# Patient Record
Sex: Male | Born: 1950
Health system: Southern US, Community
[De-identification: ages and names within clinical notes are randomized; demographics above are authoritative.]

## PROBLEM LIST (undated history)

## (undated) DIAGNOSIS — I82409 Acute embolism and thrombosis of unspecified deep veins of unspecified lower extremity: Secondary | ICD-10-CM

## (undated) DIAGNOSIS — I714 Abdominal aortic aneurysm, without rupture, unspecified: Secondary | ICD-10-CM

## (undated) DIAGNOSIS — E559 Vitamin D deficiency, unspecified: Secondary | ICD-10-CM

## (undated) DIAGNOSIS — I7101 Dissection of thoracic aorta: Principal | ICD-10-CM

## (undated) DIAGNOSIS — K219 Gastro-esophageal reflux disease without esophagitis: Secondary | ICD-10-CM

## (undated) DIAGNOSIS — J309 Allergic rhinitis, unspecified: Secondary | ICD-10-CM

## (undated) DIAGNOSIS — G540 Brachial plexus disorders: Secondary | ICD-10-CM

## (undated) DIAGNOSIS — E041 Nontoxic single thyroid nodule: Secondary | ICD-10-CM

## (undated) DIAGNOSIS — N529 Male erectile dysfunction, unspecified: Secondary | ICD-10-CM

## (undated) DIAGNOSIS — G43909 Migraine, unspecified, not intractable, without status migrainosus: Secondary | ICD-10-CM

## (undated) DIAGNOSIS — G4733 Obstructive sleep apnea (adult) (pediatric): Secondary | ICD-10-CM

## (undated) DIAGNOSIS — F419 Anxiety disorder, unspecified: Secondary | ICD-10-CM

## (undated) DIAGNOSIS — M1990 Unspecified osteoarthritis, unspecified site: Secondary | ICD-10-CM

## (undated) DIAGNOSIS — E119 Type 2 diabetes mellitus without complications: Secondary | ICD-10-CM

## (undated) DIAGNOSIS — D649 Anemia, unspecified: Secondary | ICD-10-CM

## (undated) DIAGNOSIS — J449 Chronic obstructive pulmonary disease, unspecified: Secondary | ICD-10-CM

## (undated) DIAGNOSIS — I10 Essential (primary) hypertension: Secondary | ICD-10-CM

## (undated) DIAGNOSIS — E785 Hyperlipidemia, unspecified: Secondary | ICD-10-CM

## (undated) HISTORY — DX: Dissection of thoracic aorta: I71.01

## (undated) HISTORY — DX: Vitamin D deficiency, unspecified: E55.9

## (undated) HISTORY — DX: Abdominal aortic aneurysm, without rupture: I71.4

## (undated) HISTORY — DX: Brachial plexus disorders: G54.0

## (undated) HISTORY — DX: Type 2 diabetes mellitus without complications: E11.9

## (undated) HISTORY — DX: Nontoxic single thyroid nodule: E04.1

## (undated) HISTORY — DX: Anxiety disorder, unspecified: F41.9

## (undated) HISTORY — DX: Male erectile dysfunction, unspecified: N52.9

## (undated) HISTORY — PX: OTHER SURGICAL HISTORY: SHX169

## (undated) HISTORY — DX: Abdominal aortic aneurysm, without rupture, unspecified: I71.40

## (undated) HISTORY — DX: Obstructive sleep apnea (adult) (pediatric): G47.33

## (undated) HISTORY — DX: Hyperlipidemia, unspecified: E78.5

## (undated) HISTORY — DX: Allergic rhinitis, unspecified: J30.9

## (undated) HISTORY — DX: Migraine, unspecified, not intractable, without status migrainosus: G43.909

---

## 2001-03-27 ENCOUNTER — Encounter: Admission: RE | Admit: 2001-03-27 | Discharge: 2001-03-27 | Payer: Self-pay | Admitting: Internal Medicine

## 2003-06-17 ENCOUNTER — Encounter: Admission: RE | Admit: 2003-06-17 | Discharge: 2003-06-17 | Payer: Self-pay | Admitting: Infectious Diseases

## 2006-02-27 ENCOUNTER — Emergency Department (HOSPITAL_COMMUNITY): Admission: EM | Admit: 2006-02-27 | Discharge: 2006-02-27 | Payer: Self-pay | Admitting: Family Medicine

## 2007-12-07 LAB — CONVERTED CEMR LAB
HCT: 46.1 %
Hemoglobin: 15.6 g/dL
MCV: 96 fL
RBC: 4.83 M/uL

## 2008-02-22 ENCOUNTER — Ambulatory Visit (HOSPITAL_BASED_OUTPATIENT_CLINIC_OR_DEPARTMENT_OTHER): Admission: RE | Admit: 2008-02-22 | Discharge: 2008-02-22 | Payer: Self-pay | Admitting: Internal Medicine

## 2008-02-22 ENCOUNTER — Encounter: Payer: Self-pay | Admitting: Neurology

## 2008-02-23 ENCOUNTER — Encounter: Payer: Self-pay | Admitting: Internal Medicine

## 2008-02-23 ENCOUNTER — Encounter: Payer: Self-pay | Admitting: Neurology

## 2008-03-02 ENCOUNTER — Ambulatory Visit: Payer: Self-pay | Admitting: Internal Medicine

## 2008-06-20 LAB — CONVERTED CEMR LAB
ALT: 41 units/L
AST: 28 units/L
BUN: 19 mg/dL
CO2: 22 meq/L
Chloride: 102 meq/L
Cholesterol: 243 mg/dL
HDL: 38 mg/dL
LDL (calc): 36 mg/dL
LDL Cholesterol: 169 mg/dL
Potassium: 4.8 meq/L
Total Protein: 7 g/dL
Triglyceride fasting, serum: 181 mg/dL

## 2008-12-17 ENCOUNTER — Encounter (INDEPENDENT_AMBULATORY_CARE_PROVIDER_SITE_OTHER): Payer: Self-pay | Admitting: *Deleted

## 2008-12-17 ENCOUNTER — Ambulatory Visit: Payer: Self-pay | Admitting: Internal Medicine

## 2008-12-17 DIAGNOSIS — G43909 Migraine, unspecified, not intractable, without status migrainosus: Secondary | ICD-10-CM | POA: Insufficient documentation

## 2008-12-17 DIAGNOSIS — R5383 Other fatigue: Secondary | ICD-10-CM

## 2008-12-17 DIAGNOSIS — J45909 Unspecified asthma, uncomplicated: Secondary | ICD-10-CM | POA: Insufficient documentation

## 2008-12-17 DIAGNOSIS — E785 Hyperlipidemia, unspecified: Secondary | ICD-10-CM

## 2008-12-17 DIAGNOSIS — Z9189 Other specified personal risk factors, not elsewhere classified: Secondary | ICD-10-CM | POA: Insufficient documentation

## 2008-12-17 DIAGNOSIS — G4733 Obstructive sleep apnea (adult) (pediatric): Secondary | ICD-10-CM | POA: Insufficient documentation

## 2008-12-17 DIAGNOSIS — N529 Male erectile dysfunction, unspecified: Secondary | ICD-10-CM | POA: Insufficient documentation

## 2008-12-17 DIAGNOSIS — E1169 Type 2 diabetes mellitus with other specified complication: Secondary | ICD-10-CM | POA: Insufficient documentation

## 2008-12-17 DIAGNOSIS — R5381 Other malaise: Secondary | ICD-10-CM | POA: Insufficient documentation

## 2008-12-17 DIAGNOSIS — J309 Allergic rhinitis, unspecified: Secondary | ICD-10-CM | POA: Insufficient documentation

## 2008-12-17 DIAGNOSIS — E559 Vitamin D deficiency, unspecified: Secondary | ICD-10-CM | POA: Insufficient documentation

## 2008-12-18 ENCOUNTER — Encounter (INDEPENDENT_AMBULATORY_CARE_PROVIDER_SITE_OTHER): Payer: Self-pay | Admitting: *Deleted

## 2008-12-18 LAB — CONVERTED CEMR LAB
Basophils Absolute: 0.1 10*3/uL (ref 0.0–0.1)
Eosinophils Absolute: 0.2 10*3/uL (ref 0.0–0.7)
Hemoglobin: 16.3 g/dL (ref 13.0–17.0)
Lymphocytes Relative: 33.5 % (ref 12.0–46.0)
Lymphs Abs: 2.7 10*3/uL (ref 0.7–4.0)
MCHC: 34.4 g/dL (ref 30.0–36.0)
Neutro Abs: 4.3 10*3/uL (ref 1.4–7.7)
RDW: 12.6 % (ref 11.5–14.6)

## 2009-01-17 ENCOUNTER — Ambulatory Visit: Payer: Self-pay | Admitting: Internal Medicine

## 2009-03-06 ENCOUNTER — Encounter: Payer: Self-pay | Admitting: Internal Medicine

## 2009-03-27 ENCOUNTER — Telehealth: Payer: Self-pay | Admitting: Internal Medicine

## 2009-07-18 ENCOUNTER — Telehealth: Payer: Self-pay | Admitting: Internal Medicine

## 2009-11-17 ENCOUNTER — Telehealth: Payer: Self-pay | Admitting: Internal Medicine

## 2010-01-02 ENCOUNTER — Ambulatory Visit: Payer: Self-pay | Admitting: Internal Medicine

## 2010-01-02 LAB — CONVERTED CEMR LAB: Vit D, 25-Hydroxy: 38 ng/mL (ref 30–89)

## 2010-01-05 LAB — CONVERTED CEMR LAB
ALT: 40 units/L (ref 0–53)
AST: 27 units/L (ref 0–37)
Albumin: 4.3 g/dL (ref 3.5–5.2)
BUN: 17 mg/dL (ref 6–23)
Basophils Relative: 0.5 % (ref 0.0–3.0)
Bilirubin Urine: NEGATIVE
CO2: 28 meq/L (ref 19–32)
Chloride: 105 meq/L (ref 96–112)
Cholesterol: 155 mg/dL (ref 0–200)
Creatinine, Ser: 0.9 mg/dL (ref 0.4–1.5)
Eosinophils Relative: 3.6 % (ref 0.0–5.0)
Hemoglobin, Urine: NEGATIVE
Hemoglobin: 15.6 g/dL (ref 13.0–17.0)
Ketones, ur: NEGATIVE mg/dL
LDL Cholesterol: 94 mg/dL (ref 0–99)
Lymphocytes Relative: 39.8 % (ref 12.0–46.0)
Monocytes Relative: 8.9 % (ref 3.0–12.0)
Neutro Abs: 3.8 10*3/uL (ref 1.4–7.7)
Nitrite: NEGATIVE
RBC: 4.66 M/uL (ref 4.22–5.81)
Total Bilirubin: 0.9 mg/dL (ref 0.3–1.2)
Total CHOL/HDL Ratio: 4
Total Protein, Urine: NEGATIVE mg/dL

## 2010-02-17 ENCOUNTER — Telehealth: Payer: Self-pay | Admitting: Internal Medicine

## 2010-03-20 ENCOUNTER — Telehealth: Payer: Self-pay | Admitting: Internal Medicine

## 2010-03-21 ENCOUNTER — Encounter: Payer: Self-pay | Admitting: Internal Medicine

## 2010-03-23 LAB — CONVERTED CEMR LAB
Direct LDL: 114 mg/dL — ABNORMAL HIGH
HDL: 41 mg/dL (ref >39)

## 2010-07-14 NOTE — Progress Notes (Signed)
Summary: lipid panell  Phone Note Call from Patient   Caller: Patient e-mail  Call For: Newt Lukes MD Summary of Call: MD recieved e-mail from pt stating I've been taking the red yeast supplement for over a month and nearing the end of the supply. Any chance i could get a lipid profile to see if HDL increased? would be great if he copuld go to the [facility] to have blood drawn.  Initial call taken by: Orlan Leavens RMA,  March 20, 2010 9:55 AM  Follow-up for Phone Call        MD states would be ok if able to place in IDX. will call pt to get more info Follow-up by: Orlan Leavens RMA,  March 20, 2010 9:55 AM  Additional Follow-up for Phone Call Additional follow up Details #1::        Pt call concerning getting order for lipid panel. [facility] does use EMR will put order in computer. dx 272.4 Additional Follow-up by: Orlan Leavens RMA,  March 20, 2010 10:27 AM     Appended Document: lipid panell    Clinical Lists Changes  Orders: Added new Test order of TLB-Cholesterol, HDL (83718-HDL) - Signed Added new Test order of TLB-Cholesterol, Direct LDL (83721-DIRLDL) - Signed

## 2010-07-14 NOTE — Assessment & Plan Note (Signed)
Summary: cpx / # / umr / # / cd   Vital Signs:  Patient profile:   60 year old male Height:      76 inches (193.04 cm) Weight:      241.12 pounds (109.60 kg) BMI:     29.46 O2 Sat:      94 % on Room air Temp:     98.3 degrees F (36.83 degrees C) oral Pulse rate:   74 / minute BP sitting:   122 / 94  (left arm) Cuff size:   large  Vitals Entered By: Orlan Leavens RMA (January 02, 2010 2:57 PM)  O2 Flow:  Room air CC: CPX Is Patient Diabetic? No Pain Assessment Patient in pain? no      Comments Pt states he already had EKG done already. Also discuss prednisone rx   Primary Care Provider:  Rene Paci, MD  CC:  CPX.  History of Present Illness: patient is here today for annual physical. Patient feels well and has no complaints.   reviewed other issues: sleep apnea - sleep study 9/09 with moderate sleep apnea reviewed. However, patient has had problems with CPAP -he still feels fatigued and tired during the day - naps when he gets home from work  seasonal allergies - reasonable control with current meds -takes his nasal spray, and allergy pills as rx'd. Occasional vertigo will be associated when ears are stopped from sinus problems. No sneezing or current recent symptoms.  epipens? - hx allergy to yellow jackets and would like supply of epi pen to have at home  dyslipidemia - prev on lovaza and liptior tx for same - currently taking expired sampes of vytroin tabs- 1/2 tab of 10/40 strength. stopped briefly, due to myalgias in neck, but these resolved spontaneously and since resuming statin medication, he has had no other muscle pain  vit d defic - level <20 6/09 when dx made. taking otc supplements as rx'd  Preventive Screening-Counseling & Management  Alcohol-Tobacco     Alcohol drinks/day: <1     Smoking Status: current     Tobacco Counseling: to quit use of tobacco products  Caffeine-Diet-Exercise     Nutrition Referrals: no     Exercise Counseling: to improve  exercise regimen  Safety-Violence-Falls     Seat Belt Counseling: not indicated; patient wears seat belts     Helmet Counseling: not indicated; patient wears helmet when riding bicycle/motocycle     Smoke Detectors: yes     Violence in the Home: no risk noted     Fall Risk Counseling: not indicated; no significant falls noted  Clinical Review Panels:  Immunizations   Last Tetanus Booster:  Historical (02/27/2006)   Last Flu Vaccine:  Historical (03/22/2008)   Last H1N1 Vaccine 1:  H1N1 vaccine G code (04/25/2008)   Last PPD Date Read:  04/29/2008 (12/17/2008)   Last PPD Result:  negative (12/17/2008)  Lipid Management   Cholesterol:  243 (06/20/2008)   LDL (bad choesterol):  169 (06/20/2008)   HDL (good cholesterol):  38 (06/20/2008)   Triglycerides:  181 (06/20/2008)  Diabetes Management   HgBA1C:  6.2 (06/20/2008)   Creatinine:  17 (06/20/2008)   Last Flu Vaccine:  Historical (03/22/2008)  CBC   WBC:  8.1 (12/17/2008)   RBC:  4.96 (12/17/2008)   Hgb:  16.3 (12/17/2008)   Hct:  47.4 (12/17/2008)   Platelets:  179.0 (12/17/2008)   MCV  95.5 (12/17/2008)   MCHC  34.4 (12/17/2008)   RDW  12.6 (  12/17/2008)   PMN:  54.0 (12/17/2008)   Lymphs:  33.5 (12/17/2008)   Monos:  9.5 (12/17/2008)   Eosinophils:  2.0 (12/17/2008)   Basophil:  1.0 (12/17/2008)  Complete Metabolic Panel   Glucose:  127 (06/20/2008)   Sodium:  138 (06/20/2008)   Potassium:  4.8 (06/20/2008)   Chloride:  102 (06/20/2008)   CO2:  22 (06/20/2008)   BUN:  19 (06/20/2008)   Creatinine:  17 (06/20/2008)   Albumin:  4.4 (06/20/2008)   Total Protein:  7 (06/20/2008)   Calcium:  9 (06/20/2008)   Total Bili:  1.2 (06/20/2008)   Alk Phos:  40 (06/20/2008)   SGPT (ALT):  41 (06/20/2008)   SGOT (AST):  28 (06/20/2008)   Current Medications (verified): 1)  Xanax 1 Mg Tabs (Alprazolam) .... Take 1/4 As Needed 2)  Sudophed 15 Mg Tabs (Pseudoephedrine Hcl) .... Take 1 By Mouth Qd 3)  Allegra 180 Mg Tabs  (Fexofenadine Hcl) .... Take 1 By Mouth Qd 4)  Singulair 10 Mg Tabs (Montelukast Sodium) .... Take 1 By Mouth Qd 5)  Flonase 50 Mcg/act Susp (Fluticasone Propionate) .... Use 1 Spray Each Nostril Prn 6)  Vytorin 10-40 Mg Tabs (Ezetimibe-Simvastatin) .... Take 1/2 By Mouth Qd 7)  Multivitamins  Tabs (Multiple Vitamin) .... Take 1 By Mouth Qd 8)  Vitamin D 1000 Unit Tabs (Cholecalciferol) .... Take 1 By Mouth Qd 9)  B Complex 100  Tabs (B Complex Vitamins) .... Take 1 By Mouth Qd 10)  Epipen 0.3 Mg/0.59ml (1:1000) Devi (Epinephrine Hcl (Anaphylaxis)) .... Use Prn 11)  Cialis 2.5 Mg Tabs (Tadalafil) .... Take 30 Minutes Prior To Sexual Encounter As Needed 12)  Lovaza 1 Gm Caps (Omega-3-Acid Ethyl Esters) .... Take 1 By Mouth Two Times A Day 13)  Cpap 14)  Prednisone 20 Mg Tabs (Prednisone) .... Take 1 By Mouth Daily As Needed For Asthma Flare-Up  Allergies (verified): No Known Drug Allergies  Past History:  Past Medical History: Allergic rhinitis Asthma Hyperlipidemia OSA- NPSG 02/23/08- 34/hr.  MD roster: pulm - young  Past Surgical History: Nasal fx without repair  Family History: Family History Diabetes 1st degree relative (mother) Hx Heart disease (cousins and uncles) Family History Lung cancer (2 cousins) dad died age 52y d/t PE   Social History: Current Smoker-12-15 cigs/day single,  divorced lives alone, prev g-friend x 6 years moved to [place] early 2011 works in internal med residency dept -Civil engineer, contracting at [employer]  Review of Systems       see HPI above. I have reviewed all other systems and they were negative.   Physical Exam  General:  alert, well-developed, well-nourished, and cooperative to examination.    Eyes:  vision grossly intact; pupils equal, round and reactive to light.  conjunctiva and lids normal.    Ears:  normal pinnae bilaterally, without erythema, swelling, or tenderness to palpation. TMs clear, without effusion, or cerumen impaction. Hearing grossly  normal bilaterally  Mouth:  teeth and gums in good repair; mucous membranes moist, without lesions or ulcers. oropharynx clear without exudate, no erythema.  Neck:  supple, full ROM, no masses, no thyromegaly; no thyroid nodules or tenderness. no JVD or carotid bruits.   Lungs:  normal respiratory effort, no intercostal retractions or use of accessory muscles; normal breath sounds bilaterally - no crackles and no wheezes.    Heart:  normal rate, regular rhythm, no murmur, and no rub. BLE without edema. Abdomen:  soft, non-tender, normal bowel sounds, no distention; no masses and  no appreciable hepatomegaly or splenomegaly.   Rectal:  defer Genitalia:  defer Prostate:  defer Msk:  No deformity or scoliosis noted of thoracic or lumbar spine.   Neurologic:  alert & oriented X3 and cranial nerves II-XII symetrically intact.  strength normal in all extremities, sensation intact to light touch, and gait normal. speech fluent without dysarthria or aphasia; follows commands with good comprehension.  Skin:  no rashes, vesicles, ulcers, or erythema. No nodules or irregularity to palpation.  Psych:  Oriented X3, memory intact for recent and remote, normally interactive, good eye contact, not anxious appearing, not depressed appearing, and not agitated.      Impression & Recommendations:  Problem # 1:  PREVENTIVE HEALTH CARE (ICD-V70.0) Patient has been counseled on age-appropriate routine health concerns for screening and prevention. These are reviewed and up-to-date. Immunizations are up-to-date or declined. Labs ordered and ECG declined (recent unoffical ekg done at work at "normal" per pt report) Orders: TLB-Lipid Panel (80061-LIPID) TLB-CBC Platelet - w/Differential (85025-CBCD) TLB-BMP (Basic Metabolic Panel-BMET) (80048-METABOL) TLB-TSH (Thyroid Stimulating Hormone) (84443-TSH) TLB-Hepatic/Liver Function Pnl (80076-HEPATIC) TLB-Udip w/ Micro (81001-URINE) TLB-PSA (Prostate Specific Antigen)  (84153-PSA)  Problem # 2:  UNSPECIFIED VITAMIN D DEFICIENCY (ICD-268.9)  Orders: T-Vitamin D (25-Hydroxy) (40981-19147)   Continue current recommended dose without change. Pending these test results  Problem # 3:  ALLERGIC RHINITIS (ICD-477.9)  His updated medication list for this problem includes:    Sudafed 30 Mg Tabs (Pseudoephedrine hcl) .[name]... 1/2-1 tab by mouth once daily    Allegra 180 Mg Tabs (Fexofenadine hcl) .[name]... Take 1 by mouth once daily    Flonase 50 Mcg/act Susp (Fluticasone propionate) ..... Use 1 spray each nostril prn  Tobacco makes this worse. Discussed available meds.  Complete Medication List: 1)  Xanax 1 Mg Tabs (Alprazolam) .... Take 1/4 as needed 2)  Sudafed 30 Mg Tabs (Pseudoephedrine hcl) .... 1/2-1 tab by mouth once daily 3)  Allegra 180 Mg Tabs (Fexofenadine hcl) .... Take 1 by mouth once daily 4)  Singulair 10 Mg Tabs (Montelukast sodium) .... Take 1 by mouth once daily 5)  Flonase 50 Mcg/act Susp (Fluticasone propionate) .... Use 1 spray each nostril prn 6)  Vytorin 10-40 Mg Tabs (Ezetimibe-simvastatin) .... Take 1/2 by mouth qd 7)  Multivitamins Tabs (Multiple vitamin) .... Take 1 by mouth qd 8)  Vitamin D 1000 Unit Tabs (Cholecalciferol) .... Take 1 by mouth qd 9)  B Complex 100 Tabs (B complex vitamins) .... Take 1 by mouth qd 10)  Epipen 0.3 Mg/0.67ml (1:1000) Devi (Epinephrine hcl (anaphylaxis)) .... Use prn 11)  Lovaza 1 Gm Caps (Omega-3-acid ethyl esters) .... Take 1 by mouth two times a day 12)  Cpap  13)  Prednisone 20 Mg Tabs (Prednisone) .... Take 1 by mouth daily as needed for asthma flare-up 14)  Tretinoin 0.1 % Crea (Tretinoin) .... Apply as needed to affected skin  Patient Instructions: 1)  it was good to see you today. 2)  test(s) ordered today - your results will be posted on the phone tree for review in 48-72 hours from the time of test completion; call [phone] and enter your 9 digit MRN (listed above on this page, just below  your name); if any changes need to be made or there are abnormal results, you will be contacted directly.  3)  refills on your medications as discussed - 4)  Please schedule a follow-up appointment annually, call sooner if problems.   Contraindications/Deferment of Procedures/Staging:    Test/Procedure: Colonoscopy  Reason for deferment: patient declined  Prescriptions: LOVAZA 1 GM CAPS (OMEGA-3-ACID ETHYL ESTERS) take 1 by mouth two times a day  #180 x 3   Entered and Authorized by:   Newt Lukes MD   Signed by:   Newt Lukes MD on 01/02/2010   Method used:   Electronically to        [facility]* (retail)       [address]       [address] Shipping/mailing       [place], [place]  [address]       Ph: [phone]       Fax: [phone]   RxID:   [id number] SINGULAIR 10 MG TABS (MONTELUKAST SODIUM) take 1 by mouth once daily  #90 x 3   Entered and Authorized by:   Newt Lukes MD   Signed by:   Newt Lukes MD on 01/02/2010   Method used:   Electronically to        [facility]* (retail)       [address]       [address] Shipping/mailing       [place], [place]  [address]       Ph: [phone]       Fax: [phone]   RxID:   [id number] ALLEGRA 180 MG TABS (FEXOFENADINE HCL) take 1 by mouth once daily  #90 x 3   Entered and Authorized by:   Newt Lukes MD   Signed by:   Newt Lukes MD on 01/02/2010   Method used:   Electronically to        [facility]* (retail)       [address]       [address] Shipping/mailing       [place], [place]  [address]       Ph: [phone]       Fax: [phone]   RxID:   [id number] TRETINOIN 0.1 % CREA (TRETINOIN) apply as needed to affected skin  #20g x 1   Entered and Authorized by:   Newt Lukes MD   Signed by:   Newt Lukes MD on 01/02/2010   Method used:   Electronically to        [facility]* (retail)       [address]       [address] Shipping/mailing       [place], [place]  [address]       Ph: [phone]       Fax: [phone]   RxID:   [id number]

## 2010-07-14 NOTE — Progress Notes (Signed)
Summary: prednisone  Phone Note From Pharmacy   Caller: [facility]* Summary of Call: Pt is requesting refill on Prednisone 20mg  # 15 for asthma flare up. Was rx by Dr. Lina Sayre. Dr. Felicity Coyer is this ok to refill? [facility] Initial call taken by: Orlan Leavens,  July 18, 2009 11:23 AM  Follow-up for Phone Call        yes - ok to fill Follow-up by: Newt Lukes MD,  July 18, 2009 11:40 AM  Additional Follow-up for Phone Call Additional follow up Details #1::        sent to [facility] Additional Follow-up by: Orlan Leavens,  July 18, 2009 11:47 AM    New/Updated Medications: PREDNISONE 20 MG TABS (PREDNISONE) take 1 by mouth daily as needed for asthma flare-up Prescriptions: PREDNISONE 20 MG TABS (PREDNISONE) take 1 by mouth daily as needed for asthma flare-up  #15 x 0   Entered by:   Orlan Leavens   Authorized by:   Newt Lukes MD   Signed by:   Orlan Leavens on 07/18/2009   Method used:   Electronically to        [facility]* (retail)       [address]       [address] Shipping/mailing       [place], [place]  [address]       Ph: [phone]       Fax: [phone]   RxID:   [id number]

## 2010-07-14 NOTE — Progress Notes (Signed)
Summary: flonase & singualr  Phone Note Refill Request Message from:  Fax from Pharmacy on November 17, 2009 11:53 AM  Refills Requested: Medication #1:  FLONASE 50 MCG/ACT SUSP use 1 spray each nostril prn  Medication #2:  SINGULAIR 10 MG TABS take 1 by mouth qd Initial call taken by: Orlan Leavens,  November 17, 2009 11:54 AM    Prescriptions: SINGULAIR 10 MG TABS (MONTELUKAST SODIUM) take 1 by mouth qd  #90 x 0   Entered by:   Orlan Leavens   Authorized by:   Newt Lukes MD   Signed by:   Orlan Leavens on 11/17/2009   Method used:   Electronically to        [facility]* (retail)       [address]       [address] Shipping/mailing       [place], [place]  [address]       Ph: [phone]       Fax: [phone]   RxID:   [id number] FLONASE 50 MCG/ACT SUSP (FLUTICASONE PROPIONATE) use 1 spray each nostril prn  #16 x 0   Entered by:   Orlan Leavens   Authorized by:   Newt Lukes MD   Signed by:   Orlan Leavens on 11/17/2009   Method used:   Electronically to        [facility]* (retail)       [address]       [address] Shipping/mailing       [place], [place]  [address]       Ph: [phone]       Fax: [phone]   RxID:   [id number]

## 2010-07-14 NOTE — Progress Notes (Signed)
Summary: flonase & prednisone  Phone Note Refill Request   Refills Requested: Medication #1:  PREDNISONE 20 MG TABS take 1 by mouth daily as needed for asthma flare-up  Medication #2:  FLONASE 50 MCG/ACT SUSP use 1 spray each nostril prn [facility] [phone]   Method Requested: Electronic Initial call taken by: Orlan Leavens RMA,  February 17, 2010 9:57 AM    Prescriptions: PREDNISONE 20 MG TABS (PREDNISONE) take 1 by mouth daily as needed for asthma flare-up  #15 x 0   Entered by:   Orlan Leavens RMA   Authorized by:   Newt Lukes MD   Signed by:   Orlan Leavens RMA on 02/17/2010   Method used:   Electronically to        [facility]* (retail)       [address]       [address] Shipping/mailing       [place], [place]  [address]       Ph: [phone]       Fax: [phone]   RxID:   [id number] FLONASE 50 MCG/ACT SUSP (FLUTICASONE PROPIONATE) use 1 spray each nostril prn  #16 Each x 0   Entered by:   Orlan Leavens RMA   Authorized by:   Newt Lukes MD   Signed by:   Orlan Leavens RMA on 02/17/2010   Method used:   Electronically to        [facility]* (retail)       [address]       [address] Shipping/mailing       [place], [place]  [address]       Ph: [phone]       Fax: [phone]   RxID:   [id number]

## 2010-09-04 ENCOUNTER — Other Ambulatory Visit: Payer: Self-pay | Admitting: Internal Medicine

## 2010-10-30 ENCOUNTER — Other Ambulatory Visit: Payer: Self-pay | Admitting: Internal Medicine

## 2010-10-30 NOTE — Procedures (Signed)
NAME:  [name], DOWIS NO.:  [id number]   MEDICAL RECORD NO.:  [id number]          PATIENT TYPE:  OUT   LOCATION:  SLEEP CENTER                 FACILITY:  [facility]   PHYSICIAN:  Clinton D. Maple Hudson, MD, FCCP, FACPDATE OF BIRTH:  Mar 27, 1953   DATE OF STUDY:  02/23/2008                            NOCTURNAL POLYSOMNOGRAM   REFERRING PHYSICIAN:   REFERRING PHYSICIAN:  Merlene Laughter. Renae Gloss, M.D.   INDICATION FOR STUDY:  Insomnia with sleep apnea.   EPWORTH SLEEPINESS SCORE:  5/24.   BMI:  29.8.   WEIGHT:  245 pounds.   HEIGHT:  76 inches.   NECK:  17 inches.   HOME MEDICATIONS:  Charted and reviewed, noted to include  pseudoephedrine and prednisone, both of which may cause nocturnal  stimulation.   SLEEP ARCHITECTURE:  Total sleep time 263 minutes with sleep efficiency  65.8%.  Stage I was 13.3%.  Stage II 78.8%.  Stage III absent.  REM 8%  of total sleep time.  Sleep latency 70 minutes.  REM latency 115  minutes.  Awake after sleep onset 68.5 minutes.  Arousal index 68.7,  indicating increased EEG arousal.  No bedtime medication was taken.  Sleep profile was marked by sleep onset at midnight and frequent brief  arousals throughout the night, consistent with history.   RESPIRATORY DATA:  Apnea-hypopnea index (AHI) 34 per hour.  A total of  149 events were scored, including 69 obstructive apneas, 2 central  apneas, 3 mixed apneas, and 75 hypopneas.  Respiratory Disturbance Index  (RDI) 66.4%, reflecting a total of 142 respiratory events related to  arousal, not meeting standard scoring criteria for apneas and hypopneas.  He was unable to sustain sufficient sleep before 2:00 a.m. to meet  protocol for split study CPAP titration by protocol on this study night.   OXYGEN DATA:  Loud snoring with oxygen desaturation to a nadir of 89%.  Mean oxygen saturation through the study was 94% on room air.  A total  of 9.1 minutes was spent with saturation less than 88% on  room air.   CARDIAC DATA:  Sinus rhythm with occasional PACs.   MOVEMENT-PARASOMNIA:  No significant movement disturbance.  Bathroom x1.   IMPRESSIONS-RECOMMENDATIONS:  1. Moderate obstructive sleep apnea/hypopnea syndrome, apnea-hypopnea      index (AHI) 34 per hour with most events while nonsupine, very loud      snoring with oxygen desaturation to a nadir of 89%.  2. Fragmented sleep pattern reflecting frequent brief wakings,      consistent with patient's history and likely related to repeated      respiratory arousals.  3. He was unable to sustain sleep as required to meet the CPAP      titration split-night protocol, but scores in this range would      qualify for CPAP intervention.  Consider return for CPAP titration      if appropriate or otherwise consider for alternative management, as      clinically indicated.      Clinton D. Maple Hudson, MD, FCCP, FACP  Diplomate, Biomedical engineer of Sleep Medicine  Electronically Signed     CDY/MEDQ  D:  03/02/2008 10:31:41  T:  03/02/2008 14:32:05  Job:  [id number]

## 2011-02-08 ENCOUNTER — Other Ambulatory Visit: Payer: Self-pay | Admitting: Internal Medicine

## 2011-02-10 ENCOUNTER — Encounter: Payer: Self-pay | Admitting: Internal Medicine

## 2011-02-11 ENCOUNTER — Encounter: Payer: Self-pay | Admitting: Internal Medicine

## 2011-02-11 ENCOUNTER — Other Ambulatory Visit (INDEPENDENT_AMBULATORY_CARE_PROVIDER_SITE_OTHER): Payer: Self-pay

## 2011-02-11 ENCOUNTER — Ambulatory Visit (INDEPENDENT_AMBULATORY_CARE_PROVIDER_SITE_OTHER): Payer: 59 | Admitting: Internal Medicine

## 2011-02-11 VITALS — BP 130/100 | HR 75 | Temp 98.5°F | Ht 76.0 in | Wt 242.8 lb

## 2011-02-11 DIAGNOSIS — E559 Vitamin D deficiency, unspecified: Secondary | ICD-10-CM

## 2011-02-11 DIAGNOSIS — N529 Male erectile dysfunction, unspecified: Secondary | ICD-10-CM

## 2011-02-11 DIAGNOSIS — Z23 Encounter for immunization: Secondary | ICD-10-CM

## 2011-02-11 DIAGNOSIS — Z0000 Encounter for general adult medical examination without abnormal findings: Secondary | ICD-10-CM

## 2011-02-11 DIAGNOSIS — K1370 Unspecified lesions of oral mucosa: Secondary | ICD-10-CM

## 2011-02-11 DIAGNOSIS — E291 Testicular hypofunction: Secondary | ICD-10-CM | POA: Insufficient documentation

## 2011-02-11 DIAGNOSIS — Z136 Encounter for screening for cardiovascular disorders: Secondary | ICD-10-CM

## 2011-02-11 LAB — BASIC METABOLIC PANEL
BUN: 19 mg/dL (ref 6–23)
CO2: 24 mEq/L (ref 19–32)
Calcium: 8.7 mg/dL (ref 8.4–10.5)
Creatinine, Ser: 0.9 mg/dL (ref 0.4–1.5)
GFR: 97.16 mL/min (ref >60.00)
Glucose, Bld: 162 mg/dL — ABNORMAL HIGH (ref 70–99)
Sodium: 139 mEq/L (ref 135–145)

## 2011-02-11 LAB — CBC WITH DIFFERENTIAL/PLATELET
Basophils Absolute: 0 10*3/uL (ref 0.0–0.1)
Eosinophils Absolute: 0.1 10*3/uL (ref 0.0–0.7)
Lymphocytes Relative: 25.4 % (ref 12.0–46.0)
MCHC: 33.6 g/dL (ref 30.0–36.0)
Monocytes Relative: 8.5 % (ref 3.0–12.0)
Neutrophils Relative %: 64 % (ref 43.0–77.0)
Platelets: 175 10*3/uL (ref 150.0–400.0)
RDW: 13.3 % (ref 11.5–14.6)

## 2011-02-11 LAB — URINALYSIS
Leukocytes, UA: NEGATIVE
Nitrite: NEGATIVE
Specific Gravity, Urine: 1.025 (ref 1.000–1.030)
Urobilinogen, UA: 0.2 (ref 0.0–1.0)
pH: 6 (ref 5.0–8.0)

## 2011-02-11 LAB — HEPATIC FUNCTION PANEL
ALT: 57 U/L — ABNORMAL HIGH (ref 0–53)
AST: 37 U/L (ref 0–37)
Alkaline Phosphatase: 32 U/L — ABNORMAL LOW (ref 39–117)
Bilirubin, Direct: 0.2 mg/dL (ref 0.0–0.3)
Total Bilirubin: 0.9 mg/dL (ref 0.3–1.2)

## 2011-02-11 LAB — TSH: TSH: 0.76 u[IU]/mL (ref 0.35–5.50)

## 2011-02-11 LAB — LIPID PANEL: HDL: 44.5 mg/dL (ref >39.00)

## 2011-02-11 LAB — TESTOSTERONE: Testosterone: 240.62 ng/dL — ABNORMAL LOW (ref 350.00–890.00)

## 2011-02-11 MED ORDER — AZITHROMYCIN 250 MG PO TABS: ORAL_TABLET | ORAL | Status: DC

## 2011-02-11 MED ORDER — ALPRAZOLAM 1 MG PO TABS: 1.0000 mg | ORAL_TABLET | Freq: Every evening | ORAL | Status: DC | PRN

## 2011-02-11 MED ORDER — VITAMIN D 1000 UNITS PO TABS: 2000.0000 [IU] | ORAL_TABLET | Freq: Every day | ORAL | Status: DC

## 2011-02-11 NOTE — Progress Notes (Signed)
Subjective:    Patient ID: [name], male    DOB: 1951/01/03, 60 y.o.   MRN: [id number]  HPI patient is here today for annual physical. Patient feels well and has no complaints.   Also reviewed other chronic issues:  sleep apnea - sleep study 9/09 with moderate sleep apnea reviewed. However, patient had problems with CPAP -he still feels fatigued and tired during the day - naps frequently  seasonal allergies - reasonable control with current meds -takes his nasal spray, and allergy pills as rx'd. Occasional vertigo will be associated when ears are stopped from sinus problems. No sneezing or current recent symptoms - hx allergy to yellow jackets and keeps supply of epi pen at home   dyslipidemia - prev on lovaza and lipitor tx for same - currently taking sampes of vytroin tabs- 1/2 tab of 10/40 strength. no muscle pain   vit d defic - level <20 6/09 when dx made. taking otc supplements as rx'd - normal level 2011 check  Past Medical History  Diagnosis Date  . OBSTRUCTIVE SLEEP APNEA 02/2008 sleep study  . MIGRAINE HEADACHE   . ERECTILE DYSFUNCTION, ORGANIC   . Unspecified vitamin D deficiency   . HYPERLIPIDEMIA   . ASTHMA   . ALLERGIC RHINITIS    Family History  Problem Relation Age of Onset  . Diabetes Mother   . Heart disease Maternal Uncle   . Heart disease Paternal Uncle   . Heart disease Cousin   . Lung cancer Cousin     2 cousins   History  Substance Use Topics  . Smoking status: Current Everyday Smoker  . Smokeless tobacco: Not on file   Comment: Single,divorced- lives alone, prev g-friend x 6 years moved to [facility] early 2011. retired  . Alcohol Use: No    Review of Systems Constitutional: Negative for fever.  Respiratory: Negative for cough and shortness of breath.   Cardiovascular: Negative for chest pain.  Gastrointestinal: Negative for abdominal pain.  Musculoskeletal: Negative for gait problem.  Skin: Negative for rash.  Neurological: Negative for  dizziness.  No other specific complaints in a complete review of systems (except as listed in HPI above).     Objective:   Physical Exam BP 130/100  Pulse 75  Temp(Src) 98.5 F (36.9 C) (Oral)  Ht 6\' 4"  (1.93 m)  Wt 242 lb 12.8 oz (110.133 kg)  BMI 29.55 kg/m2  SpO2 97% Wt Readings from Last 3 Encounters:  02/11/11 242 lb 12.8 oz (110.133 kg)  01/02/10 241 lb 1.9 oz (109.371 kg)  01/17/09 236 lb (107.049 kg)   Constitutional:  oriented to person, place, and time. appears well-developed and well-nourished. No distress.  HENT: NCAT, TMs clear bilaterally, no erythema or effusion; OP mod erythema; sm white 1mm nodule lesion to right on underside of tongue Eyes: corrected lenses - PERLL EOMI, no icterus or conjunctivitis Neck: Normal range of motion. Neck supple. No JVD, LAD or carotid bruits present. No thyromegaly present.  Cardiovascular: Normal rate, regular rhythm and normal heart sounds.  No murmur heard. no BLE edema Pulmonary/Chest: Effort normal and breath sounds normal. No respiratory distress. no wheezes.  Abdominal: Soft. Bowel sounds are normal. no distension. There is no tenderness. No mass  GU: defer at pt pref Musculoskeletal: No gross deformities, no joint effusions, full normal range of motion.  Neurological: he is alert and oriented to person, place, and time. No cranial nerve deficit. Coordination/speech/balance normal.  Skin: Skin is warm and dry.  No  erythema or ulceration. senile and solar changes without suspicious charcteristics Psychiatric: he has a normal mood and affect. behavior is normal. Judgment and thought content normal.   Lab Results  Component Value Date   WBC 8.0 01/02/2010   HGB 15.6 01/02/2010   HCT 44.2 01/02/2010   PLT 187.0 01/02/2010   CHOL 162 03/21/2010   TRIG 118.0 01/02/2010   HDL 41 03/21/2010   LDLDIRECT 114* 03/21/2010   ALT 40 01/02/2010   AST 27 01/02/2010   NA 141 01/02/2010   K 4.5 01/02/2010   CL 105 01/02/2010   CREATININE 0.9  01/02/2010   BUN 17 01/02/2010   CO2 28 01/02/2010   TSH 1.15 01/02/2010   PSA 0.47 01/02/2010   HGBA1C 6.2 06/20/2008    EKG today: sinus at 72bpm, LAFB     Assessment & Plan:  CPX - v70.0 - Patient has been counseled on age-appropriate routine health concerns for screening and prevention. These are reviewed and up-to-date or declined (colo). Immunizations are up-to-date or declined. Labs ordered and ECG reviewed.  Also see problem list. Medications and labs reviewed today.  Tongue lesion, smoker - refer to oral surg for opinion on same as pt believes "white spot" getting larger

## 2011-02-11 NOTE — Assessment & Plan Note (Signed)
Requests testosterone check due to fatigue- consider replacement if low

## 2011-02-11 NOTE — Patient Instructions (Addendum)
It was good to see you today. EKG, exam looks good, Medications reviewed, no changes at this time. Refill on medication(s) as discussed today.  Test(s) ordered today. Your results will be called to you after review (48-72hours after test completion). If any changes need to be made, you will be notified at that time. we'll make referral to oral surgeon for your tongue spot. Our office will contact you regarding appointment(s) once made. Please schedule followup in 1 years, call sooner if problems.

## 2011-02-16 ENCOUNTER — Other Ambulatory Visit: Payer: Self-pay | Admitting: Internal Medicine

## 2011-02-16 DIAGNOSIS — E291 Testicular hypofunction: Secondary | ICD-10-CM

## 2011-02-16 MED ORDER — TESTOSTERONE 50 MG/5GM (1%) TD GEL: 5.0000 g | Freq: Every day | TRANSDERMAL | Status: DC

## 2011-05-03 ENCOUNTER — Other Ambulatory Visit: Payer: Self-pay | Admitting: Internal Medicine

## 2011-07-30 ENCOUNTER — Other Ambulatory Visit: Payer: Self-pay | Admitting: *Deleted

## 2011-07-30 MED ORDER — PREDNISONE 10 MG PO TABS: ORAL_TABLET | ORAL | Status: DC

## 2011-07-30 MED ORDER — TRETINOIN 0.1 % EX CREA: TOPICAL_CREAM | CUTANEOUS | Status: DC

## 2011-07-30 NOTE — Telephone Encounter (Signed)
R'cd fax from [facility] for refill of Prednisone.

## 2011-07-30 NOTE — Telephone Encounter (Signed)
R cd fax from [facility] for refill.

## 2011-08-02 ENCOUNTER — Other Ambulatory Visit: Payer: Self-pay | Admitting: *Deleted

## 2011-08-02 MED ORDER — PREDNISONE 10 MG PO TABS: ORAL_TABLET | ORAL | Status: DC

## 2011-10-08 ENCOUNTER — Telehealth: Payer: Self-pay

## 2011-10-08 NOTE — Telephone Encounter (Signed)
Pt called stating that he has been having BRB ("nickel sized)"with abd cramps since yesterday afternoon. Pt has had 2 BMs since pain began.  Pt was advised to go to ER for evaluation but declined stating that "Dr Maurice March" where he worked advised that it could likely waiting until Monday.  Pt also asked if VAL would have any role in his care if he was seen in the ER over the weekend. Pt was advised that Dr Felicity Coyer sees outpatients. Pt replied "oh, so she just doesn't care". Pt was advised in detail about our call-a-nurse service. He states he will call back PRN.

## 2011-10-22 ENCOUNTER — Other Ambulatory Visit: Payer: Self-pay | Admitting: *Deleted

## 2011-10-22 MED ORDER — NITROGLYCERIN 0.4 MG SL SUBL: 0.4000 mg | SUBLINGUAL_TABLET | SUBLINGUAL | Status: DC | PRN

## 2011-12-23 ENCOUNTER — Encounter: Payer: Self-pay | Admitting: Internal Medicine

## 2011-12-23 ENCOUNTER — Ambulatory Visit (INDEPENDENT_AMBULATORY_CARE_PROVIDER_SITE_OTHER): Payer: 59 | Admitting: Internal Medicine

## 2011-12-23 VITALS — BP 162/104 | HR 86 | Temp 96.6°F | Ht 76.0 in | Wt 244.0 lb

## 2011-12-23 DIAGNOSIS — K922 Gastrointestinal hemorrhage, unspecified: Secondary | ICD-10-CM

## 2011-12-23 LAB — CBC
HCT: 46.1 % (ref 39.0–52.0)
Hemoglobin: 16.3 g/dL (ref 13.0–17.0)
MCH: 32.5 pg (ref 26.0–34.0)
MCHC: 35.4 g/dL (ref 30.0–36.0)
MCV: 92 fL (ref 78.0–100.0)
RBC: 5.01 MIL/uL (ref 4.22–5.81)

## 2011-12-23 NOTE — Progress Notes (Signed)
50 man, patient of Rene Paci, walks in for recurrent GI bleeding.  No GI dx.  Three months ago had a short illness with diarrhea and small amount of BRB per rectum -- no medical investigation at that time.  Two days ago, awoke with loewr abd crampy pain, diarrhea and BRBPR.  Initial bleed was just spot on paper but recurrence was larger amount of bright red liquid blood.  Has lower abd pain, mild.  Minimal to no abd tenderness now.  Conjunctivae fully injected.  BP up slightly.  No fever.  Has never had colonoscopy.  No family hx of colon cancer.  Usual medical home is Dr. Rene Paci but was not able to get app't today.  Imp:  Possible diverticulosis with recurrent pain and recurrent minor bleeding.  Doubt acute diverticulitis.. May be just indigestion (i.e., colitis) with hemorrhoidal bleeding.  Rx:   Stat CBC.  Advised him to make app't with his PCP for next few days.  Advised colonoscopy, both for this episode and for cancer screening.  Assuming hemoglobin is OK now, advised him to return, to ER if necessary, for recurrent bleed, fever, severs pain, dizzyness.

## 2011-12-27 ENCOUNTER — Other Ambulatory Visit: Payer: Self-pay | Admitting: Internal Medicine

## 2011-12-29 ENCOUNTER — Encounter: Payer: 59 | Admitting: Internal Medicine

## 2012-01-27 ENCOUNTER — Other Ambulatory Visit: Payer: Self-pay | Admitting: Internal Medicine

## 2012-02-16 ENCOUNTER — Encounter: Payer: Self-pay | Admitting: Internal Medicine

## 2012-02-16 ENCOUNTER — Ambulatory Visit (INDEPENDENT_AMBULATORY_CARE_PROVIDER_SITE_OTHER): Payer: 59 | Admitting: Internal Medicine

## 2012-02-16 ENCOUNTER — Other Ambulatory Visit (INDEPENDENT_AMBULATORY_CARE_PROVIDER_SITE_OTHER): Payer: 59

## 2012-02-16 VITALS — BP 152/90 | HR 72 | Temp 97.9°F | Ht 76.0 in | Wt 242.1 lb

## 2012-02-16 DIAGNOSIS — Z0000 Encounter for general adult medical examination without abnormal findings: Secondary | ICD-10-CM

## 2012-02-16 DIAGNOSIS — J309 Allergic rhinitis, unspecified: Secondary | ICD-10-CM

## 2012-02-16 DIAGNOSIS — E785 Hyperlipidemia, unspecified: Secondary | ICD-10-CM

## 2012-02-16 DIAGNOSIS — Z1211 Encounter for screening for malignant neoplasm of colon: Secondary | ICD-10-CM

## 2012-02-16 DIAGNOSIS — K625 Hemorrhage of anus and rectum: Secondary | ICD-10-CM

## 2012-02-16 LAB — BASIC METABOLIC PANEL
Calcium: 9.3 mg/dL (ref 8.4–10.5)
Creatinine, Ser: 0.9 mg/dL (ref 0.4–1.5)
GFR: 96.82 mL/min (ref >60.00)

## 2012-02-16 LAB — CBC WITH DIFFERENTIAL/PLATELET
Basophils Relative: 0.5 % (ref 0.0–3.0)
Eosinophils Absolute: 0.2 10*3/uL (ref 0.0–0.7)
Eosinophils Relative: 2.2 % (ref 0.0–5.0)
Lymphocytes Relative: 33.7 % (ref 12.0–46.0)
Neutrophils Relative %: 53.7 % (ref 43.0–77.0)
RBC: 4.76 Mil/uL (ref 4.22–5.81)
WBC: 7.7 10*3/uL (ref 4.5–10.5)

## 2012-02-16 LAB — LIPID PANEL
Cholesterol: 181 mg/dL (ref 0–200)
Triglycerides: 162 mg/dL — ABNORMAL HIGH (ref 0.0–149.0)

## 2012-02-16 LAB — HEPATIC FUNCTION PANEL
AST: 35 U/L (ref 0–37)
Albumin: 4.2 g/dL (ref 3.5–5.2)
Alkaline Phosphatase: 34 U/L — ABNORMAL LOW (ref 39–117)
Total Protein: 7.3 g/dL (ref 6.0–8.3)

## 2012-02-16 MED ORDER — EPINEPHRINE 0.3 MG/0.3ML IJ DEVI: 0.3000 mg | Freq: Once | INTRAMUSCULAR | Status: DC

## 2012-02-16 MED ORDER — AZITHROMYCIN 250 MG PO TABS: ORAL_TABLET | ORAL | Status: DC

## 2012-02-16 MED ORDER — FLUTICASONE PROPIONATE 50 MCG/ACT NA SUSP: 2.0000 | Freq: Every day | NASAL | Status: DC

## 2012-02-16 MED ORDER — ROSUVASTATIN CALCIUM 10 MG PO TABS: 10.0000 mg | ORAL_TABLET | Freq: Every day | ORAL | Status: DC

## 2012-02-16 NOTE — Assessment & Plan Note (Signed)
Check lipids Change vytorin to crestor and titrate as needed to keep LDL<100

## 2012-02-16 NOTE — Progress Notes (Signed)
Subjective:    Patient ID: [name], male    DOB: [birth year], 61 y.o.   MRN: [id number]  HPI  patient is here today for annual physical. Patient feels well overall.   Also reviewed other chronic issues:  sleep apnea - sleep study 9/09 with moderate sleep apnea reviewed. However, patient had problems with CPAP - still feels fatigued and tired during the day - naps frequently  seasonal allergies - reasonable control with current meds -takes his nasal spray, and allergy pills as rx'd. Occasional vertigo will be associated when ears are stopped from sinus problems. No sneezing or current recent symptoms - hx allergy to yellow jackets and keeps supply of epi pen at home   dyslipidemia - prev on lovaza and lipitor tx for same, intol of simva solo - currently taking sampes of vytroin tabs- 1/2 tab of 10/40 strength. no muscle pain   vit d defic - level <20 6/09 when dx made. taking otc supplements as rx'd - normal level 2011 check  2 episodes BRBPR 09/2011 and 12/2011 - painless bleeding initially but then associated with abdominal pain, cramping and diarrhea - symptoms awoke from sleep each time  Past Medical History  Diagnosis Date  . OBSTRUCTIVE SLEEP APNEA 02/2008 sleep study  . MIGRAINE HEADACHE   . ERECTILE DYSFUNCTION, ORGANIC   . Unspecified vitamin D deficiency   . HYPERLIPIDEMIA   . ASTHMA   . ALLERGIC RHINITIS    Family History  Problem Relation Age of Onset  . Diabetes Mother   . Heart disease Maternal Uncle   . Heart disease Paternal Uncle   . Heart disease Cousin   . Lung cancer Cousin     2 cousins   History  Substance Use Topics  . Smoking status: Current Everyday Smoker -- 0.1 packs/day  . Smokeless tobacco: Not on file   Comment: 10 cigs/day  . Alcohol Use: Yes     Rarely    Review of Systems Constitutional: Negative for fever or weight change.  Respiratory: Negative for cough and shortness of breath.   Cardiovascular: Negative for chest pain or  palpitations.  Gastrointestinal: Negative for abdominal pain, no bowel changes.  Musculoskeletal: Negative for gait problem or joint swelling.  Skin: Negative for rash.  Neurological: Negative for dizziness or headache.  No other specific complaints in a complete review of systems (except as listed in HPI above).      Objective:   Physical Exam  BP 152/90  Pulse 72  Temp 97.9 F (36.6 C) (Oral)  Ht 6\' 4"  (1.93 m)  Wt 242 lb 1.9 oz (109.825 kg)  BMI 29.47 kg/m2  SpO2 96% Wt Readings from Last 3 Encounters:  02/16/12 242 lb 1.9 oz (109.825 kg)  12/23/11 244 lb (110.678 kg)  02/11/11 242 lb 12.8 oz (110.133 kg)   Constitutional:  He appears well-developed and well-nourished. No distress.  HENT: NCAT, TMs clear without erythema or effusion - OP clear, unchanged tiny white lesion right side of posterior tongue.  Eyes: wears corr lenses - PERRL - no jaundice or icterus Neck: Normal range of motion. Neck supple. No JVD or LAD present. No thyromegaly present.  Cardiovascular: Normal rate, regular rhythm and normal heart sounds.  No murmur heard. no BLE edema Pulmonary/Chest: Effort normal and breath sounds normal. No respiratory distress. no wheezes.  Abdominal: Soft. Bowel sounds are normal. Patient exhibits no distension. There is no tenderness.  Musculoskeletal: Normal range of motion. Patient exhibits no gross deformity.  Neurological:  he is alert and oriented to person, place, and time. No cranial nerve deficit. Coordination normal.  Skin: Skin is warm and dry.  No erythema or ulceration.  Psychiatric: he has a normal mood and affect. behavior is normal. Judgment and thought content normal.    Lab Results  Component Value Date   WBC 10.7* 12/23/2011   HGB 16.3 12/23/2011   HCT 46.1 12/23/2011   PLT 174 12/23/2011   CHOL 166 02/11/2011   TRIG 59.0 02/11/2011   HDL 44.50 02/11/2011   LDLDIRECT 114* 03/21/2010   ALT 57* 02/11/2011   AST 37 02/11/2011   NA 139 02/11/2011   K 4.4  02/11/2011   CL 107 02/11/2011   CREATININE 0.9 02/11/2011   BUN 19 02/11/2011   CO2 24 02/11/2011   TSH 0.76 02/11/2011   PSA 0.47 01/02/2010   HGBA1C 6.2 06/20/2008    EKG today: sinus at 79 bpm, LAFB (unchanged from 01/2011)     Assessment & Plan:   CPX - v70.0 - Patient has been counseled on age-appropriate routine health concerns for screening and prevention. These are reviewed and up-to-date or declined (colo). Immunizations are up-to-date or declined. Labs ordered and ECG reviewed.  BRBPR - 09/2011 and 12/2011 self limited episodes - no anemia - suspected diverticular - refer for colo as for screening to eval same  Also see problem list. Medications and labs reviewed today.

## 2012-02-16 NOTE — Assessment & Plan Note (Signed)
Continue max med tx, pred pak prn Advised on tobacco cessation

## 2012-02-16 NOTE — Patient Instructions (Addendum)
It was good to see you today. Health Maintenance reviewed - will refer for colon screening, all other recommended immunizations and age-appropriate screenings are up-to-date. EKG unchanged and exam looks good. Medications reviewed - will change Vytorin to Crestor, no other changes at this time. Refill on medication(s) as discussed today.  Test(s) ordered today. Your results will be called to you after review (48-72hours after test completion). If any changes need to be made, you will be notified at that time. we'll make referral to Drs Laural Benes or Buccini for colonscopy. Our office will contact you regarding appointment(s) once made. Please schedule followup in 1 year, call sooner if problems.  Health Maintenance, Males A healthy lifestyle and preventative care can promote health and wellness.  Maintain regular health, dental, and eye exams.   Eat a healthy diet. Foods like vegetables, fruits, whole grains, low-fat dairy products, and lean protein foods contain the nutrients you need without too many calories. Decrease your intake of foods high in solid fats, added sugars, and salt. Get information about a proper diet from your caregiver, if necessary.   Regular physical exercise is one of the most important things you can do for your health. Most adults should get at least 150 minutes of moderate-intensity exercise (any activity that increases your heart rate and causes you to sweat) each week. In addition, most adults need muscle-strengthening exercises on 2 or more days a week.     Maintain a healthy weight. The body mass index (BMI) is a screening tool to identify possible weight problems. It provides an estimate of body fat based on height and weight. Your caregiver can help determine your BMI, and can help you achieve or maintain a healthy weight. For adults 20 years and older:   A BMI below 18.5 is considered underweight.   A BMI of 18.5 to 24.9 is normal.   A BMI of 25 to 29.9 is  considered overweight.   A BMI of 30 and above is considered obese.   Maintain normal blood lipids and cholesterol by exercising and minimizing your intake of saturated fat. Eat a balanced diet with plenty of fruits and vegetables. Blood tests for lipids and cholesterol should begin at age 45 and be repeated every 5 years. If your lipid or cholesterol levels are high, you are over 50, or you are a high risk for heart disease, you may need your cholesterol levels checked more frequently. Ongoing high lipid and cholesterol levels should be treated with medicines, if diet and exercise are not effective.   If you smoke, find out from your caregiver how to quit. If you do not use tobacco, do not start.   If you choose to drink alcohol, do not exceed 2 drinks per day. One drink is considered to be 12 ounces (355 mL) of beer, 5 ounces (148 mL) of wine, or 1.5 ounces (44 mL) of liquor.   Avoid use of street drugs. Do not share needles with anyone. Ask for help if you need support or instructions about stopping the use of drugs.   High blood pressure causes heart disease and increases the risk of stroke. Blood pressure should be checked at least every 1 to 2 years. Ongoing high blood pressure should be treated with medicines if weight loss and exercise are not effective.   If you are 80 to 59 years old, ask your caregiver if you should take aspirin to prevent heart disease.   Diabetes screening involves taking a blood sample to check  your fasting blood sugar level. This should be done once every 3 years, after age 29, if you are within normal weight and without risk factors for diabetes. Testing should be considered at a younger age or be carried out more frequently if you are overweight and have at least 1 risk factor for diabetes.   Colorectal cancer can be detected and often prevented. Most routine colorectal cancer screening begins at the age of 69 and continues through age 2. However, your caregiver  may recommend screening at an earlier age if you have risk factors for colon cancer. On a yearly basis, your caregiver may provide home test kits to check for hidden blood in the stool. Use of a small camera at the end of a tube, to directly examine the colon (sigmoidoscopy or colonoscopy), can detect the earliest forms of colorectal cancer. Talk to your caregiver about this at age 60, when routine screening begins. Direct examination of the colon should be repeated every 5 to 10 years through age 46, unless early forms of pre-cancerous polyps or small growths are found.   Hepatitis C blood testing is recommended for all people born from [birth year] through 1963 and any individual with known risks for hepatitis C.   Healthy men should no longer receive prostate-specific antigen (PSA) blood tests as part of routine cancer screening. Consult with your caregiver about prostate cancer screening.   Testicular cancer screening is not recommended for adolescents or adult males who have no symptoms. Screening includes self-exam, caregiver exam, and other screening tests. Consult with your caregiver about any symptoms you have or any concerns you have about testicular cancer.   Practice safe sex. Use condoms and avoid high-risk sexual practices to reduce the spread of sexually transmitted infections (STIs).   Use sunscreen with a sun protection factor (SPF) of 30 or greater. Apply sunscreen liberally and repeatedly throughout the day. You should seek shade when your shadow is shorter than you. Protect yourself by wearing long sleeves, pants, a wide-brimmed hat, and sunglasses year round, whenever you are outdoors.   Notify your caregiver of new moles or changes in moles, especially if there is a change in shape or color. Also notify your caregiver if a mole is larger than the size of a pencil eraser.   A one-time screening for abdominal aortic aneurysm (AAA) and surgical repair of large AAAs by sound wave imaging  (ultrasonography) is recommended for ages 9 to 36 years who are current or former smokers.   Stay current with your immunizations.  Document Released: 11/27/2007 Document Revised: 05/20/2011 Document Reviewed: 10/26/2010 [employer] Patient Information 2012 [place], [place].

## 2012-02-19 ENCOUNTER — Emergency Department (HOSPITAL_COMMUNITY): Payer: 59

## 2012-02-19 ENCOUNTER — Inpatient Hospital Stay (HOSPITAL_COMMUNITY)
Admission: EM | Admit: 2012-02-19 | Discharge: 2012-02-29 | DRG: 220 | Disposition: A | Discharge status: Rehabilitation Facility | Payer: 59 | Attending: Cardiothoracic Surgery | Admitting: Cardiothoracic Surgery

## 2012-02-19 ENCOUNTER — Encounter (HOSPITAL_COMMUNITY): Payer: Self-pay | Admitting: Adult Health

## 2012-02-19 DIAGNOSIS — G4733 Obstructive sleep apnea (adult) (pediatric): Secondary | ICD-10-CM | POA: Diagnosis present

## 2012-02-19 DIAGNOSIS — D696 Thrombocytopenia, unspecified: Secondary | ICD-10-CM | POA: Diagnosis present

## 2012-02-19 DIAGNOSIS — E785 Hyperlipidemia, unspecified: Secondary | ICD-10-CM | POA: Diagnosis present

## 2012-02-19 DIAGNOSIS — I7102 Dissection of abdominal aorta: Principal | ICD-10-CM | POA: Diagnosis present

## 2012-02-19 DIAGNOSIS — D62 Acute posthemorrhagic anemia: Secondary | ICD-10-CM | POA: Diagnosis present

## 2012-02-19 DIAGNOSIS — I7100 Dissection of unspecified site of aorta: Secondary | ICD-10-CM

## 2012-02-19 DIAGNOSIS — E8779 Other fluid overload: Secondary | ICD-10-CM | POA: Diagnosis present

## 2012-02-19 DIAGNOSIS — K5900 Constipation, unspecified: Secondary | ICD-10-CM | POA: Diagnosis present

## 2012-02-19 DIAGNOSIS — E559 Vitamin D deficiency, unspecified: Secondary | ICD-10-CM | POA: Diagnosis present

## 2012-02-19 DIAGNOSIS — J45909 Unspecified asthma, uncomplicated: Secondary | ICD-10-CM | POA: Diagnosis present

## 2012-02-19 DIAGNOSIS — E876 Hypokalemia: Secondary | ICD-10-CM | POA: Diagnosis not present

## 2012-02-19 DIAGNOSIS — F17200 Nicotine dependence, unspecified, uncomplicated: Secondary | ICD-10-CM | POA: Diagnosis present

## 2012-02-19 LAB — POCT I-STAT, CHEM 8
BUN: 16 mg/dL (ref 6–23)
Calcium, Ion: 1.11 mmol/L — ABNORMAL LOW (ref 1.12–1.23)
Chloride: 101 mEq/L (ref 96–112)
Creatinine, Ser: 1 mg/dL (ref 0.50–1.35)
Glucose, Bld: 116 mg/dL — ABNORMAL HIGH (ref 70–99)
HCT: 44 % (ref 39.0–52.0)
Hemoglobin: 15 g/dL (ref 13.0–17.0)
Potassium: 3.7 meq/L (ref 3.5–5.1)
Sodium: 140 meq/L (ref 135–145)
TCO2: 25 mmol/L (ref 0–100)

## 2012-02-19 LAB — BASIC METABOLIC PANEL WITH GFR
BUN: 17 mg/dL (ref 6–23)
Chloride: 98 meq/L (ref 96–112)
Creatinine, Ser: 0.95 mg/dL (ref 0.50–1.35)
GFR calc Af Amer: >90 mL/min (ref >90)
GFR calc non Af Amer: >90 mL/min (ref >90)
Potassium: 3.6 meq/L (ref 3.5–5.1)

## 2012-02-19 LAB — BASIC METABOLIC PANEL
CO2: 28 mEq/L (ref 19–32)
Calcium: 9.5 mg/dL (ref 8.4–10.5)
Glucose, Bld: 118 mg/dL — ABNORMAL HIGH (ref 70–99)
Sodium: 137 mEq/L (ref 135–145)

## 2012-02-19 LAB — CBC
HCT: 41.9 % (ref 39.0–52.0)
Hemoglobin: 14.7 g/dL (ref 13.0–17.0)
MCH: 32.4 pg (ref 26.0–34.0)
MCHC: 35.1 g/dL (ref 30.0–36.0)
MCV: 92.3 fL (ref 78.0–100.0)
Platelets: 151 K/uL (ref 150–400)
RBC: 4.54 MIL/uL (ref 4.22–5.81)
RDW: 13.3 % (ref 11.5–15.5)
WBC: 8.5 K/uL (ref 4.0–10.5)

## 2012-02-19 LAB — POCT I-STAT TROPONIN I: Troponin i, poc: 0 ng/mL (ref 0.00–0.08)

## 2012-02-19 MED ORDER — ONDANSETRON HCL 4 MG/2ML IJ SOLN
4.0000 mg | Freq: Once | INTRAMUSCULAR | Status: AC
  Administered 2012-02-19: 4 mg via INTRAVENOUS
  Filled 2012-02-19: qty 2

## 2012-02-19 MED ORDER — IOHEXOL 350 MG/ML SOLN
100.0000 mL | Freq: Once | INTRAVENOUS | Status: AC | PRN
  Administered 2012-02-19: 100 mL via INTRAVENOUS

## 2012-02-19 MED ORDER — NITROGLYCERIN 2 % TD OINT
1.0000 [in_us] | TOPICAL_OINTMENT | Freq: Once | TRANSDERMAL | Status: AC
  Administered 2012-02-19: 1 [in_us] via TOPICAL
  Filled 2012-02-19: qty 1

## 2012-02-19 MED ORDER — MORPHINE SULFATE 4 MG/ML IJ SOLN
4.0000 mg | Freq: Once | INTRAMUSCULAR | Status: AC
  Administered 2012-02-19: 4 mg via INTRAVENOUS
  Filled 2012-02-19: qty 1

## 2012-02-19 MED ORDER — SODIUM CHLORIDE 0.9 % IV SOLN
INTRAVENOUS | Status: DC
  Administered 2012-02-19: 21:00:00 via INTRAVENOUS

## 2012-02-19 MED ORDER — NITROGLYCERIN 0.4 MG SL SUBL
0.4000 mg | SUBLINGUAL_TABLET | SUBLINGUAL | Status: DC | PRN
  Administered 2012-02-19: 0.4 mg via SUBLINGUAL

## 2012-02-19 NOTE — Significant Event (Signed)
Rapid Response Event Note  Overview: Time Called: 1941 Arrival Time: 1943 Event Type: Neurologic  Initial Focused Assessment: Responded to Code Stroke in ER Trauma Room B. Upon arrival, EDP at bedside accompanied by nursing staff performing assessment of patient. Patient alert and oriented, responding appropriately to all commands, moving all extremities. Upon assessment interview, patient verbalized that he began experiencing mid sternal chest pain prior to arriving to emergency department and shortly after arrival, he began to experience migraine symptoms(seeing floaters and zig zag lines) associated with difficulty speaking and all over feeling of weakness without an associated  headache at this time. Patient reported that he has a history of migraines and that what he is describing is his typical migraine aura with the exception of the chest pain that he is experiencing which is new. Initial NIHHS 0; no neurological deficits noted at the time of assessment. Patient taken to CT scan accompanied by nursing staff and Dr Roseanne Reno, Neurologist.    Interventions: After obtaining CT scan, patient returned to ER room 6 Pod A. Dr Manus Gunning and Dr Roseanne Reno present at bedside performing further evaluation.    Event Summary:   at  2010 Code Stroke cancelled.     at          [employer], Anette Guarneri

## 2012-02-19 NOTE — ED Notes (Signed)
Patient arrived via front door with C/o chest pain and headache (like one of his typical migraine headaches)  Stated his speech was some what slurred but the chest pain continued. No deficits noted.  Head CT done and then to room 6

## 2012-02-19 NOTE — ED Provider Notes (Signed)
History     CSN: [id number]  Arrival date & time 02/19/12  4696   First MD Initiated Contact with Patient 02/19/12 1934      Chief Complaint  Patient presents with  . Code Stroke    (Consider location/radiation/quality/duration/timing/severity/associated sxs/prior treatment) HPI Comments: Patient presents to the hospital with acute onset of substernal chest pain it radiates to his neck and his jaw onset for the past hour period course history as she developed difficulty speaking and slurred his words and a code stroke was activated. CT head is negative. Slurred speech has resolved. Patient has no neurological deficits are present. He denies any cardiac history. He describes chest pain difficulty breathing denies any cough or fever. Has a history of migraines with previous or type symptoms and neurology believes that is what occurred. He denies any headache now.  The history is provided by the patient and a relative.    Past Medical History  Diagnosis Date  . OBSTRUCTIVE SLEEP APNEA 02/2008 sleep study  . MIGRAINE HEADACHE   . ERECTILE DYSFUNCTION, ORGANIC   . Unspecified vitamin D deficiency   . HYPERLIPIDEMIA   . ASTHMA   . ALLERGIC RHINITIS     Past Surgical History  Procedure Date  . Nasal fx without repair     Family History  Problem Relation Age of Onset  . Diabetes Mother   . Heart disease Maternal Uncle   . Heart disease Paternal Uncle   . Heart disease Cousin   . Lung cancer Cousin     2 cousins    History  Substance Use Topics  . Smoking status: Current Everyday Smoker -- 0.1 packs/day  . Smokeless tobacco: Not on file   Comment: 10 cigs/day  . Alcohol Use: Yes     Rarely      Review of Systems  Constitutional: Negative for fever, activity change and appetite change.  HENT: Negative for congestion and rhinorrhea.   Eyes: Negative for visual disturbance.  Respiratory: Positive for chest tightness and shortness of breath. Negative for cough.     Cardiovascular: Positive for chest pain.  Gastrointestinal: Negative for nausea, vomiting and abdominal pain.  Genitourinary: Negative for dysuria and hematuria.  Musculoskeletal: Negative for back pain.  Skin: Negative for rash.  Neurological: Positive for speech difficulty. Negative for dizziness, weakness and headaches.    Allergies  Testosterone  Home Medications   Current Outpatient Rx  Name Route Sig Dispense Refill  . ALPRAZOLAM 1 MG PO TABS Oral Take 1 mg by mouth at bedtime as needed. For insomnia    . VITAMIN D 1000 UNITS PO TABS Oral Take 2,000 Units by mouth daily.    [name] VITAMIN B-12 PO Oral Take 1 tablet by mouth daily.    [name] EPINEPHRINE 0.3 MG/0.3ML IJ DEVI Intramuscular Inject 0.3 mg into the muscle as needed. For anaphylaxis     . EZETIMIBE-SIMVASTATIN 10-40 MG PO TABS Oral Take 0.5 tablets by mouth daily.    [name] FEXOFENADINE HCL 180 MG PO TABS Oral Take 180 mg by mouth daily.      [name] FLUTICASONE PROPIONATE 50 MCG/ACT NA SUSP Nasal Place 2 sprays into the nose daily.    [name] MONTELUKAST SODIUM 10 MG PO TABS Oral Take 10 mg by mouth daily.    . OMEGA-3-ACID ETHYL ESTERS 1 G PO CAPS Oral Take 1 g by mouth 2 (two) times daily.    [name] PREDNISONE 5 MG PO TABS Oral Take 5 mg by mouth daily as needed.  For asthma flare up    . ROSUVASTATIN CALCIUM 10 MG PO TABS Oral Take 10 mg by mouth daily.    . TRETINOIN 0.1 % EX CREA Topical Apply 1 application topically daily as needed. For acne or rash      BP 157/76  Pulse 64  Temp 97.6 F (36.4 C) (Oral)  Resp 16  SpO2 97%  Physical Exam  Constitutional: He is oriented to person, place, and time. He appears well-developed and well-nourished. No distress.  HENT:  Head: Normocephalic and atraumatic.  Mouth/Throat: Oropharynx is clear and moist. No oropharyngeal exudate.  Eyes: Conjunctivae and EOM are normal. Pupils are equal, round, and reactive to light.  Neck: Normal range of motion. Neck supple.  Cardiovascular: Normal rate and  regular rhythm.   Murmur heard. Pulmonary/Chest: Effort normal and breath sounds normal. No respiratory distress.  Abdominal: Soft. There is no tenderness. There is no rebound and no guarding.  Musculoskeletal: Normal range of motion. He exhibits no edema and no tenderness.  Neurological: He is alert and oriented to person, place, and time. No cranial nerve deficit.       Cranial nerves 2-12 intact, 5 out of 5 strength throughout, speech fluent. No ataxia finger to nose, no nystagmus  Skin: Skin is warm.    ED Course  Procedures (including critical care time)  Labs Reviewed  BASIC METABOLIC PANEL - Abnormal; Notable for the following:    Glucose, Bld 118 (*)     All other components within normal limits  GLUCOSE, CAPILLARY - Abnormal; Notable for the following:    Glucose-Capillary 113 (*)     All other components within normal limits  POCT I-STAT, CHEM 8 - Abnormal; Notable for the following:    Glucose, Bld 116 (*)     Calcium, Ion 1.11 (*)     All other components within normal limits  CBC  POCT I-STAT TROPONIN I  TROPONIN I   Ct Head Wo Contrast  02/19/2012  *RADIOLOGY REPORT*  Clinical Data: 61 year old male with headache, visual changes. Altered mental status.  CT HEAD WITHOUT CONTRAST  Technique:  Contiguous axial images were obtained from the base of the skull through the vertex without contrast.  Comparison: None.  Findings: Visualized orbit soft tissues are within normal limits. Mildly asymmetric temporalis muscles, but no definite scalp hematoma or scalp soft tissue abnormality.  Visualized paranasal sinuses and mastoids are clear.  No acute osseous abnormality identified.  Cerebral volume is within normal limits for age.  No midline shift, ventriculomegaly, mass effect, evidence of mass lesion, intracranial hemorrhage or evidence of cortically based acute infarction.  Gray-white matter differentiation is within normal limits throughout the brain.  No suspicious intracranial  vascular hyperdensity.  IMPRESSION: Normal noncontrast CT appearance of the brain.  Critical Value/emergent results were called by telephone at the time of interpretation on 02/19/2012 at 2009 hours to Dr. Manus Gunning, who verbally acknowledged these results.   Original Report Authenticated By: Harley Hallmark, M.D.    Dg Chest Portable 1 View  02/19/2012  *RADIOLOGY REPORT*  Clinical Data: 61 year old male Code stroke.  Chest pain.  PORTABLE CHEST - 1 VIEW  Comparison: None.  Findings: Portable upright AP view at 2034 hours.  Lung volumes within normal limits.  Cardiac size and mediastinal contours are within normal limits.  Visualized tracheal air column is within normal limits.  Allowing for portable technique, the lungs are clear.  No pneumothorax or effusion.  IMPRESSION: No acute cardiopulmonary abnormality.  Original Report Authenticated By: Harley Hallmark, M.D.      No diagnosis found.    MDM  Patient arrived as a code stroke with difficulty with speech, "not thinking right is slow to respond. He chest pressure that started about 6 PM in the center of his chest it radiates to his neck associated with shortness of breath and nausea. He denies any cardiac history.  Code stroke was called prior to arrival Dr. Roseanne Reno evaluated patient. Patient's speech resolved, he had no motor or sensory deficits and code stroke was canceled. Suspect complicated migraine.  No ST changes an EKG. Chest x-ray negative.  CTA positive for type A dissection.  Also AAA and thoracic aneurysm without evidence of leakage.  +2 femoral, DP, PT pulses. Difficulty contacting Dr. Morton Peters.  D/w Dr Hart Rochester who reviewed images and states patient is not candidate for endovascular graft. BP control with labetalol and esmolol.  D/w Dr. Morton Peters who is coming to evaluate.       Date: 02/19/2012  Rate: 69  Rhythm: normal sinus rhythm  QRS Axis: left  Intervals: normal  ST/T Wave abnormalities: nonspecific ST/T changes   Conduction Disutrbances:none  Narrative Interpretation:   Old EKG Reviewed: none available   Date: 02/20/2012  Rate: 61  Rhythm: normal sinus rhythm  QRS Axis: left  Intervals: normal  ST/T Wave abnormalities: normal  Conduction Disutrbances:none  Narrative Interpretation:   Old EKG Reviewed: unchanged  CRITICAL CARE Performed by: Glynn Octave   Total critical care time: 40  Critical care time was exclusive of separately billable procedures and treating other patients.  Critical care was necessary to treat or prevent imminent or life-threatening deterioration.  Critical care was time spent personally by me on the following activities: development of treatment plan with patient and/or surrogate as well as nursing, discussions with consultants, evaluation of patient's response to treatment, examination of patient, obtaining history from patient or surrogate, ordering and performing treatments and interventions, ordering and review of laboratory studies, ordering and review of radiographic studies, pulse oximetry and re-evaluation of patient's condition.   Glynn Octave, MD 02/20/12 [phone]

## 2012-02-19 NOTE — ED Notes (Signed)
Patient transported to CT 

## 2012-02-19 NOTE — ED Notes (Addendum)
C/o one hour of pressure in center of chest that radiates to neck associated with headache and SOB and weakness. Pt states, "I can't think right"  And is c/o blurred vision. No drift, no droop and no slurred speech. Answers questions appropriately.  Took 2 ASA at home. Having difficulty getting words out.  Last normal at 18:30. Words continue to become more difficult to get out. Dr. Judd Lien called to assess pt and called Code Stroke.

## 2012-02-20 ENCOUNTER — Encounter (HOSPITAL_COMMUNITY): Payer: Self-pay | Admitting: Anesthesiology

## 2012-02-20 ENCOUNTER — Emergency Department (HOSPITAL_COMMUNITY): Payer: 59 | Admitting: Anesthesiology

## 2012-02-20 ENCOUNTER — Encounter (HOSPITAL_COMMUNITY)
Admission: EM | Disposition: A | Discharge status: Rehabilitation Facility | Payer: Self-pay | Source: Home / Self Care | Attending: Cardiothoracic Surgery

## 2012-02-20 ENCOUNTER — Inpatient Hospital Stay (HOSPITAL_COMMUNITY): Payer: 59

## 2012-02-20 ENCOUNTER — Emergency Department (HOSPITAL_COMMUNITY): Payer: 59

## 2012-02-20 DIAGNOSIS — I7101 Dissection of thoracic aorta: Secondary | ICD-10-CM

## 2012-02-20 DIAGNOSIS — I71019 Dissection of thoracic aorta, unspecified: Secondary | ICD-10-CM

## 2012-02-20 HISTORY — PX: THORACIC AORTIC ANEURYSM REPAIR: SHX799

## 2012-02-20 HISTORY — DX: Dissection of thoracic aorta, unspecified: I71.019

## 2012-02-20 HISTORY — DX: Dissection of thoracic aorta: I71.01

## 2012-02-20 LAB — POCT I-STAT 4, (NA,K, GLUC, HGB,HCT)
Glucose, Bld: 174 mg/dL — ABNORMAL HIGH (ref 70–99)
Glucose, Bld: 150 mg/dL — ABNORMAL HIGH (ref 70–99)
Glucose, Bld: 137 mg/dL — ABNORMAL HIGH (ref 70–99)
Glucose, Bld: 137 mg/dL — ABNORMAL HIGH (ref 70–99)
Glucose, Bld: 162 mg/dL — ABNORMAL HIGH (ref 70–99)
Glucose, Bld: 184 mg/dL — ABNORMAL HIGH (ref 70–99)
Glucose, Bld: 219 mg/dL — ABNORMAL HIGH (ref 70–99)
Glucose, Bld: 215 mg/dL — ABNORMAL HIGH (ref 70–99)
Glucose, Bld: 188 mg/dL — ABNORMAL HIGH (ref 70–99)
Glucose, Bld: 212 mg/dL — ABNORMAL HIGH (ref 70–99)
Glucose, Bld: 131 mg/dL — ABNORMAL HIGH (ref 70–99)
HCT: 38 % — ABNORMAL LOW (ref 39.0–52.0)
HCT: 34 % — ABNORMAL LOW (ref 39.0–52.0)
HCT: 26 % — ABNORMAL LOW (ref 39.0–52.0)
HCT: 29 % — ABNORMAL LOW (ref 39.0–52.0)
HCT: 30 % — ABNORMAL LOW (ref 39.0–52.0)
HCT: 27 % — ABNORMAL LOW (ref 39.0–52.0)
HCT: 31 % — ABNORMAL LOW (ref 39.0–52.0)
HCT: 32 % — ABNORMAL LOW (ref 39.0–52.0)
HCT: 27 % — ABNORMAL LOW (ref 39.0–52.0)
HCT: 31 % — ABNORMAL LOW (ref 39.0–52.0)
HCT: 21 % — ABNORMAL LOW (ref 39.0–52.0)
Hemoglobin: 12.9 g/dL — ABNORMAL LOW (ref 13.0–17.0)
Hemoglobin: 11.6 g/dL — ABNORMAL LOW (ref 13.0–17.0)
Hemoglobin: 8.8 g/dL — ABNORMAL LOW (ref 13.0–17.0)
Hemoglobin: 9.9 g/dL — ABNORMAL LOW (ref 13.0–17.0)
Hemoglobin: 10.2 g/dL — ABNORMAL LOW (ref 13.0–17.0)
Hemoglobin: 9.2 g/dL — ABNORMAL LOW (ref 13.0–17.0)
Hemoglobin: 10.5 g/dL — ABNORMAL LOW (ref 13.0–17.0)
Hemoglobin: 10.9 g/dL — ABNORMAL LOW (ref 13.0–17.0)
Hemoglobin: 10.5 g/dL — ABNORMAL LOW (ref 13.0–17.0)
Hemoglobin: 9.2 g/dL — ABNORMAL LOW (ref 13.0–17.0)
Hemoglobin: 7.1 g/dL — ABNORMAL LOW (ref 13.0–17.0)
Potassium: 3.9 mEq/L (ref 3.5–5.1)
Potassium: 4.4 mEq/L (ref 3.5–5.1)
Potassium: 4 mEq/L (ref 3.5–5.1)
Potassium: 4.7 mEq/L (ref 3.5–5.1)
Potassium: 5.4 mEq/L — ABNORMAL HIGH (ref 3.5–5.1)
Potassium: 5.1 mEq/L (ref 3.5–5.1)
Potassium: 4.6 mEq/L (ref 3.5–5.1)
Potassium: 4.2 mEq/L (ref 3.5–5.1)
Potassium: 4 mEq/L (ref 3.5–5.1)
Potassium: 4.3 mEq/L (ref 3.5–5.1)
Potassium: 3.6 mEq/L (ref 3.5–5.1)
Sodium: 138 mEq/L (ref 135–145)
Sodium: 137 mEq/L (ref 135–145)
Sodium: 134 mEq/L — ABNORMAL LOW (ref 135–145)
Sodium: 135 mEq/L (ref 135–145)
Sodium: 135 mEq/L (ref 135–145)
Sodium: 135 mEq/L (ref 135–145)
Sodium: 136 mEq/L (ref 135–145)
Sodium: 138 mEq/L (ref 135–145)
Sodium: 139 mEq/L (ref 135–145)
Sodium: 141 mEq/L (ref 135–145)
Sodium: 143 mEq/L (ref 135–145)

## 2012-02-20 LAB — PLATELET COUNT: Platelets: 52 10*3/uL — ABNORMAL LOW (ref 150–400)

## 2012-02-20 LAB — POCT I-STAT 3, ART BLOOD GAS (G3+)
Acid-base deficit: 10 mmol/L — ABNORMAL HIGH (ref 0.0–2.0)
Acid-base deficit: 4 mmol/L — ABNORMAL HIGH (ref 0.0–2.0)
Acid-base deficit: 6 mmol/L — ABNORMAL HIGH (ref 0.0–2.0)
Acid-base deficit: 3 mmol/L — ABNORMAL HIGH (ref 0.0–2.0)
Acid-base deficit: 4 mmol/L — ABNORMAL HIGH (ref 0.0–2.0)
Acid-base deficit: 1 mmol/L (ref 0.0–2.0)
Bicarbonate: 27 mEq/L — ABNORMAL HIGH (ref 20.0–24.0)
Bicarbonate: 20.9 mEq/L (ref 20.0–24.0)
Bicarbonate: 21.4 mEq/L (ref 20.0–24.0)
Bicarbonate: 21.1 mEq/L (ref 20.0–24.0)
Bicarbonate: 23.6 mEq/L (ref 20.0–24.0)
Bicarbonate: 23.8 mEq/L (ref 20.0–24.0)
Bicarbonate: 25.1 mEq/L — ABNORMAL HIGH (ref 20.0–24.0)
O2 Saturation: 100 %
O2 Saturation: 100 %
O2 Saturation: 100 %
O2 Saturation: 99 %
O2 Saturation: 86 %
O2 Saturation: 96 %
O2 Saturation: 100 %
Patient temperature: 36.5
TCO2: 29 mmol/L (ref 0–100)
TCO2: 23 mmol/L (ref 0–100)
TCO2: 23 mmol/L (ref 0–100)
TCO2: 23 mmol/L (ref 0–100)
TCO2: 25 mmol/L (ref 0–100)
TCO2: 25 mmol/L (ref 0–100)
TCO2: 26 mmol/L (ref 0–100)
pCO2 arterial: 56 mmHg — ABNORMAL HIGH (ref 35.0–45.0)
pCO2 arterial: 77.1 mmHg (ref 35.0–45.0)
pCO2 arterial: 37.3 mmHg (ref 35.0–45.0)
pCO2 arterial: 47.7 mmHg — ABNORMAL HIGH (ref 35.0–45.0)
pCO2 arterial: 48.3 mmHg — ABNORMAL HIGH (ref 35.0–45.0)
pCO2 arterial: 53.2 mmHg — ABNORMAL HIGH (ref 35.0–45.0)
pCO2 arterial: 45.3 mmHg — ABNORMAL HIGH (ref 35.0–45.0)
pH, Arterial: 7.291 — ABNORMAL LOW (ref 7.350–7.450)
pH, Arterial: 7.042 — CL (ref 7.350–7.450)
pH, Arterial: 7.367 (ref 7.350–7.450)
pH, Arterial: 7.254 — ABNORMAL LOW (ref 7.350–7.450)
pH, Arterial: 7.259 — ABNORMAL LOW (ref 7.350–7.450)
pH, Arterial: 7.298 — ABNORMAL LOW (ref 7.350–7.450)
pH, Arterial: 7.349 — ABNORMAL LOW (ref 7.350–7.450)
pO2, Arterial: 396 mmHg — ABNORMAL HIGH (ref 80.0–100.0)
pO2, Arterial: 254 mmHg — ABNORMAL HIGH (ref 80.0–100.0)
pO2, Arterial: 308 mmHg — ABNORMAL HIGH (ref 80.0–100.0)
pO2, Arterial: 139 mmHg — ABNORMAL HIGH (ref 80.0–100.0)
pO2, Arterial: 59 mmHg — ABNORMAL LOW (ref 80.0–100.0)
pO2, Arterial: 90 mmHg (ref 80.0–100.0)
pO2, Arterial: 219 mmHg — ABNORMAL HIGH (ref 80.0–100.0)

## 2012-02-20 LAB — D-DIMER, QUANTITATIVE: D-Dimer, Quant: >20 ug/mL-FEU — ABNORMAL HIGH (ref 0.00–0.48)

## 2012-02-20 LAB — POCT I-STAT, CHEM 8
BUN: 16 mg/dL (ref 6–23)
Calcium, Ion: 1.07 mmol/L — ABNORMAL LOW (ref 1.12–1.23)
Chloride: 104 mEq/L (ref 96–112)
Creatinine, Ser: 0.9 mg/dL (ref 0.50–1.35)
Glucose, Bld: 147 mg/dL — ABNORMAL HIGH (ref 70–99)
HCT: 22 % — ABNORMAL LOW (ref 39.0–52.0)
Hemoglobin: 7.5 g/dL — ABNORMAL LOW (ref 13.0–17.0)
Potassium: 3.7 mEq/L (ref 3.5–5.1)
Sodium: 143 mEq/L (ref 135–145)
TCO2: 22 mmol/L (ref 0–100)

## 2012-02-20 LAB — ABO/RH: ABO/RH(D): A POS

## 2012-02-20 LAB — CBC WITH DIFFERENTIAL/PLATELET
Basophils Relative: 0 % (ref 0–1)
Eosinophils Absolute: 0.2 10*3/uL (ref 0.0–0.7)
Eosinophils Relative: 1 % (ref 0–5)
HCT: 40.3 % (ref 39.0–52.0)
Hemoglobin: 14.2 g/dL (ref 13.0–17.0)
MCH: 32.6 pg (ref 26.0–34.0)
MCHC: 35.2 g/dL (ref 30.0–36.0)
MCV: 92.6 fL (ref 78.0–100.0)
Monocytes Absolute: 1.1 10*3/uL — ABNORMAL HIGH (ref 0.1–1.0)
Monocytes Relative: 9 % (ref 3–12)

## 2012-02-20 LAB — CBC
HCT: 24.3 % — ABNORMAL LOW (ref 39.0–52.0)
Hemoglobin: 8.5 g/dL — ABNORMAL LOW (ref 13.0–17.0)
MCH: 32.1 pg (ref 26.0–34.0)
MCHC: 35 g/dL (ref 30.0–36.0)
MCV: 91.7 fL (ref 78.0–100.0)
Platelets: 54 10*3/uL — ABNORMAL LOW (ref 150–400)
Platelets: 46 10*3/uL — ABNORMAL LOW (ref 150–400)
RBC: 2.65 MIL/uL — ABNORMAL LOW (ref 4.22–5.81)
RDW: 13.7 % (ref 11.5–15.5)
RDW: 13.6 % (ref 11.5–15.5)
WBC: 5.7 10*3/uL (ref 4.0–10.5)
WBC: 6.5 10*3/uL (ref 4.0–10.5)

## 2012-02-20 LAB — APTT: aPTT: 51 seconds — ABNORMAL HIGH (ref 24–37)

## 2012-02-20 LAB — HEMOGLOBIN AND HEMATOCRIT, BLOOD
HCT: 27.7 % — ABNORMAL LOW (ref 39.0–52.0)
Hemoglobin: 9.8 g/dL — ABNORMAL LOW (ref 13.0–17.0)

## 2012-02-20 LAB — CREATININE, SERUM
Creatinine, Ser: 0.9 mg/dL (ref 0.50–1.35)
GFR calc Af Amer: >90 mL/min (ref >90)
GFR calc non Af Amer: >90 mL/min (ref >90)

## 2012-02-20 LAB — POCT I-STAT TROPONIN I: Troponin i, poc: 0.22 ng/mL (ref 0.00–0.08)

## 2012-02-20 LAB — MAGNESIUM: Magnesium: 2.4 mg/dL (ref 1.5–2.5)

## 2012-02-20 LAB — TROPONIN I: Troponin I: 0.84 ng/mL (ref <0.30)

## 2012-02-20 LAB — PROTIME-INR: INR: 1.5 — ABNORMAL HIGH (ref 0.00–1.49)

## 2012-02-20 SURGERY — REPAIR, ANEURYSM, AORTA, THORACIC, ASCENDING
Anesthesia: General | Site: Chest | Wound class: Clean

## 2012-02-20 MED ORDER — VANCOMYCIN HCL 1000 MG IV SOLR
1500.0000 mg | INTRAVENOUS | Status: AC
  Administered 2012-02-20: 500 mg via INTRAVENOUS
  Administered 2012-02-20: 1500 mg via INTRAVENOUS
  Filled 2012-02-20: qty 1500

## 2012-02-20 MED ORDER — SODIUM CHLORIDE 0.45 % IV SOLN
INTRAVENOUS | Status: DC
  Administered 2012-02-20: 17:00:00 via INTRAVENOUS

## 2012-02-20 MED ORDER — ACETAMINOPHEN 650 MG RE SUPP
650.0000 mg | RECTAL | Status: AC
  Administered 2012-02-20: 650 mg via RECTAL

## 2012-02-20 MED ORDER — SODIUM CHLORIDE 0.9 % IJ SOLN
3.0000 mL | Freq: Two times a day (BID) | INTRAMUSCULAR | Status: DC
  Administered 2012-02-21 – 2012-02-27 (×7): 3 mL via INTRAVENOUS

## 2012-02-20 MED ORDER — ATORVASTATIN CALCIUM 20 MG PO TABS
20.0000 mg | ORAL_TABLET | Freq: Every day | ORAL | Status: DC
  Administered 2012-02-22 – 2012-02-28 (×6): 20 mg via ORAL
  Filled 2012-02-20 (×10): qty 1

## 2012-02-20 MED ORDER — METOPROLOL TARTRATE 12.5 MG HALF TABLET
12.5000 mg | ORAL_TABLET | Freq: Once | ORAL | Status: DC
  Filled 2012-02-20: qty 1

## 2012-02-20 MED ORDER — INSULIN REGULAR BOLUS VIA INFUSION
0.0000 [IU] | Freq: Three times a day (TID) | INTRAVENOUS | Status: DC
  Filled 2012-02-20: qty 10

## 2012-02-20 MED ORDER — DIPHENHYDRAMINE HCL 50 MG/ML IJ SOLN
INTRAMUSCULAR | Status: DC | PRN
  Administered 2012-02-20 (×2): 12.5 mg via INTRAVENOUS

## 2012-02-20 MED ORDER — ALBUMIN HUMAN 5 % IV SOLN
INTRAVENOUS | Status: AC
  Filled 2012-02-20: qty 250

## 2012-02-20 MED ORDER — PHENYLEPHRINE HCL 10 MG/ML IJ SOLN
0.0000 ug/min | INTRAVENOUS | Status: DC
  Filled 2012-02-20: qty 2

## 2012-02-20 MED ORDER — METOPROLOL TARTRATE 25 MG/10 ML ORAL SUSPENSION
12.5000 mg | Freq: Two times a day (BID) | ORAL | Status: DC
  Filled 2012-02-20 (×5): qty 5

## 2012-02-20 MED ORDER — CHLORHEXIDINE GLUCONATE 4 % EX LIQD
60.0000 mL | Freq: Once | CUTANEOUS | Status: DC
  Filled 2012-02-20: qty 60

## 2012-02-20 MED ORDER — DEXTROSE 5 % IV SOLN
INTRAVENOUS | Status: DC | PRN
  Administered 2012-02-20: 05:00:00 via INTRAVENOUS

## 2012-02-20 MED ORDER — IOHEXOL 350 MG/ML SOLN
75.0000 mL | Freq: Once | INTRAVENOUS | Status: AC | PRN
  Administered 2012-02-20: 75 mL via INTRAVENOUS

## 2012-02-20 MED ORDER — ASPIRIN EC 325 MG PO TBEC
325.0000 mg | DELAYED_RELEASE_TABLET | Freq: Every day | ORAL | Status: DC
  Filled 2012-02-20: qty 1

## 2012-02-20 MED ORDER — MAGNESIUM SULFATE 50 % IJ SOLN
40.0000 meq | INTRAMUSCULAR | Status: DC
  Filled 2012-02-20: qty 10

## 2012-02-20 MED ORDER — TRANEXAMIC ACID (OHS) PUMP PRIME SOLUTION
2.0000 mg/kg | INTRAVENOUS | Status: DC
  Filled 2012-02-20: qty 2.2

## 2012-02-20 MED ORDER — FLUTICASONE PROPIONATE 50 MCG/ACT NA SUSP
2.0000 | Freq: Every day | NASAL | Status: DC
  Administered 2012-02-21 – 2012-02-29 (×9): 2 via NASAL
  Filled 2012-02-20: qty 16

## 2012-02-20 MED ORDER — DOPAMINE-DEXTROSE 3.2-5 MG/ML-% IV SOLN
2.0000 ug/kg/min | INTRAVENOUS | Status: AC
  Administered 2012-02-20: 4 ug/kg/min via INTRAVENOUS
  Filled 2012-02-20: qty 250

## 2012-02-20 MED ORDER — VANCOMYCIN HCL IN DEXTROSE 1-5 GM/200ML-% IV SOLN: 1000.0000 mg | Freq: Once | INTRAVENOUS | Status: DC

## 2012-02-20 MED ORDER — MORPHINE SULFATE 2 MG/ML IJ SOLN
1.0000 mg | INTRAMUSCULAR | Status: AC | PRN
  Administered 2012-02-20 – 2012-02-21 (×2): 2 mg via INTRAVENOUS

## 2012-02-20 MED ORDER — SODIUM CHLORIDE 0.9 % IV SOLN
INTRAVENOUS | Status: DC
  Administered 2012-02-20: 14.1 [IU]/h via INTRAVENOUS
  Administered 2012-02-20: 17:00:00 via INTRAVENOUS
  Filled 2012-02-20 (×3): qty 1

## 2012-02-20 MED ORDER — FUROSEMIDE 10 MG/ML IJ SOLN
INTRAMUSCULAR | Status: DC | PRN
  Administered 2012-02-20: 10 mg via INTRAMUSCULAR

## 2012-02-20 MED ORDER — ACETAMINOPHEN 160 MG/5ML PO SOLN: 650.0000 mg | ORAL | Status: AC

## 2012-02-20 MED ORDER — DEXTROSE 5 % IV SOLN
750.0000 mg | INTRAVENOUS | Status: DC
  Filled 2012-02-20: qty 750

## 2012-02-20 MED ORDER — DOPAMINE-DEXTROSE 3.2-5 MG/ML-% IV SOLN
2.0000 ug/kg/min | INTRAVENOUS | Status: DC
  Filled 2012-02-20: qty 250

## 2012-02-20 MED ORDER — NITROGLYCERIN IN D5W 200-5 MCG/ML-% IV SOLN: 0.0000 ug/min | INTRAVENOUS | Status: DC

## 2012-02-20 MED ORDER — ALBUMIN HUMAN 5 % IV SOLN
250.0000 mL | INTRAVENOUS | Status: AC | PRN
  Administered 2012-02-20 (×4): 250 mL via INTRAVENOUS
  Filled 2012-02-20: qty 500

## 2012-02-20 MED ORDER — ACETAMINOPHEN 500 MG PO TABS
1000.0000 mg | ORAL_TABLET | Freq: Four times a day (QID) | ORAL | Status: DC
  Administered 2012-02-21 – 2012-02-23 (×7): 1000 mg via ORAL
  Filled 2012-02-20 (×12): qty 2

## 2012-02-20 MED ORDER — SODIUM BICARBONATE 8.4 % IV SOLN
INTRAVENOUS | Status: AC
  Administered 2012-02-20: 06:00:00
  Filled 2012-02-20: qty 2.5

## 2012-02-20 MED ORDER — DEXMEDETOMIDINE HCL IN NACL 400 MCG/100ML IV SOLN
0.1000 ug/kg/h | INTRAVENOUS | Status: AC
  Administered 2012-02-20: 0.3 ug/kg/h via INTRAVENOUS
  Filled 2012-02-20: qty 100

## 2012-02-20 MED ORDER — POTASSIUM CHLORIDE 2 MEQ/ML IV SOLN
80.0000 meq | INTRAVENOUS | Status: DC
  Filled 2012-02-20: qty 40

## 2012-02-20 MED ORDER — HEMOSTATIC AGENTS (NO CHARGE) OPTIME
TOPICAL | Status: DC | PRN
  Administered 2012-02-20: 1 via TOPICAL

## 2012-02-20 MED ORDER — BUDESONIDE 0.5 MG/2ML IN SUSP
0.5000 mg | Freq: Two times a day (BID) | RESPIRATORY_TRACT | Status: DC
  Administered 2012-02-20 – 2012-02-28 (×16): 0.5 mg via RESPIRATORY_TRACT
  Filled 2012-02-20 (×20): qty 2

## 2012-02-20 MED ORDER — BISACODYL 5 MG PO TBEC
5.0000 mg | DELAYED_RELEASE_TABLET | Freq: Once | ORAL | Status: DC
  Filled 2012-02-20: qty 1
  Filled 2012-02-20 (×2): qty 2

## 2012-02-20 MED ORDER — DEXMEDETOMIDINE HCL IN NACL 200 MCG/50ML IV SOLN
0.1000 ug/kg/h | INTRAVENOUS | Status: DC
  Administered 2012-02-20 (×3): 0.7 ug/kg/h via INTRAVENOUS
  Administered 2012-02-21: 0.6 ug/kg/h via INTRAVENOUS
  Administered 2012-02-21 (×2): 0.7 ug/kg/h via INTRAVENOUS
  Filled 2012-02-20 (×5): qty 50

## 2012-02-20 MED ORDER — SODIUM CHLORIDE 0.9 % IV SOLN
INTRAVENOUS | Status: AC
  Administered 2012-02-20: 3.4 [IU]/h via INTRAVENOUS
  Filled 2012-02-20: qty 1

## 2012-02-20 MED ORDER — TRANEXAMIC ACID (OHS) BOLUS VIA INFUSION
15.0000 mg/kg | INTRAVENOUS | Status: AC
  Administered 2012-02-20: 1647 mg via INTRAVENOUS
  Filled 2012-02-20: qty 1647

## 2012-02-20 MED ORDER — DOCUSATE SODIUM 100 MG PO CAPS
200.0000 mg | ORAL_CAPSULE | Freq: Every day | ORAL | Status: DC
  Administered 2012-02-21 – 2012-02-29 (×8): 200 mg via ORAL
  Filled 2012-02-20 (×8): qty 2

## 2012-02-20 MED ORDER — METOPROLOL TARTRATE 1 MG/ML IV SOLN: 2.5000 mg | INTRAVENOUS | Status: DC | PRN

## 2012-02-20 MED ORDER — FENTANYL CITRATE 0.05 MG/ML IJ SOLN
INTRAMUSCULAR | Status: DC | PRN
  Administered 2012-02-20: 250 ug via INTRAVENOUS
  Administered 2012-02-20: 100 ug via INTRAVENOUS
  Administered 2012-02-20: 250 ug via INTRAVENOUS
  Administered 2012-02-20: 400 ug via INTRAVENOUS
  Administered 2012-02-20: 250 ug via INTRAVENOUS
  Administered 2012-02-20: 150 ug via INTRAVENOUS
  Administered 2012-02-20: 100 ug via INTRAVENOUS
  Administered 2012-02-20 (×2): 250 ug via INTRAVENOUS

## 2012-02-20 MED ORDER — PHENYLEPHRINE HCL 10 MG/ML IJ SOLN
30.0000 ug/min | INTRAVENOUS | Status: DC
  Filled 2012-02-20: qty 2

## 2012-02-20 MED ORDER — TRANEXAMIC ACID 100 MG/ML IV SOLN
1.5000 mg/kg/h | INTRAVENOUS | Status: AC
  Administered 2012-02-20: 1.5 mg/kg/h via INTRAVENOUS
  Filled 2012-02-20: qty 25

## 2012-02-20 MED ORDER — PROPOFOL 10 MG/ML IV EMUL
INTRAVENOUS | Status: DC | PRN
  Administered 2012-02-20: 80 mg via INTRAVENOUS
  Administered 2012-02-20: 70 mg via INTRAVENOUS

## 2012-02-20 MED ORDER — CALCIUM CHLORIDE 10 % IV SOLN
INTRAVENOUS | Status: DC | PRN
  Administered 2012-02-20 (×2): .3 g via INTRAVENOUS
  Administered 2012-02-20: .4 g via INTRAVENOUS

## 2012-02-20 MED ORDER — DEXTROSE 5 % IV SOLN
1.5000 g | INTRAVENOUS | Status: AC
  Administered 2012-02-20: 1.5 g via INTRAVENOUS
  Administered 2012-02-20: .75 g via INTRAVENOUS
  Filled 2012-02-20: qty 1.5

## 2012-02-20 MED ORDER — NITROGLYCERIN IN D5W 200-5 MCG/ML-% IV SOLN
2.0000 ug/min | INTRAVENOUS | Status: DC
  Filled 2012-02-20: qty 250

## 2012-02-20 MED ORDER — SODIUM CHLORIDE 0.9 % IV SOLN
INTRAVENOUS | Status: DC | PRN
  Administered 2012-02-20: 05:00:00 via INTRAVENOUS

## 2012-02-20 MED ORDER — SODIUM CHLORIDE 0.9 % IV SOLN
INTRAVENOUS | Status: DC
  Administered 2012-02-23 (×2): via INTRAVENOUS

## 2012-02-20 MED ORDER — HEMOSTATIC AGENTS (NO CHARGE) OPTIME
TOPICAL | Status: DC | PRN
  Administered 2012-02-20: 2 via TOPICAL

## 2012-02-20 MED ORDER — ROCURONIUM BROMIDE 100 MG/10ML IV SOLN
INTRAVENOUS | Status: DC | PRN
  Administered 2012-02-20: 20 mg via INTRAVENOUS
  Administered 2012-02-20: 80 mg via INTRAVENOUS

## 2012-02-20 MED ORDER — NITROGLYCERIN IN D5W 200-5 MCG/ML-% IV SOLN
2.0000 ug/min | INTRAVENOUS | Status: AC
  Administered 2012-02-20: 10 ug/min via INTRAVENOUS
  Filled 2012-02-20: qty 250

## 2012-02-20 MED ORDER — LABETALOL HCL 5 MG/ML IV SOLN
20.0000 mg | Freq: Once | INTRAVENOUS | Status: AC
  Administered 2012-02-20: 10 mg via INTRAVENOUS
  Filled 2012-02-20: qty 4

## 2012-02-20 MED ORDER — ASPIRIN 81 MG PO CHEW: 324.0000 mg | CHEWABLE_TABLET | Freq: Every day | ORAL | Status: DC

## 2012-02-20 MED ORDER — SODIUM BICARBONATE 8.4 % IV SOLN
INTRAVENOUS | Status: AC
  Filled 2012-02-20: qty 100

## 2012-02-20 MED ORDER — SODIUM CHLORIDE 0.9 % IV SOLN: 250.0000 mL | INTRAVENOUS | Status: DC

## 2012-02-20 MED ORDER — ACETAMINOPHEN 160 MG/5ML PO SOLN
975.0000 mg | Freq: Four times a day (QID) | ORAL | Status: DC
  Administered 2012-02-20 – 2012-02-21 (×2): 975 mg
  Filled 2012-02-20: qty 40.6

## 2012-02-20 MED ORDER — CEFUROXIME SODIUM 1.5 G IJ SOLR
1.5000 g | Freq: Two times a day (BID) | INTRAMUSCULAR | Status: AC
  Administered 2012-02-20 – 2012-02-22 (×4): 1.5 g via INTRAVENOUS
  Filled 2012-02-20 (×4): qty 1.5

## 2012-02-20 MED ORDER — HEPARIN SODIUM (PORCINE) 1000 UNIT/ML IJ SOLN
INTRAMUSCULAR | Status: DC | PRN
  Administered 2012-02-20: 33000 [IU] via INTRAVENOUS

## 2012-02-20 MED ORDER — PHENYLEPHRINE HCL 10 MG/ML IJ SOLN
20.0000 mg | INTRAVENOUS | Status: DC | PRN
  Administered 2012-02-20: 25 ug/min via INTRAVENOUS

## 2012-02-20 MED ORDER — BISACODYL 5 MG PO TBEC
10.0000 mg | DELAYED_RELEASE_TABLET | Freq: Every day | ORAL | Status: DC
  Administered 2012-02-21 – 2012-02-29 (×7): 10 mg via ORAL
  Filled 2012-02-20 (×2): qty 2
  Filled 2012-02-20: qty 1
  Filled 2012-02-20 (×2): qty 2

## 2012-02-20 MED ORDER — ETOMIDATE 2 MG/ML IV SOLN
INTRAVENOUS | Status: DC | PRN
  Administered 2012-02-20: 20 mg via INTRAVENOUS

## 2012-02-20 MED ORDER — SODIUM BICARBONATE 8.4 % IV SOLN
INTRAVENOUS | Status: DC | PRN
  Administered 2012-02-20: 50 meq via INTRAVENOUS

## 2012-02-20 MED ORDER — SODIUM CHLORIDE 0.9 % IV SOLN
20.0000 mg | INTRAVENOUS | Status: DC | PRN
  Administered 2012-02-20: 25 ug/min via INTRAVENOUS

## 2012-02-20 MED ORDER — TRANEXAMIC ACID (OHS) BOLUS VIA INFUSION
15.0000 mg/kg | INTRAVENOUS | Status: DC
  Filled 2012-02-20: qty 1647

## 2012-02-20 MED ORDER — TEMAZEPAM 15 MG PO CAPS: 15.0000 mg | ORAL_CAPSULE | Freq: Once | ORAL | Status: AC | PRN

## 2012-02-20 MED ORDER — LIDOCAINE HCL (CARDIAC) 20 MG/ML IV SOLN
INTRAVENOUS | Status: DC | PRN
  Administered 2012-02-20: 100 mg via INTRAVENOUS

## 2012-02-20 MED ORDER — OXYCODONE HCL 5 MG PO TABS
5.0000 mg | ORAL_TABLET | ORAL | Status: DC | PRN
  Administered 2012-02-21: 10 mg via ORAL
  Administered 2012-02-21 – 2012-02-22 (×4): 5 mg via ORAL
  Filled 2012-02-20: qty 1
  Filled 2012-02-20: qty 2
  Filled 2012-02-20 (×3): qty 1
  Filled 2012-02-20: qty 2

## 2012-02-20 MED ORDER — MILRINONE IN DEXTROSE 200-5 MCG/ML-% IV SOLN
0.2500 ug/kg/min | INTRAVENOUS | Status: DC
  Administered 2012-02-20 – 2012-02-21 (×2): 0.25 ug/kg/min via INTRAVENOUS
  Filled 2012-02-20 (×2): qty 100

## 2012-02-20 MED ORDER — 0.9 % SODIUM CHLORIDE (POUR BTL) OPTIME
TOPICAL | Status: DC | PRN
  Administered 2012-02-20: 6000 mL

## 2012-02-20 MED ORDER — MIDAZOLAM HCL 2 MG/2ML IJ SOLN
1.0000 mg | INTRAMUSCULAR | Status: DC | PRN
  Administered 2012-02-20: 2 mg via INTRAVENOUS
  Filled 2012-02-20: qty 4

## 2012-02-20 MED ORDER — METOPROLOL TARTRATE 12.5 MG HALF TABLET
12.5000 mg | ORAL_TABLET | Freq: Two times a day (BID) | ORAL | Status: DC
  Administered 2012-02-21 – 2012-02-26 (×4): 12.5 mg via ORAL
  Filled 2012-02-20 (×15): qty 1

## 2012-02-20 MED ORDER — BISACODYL 10 MG RE SUPP: 10.0000 mg | Freq: Every day | RECTAL | Status: DC

## 2012-02-20 MED ORDER — DEXMEDETOMIDINE HCL IN NACL 400 MCG/100ML IV SOLN
0.1000 ug/kg/h | INTRAVENOUS | Status: DC
  Filled 2012-02-20: qty 100

## 2012-02-20 MED ORDER — FENTANYL CITRATE 0.05 MG/ML IJ SOLN: 50.0000 ug | INTRAMUSCULAR | Status: DC | PRN

## 2012-02-20 MED ORDER — ALBUMIN HUMAN 5 % IV SOLN
INTRAVENOUS | Status: DC | PRN
  Administered 2012-02-20 (×3): via INTRAVENOUS

## 2012-02-20 MED ORDER — VANCOMYCIN HCL IN DEXTROSE 1-5 GM/200ML-% IV SOLN
1000.0000 mg | Freq: Two times a day (BID) | INTRAVENOUS | Status: AC
  Administered 2012-02-20 – 2012-02-22 (×4): 1000 mg via INTRAVENOUS
  Filled 2012-02-20 (×4): qty 200

## 2012-02-20 MED ORDER — HEPARIN SODIUM (PORCINE) 1000 UNIT/ML IJ SOLN
INTRAMUSCULAR | Status: AC
  Filled 2012-02-20: qty 1

## 2012-02-20 MED ORDER — VECURONIUM BROMIDE 10 MG IV SOLR
INTRAVENOUS | Status: DC | PRN
  Administered 2012-02-20 (×2): 5 mg via INTRAVENOUS
  Administered 2012-02-20: 10 mg via INTRAVENOUS
  Administered 2012-02-20 (×2): 5 mg via INTRAVENOUS
  Administered 2012-02-20: 10 mg via INTRAVENOUS

## 2012-02-20 MED ORDER — LEVALBUTEROL HCL 1.25 MG/0.5ML IN NEBU
1.2500 mg | INHALATION_SOLUTION | Freq: Three times a day (TID) | RESPIRATORY_TRACT | Status: DC
  Administered 2012-02-20 – 2012-02-27 (×20): 1.25 mg via RESPIRATORY_TRACT
  Filled 2012-02-20 (×23): qty 0.5

## 2012-02-20 MED ORDER — MIDAZOLAM HCL 2 MG/2ML IJ SOLN
2.0000 mg | INTRAMUSCULAR | Status: DC | PRN
  Administered 2012-02-20 – 2012-02-21 (×3): 2 mg via INTRAVENOUS
  Filled 2012-02-20: qty 2

## 2012-02-20 MED ORDER — SODIUM CHLORIDE 0.9 % IJ SOLN: 3.0000 mL | INTRAMUSCULAR | Status: DC | PRN

## 2012-02-20 MED ORDER — HEMOSTATIC AGENTS (NO CHARGE) OPTIME
TOPICAL | Status: DC | PRN
  Administered 2012-02-20 (×2): 1 via TOPICAL

## 2012-02-20 MED ORDER — MIDAZOLAM HCL 5 MG/5ML IJ SOLN
INTRAMUSCULAR | Status: DC | PRN
  Administered 2012-02-20: 3 mg via INTRAVENOUS
  Administered 2012-02-20: 2 mg via INTRAVENOUS
  Administered 2012-02-20: 5 mg via INTRAVENOUS
  Administered 2012-02-20: 3 mg via INTRAVENOUS
  Administered 2012-02-20: 8 mg via INTRAVENOUS
  Administered 2012-02-20: 5 mg via INTRAVENOUS
  Administered 2012-02-20: 4 mg via INTRAVENOUS

## 2012-02-20 MED ORDER — MONTELUKAST SODIUM 10 MG PO TABS
10.0000 mg | ORAL_TABLET | Freq: Every day | ORAL | Status: DC
  Administered 2012-02-21 – 2012-02-29 (×9): 10 mg via ORAL
  Filled 2012-02-20 (×9): qty 1

## 2012-02-20 MED ORDER — MILRINONE IN DEXTROSE 200-5 MCG/ML-% IV SOLN
INTRAVENOUS | Status: DC | PRN
  Administered 2012-02-20: 50 ug/kg/min via INTRAVENOUS

## 2012-02-20 MED ORDER — POTASSIUM CHLORIDE 10 MEQ/50ML IV SOLN
10.0000 meq | INTRAVENOUS | Status: AC
  Administered 2012-02-20 (×3): 10 meq via INTRAVENOUS

## 2012-02-20 MED ORDER — POTASSIUM CHLORIDE 10 MEQ/50ML IV SOLN
10.0000 meq | INTRAVENOUS | Status: AC
  Administered 2012-02-21 (×3): 10 meq via INTRAVENOUS

## 2012-02-20 MED ORDER — SODIUM CHLORIDE 0.9 % IV SOLN
INTRAVENOUS | Status: DC | PRN
  Administered 2012-02-20: 04:00:00 via INTRAVENOUS

## 2012-02-20 MED ORDER — FAMOTIDINE IN NACL 20-0.9 MG/50ML-% IV SOLN
20.0000 mg | Freq: Two times a day (BID) | INTRAVENOUS | Status: DC
  Administered 2012-02-20: 20 mg via INTRAVENOUS
  Filled 2012-02-20: qty 50

## 2012-02-20 MED ORDER — PANTOPRAZOLE SODIUM 40 MG PO TBEC
40.0000 mg | DELAYED_RELEASE_TABLET | Freq: Every day | ORAL | Status: DC
  Administered 2012-02-22 – 2012-02-29 (×8): 40 mg via ORAL
  Filled 2012-02-20 (×8): qty 1

## 2012-02-20 MED ORDER — LACTATED RINGERS IV SOLN: 500.0000 mL | Freq: Once | INTRAVENOUS | Status: AC | PRN

## 2012-02-20 MED ORDER — MORPHINE SULFATE 2 MG/ML IJ SOLN
2.0000 mg | INTRAMUSCULAR | Status: DC | PRN
  Administered 2012-02-21 (×4): 2 mg via INTRAVENOUS
  Filled 2012-02-20 (×3): qty 1
  Filled 2012-02-20: qty 2

## 2012-02-20 MED ORDER — VANCOMYCIN HCL IN DEXTROSE 1-5 GM/200ML-% IV SOLN
INTRAVENOUS | Status: AC
  Filled 2012-02-20: qty 200

## 2012-02-20 MED ORDER — COAGULATION FACTOR VIIA RECOMB 1 MG IV SOLR
5.0000 mg | Freq: Once | INTRAVENOUS | Status: AC
  Administered 2012-02-20: 5 mg via INTRAVENOUS
  Filled 2012-02-20: qty 5

## 2012-02-20 MED ORDER — LACTATED RINGERS IV SOLN
INTRAVENOUS | Status: DC | PRN
  Administered 2012-02-20: 04:00:00 via INTRAVENOUS

## 2012-02-20 MED ORDER — TRANEXAMIC ACID 100 MG/ML IV SOLN
1.5000 mg/kg/h | INTRAVENOUS | Status: DC
  Filled 2012-02-20: qty 25

## 2012-02-20 MED ORDER — SODIUM CHLORIDE 0.9 % IJ SOLN
OROMUCOSAL | Status: DC | PRN
  Administered 2012-02-20 (×4): via TOPICAL

## 2012-02-20 MED ORDER — EPINEPHRINE HCL 1 MG/ML IJ SOLN
0.5000 ug/min | INTRAMUSCULAR | Status: DC
  Filled 2012-02-20: qty 4

## 2012-02-20 MED ORDER — LABETALOL HCL 5 MG/ML IV SOLN
10.0000 mg | Freq: Once | INTRAVENOUS | Status: AC
  Administered 2012-02-20: 10 mg via INTRAVENOUS
  Filled 2012-02-20: qty 4

## 2012-02-20 MED ORDER — PROTAMINE SULFATE 10 MG/ML IV SOLN
INTRAVENOUS | Status: DC | PRN
  Administered 2012-02-20: 300 mg via INTRAVENOUS
  Administered 2012-02-20: 40 mg via INTRAVENOUS

## 2012-02-20 MED ORDER — ESMOLOL HCL-SODIUM CHLORIDE 2000 MG/100ML IV SOLN
25.0000 ug/kg/min | Freq: Once | INTRAVENOUS | Status: DC
  Filled 2012-02-20: qty 100

## 2012-02-20 MED ORDER — SODIUM CHLORIDE 0.9 % IV SOLN
0.3000 ug/kg | Freq: Once | INTRAVENOUS | Status: AC
  Administered 2012-02-20: 32.94 ug via INTRAVENOUS
  Filled 2012-02-20: qty 8.2

## 2012-02-20 MED ORDER — ALBUTEROL SULFATE HFA 108 (90 BASE) MCG/ACT IN AERS
INHALATION_SPRAY | RESPIRATORY_TRACT | Status: DC | PRN
  Administered 2012-02-20: 2 via RESPIRATORY_TRACT

## 2012-02-20 MED ORDER — LIDOCAINE HCL (CARDIAC) 20 MG/ML IV SOLN
INTRAVENOUS | Status: AC
  Filled 2012-02-20: qty 10

## 2012-02-20 MED ORDER — ONDANSETRON HCL 4 MG/2ML IJ SOLN
4.0000 mg | Freq: Four times a day (QID) | INTRAMUSCULAR | Status: DC | PRN
  Administered 2012-02-22: 4 mg via INTRAVENOUS
  Filled 2012-02-20: qty 2

## 2012-02-20 MED ORDER — MAGNESIUM SULFATE 40 MG/ML IJ SOLN
4.0000 g | Freq: Once | INTRAMUSCULAR | Status: AC
  Administered 2012-02-20: 4 g via INTRAVENOUS
  Filled 2012-02-20: qty 100

## 2012-02-20 MED ORDER — DOPAMINE-DEXTROSE 3.2-5 MG/ML-% IV SOLN
2.0000 ug/kg/min | INTRAVENOUS | Status: AC
  Administered 2012-02-20: 5 ug/kg/min via INTRAVENOUS

## 2012-02-20 MED ORDER — LACTATED RINGERS IV SOLN
INTRAVENOUS | Status: DC
  Administered 2012-02-20: 17:00:00 via INTRAVENOUS

## 2012-02-20 SURGICAL SUPPLY — 129 items
ADAPTER CARDIO PERF ANTE/RETRO (ADAPTER) ×3 IMPLANT
BAG DECANTER FOR FLEXI CONT (MISCELLANEOUS) ×3 IMPLANT
BENZOIN TINCTURE PRP APPL 2/3 (GAUZE/BANDAGES/DRESSINGS) IMPLANT
BLADE STERNUM SYSTEM 6 (BLADE) ×3 IMPLANT
BLADE SURG 15 STRL LF DISP TIS (BLADE) ×2 IMPLANT
BLADE SURG 15 STRL SS (BLADE) ×4
BLADE SURG ROTATE 9660 (MISCELLANEOUS) IMPLANT
CANISTER SUCTION 2500CC (MISCELLANEOUS) ×3 IMPLANT
CANN PRFSN .5XCNCT 15X34-48 (MISCELLANEOUS)
CANNULA ARTERIAL 007325 (MISCELLANEOUS) IMPLANT
CANNULA ARTERIAL 14F 007324 (MISCELLANEOUS) IMPLANT
CANNULA ARTERIAL 18F 007308 (MISCELLANEOUS) IMPLANT
CANNULA ARTERIAL 20F L7309 (MISCELLANEOUS) IMPLANT
CANNULA ARTERIAL 22F 007310 (MISCELLANEOUS) IMPLANT
CANNULA ARTERIAL 24F 007311 (MISCELLANEOUS) IMPLANT
CANNULA GUNDRY RCSP 15FR (MISCELLANEOUS) ×3 IMPLANT
CANNULA PRFSN .5XCNCT 15X34-48 (MISCELLANEOUS) IMPLANT
CANNULA VEN 2 STAGE (MISCELLANEOUS)
CATH FOLEY 2WAY SLVR 30CC 20FR (CATHETERS) ×3 IMPLANT
CATH RETROPLEGIA CORONARY 14FR (CATHETERS) ×3 IMPLANT
CATH THORACIC 28FR (CATHETERS) IMPLANT
CATH THORACIC 36FR RT ANG (CATHETERS) ×3 IMPLANT
CATH/SQUID NICHOLS JEHLE COR (CATHETERS) ×3 IMPLANT
CAUTERY EYE LOW TEMP 1300F FIN (OPHTHALMIC RELATED) ×6 IMPLANT
CLIP TI MEDIUM 6 (CLIP) ×3 IMPLANT
CLIP TI WIDE RED SMALL 24 (CLIP) IMPLANT
CLIP TI WIDE RED SMALL 6 (CLIP) IMPLANT
CLOSURE WOUND 1/2 X4 (GAUZE/BANDAGES/DRESSINGS)
CLOTH BEACON ORANGE TIMEOUT ST (SAFETY) ×3 IMPLANT
CONN 1/2X1/2X1/2  BEN (MISCELLANEOUS)
CONN 1/2X1/2X1/2 BEN (MISCELLANEOUS) IMPLANT
CONN 3/8X3/8 GISH STERILE (MISCELLANEOUS) IMPLANT
CONN Y 3/8X3/8X3/8  BEN (MISCELLANEOUS)
CONN Y 3/8X3/8X3/8 BEN (MISCELLANEOUS) IMPLANT
CRADLE DONUT ADULT HEAD (MISCELLANEOUS) IMPLANT
DERMABOND ADHESIVE PROPEN (GAUZE/BANDAGES/DRESSINGS) ×2
DERMABOND ADVANCED .7 DNX6 (GAUZE/BANDAGES/DRESSINGS) ×1 IMPLANT
DRSG COVADERM 4X14 (GAUZE/BANDAGES/DRESSINGS) ×3 IMPLANT
ELECT BLADE 4.0 EZ CLEAN MEGAD (MISCELLANEOUS) ×3
ELECT REM PT RETURN 9FT ADLT (ELECTROSURGICAL) ×6
ELECTRODE BLDE 4.0 EZ CLN MEGD (MISCELLANEOUS) ×1 IMPLANT
ELECTRODE REM PT RTRN 9FT ADLT (ELECTROSURGICAL) ×2 IMPLANT
FELT TEFLON 6X6 (MISCELLANEOUS) ×3 IMPLANT
GLOVE BIO SURGEON STRL SZ 6 (GLOVE) ×9 IMPLANT
GLOVE BIO SURGEON STRL SZ 6.5 (GLOVE) IMPLANT
GLOVE BIO SURGEON STRL SZ7 (GLOVE) ×6 IMPLANT
GLOVE BIO SURGEON STRL SZ7.5 (GLOVE) ×9 IMPLANT
GLOVE BIO SURGEONS STRL SZ 6.5 (GLOVE)
GLOVE BIOGEL PI IND STRL 7.0 (GLOVE) ×4 IMPLANT
GLOVE BIOGEL PI INDICATOR 7.0 (GLOVE) ×8
GOWN STRL NON-REIN LRG LVL3 (GOWN DISPOSABLE) ×18 IMPLANT
GRAFT HEMASHIELD 8MM (Vascular Products) ×2 IMPLANT
GRAFT VASC STRG 30X8KNIT (Vascular Products) ×1 IMPLANT
GRAFT WOVEN D/V 30DX30L (Vascular Products) ×3 IMPLANT
GRAFT WOVEN D/V 32DX30L (Vascular Products) ×3 IMPLANT
HEMOSTAT POWDER SURGIFOAM 1G (HEMOSTASIS) IMPLANT
HEMOSTAT SURGICEL 2X14 (HEMOSTASIS) ×3 IMPLANT
HEMOSTAT SURGICEL 2X4 FIBR (HEMOSTASIS) ×6 IMPLANT
INSERT FOGARTY SM (MISCELLANEOUS) ×3 IMPLANT
INSERT FOGARTY XLG (MISCELLANEOUS) ×3 IMPLANT
KIT BASIN OR (CUSTOM PROCEDURE TRAY) ×3 IMPLANT
KIT PAIN CUSTOM (MISCELLANEOUS) ×3 IMPLANT
KIT ROOM TURNOVER OR (KITS) ×3 IMPLANT
KIT SUCTION CATH 14FR (SUCTIONS) ×3 IMPLANT
LINE VENT (MISCELLANEOUS) ×3 IMPLANT
LOOP VESSEL SUPERMAXI WHITE (MISCELLANEOUS) ×3 IMPLANT
NEEDLE AORTIC AIR ASPIRATING (NEEDLE) IMPLANT
NS IRRIG 1000ML POUR BTL (IV SOLUTION) ×18 IMPLANT
PACK OPEN HEART (CUSTOM PROCEDURE TRAY) ×3 IMPLANT
PAD ARMBOARD 7.5X6 YLW CONV (MISCELLANEOUS) ×6 IMPLANT
PAD SHARPS MAGNETIC DISPOSAL (MISCELLANEOUS) ×3 IMPLANT
PENCIL BUTTON HOLSTER BLD 10FT (ELECTRODE) ×3 IMPLANT
SEALANT SURG COSEAL 8ML (VASCULAR PRODUCTS) ×3 IMPLANT
SET CARDIOPLEGIA MPS 5001102 (MISCELLANEOUS) ×3 IMPLANT
SPONGE GAUZE 4X4 12PLY (GAUZE/BANDAGES/DRESSINGS) ×3 IMPLANT
SPONGE LAP 18X18 X RAY DECT (DISPOSABLE) IMPLANT
SPONGE LAP 4X18 X RAY DECT (DISPOSABLE) IMPLANT
STAPLER VISISTAT 35W (STAPLE) ×6 IMPLANT
STOPCOCK 4 WAY LG BORE MALE ST (IV SETS) IMPLANT
STOPCOCK MORSE 400PSI 3WAY (MISCELLANEOUS) ×6 IMPLANT
STRIP CLOSURE SKIN 1/2X4 (GAUZE/BANDAGES/DRESSINGS) IMPLANT
SUCKER INTRACARDIAC WEIGHTED (SUCKER) ×3 IMPLANT
SURGIFLO TRUKIT (HEMOSTASIS) ×3 IMPLANT
SUT ETHIBOND NAB MH 2-0 36IN (SUTURE) ×3 IMPLANT
SUT MNCRL AB 3-0 PS2 18 (SUTURE) IMPLANT
SUT PROLENE 3 0 RB 1 (SUTURE) ×3 IMPLANT
SUT PROLENE 3 0 SH DA (SUTURE) ×6 IMPLANT
SUT PROLENE 3 0 SH1 36 (SUTURE) ×57 IMPLANT
SUT PROLENE 4 0 RB 1 (SUTURE) ×102
SUT PROLENE 4 0 SH DA (SUTURE) ×48 IMPLANT
SUT PROLENE 4-0 RB1 .5 CRCL 36 (SUTURE) ×51 IMPLANT
SUT PROLENE 5 0 C 1 36 (SUTURE) ×27 IMPLANT
SUT PROLENE 6 0 C 1 30 (SUTURE) ×12 IMPLANT
SUT PROLENE 6 0 CC (SUTURE) IMPLANT
SUT PROLENE 7 0 BV 1 (SUTURE) IMPLANT
SUT PROLENE 7 0 BV1 MDA (SUTURE) IMPLANT
SUT SILK 2 0 SH CR/8 (SUTURE) ×3 IMPLANT
SUT SILK 3 0 (SUTURE) ×2
SUT SILK 3-0 18XBRD TIE 12 (SUTURE) ×1 IMPLANT
SUT STEEL STERNAL CCS#1 18IN (SUTURE) ×6 IMPLANT
SUT STEEL SZ 6 DBL 3X14 BALL (SUTURE) ×6 IMPLANT
SUT VIC AB 1 CT1 18XCR BRD 8 (SUTURE) IMPLANT
SUT VIC AB 1 CT1 8-18 (SUTURE)
SUT VIC AB 1 CTX 27 (SUTURE) ×6 IMPLANT
SUT VIC AB 1 CTX 36 (SUTURE) ×2
SUT VIC AB 1 CTX36XBRD ANBCTR (SUTURE) ×1 IMPLANT
SUT VIC AB 2-0 CT1 18 (SUTURE) ×3 IMPLANT
SUT VIC AB 2-0 CT1 27 (SUTURE)
SUT VIC AB 2-0 CT1 TAPERPNT 27 (SUTURE) IMPLANT
SUT VIC AB 2-0 CTX 27 (SUTURE) ×3 IMPLANT
SUT VIC AB 2-0 CTX 36 (SUTURE) ×3 IMPLANT
SUT VIC AB 3-0 SH 27 (SUTURE)
SUT VIC AB 3-0 SH 27X BRD (SUTURE) IMPLANT
SUT VIC AB 3-0 X1 27 (SUTURE) ×6 IMPLANT
SUT VICRYL 4-0 PS2 18IN ABS (SUTURE) IMPLANT
SUTURE E-PAK OPEN HEART (SUTURE) ×3 IMPLANT
SYR 10ML KIT SKIN ADHESIVE (MISCELLANEOUS) ×9 IMPLANT
SYR 30ML LL (SYRINGE) ×3 IMPLANT
SYR 50ML SLIP (SYRINGE) ×3 IMPLANT
SYSTEM SAHARA CHEST DRAIN ATS (WOUND CARE) ×3 IMPLANT
TAPE CLOTH SURG 4X10 WHT LF (GAUZE/BANDAGES/DRESSINGS) ×3 IMPLANT
TOWEL OR 17X24 6PK STRL BLUE (TOWEL DISPOSABLE) ×3 IMPLANT
TOWEL OR 17X26 10 PK STRL BLUE (TOWEL DISPOSABLE) ×3 IMPLANT
TRAY CATH LUMEN 1 20CM STRL (SET/KITS/TRAYS/PACK) ×3 IMPLANT
TUBE CONNECTING 12'X1/4 (SUCTIONS) ×1
TUBE CONNECTING 12X1/4 (SUCTIONS) ×2 IMPLANT
TUBING ART PRESS 12 MALE/MALE (MISCELLANEOUS) ×6 IMPLANT
WATER STERILE IRR 1000ML POUR (IV SOLUTION) ×6 IMPLANT
YANKAUER SUCT BULB TIP NO VENT (SUCTIONS) ×3 IMPLANT

## 2012-02-20 NOTE — Brief Op Note (Signed)
02/19/2012 - 02/20/2012  5:09 PM  PATIENT:  [name]  61 y.o. male  PRE-OPERATIVE DIAGNOSIS:  thoracic dissection  POST-OPERATIVE DIAGNOSIS:  thoracic dissection  PROCEDURE:  Procedure(s) (LRB) with comments: THORACIC ASCENDING ANEURYSM REPAIR (AAA) (N/A) Grafting of ascending aorta and hemi-arch replacement under circulatory arrest, antegrade cerebral perfusion via right axillary artery cannulation, using 30 mm heme shield Dacron graft SURGEON:  Surgeon(s) and Role:    * Kerin Perna, MD - Primary  PHYSICIAN ASSISTANT: Coral Ceo PA-C  ASSISTANTS: {Terry  Val Eagle Waogner RNFA   ANESTHESIA:   general  EBL:  Total I/O In: 7589.7 [I.V.:3323.7; AVWUJ:8119; IV Piggyback:1400] Out: 1700 [Urine:1700]  BLOOD ADMINISTERED: Packed cells-1                                             Platelets-3                                             FFP-2  DRAINS: 2 mediastinal drains anterior and posterior   LOCAL MEDICATIONS USED:  None  SPECIMEN:  A scending aorta  DISPOSITION OF SPECIMEN:  Pathology  COUNTS:  Correct  TOURNIQUET:  None  DICTATION: .Pending  PLAN OF CARE: Admit to inpatient   PATIENT DISPOSITION:  ICU - intubated and critically ill.   Delay start of Pharmacological VTE agent (>24hrs) due to surgical blood loss or risk of bleeding: Yes

## 2012-02-20 NOTE — OR Nursing (Signed)
8mm Hemashield graft, REF [id number], LOT [id number], VWU [id number] was removed prior to closure and not implanted.  Skip Estimable RN

## 2012-02-20 NOTE — Consult Note (Signed)
[address]            [place] [address]          [phone]       [name] [facility] Medical Record #[id number] Date of Birth: 1950/09/29  No ref. provider found Rene Paci, MD  Chief Complaint:    Chief Complaint  Patient presents with  . Code Stroke    History of Present Illness:     61 year old Caucasian male smoker presented to the ED with transient neurologic symptoms of headache, difficulty with speech, leg weakness. A head CT scan was performed which was negative and his symptoms resolved. He started having chest pain and a CT scan was performed to rule out pulmonary embolus. A second CTA of the thoracic and abdominal aorta demonstrated a type A dissection with the origin in the ascending aorta above the aortic valve continuing down to just above the aortic bifurcation. The ascending aorta measures 5 cm diameter. The abdominal aorta has a 6.1 cm infrarenal calcified aneurysm. The iliac arteries appear to be normal.  The patient's 12-lead EKG shows no acute abnormalities with sinus rhythm. Troponin is mildly elevated at 0.4 and d-dimer is elevated greater than 20.  The patient denies history of hypertension. The patient i denies family history of dissection or aortic aneurysm disease. 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 OBSTRUCTIVE SLEEP APNEA 02/2008 sleep study  . MIGRAINE HEADACHE   . ERECTILE DYSFUNCTION, ORGANIC   . Unspecified vitamin D deficiency   . HYPERLIPIDEMIA   . ASTHMA   . ALLERGIC RHINITIS     Past Surgical History  Procedure Date  . Nasal fx without repair     History  Smoking status  . Current Everyday Smoker -- 0.1 packs/day  Smokeless tobacco  . Not on file  Comment: 10 cigs/day    History  Alcohol Use  . Yes    Rarely    History   Social History  . Marital Status: Legally  Separated    Spouse Name: N/A    Number of Children: N/A  . Years of Education: N/A   Occupational History  . Not on file.   Social History Main Topics  . Smoking status: Current Everyday Smoker -- 0.1 packs/day  . Smokeless tobacco: Not on file   Comment: 10 cigs/day  . Alcohol Use: Yes     Rarely  . Drug Use: Not on file  . Sexually Active: Not on file   Other Topics Concern  . Not on file   Social History Narrative  . No narrative on file    Allergies  Allergen Reactions  . Testosterone Shortness Of Breath    Medication:Androgel Pt states trouble breathing at night    Current Facility-Administered Medications  Medication Dose Route Frequency Provider Last Rate Last Dose  . 0.9 %  sodium chloride infusion   Intravenous Continuous Geoffery Lyons, MD 100 mL/hr at 02/19/12 2108    . cefUROXime (ZINACEF) 1.5 g in dextrose 5 % 50 mL IVPB  1.5 g Intravenous To OR Flint Melter, MD      . cefUROXime (ZINACEF) 750 mg in dextrose 5 % 50 mL IVPB  750 mg Intravenous To OR Flint Melter, MD      . dexmedetomidine (  PRECEDEX) 400 mcg / 100 mL infusion  0.1-0.7 mcg/kg/hr Intravenous To OR Flint Melter, MD      . DOPamine (INTROPIN) 800 mg in dextrose 5 % 250 mL infusion  2-20 mcg/kg/min Intravenous To OR Flint Melter, MD      . EPINEPHrine (ADRENALIN) 4,000 mcg in dextrose 5 % 250 mL infusion  0.5-20 mcg/min Intravenous To OR Flint Melter, MD      . esmolol (BREVIBLOC)  2000 mg / 100 mL infusion  25-300 mcg/kg/min Intravenous Once Glynn Octave, MD      . insulin regular (NOVOLIN R,HUMULIN R) 1 Units/mL in sodium chloride 0.9 % 100 mL infusion   Intravenous To OR Flint Melter, MD      . iohexol (OMNIPAQUE) 350 MG/ML injection 100 mL  100 mL Intravenous Once PRN Medication Radiologist, MD   100 mL at 02/19/12 2333  . iohexol (OMNIPAQUE) 350 MG/ML injection 75 mL  75 mL Intravenous Once PRN Medication Radiologist, MD   75 mL at 02/20/12 0047  . labetalol  (NORMODYNE,TRANDATE) injection 10 mg  10 mg Intravenous Once Glynn Octave, MD   10 mg at 02/20/12 0059  . labetalol (NORMODYNE,TRANDATE) injection 20 mg  20 mg Intravenous Once Glynn Octave, MD   20 mg at 02/20/12 0305  . magnesium sulfate (IV Push/IM) injection 40 mEq  40 mEq Other To OR Flint Melter, MD      . morphine 4 MG/ML injection 4 mg  4 mg Intravenous Once Glynn Octave, MD   4 mg at 02/19/12 2107  . morphine 4 MG/ML injection 4 mg  4 mg Intravenous Once Glynn Octave, MD   4 mg at 02/19/12 2336  . nitroGLYCERIN (NITROGLYN) 2 % ointment 1 inch  1 inch Topical Once Glynn Octave, MD   1 inch at 02/19/12 2336  . nitroGLYCERIN (NITROSTAT) SL tablet 0.4 mg  0.4 mg Sublingual Q5 min PRN Gavin Pound. Ghim, MD   0.4 mg at 02/19/12 2057  . nitroGLYCERIN 0.2 mg/mL in dextrose 5 % infusion  2-200 mcg/min Intravenous To OR Flint Melter, MD      . nitroglycerin-nicardipine-HEPARIN-sodium bicarbonate irrigation for artery spasm   Irrigation To OR Flint Melter, MD      . ondansetron [facility]) injection 4 mg  4 mg Intravenous Once Glynn Octave, MD   4 mg at 02/19/12 2107  . phenylephrine (NEO-SYNEPHRINE) 20,000 mcg in dextrose 5 % 250 mL infusion  30-200 mcg/min Intravenous To OR Flint Melter, MD      . potassium chloride injection 80 mEq  80 mEq Other To OR Flint Melter, MD      . tranexamic acid (CYKLOKAPRON) bolus via infusion - over 30 minutes 1,647 mg  15 mg/kg Intravenous To OR Flint Melter, MD      . tranexamic acid (CYKLOKAPRON) pump prime solution 220 mg  2 mg/kg Intracatheter To OR Flint Melter, MD      . tranexamic acid (CYKLOLAPRON) 2,500 mg in sodium chloride 0.9 % 250 mL infusion  1.5 mg/kg/hr Intravenous To OR Flint Melter, MD      . vancomycin (VANCOCIN) 1,500 mg in sodium chloride 0.9 % 250 mL IVPB  1,500 mg Intravenous To OR Flint Melter, MD       Current Outpatient Prescriptions  Medication Sig Dispense Refill  . ALPRAZolam (XANAX) 1 MG tablet  Take 1 mg by mouth at bedtime as needed. For insomnia      .  cholecalciferol (VITAMIN D) 1000 UNITS tablet Take 2,000 Units by mouth daily.      . Cyanocobalamin (VITAMIN B-12 PO) Take 1 tablet by mouth daily.      [name] EPINEPHrine (EPI-PEN) 0.3 mg/0.3 mL DEVI Inject 0.3 mg into the muscle as needed. For anaphylaxis       . ezetimibe-simvastatin (VYTORIN) 10-40 MG per tablet Take 0.5 tablets by mouth daily.      . fexofenadine (ALLEGRA) 180 MG tablet Take 180 mg by mouth daily.        . fluticasone (FLONASE) 50 MCG/ACT nasal spray Place 2 sprays into the nose daily.      . montelukast (SINGULAIR) 10 MG tablet Take 10 mg by mouth daily.      [name] omega-3 acid ethyl esters (LOVAZA) 1 G capsule Take 1 g by mouth 2 (two) times daily.      . predniSONE (DELTASONE) 5 MG tablet Take 5 mg by mouth daily as needed. For asthma flare up      . rosuvastatin (CRESTOR) 10 MG tablet Take 10 mg by mouth daily.      [name] tretinoin (RETIN-A) 0.1 % cream Apply 1 application topically daily as needed. For acne or rash         Family History  Problem Relation Age of Onset  . Diabetes Mother   . Heart disease Maternal Uncle   . Heart disease Paternal Uncle   . Heart disease Cousin   . Lung cancer Cousin     2 cousins     Review of Systems:  The patient takes low-dose prednisone 2.5 mg frequently for nasal allergy. The patient has history of migraine headache, hyperlipidemia, and he smokes a pack of cigarettes a day. He is been recently using electronic cigarette. He has a history of diverticular disease of the colon and some lower GI bleeding but has not been colonoscoped. The patient is right-hand dominant. He denies any difficulty swallowing. Patient denies any history of thoracic trauma. There is no previous surgical history.   Cardiac Review of Systems: Y or N  Chest Pain [  Y.  ]  Resting SOB [N.   ] Exertional SOB  [N.  ]  Orthopnea [ N. ]   Pedal Edema [and   ]    Palpitations and ] Syncope  [ N. ]     Presyncope [  Y. ]  General Review of Systems: [Y] = yes [  ]=no Constitional: recent weight change [  ]; anorexia [  ]; fatigue [  ]; nausea [  ]; night sweats [  ]; fever [  ]; or chills [  ];                                                                                                                                          Dental: poor dentition[  ]; Last Dentist visit: No  dental complaints  Eye : blurred vision [  ]; diplopia [   ]; vision changes [  ];  Amaurosis fugax[  ]; Resp: cough [  ];  wheezing[  ];  hemoptysis[  ]; shortness of breath[  ]; paroxysmal nocturnal dyspnea[  ]; dyspnea on exertion[  ]; or orthopnea[  ];  GI:  gallstones[  ], vomiting[  ];  dysphagia[  ]; melena[  ];  hematochezia [  ]; heartburn[  ];   Hx of  Colonoscopy[  ]; GU: kidney stones [  ]; hematuria[  ];   dysuria [  ];  nocturia[  ];  history of     obstruction [  ];                 Skin: rash, swelling[  ];, hair loss[  ];  peripheral edema[  ];  or itching[  ]; Musculosketetal: myalgias[  ];  joint swelling[  ];  joint erythema[  ];  joint pain[  ];  back pain[  ];  Heme/Lymph: bruising[  ];  bleeding[ N. ];  anemia[  ];  Neuro: TIA[  ];  headaches[Y. ];  stroke[  ];  vertigo[  ];  seizures[  ];   paresthesias[  ];  difficulty walking[  ];  Psych:depression[  ]; anxiety[  ];  Endocrine: diabetes[  ];  thyroid dysfunction[  ];  Immunizations: Flu [  ]; Pneumococcal[  ];  Other:  Physical Exam: BP 142/82  Pulse 77  Temp 97.6 F (36.4 C) (Oral)  Resp 16  Wt 242 lb 1 oz (109.8 kg)  SpO2 96%  Gen middle-aged male the ED anxious but responsive HEENT normocephalic pupils equal dentition good Neck no JVD mass or bruit carotid pulses 2+ Thorax no deformity no tenderness clear breath sounds bilaterally Cardiac regular rhythm grade 2/6 diastolic murmur of aortic insufficiency no gallop Abdomen obese Nonpalpable mass no pulsatile mass appreciated Vascular positive pulses in all extremities left pedal  pulses 1+ right pedal pulses 2+ Neurologic no focal deficit   Diagnostic Studies & Laboratory data:   CT scan CTA of the thoracic aorta reviewed demonstrating type A dissection  Recent Radiology Findings:   Ct Head Wo Contrast  02/19/2012  *RADIOLOGY REPORT*  Clinical Data: 61 year old male with headache, visual changes. Altered mental status.  CT HEAD WITHOUT CONTRAST  Technique:  Contiguous axial images were obtained from the base of the skull through the vertex without contrast.  Comparison: None.  Findings: Visualized orbit soft tissues are within normal limits. Mildly asymmetric temporalis muscles, but no definite scalp hematoma or scalp soft tissue abnormality.  Visualized paranasal sinuses and mastoids are clear.  No acute osseous abnormality identified.  Cerebral volume is within normal limits for age.  No midline shift, ventriculomegaly, mass effect, evidence of mass lesion, intracranial hemorrhage or evidence of cortically based acute infarction.  Gray-white matter differentiation is within normal limits throughout the brain.  No suspicious intracranial vascular hyperdensity.  IMPRESSION: Normal noncontrast CT appearance of the brain.  Critical Value/emergent results were called by telephone at the time of interpretation on 02/19/2012 at 2009 hours to Dr. Manus Gunning, who verbally acknowledged these results.   Original Report Authenticated By: Harley Hallmark, M.D.    Ct Angio Chest W/cm &/or Wo Cm  02/20/2012  *RADIOLOGY REPORT*  Clinical Data: Chest pain.  Shortness of breath.  Weakness.  CT ANGIOGRAPHY CHEST  Technique:  Multidetector CT imaging of the chest using the standard protocol during bolus administration  of intravenous contrast. Multiplanar reconstructed images including MIPs were obtained and reviewed to evaluate the vascular anatomy.,  Contrast:  100 ml Omnipaque-350  Comparison: None.  Findings: Satisfactory opacification of the pulmonary arteries noted, and there is no evidence of  pulmonary emboli.  An ascending thoracic aortic aneurysm is seen which measures 5.2 cm in maximum diameter.  A Type A thoracic aortic dissection is seen involving both the ascending and descending aorta.  Both true and false lumens are patent.  No evidence of hilar or mediastinal masses.  No adenopathy seen elsewhere within the thorax.  No evidence of pleural or pericardial effusion.  Mild para septal emphysema noted.  No evidence of pulmonary infiltrate or mass.  No central endobronchial lesions identified.  Hepatic steatosis incidentally noted.  IMPRESSION:  1.  No evidence of pulmonary embolism. 2.  5.2 cm ascending thoracic aortic aneurysm, with type A thoracic aortic dissection.  *RADIOLOGY REPORT*  CT ANGIOGRAPHY ABDOMEN AND PELVIS  Technique:  Multidetector CT imaging of the abdomen and pelvis was performed using the standard protocol during bolus administration of intravenous contrast.  Multiplanar reconstructed images including MIPs were obtained and reviewed to evaluate the vascular anatomy.  Contrast:   75 ml Omnipaque-350  Comparison:   None.  Findings:  Aortic dissection extends throughout the abdominal aorta to just above the aortic bifurcation.  There is no evidence of dissection involving the iliac arteries.  An infrarenal abdominal aortic aneurysm is seen which measures 6.3 cm in maximum diameter. No evidence of aneurysm leak or rupture.  Hepatic steatosis noted, but no liver masses are identified. Gallbladder is unremarkable.  The other abdominal parenchymal organs are normal in appearance.  No soft tissue masses or lymphadenopathy identified.  No inflammatory process or abnormal fluid collections are identified within the abdomen or pelvis. Diverticulosis is seen involving the sigmoid colon, however there is no evidence of diverticulitis.   Review of the MIP images confirms the above findings.  IMPRESSION:  1.  Abdominal aortic dissection terminates above the aortic bifurcation.  No evidence of  dissection involving the iliac arteries. 2.  6.3 cm infrarenal abdominal aortic aneurysm.  No evidence of aneurysm leak or rupture. 3.  Hepatic steatosis and diverticulosis incidentally noted.  Critical Value/emergent results were called by telephone at the time of interpretation on 02/20/2012 at 0100 hours to Dr. Manus Gunning in the emergency department, who verbally acknowledged these results.   Original Report Authenticated By: Danae Orleans, M.D.    Dg Chest Portable 1 View  02/19/2012  *RADIOLOGY REPORT*  Clinical Data: 61 year old male Code stroke.  Chest pain.  PORTABLE CHEST - 1 VIEW  Comparison: None.  Findings: Portable upright AP view at 2034 hours.  Lung volumes within normal limits.  Cardiac size and mediastinal contours are within normal limits.  Visualized tracheal air column is within normal limits.  Allowing for portable technique, the lungs are clear.  No pneumothorax or effusion.  IMPRESSION: No acute cardiopulmonary abnormality.   Original Report Authenticated By: Harley Hallmark, M.D.    Ct Cta Abd/pel W/cm &/or W/o Cm  02/20/2012  *RADIOLOGY REPORT*  Clinical Data: Chest pain.  Shortness of breath.  Weakness.  CT ANGIOGRAPHY CHEST  Technique:  Multidetector CT imaging of the chest using the standard protocol during bolus administration of intravenous contrast. Multiplanar reconstructed images including MIPs were obtained and reviewed to evaluate the vascular anatomy.,  Contrast:  100 ml Omnipaque-350  Comparison: None.  Findings: Satisfactory opacification of the pulmonary arteries noted, and  there is no evidence of pulmonary emboli.  An ascending thoracic aortic aneurysm is seen which measures 5.2 cm in maximum diameter.  A Type A thoracic aortic dissection is seen involving both the ascending and descending aorta.  Both true and false lumens are patent.  No evidence of hilar or mediastinal masses.  No adenopathy seen elsewhere within the thorax.  No evidence of pleural or pericardial effusion.   Mild para septal emphysema noted.  No evidence of pulmonary infiltrate or mass.  No central endobronchial lesions identified.  Hepatic steatosis incidentally noted.  IMPRESSION:  1.  No evidence of pulmonary embolism. 2.  5.2 cm ascending thoracic aortic aneurysm, with type A thoracic aortic dissection.  *RADIOLOGY REPORT*  CT ANGIOGRAPHY ABDOMEN AND PELVIS  Technique:  Multidetector CT imaging of the abdomen and pelvis was performed using the standard protocol during bolus administration of intravenous contrast.  Multiplanar reconstructed images including MIPs were obtained and reviewed to evaluate the vascular anatomy.  Contrast:   75 ml Omnipaque-350  Comparison:   None.  Findings:  Aortic dissection extends throughout the abdominal aorta to just above the aortic bifurcation.  There is no evidence of dissection involving the iliac arteries.  An infrarenal abdominal aortic aneurysm is seen which measures 6.3 cm in maximum diameter. No evidence of aneurysm leak or rupture.  Hepatic steatosis noted, but no liver masses are identified. Gallbladder is unremarkable.  The other abdominal parenchymal organs are normal in appearance.  No soft tissue masses or lymphadenopathy identified.  No inflammatory process or abnormal fluid collections are identified within the abdomen or pelvis. Diverticulosis is seen involving the sigmoid colon, however there is no evidence of diverticulitis.   Review of the MIP images confirms the above findings.  IMPRESSION:  1.  Abdominal aortic dissection terminates above the aortic bifurcation.  No evidence of dissection involving the iliac arteries. 2.  6.3 cm infrarenal abdominal aortic aneurysm.  No evidence of aneurysm leak or rupture. 3.  Hepatic steatosis and diverticulosis incidentally noted.  Critical Value/emergent results were called by telephone at the time of interpretation on 02/20/2012 at 0100 hours to Dr. Manus Gunning in the emergency department, who verbally acknowledged these  results.   Original Report Authenticated By: Danae Orleans, M.D.       Recent Lab Findings: Lab Results  Component Value Date   WBC 12.2* 02/20/2012   HGB 14.2 02/20/2012   HCT 40.3 02/20/2012   PLT 138* 02/20/2012   GLUCOSE 118* 02/19/2012   CHOL 181 02/16/2012   TRIG 162.0* 02/16/2012   HDL 39.50 02/16/2012   LDLDIRECT 114* 03/21/2010   LDLCALC 109* 02/16/2012   ALT 56* 02/16/2012   AST 35 02/16/2012   NA 137 02/19/2012   K 3.6 02/19/2012   CL 98 02/19/2012   CREATININE 0.95 02/19/2012   BUN 17 02/19/2012   CO2 28 02/19/2012   TSH 0.95 02/16/2012   HGBA1C 6.2 06/20/2008      Assessment / Plan:      Patient examined situation reviewed. Diagnosis of type A dissection explained to patient including extremely high mortality if not treated as a surgical emergency. Diagnosis of abdominal aortic aneurysm also discussed the patient including the fact that this is not a immediate threat. I fully discussed the indications and details of surgery to repair a type A dissection including cardio point bypass and hypothermic circulatory arrest. I discussed the potential risks of this operation including risks of stroke, bleeding, blood transfusion requirement, renal failure, loss of limb,  and death. The patient understands these issues and agrees to proceed with surgery underwent I feel was an informed consent. The patient is by record legally separated and has requested that his 2 daughters not be notified of the impending surgery which has a 20% risk of major morbidity-mortality. His friend and neighbor Dewayne Hatch is with the patient and by the patient's permission was present during the discussions of the diagnosis and for surgical therapy recommended.

## 2012-02-20 NOTE — ED Notes (Signed)
Can call [facility] at [phone] if needed

## 2012-02-20 NOTE — Anesthesia Procedure Notes (Signed)
Procedure Name: Intubation Date/Time: 02/20/2012 4:22 AM Performed by: Wray Kearns A Pre-anesthesia Checklist: Patient identified, Timeout performed, Emergency Drugs available, Suction available and Patient being monitored Patient Re-evaluated:Patient Re-evaluated prior to inductionOxygen Delivery Method: Circle system utilized Preoxygenation: Pre-oxygenation with 100% oxygen Intubation Type: IV induction and Cricoid Pressure applied Ventilation: Mask ventilation without difficulty and Oral airway inserted - appropriate to patient size Laryngoscope Size: Mac and 4 Grade View: Grade II Tube type: Oral Tube size: 7.5 mm Number of attempts: 1 Airway Equipment and Method: Stylet Placement Confirmation: ETT inserted through vocal cords under direct vision,  breath sounds checked- equal and bilateral and positive ETCO2 Secured at: 25 cm Tube secured with: Tape Dental Injury: Teeth and Oropharynx as per pre-operative assessment

## 2012-02-20 NOTE — Transfer of Care (Signed)
Immediate Anesthesia Transfer of Care Note  Patient: [name]  Procedure(s) Performed: Procedure(s) (LRB) with comments: THORACIC ASCENDING ANEURYSM REPAIR (AAA) (N/A)  Patient Location: ICU  Anesthesia Type: General  Level of Consciousness: sedated and Patient remains intubated per anesthesia plan  Airway & Oxygen Therapy: Patient remains intubated per anesthesia plan and Patient placed on Ventilator (see vital sign flow sheet for setting)  Post-op Assessment: Report given to PACU RN and Post -op Vital signs reviewed and stable  Post vital signs: Reviewed and stable  Complications: No apparent anesthesia complications

## 2012-02-20 NOTE — Anesthesia Preprocedure Evaluation (Signed)
Anesthesia Evaluation  Patient identified by MRN, date of birth, ID band Patient awake    Reviewed: Allergy & Precautions, H&P , NPO status , Patient's Chart, lab work & pertinent test results  Airway Mallampati: I TM Distance: >3 FB Neck ROM: Full    Dental   Pulmonary asthma , sleep apnea , COPDCurrent Smoker,  + rhonchi         Cardiovascular Rhythm:Regular Rate:Tachycardia  Ascending thoracic aortic aneurysm   Neuro/Psych  Headaches,    GI/Hepatic   Endo/Other    Renal/GU      Musculoskeletal   Abdominal   Peds  Hematology   Anesthesia Other Findings   Reproductive/Obstetrics                           Anesthesia Physical Anesthesia Plan  ASA: III and Emergent  Anesthesia Plan: General   Post-op Pain Management:    Induction: Intravenous  Airway Management Planned: Oral ETT  Additional Equipment: Arterial line, PA Cath and TEE  Intra-op Plan:   Post-operative Plan: Post-operative intubation/ventilation  Informed Consent: I have reviewed the patients History and Physical, chart, labs and discussed the procedure including the risks, benefits and alternatives for the proposed anesthesia with the patient or authorized representative who has indicated his/her understanding and acceptance.     Plan Discussed with: CRNA and Surgeon  Anesthesia Plan Comments:         Anesthesia Quick Evaluation

## 2012-02-20 NOTE — ED Notes (Signed)
Patient transported to CT 

## 2012-02-20 NOTE — Progress Notes (Signed)
SICU p.m. Rounds  Status post repair of type a ascending dissection with Heaney shield graft used to replace ascending aorta and hemi-arch reconstruction, resuspension of aortic valve  Intraoperative coagulopathy improved with blood products. Currently hemodynamically stable sinus rhythm, no AI by TEE, good LV function  Plan on leaving patient sedated intubated until a.m. rounds

## 2012-02-20 NOTE — ED Notes (Signed)
Dr. Alla German MD at bedside.

## 2012-02-20 NOTE — Brief Op Note (Signed)
02/19/2012 - 02/20/2012  10:16 AM  PATIENT:  [name]  61 y.o. male  PRE-OPERATIVE DIAGNOSIS:  Type A thoracic aortic dissection  POST-OPERATIVE DIAGNOSIS:   Type A thoracic aortic dissection  PROCEDURE:   EMERGENCY REPAIR OF THORACIC AORTIC DISSECTION (resection and grafting of thoracic aorta with 30 mm Hemashield graft using hypothermic circulatory arrest and retrograde cerebral perfusion)  SURGEON:  Surgeon(s): Kerin Perna, MD  ASSISTANT: Coral Ceo, PA-C  ANESTHESIA:   general  PATIENT CONDITION:  ICU - intubated and hemodynamically stable.  PRE-OPERATIVE WEIGHT: 110 kg

## 2012-02-21 ENCOUNTER — Encounter (HOSPITAL_COMMUNITY): Payer: Self-pay | Admitting: Cardiothoracic Surgery

## 2012-02-21 ENCOUNTER — Inpatient Hospital Stay (HOSPITAL_COMMUNITY): Payer: 59

## 2012-02-21 LAB — PREPARE CRYOPRECIPITATE
Unit division: 0
Unit division: 0
Unit division: 0

## 2012-02-21 LAB — GLUCOSE, CAPILLARY
Glucose-Capillary: 103 mg/dL — ABNORMAL HIGH (ref 70–99)
Glucose-Capillary: 106 mg/dL — ABNORMAL HIGH (ref 70–99)
Glucose-Capillary: 107 mg/dL — ABNORMAL HIGH (ref 70–99)
Glucose-Capillary: 110 mg/dL — ABNORMAL HIGH (ref 70–99)
Glucose-Capillary: 113 mg/dL — ABNORMAL HIGH (ref 70–99)
Glucose-Capillary: 116 mg/dL — ABNORMAL HIGH (ref 70–99)
Glucose-Capillary: 117 mg/dL — ABNORMAL HIGH (ref 70–99)
Glucose-Capillary: 119 mg/dL — ABNORMAL HIGH (ref 70–99)
Glucose-Capillary: 120 mg/dL — ABNORMAL HIGH (ref 70–99)
Glucose-Capillary: 134 mg/dL — ABNORMAL HIGH (ref 70–99)
Glucose-Capillary: 145 mg/dL — ABNORMAL HIGH (ref 70–99)
Glucose-Capillary: 127 mg/dL — ABNORMAL HIGH (ref 70–99)
Glucose-Capillary: 144 mg/dL — ABNORMAL HIGH (ref 70–99)
Glucose-Capillary: 125 mg/dL — ABNORMAL HIGH (ref 70–99)
Glucose-Capillary: 140 mg/dL — ABNORMAL HIGH (ref 70–99)
Glucose-Capillary: 152 mg/dL — ABNORMAL HIGH (ref 70–99)
Glucose-Capillary: 142 mg/dL — ABNORMAL HIGH (ref 70–99)

## 2012-02-21 LAB — PREPARE PLATELET PHERESIS
Unit division: 0
Unit division: 0
Unit division: 0

## 2012-02-21 LAB — POCT I-STAT, CHEM 8
BUN: 21 mg/dL (ref 6–23)
Calcium, Ion: 1.11 mmol/L — ABNORMAL LOW (ref 1.12–1.23)
Chloride: 105 mEq/L (ref 96–112)
Creatinine, Ser: 0.8 mg/dL (ref 0.50–1.35)
Glucose, Bld: 163 mg/dL — ABNORMAL HIGH (ref 70–99)
HCT: 26 % — ABNORMAL LOW (ref 39.0–52.0)
Hemoglobin: 8.8 g/dL — ABNORMAL LOW (ref 13.0–17.0)
Potassium: 3.9 mEq/L (ref 3.5–5.1)
Sodium: 141 mEq/L (ref 135–145)
TCO2: 22 mmol/L (ref 0–100)

## 2012-02-21 LAB — POCT I-STAT 3, ART BLOOD GAS (G3+)
Acid-Base Excess: 1 mmol/L (ref 0.0–2.0)
Acid-base deficit: 1 mmol/L (ref 0.0–2.0)
Bicarbonate: 25.1 mEq/L — ABNORMAL HIGH (ref 20.0–24.0)
Bicarbonate: 23.8 mEq/L (ref 20.0–24.0)
O2 Saturation: 93 %
O2 Saturation: 88 %
Patient temperature: 37.5
Patient temperature: 98.1
TCO2: 26 mmol/L (ref 0–100)
TCO2: 25 mmol/L (ref 0–100)
pCO2 arterial: 36.5 mmHg (ref 35.0–45.0)
pCO2 arterial: 36.6 mmHg (ref 35.0–45.0)
pH, Arterial: 7.447 (ref 7.350–7.450)
pH, Arterial: 7.42 (ref 7.350–7.450)
pO2, Arterial: 67 mmHg — ABNORMAL LOW (ref 80.0–100.0)
pO2, Arterial: 52 mmHg — ABNORMAL LOW (ref 80.0–100.0)

## 2012-02-21 LAB — MAGNESIUM: Magnesium: 2.2 mg/dL (ref 1.5–2.5)

## 2012-02-21 LAB — CBC
HCT: 21.3 % — ABNORMAL LOW (ref 39.0–52.0)
HCT: 24.4 % — ABNORMAL LOW (ref 39.0–52.0)
Hemoglobin: 7.4 g/dL — ABNORMAL LOW (ref 13.0–17.0)
Hemoglobin: 8.6 g/dL — ABNORMAL LOW (ref 13.0–17.0)
MCHC: 34.7 g/dL (ref 30.0–36.0)
MCHC: 35.2 g/dL (ref 30.0–36.0)
RBC: 2.31 MIL/uL — ABNORMAL LOW (ref 4.22–5.81)
WBC: 7.5 10*3/uL (ref 4.0–10.5)

## 2012-02-21 LAB — HEMOGLOBIN A1C
Hgb A1c MFr Bld: 7.5 % — ABNORMAL HIGH (ref <5.7)
Mean Plasma Glucose: 169 mg/dL — ABNORMAL HIGH (ref <117)

## 2012-02-21 LAB — BASIC METABOLIC PANEL
BUN: 19 mg/dL (ref 6–23)
CO2: 26 mEq/L (ref 19–32)
GFR calc non Af Amer: >90 mL/min (ref >90)
Glucose, Bld: 138 mg/dL — ABNORMAL HIGH (ref 70–99)
Potassium: 4.3 mEq/L (ref 3.5–5.1)

## 2012-02-21 LAB — PREPARE FRESH FROZEN PLASMA
Unit division: 0
Unit division: 0

## 2012-02-21 LAB — CREATININE, SERUM
Creatinine, Ser: 0.82 mg/dL (ref 0.50–1.35)
GFR calc Af Amer: >90 mL/min (ref >90)
GFR calc non Af Amer: >90 mL/min (ref >90)

## 2012-02-21 MED ORDER — INSULIN ASPART 100 UNIT/ML ~~LOC~~ SOLN
0.0000 [IU] | SUBCUTANEOUS | Status: DC
  Administered 2012-02-21 – 2012-02-22 (×9): 2 [IU] via SUBCUTANEOUS
  Administered 2012-02-23: 1 [IU] via SUBCUTANEOUS
  Administered 2012-02-23 (×3): 2 [IU] via SUBCUTANEOUS
  Administered 2012-02-24: 4 [IU] via SUBCUTANEOUS
  Administered 2012-02-24: 2 [IU] via SUBCUTANEOUS

## 2012-02-21 MED ORDER — POTASSIUM CHLORIDE 10 MEQ/50ML IV SOLN
10.0000 meq | INTRAVENOUS | Status: AC
  Administered 2012-02-21 (×2): 10 meq via INTRAVENOUS
  Filled 2012-02-21: qty 100

## 2012-02-21 MED ORDER — FUROSEMIDE 10 MG/ML IJ SOLN
20.0000 mg | Freq: Four times a day (QID) | INTRAMUSCULAR | Status: DC
  Administered 2012-02-21: 20 mg via INTRAVENOUS
  Filled 2012-02-21 (×2): qty 2

## 2012-02-21 MED ORDER — FUROSEMIDE 10 MG/ML IJ SOLN
20.0000 mg | Freq: Four times a day (QID) | INTRAMUSCULAR | Status: DC
  Administered 2012-02-21 – 2012-02-22 (×3): 20 mg via INTRAVENOUS
  Filled 2012-02-21 (×4): qty 2

## 2012-02-21 MED ORDER — INSULIN GLARGINE 100 UNIT/ML ~~LOC~~ SOLN
10.0000 [IU] | Freq: Two times a day (BID) | SUBCUTANEOUS | Status: DC
  Administered 2012-02-21 – 2012-02-22 (×3): 10 [IU] via SUBCUTANEOUS

## 2012-02-21 MED ORDER — PRO-STAT SUGAR FREE PO LIQD
30.0000 mL | Freq: Three times a day (TID) | ORAL | Status: DC
  Administered 2012-02-22 – 2012-02-26 (×15): 30 mL via ORAL
  Filled 2012-02-21 (×23): qty 30

## 2012-02-21 MED ORDER — BOOST / RESOURCE BREEZE PO LIQD
1.0000 | Freq: Three times a day (TID) | ORAL | Status: DC
  Administered 2012-02-23 – 2012-02-26 (×11): 1 via ORAL
  Filled 2012-02-21 (×2): qty 1

## 2012-02-21 MED ORDER — NICOTINE 21 MG/24HR TD PT24
21.0000 mg | MEDICATED_PATCH | Freq: Every day | TRANSDERMAL | Status: DC
  Filled 2012-02-21 (×9): qty 1

## 2012-02-21 MED FILL — Potassium Chloride Inj 2 mEq/ML: INTRAVENOUS | Qty: 40 | Status: AC

## 2012-02-21 MED FILL — Coagulation Factor VIIa (Recomb) For Inj 1 MG (1000 MCG): INTRAVENOUS | Qty: 5 | Status: AC

## 2012-02-21 MED FILL — Magnesium Sulfate Inj 50%: INTRAMUSCULAR | Qty: 2 | Status: AC

## 2012-02-21 NOTE — Progress Notes (Signed)
Patient ID: [name], male   DOB: Nov 04, 1950, 61 y.o.   MRN: [id number]  TCTS evening rounds:  Hemodynamically stable  sats 95% on FM  Urine output good  CT output low  CBC    Component Value Date/Time   WBC 7.5 02/21/2012 1635   RBC 2.66* 02/21/2012 1635   HGB 8.6* 02/21/2012 1635   HCT 24.4* 02/21/2012 1635   PLT 31* 02/21/2012 1635   MCV 91.7 02/21/2012 1635   MCH 32.3 02/21/2012 1635   MCHC 35.2 02/21/2012 1635   RDW 15.2 02/21/2012 1635   LYMPHSABS 3.1 02/20/2012 0104   MONOABS 1.1* 02/20/2012 0104   EOSABS 0.2 02/20/2012 0104   BASOSABS 0.0 02/20/2012 0104    Transfusing platelets.  BMET    Component Value Date/Time   NA 141 02/21/2012 1630   K 3.9 02/21/2012 1630   CL 105 02/21/2012 1630   CO2 26 02/21/2012 0351   GLUCOSE 163* 02/21/2012 1630   BUN 21 02/21/2012 1630   CREATININE 0.82 02/21/2012 1635   CALCIUM 7.1* 02/21/2012 0351   GFRNONAA >90 02/21/2012 1635   GFRAA >90 02/21/2012 1635

## 2012-02-21 NOTE — Procedures (Signed)
Extubation Procedure Note  Patient Details:   Name: [name] DOB: 01/06/1951 MRN: [id number]   Airway Documentation:  Airway 7.5 mm (Active)   Patient weaned and extubated @ 0855.NIF 40cm and VC 2076cc's.  Evaluation  O2 sats: stable throughout Complications: No apparent complications Patient did tolerate procedure well. Bilateral Breath Sounds: Clear;Diminished Suctioning: Airway Yes  Clearance Coots 02/21/2012, 9:03 AM

## 2012-02-21 NOTE — Progress Notes (Signed)
Core temp incorrect.  Verified oral temp as well as foley temp.  Malfunction with cord/monitor.

## 2012-02-21 NOTE — Progress Notes (Addendum)
INITIAL ADULT NUTRITION ASSESSMENT Date: 02/21/2012   Time: 12:02 PM  INTERVENTION:  Resource Breeze supplement 3 times daily between meals (250 kcals, 9 gm protein per 8 fl oz carton)  Prostat liquid protein 30 ml 3 times daily with meals (100 kcals, 15 gm protein per dose) RD to follow for nutrition care plan  Reason for Assessment: Low Braden  ASSESSMENT: Male 61 y.o.  Dx: thoracic dissection  Hx:  Past Medical History  Diagnosis Date  . OBSTRUCTIVE SLEEP APNEA 02/2008 sleep study  . MIGRAINE HEADACHE   . ERECTILE DYSFUNCTION, ORGANIC   . Unspecified vitamin D deficiency   . HYPERLIPIDEMIA   . ASTHMA   . ALLERGIC RHINITIS     Related Meds:     . acetaminophen (TYLENOL) oral liquid 160 mg/5 mL  650 mg Per Tube NOW   Or  . acetaminophen  650 mg Rectal NOW  . acetaminophen  1,000 mg Oral Q6H   Or  . acetaminophen (TYLENOL) oral liquid 160 mg/5 mL  975 mg Per Tube Q6H  . atorvastatin  20 mg Oral q1800  . bisacodyl  10 mg Oral Daily   Or  . bisacodyl  10 mg Rectal Daily  . bisacodyl  5 mg Oral Once  . budesonide  0.5 mg Nebulization BID  . cefUROXime (ZINACEF)  IV  1.5 g Intravenous To OR  . cefUROXime (ZINACEF)  IV  1.5 g Intravenous Q12H  . chlorhexidine  60 mL Topical Once  . coagulation factor VIIa recomb  5 mg Intravenous Once  . desmopressin (DDAVP) IV  0.3 mcg/kg Intravenous Once  . docusate sodium  200 mg Oral Daily  . DOPamine  2-20 mcg/kg/min Intravenous To OR  . famotidine (PEPCID) IV  20 mg Intravenous Q12H  . fluticasone  2 spray Each Nare Daily  . furosemide  20 mg Intravenous Q6H  . insulin aspart  0-24 Units Subcutaneous Q4H  . insulin glargine  10 Units Subcutaneous BID  . insulin regular  0-10 Units Intravenous TID WC  . levalbuterol  1.25 mg Nebulization TID  . magnesium sulfate  4 g Intravenous Once  . metoprolol tartrate  12.5 mg Oral BID   Or  . metoprolol tartrate  12.5 mg Per Tube BID  . metoprolol tartrate  12.5 mg Oral Once  .  montelukast  10 mg Oral Daily  . nicotine  21 mg Transdermal Daily  . pantoprazole  40 mg Oral Q1200  . potassium chloride  10 mEq Intravenous Q1 Hr x 3  . potassium chloride  10 mEq Intravenous Q1 Hr x 3  . potassium chloride  10 mEq Intravenous Q1 Hr x 2  . sodium chloride  3 mL Intravenous Q12H  . vancomycin  1,500 mg Intravenous To OR  . vancomycin  1,000 mg Intravenous Q12H  . DISCONTD: aspirin  324 mg Per Tube Daily  . DISCONTD: aspirin EC  325 mg Oral Daily  . DISCONTD: cefUROXime (ZINACEF)  IV  750 mg Intravenous To OR  . DISCONTD: dexmedetomidine  0.1-0.7 mcg/kg/hr Intravenous To OR  . DISCONTD: epinephrine  0.5-20 mcg/min Intravenous To OR  . DISCONTD: esmolol  25-300 mcg/kg/min Intravenous Once  . DISCONTD: magnesium sulfate  40 mEq Other To OR  . DISCONTD: nitroGLYCERIN  2-200 mcg/min Intravenous To OR  . DISCONTD: phenylephrine (NEO-SYNEPHRINE) Adult infusion  30-200 mcg/min Intravenous To OR  . DISCONTD: potassium chloride  80 mEq Other To OR  . DISCONTD: tranexamic acid  15 mg/kg Intravenous To  OR  . DISCONTD: tranexamic acid  2 mg/kg Intracatheter To OR  . DISCONTD: tranexamic acid (CYKLOKAPRON) infusion ([facility])  1.5 mg/kg/hr Intravenous To OR  . DISCONTD: vancomycin  1,000 mg Intravenous Once    Ht: 6\' 4"  (193 cm)  Wt: 267 lb 10.2 oz (121.4 kg)  Ideal Wt: 91.8 kg % Ideal Wt: 131%  Usual Wt: 242 lb -- per office visit records % Usual Wt: 110%  Body mass index is 32.58 kg/(m^2).  Food/Nutrition Related Hx: admission nutrition screen incomplete  Labs:  CMP     Component Value Date/Time   NA 139 02/21/2012 0351   K 4.3 02/21/2012 0351   CL 108 02/21/2012 0351   CO2 26 02/21/2012 0351   GLUCOSE 138* 02/21/2012 0351   BUN 19 02/21/2012 0351   CREATININE 0.88 02/21/2012 0351   CALCIUM 7.1* 02/21/2012 0351   PROT 7.3 02/16/2012 1520   ALBUMIN 4.2 02/16/2012 1520   AST 35 02/16/2012 1520   ALT 56* 02/16/2012 1520   ALKPHOS 34* 02/16/2012 1520   BILITOT 1.0 02/16/2012 1520    GFRNONAA >90 02/21/2012 0351   GFRAA >90 02/21/2012 0351     Intake/Output Summary (Last 24 hours) at 02/21/12 1205 Last data filed at 02/21/12 1200  Gross per 24 hour  Intake 10333.27 ml  Output   3760 ml  Net 6573.27 ml    CBG (last 3)   Basename 02/21/12 1143 02/21/12 0723 02/21/12 0605  GLUCAP 140* 144* 125*    Diet Order: Clear Liquid  Supplements/Tube Feeding: N/A  IVF:    sodium chloride Last Rate: 20 mL/hr at 02/20/12 1639  sodium chloride   sodium chloride   DOPamine Last Rate: 3 mcg/kg/min (02/21/12 1100)  insulin (NOVOLIN-R) infusion Last Rate: Stopped (02/21/12 1100)  phenylephrine (NEO-SYNEPHRINE) Adult infusion Last Rate: 15 mcg/min (02/21/12 1100)  DISCONTD: sodium chloride Last Rate: 100 mL/hr at 02/19/12 2108  DISCONTD: dexmedetomidine Last Rate: 0.3 mcg/kg/hr (02/21/12 0800)  DISCONTD: lactated ringers Last Rate: 20 mL/hr at 02/21/12 0000  DISCONTD: milrinone Last Rate: 0.2 mcg/kg/min (02/21/12 0800)  DISCONTD: nitroGLYCERIN     Estimated Nutritional Needs:   Kcal: 2200-2400 Protein: 120-130 gm Fluid: 2.2-2.4 L  RD unable to obtain nutrition hx at this time; arrived to ED with complaint of chest pain and headache; code stroke initiated; s/p emergent repair of thoracic aortic dissection 9/8; extubated this AM; low braden score places patient at risk for skin breakdown; currently on a Clear Liquid diet; would benefit from addition of supplements -- RD to order.  NUTRITION DIAGNOSIS: -Inadequate oral intake (NI-2.1).  Status: Ongoing  RELATED TO: s/p extubation, decreased alertness  AS EVIDENCE BY: no % meal intake recorded  MONITORING/EVALUATION(Goals): Goal: Oral intake with meals & supplements to meet >/= 90% of estimated nutrition needs Monitor: PO & supplemental intake, weight, labs, I/O's  EDUCATION NEEDS: -No education needs identified at this time  Dietitian #: [phone]  DOCUMENTATION CODES Per approved criteria  -Obesity Unspecified      Alger Memos 02/21/2012, 12:02 PM

## 2012-02-21 NOTE — Progress Notes (Addendum)
1 Day Post-Op Procedure(s) (LRB): THORACIC ASCENDING ANEURYSM REPAIR (AAA) (N/A)  Subjective: Patient is intubated and sedated.  Objective: Vital signs in last 24 hours: Patient Vitals for the past 24 hrs:  BP Temp Temp src Pulse Resp SpO2 Height Weight  02/21/12 0715 - 99.3 F (37.4 C) - 83  20  99 % - -  02/21/12 0700 - - - 84  20  99 % - -  02/21/12 0645 - - - 83  22  99 % - -  02/21/12 0630 - - - 85  20  99 % - -  02/21/12 0615 - 99.7 F (37.6 C) - 86  18  99 % - -  02/21/12 0600 - - - 87  22  98 % - -  02/21/12 0545 135/69 mmHg - - 88  20  98 % - -  02/21/12 0530 - - - 83  18  99 % - -  02/21/12 0515 - - - 85  18  100 % - -  02/21/12 0500 - - - 87  20  100 % - 267 lb 10.2 oz (121.4 kg)  02/21/12 0453 - - - 87  17  100 % - -  02/21/12 0445 - - - 87  19  100 % - -  02/21/12 0430 - - - 87  19  99 % - -  02/21/12 0415 - - - 88  20  99 % - -  02/21/12 0400 - 98.5 F (36.9 C) Oral 88  20  100 % - -  02/21/12 0345 - - - 89  19  99 % - -  02/21/12 0330 - - - 89  17  100 % - -  02/21/12 0315 - - - 90  20  99 % - -  02/21/12 0300 - 102.7 F (39.3 C) - 88  17  99 % - -  02/21/12 0245 - 100.2 F (37.9 C) - 88  19  100 % - -  02/21/12 0230 - 100 F (37.8 C) - 90  19  99 % - -  02/21/12 0215 - 100.2 F (37.9 C) - 87  4  100 % - -  02/21/12 0200 - 100.2 F (37.9 C) - 89  3  100 % - -  02/21/12 0145 - 100.2 F (37.9 C) - 90  14  99 % - -  02/21/12 0130 - 100.2 F (37.9 C) - 90  17  100 % - -  02/21/12 0115 - 100.2 F (37.9 C) - 91  19  100 % - -  02/21/12 0100 - 100.2 F (37.9 C) - 92  20  100 % - -  02/21/12 0045 - 100.2 F (37.9 C) - 93  18  100 % - -  02/21/12 0030 - 100.2 F (37.9 C) - 94  18  100 % - -  02/21/12 0015 - 100.2 F (37.9 C) - 94  20  100 % - -  02/21/12 0006 - 100.2 F (37.9 C) - 93  20  100 % - -  02/21/12 0000 - 100.4 F (38 C) - 95  19  100 % - -  02/20/12 2345 - 100.6 F (38.1 C) - 95  19  100 % - -  02/20/12 2330 - 100.4 F (38 C) - 95  19   100 % - -  02/20/12 2315 - 100.6 F (38.1 C) - 96  19  100 % - -  02/20/12 2304 99/44 mmHg 100.4 F (38 C) - 96  8  100 % - -  02/20/12 2300 - 100.4 F (38 C) - 96  20  100 % - -  02/20/12 2245 - 100.4 F (38 C) - 97  19  100 % - -  02/20/12 2230 - 100.2 F (37.9 C) - 98  24  96 % - -  02/20/12 2215 85/40 mmHg 100.2 F (37.9 C) - 99  18  100 % - -  02/20/12 2200 - 100.2 F (37.9 C) - 98  18  100 % - -  02/20/12 2145 - 100 F (37.8 C) - 98  20  100 % - -  02/20/12 2130 - 100 F (37.8 C) - 99  20  100 % - -  02/20/12 2115 - 99.9 F (37.7 C) - 99  19  100 % - -  02/20/12 2100 - 99.7 F (37.6 C) - 99  20  100 % - -  02/20/12 2045 - 99.7 F (37.6 C) - 100  18  100 % - -  02/20/12 2030 - 99.7 F (37.6 C) - 99  21  100 % - -  02/20/12 2015 - 99.5 F (37.5 C) - 100  21  100 % - -  02/20/12 2005 - 99.5 F (37.5 C) - 101  19  100 % - -  02/20/12 2000 - 99.5 F (37.5 C) - 100  20  100 % - -  02/20/12 1953 - - - - - 99 % - -  02/20/12 1945 - 99.3 F (37.4 C) - 101  19  100 % - -  02/20/12 1930 - 99.3 F (37.4 C) - 101  18  100 % - -  02/20/12 1915 - 99.1 F (37.3 C) - 103  21  100 % - -  02/20/12 1900 - 99 F (37.2 C) - 102  17  100 % - -  02/20/12 1845 - 98.8 F (37.1 C) - 102  21  100 % - -  02/20/12 1830 - 98.6 F (37 C) - 103  21  100 % - -  02/20/12 1815 - 98.6 F (37 C) - 104  19  100 % - -  02/20/12 1800 - 98.4 F (36.9 C) - 106  24  99 % - -  02/20/12 1745 - 98.2 F (36.8 C) - 104  23  100 % - -  02/20/12 1730 - 98.1 F (36.7 C) - 103  21  100 % - -  02/20/12 1715 - 97.9 F (36.6 C) - 109  20  98 % - -  02/20/12 1700 - 97.7 F (36.5 C) - 110  22  99 % - -  02/20/12 1645 - 97.5 F (36.4 C) - 110  22  99 % - -  02/20/12 1630 - 97.5 F (36.4 C) - 99  20  100 % - -  02/20/12 1628 - - - - - 100 % 6\' 4"  (1.93 m) 242 lb (109.77 kg)  02/20/12 1615 - 97.3 F (36.3 C) - 104  19  100 % - -  02/20/12 1600 90/44 mmHg 97.2 F (36.2 C) - 105  18  100 % - -  02/20/12  1545 89/44 mmHg - - 106  12  100 % - -   Pre op weight  109.8 kg Current Weight  02/21/12 267 lb 10.2 oz (121.4 kg)    Hemodynamic parameters for last  24 hours: PAP: (20-44)/(14-29) 25/16 mmHg CO:  [4.4 L/min-6.7 L/min] 6.7 L/min CI:  [1.8 L/min/m2-2.8 L/min/m2] 2.8 L/min/m2  Intake/Output from previous day: 09/08 0701 - 09/09 0700 In: 9435.8 [I.V.:4359.8; KGMWN:0272; NG/GT:60; IV Piggyback:2150] Out: 4255 [Urine:3505; Emesis/NG output:350; Chest Tube:400]   Physical Exam:  Cardiovascular: RRR Pulmonary: Diminished at bases bilaterally; no rales, wheezes, or rhonchi. Abdomen: Soft, non tender, occ bowel sounds present. Extremities: Mild bilateral lower extremity edema. Wound: Clean and dry.  No erythema or signs of infection.  Lab Results: CBC: Basename 02/21/12 0351 02/20/12 2201 02/20/12 2155  WBC 6.9 -- 6.5  HGB 7.4* 7.5* --  HCT 21.3* 22.0* --  PLT 37* -- 46*   BMET:  Basename 02/21/12 0351 02/20/12 2201 02/19/12 1950  NA 139 143 --  K 4.3 3.7 --  CL 108 104 --  CO2 26 -- 28  GLUCOSE 138* 147* --  BUN 19 16 --  CREATININE 0.88 0.90 --  CALCIUM 7.1* -- 9.5    PT/INR:  Lab Results  Component Value Date   INR 1.50* 02/20/2012   ABG:  INR: Will add last result for INR, ABG once components are confirmed Will add last 4 CBG results once components are confirmed  Assessment/Plan:  1. CV - SR. On Milrinone, Dopamine, and Neo syneprhine gttps. Wean as tolerates.On Lopressor 12.5 bid. 2.  Pulmonary - Chest tubes with 400 cc of output.Chest tubes to remain to suction.CXR this am shows patient is rotated to the right, bibasilar atelectasis, and some pulmonary vascular congestion.   3. Volume Overload - Begin diuresis. 4.  Acute blood loss anemia - H and H 7.4 and 21.3.Consider transfusion. 5.Thrombocytopenia-platelets slightly decreased this am 37,000. Will likely hold ecasa this am.May need to check HIT. 6.CBGs 116/113/127.No previous history of DM. Await  HGA1C  ZIMMERMAN,DONIELLE MPA-C 02/21/2012 Severe coagulopathy post-op  Good pulses Fluid overload start lasix Wean vent 1 unit PRBC,s

## 2012-02-21 NOTE — Care Management Note (Signed)
    Page 1 of 1   02/29/2012     2:21:10 PM   CARE MANAGEMENT NOTE 02/29/2012  Patient:  [name], [name]   Account Number:  [id number]  Date Initiated:  02/21/2012  Documentation initiated by:  Jlon Betker  Subjective/Objective Assessment:   PT S/P EMERGENT TAA REPAIR ON 02/19/12.  PTA, PT INDEPENDENT, LIVES ALONE.  HE HAS 2 SUPPORTIVE DAUGHTERS AT BEDSIDE.     Action/Plan:   MET WITH PT AND DAUGHTERS TO DISCUSS DC PLANS.  DAUGHTERS STATE THAT THEY AND OTHER FAMILY(WHO ARE FLYING IN FROM OUT OF TOWN) WILL BE PROVIDING CARE AT DISCHARGE.  WILL FOLLOW FOR HOME NEEDS AS PT PROGRESSES.   Anticipated DC Date:  02/29/2012   Anticipated DC Plan:  IP REHAB FACILITY      DC Planning Services  CM consult      Choice offered to / List presented to:             Status of service:  Completed, signed off Medicare Important Message given?   (If response is "NO", the following Medicare IM given date fields will be blank) Date Medicare IM given:   Date Additional Medicare IM given:    Discharge Disposition:  IP REHAB FACILITY  Per UR Regulation:  Reviewed for med. necessity/level of care/duration of stay  If discussed at Long Length of Stay Meetings, dates discussed:    Comments:  02/29/12 Kripa Foskey,RN,BSN 1000 PT MEDICALLY STABLE TODAY FOR DISCHARGE TO INPATIENT REHAB AT [facility].

## 2012-02-21 NOTE — Anesthesia Postprocedure Evaluation (Signed)
  Anesthesia Post-op Note  Patient: [name]  Procedure(s) Performed: Procedure(s) (LRB) with comments: THORACIC ASCENDING ANEURYSM REPAIR (AAA) (N/A)  Patient Location: SICU  Anesthesia Type: General  Level of Consciousness: awake and alert   Airway and Oxygen Therapy: Patient Spontanous Breathing and Patient connected to nasal cannula oxygen  Post-op Pain: mild  Post-op Assessment: Post-op Vital signs reviewed, Patient's Cardiovascular Status Stable, Respiratory Function Stable, Patent Airway and No signs of Nausea or vomiting  Post-op Vital Signs: Reviewed and stable  Complications: No apparent anesthesia complications

## 2012-02-22 ENCOUNTER — Inpatient Hospital Stay (HOSPITAL_COMMUNITY): Payer: 59

## 2012-02-22 ENCOUNTER — Encounter (HOSPITAL_COMMUNITY): Payer: Self-pay

## 2012-02-22 LAB — MRSA PCR SCREENING: MRSA by PCR: NEGATIVE

## 2012-02-22 LAB — POCT I-STAT 3, ART BLOOD GAS (G3+)
Bicarbonate: 24.5 mEq/L — ABNORMAL HIGH (ref 20.0–24.0)
O2 Saturation: 86 %
Patient temperature: 98.1
TCO2: 26 mmol/L (ref 0–100)
pCO2 arterial: 35.9 mmHg (ref 35.0–45.0)
pH, Arterial: 7.442 (ref 7.350–7.450)
pO2, Arterial: 48 mmHg — ABNORMAL LOW (ref 80.0–100.0)

## 2012-02-22 LAB — CBC
HCT: 23.8 % — ABNORMAL LOW (ref 39.0–52.0)
HCT: 25.3 % — ABNORMAL LOW (ref 39.0–52.0)
Hemoglobin: 8.4 g/dL — ABNORMAL LOW (ref 13.0–17.0)
Hemoglobin: 8.6 g/dL — ABNORMAL LOW (ref 13.0–17.0)
MCH: 32.3 pg (ref 26.0–34.0)
MCH: 31 pg (ref 26.0–34.0)
MCHC: 35.3 g/dL (ref 30.0–36.0)
MCHC: 34 g/dL (ref 30.0–36.0)
MCV: 91.5 fL (ref 78.0–100.0)
MCV: 91.3 fL (ref 78.0–100.0)
Platelets: 57 10*3/uL — ABNORMAL LOW (ref 150–400)
Platelets: 45 10*3/uL — ABNORMAL LOW (ref 150–400)
RBC: 2.6 MIL/uL — ABNORMAL LOW (ref 4.22–5.81)
RBC: 2.77 MIL/uL — ABNORMAL LOW (ref 4.22–5.81)
RDW: 15.2 % (ref 11.5–15.5)
RDW: 14.8 % (ref 11.5–15.5)
WBC: 7.8 10*3/uL (ref 4.0–10.5)
WBC: 7.5 10*3/uL (ref 4.0–10.5)

## 2012-02-22 LAB — BASIC METABOLIC PANEL
BUN: 24 mg/dL — ABNORMAL HIGH (ref 6–23)
BUN: 27 mg/dL — ABNORMAL HIGH (ref 6–23)
CO2: 27 mEq/L (ref 19–32)
CO2: 24 mEq/L (ref 19–32)
Calcium: 7.6 mg/dL — ABNORMAL LOW (ref 8.4–10.5)
Calcium: 7.6 mg/dL — ABNORMAL LOW (ref 8.4–10.5)
Chloride: 107 mEq/L (ref 96–112)
Chloride: 103 mEq/L (ref 96–112)
Creatinine, Ser: 0.74 mg/dL (ref 0.50–1.35)
Creatinine, Ser: 0.73 mg/dL (ref 0.50–1.35)
GFR calc Af Amer: >90 mL/min (ref >90)
GFR calc Af Amer: >90 mL/min (ref >90)
GFR calc non Af Amer: >90 mL/min (ref >90)
GFR calc non Af Amer: >90 mL/min (ref >90)
Glucose, Bld: 140 mg/dL — ABNORMAL HIGH (ref 70–99)
Glucose, Bld: 122 mg/dL — ABNORMAL HIGH (ref 70–99)
Potassium: 3.7 mEq/L (ref 3.5–5.1)
Potassium: 3.9 mEq/L (ref 3.5–5.1)
Sodium: 140 mEq/L (ref 135–145)
Sodium: 137 mEq/L (ref 135–145)

## 2012-02-22 LAB — GLUCOSE, CAPILLARY
Glucose-Capillary: 116 mg/dL — ABNORMAL HIGH (ref 70–99)
Glucose-Capillary: 129 mg/dL — ABNORMAL HIGH (ref 70–99)
Glucose-Capillary: 130 mg/dL — ABNORMAL HIGH (ref 70–99)
Glucose-Capillary: 133 mg/dL — ABNORMAL HIGH (ref 70–99)
Glucose-Capillary: 135 mg/dL — ABNORMAL HIGH (ref 70–99)
Glucose-Capillary: 142 mg/dL — ABNORMAL HIGH (ref 70–99)

## 2012-02-22 LAB — PREPARE PLATELET PHERESIS: Unit division: 0

## 2012-02-22 MED ORDER — FUROSEMIDE 10 MG/ML IJ SOLN
40.0000 mg | Freq: Two times a day (BID) | INTRAMUSCULAR | Status: DC
  Administered 2012-02-22 – 2012-02-24 (×5): 40 mg via INTRAVENOUS
  Filled 2012-02-22 (×4): qty 4

## 2012-02-22 MED ORDER — FUROSEMIDE 10 MG/ML IJ SOLN: 40.0000 mg | Freq: Once | INTRAMUSCULAR | Status: DC

## 2012-02-22 MED ORDER — FUROSEMIDE 10 MG/ML IJ SOLN
40.0000 mg | Freq: Once | INTRAMUSCULAR | Status: AC
  Administered 2012-02-22: 40 mg via INTRAVENOUS

## 2012-02-22 MED ORDER — POTASSIUM CHLORIDE 10 MEQ/50ML IV SOLN
INTRAVENOUS | Status: AC
  Administered 2012-02-22: 10 meq
  Filled 2012-02-22: qty 50

## 2012-02-22 MED ORDER — POTASSIUM CHLORIDE 10 MEQ/50ML IV SOLN
10.0000 meq | INTRAVENOUS | Status: AC | PRN
  Administered 2012-02-22 (×3): 10 meq via INTRAVENOUS
  Filled 2012-02-22: qty 100

## 2012-02-22 MED FILL — Sodium Bicarbonate IV Soln 8.4%: INTRAVENOUS | Qty: 100 | Status: AC

## 2012-02-22 MED FILL — Heparin Sodium (Porcine) Inj 1000 Unit/ML: INTRAMUSCULAR | Qty: 30 | Status: AC

## 2012-02-22 MED FILL — Sodium Chloride IV Soln 0.9%: INTRAVENOUS | Qty: 1000 | Status: AC

## 2012-02-22 MED FILL — Mannitol IV Soln 20%: INTRAVENOUS | Qty: 500 | Status: AC

## 2012-02-22 MED FILL — Electrolyte-R (PH 7.4) Solution: INTRAVENOUS | Qty: 8000 | Status: AC

## 2012-02-22 MED FILL — Sodium Chloride Irrigation Soln 0.9%: Qty: 6000 | Status: AC

## 2012-02-22 MED FILL — Sodium Bicarbonate IV Soln 8.4%: INTRAVENOUS | Qty: 50 | Status: AC

## 2012-02-22 MED FILL — Heparin Sodium (Porcine) Inj 1000 Unit/ML: INTRAMUSCULAR | Qty: 10 | Status: AC

## 2012-02-22 MED FILL — Lidocaine HCl IV Inj 20 MG/ML: INTRAVENOUS | Qty: 10 | Status: AC

## 2012-02-22 NOTE — Progress Notes (Signed)
When attempting to draw labs, noted that sleeve was unable to draw back blood; would draw back air.  Sleeve clamped. Head lowered to 12 degrees, as low as patient could tolerate.  Dressing removed from neck and stitch removed.  Sterile technique used. Patient instructed to, on the count of 3, take a deep breath in, and hold.  Practiced technique, patient performed successfully.  Proceeded to have the patient repeat technique and sleeve was removed.  Upon removal patient stated "I'm going to pass out."  Monitor alarmed asystole.  Pacer not capturing.  Patient stopped responding.  Pacer promptly set to emergency mode, pacer began capturing, pacer set on DDD at 80.  Patient alert, able to follow commands, oriented x4.  Patient placed on 100% nonrebreather with sats in mid 80s.  Pacer left DDD at 80.  MD Tyrone Sage made aware of event.  Instructed to acquire previously ordered CBC and BMP as well as an ABG.  Will continue to monitor closely.

## 2012-02-22 NOTE — Evaluation (Signed)
Physical Therapy Evaluation Patient Details Name: [name] MRN: [id number] DOB: 03-03-1951 Today's Date: 02/22/2012 Time: 3086-5784 PT Time Calculation (min): 17 min  PT Assessment / Plan / Recommendation Clinical Impression  Pt adm with dissection of TAA.  Underwent emergent repair.  Pt lives alone but has supportive daughters.  Needs skilled PT to maximize I and safety so pt can return home with assist of family.  At this time recommend HHPT assuming he makes good progress.  If progress is slow he may need inpatient rehab.    PT Assessment  Patient needs continued PT services    Follow Up Recommendations  Home health PT (if progress good)    Barriers to Discharge        Equipment Recommendations  Rolling walker with 5" wheels    Recommendations for Other Services OT consult   Frequency Min 3X/week    Precautions / Restrictions Precautions Precautions: Sternal;Fall   Pertinent Vitals/Pain VSS.  Incisional pain but unable to rate.      Mobility  Bed Mobility Bed Mobility: Supine to Sit;Sitting - Scoot to Edge of Bed Supine to Sit: 1: +2 Total assist;HOB elevated Supine to Sit: Patient Percentage: 60% Sitting - Scoot to Edge of Bed: 3: Mod assist Details for Bed Mobility Assistance: Assist to bring trunk up and bring hips to edge of bed using bed pad Transfers Transfers: Sit to Stand;Stand to Sit;Stand Pivot Transfers Sit to Stand: 1: +2 Total assist;From elevated surface;From bed;Without upper extremity assist Sit to Stand: Patient Percentage: 60% Stand to Sit: 1: +2 Total assist;To chair/3-in-1;Without upper extremity assist Stand to Sit: Patient Percentage: 60% Stand Pivot Transfers: 1: +2 Total assist Stand Pivot Transfers: Patient Percentage: 60% Details for Transfer Assistance: Assist to bring hips up and cues to use legs primarily to follow sternal precautions.  Bil hand held assist for stand pivot from bed to recliner.    Exercises     PT Diagnosis:  Difficulty walking;Generalized weakness;Acute pain  PT Problem List: Decreased strength;Decreased activity tolerance;Decreased balance;Decreased mobility;Pain;Decreased knowledge of precautions;Decreased knowledge of use of DME PT Treatment Interventions: DME instruction;Gait training;Stair training;Functional mobility training;Therapeutic activities;Therapeutic exercise;Balance training;Patient/family education   PT Goals Acute Rehab PT Goals PT Goal Formulation: Patient unable to participate in goal setting Time For Goal Achievement: 03/07/12 Potential to Achieve Goals: Good Pt will go Sit to Supine/Side: with supervision PT Goal: Sit to Supine/Side - Progress: Goal set today Pt will go Sit to Stand: with supervision PT Goal: Sit to Stand - Progress: Goal set today Pt will go Stand to Sit: with supervision PT Goal: Stand to Sit - Progress: Goal set today Pt will Ambulate: >150 feet;with supervision;with least restrictive assistive device PT Goal: Ambulate - Progress: Goal set today  Visit Information  Last PT Received On: 02/22/12 Assistance Needed: +2    Subjective Data  Subjective: "Why is it so hard?" pt asked.  Explained to pt that he had major surgery and that it would get better. Patient Stated Goal: Too groggy to state.   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      Extremity/Trunk Assessment Right Lower Extremity Assessment RLE ROM/Strength/Tone: Deficits RLE ROM/Strength/Tone Deficits: grossly 3+/5 with functional activity Left Lower Extremity Assessment LLE ROM/Strength/Tone: Deficits LLE ROM/Strength/Tone Deficits: grossly 3+/5 with functional activity   Balance Static Standing Balance Static Standing - Balance Support: Bilateral upper extremity supported Static Standing - Level of Assistance:  (pt = 70%)  End of Session PT - End of Session Equipment Utilized During Treatment: Oxygen Activity Tolerance: Patient limited by fatigue Patient left: in chair;with call bell/phone within reach Nurse Communication: Mobility status  GP     [facility] 02/22/2012, 2:46 PM  [facility] PT [phone]

## 2012-02-22 NOTE — Progress Notes (Signed)
Pt refuses to wear non-rebreather mask.  Placed on 50% venturi mask.  O2 sat 92%.  Will continue to monitor.

## 2012-02-22 NOTE — Progress Notes (Signed)
Patient ID: [name], male   DOB: [birth year], 61 y.o.   MRN: [id number]                   [address]            [place] [address]          [phone]     2 Days Post-Op Procedure(s) (LRB): THORACIC ASCENDING ANEURYSM REPAIR (AAA) (N/A)  Total Length of Stay:  LOS: 3 days  BP 128/69  Pulse 69  Temp 97.2 F (36.2 C) (Oral)  Resp 15  Ht 6\' 4"  (1.93 m)  Wt 268 lb 11.9 oz (121.9 kg)  BMI 32.71 kg/m2  SpO2 97%     . sodium chloride    . sodium chloride    . DISCONTD: sodium chloride 20 mL/hr at 02/20/12 1639  . DISCONTD: DOPamine 2 mcg/kg/min (02/22/12 0300)  . DISCONTD: insulin (NOVOLIN-R) infusion Stopped (02/21/12 1100)  . DISCONTD: phenylephrine (NEO-SYNEPHRINE) Adult infusion Stopped (02/21/12 1300)     Lab Results  Component Value Date   WBC 7.8 02/22/2012   HGB 8.4* 02/22/2012   HCT 23.8* 02/22/2012   PLT 57* 02/22/2012   GLUCOSE 140* 02/22/2012   CHOL 181 02/16/2012   TRIG 162.0* 02/16/2012   HDL 39.50 02/16/2012   LDLDIRECT 114* 03/21/2010   LDLCALC 109* 02/16/2012   ALT 56* 02/16/2012   AST 35 02/16/2012   NA 140 02/22/2012   K 3.7 02/22/2012   CL 107 02/22/2012   CREATININE 0.74 02/22/2012   BUN 24* 02/22/2012   CO2 27 02/22/2012   TSH 0.95 02/16/2012   PSA 0.47 01/02/2010   INR 1.50* 02/20/2012   HGBA1C 7.5* 02/21/2012   Stable day Awake and alert Up to chair today Leave foley tonight to monitor uop and on lasix   Delight Ovens MD  Beeper [phone] Office [phone] 02/22/2012 5:28 PM

## 2012-02-22 NOTE — Progress Notes (Signed)
Pt continues to remove Venturi mask, sats drop into low 80s.  Pt oriented x4.  Pt educated on necessity of oxygen and states understanding.  Non rebreather mask applied.  Sat 96%.  Will continue to monitor.

## 2012-02-22 NOTE — Progress Notes (Signed)
MD Tyrone Sage made aware of ABG results and patient's need for non-rebreather.  Orders received.  Will continue to monitor.

## 2012-02-22 NOTE — Progress Notes (Signed)
2 Days Post-Op Procedure(s) (LRB): THORACIC ASCENDING ANEURYSM REPAIR (AAA) (N/A) Subjective: Doing well after replacement of ascend aorta and hemiarch Sleepy but neuro intact Good pulses Platelet ct better  Objective: Vital signs in last 24 hours: Temp:  [97.3 F (36.3 C)-99.3 F (37.4 C)] 97.7 F (36.5 C) (09/10 0741) Pulse Rate:  [69-93] 75  (09/10 1000) Cardiac Rhythm:  [-] Normal sinus rhythm (09/10 1000) Resp:  [3-24] 14  (09/10 1000) BP: (109-159)/(58-79) 154/79 mmHg (09/10 1000) SpO2:  [89 %-100 %] 97 % (09/10 1000) Arterial Line BP: (96-114)/(54-59) 102/57 mmHg (09/09 1115) FiO2 (%):  [35 %-50 %] 35 % (09/09 2051) Weight:  [268 lb 11.9 oz (121.9 kg)] 268 lb 11.9 oz (121.9 kg) (09/10 0500)  Hemodynamic parameters for last 24 hours: PAP: (31-38)/(15-23) 35/20 mmHg CO:  [7.3 L/min] 7.3 L/min CI:  [3 L/min/m2] 3 L/min/m2  Intake/Output from previous day: 09/09 0701 - 09/10 0700 In: 3865.7 [P.O.:760; I.V.:1710.7; Blood:635; IV Piggyback:760] Out: 4095 [Urine:3425; Chest Tube:670] Intake/Output this shift: Total I/O In: 244.2 [P.O.:60; I.V.:30.2; IV Piggyback:154] Out: 255 [Urine:255]  CXR  Mild edema NSR  Lab Results:  Basename 02/22/12 0354 02/21/12 1635  WBC 7.8 7.5  HGB 8.4* 8.6*  HCT 23.8* 24.4*  PLT 57* 31*   BMET:  Basename 02/22/12 0354 02/21/12 1635 02/21/12 1630 02/21/12 0351  NA 140 -- 141 --  K 3.7 -- 3.9 --  CL 107 -- 105 --  CO2 27 -- -- 26  GLUCOSE 140* -- 163* --  BUN 24* -- 21 --  CREATININE 0.74 0.82 -- --  CALCIUM 7.6* -- -- 7.1*    PT/INR:  Basename 02/20/12 1600  LABPROT 18.4*  INR 1.50*   ABG    Component Value Date/Time   PHART 7.420 02/21/2012 1351   HCO3 23.8 02/21/2012 1351   TCO2 22 02/21/2012 1630   ACIDBASEDEF 1.0 02/21/2012 1351   O2SAT 88.0 02/21/2012 1351   CBG (last 3)   Basename 02/22/12 0738 02/22/12 0344 02/21/12 2349  GLUCAP 142* 129* 133*    Assessment/Plan: S/P Procedure(s) (LRB): THORACIC ASCENDING  ANEURYSM REPAIR (AAA) (N/A) Cont diuresis Mobilize OOB  Low dose B blocker   LOS: 3 days    VAN TRIGT III,Weltha Cathy 02/22/2012

## 2012-02-23 ENCOUNTER — Inpatient Hospital Stay (HOSPITAL_COMMUNITY): Payer: 59

## 2012-02-23 LAB — GLUCOSE, CAPILLARY
Glucose-Capillary: 113 mg/dL — ABNORMAL HIGH (ref 70–99)
Glucose-Capillary: 120 mg/dL — ABNORMAL HIGH (ref 70–99)
Glucose-Capillary: 122 mg/dL — ABNORMAL HIGH (ref 70–99)
Glucose-Capillary: 124 mg/dL — ABNORMAL HIGH (ref 70–99)
Glucose-Capillary: 128 mg/dL — ABNORMAL HIGH (ref 70–99)
Glucose-Capillary: 155 mg/dL — ABNORMAL HIGH (ref 70–99)

## 2012-02-23 LAB — CBC
HCT: 26.2 % — ABNORMAL LOW (ref 39.0–52.0)
Hemoglobin: 8.9 g/dL — ABNORMAL LOW (ref 13.0–17.0)
MCH: 31.4 pg (ref 26.0–34.0)
MCHC: 34 g/dL (ref 30.0–36.0)
MCV: 92.6 fL (ref 78.0–100.0)
Platelets: 59 10*3/uL — ABNORMAL LOW (ref 150–400)
RBC: 2.83 MIL/uL — ABNORMAL LOW (ref 4.22–5.81)
RDW: 14.9 % (ref 11.5–15.5)
WBC: 7.9 10*3/uL (ref 4.0–10.5)

## 2012-02-23 LAB — COMPREHENSIVE METABOLIC PANEL
ALT: 233 U/L — ABNORMAL HIGH (ref 0–53)
AST: 173 U/L — ABNORMAL HIGH (ref 0–37)
Albumin: 2.6 g/dL — ABNORMAL LOW (ref 3.5–5.2)
Alkaline Phosphatase: 29 U/L — ABNORMAL LOW (ref 39–117)
BUN: 25 mg/dL — ABNORMAL HIGH (ref 6–23)
CO2: 28 mEq/L (ref 19–32)
Calcium: 7.7 mg/dL — ABNORMAL LOW (ref 8.4–10.5)
Chloride: 105 mEq/L (ref 96–112)
Creatinine, Ser: 0.76 mg/dL (ref 0.50–1.35)
GFR calc Af Amer: >90 mL/min (ref >90)
GFR calc non Af Amer: >90 mL/min (ref >90)
Glucose, Bld: 109 mg/dL — ABNORMAL HIGH (ref 70–99)
Potassium: 3.7 mEq/L (ref 3.5–5.1)
Sodium: 140 mEq/L (ref 135–145)
Total Bilirubin: 0.9 mg/dL (ref 0.3–1.2)
Total Protein: 5 g/dL — ABNORMAL LOW (ref 6.0–8.3)

## 2012-02-23 MED ORDER — VANCOMYCIN HCL IN DEXTROSE 1-5 GM/200ML-% IV SOLN
1000.0000 mg | Freq: Two times a day (BID) | INTRAVENOUS | Status: DC
  Administered 2012-02-23 – 2012-02-26 (×7): 1000 mg via INTRAVENOUS
  Filled 2012-02-23 (×9): qty 200

## 2012-02-23 MED ORDER — POTASSIUM CHLORIDE CRYS ER 20 MEQ PO TBCR
30.0000 meq | EXTENDED_RELEASE_TABLET | Freq: Once | ORAL | Status: AC
  Administered 2012-02-23: 30 meq via ORAL
  Filled 2012-02-23: qty 1

## 2012-02-23 MED ORDER — TRAMADOL HCL 50 MG PO TABS: 50.0000 mg | ORAL_TABLET | Freq: Four times a day (QID) | ORAL | Status: DC | PRN

## 2012-02-23 MED ORDER — METOLAZONE 5 MG PO TABS
5.0000 mg | ORAL_TABLET | Freq: Every day | ORAL | Status: AC
  Administered 2012-02-23: 5 mg via ORAL
  Filled 2012-02-23: qty 1

## 2012-02-23 MED ORDER — TRAMADOL HCL 50 MG PO TABS
50.0000 mg | ORAL_TABLET | Freq: Four times a day (QID) | ORAL | Status: DC | PRN
  Administered 2012-02-23: 50 mg via ORAL
  Filled 2012-02-23: qty 2

## 2012-02-23 MED ORDER — SODIUM CHLORIDE 0.9 % IJ SOLN
10.0000 mL | Freq: Two times a day (BID) | INTRAMUSCULAR | Status: DC
  Administered 2012-02-23 – 2012-02-24 (×2): 20 mL
  Administered 2012-02-24 – 2012-02-25 (×3): 10 mL
  Administered 2012-02-26 – 2012-02-27 (×3): 20 mL
  Administered 2012-02-28: 10 mL

## 2012-02-23 MED ORDER — INFLUENZA VIRUS VACC SPLIT PF IM SUSP: 0.5000 mL | INTRAMUSCULAR | Status: DC | PRN

## 2012-02-23 MED ORDER — MORPHINE SULFATE 2 MG/ML IJ SOLN: 2.0000 mg | INTRAMUSCULAR | Status: DC | PRN

## 2012-02-23 MED ORDER — POTASSIUM CHLORIDE CRYS ER 20 MEQ PO TBCR
EXTENDED_RELEASE_TABLET | ORAL | Status: AC
  Administered 2012-02-23: 20 meq
  Filled 2012-02-23: qty 2

## 2012-02-23 MED ORDER — SODIUM CHLORIDE 0.9 % IJ SOLN: 10.0000 mL | INTRAMUSCULAR | Status: DC | PRN

## 2012-02-23 NOTE — Progress Notes (Signed)
Physical Therapy Treatment Patient Details Name: [name] MRN: [id number] DOB: 09-Apr-1951 Today's Date: 02/23/2012 Time: 1191-4782 PT Time Calculation (min): 24 min  PT Assessment / Plan / Recommendation Comments on Treatment Session  Pt adm for emergent TAA repair.  Pt making slow progress.  Feel he is going to need CIR before return home.  Recommend CIR consult.    Follow Up Recommendations  Inpatient Rehab    Barriers to Discharge        Equipment Recommendations  Rolling walker with 5" wheels    Recommendations for Other Services Rehab consult;OT consult  Frequency Min 3X/week   Plan Discharge plan needs to be updated;Frequency remains appropriate    Precautions / Restrictions Precautions Precautions: Sternal;Fall   Pertinent Vitals/Pain VSS    Mobility  Bed Mobility Supine to Sit: 1: +2 Total assist;HOB elevated Supine to Sit: Patient Percentage: 60% Sitting - Scoot to Edge of Bed: 3: Mod assist Details for Bed Mobility Assistance: Assist to bring trunk up and bring hips to EOB using bed pad. Transfers Sit to Stand: 1: +2 Total assist;Without upper extremity assist;From bed Sit to Stand: Patient Percentage: 60% Stand to Sit: 1: +2 Total assist;Without upper extremity assist;To chair/3-in-1 Details for Transfer Assistance: Cues to use legs and assist to bring hips up. Ambulation/Gait Ambulation/Gait Assistance: 1: +2 Total assist Ambulation/Gait: Patient Percentage: 60% Ambulation Distance (Feet): 14 Feet (8' x 1, 6' x 1) Assistive device: Rolling walker Ambulation/Gait Assistance Details: Verbal cues to stand more erect and stay closer to walker Gait Pattern: Step-to pattern;Decreased step length - left;Decreased stance time - right;Shuffle;Trunk flexed Gait velocity: significantly slowed.    Exercises     PT Diagnosis:    PT Problem List:   PT Treatment Interventions:     PT Goals Acute Rehab PT Goals Pt will go Supine/Side to Sit: with  supervision PT Goal: Supine/Side to Sit - Progress: Progressing toward goal PT Goal: Sit to Stand - Progress: Progressing toward goal PT Goal: Stand to Sit - Progress: Progressing toward goal PT Goal: Ambulate - Progress: Progressing toward goal  Visit Information  Last PT Received On: 02/23/12 Assistance Needed: +2    Subjective Data  Subjective: "I want it out," pt stated about foley catheter.   Cognition  Overall Cognitive Status: Impaired Difficult to assess due to: Level of arousal Arousal/Alertness: Lethargic Orientation Level: Disoriented to;Time Behavior During Session: Lethargic Cognition - Other Comments: Slow to process due to lethargy.    Balance  Static Standing Balance Static Standing - Balance Support: Bilateral upper extremity supported (on walker) Static Standing - Level of Assistance: 4: Min assist  End of Session PT - End of Session Equipment Utilized During Treatment: Oxygen Activity Tolerance: Patient limited by fatigue Patient left: in chair;with call bell/phone within reach;with nursing in room;with family/visitor present Nurse Communication: Mobility status   GP     [name] 02/23/2012, 12:27 PM  [employer] PT [phone]

## 2012-02-23 NOTE — Evaluation (Signed)
Clinical/Bedside Swallow Evaluation Patient Details  Name: MAXINE NISH MRN: [id number] Date of Birth: 31-Aug-1950  Today's Date: 02/23/2012 Time: 6578-4696 SLP Time Calculation (min): 13 min  Past Medical History:  Past Medical History  Diagnosis Date  . OBSTRUCTIVE SLEEP APNEA 02/2008 sleep study  . MIGRAINE HEADACHE   . ERECTILE DYSFUNCTION, ORGANIC   . Unspecified vitamin D deficiency   . HYPERLIPIDEMIA   . ASTHMA   . ALLERGIC RHINITIS    Past Surgical History:  Past Surgical History  Procedure Date  . Nasal fx without repair   . Thoracic aortic aneurysm repair 02/20/2012    Procedure: THORACIC ASCENDING ANEURYSM REPAIR (AAA);  Surgeon: Kerin Perna, MD;  Location: [facility] OR;  Service: Open Heart Surgery;  Laterality: N/A;   HPI:  61 y.o. male admitted for emergent repair of AAA 02/20/12.  Intubated for surgery.  Started on clear liquid diet 9/9, noted to have occasional cough associated with POs.     Assessment / Plan / Recommendation Clinical Impression  Pt presents with functional swallow with no evidence of sensorimotor deficits/cranial nerve dysfunction.  Tolerated thin liquids (no solids administered) with swift swallow trigger and no evidence of airway compromise.  Given today's assessment, no other comorbidities that would impact swallowing, rec continuing clear liquids and advancing to solids when he is medically ready.  SLP to sign-off.    Aspiration Risk  Mild    Diet Recommendation  continue clear liquids; advance when medically ready  Liquid Administration via: Cup;Straw Medication Administration: Whole meds with liquid Supervision: Patient able to self feed    Other  Recommendations Oral Care Recommendations: Oral care BID   Follow Up Recommendations  None    Windel Keziah L. Samson Frederic, [place] CCC/SLP Pager [phone]   Blenda Mounts Laurice 02/23/2012,12:09 PM

## 2012-02-23 NOTE — Consult Note (Signed)
Physical Medicine and Rehabilitation Consult Reason for Consult: Status post emergent thoracic aneurysm repair with deconditioning Referring Physician: Dr. Zenaida Niece trigt   HPI: [name] is a 61 y.o. right-handed male with obstructive sleep apnea. 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 Admitted 02/20/2012 with transient symptoms of headache difficulty with speech and leg weakness. Head CT scan was performed that was negative. He started having chest pain and a CT scan was performed rule out pulmonary embolism. A second CTA of the thoracic and abdominal aorta demonstrated a type A dissection with the origin and the asymmetry aorta above the aortic valve continuing down just above the aortic bifurcation. Underwent emergent repair of thoracic aortic dissection 02/20/2012 per Dr. Donata Clay. Noted bouts of hypoxia patient refusing oxygen at times. Followup speech therapy questionable dysphagia currently maintained on a clear liquid diet. Postoperative anemia 7.5-8.9 and monitored. A mediastinal chest tube has been in place and vancomycin was initiated prophylactically. Physical therapy ongoing for deconditioning request physical medicine rehabilitation consult to consider inpatient rehabilitation services   Review of Systems  Respiratory: Positive for shortness of breath.   Cardiovascular: Positive for palpitations.  Neurological: Positive for weakness.  All other systems reviewed and are negative.   Past Medical History  Diagnosis Date  . OBSTRUCTIVE SLEEP APNEA 02/2008 sleep study  . MIGRAINE HEADACHE   . ERECTILE DYSFUNCTION, ORGANIC   . Unspecified vitamin D deficiency   . HYPERLIPIDEMIA   . ASTHMA   . ALLERGIC RHINITIS    Past Surgical History  Procedure Date  . Nasal fx without repair   . Thoracic aortic aneurysm repair 02/20/2012    Procedure: THORACIC ASCENDING ANEURYSM REPAIR (AAA);  Surgeon: Kerin Perna, MD;  Location: [facility] OR;   Service: Open Heart Surgery;  Laterality: N/A;   Family History  Problem Relation Age of Onset  . Diabetes Mother   . Heart disease Maternal Uncle   . Heart disease Paternal Uncle   . Heart disease Cousin   . Lung cancer Cousin     2 cousins   Social History:  reports that he has been smoking.  He does not have any smokeless tobacco history on file. He reports that he drinks alcohol. His drug history not on file. Allergies:  Allergies  Allergen Reactions  . Testosterone Shortness Of Breath    Medication:Androgel Pt states trouble breathing at night   Medications Prior to Admission  Medication Sig Dispense Refill  . ALPRAZolam (XANAX) 1 MG tablet Take 1 mg by mouth at bedtime as needed. For insomnia      . cholecalciferol (VITAMIN D) 1000 UNITS tablet Take 2,000 Units by mouth daily.      . Cyanocobalamin (VITAMIN B-12 PO) Take 1 tablet by mouth daily.      [name] EPINEPHrine (EPI-PEN) 0.3 mg/0.3 mL DEVI Inject 0.3 mg into the muscle as needed. For anaphylaxis       . ezetimibe-simvastatin (VYTORIN) 10-40 MG per tablet Take 0.5 tablets by mouth daily.      . fexofenadine (ALLEGRA) 180 MG tablet Take 180 mg by mouth daily.        . fluticasone (FLONASE) 50 MCG/ACT nasal spray Place 2 sprays into the nose daily.      . montelukast (SINGULAIR) 10 MG tablet Take 10 mg by mouth daily.      [name] omega-3 acid ethyl esters (LOVAZA) 1 G capsule Take 1 g by mouth 2 (two) times daily.      [name]  predniSONE (DELTASONE) 5 MG tablet Take 5 mg by mouth daily as needed. For asthma flare up      . rosuvastatin (CRESTOR) 10 MG tablet Take 10 mg by mouth daily.      [name] tretinoin (RETIN-A) 0.1 % cream Apply 1 application topically daily as needed. 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 Status:  Mobility: Bed Mobility Bed Mobility: Supine to Sit;Sitting - Scoot to Edge of Bed Supine to Sit: 1: +2 Total assist;HOB elevated Supine to Sit: Patient Percentage: 60% Sitting - Scoot to Edge of Bed: 3: Mod assist Transfers Transfers: Sit to Stand;Stand to [employer] Transfers Sit to Stand: 1: +2 Total assist;Without upper extremity assist;From bed Sit to Stand: Patient Percentage: 60% Stand to Sit: 1: +2 Total assist;Without upper extremity assist;To chair/3-in-1 Stand to Sit: Patient Percentage: 60% Stand Pivot Transfers: 1: +2 Total assist Stand Pivot Transfers: Patient Percentage: 60% Ambulation/Gait Ambulation/Gait Assistance: 1: +2 Total assist Ambulation/Gait: Patient Percentage: 60% Ambulation Distance (Feet): 14 Feet (8' x 1, 6' x 1) Assistive device: Rolling walker Ambulation/Gait Assistance Details: Verbal cues to stand more erect and stay closer to walker Gait Pattern: Step-to pattern;Decreased step length - left;Decreased stance time - right;Shuffle;Trunk flexed Gait velocity: significantly slowed.    ADL:    Cognition: Cognition Arousal/Alertness: Lethargic Orientation Level: Oriented X4 Cognition Overall Cognitive Status: Impaired Difficult to assess due to: Level of arousal Arousal/Alertness: Lethargic Orientation Level: Disoriented to;Time Behavior During Session: Lethargic Cognition - Other Comments: Slow to process due to lethargy.  Blood pressure 114/65, pulse 72, temperature 98.6 F (37 C), temperature source Oral, resp. rate 20, height 6\' 4"  (1.93 m), weight 118.8 kg (261 lb 14.5 oz), SpO2 95.00%. Physical Exam  Vitals reviewed. Constitutional: He is oriented to person, place, and time.  HENT:  Head: Normocephalic.    Eyes:       Pupils round and reactive to light  Neck: Neck supple. No thyromegaly present.  Cardiovascular: Normal rate and regular rhythm.   Pulmonary/Chest: Breath sounds normal. No respiratory distress. He has no wheezes.  Abdominal: Bowel sounds are normal. He exhibits no distension. There is no tenderness.  Musculoskeletal: He exhibits edema.  Neurological: He is alert and oriented to person, place, and time. He has normal reflexes.       Follows three-step commands. Slow with thought processin. Memory slightly impaired. Moves all 4's. 2-3/5. Tingling in fingers  Skin:       Chest incision clean and dry  Psychiatric: He has a normal mood and affect.    Results for orders placed during the hospital encounter of 02/19/12 (from the past 24 hour(s))  GLUCOSE, CAPILLARY     Status: Abnormal   Collection Time   02/22/12  3:28 PM      Component Value Range   Glucose-Capillary 130 (*) 70 - 99 mg/dL   Comment 1 Documented in Chart     Comment 2 Notify RN    GLUCOSE, CAPILLARY     Status: Abnormal   Collection Time   02/22/12  7:58 PM  Component Value Range   Glucose-Capillary 116 (*) 70 - 99 mg/dL   Comment 1 Documented in Chart     Comment 2 Notify RN    CBC     Status: Abnormal   Collection Time   02/22/12  8:29 PM      Component Value Range   WBC 7.5  4.0 - 10.5 K/uL   RBC 2.77 (*) 4.22 - 5.81 MIL/uL   Hemoglobin 8.6 (*) 13.0 - 17.0 g/dL   HCT 78.2 (*) 95.6 - 21.3 %   MCV 91.3  78.0 - 100.0 fL   MCH 31.0  26.0 - 34.0 pg   MCHC 34.0  30.0 - 36.0 g/dL   RDW 08.6  57.8 - 46.9 %   Platelets 45 (*) 150 - 400 K/uL  BASIC METABOLIC PANEL     Status: Abnormal   Collection Time   02/22/12  8:29 PM      Component Value Range   Sodium 137  135 - 145 mEq/L   Potassium 3.9  3.5 - 5.1 mEq/L   Chloride 103  96 - 112 mEq/L   CO2 24  19 - 32 mEq/L   Glucose, Bld 122 (*) 70 - 99 mg/dL   BUN 27 (*) 6 - 23 mg/dL   Creatinine, Ser 6.29  0.50 - 1.35 mg/dL   Calcium 7.6 (*) 8.4 - 10.5  mg/dL   GFR calc non Af Amer >90  >90 mL/min   GFR calc Af Amer >90  >90 mL/min  POCT I-STAT 3, BLOOD GAS (G3+)     Status: Abnormal   Collection Time   02/22/12  8:35 PM      Component Value Range   pH, Arterial 7.442  7.350 - 7.450   pCO2 arterial 35.9  35.0 - 45.0 mmHg   pO2, Arterial 48.0 (*) 80.0 - 100.0 mmHg   Bicarbonate 24.5 (*) 20.0 - 24.0 mEq/L   TCO2 26  0 - 100 mmol/L   O2 Saturation 86.0     Patient temperature 98.1 F     Collection site RADIAL, ALLEN'S TEST ACCEPTABLE     Sample type ARTERIAL    GLUCOSE, CAPILLARY     Status: Abnormal   Collection Time   02/22/12 11:47 PM      Component Value Range   Glucose-Capillary 124 (*) 70 - 99 mg/dL  GLUCOSE, CAPILLARY     Status: Abnormal   Collection Time   02/23/12  4:14 AM      Component Value Range   Glucose-Capillary 120 (*) 70 - 99 mg/dL  COMPREHENSIVE METABOLIC PANEL     Status: Abnormal   Collection Time   02/23/12  4:15 AM      Component Value Range   Sodium 140  135 - 145 mEq/L   Potassium 3.7  3.5 - 5.1 mEq/L   Chloride 105  96 - 112 mEq/L   CO2 28  19 - 32 mEq/L   Glucose, Bld 109 (*) 70 - 99 mg/dL   BUN 25 (*) 6 - 23 mg/dL   Creatinine, Ser 5.28  0.50 - 1.35 mg/dL   Calcium 7.7 (*) 8.4 - 10.5 mg/dL   Total Protein 5.0 (*) 6.0 - 8.3 g/dL   Albumin 2.6 (*) 3.5 - 5.2 g/dL   AST 413 (*) 0 - 37 U/L   ALT 233 (*) 0 - 53 U/L   Alkaline Phosphatase 29 (*) 39 - 117 U/L   Total Bilirubin 0.9  0.3 - 1.2 mg/dL  GFR calc non Af Amer >90  >90 mL/min   GFR calc Af Amer >90  >90 mL/min  CBC     Status: Abnormal   Collection Time   02/23/12  4:15 AM      Component Value Range   WBC 7.9  4.0 - 10.5 K/uL   RBC 2.83 (*) 4.22 - 5.81 MIL/uL   Hemoglobin 8.9 (*) 13.0 - 17.0 g/dL   HCT 96.2 (*) 95.2 - 84.1 %   MCV 92.6  78.0 - 100.0 fL   MCH 31.4  26.0 - 34.0 pg   MCHC 34.0  30.0 - 36.0 g/dL   RDW 32.4  40.1 - 02.7 %   Platelets 59 (*) 150 - 400 K/uL  GLUCOSE, CAPILLARY     Status: Abnormal   Collection Time    02/23/12  7:27 AM      Component Value Range   Glucose-Capillary 122 (*) 70 - 99 mg/dL  GLUCOSE, CAPILLARY     Status: Abnormal   Collection Time   02/23/12 11:55 AM      Component Value Range   Glucose-Capillary 128 (*) 70 - 99 mg/dL   Dg Chest Port 1 View  02/23/2012  *RADIOLOGY REPORT*  Clinical Data: Postop open heart surgery  PORTABLE CHEST - 1 VIEW  Comparison: Chest radiograph 02/22/2012  Findings: Sternotomy wires overlie stable enlarged heart silhouette.  Mediastinal drain is again noted.  There is dense left lower lobe atelectasis and effusion.  There is mild central venous congestion not significantly changed.  No pneumothorax.  Interval removal of right IJ sheath.  IMPRESSION:  1.  No significant change. 2.  Cardiomegaly, left basilar atelectasis, and central venous congestion.   Original Report Authenticated By: Genevive Bi, M.D.    Dg Chest Portable 1 View In Am  02/22/2012  *RADIOLOGY REPORT*  Clinical Data: Status post cardiac surgery.  PORTABLE CHEST - 1 VIEW  Comparison: Chest x-ray 02/21/2012.  Findings: Nasogastric tube, endotracheal tube and previously noted Swan-Ganz catheter have all been removed.  Mediastinal and pericardial drains remain in position.  Lung volumes are persistently low, and there are bibasilar opacities favored to represent areas of postoperative atelectasis, with superimposed small bilateral pleural effusions.  Mild pulmonary venous congestion without frank pulmonary edema.  Mild enlargement of the cardiopericardial silhouette is unchanged.  IMPRESSION: 1.  Support apparatus, as above. 2.  Persistent low lung volumes with bibasilar areas of postoperative atelectasis and small bilateral pleural effusions, similar to the recent prior.   Original Report Authenticated By: Florencia Reasons, M.D.     Assessment/Plan: Diagnosis: thoracic aortic aneurysm s/p repair, encephalopathy 1. Does the need for close, 24 hr/day medical supervision in concert with the  patient's rehab needs make it unreasonable for this patient to be served in a less intensive setting? Yes and Potentially 2. Co-Morbidities requiring supervision/potential complications: see above 3. Due to bladder management, bowel management, safety, skin/wound care, disease management, medication administration, pain management and patient education, does the patient require 24 hr/day rehab nursing? Yes 4. Does the patient require coordinated care of a physician, rehab nurse, PT (1-2 hrs/day, 5 days/week), OT (1-2 hrs/day, 5 days/week) and SLP (1 hrs/day, 5 days/week) to address physical and functional deficits in the context of the above medical diagnosis(es)? Yes Addressing deficits in the following areas: balance, endurance, locomotion, strength, transferring, bowel/bladder control, bathing, dressing, feeding, grooming, toileting, cognition and psychosocial support 5. Can the patient actively participate in an intensive therapy program of at least 3  hrs of therapy per day at least 5 days per week? Potentially 6. The potential for patient to make measurable gains while on inpatient rehab is excellent 7. Anticipated functional outcomes upon discharge from inpatient rehab are mod I to supervision with PT, mod I to minassist with OT,  Mod I with SLP. 8. Estimated rehab length of stay to reach the above functional goals is: 2 weeks 9. Does the patient have adequate social supports to accommodate these discharge functional goals? Yes and Potentially 10. Anticipated D/C setting: Home 11. Anticipated post D/C treatments: HH therapy 12. Overall Rehab/Functional Prognosis: excellent  RECOMMENDATIONS: This patient's condition is appropriate for continued rehabilitative care in the following setting: CIR Patient has agreed to participate in recommended program. Yes Note that insurance prior authorization may be required for reimbursement for recommended care.  Comment:Rehab RN to follow up.   Ivory Broad, MD     02/23/2012

## 2012-02-23 NOTE — Progress Notes (Addendum)
3 Days Post-Op Procedure(s) (LRB): THORACIC ASCENDING ANEURYSM REPAIR (AAA) (N/A)  Subjective: Somewhat sleepy this am.  Objective: Vital signs in last 24 hours: Patient Vitals for the past 24 hrs:  BP Temp Temp src Pulse Resp SpO2 Weight  02/23/12 0726 - 98.1 F (36.7 C) Oral - - - -  02/23/12 0700 122/41 mmHg - - 67  17  94 % -  02/23/12 0600 138/80 mmHg - - 76  19  92 % -  02/23/12 0515 - - - 75  19  95 % -  02/23/12 0500 128/52 mmHg - - 81  18  87 % 261 lb 14.5 oz (118.8 kg)  02/23/12 0445 - - - 75  18  97 % -  02/23/12 0430 125/62 mmHg - - 76  17  89 % -  02/23/12 0415 - - - 76  16  95 % -  02/23/12 0400 126/61 mmHg 98 F (36.7 C) Oral 74  16  96 % -  02/23/12 0345 - - - 69  19  95 % -  02/23/12 0330 138/75 mmHg - - 77  18  96 % -  02/23/12 0300 129/71 mmHg - - 72  19  99 % -  02/23/12 0230 135/67 mmHg - - 74  18  98 % -  02/23/12 0200 140/67 mmHg - - 74  18  100 % -  02/23/12 0130 138/74 mmHg - - 76  18  98 % -  02/23/12 0100 107/64 mmHg - - 75  17  97 % -  02/23/12 0030 151/70 mmHg - - 81  18  97 % -  02/23/12 0000 153/74 mmHg 98 F (36.7 C) Oral 81  17  98 % -  02/22/12 2330 145/85 mmHg - - 81  20  93 % -  02/22/12 2300 130/72 mmHg - - 81  20  91 % -  02/22/12 2245 - - - 78  19  92 % -  02/22/12 2230 142/81 mmHg - - 79  21  90 % -  02/22/12 2215 142/78 mmHg - - 81  19  91 % -  02/22/12 2200 147/77 mmHg - - 80  18  92 % -  02/22/12 2145 140/67 mmHg - - 81  17  97 % -  02/22/12 2130 145/72 mmHg - - 82  19  97 % -  02/22/12 2115 141/77 mmHg - - 82  18  93 % -  02/22/12 2100 123/63 mmHg - - 82  18  95 % -  02/22/12 2045 138/66 mmHg - - 80  19  89 % -  02/22/12 2030 121/69 mmHg - - 80  19  89 % -  02/22/12 2015 125/67 mmHg - - 78  21  93 % -  02/22/12 2000 134/67 mmHg - - 72  15  97 % -  02/22/12 1953 - 98.1 F (36.7 C) Oral - - - -  02/22/12 1900 131/70 mmHg - - 70  17  96 % -  02/22/12 1800 130/64 mmHg - - 74  17  95 % -  02/22/12 1700 132/79 mmHg - - 72  15  97  % -  02/22/12 1600 128/69 mmHg - - 69  15  97 % -  02/22/12 1529 - 97.2 F (36.2 C) Oral - - - -  02/22/12 1500 133/68 mmHg - - 72  18  96 % -  02/22/12 1402 - - - - - 96 % -  02/22/12 1400 126/67 mmHg - - 67  18  96 % -  02/22/12 1340 - - - - - 97 % -  02/22/12 1300 134/77 mmHg - - 72  18  100 % -  02/22/12 1200 127/68 mmHg - - 70  16  99 % -  02/22/12 1148 - 97.3 F (36.3 C) Oral - - - -  02/22/12 1100 124/60 mmHg - - 66  19  96 % -  02/22/12 1000 154/79 mmHg - - 75  14  97 % -  02/22/12 0900 122/71 mmHg - - 77  19  98 % -  02/22/12 0845 - - - - - 97 % -   Pre op weight  110 kg Current Weight  02/23/12 261 lb 14.5 oz (118.8 kg)      Intake/Output from previous day: 09/10 0701 - 09/11 0700 In: 1392.2 [P.O.:780; I.V.:450.2; IV Piggyback:162] Out: 5435 [Urine:4905; Chest Tube:530]   Physical Exam:  Cardiovascular: RRR Pulmonary: Diminished at bases; no rales, wheezes, or rhonchi. Abdomen: Soft, non tender, bowel sounds present. Extremities: SCDs in place. Wounds: Clean and dry.  No erythema or signs of infection.  Lab Results: CBC: Basename 02/23/12 0415 02/22/12 2029  WBC 7.9 7.5  HGB 8.9* 8.6*  HCT 26.2* 25.3*  PLT 59* 45*   BMET:  Basename 02/23/12 0415 02/22/12 2029  NA 140 137  K 3.7 3.9  CL 105 103  CO2 28 24  GLUCOSE 109* 122*  BUN 25* 27*  CREATININE 0.76 0.73  CALCIUM 7.7* 7.6*    PT/INR:  Lab Results  Component Value Date   INR 1.50* 02/20/2012   ABG:  INR: Will add last result for INR, ABG once components are confirmed Will add last 4 CBG results once components are confirmed  Assessment/Plan:  1. CV - SR. Continue Lopressor 12.5 2.  Pulmonary - Encourage incentive spirometer.CXR this am shows left base atelectasis, mild central venous congestion, and no ptx.530 cc of output from chest tube. Leave for now. 3. Volume Overload - Continue Lasix IV 4.  Acute blood loss anemia - H and H stable this am at 8.9 and 26.2 5.Platelets increased to  59,000. 6.Supplement potassium. 7.Per Dr. Donata Clay, patient still with mediastinal chest tube. As a result, Vancomycin will be started prophylactically.  ZIMMERMAN,DONIELLE MPA-C 02/23/2012

## 2012-02-23 NOTE — Progress Notes (Signed)
[facility] BRIEF SICU PROGRESS NOTE  3 Days Post-Op  S/P Procedure(s) (LRB): THORACIC ASCENDING ANEURYSM REPAIR (AAA) (N/A)   Stable day Ambulated some for the first time w/ assistance Stable hemodynamics and BP Excellent UOP  Plan: Continue current plan  Zerina Hallinan H 02/23/2012 6:22 PM

## 2012-02-23 NOTE — Progress Notes (Signed)
Peripherally Inserted Central Catheter/Midline Placement  The IV Nurse has discussed with the patient and/or persons authorized to consent for the patient, the purpose of this procedure and the potential benefits and risks involved with this procedure.  The benefits include less needle sticks, lab draws from the catheter and patient may be discharged home with the catheter.  Risks include, but not limited to, infection, bleeding, blood clot (thrombus formation), and puncture of an artery; nerve damage and irregular heat beat.  Alternatives to this procedure were also discussed.  PICC/Midline Placement Documentation        [name] 02/23/2012, 3:34 PM

## 2012-02-23 NOTE — Progress Notes (Signed)
3 Days Post-Op Procedure(s) (LRB): THORACIC ASCENDING ANEURYSM REPAIR (AAA) (N/A) Subjective: S/p repair type A dissection, replacement of ascending  Aorta and hemiarch, resuspension of Ao valve Walked in hall today Objective: Vital signs in last 24 hours: Temp:  [97.7 F (36.5 C)-98.6 F (37 C)] 97.7 F (36.5 C) (09/11 1617) Pulse Rate:  [65-82] 79  (09/11 1800) Cardiac Rhythm:  [-] Normal sinus rhythm (09/11 1600) Resp:  [14-21] 16  (09/11 1800) BP: (102-153)/(41-85) 130/60 mmHg (09/11 1800) SpO2:  [87 %-100 %] 99 % (09/11 1800) FiO2 (%):  [40 %-50 %] 40 % (09/11 0600) Weight:  [261 lb 14.5 oz (118.8 kg)] 261 lb 14.5 oz (118.8 kg) (09/11 0500)  Hemodynamic parameters for last 24 hours:  NSR  Intake/Output from previous day: 09/10 0701 - 09/11 0700 In: 1392.2 [P.O.:780; I.V.:450.2; IV Piggyback:162] Out: 5435 [Urine:4905; Chest Tube:530] Intake/Output this shift: Total I/O In: 1058 [P.O.:650; I.V.:200; IV Piggyback:208] Out: 225 [Urine:225]  Good pulses, responsive, LOC better  Lab Results:  [name] 02/23/12 0415 02/22/12 2029  WBC 7.9 7.5  HGB 8.9* 8.6*  HCT 26.2* 25.3*  PLT 59* 45*   BMET:  Basename 02/23/12 0415 02/22/12 2029  NA 140 137  K 3.7 3.9  CL 105 103  CO2 28 24  GLUCOSE 109* 122*  BUN 25* 27*  CREATININE 0.76 0.73  CALCIUM 7.7* 7.6*    PT/INR: No results found for this basename: LABPROT,INR in the last 72 hours ABG    Component Value Date/Time   PHART 7.442 02/22/2012 2035   HCO3 24.5* 02/22/2012 2035   TCO2 26 02/22/2012 2035   ACIDBASEDEF 1.0 02/21/2012 1351   O2SAT 86.0 02/22/2012 2035   CBG (last 3)   Basename 02/23/12 1615 02/23/12 1155 02/23/12 0727  GLUCAP 113* 128* 122*    Assessment/Plan: S/P Procedure(s) (LRB): THORACIC ASCENDING ANEURYSM REPAIR (AAA) (N/A) Leave MT until output < 100cc/24 hr   LOS: 4 days    [name],[name] 02/23/2012

## 2012-02-24 ENCOUNTER — Inpatient Hospital Stay (HOSPITAL_COMMUNITY): Payer: 59

## 2012-02-24 DIAGNOSIS — G92 Toxic encephalopathy: Secondary | ICD-10-CM

## 2012-02-24 DIAGNOSIS — G929 Unspecified toxic encephalopathy: Secondary | ICD-10-CM

## 2012-02-24 DIAGNOSIS — R5381 Other malaise: Secondary | ICD-10-CM

## 2012-02-24 LAB — TYPE AND SCREEN
Unit division: 0
Unit division: 0

## 2012-02-24 LAB — COMPREHENSIVE METABOLIC PANEL
ALT: 185 U/L — ABNORMAL HIGH (ref 0–53)
AST: 99 U/L — ABNORMAL HIGH (ref 0–37)
Albumin: 2.8 g/dL — ABNORMAL LOW (ref 3.5–5.2)
Alkaline Phosphatase: 34 U/L — ABNORMAL LOW (ref 39–117)
BUN: 20 mg/dL (ref 6–23)
CO2: 29 mEq/L (ref 19–32)
Calcium: 8.3 mg/dL — ABNORMAL LOW (ref 8.4–10.5)
Chloride: 99 mEq/L (ref 96–112)
Creatinine, Ser: 0.69 mg/dL (ref 0.50–1.35)
GFR calc Af Amer: >90 mL/min (ref >90)
GFR calc non Af Amer: >90 mL/min (ref >90)
Glucose, Bld: 117 mg/dL — ABNORMAL HIGH (ref 70–99)
Potassium: 3.3 mEq/L — ABNORMAL LOW (ref 3.5–5.1)
Sodium: 136 mEq/L (ref 135–145)
Total Bilirubin: 1.1 mg/dL (ref 0.3–1.2)
Total Protein: 5.4 g/dL — ABNORMAL LOW (ref 6.0–8.3)

## 2012-02-24 LAB — CBC
HCT: 24.9 % — ABNORMAL LOW (ref 39.0–52.0)
Hemoglobin: 8.5 g/dL — ABNORMAL LOW (ref 13.0–17.0)
MCH: 31.4 pg (ref 26.0–34.0)
MCHC: 34.1 g/dL (ref 30.0–36.0)
MCV: 91.9 fL (ref 78.0–100.0)
Platelets: 92 10*3/uL — ABNORMAL LOW (ref 150–400)
RBC: 2.71 MIL/uL — ABNORMAL LOW (ref 4.22–5.81)
RDW: 14.6 % (ref 11.5–15.5)
WBC: 7.9 10*3/uL (ref 4.0–10.5)

## 2012-02-24 LAB — GLUCOSE, CAPILLARY
Glucose-Capillary: 105 mg/dL — ABNORMAL HIGH (ref 70–99)
Glucose-Capillary: 111 mg/dL — ABNORMAL HIGH (ref 70–99)
Glucose-Capillary: 116 mg/dL — ABNORMAL HIGH (ref 70–99)
Glucose-Capillary: 122 mg/dL — ABNORMAL HIGH (ref 70–99)
Glucose-Capillary: 150 mg/dL — ABNORMAL HIGH (ref 70–99)
Glucose-Capillary: 168 mg/dL — ABNORMAL HIGH (ref 70–99)

## 2012-02-24 MED ORDER — INSULIN ASPART 100 UNIT/ML ~~LOC~~ SOLN
0.0000 [IU] | Freq: Three times a day (TID) | SUBCUTANEOUS | Status: DC
  Administered 2012-02-24: 2 [IU] via SUBCUTANEOUS

## 2012-02-24 MED ORDER — POTASSIUM CHLORIDE 10 MEQ/50ML IV SOLN
10.0000 meq | INTRAVENOUS | Status: AC
  Administered 2012-02-24 (×3): 10 meq via INTRAVENOUS
  Filled 2012-02-24: qty 50
  Filled 2012-02-24: qty 100
  Filled 2012-02-24: qty 50

## 2012-02-24 MED ORDER — ACETAMINOPHEN 325 MG PO TABS
650.0000 mg | ORAL_TABLET | Freq: Once | ORAL | Status: AC
  Administered 2012-02-24: 650 mg via ORAL
  Filled 2012-02-24: qty 2

## 2012-02-24 MED ORDER — POTASSIUM CHLORIDE CRYS ER 20 MEQ PO TBCR
20.0000 meq | EXTENDED_RELEASE_TABLET | Freq: Every day | ORAL | Status: DC
  Administered 2012-02-24 – 2012-02-29 (×6): 20 meq via ORAL
  Filled 2012-02-24 (×8): qty 1

## 2012-02-24 MED ORDER — LACTULOSE 10 GM/15ML PO SOLN
20.0000 g | Freq: Once | ORAL | Status: AC
  Administered 2012-02-24: 20 g via ORAL
  Filled 2012-02-24: qty 30

## 2012-02-24 MED ORDER — POTASSIUM CHLORIDE 10 MEQ/50ML IV SOLN
10.0000 meq | Freq: Once | INTRAVENOUS | Status: AC
  Administered 2012-02-24: 10 meq via INTRAVENOUS

## 2012-02-24 MED ORDER — HYDROCODONE-ACETAMINOPHEN 5-325 MG PO TABS
1.0000 | ORAL_TABLET | ORAL | Status: DC | PRN
  Administered 2012-02-24 – 2012-02-29 (×6): 1 via ORAL
  Filled 2012-02-24 (×6): qty 1

## 2012-02-24 NOTE — Progress Notes (Signed)
Physical Therapy Treatment Patient Details Name: [name] MRN: [id number] DOB: 08-20-1950 Today's Date: 02/24/2012 Time: 1191-4782 PT Time Calculation (min): 25 min  PT Assessment / Plan / Recommendation Comments on Treatment Session  Pt slowly progressing with mobility.  Decreased activity tolerance.      Follow Up Recommendations  Inpatient Rehab    Barriers to Discharge        Equipment Recommendations  Rolling walker with 5" wheels    Recommendations for Other Services Rehab consult;OT consult  Frequency Min 3X/week   Plan      Precautions / Restrictions Precautions Precautions: Sternal;Fall Restrictions Weight Bearing Restrictions: Yes (sternal precautions)    Pertinent Vitals/Pain C/o low back pain.  Not rated.      Mobility  Bed Mobility Bed Mobility: Supine to Sit;Sitting - Scoot to Edge of Bed Supine to Sit: 1: +2 Total assist;HOB flat;With rails Supine to Sit: Patient Percentage: 50% Sitting - Scoot to Edge of Bed: 3: Mod assist Details for Bed Mobility Assistance: Assist to bring trunk up and bring hips to EOB using bed pad. Transfers Transfers: Sit to Stand;Stand to Sit Sit to Stand: 1: +2 Total assist;From bed;From chair/3-in-1 Sit to Stand: Patient Percentage: 60% Stand to Sit: 1: +2 Total assist;Other (comment);To chair/3-in-1 Stand to Sit: Patient Percentage: 60% Details for Transfer Assistance: cues to minimize use of UE's due to sternal precautions.  Assist to achieve standing, anterior translation of trunk over BOS, balance, & controlled descent.   Ambulation/Gait Ambulation/Gait Assistance: 1: +2 Total assist Ambulation/Gait: Patient Percentage: 60% Ambulation Distance (Feet): 30 Feet (15' + 15' ) Assistive device: Other (Comment) (used w/c for UE support) Ambulation/Gait Assistance Details: Pt with flexed posture, slow & small steps.  Cues for tall posture & safety Gait Pattern: Step-to pattern;Decreased step length - left;Decreased  stance time - right;Shuffle;Trunk flexed Gait velocity: significantly slowed.      PT Goals Acute Rehab PT Goals Time For Goal Achievement: 03/07/12 Potential to Achieve Goals: Good PT Goal: Supine/Side to Sit - Progress: Not met PT Goal: Sit to Stand - Progress: Not met PT Goal: Stand to Sit - Progress: Not met PT Goal: Ambulate - Progress: Progressing toward goal  Visit Information  Last PT Received On: 02/24/12 Assistance Needed: +2    Subjective Data      Cognition  Overall Cognitive Status: Impaired Area of Impairment: Safety/judgement;Awareness of errors;Awareness of deficits;Problem solving Arousal/Alertness: Lethargic Orientation Level: Disoriented to;Time Behavior During Session: Lethargic Safety/Judgement: Decreased awareness of safety precautions;Decreased safety judgement for tasks assessed Awareness of Errors: Assistance required to identify errors made;Assistance required to correct errors made Cognition - Other Comments: Slow to process due to lethargy.    Balance     End of Session PT - End of Session Equipment Utilized During Treatment: Oxygen Activity Tolerance: Patient limited by fatigue Patient left: in chair;with call bell/phone within reach;with family/visitor present Nurse Communication: Mobility status     [name], [place] [phone] 02/24/2012

## 2012-02-24 NOTE — Evaluation (Signed)
Occupational Therapy Evaluation Patient Details Name: [name] MRN: [id number] DOB: 10-May-1951 Today's Date: 02/24/2012 Time: 4098-1191 OT Time Calculation (min): 25 min  OT Assessment / Plan / Recommendation Clinical Impression  Pt is recovering from an emergent TAA repair.  Pt requires +2 assist for mobility and significant assist for ADL.  Pt will benefit from OT to address deficit areas described below.  Recommend CIR consideration.    OT Assessment  Patient needs continued OT Services    Follow Up Recommendations  Inpatient Rehab    Barriers to Discharge      Equipment Recommendations  Rolling walker with 5" wheels;3 in 1 bedside comode    Recommendations for Other Services    Frequency  Min 2X/week    Precautions / Restrictions Precautions Precautions: Sternal;Fall Restrictions   Pertinent Vitals/Pain     ADL  Eating/Feeding: Simulated;Set up Where Assessed - Eating/Feeding: Chair Grooming: Performed;Wash/dry hands;Wash/dry face;Set up Where Assessed - Grooming: Supported sitting Upper Body Bathing: Simulated;Moderate assistance Where Assessed - Upper Body Bathing: Unsupported sitting Lower Body Bathing: Simulated;+1 Total assistance Where Assessed - Lower Body Bathing: Supported sit to stand;Unsupported sitting Upper Body Dressing: Performed;Minimal assistance Where Assessed - Upper Body Dressing: Unsupported sitting Lower Body Dressing: Performed;+1 Total assistance Where Assessed - Lower Body Dressing: Unsupported sitting;Supported sit to stand Toilet Transfer: Simulated;+2 Total assistance Toilet Transfer: Patient Percentage: 70% Toilet Transfer Method: Stand pivot Equipment Used: Gait belt Transfers/Ambulation Related to ADLs: transferred only with +2 assist, pt 70% ADL Comments: Pt limited by pain in neck/back.     OT Diagnosis: Generalized weakness;Acute pain;Cognitive deficits  OT Problem List: Decreased strength;Impaired balance (sitting  and/or standing);Decreased activity tolerance;Pain;Decreased knowledge of use of DME or AE;Decreased cognition OT Treatment Interventions: Self-care/ADL training;Energy conservation;DME and/or AE instruction;Patient/family education;Cognitive remediation/compensation   OT Goals Acute Rehab OT Goals OT Goal Formulation: With patient Time For Goal Achievement: 03/09/12 Potential to Achieve Goals: Good ADL Goals Pt Will Perform Grooming: Standing at sink;with min assist ADL Goal: Grooming - Progress: Goal set today Pt Will Perform Upper Body Bathing: with set-up;Sitting, edge of bed ADL Goal: Upper Body Bathing - Progress: Goal set today Pt Will Perform Lower Body Bathing: with min assist;Sit to stand from bed ADL Goal: Lower Body Bathing - Progress: Goal set today Pt Will Perform Upper Body Dressing: Sitting, bed;with set-up ADL Goal: Upper Body Dressing - Progress: Goal set today Pt Will Perform Lower Body Dressing: with min assist;Sit to stand from bed ADL Goal: Lower Body Dressing - Progress: Goal set today Pt Will Transfer to Toilet: Ambulation;3-in-1;with DME;with min assist ADL Goal: Toilet Transfer - Progress: Goal set today Pt Will Perform Toileting - Clothing Manipulation: with min assist;Standing ADL Goal: Toileting - Clothing Manipulation - Progress: Goal set today Pt Will Perform Toileting - Hygiene: with min assist;Sit to stand from 3-in-1/toilet ADL Goal: Toileting - Hygiene - Progress: Goal set today Miscellaneous OT Goals Miscellaneous OT Goal #1: Pt will adhere to sternal precautions during ADL. OT Goal: Miscellaneous Goal #1 - Progress: Goal set today  Visit Information  Last OT Received On: 02/24/12 Assistance Needed: +2    Subjective Data  Subjective: "I have a headache." Patient Stated Goal: Return to PLOF.   Prior Functioning  Vision/Perception  Home Living Lives With: Alone Available Help at Discharge: Family Type of Home: House Home Access: Level  entry Home Layout: Two level;Able to live on main level with bedroom/bathroom Bathroom Shower/Tub: Health visitor: Standard Home Adaptive Equipment: Built-in shower seat  Prior Function Level of Independence: Independent Able to Take Stairs?: Yes Driving: Yes Vocation: Full time employment Communication Communication: No difficulties Dominant Hand: Right      Cognition  Overall Cognitive Status: Appears within functional limits for tasks assessed/performed Area of Impairment: Memory Arousal/Alertness: Awake/alert Orientation Level: Appears intact for tasks assessed Behavior During Session: [facility] for tasks performed Memory: Decreased recall of precautions    Extremity/Trunk Assessment Right Upper Extremity Assessment RUE ROM/Strength/Tone: [facility] for tasks assessed;Unable to fully assess;Due to precautions Left Upper Extremity Assessment LUE ROM/Strength/Tone: [facility] for tasks assessed;Unable to fully assess;Due to precautions Trunk Assessment Trunk Assessment: Normal   Mobility  Shoulder Instructions  Bed Mobility Bed Mobility: Sit to Supine Sit to Supine: 1: +2 Total assist Sit to Supine: Patient Percentage: 50% Details for Bed Mobility Assistance:  assist for legs Transfers Transfers: Sit to Stand;Stand to Sit Sit to Stand: 1: +2 Total assist;From bed;From chair/3-in-1 Sit to Stand: Patient Percentage: 70% Stand to Sit: 1: +2 Total assist;Other (comment);To chair/3-in-1;2: Max assist Stand to Sit: Patient Percentage: 70%          Exercise     Balance     End of Session OT - End of Session Activity Tolerance: Patient limited by fatigue;Patient limited by pain Patient left: in bed;with call bell/phone within reach;with family/visitor present;with nursing in room Nurse Communication: Mobility status  GO     [name] 02/24/2012, 2:20 PM [phone]

## 2012-02-24 NOTE — Progress Notes (Signed)
4 Days Post-Op Procedure(s) (LRB): THORACIC ASCENDING ANEURYSM REPAIR (AAA) (N/A)  Subjective: His only complaint is his lower back hurts this am.Passing flatus but no bowel movement yet.  Objective: Vital signs in last 24 hours: Patient Vitals for the past 24 hrs:  BP Temp Temp src Pulse Resp SpO2 Weight  02/24/12 0800 133/65 mmHg 98.3 F (36.8 C) Oral 76  11  96 % -  02/24/12 0700 115/61 mmHg - - 81  18  94 % -  02/24/12 0600 125/59 mmHg - - 74  17  96 % -  02/24/12 0500 116/52 mmHg - - 73  12  94 % 246 lb 11.1 oz (111.9 kg)  02/24/12 0400 123/56 mmHg 98 F (36.7 C) Oral 67  13  94 % -  02/24/12 0300 125/60 mmHg - - 75  19  95 % 246 lb 11.1 oz (111.9 kg)  02/24/12 0200 - - - - 16  - -  02/24/12 0100 135/66 mmHg - - 78  21  95 % -  02/24/12 0000 129/65 mmHg 98.1 F (36.7 C) Oral 76  17  96 % -  02/23/12 2300 128/69 mmHg - - 78  17  95 % -  02/23/12 2200 101/68 mmHg - - 75  14  98 % -  02/23/12 2100 121/55 mmHg - - 80  16  97 % -  02/23/12 2030 - - - - - 97 % -  02/23/12 2000 112/59 mmHg - - 74  19  98 % -  02/23/12 1958 - 98 F (36.7 C) Oral - - - -  02/23/12 1900 119/63 mmHg - - 74  16  98 % -  02/23/12 1800 130/60 mmHg - - 79  16  99 % -  02/23/12 1700 122/65 mmHg - - 69  14  96 % -  02/23/12 1617 - 97.7 F (36.5 C) Oral - - - -  02/23/12 1600 126/53 mmHg 97.9 F (36.6 C) Oral 71  - 97 % -  02/23/12 1500 126/61 mmHg - - 65  - 95 % -  02/23/12 1400 123/64 mmHg - - 72  17  98 % -  02/23/12 1300 115/54 mmHg - - 71  19  98 % -  02/23/12 1200 112/62 mmHg 98.6 F (37 C) Oral 77  18  97 % -  02/23/12 1154 - 98.6 F (37 C) Oral - - - -  02/23/12 1100 114/65 mmHg - - 72  20  96 % -  02/23/12 1000 141/76 mmHg - - 79  18  97 % -  02/23/12 0949 102/58 mmHg - - 65  - - -  02/23/12 0919 - - - - - 96 % -  02/23/12 0900 119/55 mmHg - - 77  18  95 % -   Pre op weight  110 kg Current Weight  02/24/12 246 lb 11.1 oz (111.9 kg)      Intake/Output from previous day: 09/11 0701 -  09/12 0700 In: 1988 [P.O.:1250; I.V.:280; IV Piggyback:458] Out: 1610 [RUEAV:4098; Chest Tube:270]   Physical Exam:  Cardiovascular: RRR Pulmonary: Diminished at bases; no rales, wheezes, or rhonchi. Abdomen: Soft, non tender, bowel sounds present. Extremities: SCDs in place. Wounds: Clean and dry.  No erythema or signs of infection.  Lab Results: CBC:  Basename 02/24/12 0410 02/23/12 0415  WBC 7.9 7.9  HGB 8.5* 8.9*  HCT 24.9* 26.2*  PLT 92* 59*   BMET:   Basename 02/24/12 0410 02/23/12 0415  NA 136 140  K 3.3* 3.7  CL 99 105  CO2 29 28  GLUCOSE 117* 109*  BUN 20 25*  CREATININE 0.69 0.76  CALCIUM 8.3* 7.7*    PT/INR:  Lab Results  Component Value Date   INR 1.50* 02/20/2012   ABG:  INR: Will add last result for INR, ABG once components are confirmed Will add last 4 CBG results once components are confirmed  Assessment/Plan:  1. CV - SR. Continue Lopressor 12.5 bid 2.  Pulmonary - Encourage incentive spirometer.CXR this am shows patient rotated to the right, left base atelectasis, mild central venous congestion, and no ptx.230 cc of output from chest tube. Leave for now.Hope to remove in am. 3. Volume Overload - On Lasix IV bid. Consider changing to oral. 4.  Acute blood loss anemia - H and H this am 8.5 and 24.9 5.Thrombocytopenia-Platelets increased to 92,000. 6.Supplement potassium. 7. LOC constipation.  ZIMMERMAN,DONIELLE MPA-C 02/24/2012

## 2012-02-24 NOTE — Progress Notes (Signed)
Status post repair type a dissection with ascending aorta and hemi-arch repair with a heme shield  graft and resuspension of aortic valve for AI  Still has style 2 for persistent drainage associated with a low platelet count the drainage is decreasing  Walking in hallway but still very weak, patient is been accepted for inpatient rehabilitation probably next week  Oxygenation improved with diuresis almost back to preop weight, history of heavy smoking and COPD  Pulses in all extremities  Plan transfer to step down 1 patient can walk with improved strength and stability  Lab Results  Component Value Date   WBC 7.9 02/24/2012   HGB 8.5* 02/24/2012   HCT 24.9* 02/24/2012   MCV 91.9 02/24/2012   PLT 92* 02/24/2012   Lab Results  Component Value Date   CREATININE 0.69 02/24/2012   BUN 20 02/24/2012   NA 136 02/24/2012   K 3.3* 02/24/2012   CL 99 02/24/2012   CO2 29 02/24/2012   renal

## 2012-02-25 LAB — BASIC METABOLIC PANEL
BUN: 21 mg/dL (ref 6–23)
CO2: 29 mEq/L (ref 19–32)
Calcium: 8.6 mg/dL (ref 8.4–10.5)
Creatinine, Ser: 0.78 mg/dL (ref 0.50–1.35)
Glucose, Bld: 110 mg/dL — ABNORMAL HIGH (ref 70–99)

## 2012-02-25 LAB — GLUCOSE, CAPILLARY
Glucose-Capillary: 107 mg/dL — ABNORMAL HIGH (ref 70–99)
Glucose-Capillary: 123 mg/dL — ABNORMAL HIGH (ref 70–99)
Glucose-Capillary: 146 mg/dL — ABNORMAL HIGH (ref 70–99)

## 2012-02-25 MED ORDER — POTASSIUM CHLORIDE CRYS ER 20 MEQ PO TBCR
40.0000 meq | EXTENDED_RELEASE_TABLET | Freq: Once | ORAL | Status: AC
  Administered 2012-02-25: 40 meq via ORAL
  Filled 2012-02-25: qty 2

## 2012-02-25 MED ORDER — INSULIN ASPART 100 UNIT/ML ~~LOC~~ SOLN
0.0000 [IU] | Freq: Three times a day (TID) | SUBCUTANEOUS | Status: DC
  Administered 2012-02-25: 2 [IU] via SUBCUTANEOUS
  Administered 2012-02-25: 4 [IU] via SUBCUTANEOUS
  Administered 2012-02-25 – 2012-02-26 (×2): 2 [IU] via SUBCUTANEOUS
  Administered 2012-02-26: 8 [IU] via SUBCUTANEOUS
  Administered 2012-02-26 – 2012-02-27 (×6): 2 [IU] via SUBCUTANEOUS
  Administered 2012-02-28: 4 [IU] via SUBCUTANEOUS
  Administered 2012-02-28 – 2012-02-29 (×5): 2 [IU] via SUBCUTANEOUS

## 2012-02-25 MED ORDER — POTASSIUM CHLORIDE 10 MEQ/50ML IV SOLN
10.0000 meq | INTRAVENOUS | Status: AC
  Administered 2012-02-25 (×3): 10 meq via INTRAVENOUS

## 2012-02-25 MED ORDER — POLYETHYLENE GLYCOL 3350 17 G PO PACK
17.0000 g | PACK | Freq: Once | ORAL | Status: AC
  Administered 2012-02-25: 17 g via ORAL
  Filled 2012-02-25: qty 1

## 2012-02-25 NOTE — Progress Notes (Addendum)
CIR: Met with pt and his partner to discuss CIR. Pt in agreement with plan to come to inpatient rehab when medically stable. MD noted that pt will be ready for CIR next week. Working on Therapist, occupational for [employer]. Will f/u on Monday. Please call with questions: [phone].

## 2012-02-25 NOTE — Progress Notes (Signed)
Up in chair  BP 125/57  Pulse 73  Temp 98.3 F (36.8 C) (Oral)  Resp 20  Ht 6\' 4"  (1.93 m)  Wt 235 lb 14.3 oz (107 kg)  BMI 28.71 kg/m2  SpO2 95%   Intake/Output Summary (Last 24 hours) at 02/25/12 1812 Last data filed at 02/25/12 1600  Gross per 24 hour  Intake   1150 ml  Output   1750 ml  Net   -600 ml    Stable day

## 2012-02-25 NOTE — Progress Notes (Addendum)
[facility] DAILY PROGRESS NOTE                   [address]            [place] [address]          [phone]      5 Days Post-Op Procedure(s) (LRB): THORACIC ASCENDING ANEURYSM REPAIR (AAA) (N/A)  Total Length of Stay:  LOS: 6 days   Subjective: Patient has occasional confusion and has not had a bowel movement yet.  Objective: Vital signs in last 24 hours: Temp:  [98.1 F (36.7 C)-98.8 F (37.1 C)] 98.2 F (36.8 C) (09/13 0750) Pulse Rate:  [61-82] 69  (09/13 0800) Cardiac Rhythm:  [-] Normal sinus rhythm (09/13 0800) Resp:  [13-21] 16  (09/13 0800) BP: (91-140)/(39-67) 123/51 mmHg (09/13 0800) SpO2:  [91 %-97 %] 96 % (09/13 0800) Weight:  [235 lb 14.3 oz (107 kg)] 235 lb 14.3 oz (107 kg) (09/13 0500)  Filed Weights   02/24/12 0300 02/24/12 0500 02/25/12 0500  Weight: 246 lb 11.1 oz (111.9 kg) 246 lb 11.1 oz (111.9 kg) 235 lb 14.3 oz (107 kg)   Weight change: -10 lb 12.8 oz (-4.9 kg)     Intake/Output from previous day: 09/12 0701 - 09/13 0700 In: 1250 [P.O.:600; IV Piggyback:650] Out: 4225 [Urine:4075; Chest Tube:150]  Intake/Output this shift: Total I/O In: 50 [IV Piggyback:50] Out: -   Current Meds: Scheduled Meds:   . acetaminophen  650 mg Oral Once  . atorvastatin  20 mg Oral q1800  . bisacodyl  10 mg Oral Daily   Or  . bisacodyl  10 mg Rectal Daily  . budesonide  0.5 mg Nebulization BID  . docusate sodium  200 mg Oral Daily  . feeding supplement  30 mL Oral TID WC  . feeding supplement  1 Container Oral TID BM  . fluticasone  2 spray Each Nare Daily  . insulin aspart  0-24 Units Subcutaneous TID AC & HS  . lactulose  20 g Oral Once  . levalbuterol  1.25 mg Nebulization TID  . metoprolol tartrate  12.5 mg Oral Once  . metoprolol tartrate  12.5 mg Oral BID  . montelukast  10 mg Oral Daily  . nicotine  21 mg Transdermal Daily  . pantoprazole  40 mg Oral Q1200  . potassium chloride  10 mEq Intravenous Q1 Hr x 3  . potassium  chloride  10 mEq Intravenous Once  . potassium chloride  10 mEq Intravenous Q1 Hr x 3  . potassium chloride  20 mEq Oral Daily  . sodium chloride  10-40 mL Intracatheter Q12H  . sodium chloride  3 mL Intravenous Q12H  . vancomycin  1,000 mg Intravenous Q12H   PRN Meds:.HYDROcodone-acetaminophen, influenza  inactive virus vaccine, metoprolol, morphine injection, ondansetron (ZOFRAN) IV, sodium chloride, traMADol, DISCONTD: sodium chloride  General appearance: alert and appears stated age Cardiovascular: RRR Pulmonary: Mostly clear;no rales, wheezes, or rhonchi. Abdomen: Soft, non tender, bowel sounds present Extremities:SCDs in place Wounds:Clean and dry.  Lab Results: CBC: Basename 02/24/12 0410 02/23/12 0415  WBC 7.9 7.9  HGB 8.5* 8.9*  HCT 24.9* 26.2*  PLT 92* 59*   BMET:  Basename 02/25/12 0355 02/24/12 0410  NA 139 136  K 3.2* 3.3*  CL 99 99  CO2 29 29  GLUCOSE 110* 117*  BUN 21 20  CREATININE 0.78 0.69  CALCIUM 8.6 8.3*    PT/INR: No results found for this basename: LABPROT,INR in  the last 72 hours Radiology: Dg Chest Port 1 View  02/24/2012  *RADIOLOGY REPORT*  Clinical Data: Post open heart surgery  PORTABLE CHEST - 1 VIEW  Comparison: 02/23/2012  Findings: Cardiomegaly again noted.  Status post median sternotomy. Mediastinal drain again noted.  Stable small left pleural effusion with left lower lobe atelectasis or infiltrate.  There is  left arm PICC line with tip in SVC.  No pulmonary edema.  IMPRESSION: Status post median sternotomy.  Mediastinal drain again noted. Stable small left pleural effusion with left lower lobe atelectasis or infiltrate.  There is  left arm PICC line with tip in SVC.  No pulmonary edema.   Original Report Authenticated By: Natasha Mead, M.D.      Assessment/Plan: S/P Procedure(s) (LRB): THORACIC ASCENDING ANEURYSM REPAIR (AAA) (N/A) 1. CV - SR. Continue Lopressor 12.5 bid  2. Pulmonary - Encourage incentive spirometer.Chest tube output  around 190 cc.Hopefully, remove in am.Will stop Vancomycin when chest tube removed. 3.Mild volume overload 4. Acute blood loss anemia - H and H this am 8.5 and 24.9  5.Thrombocytopenia-Last platelets increased to 92,000.  6.Supplement potassium.  7.Continue with PT. 8.Miralax for constipation 9.Hopefully, to rehab early next week.   Doree Fudge PA-C 02/25/2012 9:32 AM   Patient seen and examined. Agree with above. Still some confusion- a little worse today per his wife

## 2012-02-25 NOTE — Op Note (Signed)
[name], MIYOSHI NO.:  [id number]  MEDICAL RECORD NO.:  [id number]  LOCATION:  2306                         FACILITY:  [facility]  PHYSICIAN:  Kerin Perna, M.D.  DATE OF BIRTH:  Oct 31, 1950  DATE OF PROCEDURE:  02/20/2012 DATE OF DISCHARGE:                              OPERATIVE REPORT   OPERATION: 1. Repair of type A thoracic aortic dissection with replacement of the     ascending aorta and hemi-arch reconstruction of the aortic arch     using a 30 mm Dacron graft for the ascending aorta and a 32 mm     Dacron graft for the hemi-arch reconstruction. 2. Resuspension of the aortic valve. 3. Right axillary artery cannulation with continuous antegrade     cerebral perfusion during hypothermic circulatory arrest.  SURGEON:  Kerin Perna, M.D.  ASSISTANT:  Coral Ceo, PA-C.  ANESTHESIA:  General by Dr. Bedelia Person.  INDICATIONS:  The patient is a 61 year old Caucasian male who presented to the emergency department with transient neurologic symptoms followed by chest pain.  Initial head CT scan was negative and pulmonary embolus was ruled out with a noncontrast CT scan and his D-dimer was greater than 20, so a CT angiogram of the aorta was performed showing a type A dissection.  I examined the patient in the emergency department and discussed the results of the CT scan findings.  This showed a false lumen extending from the sinotubular junction all way to the iliac bifurcation.  Of note, there was a 6-cm calcified saccular infrarenal abdominal aneurysm as well.  On exam, the patient had evidence of aortic insufficiency.  I discussed the procedure repair of ascending type A aortic dissection using circulatory arrest to the patient and his friend, [name].  I discussed the indications, expected benefits of repair of type A dissection, and the alternatives.  He understood that this was an emergency operation and that there was a very high mortality in  the immediate few hours after the onset of dissection.  He understood the potential risks to include stroke, bleeding, blood transfusion, MI, multisystem failure, and death.  He agreed to proceed under what I felt was an informed consent.  OPERATIVE FINDINGS: 1. Severe dissection of the entire ascending aorta with the origin     from an atherosclerotic penetrating ulcer in the right coronary     sinus close to the right coronary ostium. 2. Severe AI successfully treated with resuspension of the native     aortic valve. 3. Intraoperative coagulopathy with platelet count of 40,000,     requiring blood product transfusion including platelets and a half     dose of Factor VII.  OPERATIVE PROCEDURE:  The patient was brought directly to the operating room from the emergency department where general anesthesia was induced. He remained hemodynamically stable.  Transesophageal echo confirmed the diagnosis of dissection with moderate-to-severe aortic insufficiency. The patient was prepped and draped as a sterile field.  A proper time- out was performed.  First incision was made in the right deltopectoral groove.  The axillary artery was dissected and encircled with vessel loops.  Next a sternal incision was made and sternum was opened.  The pericardium was opened.  There was nonbloody pericardial effusion.  The pericardium was suspended.  The aorta had a hematoma consistent with acute aortic dissection.  There was no active bleeding.  Attention was then directed to the axillary artery cannulation.  The patient was heparinized.  Two clamps were placed on the axillary artery and an incision was made.  An 8 mm Hemashield graft was sewn end-to-side with running 5-0 Prolene.  This was then back flushed into the aortic line and secured with cable ties.  Next, pursestring was placed in the right atrium and the patient was cannulated.  The patient was placed on cardiopulmonary bypass and a  left ventricular vent was quickly placed through the right superior pulmonary vein.  A retrograde cardioplegia cannula was placed with a pursestring in the right atrium and the cannula was placed into the coronary sinus.  The patient was then cooled.  A tubing for CO2 insufflation and it was secured to the top of the sternal incision at the skin.  A large crossclamp was then placed beneath the innominate artery on the ascending aorta.  Cardioplegia was then delivered via the coronary sinus catheter 1 L and topical hypothermic ice was applied to the pericardium. There was a good cardioplegic arrest.  The aorta was transected beneath the crossclamp.  The false lumen extended all way down to the sinotubular junction.  The origin of the dissection was a penetrating ulcer just above the ostium of the right coronary and the right coronary cusp.  Hand-held cardioplegia was then delivered to both the coronary ostium, approximately 200 mL into each.  Attention was then directed to reconstruct the aorta at the sinotubular junction.  BioGlue was placed in between the intimal flap, and the adventitia and using a Foley balloon, the 2 components of the aortic wall were reapposed.  Next, the aortic valve was re-suspended with 4-0 Prolene pledgeted sutures placed at each commissure and brought through the aortic wall and tied on the outside to another pledget.  The valve leaflets were inspected and found to have good coaptation and good apposition.  Next, reconstruction of the proximal aorta was performed with a straight graft of 30 mm Hemashield.  This was sewn to the sinotubular junction above the coronary ostium using a running 4-0 Prolene.  Interrupted 4-0 pledgeted sutures were placed around the inside of the anastomosis posteriorly to reinforce this.  The anastomosis was then completed with a running suture anteriorly with care being taken not to involve the closure to compromise the right  coronary ostium.  Interrupted 4-0 pledgeted Prolene sutures were then placed around the anterior aspect of the anastomosis on the outside to reinforce this for hemostasis.  The patient had been cooled during this period of time and it is now down to 18 degrees.  We started the circulatory arrest after draining the patient of blood to the pump and removing the crossclamp.  The distal aortic reconstruction was then performed.  The innominate artery appeared to be scarred and difficult to clamp for antegrade reperfusion, so initially retrograde cerebral perfusion was performed using a catheter up the SVC.  However, further dissection around the innominate artery allowed a vascular clamp be placed right at its origin, so that successful antegrade cerebral perfusion through the right axillary cannula was transitioned after approximately 15-20 minutes of circ arrest.  The circ arrest repair of the distal anastomosis was 1st attempted using the original graft of 30 mm Hemashield and an anastomosis to the aorta.  The intimal flap was glued to the adventitia using biologic glue and Foley catheter similar to the proximal suture line.  The 30 mm graft was then sewn end-to-end using a running 4-0 Prolene with reinforced interrupted 4-0 Prolene pledgeted sutures on the inside of the posterior suture line.  After this was completed, it was noted that the sutures had torn through the native aorta at the proximal arch so the anastomosis was redone and after trimming the area of the torn proximal arch.  Second anastomosis resulted in the same problem that the sutures would tear through the aorta.  Antegrade cerebral perfusion was being administered during this time.  It was decided to resect the aorta fairly proximally to do hemi-arch reconstruction with a separate graft and a 32 mm Hemashield graft was selected.  The aortic wall was strengthened by using Teflon felt sandwiched between the intima and  the adventitia and then treated with biologic glue.  This provided a more substantial aortic wall to which the graft was sewn.  The new graft was beveled to the appropriate length and angulation for the hemi-arch and this was sewn with a running 4-0 Prolene underneath the arch vessels. The inside of the anastomosis inferiorly was reinforced with interrupted 4-0 pledgeted Prolenes.  Next, the anastomosis was completed anteriorly using a running suture and then reinforced with several interrupted 4-0 pledgeted sutures for hemostasis.  At this point, the patient was perfused by placing a vascular clamp on the arch graft just beneath the suture line and flow was restored to the aortic arch, distal aorta, and all of the vessels off the top of the arch.  While this was being performed, the 2nd graft was trimmed to the appropriate length and angulation and the 2 grafts were sewn end-to-end using running 4-0 Prolene.  The patient was then rewarmed.  This took a considerable period of time due to the patient's large body size with BSA of 2.4.  After the patient was rewarmed, temporary pacing wires were applied.  A vent was placed in the graft to remove any residual air.  The lungs were expanded, the ventilator was resumed.  Low-dose dopamine was started. The patient was then weaned off cardiopulmonary bypass.  The echo showed excellent LV function.  The patient was being AV paced and his blood gas and hemodynamics were stable.  After the protamine, there was still considerable diffuse bleeding.  The patient's platelet count was 40,000. The patient was given platelets and FFP.  This improved coagulation. Some interrupted sutures were placed around the anastomosis of the graft to the arch for hemostasis.  The proximal graft anastomosis of the sinotubular junction was hemostatic.  Attention was then directed to take the cannula out of the axillary artery and this was performed and the graft was  cut leaving a small cuff, which was ligated with heavy silk and then oversewn with 4-0 Prolene.  At this point, there was still considerable diffuse bleeding after giving FFP, platelets, and blood totaling much more than 10 units of blood products, so the patient was given half dose of Factor VII 5 mg IV.  This and additional sutures in the graft-to-graft anastomosis resulted in adequate hemostasis.  An anterior and posterior mediastinal chest tube were placed.  Both pleural spaces were opened and drained. Sternum was then closed with interrupted steel wire.  The patient remained hemodynamically stable.  The pectoralis fascia was closed with running #1 Vicryl.  The subcutaneous and skin layers were closed in running  Vicryl.  Sterile dressings were applied.  Total pump time was 336 minutes.  The total circulatory arrest using antegrade cerebral perfusion was 60 minutes.     Kerin Perna, M.D.     PV/MEDQ  D:  02/24/2012  T:  02/25/2012  Job:  [id number]

## 2012-02-26 ENCOUNTER — Inpatient Hospital Stay (HOSPITAL_COMMUNITY): Payer: 59

## 2012-02-26 LAB — GLUCOSE, CAPILLARY
Glucose-Capillary: 164 mg/dL — ABNORMAL HIGH (ref 70–99)
Glucose-Capillary: 135 mg/dL — ABNORMAL HIGH (ref 70–99)
Glucose-Capillary: 150 mg/dL — ABNORMAL HIGH (ref 70–99)
Glucose-Capillary: 146 mg/dL — ABNORMAL HIGH (ref 70–99)
Glucose-Capillary: 160 mg/dL — ABNORMAL HIGH (ref 70–99)
Glucose-Capillary: 239 mg/dL — ABNORMAL HIGH (ref 70–99)

## 2012-02-26 LAB — BASIC METABOLIC PANEL
BUN: 23 mg/dL (ref 6–23)
Creatinine, Ser: 0.85 mg/dL (ref 0.50–1.35)
GFR calc Af Amer: >90 mL/min (ref >90)
GFR calc non Af Amer: >90 mL/min (ref >90)
Potassium: 3.8 mEq/L (ref 3.5–5.1)

## 2012-02-26 LAB — CBC
Hemoglobin: 9.1 g/dL — ABNORMAL LOW (ref 13.0–17.0)
MCHC: 33.5 g/dL (ref 30.0–36.0)
Platelets: 123 10*3/uL — ABNORMAL LOW (ref 150–400)
RDW: 14.6 % (ref 11.5–15.5)

## 2012-02-26 NOTE — Progress Notes (Signed)
More cooperative today but still some issues with impulse control  Ambulating well

## 2012-02-26 NOTE — Progress Notes (Signed)
Order to remove patient chest tube prior to morning chest x-ray. Supplies collected and patient prepped. Patient demonstrated procedure of holding breath and bearing down while tube was to be pulled out. Using sterile technique chest tube suture was cut and betadine applied to area. Patient instructed to hold breath and chest tube was removed at peak inspiration with patient holding their breath. Vaseline gauze applied to area and sutures tied. Area is clean and dry, patient tolerated well. Will continue to Monitor.

## 2012-02-26 NOTE — Progress Notes (Signed)
6 Days Post-Op Procedure(s) (LRB): THORACIC ASCENDING ANEURYSM REPAIR (AAA) (N/A) Subjective: No complaints this AM RNs have noted lack of impulse control  Objective: Vital signs in last 24 hours: Temp:  [96.6 F (35.9 C)-98.6 F (37 C)] 98 F (36.7 C) (09/14 0800) Pulse Rate:  [73-87] 81  (09/14 0800) Cardiac Rhythm:  [-] Normal sinus rhythm (09/14 0800) Resp:  [13-22] 16  (09/14 0800) BP: (109-145)/(54-93) 145/73 mmHg (09/14 0800) SpO2:  [89 %-96 %] 89 % (09/14 0920) Weight:  [234 lb 12.6 oz (106.5 kg)] 234 lb 12.6 oz (106.5 kg) (09/14 0500)  Hemodynamic parameters for last 24 hours:    Intake/Output from previous day: 09/13 0701 - 09/14 0700 In: 1410 [P.O.:700; I.V.:260; IV Piggyback:450] Out: 1130 [Urine:1050; Chest Tube:80] Intake/Output this shift: Total I/O In: 20 [I.V.:20] Out: -   General appearance: alert and no distress Neurologic: intact Heart: regular rate and rhythm Lungs: clear to auscultation bilaterally Abdomen: normal findings: soft, non-tender Wound: clean and dry  Lab Results:  Basename 02/26/12 0422 02/24/12 0410  WBC 10.9* 7.9  HGB 9.1* 8.5*  HCT 27.2* 24.9*  PLT 123* 92*   BMET:  Basename 02/26/12 0422 02/25/12 0355  NA 137 139  K 3.8 3.2*  CL 99 99  CO2 28 29  GLUCOSE 140* 110*  BUN 23 21  CREATININE 0.85 0.78  CALCIUM 8.4 8.6    PT/INR: No results found for this basename: LABPROT,INR in the last 72 hours ABG    Component Value Date/Time   PHART 7.442 02/22/2012 2035   HCO3 24.5* 02/22/2012 2035   TCO2 26 02/22/2012 2035   ACIDBASEDEF 1.0 02/21/2012 1351   O2SAT 86.0 02/22/2012 2035   CBG (last 3)   Basename 02/26/12 0732 02/25/12 2226 02/25/12 1715  GLUCAP 135* 164* 146*    Assessment/Plan: S/P Procedure(s) (LRB): THORACIC ASCENDING ANEURYSM REPAIR (AAA) (N/A) Repair Type I dissection NEURO- no focal deficits, continues to exhibit lack of impulse control, tries to get OOB without assistance  CV- stable  RESP-  stable  RENAL- hypokalemia improving, creatinine OK  Thrombocytopenia improving  Keep in SICU for safety reasons   LOS: 7 days    Aseel Uhde C 02/26/2012

## 2012-02-27 LAB — GLUCOSE, CAPILLARY
Glucose-Capillary: 145 mg/dL — ABNORMAL HIGH (ref 70–99)
Glucose-Capillary: 147 mg/dL — ABNORMAL HIGH (ref 70–99)
Glucose-Capillary: 153 mg/dL — ABNORMAL HIGH (ref 70–99)
Glucose-Capillary: 135 mg/dL — ABNORMAL HIGH (ref 70–99)

## 2012-02-27 MED ORDER — METOPROLOL TARTRATE 25 MG PO TABS
25.0000 mg | ORAL_TABLET | Freq: Two times a day (BID) | ORAL | Status: DC
  Administered 2012-02-27 – 2012-02-29 (×4): 25 mg via ORAL
  Filled 2012-02-27 (×6): qty 1

## 2012-02-27 MED ORDER — INSULIN GLARGINE 100 UNIT/ML ~~LOC~~ SOLN
30.0000 [IU] | Freq: Every day | SUBCUTANEOUS | Status: DC
  Administered 2012-02-27 – 2012-02-28 (×2): 30 [IU] via SUBCUTANEOUS

## 2012-02-27 MED ORDER — LEVALBUTEROL HCL 1.25 MG/0.5ML IN NEBU
1.2500 mg | INHALATION_SOLUTION | Freq: Four times a day (QID) | RESPIRATORY_TRACT | Status: DC | PRN
  Filled 2012-02-27: qty 0.5

## 2012-02-27 NOTE — Progress Notes (Signed)
Resting comfortably now BP 129/77  Pulse 72  Temp 98.6 F (37 C) (Oral)  Resp 21  Ht 6\' 4"  (1.93 m)  Wt 230 lb 2.6 oz (104.4 kg)  BMI 28.02 kg/m2  SpO2 95%  Intake/Output Summary (Last 24 hours) at 02/27/12 1948 Last data filed at 02/27/12 1400  Gross per 24 hour  Intake    720 ml  Output   1165 ml  Net   -445 ml   Impulse control remains an issue

## 2012-02-27 NOTE — Progress Notes (Signed)
7 Days Post-Op Procedure(s) (LRB): THORACIC ASCENDING ANEURYSM REPAIR (AAA) (N/A) Subjective: No complaints Alert and conversational, but still some confusion and lack of impulse control  Objective: Vital signs in last 24 hours: Temp:  [98.3 F (36.8 C)-99.5 F (37.5 C)] 98.7 F (37.1 C) (09/15 0734) Pulse Rate:  [45-92] 88  (09/15 0800) Cardiac Rhythm:  [-] Normal sinus rhythm (09/15 0800) Resp:  [12-26] 26  (09/15 0800) BP: (122-152)/(59-86) 124/69 mmHg (09/15 0800) SpO2:  [89 %-99 %] 99 % (09/15 0800) Weight:  [230 lb 2.6 oz (104.4 kg)] 230 lb 2.6 oz (104.4 kg) (09/15 0500)  Hemodynamic parameters for last 24 hours:    Intake/Output from previous day: 09/14 0701 - 09/15 0700 In: 1275 [P.O.:1015; I.V.:60; IV Piggyback:200] Out: 1990 [Urine:1990] Intake/Output this shift:    General appearance: alert and no distress Neurologic: see above Heart: regular rate and rhythm Lungs: clear to auscultation bilaterally Wound: clean and dry  Lab Results:  Basename 02/26/12 0422  WBC 10.9*  HGB 9.1*  HCT 27.2*  PLT 123*   BMET:  Basename 02/26/12 0422 02/25/12 0355  NA 137 139  K 3.8 3.2*  CL 99 99  CO2 28 29  GLUCOSE 140* 110*  BUN 23 21  CREATININE 0.85 0.78  CALCIUM 8.4 8.6    PT/INR: No results found for this basename: LABPROT,INR in the last 72 hours ABG    Component Value Date/Time   PHART 7.442 02/22/2012 2035   HCO3 24.5* 02/22/2012 2035   TCO2 26 02/22/2012 2035   ACIDBASEDEF 1.0 02/21/2012 1351   O2SAT 86.0 02/22/2012 2035   CBG (last 3)   Basename 02/27/12 0732 02/26/12 2139 02/26/12 1953  GLUCAP 145* 239* 160*    Assessment/Plan: S/P Procedure(s) (LRB): THORACIC ASCENDING ANEURYSM REPAIR (AAA) (N/A) - CV- stable, will increase lopressor to 25 BID  RESP- stable, continue IS  RENAL- lytes, creatinine OK  CBG persistently high- lantus ordered  Keep in SICU due to issues with impulse control/ safety    LOS: 8 days    [name]  C 02/27/2012

## 2012-02-28 ENCOUNTER — Inpatient Hospital Stay (HOSPITAL_COMMUNITY): Payer: 59

## 2012-02-28 LAB — BASIC METABOLIC PANEL
BUN: 24 mg/dL — ABNORMAL HIGH (ref 6–23)
Chloride: 101 mEq/L (ref 96–112)
Glucose, Bld: 135 mg/dL — ABNORMAL HIGH (ref 70–99)
Potassium: 3.8 mEq/L (ref 3.5–5.1)

## 2012-02-28 LAB — GLUCOSE, CAPILLARY
Glucose-Capillary: 127 mg/dL — ABNORMAL HIGH (ref 70–99)
Glucose-Capillary: 128 mg/dL — ABNORMAL HIGH (ref 70–99)
Glucose-Capillary: 173 mg/dL — ABNORMAL HIGH (ref 70–99)
Glucose-Capillary: 142 mg/dL — ABNORMAL HIGH (ref 70–99)

## 2012-02-28 LAB — CBC
HCT: 28.6 % — ABNORMAL LOW (ref 39.0–52.0)
Hemoglobin: 9.6 g/dL — ABNORMAL LOW (ref 13.0–17.0)
RBC: 3.1 MIL/uL — ABNORMAL LOW (ref 4.22–5.81)
WBC: 12.9 10*3/uL — ABNORMAL HIGH (ref 4.0–10.5)

## 2012-02-28 MED ORDER — ASPIRIN 81 MG PO CHEW
81.0000 mg | CHEWABLE_TABLET | Freq: Every day | ORAL | Status: DC
  Administered 2012-02-28 – 2012-02-29 (×2): 81 mg via ORAL
  Filled 2012-02-28 (×2): qty 1

## 2012-02-28 MED ORDER — BOOST / RESOURCE BREEZE PO LIQD
1.0000 | Freq: Two times a day (BID) | ORAL | Status: DC
  Administered 2012-02-29 (×2): 1 via ORAL

## 2012-02-28 MED ORDER — SODIUM CHLORIDE 0.9 % IJ SOLN: 3.0000 mL | INTRAMUSCULAR | Status: DC | PRN

## 2012-02-28 MED ORDER — INSULIN GLARGINE 100 UNIT/ML ~~LOC~~ SOLN
24.0000 [IU] | Freq: Every day | SUBCUTANEOUS | Status: DC
  Administered 2012-02-29: 24 [IU] via SUBCUTANEOUS

## 2012-02-28 MED ORDER — SODIUM CHLORIDE 0.9 % IJ SOLN: 3.0000 mL | Freq: Two times a day (BID) | INTRAMUSCULAR | Status: DC

## 2012-02-28 MED ORDER — SODIUM CHLORIDE 0.9 % IV SOLN: 250.0000 mL | INTRAVENOUS | Status: DC | PRN

## 2012-02-28 MED ORDER — PRO-STAT SUGAR FREE PO LIQD: 30.0000 mL | Freq: Every day | ORAL | Status: DC

## 2012-02-28 MED ORDER — MOVING RIGHT ALONG BOOK
Freq: Once | Status: AC
  Administered 2012-02-29: 14:00:00
  Filled 2012-02-28 (×2): qty 1

## 2012-02-28 NOTE — Progress Notes (Signed)
Physical Therapy Treatment Patient Details Name: [name] MRN: [id number] DOB: October 06, 1950 Today's Date: 02/28/2012 Time: 4098-1191 PT Time Calculation (min): 23 min  PT Assessment / Plan / Recommendation Comments on Treatment Session  Pt adm for emergent TAA repair.  Pt more alert and conversant today.  Mobility improving.    Follow Up Recommendations  Inpatient Rehab    Barriers to Discharge        Equipment Recommendations  Rolling walker with 5" wheels;3 in 1 bedside comode    Recommendations for Other Services    Frequency Min 3X/week   Plan Discharge plan remains appropriate;Frequency remains appropriate    Precautions / Restrictions Precautions Precautions: Fall   Pertinent Vitals/Pain VSS    Mobility  Bed Mobility Bed Mobility: Left Sidelying to Sit Left Sidelying to Sit: 3: Mod assist;HOB flat Details for Bed Mobility Assistance: assist to bring trunk up Transfers Sit to Stand: 3: Mod assist;Without upper extremity assist;From elevated surface;From bed Stand to Sit: 4: Min assist;Without upper extremity assist;To chair/3-in-1 Details for Transfer Assistance: Verbal cues to place hands on knees to decr use of arms for sternal precautions. Ambulation/Gait Ambulation/Gait Assistance: 4: Min assist Ambulation Distance (Feet): 150 Feet Assistive device: Rolling walker Ambulation/Gait Assistance Details: verbal cues to stand erect Gait Pattern: Step-through pattern;Decreased stride length;Trunk flexed Gait velocity: decr    Exercises     PT Diagnosis:    PT Problem List:   PT Treatment Interventions:     PT Goals Acute Rehab PT Goals PT Goal: Supine/Side to Sit - Progress: Progressing toward goal PT Goal: Sit to Stand - Progress: Progressing toward goal PT Goal: Stand to Sit - Progress: Progressing toward goal PT Goal: Ambulate - Progress: Progressing toward goal  Visit Information  Last PT Received On: 02/28/12 Assistance Needed: +1      Subjective Data  Subjective: "You've been here a long time too," pt stated when I asked him how long he had worked here.   Cognition  Overall Cognitive Status: Appears within functional limits for tasks assessed/performed Arousal/Alertness: Awake/alert Orientation Level: Appears intact for tasks assessed Behavior During Session: [name] for tasks performed    Balance  Static Standing Balance Static Standing - Balance Support: Bilateral upper extremity supported (on walker) Static Standing - Level of Assistance: 5: Stand by assistance  End of Session PT - End of Session Equipment Utilized During Treatment: Gait belt Activity Tolerance: Patient tolerated treatment well Patient left: in chair;with call bell/phone within reach Nurse Communication: Mobility status   GP     [name] 02/28/2012, 12:34 PM  [name] PT [phone]

## 2012-02-28 NOTE — Progress Notes (Signed)
Pt requesting to go back to bed due lower back pain.  Assisted pt to bed.  Will continue to monitor.

## 2012-02-28 NOTE — Progress Notes (Signed)
Nutrition Follow-up  Intervention:    Resource Breeze 2 times daily between meals (250 kcals, 9 gm protein per 8 fl oz carton)  Discontinue Prostat liquid protein 3 times daily with meals  RD to follow for nutrition care plan  Assessment:   S/p bedside swallow evaluation 9/11 -- patient presented with functional swallow. Patient with much improved altertness. Advanced to Heart Healthy diet 9/12. Patient reports a good appetite. PO intake 100% per flowsheet records. He is drinking Nurse, adult supplements.  Diet Order:  Heart Healthy  Meds: Scheduled Meds:   . atorvastatin  20 mg Oral q1800  . bisacodyl  10 mg Oral Daily   Or  . bisacodyl  10 mg Rectal Daily  . budesonide  0.5 mg Nebulization BID  . docusate sodium  200 mg Oral Daily  . feeding supplement  30 mL Oral TID WC  . feeding supplement  1 Container Oral TID BM  . fluticasone  2 spray Each Nare Daily  . insulin aspart  0-24 Units Subcutaneous TID AC & HS  . insulin glargine  30 Units Subcutaneous Daily  . metoprolol tartrate  25 mg Oral BID  . montelukast  10 mg Oral Daily  . nicotine  21 mg Transdermal Daily  . pantoprazole  40 mg Oral Q1200  . potassium chloride  20 mEq Oral Daily  . sodium chloride  10-40 mL Intracatheter Q12H  . sodium chloride  3 mL Intravenous Q12H   Continuous Infusions:   . DISCONTD: sodium chloride Stopped (02/24/12 0000)   PRN Meds:.HYDROcodone-acetaminophen, influenza  inactive virus vaccine, metoprolol, ondansetron (ZOFRAN) IV, sodium chloride, traMADol, DISCONTD: levalbuterol, DISCONTD:  morphine injection  Labs:  CMP     Component Value Date/Time   NA 135 02/28/2012 0420   K 3.8 02/28/2012 0420   CL 101 02/28/2012 0420   CO2 22 02/28/2012 0420   GLUCOSE 135* 02/28/2012 0420   BUN 24* 02/28/2012 0420   CREATININE 0.74 02/28/2012 0420   CALCIUM 8.5 02/28/2012 0420   PROT 5.4* 02/24/2012 0410   ALBUMIN 2.8* 02/24/2012 0410   AST 99* 02/24/2012 0410   ALT 185* 02/24/2012 0410   ALKPHOS 34* 02/24/2012 0410   BILITOT 1.1 02/24/2012 0410   GFRNONAA >90 02/28/2012 0420   GFRAA >90 02/28/2012 0420     Intake/Output Summary (Last 24 hours) at 02/28/12 1047 Last data filed at 02/28/12 0929  Gross per 24 hour  Intake    480 ml  Output   1000 ml  Net   -520 ml    CBG (last 3)   Basename 02/28/12 0739 02/27/12 2156 02/27/12 1659  GLUCAP 127* 135* 153*    Weight Status:  103.1 kg (9/16) -- fluctuating   Re-estimated needs:  2200-2400 kcals, 120-130 gm protein  New Nutrition Dx: Increased Nutrient Needs r/t post-op healing as evidenced by estimated nutrition needs, ongoing  Goal:  Oral intake with meals & supplements to meet >/= 90% of estimated nutrition needs, met  Monitor:  PO & supplemental intake, weight, labs, I/O's  Kirkland Hun, RD, LDN Pager #: [phone] After-Hours Pager #: [phone]

## 2012-02-28 NOTE — Progress Notes (Signed)
Occupational Therapy Treatment Patient Details Name: [name] MRN: [id number] DOB: 21-Nov-1950 Today's Date: 02/28/2012 Time: 1350-1420 OT Time Calculation (min): 30 min  OT Assessment / Plan / Recommendation Comments on Treatment Session Pt progressing steadily in ADL and mobility. Continues to have memory issues and confusion intermittently.    Follow Up Recommendations  Inpatient Rehab    Barriers to Discharge       Equipment Recommendations  Rolling walker with 5" wheels;3 in 1 bedside comode    Recommendations for Other Services    Frequency Min 2X/week   Plan Discharge plan remains appropriate    Precautions / Restrictions Precautions Precautions: Fall;Sternal Precaution Comments: Pt not able to state sternal precautions, reeducated. Restrictions Weight Bearing Restrictions: No   Pertinent Vitals/Pain     ADL  Grooming: Performed;Wash/dry hands;Minimal assistance Where Assessed - Grooming: Supported standing Lower Body Dressing: Performed;Minimal assistance Where Assessed - Lower Body Dressing: Supported sit to Pharmacist, hospital: Performed;Minimal Dentist Method: Sit to Barista: Materials engineer and Hygiene: Performed;+1 Total assistance Where Assessed - Engineer, mining and Hygiene: Sit to stand from 3-in-1 or toilet Equipment Used: Rolling walker;Gait belt Transfers/Ambulation Related to ADLs: min assist, verbal cues for technique  ADL Comments: Instructed pt in sternal precautions related to ADL.    OT Diagnosis:    OT Problem List:   OT Treatment Interventions:     OT Goals ADL Goals Pt Will Perform Grooming: Standing at sink;with min assist ADL Goal: Grooming - Progress: Progressing toward goals Pt Will Perform Upper Body Bathing: with set-up;Sitting, edge of bed Pt Will Perform Lower Body Bathing: with min assist;Sit to stand from bed Pt Will Perform  Upper Body Dressing: Sitting, bed;with set-up Pt Will Perform Lower Body Dressing: with min assist;Sit to stand from bed ADL Goal: Lower Body Dressing - Progress: Progressing toward goals Pt Will Transfer to Toilet: Ambulation;3-in-1;with DME;with min assist ADL Goal: Toilet Transfer - Progress: Met Pt Will Perform Toileting - Clothing Manipulation: with min assist;Standing ADL Goal: Toileting - Clothing Manipulation - Progress: Progressing toward goals Pt Will Perform Toileting - Hygiene: with min assist;Sit to stand from 3-in-1/toilet ADL Goal: Toileting - Hygiene - Progress: Progressing toward goals Miscellaneous OT Goals Miscellaneous OT Goal #1: Pt will adhere to sternal precautions during ADL. OT Goal: Miscellaneous Goal #1 - Progress: Not met  Visit Information  Last OT Received On: 02/28/12 Assistance Needed: +1    Subjective Data      Prior Functioning       Cognition  Overall Cognitive Status: Impaired Area of Impairment: Memory Arousal/Alertness: Awake/alert Orientation Level: Disoriented to;Time Behavior During Session: WFL for tasks performed Memory: Decreased recall of precautions Cognition - Other Comments: pt with some mild confusion    Mobility  Shoulder Instructions Bed Mobility Bed Mobility: Not assessed Left Sidelying to Sit: 3: Mod assist;HOB flat Details for Bed Mobility Assistance: assist to bring trunk up Transfers Transfers: Sit to Stand;Stand to Sit Sit to Stand: 4: Min assist;From chair/3-in-1 Stand to Sit: 4: Min assist;Without upper extremity assist;To chair/3-in-1 Details for Transfer Assistance: Verbal cues to place hands on knees to decr use of arms for sternal precautions.       Exercises      Balance Static Standing Balance Static Standing - Balance Support: Bilateral upper extremity supported (on walker) Static Standing - Level of Assistance: 5: Stand by assistance   End of Session OT - End of Session Activity Tolerance: Patient  tolerated treatment well Patient left: in chair;with call bell/phone within reach;with family/visitor present  GO     [name] 02/28/2012, 2:45 PM [phone]

## 2012-02-28 NOTE — Progress Notes (Signed)
Follow-up visit with pt today. Pt did not remember our conversation last week about coming to CIR. Reviewed information on CIR, pt in agreement with plan to come to rehab when stable. Met with pt's daughter, Diannia Ruder late Friday afternoon and toured the rehab center. She reported that she plans to take FMLA when pt discharges home to provide care. Please call with questions: [phone].

## 2012-02-28 NOTE — Plan of Care (Signed)
Problem: Phase III Progression Outcomes Goal: Time patient transferred to PCTU/Telemetry POD Outcome: Completed/Met Date Met:  02/28/12 1545

## 2012-02-28 NOTE — Progress Notes (Signed)
8 Days Post-Op Procedure(s) (LRB): THORACIC ASCENDING ANEURYSM REPAIR (AAA) (N/A) Subjective: NSR Mental status improved Incisions clean and dry  Objective: Vital signs in last 24 hours: Temp:  [98.3 F (36.8 C)-98.6 F (37 C)] 98.3 F (36.8 C) (09/16 0733) Pulse Rate:  [71-91] 71  (09/16 0700) Cardiac Rhythm:  [-] Normal sinus rhythm (09/16 0400) Resp:  [10-22] 18  (09/16 0700) BP: (98-149)/(49-83) 116/60 mmHg (09/16 0700) SpO2:  [90 %-99 %] 99 % (09/16 0700) Weight:  [227 lb 4.7 oz (103.1 kg)] 227 lb 4.7 oz (103.1 kg) (09/16 0500)  Hemodynamic parameters for last 24 hours:  stable  Intake/Output from previous day: 09/15 0701 - 09/16 0700 In: 480 [P.O.:480] Out: 1150 [Urine:1150] Intake/Output this shift:    No murmur Pulses intact  Lab Results:  Basename 02/28/12 0420 02/26/12 0422  WBC 12.9* 10.9*  HGB 9.6* 9.1*  HCT 28.6* 27.2*  PLT 149* 123*   BMET:  Basename 02/28/12 0420 02/26/12 0422  NA 135 137  K 3.8 3.8  CL 101 99  CO2 22 28  GLUCOSE 135* 140*  BUN 24* 23  CREATININE 0.74 0.85  CALCIUM 8.5 8.4    PT/INR: No results found for this basename: LABPROT,INR in the last 72 hours ABG    Component Value Date/Time   PHART 7.442 02/22/2012 2035   HCO3 24.5* 02/22/2012 2035   TCO2 26 02/22/2012 2035   ACIDBASEDEF 1.0 02/21/2012 1351   O2SAT 86.0 02/22/2012 2035   CBG (last 3)   Basename 02/28/12 0739 02/27/12 2156 02/27/12 1659  GLUCAP 127* 135* 153*    Assessment/Plan: S/P Procedure(s) (LRB): THORACIC ASCENDING ANEURYSM REPAIR (AAA) (N/A) Plan for transfer to step-down: see transfer orders Will be ready for CIR soon Get EPWs out  tues   LOS: 9 days    VAN TRIGT III,Andie Mungin 02/28/2012

## 2012-02-28 NOTE — PMR Pre-admission (Signed)
PMR Admission Coordinator Pre-Admission Assessment  Patient: [name] is an 61 y.o., male MRN: [id number] DOB: 1950/11/07 Height: 6\' 4"  (193 cm) Weight: 103.1 kg (227 lb 4.7 oz)  Insurance Information OTHER:POS PRIMARY: UMR      Policy#: [id number]      Subscriber: self CM Name: Marylene Land      Phone#: [phone]     Fax#: [phone]    Update due 1/32/44 Pre-Cert#: [id number]      Employer: [employer] Benefits:  Phone #: [phone]     Name: self Eff. Date: 06/14/09     Deduct: $400 (0 met)      Out of Pocket Max: $3000      Life Max: none CIR: 90%/10%      SNF: 120 days 90%10% Outpatient: 100%     Co-Pay: $20 Home Health: $30 copay per visit     (until o-o-p max met, then 100%)      DME: 80%     Co-Pay: 20% Providers: in network  Emergency Contact Information Contact Information    Name Relation Home Work Mobile   Penry,Melinda  [place] Millard Bautch Significant other  Daughter Daughter [phone]    [phone]          Current Medical History  Patient Admitting Diagnosis: Thoracic aortic aneurysm s/p repair, encephalopathy  History of Present Illness: 61 y.o. right-handed male with obstructive sleep apnea. 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 Admitted 02/20/2012 with transient symptoms of headache difficulty with speech and leg weakness. Head CT scan was performed that was negative. He started having chest pain and a CT scan was performed rule out pulmonary embolism. A second CTA of the thoracic and abdominal aorta demonstrated a type A dissection with the origin and the asymmetry aorta above the aortic valve continuing down just above the aortic bifurcation. Underwent emergent repair of thoracic aortic dissection 02/20/2012 per Dr. Donata Clay. Noted bouts of hypoxia patient refusing oxygen at times. Followup speech therapy questionable dysphagia currently maintained on a clear liquid diet.  Postoperative anemia 7.5-8.9 and monitored. A mediastinal chest tube has been in place and vancomycin was initiated prophylactically. Pacing wires removed 02/29/2012. Patient with noted bouts of confusion as well his recall of his hospitalizations events suspect encephalopathy.    Total: 0 =NIH   Past Medical History  Past Medical History  Diagnosis Date  . OBSTRUCTIVE SLEEP APNEA 02/2008 sleep study  . MIGRAINE HEADACHE   . ERECTILE DYSFUNCTION, ORGANIC   . Unspecified vitamin D deficiency   . HYPERLIPIDEMIA   . ASTHMA   . ALLERGIC RHINITIS     Family History  Family history includes Diabetes in his mother; Heart disease in his cousin, maternal uncle, and paternal uncle; and Lung cancer in his cousin.  Prior Rehab/Hospitalizations: None  Current Medications  Current facility-administered medications:atorvastatin (LIPITOR) tablet 20 mg, 20 mg, Oral, q1800, Wilmon Pali, PA, 20 mg at 02/26/12 1812;  bisacodyl (DULCOLAX) EC tablet 10 mg, 10 mg, Oral, Daily, Wilmon Pali, PA, 10 mg at 02/28/12 0931;  bisacodyl (DULCOLAX) suppository 10 mg, 10 mg, Rectal, Daily, Wilmon Pali, PA;  budesonide (PULMICORT) nebulizer solution 0.5 mg, 0.5 mg, Nebulization, BID, Kerin Perna, MD, 0.5 mg at 02/28/12 6301 docusate sodium (COLACE) capsule 200 mg, 200 mg, Oral, Daily, Wilmon Pali, PA, 200 mg at 02/28/12 0930;  feeding supplement (RESOURCE BREEZE) liquid 1 Container, 1 Container, Oral, BID BM, Ailene Ards, RD;  fluticasone (FLONASE) 50 MCG/ACT nasal spray 2 spray, 2 spray, Each Nare, Daily, Wilmon Pali, PA, 2 spray at 02/28/12 0933 HYDROcodone-acetaminophen (NORCO/VICODIN) 5-325 MG per tablet 1 tablet, 1 tablet, Oral, Q4H PRN, Kerin Perna, MD, 1 tablet at 02/28/12 0631;  influenza  inactive virus vaccine (FLUZONE/FLUARIX) injection 0.5 mL, 0.5 mL, Intramuscular, Prior to discharge, Kerin Perna, MD;  insulin aspart (novoLOG) injection 0-24 Units, 0-24 Units, Subcutaneous, TID  AC & HS, Kerin Perna, MD, 2 Units at 02/28/12 0747 insulin glargine (LANTUS) injection 30 Units, 30 Units, Subcutaneous, Daily, Loreli Slot, MD, 30 Units at 02/28/12 0932;  metoprolol (LOPRESSOR) injection 2.5-5 mg, 2.5-5 mg, Intravenous, Q2H PRN, Wilmon Pali, PA;  metoprolol tartrate (LOPRESSOR) tablet 25 mg, 25 mg, Oral, BID, Loreli Slot, MD, 25 mg at 02/28/12 0932;  montelukast (SINGULAIR) tablet 10 mg, 10 mg, Oral, Daily, Wilmon Pali, PA, 10 mg at 02/28/12 0931 nicotine (NICODERM CQ - dosed in mg/24 hours) patch 21 mg, 21 mg, Transdermal, Daily, Kerin Perna, MD;  ondansetron [facility]) injection 4 mg, 4 mg, Intravenous, Q6H PRN, Wilmon Pali, PA, 4 mg at 02/22/12 0407;  pantoprazole (PROTONIX) EC tablet 40 mg, 40 mg, Oral, Q1200, Wilmon Pali, PA, 40 mg at 02/27/12 1216;  potassium chloride SA (K-DUR,KLOR-CON) CR tablet 20 mEq, 20 mEq, Oral, Daily, Ardelle Balls, PA, 20 mEq at 02/28/12 0931 sodium chloride 0.9 % injection 10-40 mL, 10-40 mL, Intracatheter, Q12H, Kerin Perna, MD, 10 mL at 02/28/12 0933;  sodium chloride 0.9 % injection 10-40 mL, 10-40 mL, Intracatheter, PRN, Kerin Perna, MD;  sodium chloride 0.9 % injection 3 mL, 3 mL, Intravenous, Q12H, Wilmon Pali, PA, 3 mL at 02/27/12 1000;  traMADol (ULTRAM) tablet 50-100 mg, 50-100 mg, Oral, Q6H PRN, Kerin Perna, MD, 50 mg at 02/23/12 1153 DISCONTD: 0.9 %  sodium chloride infusion, , Intravenous, Continuous, Wilmon Pali, PA;  DISCONTD: feeding supplement (PRO-STAT SUGAR FREE 64) liquid 30 mL, 30 mL, Oral, TID WC, Ailene Ards, RD, 30 mL at 02/26/12 1812;  DISCONTD: feeding supplement (PRO-STAT SUGAR FREE 64) liquid 30 mL, 30 mL, Oral, Q1200, Ailene Ards, RD DISCONTD: feeding supplement (RESOURCE BREEZE) liquid 1 Container, 1 Container, Oral, TID BM, Ailene Ards, RD, 1 Container at 02/26/12 2023;  DISCONTD: levalbuterol Pauline Aus) nebulizer solution 1.25 mg, 1.25 mg,  Nebulization, Q6H PRN, Kerin Perna, MD;  DISCONTD: morphine 2 MG/ML injection 2 mg, 2 mg, Intravenous, Q4H PRN, Kerin Perna, MD  Patients Current Diet: Cardiac  Precautions / Restrictions Precautions Precautions: Sternal;Fall Restrictions Weight Bearing Restrictions: No   Prior Activity Level  Very active daily. Worked full time. Chopped wood. 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    Extremity Assessment (includes Sensation/Coordination)  RUE ROM/Strength/Tone: [facility] for tasks assessed;Unable to fully assess;Due to precautions  RLE ROM/Strength/Tone: Deficits RLE ROM/Strength/Tone Deficits: grossly 3+/5 with functional activity    ADLs  Eating/Feeding: Simulated;Set up Where Assessed - Eating/Feeding: Chair Grooming: Performed;Wash/dry hands;Wash/dry face;Set up  Where Assessed - Grooming: Supported sitting Upper Body Bathing: Simulated;Moderate assistance Where Assessed - Upper Body Bathing: Unsupported sitting Lower Body Bathing: Simulated;+1 Total assistance Where Assessed - Lower Body Bathing: Supported sit to stand;Unsupported sitting Upper Body Dressing: Performed;Minimal assistance Where Assessed - Upper Body Dressing: Unsupported sitting Lower Body Dressing: Performed;+1 Total assistance Where Assessed - Lower Body Dressing: Unsupported sitting;Supported sit to stand Toilet Transfer:  Simulated;+2 Total assistance Toilet Transfer: Patient Percentage: 70% Toilet Transfer Method: Stand pivot Equipment Used: Gait belt Transfers/Ambulation Related to ADLs: transferred only with +2 assist, pt 70% ADL Comments: Pt limited by pain in neck/back.     Mobility  Bed Mobility: Sit to Supine Supine to Sit: 1: +2 Total assist;HOB flat;With rails Supine to Sit: Patient Percentage: 50% Sitting - Scoot to Edge of Bed: 3: Mod assist Sit to Supine: 1: +2 Total assist Sit to Supine: Patient Percentage: 50%    Transfers  Transfers: Sit to Stand;Stand to Sit Sit to Stand: 1: +2 Total assist;From bed;From chair/3-in-1 Sit to Stand: Patient Percentage: 70% Stand to Sit: 1: +2 Total assist;Other (comment);To chair/3-in-1;2: Max assist Stand to Sit: Patient Percentage: 70% Stand Pivot Transfers: 1: +2 Total assist Stand Pivot Transfers: Patient Percentage: 60%    Ambulation / Gait / Stairs / Wheelchair Mobility  Ambulation/Gait Ambulation/Gait Assistance: 1: +2 Total assist Ambulation/Gait: Patient Percentage: 60% Ambulation Distance (Feet): 30 Feet (15' + 15' ) Assistive device: Other (Comment) (used w/c for UE support) Ambulation/Gait Assistance Details: Pt with flexed posture, slow & small steps.  Cues for tall posture & safety Gait Pattern: Step-to pattern;Decreased step length - left;Decreased stance time - right;Shuffle;Trunk flexed Gait velocity: significantly slowed.    Posture / Balance Static Standing Balance Static Standing - Balance Support: Bilateral upper extremity supported (on walker) Static Standing - Level of Assistance: 4: Min assist     Previous Home Environment Living Arrangements: Lives Alone Available Help at Discharge: Family, Partner  Type of Home: House Home Layout: One level Home Access: Stairs to enter Entrance Stairs-Rails: None Entrance Stairs-Number of Steps: 2 Bathroom Shower/Tub: Health visitor: Standard Home Care Services:  No  Discharge Living Setting Plans for Discharge Living Setting: Patient's home Type of Home at Discharge: House Discharge Home Layout: One level Discharge Home Access: Stairs to enter Entrance Stairs-Rails: None Entrance Stairs-Number of Steps: 2 Do you have any problems obtaining your medications?: No  Social/Family/Support Systems Patient Roles: Partner;Parent Contact Information: Daughter: Daquan Rhoton Anticipated Caregiver: Daughter, Diannia Ruder Anticipated Caregiver's Contact Information: Delroy Hunton: [phone] / Partner: Evlyn Clines: [phone] Ability/Limitations of Caregiver: none Caregiver Availability: 24/7 Diannia Ruder to take [facility] & stay with pt. Pt's partner to help .) Discharge Plan Discussed with Primary Caregiver: Yes Is Caregiver In Agreement with Plan?: Yes Does Caregiver/Family have Issues with Lodging/Transportation while Pt is in Rehab?: No  Goals/Additional Needs Patient/Family Goal for Rehab: PT: mod I-S, OT: mod I - min A,, ST: mod I Expected length of stay: 2 weeks Cultural Considerations: none Dietary Needs: Heart healthy Equipment Needs: TBD Pt/Family Agrees to Admission and willing to participate: Yes Program Orientation Provided & Reviewed with Pt/Caregiver Including Roles  & Responsibilities: Yes (Provided tour of unit to pt's daughter.)  Patient Condition: Please see physician update to information in consult dated 02/23/12.  Preadmission Screen Completed By:  Meryl Dare, 02/28/2012 11:02 AM ______________________________________________________________________   Discussed status with Dr. Riley Kill on 02/29/12 at 10:50 AM and received telephone approval for admission today.  Admission Coordinator:  Meryl Dare,  time 10:50 AM/Date 02/29/12

## 2012-02-29 ENCOUNTER — Inpatient Hospital Stay (HOSPITAL_COMMUNITY): Payer: 59

## 2012-02-29 ENCOUNTER — Inpatient Hospital Stay (HOSPITAL_COMMUNITY)
Admission: RE | Admit: 2012-02-29 | Discharge: 2012-03-08 | DRG: 945 | Disposition: A | Discharge status: Home / Self Care | Payer: 59 | Source: Ambulatory Visit | Attending: Physical Medicine & Rehabilitation | Admitting: Physical Medicine & Rehabilitation

## 2012-02-29 DIAGNOSIS — I10 Essential (primary) hypertension: Secondary | ICD-10-CM | POA: Diagnosis present

## 2012-02-29 DIAGNOSIS — G9340 Encephalopathy, unspecified: Secondary | ICD-10-CM | POA: Diagnosis present

## 2012-02-29 DIAGNOSIS — Z5189 Encounter for other specified aftercare: Principal | ICD-10-CM

## 2012-02-29 DIAGNOSIS — G4733 Obstructive sleep apnea (adult) (pediatric): Secondary | ICD-10-CM | POA: Diagnosis present

## 2012-02-29 DIAGNOSIS — E785 Hyperlipidemia, unspecified: Secondary | ICD-10-CM | POA: Diagnosis present

## 2012-02-29 DIAGNOSIS — Z7982 Long term (current) use of aspirin: Secondary | ICD-10-CM

## 2012-02-29 DIAGNOSIS — J45909 Unspecified asthma, uncomplicated: Secondary | ICD-10-CM | POA: Diagnosis present

## 2012-02-29 DIAGNOSIS — F411 Generalized anxiety disorder: Secondary | ICD-10-CM | POA: Diagnosis present

## 2012-02-29 DIAGNOSIS — I7101 Dissection of thoracic aorta: Secondary | ICD-10-CM

## 2012-02-29 DIAGNOSIS — D649 Anemia, unspecified: Secondary | ICD-10-CM | POA: Diagnosis present

## 2012-02-29 DIAGNOSIS — R5381 Other malaise: Secondary | ICD-10-CM | POA: Diagnosis present

## 2012-02-29 DIAGNOSIS — G9341 Metabolic encephalopathy: Secondary | ICD-10-CM

## 2012-02-29 DIAGNOSIS — R0602 Shortness of breath: Secondary | ICD-10-CM | POA: Diagnosis present

## 2012-02-29 DIAGNOSIS — F17200 Nicotine dependence, unspecified, uncomplicated: Secondary | ICD-10-CM | POA: Diagnosis present

## 2012-02-29 DIAGNOSIS — I71019 Dissection of thoracic aorta, unspecified: Secondary | ICD-10-CM | POA: Diagnosis present

## 2012-02-29 DIAGNOSIS — I319 Disease of pericardium, unspecified: Secondary | ICD-10-CM

## 2012-02-29 LAB — BASIC METABOLIC PANEL
BUN: 22 mg/dL (ref 6–23)
CO2: 23 mEq/L (ref 19–32)
Calcium: 8 mg/dL — ABNORMAL LOW (ref 8.4–10.5)
Chloride: 104 mEq/L (ref 96–112)
Creatinine, Ser: 0.78 mg/dL (ref 0.50–1.35)
GFR calc Af Amer: >90 mL/min (ref >90)
GFR calc non Af Amer: >90 mL/min (ref >90)
Glucose, Bld: 117 mg/dL — ABNORMAL HIGH (ref 70–99)
Potassium: 3.4 mEq/L — ABNORMAL LOW (ref 3.5–5.1)
Sodium: 136 mEq/L (ref 135–145)

## 2012-02-29 LAB — CBC
HCT: 26.5 % — ABNORMAL LOW (ref 39.0–52.0)
Hemoglobin: 8.7 g/dL — ABNORMAL LOW (ref 13.0–17.0)
MCH: 30.4 pg (ref 26.0–34.0)
MCHC: 32.8 g/dL (ref 30.0–36.0)
MCV: 92.7 fL (ref 78.0–100.0)
Platelets: 177 10*3/uL (ref 150–400)
RBC: 2.86 MIL/uL — ABNORMAL LOW (ref 4.22–5.81)
RDW: 14.7 % (ref 11.5–15.5)
WBC: 11 10*3/uL — ABNORMAL HIGH (ref 4.0–10.5)

## 2012-02-29 LAB — GLUCOSE, CAPILLARY
Glucose-Capillary: 122 mg/dL — ABNORMAL HIGH (ref 70–99)
Glucose-Capillary: 153 mg/dL — ABNORMAL HIGH (ref 70–99)
Glucose-Capillary: 101 mg/dL — ABNORMAL HIGH (ref 70–99)
Glucose-Capillary: 191 mg/dL — ABNORMAL HIGH (ref 70–99)

## 2012-02-29 MED ORDER — INSULIN GLARGINE 100 UNIT/ML ~~LOC~~ SOLN
24.0000 [IU] | Freq: Every day | SUBCUTANEOUS | Status: DC
  Administered 2012-03-01 – 2012-03-02 (×2): 24 [IU] via SUBCUTANEOUS

## 2012-02-29 MED ORDER — HYDROCODONE-ACETAMINOPHEN 5-325 MG PO TABS
1.0000 | ORAL_TABLET | ORAL | Status: DC | PRN
  Administered 2012-03-01 – 2012-03-08 (×18): 1 via ORAL
  Filled 2012-02-29 (×20): qty 1

## 2012-02-29 MED ORDER — ATORVASTATIN CALCIUM 20 MG PO TABS
20.0000 mg | ORAL_TABLET | Freq: Every day | ORAL | Status: DC
  Administered 2012-02-29 – 2012-03-07 (×8): 20 mg via ORAL
  Filled 2012-02-29 (×9): qty 1

## 2012-02-29 MED ORDER — ONDANSETRON HCL 4 MG PO TABS: 4.0000 mg | ORAL_TABLET | Freq: Four times a day (QID) | ORAL | Status: DC | PRN

## 2012-02-29 MED ORDER — SORBITOL 70 % SOLN: 30.0000 mL | Freq: Every day | Status: DC | PRN

## 2012-02-29 MED ORDER — FLUTICASONE PROPIONATE 50 MCG/ACT NA SUSP
2.0000 | Freq: Every day | NASAL | Status: DC
  Administered 2012-03-01 – 2012-03-08 (×8): 2 via NASAL
  Filled 2012-02-29: qty 16

## 2012-02-29 MED ORDER — ASPIRIN 81 MG PO CHEW: 81.0000 mg | CHEWABLE_TABLET | Freq: Every day | ORAL | Status: DC

## 2012-02-29 MED ORDER — PANTOPRAZOLE SODIUM 40 MG PO TBEC
40.0000 mg | DELAYED_RELEASE_TABLET | Freq: Every day | ORAL | Status: DC
Start: 2012-03-01 — End: 2012-03-08
  Administered 2012-03-01 – 2012-03-08 (×8): 40 mg via ORAL
  Filled 2012-02-29 (×8): qty 1

## 2012-02-29 MED ORDER — POLYETHYLENE GLYCOL 3350 17 G PO PACK
17.0000 g | PACK | Freq: Every day | ORAL | Status: DC | PRN
  Filled 2012-02-29: qty 1

## 2012-02-29 MED ORDER — BOOST / RESOURCE BREEZE PO LIQD
1.0000 | Freq: Two times a day (BID) | ORAL | Status: DC
  Administered 2012-03-01 – 2012-03-08 (×13): 1 via ORAL

## 2012-02-29 MED ORDER — NICOTINE 21 MG/24HR TD PT24
21.0000 mg | MEDICATED_PATCH | Freq: Every day | TRANSDERMAL | Status: DC
  Administered 2012-03-01: 21 mg via TRANSDERMAL
  Filled 2012-02-29 (×10): qty 1

## 2012-02-29 MED ORDER — ONDANSETRON HCL 4 MG/2ML IJ SOLN: 4.0000 mg | Freq: Four times a day (QID) | INTRAMUSCULAR | Status: DC | PRN

## 2012-02-29 MED ORDER — MONTELUKAST SODIUM 10 MG PO TABS
10.0000 mg | ORAL_TABLET | Freq: Every day | ORAL | Status: DC
  Administered 2012-03-01 – 2012-03-08 (×8): 10 mg via ORAL
  Filled 2012-02-29 (×11): qty 1

## 2012-02-29 MED ORDER — SODIUM CHLORIDE 0.9 % IJ SOLN
10.0000 mL | INTRAMUSCULAR | Status: DC | PRN
  Administered 2012-02-29 (×3): 10 mL

## 2012-02-29 MED ORDER — METOPROLOL TARTRATE 25 MG PO TABS
25.0000 mg | ORAL_TABLET | Freq: Two times a day (BID) | ORAL | Status: DC
  Administered 2012-02-29: 12.5 mg via ORAL
  Administered 2012-03-01 – 2012-03-08 (×15): 25 mg via ORAL
  Filled 2012-02-29 (×19): qty 1

## 2012-02-29 MED ORDER — METOPROLOL TARTRATE 25 MG PO TABS: 25.0000 mg | ORAL_TABLET | Freq: Two times a day (BID) | ORAL | Status: DC

## 2012-02-29 MED ORDER — HYDROCODONE-ACETAMINOPHEN 5-325 MG PO TABS: 1.0000 | ORAL_TABLET | ORAL | Status: DC | PRN

## 2012-02-29 MED ORDER — ACETAMINOPHEN 325 MG PO TABS
325.0000 mg | ORAL_TABLET | ORAL | Status: DC | PRN
  Administered 2012-03-05 (×2): 325 mg via ORAL
  Administered 2012-03-06: 650 mg via ORAL
  Filled 2012-02-29: qty 2
  Filled 2012-02-29 (×2): qty 1
  Filled 2012-02-29 (×2): qty 2

## 2012-02-29 MED ORDER — SENNA 8.6 MG PO TABS
1.0000 | ORAL_TABLET | Freq: Two times a day (BID) | ORAL | Status: DC
  Administered 2012-02-29 – 2012-03-08 (×16): 8.6 mg via ORAL
  Filled 2012-02-29 (×21): qty 1

## 2012-02-29 MED ORDER — POTASSIUM CHLORIDE CRYS ER 20 MEQ PO TBCR
20.0000 meq | EXTENDED_RELEASE_TABLET | Freq: Every day | ORAL | Status: DC
  Administered 2012-03-01 – 2012-03-08 (×8): 20 meq via ORAL
  Filled 2012-02-29 (×9): qty 1

## 2012-02-29 MED ORDER — ASPIRIN 81 MG PO CHEW
81.0000 mg | CHEWABLE_TABLET | Freq: Every day | ORAL | Status: DC
  Administered 2012-03-01 – 2012-03-08 (×8): 81 mg via ORAL
  Filled 2012-02-29 (×8): qty 1

## 2012-02-29 MED ORDER — INSULIN ASPART 100 UNIT/ML ~~LOC~~ SOLN
0.0000 [IU] | Freq: Three times a day (TID) | SUBCUTANEOUS | Status: DC
  Administered 2012-03-01: 8 [IU] via SUBCUTANEOUS
  Administered 2012-03-01 – 2012-03-02 (×3): 2 [IU] via SUBCUTANEOUS
  Administered 2012-03-03: 4 [IU] via SUBCUTANEOUS
  Administered 2012-03-03 – 2012-03-05 (×2): 2 [IU] via SUBCUTANEOUS
  Administered 2012-03-05: 1 [IU] via SUBCUTANEOUS
  Administered 2012-03-06: 4 [IU] via SUBCUTANEOUS
  Administered 2012-03-07 – 2012-03-08 (×3): 2 [IU] via SUBCUTANEOUS

## 2012-02-29 NOTE — Progress Notes (Signed)
9 Days Post-Op Procedure(s) (LRB): THORACIC ASCENDING ANEURYSM REPAIR (AAA) (N/A) Subjective: Patient complains of being tired this morning.  States he did not sleep well last night and was unable to get comfortable in the bed.   Objective: Vital signs in last 24 hours: Temp:  [97.3 F (36.3 C)-98.5 F (36.9 C)] 98 F (36.7 C) (09/17 0531) Pulse Rate:  [68-83] 79  (09/17 0531) Cardiac Rhythm:  [-] Normal sinus rhythm (09/16 1940) Resp:  [15-27] 18  (09/17 0531) BP: (105-144)/(46-67) 105/46 mmHg (09/17 0531) SpO2:  [94 %-100 %] 94 % (09/17 0531) Weight:  [228 lb 1.6 oz (103.465 kg)] 228 lb 1.6 oz (103.465 kg) (09/17 0531)  Intake/Output from previous day: 09/16 0701 - 09/17 0700 In: 480 [P.O.:480] Out: 225 [Urine:225]  General appearance: alert, cooperative and no distress Neurologic: intact Heart: regular rate and rhythm Lungs: clear to auscultation bilaterally Abdomen: soft, non-tender; bowel sounds normal; no masses,  no organomegaly Extremities: edema trace Wound: clean and dry, staples remain in place over right axillary cannulation site  Lab Results:  Basename 02/29/12 0600 02/28/12 0420  WBC 11.0* 12.9*  HGB 8.7* 9.6*  HCT 26.5* 28.6*  PLT 177 149*   BMET:  Basename 02/29/12 0600 02/28/12 0420  NA 136 135  K 3.4* 3.8  CL 104 101  CO2 23 22  GLUCOSE 117* 135*  BUN 22 24*  CREATININE 0.78 0.74  CALCIUM 8.0* 8.5    PT/INR: No results found for this basename: LABPROT,INR in the last 72 hours ABG    Component Value Date/Time   PHART 7.442 02/22/2012 2035   HCO3 24.5* 02/22/2012 2035   TCO2 26 02/22/2012 2035   ACIDBASEDEF 1.0 02/21/2012 1351   O2SAT 86.0 02/22/2012 2035   CBG (last 3)   Basename 02/29/12 0619 02/28/12 2107 02/28/12 1622  GLUCAP 122* 142* 173*    Assessment/Plan: S/P Procedure(s) (LRB): THORACIC ASCENDING ANEURYSM REPAIR (AAA) (N/A)  1.  CV- NSR, pressure well controlled continue Lopressor 2. Pulm- no acute issues 3. Deconditioning-  PT/OT recommending inpatient rehab 4. CBGs- no history of DM, will d/c CBGs 5. Dispo- will d/c EPW today, pending no arrythmias, patient can be discharged to rehab today   LOS: 10 days    Lowella Dandy 02/29/2012

## 2012-02-29 NOTE — Discharge Summary (Signed)
patient examined and medical record reviewed,agree with above note.  The patient has a known 6.2cm AAA infrarenal that will be treated by Dr Betti Cruz in about 12 weeks [name],[name] 02/29/2012

## 2012-02-29 NOTE — Progress Notes (Signed)
D/C EPW per protocol and as ordered, all ends intact, no bleeding/ectopy noted, pt reminded to lie supine approximately one hour.  INR 1.50, VSS.  Will continue to monitor.  Ninetta Lights RN

## 2012-02-29 NOTE — Discharge Summary (Signed)
[address]            [place] [address]          [phone]      [name] [birth year] 61 y.o. [id number]  02/19/2012   Kerin Perna, MD  Aortic dissection [441.00] cp  History of Present Illness:  61 year old Caucasian male smoker presented to the ED with transient neurologic symptoms of headache, difficulty with speech, leg weakness. A head CT scan was performed which was negative and his symptoms resolved. He started having chest pain and a CT scan was performed to rule out pulmonary embolus. A second CTA of the thoracic and abdominal aorta demonstrated a type A dissection with the origin in the ascending aorta above the aortic valve continuing down to just above the aortic bifurcation. The ascending aorta measures 5 cm diameter. The abdominal aorta has a 6.1 cm infrarenal calcified aneurysm. The iliac arteries appear to be normal.  The patient's 12-lead EKG shows no acute abnormalities with sinus rhythm. Troponin is mildly elevated at 0.4 and d-dimer is elevated greater than 20.  The patient denies history of hypertension. The patient  denies family history of dissection or aortic aneurysm disease. He was felt to require admission for resection and grafting of the ascending aorta with hemi-arch replacement and was admitted to the hospital.  Past Medical History   Diagnosis  Date   .  OBSTRUCTIVE SLEEP APNEA  02/2008 sleep study   .  MIGRAINE HEADACHE    .  ERECTILE DYSFUNCTION, ORGANIC    .  Unspecified vitamin D deficiency    .  HYPERLIPIDEMIA    .  ASTHMA    .  ALLERGIC RHINITIS     Past Surgical History   Procedure  Date   .  Nasal fx without repair     History   Smoking status   .  Current Everyday Smoker -- 0.1 packs/day   Smokeless tobacco   .  Not on file    Comment: 10 cigs/day    History   Alcohol Use   .  Yes     Rarely    History    Social History   .  Marital Status:  Legally Separated     Spouse Name:   N/A     Number of Children:  N/A   .  Years of Education:  N/A    Occupational History   .  Not on file.    Social History Main Topics   .  Smoking status:  Current Everyday Smoker -- 0.1 packs/day   .  Smokeless tobacco:  Not on file     Comment: 10 cigs/day    .  Alcohol Use:  Yes      Rarely   .  Drug Use:  Not on file   .  Sexually Active:  Not on file    Other Topics  Concern   .  Not on file    Social History Narrative   .  No narrative on file    Allergies   Allergen  Reactions   .  Testosterone  Shortness Of Breath     Medication:Androgel  Pt states trouble breathing at night      Hospital Course:  The patient was admitted to the hospital and taken to the operating room on 02/19/2012 - 02/20/2012 and underwent Procedure(s):OPERATIVE REPORT  OPERATION:  1. Repair of type A thoracic aortic dissection with replacement of the  ascending aorta and hemi-arch reconstruction of the aortic arch  using a 30 mm Dacron graft for the ascending aorta and a 32 mm  Dacron graft for the hemi-arch reconstruction.  2. Resuspension of the aortic valve.  3. Right axillary artery cannulation with continuous antegrade  cerebral perfusion during hypothermic circulatory arrest.  SURGEON: Kerin Perna, M.D.  ASSISTANT: Coral Ceo, PA-C.  ANESTHESIA: General by Dr. Bedelia Person.    Post operative hospital course:  The patient was initially on inotropic support including milrinone, dopamine and Neo-Synephrine drips These were able to be weaned without significant difficulty. He was weaned from the ventilator without difficulty as well. He has remained grossly neurologically intact but has had some difficulty with impulse control as well as some confusion. This has improved over time. He has a moderate postoperative volume overload but has responded well to diuretics. He has an expected acute blood loss anemia and hemoglobin and hematocrit have stabilized. He has a postoperative  thrombocytopenia but values have stabilized into the normal range. All routine lines, monitors and drainage devices have been discontinued in the standard fashion. Renal function is within normal limits. He has required some potassium supplementation. Blood pressure has been under good control. He has maintained normal sinus rhythm without significant cardiac dysrhythmias. He has significant deconditioning and has been working with physical and occupational therapy. They recommend inpatient rehabilitation . It is felt at this time that he is stable for transfer when bed available to the rehabilitation department.   Basename 02/29/12 0600 02/28/12 0420  NA 136 135  K 3.4* 3.8  CL 104 101  CO2 23 22  GLUCOSE 117* 135*  BUN 22 24*  CALCIUM 8.0* 8.5    Basename 02/29/12 0600 02/28/12 0420  WBC 11.0* 12.9*  HGB 8.7* 9.6*  HCT 26.5* 28.6*  PLT 177 149*   No results found for this basename: INR:2 in the last 72 hours   Discharge Instructions:  The patient will be transferred to inpatient rehabilitation for ongoing physical and occupational therapy modalities. Sternal precautions will be continued. He is recommended to continue a heart healthy diet with low sodium. Incisions may be cleaned gently with soap and water.   Discharge Diagnosis:  Aortic dissection [441.00] cp  Secondary Diagnosis: Patient Active Problem List  Diagnosis  . UNSPECIFIED VITAMIN D DEFICIENCY  . HYPERLIPIDEMIA  . OBSTRUCTIVE SLEEP APNEA  . MIGRAINE HEADACHE  . ALLERGIC RHINITIS  . ASTHMA  . ERECTILE DYSFUNCTION, ORGANIC  . Hypogonadism male   Past Medical History  Diagnosis Date  . OBSTRUCTIVE SLEEP APNEA 02/2008 sleep study  . MIGRAINE HEADACHE   . ERECTILE DYSFUNCTION, ORGANIC   . Unspecified vitamin D deficiency   . HYPERLIPIDEMIA   . ASTHMA   . ALLERGIC RHINITIS        [name], [name]  Home Medication Instructions [id number]   Printed on:02/29/12 0915  Medication Information                     fexofenadine (ALLEGRA) 180 MG tablet Take 180 mg by mouth daily.             montelukast (SINGULAIR) 10 MG tablet Take 10 mg by mouth daily.           omega-3 acid ethyl esters (LOVAZA) 1 G capsule Take 1 g by mouth 2 (two) times daily.  Cyanocobalamin (VITAMIN B-12 PO) Take 1 tablet by mouth daily.           ezetimibe-simvastatin (VYTORIN) 10-40 MG per tablet Take 0.5 tablets by mouth daily.           predniSONE (DELTASONE) 5 MG tablet Take 5 mg by mouth daily as needed. For asthma flare up           rosuvastatin (CRESTOR) 10 MG tablet Take 10 mg by mouth daily.           ALPRAZolam (XANAX) 1 MG tablet Take 1 mg by mouth at bedtime as needed. For insomnia           cholecalciferol (VITAMIN D) 1000 UNITS tablet Take 2,000 Units by mouth daily.           tretinoin (RETIN-A) 0.1 % cream Apply 1 application topically daily as needed. For acne or rash           fluticasone (FLONASE) 50 MCG/ACT nasal spray Place 2 sprays into the nose daily.           EPINEPHrine (EPI-PEN) 0.3 mg/0.3 mL DEVI Inject 0.3 mg into the muscle as needed. For anaphylaxis            aspirin 81 MG chewable tablet Chew 1 tablet (81 mg total) by mouth daily.           metoprolol tartrate (LOPRESSOR) 25 MG tablet Take 1 tablet (25 mg total) by mouth 2 (two) times daily.           HYDROcodone-acetaminophen (NORCO/VICODIN) 5-325 MG per tablet Take 1 tablet by mouth every 4 (four) hours as needed.             Disposition: For transfer to CIR  Patient's condition is Good  Gershon Crane, PA-C 02/29/2012  9:15 AM   At time of discharge from rehabilitation the patient should have an appointment arranged to see Dr. Donata Clay in 3 weeks.

## 2012-02-29 NOTE — H&P (Signed)
Physical Medicine and Rehabilitation Admission H&P  Chief Complaint   Patient presents with   .  Code Stroke   :  HPI: [name] is a 61 y.o. right-handed male with obstructive sleep apnea, tobacco abuse. 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 Admitted 02/20/2012 with transient symptoms of headache difficulty with speech and leg weakness. Head CT scan was performed that was negative. He started having chest pain and a CT scan was performed rule out pulmonary embolism. A second CTA of the thoracic and abdominal aorta demonstrated a type A dissection with the origin and the asymmetry aorta above the aortic valve continuing down just above the aortic bifurcation. Underwent emergent repair of thoracic aortic dissection 02/20/2012 per Dr. Donata Clay. Noted bouts of hypoxia patient refusing oxygen at times. Followup speech therapy questionable dysphagia currently maintained on a clear liquid diet and since advanced to a regular consistency. Patient with some elevated blood sugar readings with hemoglobin A1c of 7.5 he is presently on Lantus insulin and monitored. Postoperative anemia 7.5-8.9 and monitored. A mediastinal chest tube has been in place and vancomycin was initiated prophylactically. His chest tube has since been removed and all antibiotics discontinued with pacing wires also removed 02/29/2012. Patient with noted bouts of confusion as well his recall of his hospitalizations events suspect encephalopathy. Physical therapy ongoing for deconditioning request physical medicine rehabilitation consult to consider inpatient rehabilitation services. Patient was felt to be acute candidate for inpatient rehabilitation services and was admitted for comprehensive rehabilitation program.  Review of Systems  Respiratory: Positive for shortness of breath.  Cardiovascular: Positive for palpitations.  Neurological: Positive for weakness, headaches. Patient also  with history of anxiety  All other systems reviewed and are negative  Past Medical History   Diagnosis  Date   .  OBSTRUCTIVE SLEEP APNEA  02/2008 sleep study   .  MIGRAINE HEADACHE    .  ERECTILE DYSFUNCTION, ORGANIC    .  Unspecified vitamin D deficiency    .  HYPERLIPIDEMIA    .  ASTHMA    .  ALLERGIC RHINITIS     Past Surgical History   Procedure  Date   .  Nasal fx without repair    .  Thoracic aortic aneurysm repair  02/20/2012     Procedure: THORACIC ASCENDING ANEURYSM REPAIR (AAA); Surgeon: Kerin Perna, MD; Location: [facility] OR; Service: Open Heart Surgery; Laterality: N/A;    Family History   Problem  Relation  Age of Onset   .  Diabetes  Mother    .  Heart disease  Maternal Uncle    .  Heart disease  Paternal Uncle    .  Heart disease  Cousin    .  Lung cancer  Cousin       2 cousins    Social History: reports that he has been smoking. He does not have any smokeless tobacco history on file. He reports that he drinks alcohol. His drug history not on file.  Allergies:  Allergies   Allergen  Reactions   .  Testosterone  Shortness Of Breath     Medication:Androgel  Pt states trouble breathing at night    Medications Prior to Admission   Medication  Sig  Dispense  Refill   .  ALPRAZolam (XANAX) 1 MG tablet  Take 1 mg by mouth at bedtime as needed. For insomnia     .  cholecalciferol (VITAMIN D) 1000 UNITS tablet  Take  2,000 Units by mouth daily.     .  Cyanocobalamin (VITAMIN B-12 PO)  Take 1 tablet by mouth daily.     [name]  EPINEPHrine (EPI-PEN) 0.3 mg/0.3 mL DEVI  Inject 0.3 mg into the muscle as needed. For anaphylaxis     .  ezetimibe-simvastatin (VYTORIN) 10-40 MG per tablet  Take 0.5 tablets by mouth daily.     .  fexofenadine (ALLEGRA) 180 MG tablet  Take 180 mg by mouth daily.     .  fluticasone (FLONASE) 50 MCG/ACT nasal spray  Place 2 sprays into the nose daily.     .  montelukast (SINGULAIR) 10 MG tablet  Take 10 mg by mouth daily.     [name]  omega-3 acid ethyl  esters (LOVAZA) 1 G capsule  Take 1 g by mouth 2 (two) times daily.     .  predniSONE (DELTASONE) 5 MG tablet  Take 5 mg by mouth daily as needed. For asthma flare up     .  rosuvastatin (CRESTOR) 10 MG tablet  Take 10 mg by mouth daily.     [name]  tretinoin (RETIN-A) 0.1 % cream  Apply 1 application topically daily as needed. 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 Status:  Mobility:  Bed Mobility  Bed Mobility: Not assessed  Left Sidelying to Sit: 3: Mod assist;HOB flat  Supine to Sit: 1: +2 Total assist;HOB flat;With rails  Supine to Sit: Patient Percentage: 50%  Sitting - Scoot to Edge of Bed: 3: Mod assist  Sit to Supine: 1: +2 Total assist  Sit to Supine: Patient Percentage: 50%  Transfers  Transfers: Sit to Stand;Stand to Sit  Sit to Stand: 4: Min assist;From chair/3-in-1  Sit to Stand: Patient Percentage: 70%  Stand to Sit: 4: Min assist;Without upper extremity assist;To chair/3-in-1  Stand to Sit: Patient Percentage: 70%  Stand Pivot Transfers: 1: +2 Total assist  Stand Pivot Transfers: Patient Percentage: 60%  Ambulation/Gait  Ambulation/Gait Assistance: 4: Min assist  Ambulation/Gait: Patient Percentage: 60%  Ambulation Distance (Feet): 150 Feet  Assistive device: Rolling walker  Ambulation/Gait Assistance Details: verbal cues to stand erect  Gait Pattern: Step-through pattern;Decreased stride length;Trunk flexed  Gait velocity: decr   ADL:  ADL  Eating/Feeding: Simulated;Set  up  Where Assessed - Eating/Feeding: Chair  Grooming: Performed;Wash/dry hands;Minimal assistance  Where Assessed - Grooming: Supported standing  Upper Body Bathing: Simulated;Moderate assistance  Where Assessed - Upper Body Bathing: Unsupported sitting  Lower Body Bathing: Simulated;+1 Total assistance  Where Assessed - Lower Body Bathing: Supported sit to stand;Unsupported sitting  Upper Body Dressing: Performed;Minimal assistance  Where Assessed - Upper Body Dressing: Unsupported sitting  Lower Body Dressing: Performed;Minimal assistance  Where Assessed - Lower Body Dressing: Supported sit to stand  Toilet Transfer: Performed;Minimal assistance  Toilet Transfer Method: Sit to Production manager: Bedside commode  Equipment Used: Rolling walker;Gait belt  Transfers/Ambulation Related to ADLs: min assist, verbal cues for technique  ADL Comments: Instructed  pt in sternal precautions related to ADL.  Cognition:  Cognition  Arousal/Alertness: Awake/alert  Orientation Level: Oriented X4  Cognition  Overall Cognitive Status: Impaired  Area of Impairment: Memory  Difficult to assess due to: Level of arousal  Arousal/Alertness: Awake/alert  Orientation Level: Disoriented to;Time  Behavior During Session: WFL for tasks performed  Memory: Decreased recall of precautions  Cognition - Other Comments: pt with some mild confusion  Blood pressure 105/46, pulse 79, temperature 98 F (36.7 C), temperature source Oral, resp. rate 18, height 6\' 4"  (1.93 m), weight 103.465 kg (228 lb 1.6 oz), SpO2 94.00%.   Physical Exam  Vitals reviewed.  Constitutional: He is oriented to person, place, and time. He could not recall are conversation yesterday concerning inpatient rehabilitation.  HENT:  Head: Normocephalic.  Eyes:  Pupils round and reactive to light , oral mucosa pink and moist Neck: Neck supple. No thyromegaly present.  Cardiovascular: Normal rate and regular rhythm. No  murmurs Pulmonary/Chest: Breath sounds normal. No respiratory distress. He has no wheezes.  Abdominal: Bowel sounds are normal. He exhibits no distension. There is no tenderness.  Musculoskeletal: He exhibits edema.  Neurological: He is alert and oriented to person, place, and time. He has normal reflexes. CN normal Follows three-step commands. Thought processing much improved. . Mild weakness with biceps and wrist extension, 4/5. [name] Tingling on palmar 1st and 2nd fingers of right hand. Good sitting balance. General weakness in legs 3-4+ proximal weaker than distal. DTR's are grossly 1+ Skin:  Chest incision clean and dry, right shoulder incision intact as well.  Psychiatric: He has a normal mood and affect  Results for orders placed during the hospital encounter of 02/19/12 (from the past 48 hour(s))   GLUCOSE, CAPILLARY Status: Abnormal    Collection Time    02/27/12 7:32 AM   Component  Value  Range  Comment    Glucose-Capillary  145 (*)  70 - 99 mg/dL     Comment 1  Notify RN     GLUCOSE, CAPILLARY Status: Abnormal    Collection Time    02/27/12 11:34 AM   Component  Value  Range  Comment    Glucose-Capillary  147 (*)  70 - 99 mg/dL     Comment 1  Notify RN     GLUCOSE, CAPILLARY Status: Abnormal    Collection Time    02/27/12 4:59 PM   Component  Value  Range  Comment    Glucose-Capillary  153 (*)  70 - 99 mg/dL     Comment 1  Notify RN     GLUCOSE, CAPILLARY Status: Abnormal    Collection Time    02/27/12 9:56 PM   Component  Value  Range  Comment    Glucose-Capillary  135 (*)  70 - 99 mg/dL    CBC Status: Abnormal    Collection Time    02/28/12 4:20 AM   Component  Value  Range  Comment    WBC  12.9 (*)  4.0 - 10.5 K/uL     RBC  3.10 (*)  4.22 - 5.81 MIL/uL     Hemoglobin  9.6 (*)  13.0 - 17.0 g/dL     HCT  16.1 (*)  09.6 - 52.0 %     MCV  92.3  78.0 - 100.0 fL     MCH  31.0  26.0 - 34.0 pg     MCHC  33.6  30.0 - 36.0 g/dL     RDW  04.5  11.5 - 15.5 %     Platelets  149  (*)  150 - 400 K/uL    BASIC METABOLIC PANEL Status: Abnormal    Collection Time    02/28/12 4:20 AM   Component  Value  Range  Comment    Sodium  135  135 - 145 mEq/L     Potassium  3.8  3.5 - 5.1 mEq/L     Chloride  101  96 - 112 mEq/L     CO2  22  19 - 32 mEq/L     Glucose, Bld  135 (*)  70 - 99 mg/dL     BUN  24 (*)  6 - 23 mg/dL     Creatinine, Ser  0.98  0.50 - 1.35 mg/dL     Calcium  8.5  8.4 - 10.5 mg/dL     GFR calc non Af Amer  >90  >90 mL/min     GFR calc Af Amer  >90  >90 mL/min    GLUCOSE, CAPILLARY Status: Abnormal    Collection Time    02/28/12 7:39 AM   Component  Value  Range  Comment    Glucose-Capillary  127 (*)  70 - 99 mg/dL     Comment 1  Documented in Chart      Comment 2  Notify RN     GLUCOSE, CAPILLARY Status: Abnormal    Collection Time    02/28/12 11:51 AM   Component  Value  Range  Comment    Glucose-Capillary  128 (*)  70 - 99 mg/dL    GLUCOSE, CAPILLARY Status: Abnormal    Collection Time    02/28/12 4:22 PM   Component  Value  Range  Comment    Glucose-Capillary  173 (*)  70 - 99 mg/dL    GLUCOSE, CAPILLARY Status: Abnormal    Collection Time    02/28/12 9:07 PM   Component  Value  Range  Comment    Glucose-Capillary  142 (*)  70 - 99 mg/dL     Ct Head Wo Contrast  02/28/2012 *RADIOLOGY REPORT* Clinical Data: Open heart surgery. Slow word finding and confusion. CT HEAD WITHOUT CONTRAST Technique: Contiguous axial images were obtained from the base of the skull through the vertex without contrast. Comparison: CT 02/19/2012 Findings: Ventricle size is normal. Negative for acute infarct. Negative for hemorrhage or mass lesion. Calvarium intact. IMPRESSION: No acute abnormality and no interval change. Original Report Authenticated By: Camelia Phenes, M.D.  Dg Chest Port 1 View  02/28/2012 *RADIOLOGY REPORT* Clinical Data: Status post repair of thoracic ascending aortic aneurysm PORTABLE CHEST - 1 VIEW Comparison: Prior chest x-ray 02/26/2012 Findings:  Unchanged position of left upper extremity approach PICC. The patient is rotated to the left. Slight interval increase in lung field opacity in the left hemithorax likely reflecting an enlarging, layering pleural effusion and associated basilar atelectasis. The right lung is clear, and well aerated. Surgical clips project over the right upper thorax. IMPRESSION: 1. Enlarging left layering pleural effusion and associated atelectasis. 2. Surgical changes and left upper extremity PICC remain unchanged. Original Report Authenticated By: Jarrett Soho Admission Physician Evaluation:  1. Functional deficits secondary to aortic aneursym s/p repair, post-operative encephalopathy, mild ?upper trunk brachial plexopathy 2. Patient is admitted to receive collaborative, interdisciplinary care between the physiatrist, rehab nursing staff, and therapy team. 3. Patient's level of medical complexity and substantial therapy needs in context of that medical necessity cannot be provided at a  lesser intensity of care such as a SNF. 4. Patient has experienced substantial functional loss from his/her baseline which was documented above under the "Functional History" and "Functional Status" headings. Judging by the patient's diagnosis, physical exam, and functional history, the patient has potential for functional progress which will result in measurable gains while on inpatient rehab. These gains will be of substantial and practical use upon discharge in facilitating mobility and self-care at the household level. 5. Physiatrist will provide 24 hour management of medical needs as well as oversight of the therapy plan/treatment and provide guidance as appropriate regarding the interaction of the two. 6. 24 hour rehab nursing will assist with bladder management, bowel management, safety, skin/wound care, disease management, medication administration, pain management and patient education and help integrate therapy concepts,  techniques,education, etc. 7. PT will assess and treat for: fxnl mobility, strength, ROM, adaptive equipment, sternal precautions. Goals are: mod I. 8. OT will assess and treat for: UES, ROM, fxnl mobiltity, ADL's, . Goals are: mod I. 9. SLP will assess and treat for: cognition, communication. Goals are: mod I. 10. Case Management and Social Worker will assess and treat for psychological issues and discharge planning. 11. Team conference will be held weekly to assess progress toward goals and to determine barriers to discharge. 12. Patient will receive at least 3 hours of therapy per day at least 5 days per week. 13. ELOS: 7 days Prognosis: excellent   Medical Problem List and Plan:  1. Thoracic aortic aneurysm status post emergent repair with postop encephalopathy 02/20/2012  2. DVT Prophylaxis/Anticoagulation: SCDs when in bed. Patient is ambulatory  3. Pain Management: Vicodin as needed. Monitor with increased mobility  4. Mood/anxiety. Patient on Xanax in the past. Will monitor closely and provide emotional support  5. Neuropsych: This patient is capable of making decisions on his/her own behalf.  6. Postoperative anemia. Latest hemoglobin 9.6. Followup labs  7. Hypertension. Lopressor 25 mg twice a day. Monitor with increased mobility  8. Elevated blood sugars. Hemoglobin A1c of 7.5. Continue Lantus insulin 24 units daily. Patient on no diabetic agents prior to admission. He does family history of diabetes mellitus. Check blood sugars a.c. and at bedtime. Consider change to oral agents of possible  9. Asthma/tobacco abuse. Continue NicoDerm patch. Singulair 10 mg daily, Flonase daily. Provide counseling in regards to tobacco abuse  10. Hyperlipidemia. Lipitor  Ivory Broad, MD

## 2012-02-29 NOTE — Progress Notes (Signed)
Pt complaining of numbness in right elbow and tingling in fingers. Informed pt that this may be common after open heart surgery. Had pt squeeze both hands and there was equal strength in both. Pt had full range of motion in right arm and able to hold right arm up. Pt states joint just feels weak. Pt stated he mentioned symptoms to surgeon, passed on to day shift nurse and encouraged to mention to PA in the am.

## 2012-02-29 NOTE — Plan of Care (Signed)
Overall Plan of Care [facility]) Patient Details Name: [name] MRN: [id number] DOB: 12-05-1950  Diagnosis:    Primary Diagnosis:    Acute encephalopathy Co-morbidities: AAA, dvt, OSA  Functional Problem List  Patient demonstrates impairments in the following areas: Balance, Cognition, Edema, Endurance, Motor, Pain, Safety, Sensory  and Skin Integrity  Basic ADL's: grooming, bathing, dressing and toileting Advanced ADL's: NA  Transfers:  bed mobility, bed to chair, toilet, tub/shower, car and furniture Locomotion:  ambulation and stairs  Additional Impairments:  Social Cognition   problem solving, memory, attention and awareness  Anticipated Outcomes Item Anticipated Outcome  Eating/Swallowing    Basic self-care  Mod I  Tolieting  Mod I  Bowel/Bladder    Transfers  Mod I  Locomotion  Mod I  Communication  Mod I  Cognition  Mod I  Pain  Pt denies pain  Safety/Judgment  Pt will call for assist, remain safe and free of falls during admission  Other  Incisions to chest will heal without signs and symptoms of infection, skin will remain intact   Therapy Plan:   OT Frequency: 2-3 X/day, 60-90 minutes SLP Frequency: 1-2 X/day, 30-60 minutes   Team Interventions: Item RN PT OT SLP SW TR Other  Self Care/Advanced ADL Retraining   x      Neuromuscular Re-Education  x       Therapeutic Activities  x x x     UE/LE Strength Training/ROM  x       UE/LE Coordination Activities  x x      Visual/Perceptual Remediation/Compensation         DME/Adaptive Equipment Instruction  x x      Therapeutic Exercise  x x      Balance/Vestibular Training  x x      Patient/Family Education x x x x     Cognitive Remediation/Compensation   x x     Functional Mobility Training  x x      Ambulation/Gait Training  x x      Stair Training  x       Wheelchair Propulsion/Positioning  x x      Functional Tourist information centre manager Reintegration  x x        Dysphagia/Aspiration Film/video editor         Bladder Management x        Bowel Management x        Disease Management/Prevention         Pain Management x x       Medication Management         Skin Care/Wound Management x  x      Splinting/Orthotics         Discharge Planning  x x x     Psychosocial Support    x                        Team Discharge Planning: Destination:  Home Projected Follow-up:  PT and Outpatient Projected Equipment Needs:  None Patient/family involved in discharge planning:  Yes  MD ELOS: one week Medical Rehab Prognosis:  Good Assessment: Pt admitted for CIR therapies. The team will be addressing self-care, fxnl mobility, strength, balance, adaptive equipment. Progress somewhat side tracked by calf DVT. Goals are mod I.

## 2012-02-29 NOTE — Progress Notes (Signed)
  Echocardiogram 2D Echocardiogram has been performed.  [name] 02/29/2012, 11:11 AM

## 2012-02-29 NOTE — Progress Notes (Signed)
Physical Therapy Treatment Patient Details Name: [name] MRN: [id number] DOB: 1950-08-30 Today's Date: 02/29/2012 Time: 1324-4010 PT Time Calculation (min): 13 min  PT Assessment / Plan / Recommendation Comments on Treatment Session  Pt adm for emergent TAA repair.  Pt continues to make steady progress.  Pt continues with deficits in strength, endurance, cognition, and mobility.  To transfer to rehab today.    Follow Up Recommendations  Inpatient Rehab    Barriers to Discharge        Equipment Recommendations  Rolling walker with 5" wheels;3 in 1 bedside comode    Recommendations for Other Services    Frequency Min 3X/week   Plan Discharge plan remains appropriate;Frequency remains appropriate    Precautions / Restrictions Precautions Precautions: Sternal;Fall   Pertinent Vitals/Pain N/A    Mobility  Transfers Sit to Stand: 4: Min assist;From bed;With upper extremity assist Stand to Sit: 4: Min assist;With upper extremity assist;To bed Details for Transfer Assistance: Cues for minimal use of hands when performing transfer. Ambulation/Gait Ambulation/Gait Assistance: 4: Min assist Ambulation Distance (Feet): 175 Feet Assistive device: Rolling walker Ambulation/Gait Assistance Details: Verbal cues to stand more erect. Gait Pattern: Step-through pattern;Decreased stride length;Left genu recurvatum;Right genu recurvatum;Trunk flexed Gait velocity: decr    Exercises     PT Diagnosis:    PT Problem List:   PT Treatment Interventions:     PT Goals Acute Rehab PT Goals PT Goal: Sit to Stand - Progress: Progressing toward goal PT Goal: Stand to Sit - Progress: Progressing toward goal PT Goal: Ambulate - Progress: Progressing toward goal  Visit Information  Last PT Received On: 02/29/12 Assistance Needed: +1    Subjective Data  Subjective: Pt asking appropriate questions about rehab.   Cognition  Overall Cognitive Status: Impaired Arousal/Alertness:  Awake/alert Behavior During Session: WFL for tasks performed Cognition - Other Comments: Slightly slowed processing    Balance  Static Standing Balance Static Standing - Balance Support: Bilateral upper extremity supported Static Standing - Level of Assistance: 5: Stand by assistance  End of Session PT - End of Session Activity Tolerance: Patient tolerated treatment well Patient left: in bed;with call bell/phone within reach;with family/visitor present Nurse Communication: Mobility status   GP     Ivan Lacher 02/29/2012, 1:58 PM  [employer] PT [phone]

## 2012-02-29 NOTE — Plan of Care (Signed)
Problem: RH SKIN INTEGRITY Goal: RH STG ABLE TO PERFORM INCISION/WOUND CARE W/ASSISTANCE STG Able To Perform Incision/Wound Care With Assistance.  Pt will be able to verbalize and state the signs and symptoms of infection with minimal assist  Problem: RH SAFETY Goal: RH STG ADHERE TO SAFETY PRECAUTIONS W/ASSISTANCE/DEVICE STG Adhere to Safety Precautions With Assistance/Device.  Pt  Will call for assist and make needs known with minimal assist

## 2012-02-29 NOTE — Progress Notes (Signed)
Pt d/c to CIR, report given to [employer].

## 2012-02-29 NOTE — Progress Notes (Signed)
Noted PA's note that pt ready for CIR today. Met with pt and his daughter, Donnald Garre who are in agreement with plan. Have received preauthorization from [facility] for CIR admission. Please call for questions: [phone].

## 2012-02-29 NOTE — Progress Notes (Signed)
Pt admitted to room 4038 from 2018, pt alert and oriented, daughter present at bedside. Pt and daughter educated on rehab unit, safety plan, rehab schedule and call bell, pt and daughter verbalized an understanding, see [facility] for full assessment, pt denies pain, call bell in reach

## 2012-02-29 NOTE — Progress Notes (Signed)
Discussed with pt and daughters ed, including smoking cessation and DM. Pt interested in [facility] and requests his name be sent to [place] [facility]. Wants to quit smoking. Gave resources. [phone] Ethelda Chick CES, ACSM

## 2012-03-01 ENCOUNTER — Inpatient Hospital Stay (HOSPITAL_COMMUNITY): Payer: 59 | Admitting: Physical Therapy

## 2012-03-01 ENCOUNTER — Inpatient Hospital Stay (HOSPITAL_COMMUNITY): Payer: 59 | Admitting: Speech Pathology

## 2012-03-01 ENCOUNTER — Encounter (HOSPITAL_COMMUNITY): Payer: Self-pay | Admitting: General Practice

## 2012-03-01 ENCOUNTER — Inpatient Hospital Stay (HOSPITAL_COMMUNITY): Payer: 59 | Admitting: Occupational Therapy

## 2012-03-01 DIAGNOSIS — I7101 Dissection of thoracic aorta: Secondary | ICD-10-CM

## 2012-03-01 DIAGNOSIS — Z5189 Encounter for other specified aftercare: Secondary | ICD-10-CM

## 2012-03-01 DIAGNOSIS — G9341 Metabolic encephalopathy: Secondary | ICD-10-CM

## 2012-03-01 DIAGNOSIS — M79609 Pain in unspecified limb: Secondary | ICD-10-CM

## 2012-03-01 LAB — GLUCOSE, CAPILLARY
Glucose-Capillary: 191 mg/dL — ABNORMAL HIGH (ref 70–99)
Glucose-Capillary: 135 mg/dL — ABNORMAL HIGH (ref 70–99)

## 2012-03-01 LAB — COMPREHENSIVE METABOLIC PANEL
Albumin: 2.7 g/dL — ABNORMAL LOW (ref 3.5–5.2)
BUN: 19 mg/dL (ref 6–23)
Creatinine, Ser: 0.85 mg/dL (ref 0.50–1.35)
GFR calc Af Amer: >90 mL/min (ref >90)
Glucose, Bld: 163 mg/dL — ABNORMAL HIGH (ref 70–99)
Total Bilirubin: 0.5 mg/dL (ref 0.3–1.2)
Total Protein: 6 g/dL (ref 6.0–8.3)

## 2012-03-01 LAB — CBC WITH DIFFERENTIAL/PLATELET
Basophils Relative: 0 % (ref 0–1)
Eosinophils Absolute: 0.3 10*3/uL (ref 0.0–0.7)
HCT: 27.7 % — ABNORMAL LOW (ref 39.0–52.0)
Hemoglobin: 9.1 g/dL — ABNORMAL LOW (ref 13.0–17.0)
MCH: 31 pg (ref 26.0–34.0)
MCHC: 32.9 g/dL (ref 30.0–36.0)
MCV: 94.2 fL (ref 78.0–100.0)
Monocytes Absolute: 1 10*3/uL (ref 0.1–1.0)
Monocytes Relative: 9 % (ref 3–12)

## 2012-03-01 MED ORDER — COUMADIN BOOK
Freq: Once | Status: AC
  Administered 2012-03-01: 18:00:00
  Filled 2012-03-01: qty 1

## 2012-03-01 MED ORDER — WARFARIN SODIUM 7.5 MG PO TABS
7.5000 mg | ORAL_TABLET | Freq: Once | ORAL | Status: AC
  Administered 2012-03-01: 7.5 mg via ORAL
  Filled 2012-03-01: qty 1

## 2012-03-01 MED ORDER — ENOXAPARIN SODIUM 120 MG/0.8ML ~~LOC~~ SOLN
1.0000 mg/kg | Freq: Two times a day (BID) | SUBCUTANEOUS | Status: DC
  Filled 2012-03-01 (×2): qty 0.8

## 2012-03-01 MED ORDER — WARFARIN - PHARMACIST DOSING INPATIENT
Freq: Every day | Status: DC
  Administered 2012-03-04 – 2012-03-05 (×2)

## 2012-03-01 MED ORDER — MUSCLE RUB 10-15 % EX CREA
TOPICAL_CREAM | CUTANEOUS | Status: DC | PRN
  Administered 2012-03-03: 11:00:00 via TOPICAL
  Filled 2012-03-01: qty 85

## 2012-03-01 MED ORDER — HEPARIN (PORCINE) IN NACL 100-0.45 UNIT/ML-% IJ SOLN
2500.0000 [IU]/h | INTRAMUSCULAR | Status: DC
  Administered 2012-03-01: 800 [IU]/h via INTRAVENOUS
  Administered 2012-03-02: 1900 [IU]/h via INTRAVENOUS
  Administered 2012-03-03: 2200 [IU]/h via INTRAVENOUS
  Administered 2012-03-03 – 2012-03-06 (×6): 2500 [IU]/h via INTRAVENOUS
  Filled 2012-03-01 (×17): qty 250

## 2012-03-01 MED ORDER — WARFARIN VIDEO: Freq: Once | Status: DC

## 2012-03-01 NOTE — Progress Notes (Signed)
Subjective/Complaints: Calves are sore. Slept fairly well. No sob or cp.  A 12 point review of systems has been performed and if not noted above is otherwise negative.   Objective: Vital Signs: Blood pressure 123/70, pulse 82, temperature 98.2 F (36.8 C), temperature source Oral, resp. rate 20, weight 104.4 kg (230 lb 2.6 oz), SpO2 97.00%. Dg Chest 2 View  02/29/2012  *RADIOLOGY REPORT*  Clinical Data: History of repair of thoracic aortic aneurysm, follow-up  CHEST - 2 VIEW  Comparison: Chest x-ray of 02/28/2012  Findings: No active infiltrate or effusion is seen.  A left upper extremity PICC line remains with tip seen to the mid SVC region. Cardiomegaly is stable.  IMPRESSION: No active lung disease.  Stable cardiomegaly.  No effusion.   Original Report Authenticated By: Juline Patch, M.D.    Ct Head Wo Contrast  02/28/2012  *RADIOLOGY REPORT*  Clinical Data: Open heart surgery.  Slow word finding and confusion.  CT HEAD WITHOUT CONTRAST  Technique:  Contiguous axial images were obtained from the base of the skull through the vertex without contrast.  Comparison: CT 02/19/2012  Findings: Ventricle size is normal.  Negative for acute infarct. Negative for hemorrhage or mass lesion.  Calvarium intact.  IMPRESSION: No acute abnormality and no interval change.   Original Report Authenticated By: Camelia Phenes, M.D.     Basename 03/01/12 0624 02/29/12 0600  WBC 11.0* 11.0*  HGB 9.1* 8.7*  HCT 27.7* 26.5*  PLT 229 177    Basename 02/29/12 0600 02/28/12 0420  NA 136 135  K 3.4* 3.8  CL 104 101  CO2 23 22  GLUCOSE 117* 135*  BUN 22 24*  CREATININE 0.78 0.74  CALCIUM 8.0* 8.5   CBG (last 3)   Basename 02/29/12 2111 02/29/12 1700 02/29/12 1121  GLUCAP 191* 101* 153*    Wt Readings from Last 3 Encounters:  03/01/12 104.4 kg (230 lb 2.6 oz)  02/29/12 103.465 kg (228 lb 1.6 oz)  02/29/12 103.465 kg (228 lb 1.6 oz)    Physical Exam:  HENT:  Head: Normocephalic.  Eyes:  Pupils  round and reactive to light , oral mucosa pink and moist  Neck: Neck supple. No thyromegaly present.  Cardiovascular: Normal rate and regular rhythm. No murmurs  Pulmonary/Chest: Breath sounds normal. No respiratory distress. He has no wheezes.  Abdominal: Bowel sounds are normal. He exhibits no distension. There is no tenderness.  Musculoskeletal: He exhibits edema. Mild calf tenderness, - Homan's sign Neurological: He is alert and oriented to person, place, and time. He has normal reflexes. CN normal Follows three-step commands. Thought processing much improved. . Mild weakness with biceps and wrist extension, 4/5. [name] Tingling on palmar 1st and 2nd fingers of right hand. Good sitting balance. General weakness in legs 3-4+ proximal weaker than distal. DTR's are grossly 1+  Skin:  Chest incision clean and dry, right shoulder incision intact as well.  Psychiatric: He has a normal mood and affect    Assessment/Plan: 1. Functional deficits secondary to AAA repair, deconditioning, encephalopathy, mild brachial plexus injury which require 3+ hours per day of interdisciplinary therapy in a comprehensive inpatient rehab setting. Physiatrist is providing close team supervision and 24 hour management of active medical problems listed below. Physiatrist and rehab team continue to assess barriers to discharge/monitor patient progress toward functional and medical goals. FIM:                   Comprehension Comprehension Mode: Auditory Comprehension: 5-Follows  basic conversation/direction: With no assist  Expression Expression Mode: Verbal Expression: 5-Expresses basic needs/ideas: With no assist  Social Interaction Social Interaction Mode: Asleep Social Interaction: 7-Interacts appropriately with others - No medications needed.  Problem Solving Problem Solving: 4-Solves basic 75 - 89% of the time/requires cueing 10 - 24% of the time  Memory Memory: 4-Recognizes or recalls 75 - 89%  of the time/requires cueing 10 - 24% of the time Medical Problem List and Plan:  1. Thoracic aortic aneurysm status post emergent repair with postop encephalopathy 02/20/2012  2. DVT Prophylaxis/Anticoagulation: SCDs when in bed. Patient is ambulatory  3. Pain Management: Vicodin as needed. Monitor with increased mobility  4. Mood/anxiety. Patient on Xanax in the past. Will monitor closely and provide emotional support  5. Neuropsych: This patient is capable of making decisions on his/her own behalf.  6. Postoperative anemia. Latest hemoglobin 9.6. Followup labs  7. Hypertension. Lopressor 25 mg twice a day. Monitor with increased mobility  8. Elevated blood sugars. Hemoglobin A1c of 7.5. Continue Lantus insulin 24 units daily. Patient on no diabetic agents prior to admission. He does family history of diabetes mellitus. Check blood sugars a.c. and at bedtime. Consider change to oral agents of possible  9. Asthma/tobacco abuse. Continue NicoDerm patch. Singulair 10 mg daily, Flonase daily. Provide counseling in regards to tobacco abuse  10. Hyperlipidemia. Lipitor   LOS (Days) 1 A FACE TO FACE EVALUATION WAS PERFORMED  Yuvia Plant T 03/01/2012, 7:02 AM

## 2012-03-01 NOTE — Progress Notes (Signed)
Results of leg duplex ultrasound noted-DVT of left leg. Noted plans to start anticoagulation for DVT. Patient is 9 days status post major thoracic aortic reconstruction for dissection and is at risk for bleeding. Would recommend goal for INR to be 2.0- 2.5. Recommend 2-D echocardiogram to be performed in 48-72 hours to evaluate for pericardial effusion. Baseline postop echocardiogram performed 2 days ago shows small pericardial effusion, no AI, good LV function.  This patient may need a vena caval filter  Use IV heparin drip without load while Coumadin is being initiated. Would recommend against using Pradaxa or another oral direct thrombin inhibitor.

## 2012-03-01 NOTE — Progress Notes (Signed)
Patient evaluated for long-term disease management services with [facility] Care Management Program as benefit of his [employer] insurance. Met with patient and daughter at bedside to discuss and explain Link to Wellness program and other benefits with [employer]. Left Link to Wellness packet at bedside. Will contact HR representative regarding FMLA on patient's behalf as well. [facility] Care Management services do not interfere or replace what is arranged by inpatient Social Work or Case Management. Mr Kisiel appreciative of visit.      Raiford Noble, MSN-Ed, RN,BSN [facility] Liaison [phone]

## 2012-03-01 NOTE — Evaluation (Signed)
Physical Therapy Assessment and Plan  Patient Details  Name: [name] MRN: [id number] Date of Birth: [birth year]  PT Diagnosis: Cognitive deficits, Difficulty walking and Muscle weakness Rehab Potential: Good ELOS: 7 days   Today's Date: 03/01/2012 Time: 800-900 60 minutes Problem List:  Patient Active Problem List  Diagnosis  . UNSPECIFIED VITAMIN D DEFICIENCY  . HYPERLIPIDEMIA  . OBSTRUCTIVE SLEEP APNEA  . MIGRAINE HEADACHE  . ALLERGIC RHINITIS  . ASTHMA  . ERECTILE DYSFUNCTION, ORGANIC  . Hypogonadism male  . Acute encephalopathy    Past Medical History:  Past Medical History  Diagnosis Date  . OBSTRUCTIVE SLEEP APNEA 02/2008 sleep study  . MIGRAINE HEADACHE   . ERECTILE DYSFUNCTION, ORGANIC   . Unspecified vitamin D deficiency   . HYPERLIPIDEMIA   . ASTHMA   . ALLERGIC RHINITIS    Past Surgical History:  Past Surgical History  Procedure Date  . Nasal fx without repair   . Thoracic aortic aneurysm repair 02/20/2012    Procedure: THORACIC ASCENDING ANEURYSM REPAIR (AAA);  Surgeon: Kerin Perna, MD;  Location: [facility] OR;  Service: Open Heart Surgery;  Laterality: N/A;    Assessment & Plan Clinical Impression: Patient is a 61 y.o. year old male with recent admission to the hospital on 02/20/2012 with transient symptoms of headache difficulty with speech and leg weakness. Head CT scan was performed that was negative. He started having chest pain and a CT scan was performed rule out pulmonary embolism. A second CTA of the thoracic and abdominal aorta demonstrated a type A dissection with the origin and the asymmetry aorta above the aortic valve continuing down just above the aortic bifurcation. Underwent emergent repair of thoracic aortic dissection 02/20/2012 per Dr. Donata Clay. Noted bouts of hypoxia patient refusing oxygen at times.  Patient transferred to CIR on 02/29/2012 .   Patient currently requires min with mobility secondary to muscle weakness, decreased  cardiorespiratoy endurance and decreased standing balance and decreased balance strategies.  Prior to hospitalization, patient was independent with mobility and lived with Alone in a House home.  Home access is 2Stairs to enter.  Patient will benefit from skilled PT intervention to maximize safe functional mobility, minimize fall risk and decrease caregiver burden for planned discharge home with intermittent assist.  Anticipate patient will not need PT follow up at discharge.  PT - End of Session Activity Tolerance: Tolerates 30+ min activity with multiple rests Endurance Deficit: Yes Endurance Deficit Description: requires frequent seated rests PT Assessment Rehab Potential: Good PT Plan PT Frequency: 1-2 X/day, 60-90 minutes Estimated Length of Stay: 7 days PT Treatment/Interventions: Ambulation/gait training;Community reintegration;Discharge planning;Balance/vestibular training;Cognitive remediation/compensation;Functional mobility training;Therapeutic Activities;UE/LE Coordination activities;Patient/family education;DME/adaptive equipment instruction;Neuromuscular re-education;Therapeutic Exercise;UE/LE Strength taining/ROM;Pain management;Wheelchair propulsion/positioning;Stair training;Splinting/orthotics  PT Evaluation Precautions/Restrictions Precautions Precautions: Fall;Sternal Restrictions Weight Bearing Restrictions: No  Vital Signs Therapy Vitals Pulse Rate: 82  (with activity) Pain Pain Assessment Pain Assessment: No/denies pain Home Living/Prior Functioning Home Living Lives With: Alone Available Help at Discharge: Family Type of Home: House Home Access: Stairs to enter Secretary/administrator of Steps: 2 Entrance Stairs-Rails: None Home Layout: Able to live on main level with bedroom/bathroom Prior Function Level of Independence: Independent with basic ADLs;Independent with gait;Independent with transfers Able to Take Stairs?: Yes Driving: Yes Vocation: Full  time employment  Cognition Overall Cognitive Status: Impaired Awareness: Impaired Awareness Impairment: Anticipatory impairment Behaviors: Impulsive Safety/Judgment: Impaired Sensation Sensation Light Touch: Impaired by gross assessment (B feet and hands) Proprioception: Impaired by gross assessment Coordination Gross Motor Movements  are Fluid and Coordinated: Yes Motor  Motor Motor: Within Functional Limits  Mobility Transfers Sit to Stand: 5: Supervision Stand Pivot Transfers: 4: Min assist Stand Pivot Transfer Details (indicate cue type and reason): steadying assist Locomotion  Ambulation Ambulation: Yes Ambulation/Gait Assistance: 4: Min assist Ambulation Distance (Feet): 25 Feet Assistive device: None Ambulation/Gait Assistance Details: steadying assist for balance.  Pt with increased sway, decreased step/stride length, decreased cadence Stairs / Additional Locomotion Stairs: Yes Stairs Assistance: 4: Min assist Stairs Assistance Details (indicate cue type and reason): steadying assist Stair Management Technique: Two rails Number of Stairs: 5   Trunk/Postural Assessment  Cervical Assessment Cervical Assessment: Within Functional Limits Thoracic Assessment Thoracic Assessment:  (sternal precautions) Lumbar Assessment Lumbar Assessment: Within Functional Limits Postural Control Postural Control: Within Functional Limits  Balance Berg Balance Test Sit to Stand: Able to stand without using hands and stabilize independently Standing Unsupported: Able to stand safely 2 minutes Sitting with Back Unsupported but Feet Supported on Floor or Stool: Able to sit safely and securely 2 minutes Stand to Sit: Sits safely with minimal use of hands Transfers: Able to transfer safely, definite need of hands Standing Unsupported with Eyes Closed: Able to stand 10 seconds with supervision Standing Ubsupported with Feet Together: Able to place feet together independently and stand  for 1 minute with supervision From Standing, Reach Forward with Outstretched Arm: Reaches forward but needs supervision From Standing Position, Pick up Object from Floor: Able to pick up shoe, needs supervision From Standing Position, Turn to Look Behind Over each Shoulder: Looks behind from both sides and weight shifts well Turn 360 Degrees: Needs close supervision or verbal cueing Standing Unsupported, Alternately Place Feet on Step/Stool: Able to complete >2 steps/needs minimal assist Standing Unsupported, One Foot in Front: Needs help to step but can hold 15 seconds Standing on One Leg: Tries to lift leg/unable to hold 3 seconds but remains standing independently Total Score: 37  Extremity Assessment      RLE Assessment RLE Assessment: Within Functional Limits LLE Assessment LLE Assessment: Within Functional Limits  See FIM for current functional status Refer to Care Plan for Long Term Goals  Recommendations for other services: None  Discharge Criteria: Patient will be discharged from PT if patient refuses treatment 3 consecutive times without medical reason, if treatment goals not met, if there is a change in medical status, if patient makes no progress towards goals or if patient is discharged from hospital.  The above assessment, treatment plan, treatment alternatives and goals were discussed and mutually agreed upon: by patient  Treatment initiated during session: Gait training without AD stop/start and backward walking, pt without LOB but required steadying assist.  Berg balance test performed, pt scored 37/56 which is 80% risk of falls.  Pt educated on fall risk and importance of maintaining safety here and at home, pt expresses understanding.  Paarth Cropper 03/01/2012, 5:48 PM

## 2012-03-01 NOTE — Evaluation (Signed)
This note has been reviewed and this clinician agrees with information provided.  

## 2012-03-01 NOTE — Evaluation (Signed)
Occupational Therapy Assessment and Plan  Patient Details  Name: [name] MRN: [id number] Date of Birth: [birth year]  OT Diagnosis: cognitive deficits and muscle weakness (generalized) Rehab Potential: Rehab Potential: Good ELOS: 7-10 days   Today's Date: 03/01/2012 Time: 1030-1130 Time Calculation (min): 60 min  OT Eval initiated, role, purpose, and goals of OT discussed with pt and daughter.  Self care retraining: Session focused on functional mobility, static and dynamic standing balance, ability to identify and adhere to sternal precautions during functional tasks, bathing and dressing. Pt bathed at shower level using tub bench, ambulated from BR door into walk-in shower with steady assist. Transferred from shower to w/c in BR with steady assist and completed dressing with supervision. Sit<>stands for bathing periarea/buttocks and clothing management with steady A for standing balance due to decreased sensation in B feet. Pt able to identify sternal precautions with Min A, however, requires Mod-Max verbal cues for adherence to precautions during tasks due to impaired selective attention and working memory. Pt daughter present for session.  Problem List:  Patient Active Problem List  Diagnosis  . UNSPECIFIED VITAMIN D DEFICIENCY  . HYPERLIPIDEMIA  . OBSTRUCTIVE SLEEP APNEA  . MIGRAINE HEADACHE  . ALLERGIC RHINITIS  . ASTHMA  . ERECTILE DYSFUNCTION, ORGANIC  . Hypogonadism male  . Acute encephalopathy    Past Medical History:  Past Medical History  Diagnosis Date  . OBSTRUCTIVE SLEEP APNEA 02/2008 sleep study  . MIGRAINE HEADACHE   . ERECTILE DYSFUNCTION, ORGANIC   . Unspecified vitamin D deficiency   . HYPERLIPIDEMIA   . ASTHMA   . ALLERGIC RHINITIS    Past Surgical History:  Past Surgical History  Procedure Date  . Nasal fx without repair   . Thoracic aortic aneurysm repair 02/20/2012    Procedure: THORACIC ASCENDING ANEURYSM REPAIR (AAA);  Surgeon: Kerin Perna, MD;  Location: [facility] OR;  Service: Open Heart Surgery;  Laterality: N/A;    Assessment & Plan Clinical Impression: Patient is a 61 y.o. year old right-handed male with obstructive sleep apnea, tobacco abuse. 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 Admitted 02/20/2012 with transient symptoms of headache difficulty with speech and leg weakness. Head CT scan was performed that was negative. He started having chest pain and a CT scan was performed rule out pulmonary embolism. A second CTA of the thoracic and abdominal aorta demonstrated a type A dissection with the origin and the asymmetry aorta above the aortic valve continuing down just above the aortic bifurcation. Underwent emergent repair of thoracic aortic dissection 02/20/2012 per Dr. Donata Clay. Noted bouts of hypoxia patient refusing oxygen at times. Followup speech therapy questionable dysphagia currently maintained on a clear liquid diet and since advanced to a regular consistency. Patient with some elevated blood sugar readings with hemoglobin A1c of 7.5 he is presently on Lantus insulin and monitored. Postoperative anemia 7.5-8.9 and monitored. A mediastinal chest tube has been in place and vancomycin was initiated prophylactically. His chest tube has since been removed and all antibiotics discontinued with pacing wires also removed 02/29/2012. Patient with noted bouts of confusion as well his recall of his hospitalizations events suspect encephalopathy.   Patient currently requires min with basic self-care skills secondary to muscle weakness, decreased cardiorespiratoy endurance, decreased attention, decreased awareness, decreased problem solving, decreased safety awareness, decreased memory and delayed processing and decreased standing balance and difficulty maintaining precautions.  Prior to hospitalization, patient could complete ADLs/IADLs with [employer].  Patient will benefit  from skilled  intervention to increase independence with basic self-care skills prior to discharge home independently with family available prn. Anticipate patient will require intermittent supervision and possible candidate for pulmonary cardiac rehab.  OT - End of Session Activity Tolerance: Tolerates 30+ min activity with multiple rests Endurance Deficit: Yes Endurance Deficit Description: increased fatigue and respiratory rate with self care tasks OT Assessment Rehab Potential: Good Barriers to Discharge: None OT Plan OT Frequency: 2-3 X/day, 60-90 minutes Estimated Length of Stay: 7-10 days OT Treatment/Interventions: Balance/vestibular training;Cognitive remediation/compensation;Community reintegration;Discharge planning;DME/adaptive equipment instruction;Functional mobility training;Patient/family education;Self Care/advanced ADL retraining;Skin care/wound managment;Therapeutic Activities;UE/LE Coordination activities OT Recommendation Follow Up Recommendations: None (possible candidate for pulmonary cardiac rehab)  OT Evaluation Precautions/Restrictions  Precautions Precautions: Sternal;Fall Precaution Comments: Able to state sternal precautions with min vc, c/o decreased sensation in feet Restrictions Weight Bearing Restrictions: No General Chart Reviewed: Yes Family/Caregiver Present: Yes (daughter) Pain Denies pain Home Living/Prior Functioning Home Living Lives With: Alone Available Help at Discharge: Family;Friend(s) Type of Home: House Home Access: Stairs to enter [employer] of Steps: 1-2 Entrance Stairs-Rails: None Home Layout: Able to live on main level with bedroom/bathroom Bathroom Shower/Tub: Health visitor: Standard Bathroom Accessibility: Yes How Accessible: Accessible via walker Home Adaptive Equipment: Built-in shower seat IADL History Current License: Yes Occupation: Full time employment Prior Function Level of Independence:  Independent with basic ADLs Able to Take Stairs?: Yes Driving: Yes Vocation: Full time employment Leisure: Hobbies-yes (Comment) Comments: yard work, reading ADL  See FIM Vision/Perception  Vision - History Baseline Vision: Wears glasses only for reading Vision - Assessment Eye Alignment: Within Functional Limits Perception Perception: Impaired (secondary to decreased sensation in feet and R hand) Praxis Praxis: Intact  Cognition Overall Cognitive Status: Impaired Arousal/Alertness: Awake/alert Orientation Level: Oriented X4 Attention: Alternating Selective Attention: Appears intact Alternating Attention: Impaired Alternating Attention Impairment: Verbal complex;Functional complex Memory: Impaired Memory Impairment: Decreased recall of new information Decreased Short Term Memory: Verbal complex;Functional complex Awareness: Impaired Awareness Impairment: Anticipatory impairment Problem Solving: Impaired Problem Solving Impairment: Functional complex Executive Function: Self Monitoring;Self Correcting Organizing: Impaired Organizing Impairment: Functional complex Self Monitoring: Impaired Self Monitoring Impairment: Functional complex Self Correcting: Impaired Self Correcting Impairment: Functional complex Behaviors: Impulsive Safety/Judgment: Impaired Sensation Sensation Light Touch: Impaired by gross assessment (thumb, 2nd, and 3rd digit of R hand, B feet) Proprioception: Impaired by gross assessment Coordination Gross Motor Movements are Fluid and Coordinated: Yes Fine Motor Movements are Fluid and Coordinated: No Motor  Motor Motor: Within Functional Limits Mobility  Transfers Sit to Stand: 5: Supervision Stand to Sit: 5: Supervision  Trunk/Postural Assessment  Cervical Assessment Cervical Assessment: Within Functional Limits Thoracic Assessment Thoracic Assessment: Exceptions to [facility] (secondary to surgical site and sternal precautions ) Lumbar  Assessment Lumbar Assessment: Within Functional Limits Postural Control Postural Control: Within Functional Limits  Balance Standardized Balance Assessment Standardized Balance Assessment: Berg Balance Test Berg Balance Test Sit to Stand: Able to stand without using hands and stabilize independently Standing Unsupported: Able to stand safely 2 minutes Sitting with Back Unsupported but Feet Supported on Floor or Stool: Able to sit safely and securely 2 minutes Stand to Sit: Sits safely with minimal use of hands Transfers: Able to transfer safely, definite need of hands Standing Unsupported with Eyes Closed: Able to stand 10 seconds with supervision Standing Ubsupported with Feet Together: Able to place feet together independently and stand for 1 minute with supervision From Standing, Reach Forward with Outstretched Arm: Reaches forward but needs supervision From Standing Position,  Pick up Object from Floor: Able to pick up shoe, needs supervision From Standing Position, Turn to Look Behind Over each Shoulder: Looks behind from both sides and weight shifts well Turn 360 Degrees: Needs close supervision or verbal cueing Standing Unsupported, Alternately Place Feet on Step/Stool: Able to complete >2 steps/needs minimal assist Standing Unsupported, One Foot in Front: Needs help to step but can hold 15 seconds Standing on One Leg: Tries to lift leg/unable to hold 3 seconds but remains standing independently Total Score: 37  Static Standing Balance Static Standing - Balance Support: During functional activity Static Standing - Level of Assistance: 5: Stand by assistance Extremity/Trunk Assessment RUE Assessment RUE Assessment: Within Functional Limits LUE Assessment LUE Assessment: Within Functional Limits  See FIM for current functional status Refer to Care Plan for Long Term Goals  Recommendations for other services: None  Discharge Criteria: Patient will be discharged from OT if  patient refuses treatment 3 consecutive times without medical reason, if treatment goals not met, if there is a change in medical status, if patient makes no progress towards goals or if patient is discharged from hospital.  The above assessment, treatment plan, treatment alternatives and goals were discussed and mutually agreed upon: by patient and by family (daughter)  Jackelyn Poling 03/01/2012, 11:59 AM

## 2012-03-01 NOTE — Progress Notes (Signed)
*  Preliminary Results* Bilateral lower extremity venous duplex completed. The left lower extremity is positive for deep vein thrombosis involving the left posterior tibial veins. Preliminary results discussed with RN Noreene Larsson.  03/01/2012 3:18 PM Gertie Fey, RDMS, RDCS

## 2012-03-01 NOTE — Evaluation (Signed)
Speech Language Pathology Assessment and Plan  Patient Details  Name: [name] MRN: [id number] Date of Birth: 1950-12-11  SLP Diagnosis: Cognitive Impairments  Rehab Potential: Excellent ELOS: 1 week   Today's Date: 03/01/2012 Time: 1914-7829 Time Calculation (min): 55 min  Skilled Therapeutic Intervention: Administered cognitive-linguistic evaluation. Please see below for details.   Problem List:  Patient Active Problem List  Diagnosis  . UNSPECIFIED VITAMIN D DEFICIENCY  . HYPERLIPIDEMIA  . OBSTRUCTIVE SLEEP APNEA  . MIGRAINE HEADACHE  . ALLERGIC RHINITIS  . ASTHMA  . ERECTILE DYSFUNCTION, ORGANIC  . Hypogonadism male  . Acute encephalopathy   Past Medical History:  Past Medical History  Diagnosis Date  . OBSTRUCTIVE SLEEP APNEA 02/2008 sleep study  . MIGRAINE HEADACHE   . ERECTILE DYSFUNCTION, ORGANIC   . Unspecified vitamin D deficiency   . HYPERLIPIDEMIA   . ASTHMA   . ALLERGIC RHINITIS    Past Surgical History:  Past Surgical History  Procedure Date  . Nasal fx without repair   . Thoracic aortic aneurysm repair 02/20/2012    Procedure: THORACIC ASCENDING ANEURYSM REPAIR (AAA);  Surgeon: Kerin Perna, MD;  Location: [facility] OR;  Service: Open Heart Surgery;  Laterality: N/A;    Assessment / Plan / Recommendation Clinical Impression  61 y.o. right-handed male with obstructive sleep apnea, tobacco abuse. 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 Head CT scan was performed that was negative. He started having chest pain and a CT scan was performed rule out pulmonary embolism. A second CTA of the thoracic and abdominal aorta demonstrated a type A dissection with the origin and the asymmetry aorta above the aortic valve continuing down just above the aortic bifurcation. Underwent emergent repair of thoracic aortic  dissection 02/20/2012 per Dr. Donata Clay. Noted bouts of hypoxia patient refusing oxygen at times. Followup speech therapy questionable dysphagia currently maintained on a clear liquid diet and since advanced to a regular consistency. Patient with some elevated blood sugar readings with hemoglobin A1c of 7.5 he is presently on Lantus insulin and monitored. Postoperative anemia 7.5-8.9 and monitored. A mediastinal chest tube has been in place and vancomycin was initiated prophylactically. His chest tube has since been removed and all antibiotics discontinued with pacing wires also removed 02/29/2012. Patient with noted bouts of confusion as well his recall of his hospitalizations events suspect encephalopathy. Physical therapy ongoing for deconditioning request physical medicine rehabilitation consult to consider inpatient rehabilitation services. Patient was felt to be acute candidate for inpatient rehabilitation services and was admitted for comprehensive rehabilitation program. Pt transferred to CIR 02/29/12 and presents with mild higher-level cognitive impairments characterized by decreased organization of complex information, decreased complex problem solving and decreased working memory. Pt would benefit from skilled SLP intervention to maximize cognitive function and overall independence for discharge home.     SLP Assessment  Patient will need skilled Speech Lanaguage Pathology Services during CIR admission    Recommendations  Follow up Recommendations:  (TBD)    SLP Frequency 1-2 X/day, 30-60 minutes   SLP Treatment/Interventions Cognitive remediation/compensation;Cueing hierarchy;Internal/external aids;Environmental controls;Functional tasks;Patient/family education;Therapeutic Activities    Pain No/Denies Pain  Short Term Goals: Week 1: SLP Short Term Goal 1 (Week 1): Pt will demonstrate complex problem solving with supervision verbal cues.  SLP Short Term Goal 2 (Week 1): Pt will utilize  external memory aids to increase recall/carryover of new information with  Min verbal and visual cues.  SLP Short Term Goal 3 (Week 1): Pt will express complex wants/needs with Mod I.   See FIM for current functional status Refer to Care Plan for Long Term Goals  Recommendations for other services: None  Discharge Criteria: Patient will be discharged from SLP if patient refuses treatment 3 consecutive times without medical reason, if treatment goals not met, if there is a change in medical status, if patient makes no progress towards goals or if patient is discharged from hospital.  The above assessment, treatment plan, treatment alternatives and goals were discussed and mutually agreed upon: by patient and by family  Pavielle Biggar 03/01/2012, 10:13 AM

## 2012-03-01 NOTE — Progress Notes (Signed)
02/29/12 Patient reported bilateral lower extremity pain at hs. Patient given 1 vicodin for pain. Right arm apraxic with decreased sensation to third, fourth, and fifth fingers. A. Takila Kronberg,LPN

## 2012-03-01 NOTE — Progress Notes (Addendum)
ANTICOAGULATION CONSULT NOTE - Initial Consult  Pharmacy Consult for Lovenox Indication: DVT  Allergies  Allergen Reactions  . Testosterone Shortness Of Breath    Medication:Androgel Pt states trouble breathing at night    Patient Measurements: Weight: 230 lb 2.6 oz (104.4 kg) Heparin dosing weight: 102kg :   Vital Signs: Temp: 98.2 F (36.8 C) (09/18 0510) Temp src: Oral (09/18 0510) BP: 123/70 mmHg (09/18 0510) Pulse Rate: 82  (09/18 0510)  Labs:  Basename 03/01/12 0624 02/29/12 0600 02/28/12 0420  HGB 9.1* 8.7* --  HCT 27.7* 26.5* 28.6*  PLT 229 177 149*  APTT -- -- --  LABPROT -- -- --  INR -- -- --  HEPARINUNFRC -- -- --  CREATININE 0.85 0.78 0.74  CKTOTAL -- -- --  CKMB -- -- --  TROPONINI -- -- --    The CrCl is unknown because both a height and weight (above a minimum accepted value) are required for this calculation.   Medical History: Past Medical History  Diagnosis Date  . OBSTRUCTIVE SLEEP APNEA 02/2008 sleep study  . MIGRAINE HEADACHE   . ERECTILE DYSFUNCTION, ORGANIC   . Unspecified vitamin D deficiency   . HYPERLIPIDEMIA   . ASTHMA   . ALLERGIC RHINITIS     Medications:  Prescriptions prior to admission  Medication Sig Dispense Refill  . ALPRAZolam (XANAX) 1 MG tablet Take 1 mg by mouth at bedtime as needed. For insomnia      . aspirin 81 MG chewable tablet Chew 1 tablet (81 mg total) by mouth daily.      . cholecalciferol (VITAMIN D) 1000 UNITS tablet Take 2,000 Units by mouth daily.      . Cyanocobalamin (VITAMIN B-12 PO) Take 1 tablet by mouth daily.      [name] EPINEPHrine (EPI-PEN) 0.3 mg/0.3 mL DEVI Inject 0.3 mg into the muscle as needed. For anaphylaxis       . ezetimibe-simvastatin (VYTORIN) 10-40 MG per tablet Take 0.5 tablets by mouth daily.      . fexofenadine (ALLEGRA) 180 MG tablet Take 180 mg by mouth daily.        . fluticasone (FLONASE) 50 MCG/ACT nasal spray Place 2 sprays into the nose daily.      [name]  HYDROcodone-acetaminophen (NORCO/VICODIN) 5-325 MG per tablet Take 1 tablet by mouth every 4 (four) hours as needed.  30 tablet  0  . metoprolol tartrate (LOPRESSOR) 25 MG tablet Take 1 tablet (25 mg total) by mouth 2 (two) times daily.      . montelukast (SINGULAIR) 10 MG tablet Take 10 mg by mouth daily.      [name] omega-3 acid ethyl esters (LOVAZA) 1 G capsule Take 1 g by mouth 2 (two) times daily.      . predniSONE (DELTASONE) 5 MG tablet Take 5 mg by mouth daily as needed. For asthma flare up      . rosuvastatin (CRESTOR) 10 MG tablet Take 10 mg by mouth daily.      [name] tretinoin (RETIN-A) 0.1 % cream Apply 1 application topically daily as needed. For acne or rash        Assessment: 61 year old man with newly found DVT in LLE to start on therapeutic anticoagulation with Lovenox.  Patient discussed with Delle Reining PA-C - will not start warfarin this evening.  Also asked if Xarelto would be an option here. Goal of Therapy:  Anti-Xa level 0.6-1.2 units/ml 4hrs after LMWH dose given Monitor platelets by anticoagulation protocol: Yes  Plan:  Lovenox 105mg  sq q12 CBC q72h while on Lovenox. Timing of warfarin initiation per MD.  It would not be too late to switch to Xarelto if desired.  Mickeal Skinner 03/01/2012,3:45 PM  Addendum:  Received a call from Delle Reining, PA-C who discussed the situation with Dr. Donata Clay.  The plan now is to start IV heparin without a bolus at 800 units/hr and adjust to a goal heparin level of 0.3-0.7.  Will also initiate warfarin this evening. Plan:  Heparin drip - 800 units/hr.  Check heparin level 6 hours after infusion begun.  Daily CBC and heparin level while on heparin.. Warfarin 7.5mg  po x 1 dose - daily protimes starting tomorrow.  Goal INR = 2-3.  Celedonio Miyamoto, PharmD, BCPS Clinical Pharmacist Pager [phone]

## 2012-03-02 ENCOUNTER — Inpatient Hospital Stay (HOSPITAL_COMMUNITY): Payer: 59 | Admitting: *Deleted

## 2012-03-02 ENCOUNTER — Encounter (HOSPITAL_COMMUNITY): Payer: 59 | Admitting: Occupational Therapy

## 2012-03-02 ENCOUNTER — Inpatient Hospital Stay (HOSPITAL_COMMUNITY): Payer: 59 | Admitting: Speech Pathology

## 2012-03-02 DIAGNOSIS — I309 Acute pericarditis, unspecified: Secondary | ICD-10-CM

## 2012-03-02 DIAGNOSIS — I803 Phlebitis and thrombophlebitis of lower extremities, unspecified: Secondary | ICD-10-CM

## 2012-03-02 LAB — CBC
HCT: 26.5 % — ABNORMAL LOW (ref 39.0–52.0)
HCT: 27.5 % — ABNORMAL LOW (ref 39.0–52.0)
MCH: 30.7 pg (ref 26.0–34.0)
MCV: 93.6 fL (ref 78.0–100.0)
MCV: 94.5 fL (ref 78.0–100.0)
Platelets: 227 10*3/uL (ref 150–400)
RBC: 2.83 MIL/uL — ABNORMAL LOW (ref 4.22–5.81)
RBC: 2.91 MIL/uL — ABNORMAL LOW (ref 4.22–5.81)
RDW: 14.5 % (ref 11.5–15.5)
WBC: 10 10*3/uL (ref 4.0–10.5)

## 2012-03-02 LAB — HEPARIN LEVEL (UNFRACTIONATED): Heparin Unfractionated: <0.1 IU/mL — ABNORMAL LOW (ref 0.30–0.70)

## 2012-03-02 LAB — GLUCOSE, CAPILLARY
Glucose-Capillary: 115 mg/dL — ABNORMAL HIGH (ref 70–99)
Glucose-Capillary: 150 mg/dL — ABNORMAL HIGH (ref 70–99)
Glucose-Capillary: 137 mg/dL — ABNORMAL HIGH (ref 70–99)

## 2012-03-02 MED ORDER — LIDOCAINE 5 % EX PTCH
2.0000 | MEDICATED_PATCH | CUTANEOUS | Status: DC
  Administered 2012-03-02 – 2012-03-07 (×6): 2 via TRANSDERMAL
  Filled 2012-03-02 (×9): qty 2

## 2012-03-02 MED ORDER — SODIUM CHLORIDE 0.9 % IJ SOLN
10.0000 mL | INTRAMUSCULAR | Status: DC | PRN
  Administered 2012-03-03 – 2012-03-04 (×2): 10 mL
  Administered 2012-03-06: 20 mL
  Administered 2012-03-07 – 2012-03-08 (×2): 10 mL

## 2012-03-02 MED ORDER — ALUM & MAG HYDROXIDE-SIMETH 200-200-20 MG/5ML PO SUSP: 30.0000 mL | Freq: Four times a day (QID) | ORAL | Status: DC | PRN

## 2012-03-02 MED ORDER — WARFARIN SODIUM 7.5 MG PO TABS
7.5000 mg | ORAL_TABLET | Freq: Once | ORAL | Status: AC
  Administered 2012-03-02: 7.5 mg via ORAL
  Filled 2012-03-02: qty 1

## 2012-03-02 NOTE — Progress Notes (Signed)
Occupational Therapy Note  Patient Details  Name: [name] MRN: [id number] Date of Birth: Oct 27, 1950 Today's Date: 03/02/2012  Time In:  11:30 Time Out:  11:54.  Patient states he has low back pain from the soft bed but RN had just put lidocaine patch on and patient stated it was helping. Also stated he has chest pain only when he coughs.  Patient on bedside therapies only today due to large DVT in LLE.  Patient did not wish to dress in street clothes. Treatment focused on bed mobility, increasing tolerance to activity, incorporating sternal precautions into bed mobility, patient education.  Patient quickly fatigued asked session to end early.  Patient missed 6 minutes of therapy.    Norton Pastel 03/02/2012, 11:56 AM

## 2012-03-02 NOTE — Progress Notes (Addendum)
ANTICOAGULATION CONSULT NOTE  Pharmacy Consult for Heparin/Coumadin Indication: DVT  Allergies  Allergen Reactions  . Testosterone Shortness Of Breath    Medication:Androgel Pt states trouble breathing at night    Patient Measurements: Weight: 230 lb 2.6 oz (104.4 kg) Heparin dosing weight: 102kg :   Vital Signs: BP: 110/72 mmHg (09/18 2102) Pulse Rate: 73  (09/18 2102)  Labs:  Basename 03/02/12 0115 03/01/12 0624 02/29/12 0600 02/28/12 0420  HGB 8.7* 9.1* -- --  HCT 26.5* 27.7* 26.5* --  PLT 227 229 177 --  APTT -- -- -- --  LABPROT 14.4 -- -- --  INR 1.14 -- -- --  HEPARINUNFRC <0.10* -- -- --  CREATININE -- 0.85 0.78 0.74  CKTOTAL -- -- -- --  CKMB -- -- -- --  TROPONINI -- -- -- --    The CrCl is unknown because both a height and weight (above a minimum accepted value) are required for this calculation.  Assessment: 61 year old male DVT s/p AAA repair 9/8 for Heparin  Goal of Therapy:  Heparin level 0.3-0.7 INR 2-2.5 Monitor platelets by anticoagulation protocol: Yes   Plan:  Increase Heparin 1600 units/hr Check heparin level in 8 hours. Coumadin 7.5 mg again this evening  Eddie Candle 03/02/2012,2:54 AM

## 2012-03-02 NOTE — Progress Notes (Signed)
ANTICOAGULATION CONSULT NOTE - Follow Up Consult  Pharmacy Consult for Heparin Indication: DVT  Allergies  Allergen Reactions  . Testosterone Shortness Of Breath    Medication:Androgel Pt states trouble breathing at night    Patient Measurements: Weight: 230 lb 2.6 oz (104.4 kg) Heparin Dosing Weight: 102 Kg  Vital Signs: Temp: 98.1 F (36.7 C) (09/19 0543) Temp src: Oral (09/19 0543) BP: 122/71 mmHg (09/19 0543) Pulse Rate: 80  (09/19 0543)  Labs:  Basename 03/02/12 1355 03/02/12 0115 03/01/12 0624 02/29/12 0600  HGB 8.8* 8.7* -- --  HCT 27.5* 26.5* 27.7* --  PLT 253 227 229 --  APTT -- -- -- --  LABPROT -- 14.4 -- --  INR -- 1.14 -- --  HEPARINUNFRC <0.10* <0.10* -- --  CREATININE -- -- 0.85 0.78  CKTOTAL -- -- -- --  CKMB -- -- -- --  TROPONINI -- -- -- --    The CrCl is unknown because both a height and weight (above a minimum accepted value) are required for this calculation.   Medications:  Prescriptions prior to admission  Medication Sig Dispense Refill  . ALPRAZolam (XANAX) 1 MG tablet Take 1 mg by mouth at bedtime as needed. For insomnia      . aspirin 81 MG chewable tablet Chew 1 tablet (81 mg total) by mouth daily.      . cholecalciferol (VITAMIN D) 1000 UNITS tablet Take 2,000 Units by mouth daily.      . Cyanocobalamin (VITAMIN B-12 PO) Take 1 tablet by mouth daily.      [name] EPINEPHrine (EPI-PEN) 0.3 mg/0.3 mL DEVI Inject 0.3 mg into the muscle as needed. For anaphylaxis       . ezetimibe-simvastatin (VYTORIN) 10-40 MG per tablet Take 0.5 tablets by mouth daily.      . fexofenadine (ALLEGRA) 180 MG tablet Take 180 mg by mouth daily.        . fluticasone (FLONASE) 50 MCG/ACT nasal spray Place 2 sprays into the nose daily.      [name] HYDROcodone-acetaminophen (NORCO/VICODIN) 5-325 MG per tablet Take 1 tablet by mouth every 4 (four) hours as needed.  30 tablet  0  . metoprolol tartrate (LOPRESSOR) 25 MG tablet Take 1 tablet (25 mg total) by mouth 2 (two)  times daily.      . montelukast (SINGULAIR) 10 MG tablet Take 10 mg by mouth daily.      [name] omega-3 acid ethyl esters (LOVAZA) 1 G capsule Take 1 g by mouth 2 (two) times daily.      . predniSONE (DELTASONE) 5 MG tablet Take 5 mg by mouth daily as needed. For asthma flare up      . rosuvastatin (CRESTOR) 10 MG tablet Take 10 mg by mouth daily.      [name] tretinoin (RETIN-A) 0.1 % cream Apply 1 application topically daily as needed. For acne or rash        Assessment: 61 year old male with a DVT s/p AAA repair 9/8 continues on Heparin & Coumadin. Noted MD instructions to anticoagulate cautiously d/t risk of pericardial hemorrhage. H/H and plts have remained stable, but heparin level is undetectable again.  Goal of Therapy:  Heparin level 0.3-0.7 units/ml INR 2-2.5 Monitor platelets by anticoagulation protocol: Yes   Plan:  - Increase heparin drip to 1900 units/hr (12ml/hr) - Check heparin level at 2100 today (6h) - will f/up Daily heparin level and CBC - Will f/up anticoag plans after 2D echo  Mirna Mires K 03/02/2012,2:52 PM

## 2012-03-02 NOTE — Progress Notes (Signed)
ANTICOAGULATION CONSULT NOTE - Follow Up Consult  Pharmacy Consult for Heparin Indication: DVT  Allergies  Allergen Reactions  . Testosterone Shortness Of Breath    Medication:Androgel Pt states trouble breathing at night    Patient Measurements: Weight: 230 lb 2.6 oz (104.4 kg) Heparin Dosing Weight: 102 Kg  Vital Signs: Temp: 97.5 F (36.4 C) (09/19 1818) Temp src: Oral (09/19 1818) BP: 108/70 mmHg (09/19 2046) Pulse Rate: 78  (09/19 2046)  Labs:  Basename 03/02/12 2109 03/02/12 1355 03/02/12 0115 03/01/12 0624 02/29/12 0600  HGB -- 8.8* 8.7* -- --  HCT -- 27.5* 26.5* 27.7* --  PLT -- 253 227 229 --  APTT -- -- -- -- --  LABPROT -- -- 14.4 -- --  INR -- -- 1.14 -- --  HEPARINUNFRC <0.10* <0.10* <0.10* -- --  CREATININE -- -- -- 0.85 0.78  CKTOTAL -- -- -- -- --  CKMB -- -- -- -- --  TROPONINI -- -- -- -- --    The CrCl is unknown because both a height and weight (above a minimum accepted value) are required for this calculation.   Medications:  Prescriptions prior to admission  Medication Sig Dispense Refill  . ALPRAZolam (XANAX) 1 MG tablet Take 1 mg by mouth at bedtime as needed. For insomnia      . aspirin 81 MG chewable tablet Chew 1 tablet (81 mg total) by mouth daily.      . cholecalciferol (VITAMIN D) 1000 UNITS tablet Take 2,000 Units by mouth daily.      . Cyanocobalamin (VITAMIN B-12 PO) Take 1 tablet by mouth daily.      [name] EPINEPHrine (EPI-PEN) 0.3 mg/0.3 mL DEVI Inject 0.3 mg into the muscle as needed. For anaphylaxis       . ezetimibe-simvastatin (VYTORIN) 10-40 MG per tablet Take 0.5 tablets by mouth daily.      . fexofenadine (ALLEGRA) 180 MG tablet Take 180 mg by mouth daily.        . fluticasone (FLONASE) 50 MCG/ACT nasal spray Place 2 sprays into the nose daily.      [name] HYDROcodone-acetaminophen (NORCO/VICODIN) 5-325 MG per tablet Take 1 tablet by mouth every 4 (four) hours as needed.  30 tablet  0  . metoprolol tartrate (LOPRESSOR) 25 MG  tablet Take 1 tablet (25 mg total) by mouth 2 (two) times daily.      . montelukast (SINGULAIR) 10 MG tablet Take 10 mg by mouth daily.      [name] omega-3 acid ethyl esters (LOVAZA) 1 G capsule Take 1 g by mouth 2 (two) times daily.      . predniSONE (DELTASONE) 5 MG tablet Take 5 mg by mouth daily as needed. For asthma flare up      . rosuvastatin (CRESTOR) 10 MG tablet Take 10 mg by mouth daily.      [name] tretinoin (RETIN-A) 0.1 % cream Apply 1 application topically daily as needed. For acne or rash        Assessment: 61 year old male with a DVT s/p AAA repair 9/8 continues on Heparin & Coumadin. Noted MD instructions to anticoagulate cautiously d/t risk of pericardial hemorrhage. H/H and plts have remained stable, but heparin level is undetectable again.  Goal of Therapy:  Heparin level 0.3-0.7 units/ml INR 2-2.5 Monitor platelets by anticoagulation protocol: Yes   Plan:  - Increase heparin drip to 2200 units/hr (33ml/hr) - Check heparin level at 0430 - Will f/up anticoag plans after 2D echo  Talbert Cage  [place] 03/02/2012,10:23 PM

## 2012-03-02 NOTE — Progress Notes (Signed)
Subjective/Complaints: Calves are sore. Slept fairly well. No sob or cp.  A 12 point review of systems has been performed and if not noted above is otherwise negative.   Objective: Vital Signs: Blood pressure 122/71, pulse 80, temperature 98.1 F (36.7 C), temperature source Oral, resp. rate 19, weight 104.4 kg (230 lb 2.6 oz), SpO2 97.00%. Dg Chest 2 View  02/29/2012  *RADIOLOGY REPORT*  Clinical Data: History of repair of thoracic aortic aneurysm, follow-up  CHEST - 2 VIEW  Comparison: Chest x-ray of 02/28/2012  Findings: No active infiltrate or effusion is seen.  A left upper extremity PICC line remains with tip seen to the mid SVC region. Cardiomegaly is stable.  IMPRESSION: No active lung disease.  Stable cardiomegaly.  No effusion.   Original Report Authenticated By: Juline Patch, M.D.     Basename 03/02/12 0115 03/01/12 0624  WBC 11.4* 11.0*  HGB 8.7* 9.1*  HCT 26.5* 27.7*  PLT 227 229    Basename 03/01/12 0624 02/29/12 0600  NA 136 136  K 4.1 3.4*  CL 103 104  CO2 25 23  GLUCOSE 163* 117*  BUN 19 22  CREATININE 0.85 0.78  CALCIUM 8.6 8.0*   CBG (last 3)   Basename 03/01/12 2148 03/01/12 1703 03/01/12 1110  GLUCAP 100* 204* 135*    Wt Readings from Last 3 Encounters:  03/01/12 104.4 kg (230 lb 2.6 oz)  02/29/12 103.465 kg (228 lb 1.6 oz)  02/29/12 103.465 kg (228 lb 1.6 oz)    Physical Exam:  HENT:  Head: Normocephalic.  Eyes:  Pupils round and reactive to light , oral mucosa pink and moist  Neck: Neck supple. No thyromegaly present.  Cardiovascular: Normal rate and regular rhythm. No murmurs  Pulmonary/Chest: Breath sounds normal. No respiratory distress. He has no wheezes.  Abdominal: Bowel sounds are normal. He exhibits no distension. There is no tenderness.  Musculoskeletal: He exhibits edema. Mild calf tenderness, low back tenderness ato L5-S1.  Neurological: He is alert and oriented to person, place, and time. He has normal reflexes. CN  normal Follows three-step commands. Thought processing much improved. . Mild weakness with biceps and wrist extension, 4/5. [name] Tingling on palmar 1st and 2nd fingers of right hand. Good sitting balance. General weakness in legs 3-4+ proximal weaker than distal. DTR's are grossly 1+  Skin:  Chest incision clean and dry, right shoulder incision intact as well.  Psychiatric: He has a normal mood and affect    Assessment/Plan: 1. Functional deficits secondary to AAA repair, deconditioning, encephalopathy, mild brachial plexus injury which require 3+ hours per day of interdisciplinary therapy in a comprehensive inpatient rehab setting. Physiatrist is providing close team supervision and 24 hour management of active medical problems listed below. Physiatrist and rehab team continue to assess barriers to discharge/monitor patient progress toward functional and medical goals. FIM: FIM - Bathing Bathing Steps Patient Completed: Chest;Right Arm;Left Arm;Abdomen;Front perineal area;Buttocks;Right upper leg;Left upper leg;Right lower leg (including foot);Left lower leg (including foot) Bathing: 4: Steadying assist  FIM - Upper Body Dressing/Undressing Upper body dressing/undressing steps patient completed: Thread/unthread right sleeve of pullover shirt/dresss;Thread/unthread left sleeve of pullover shirt/dress;Put head through opening of pull over shirt/dress;Pull shirt over trunk;Thread/unthread right sleeve of front closure shirt/dress;Thread/unthread left sleeve of front closure shirt/dress;Pull shirt around back of front closure shirt/dress Upper body dressing/undressing: 5: Supervision: Safety issues/verbal cues FIM - Lower Body Dressing/Undressing Lower body dressing/undressing steps patient completed: Thread/unthread right underwear leg;Thread/unthread left underwear leg;Pull underwear up/down;Thread/unthread right pants leg;Thread/unthread left  pants leg;Pull pants up/down;Fasten/unfasten  pants;Don/Doff right sock;Don/Doff left sock Lower body dressing/undressing: 4: Steadying Assist        FIM - Bed/Chair Transfer Bed/Chair Transfer: 4: Bed > Chair or W/C: Min A (steadying Pt. > 75%)  FIM - Locomotion: Wheelchair Locomotion: Wheelchair: 1: Total Assistance/staff pushes wheelchair (Pt<25%) FIM - Locomotion: Ambulation Ambulation/Gait Assistance: 4: Min assist Locomotion: Ambulation: 1: Travels less than 50 ft with minimal assistance (Pt.>75%)  Comprehension Comprehension Mode: Auditory Comprehension: 5-Understands complex 90% of the time/Cues < 10% of the time  Expression Expression Mode: Verbal Expression: 5-Expresses basic 90% of the time/requires cueing < 10% of the time.  Social Interaction Social Interaction Mode: Asleep Social Interaction: 5-Interacts appropriately 90% of the time - Needs monitoring or encouragement for participation or interaction.  Problem Solving Problem Solving: 5-Solves basic 90% of the time/requires cueing < 10% of the time  Memory Memory: 3-Recognizes or recalls 50 - 74% of the time/requires cueing 25 - 49% of the time Medical Problem List and Plan:  1. Thoracic aortic aneurysm status post emergent repair with postop encephalopathy 02/20/2012  2. DVT Prophylaxis/Anticoagulation: left posterior tibial vein DVT- iv heparin to coumadin. Check serial labs. Echo per surgery  3. Pain Management: Vicodin as needed. Monitor with increased mobility   -add lidoderm patches  -kpad  -has hx of low back pain PTA 4. Mood/anxiety. Patient on Xanax in the past. Will monitor closely and provide emotional support  5. Neuropsych: This patient is capable of making decisions on his/her own behalf.  6. Postoperative anemia. Latest hemoglobin 9.6. Followup labs  7. Hypertension. Lopressor 25 mg twice a day. Monitor with increased mobility  8. Elevated blood sugars. Hemoglobin A1c of 7.5. Continue Lantus insulin 24 units daily. Patient on no diabetic  agents prior to admission. He does family history of diabetes mellitus. Check blood sugars a.c. and at bedtime. Consider change to oral agents of possible  9. Asthma/tobacco abuse. Continue NicoDerm patch. Singulair 10 mg daily, Flonase daily. Provide counseling in regards to tobacco abuse  10. Hyperlipidemia. Lipitor   LOS (Days) 2 A FACE TO FACE EVALUATION WAS PERFORMED  SWARTZ,ZACHARY T 03/02/2012, 7:21 AM

## 2012-03-02 NOTE — Progress Notes (Signed)
Speech Language Pathology Daily Session Note  Patient Details  Name: [name] MRN: [id number] Date of Birth: Jan 09, 1951  Today's Date: 03/02/2012 Time: 6295-2841 Time Calculation (min): 45 min  Short Term Goals: Week 1: SLP Short Term Goal 1 (Week 1): Pt will demonstrate complex problem solving with supervision verbal cues.  SLP Short Term Goal 2 (Week 1): Pt will utilize external memory aids to increase recall/carryover of new information with Min verbal and visual cues.  SLP Short Term Goal 3 (Week 1): Pt will express complex wants/needs with Mod I.   Skilled Therapeutic Interventions: Treatment focus on functional problem solving and working memory. Pt recalled important, daily information with Min question and semantic cues and required extra time for complex verbal expression and thought organization.  Pt given notebook to utilize note taking to increase recall of new, daily information.  Pt asking appropriate questions about new medications and utilized visual cues with Min verbal and question cues.    FIM:  Comprehension Comprehension Mode: Auditory Comprehension: 5-Understands complex 90% of the time/Cues < 10% of the time Expression Expression Mode: Verbal Expression: 5-Expresses basic 90% of the time/requires cueing < 10% of the time. Social Interaction Social Interaction: 5-Interacts appropriately 90% of the time - Needs monitoring or encouragement for participation or interaction. Problem Solving Problem Solving: 5-Solves basic 90% of the time/requires cueing < 10% of the time Memory Memory: 3-Recognizes or recalls 50 - 74% of the time/requires cueing 25 - 49% of the time FIM - Eating Eating Activity: 7: Complete independence:no helper  Pain No/Denies Pain  Therapy/Group: Individual Therapy  Libertie Hausler 03/02/2012, 6:11 PM

## 2012-03-02 NOTE — Progress Notes (Addendum)
Physical Therapy Session Note  Patient Details  Name: [name] MRN: [id number] Date of Birth: 1951/06/06  Today's Date: 03/02/2012 Time: 1405-1440 Time Calculation (min): 35 min   Skilled Therapeutic Interventions/Progress Updates:   AM tx cancelled due to bedrest orders secondary to LLE DVT. missed  PM tx: Bed rest orders clarified for bedside therapies, without LE strengthening. Tx focused on safe transfer training and bed mobility within sternal precautions. Pt was up in [facility] , having just used toilet upon arrival.   Stand-step transfer WC>bed with Min A for steadying and cues for hand placement across chest as pt tends to use UEs during all mobility.   Sit<>supine x3 for instruction logrolling and safe technique and set-up to reduce need for adjustment once in bed. Pt tends to lean back once 1/2 rolled and needs cues to avoid using UEs. Reinforced to pt not to pull overhead to reach [facility], but instead taught pt how to bed R LE to 1/2 bridge (DVT in LLE) and scoot hips up with UEs crossing chest.   Sitting EOB, pt tends to lean forward on bil UEs with flexed posture. Educated pt on importance of maintaining upright posture during healing process to reduce chest tightness, as well as to limit UE strain. Pt able to maintain posture x30sec only.   Educated pt at length about importance of maintaining all bedrest and sternal precautions to reduce risk of complications.       Therapy Documentation Precautions:  Precautions Precautions: Fall;Sternal Precaution Comments: Able to state sternal precautions with min vc, c/o decreased sensation in feet Restrictions Weight Bearing Restrictions: No   Pain:   No complaints   See FIM for current functional status  Therapy/Group: Individual Therapy  Virl Cagey, PT 03/02/2012, 2:50 PM

## 2012-03-02 NOTE — Progress Notes (Signed)
Patient information reviewed and entered into [employer] system by Yumiko Alkins, RN-BC, CRRN,  covering PPS Coordinator.  Information including coding and functional independence measure will be reviewed and updated through discharge.   

## 2012-03-02 NOTE — Progress Notes (Signed)
T. CTS progress note  DVT of left leg 10 days after repair of type A ascending thoracic dissection. Patient also with a newly diagnosed asymptomatic stable calcified 6 cm infrarenal abdominal aneurysm  Currently on IV heparin infusion monitored by Dr. of pharmacy. Coumadin slowly being loaded. There is a risk of bleeding in the pericardial space and anticoagulation should be carefully monitored and slowly progressed with a INR goal of 2.0 to 2.5  Repeat 2-D echocardiogram ordered for tomorrow proximal a 48 hours after initiation of anticoagulation. If pericardial effusion appears to be significantly increased then patient will probably need a vena cava filter and withdrawal of anticoagulation.

## 2012-03-03 ENCOUNTER — Inpatient Hospital Stay (HOSPITAL_COMMUNITY): Payer: 59 | Admitting: Occupational Therapy

## 2012-03-03 ENCOUNTER — Inpatient Hospital Stay (HOSPITAL_COMMUNITY): Payer: 59 | Admitting: Physical Therapy

## 2012-03-03 ENCOUNTER — Inpatient Hospital Stay (HOSPITAL_COMMUNITY): Payer: 59 | Admitting: Speech Pathology

## 2012-03-03 DIAGNOSIS — I319 Disease of pericardium, unspecified: Secondary | ICD-10-CM

## 2012-03-03 LAB — CBC
Platelets: 273 10*3/uL (ref 150–400)
RDW: 14.7 % (ref 11.5–15.5)
WBC: 10.3 10*3/uL (ref 4.0–10.5)

## 2012-03-03 LAB — GLUCOSE, CAPILLARY: Glucose-Capillary: 110 mg/dL — ABNORMAL HIGH (ref 70–99)

## 2012-03-03 MED ORDER — GLYBURIDE 5 MG PO TABS
5.0000 mg | ORAL_TABLET | Freq: Every day | ORAL | Status: DC
  Administered 2012-03-03 – 2012-03-07 (×5): 5 mg via ORAL
  Filled 2012-03-03 (×6): qty 1

## 2012-03-03 MED ORDER — INSULIN GLARGINE 100 UNIT/ML ~~LOC~~ SOLN
20.0000 [IU] | Freq: Every day | SUBCUTANEOUS | Status: DC
  Administered 2012-03-03 – 2012-03-06 (×3): 20 [IU] via SUBCUTANEOUS

## 2012-03-03 MED ORDER — WARFARIN SODIUM 7.5 MG PO TABS
7.5000 mg | ORAL_TABLET | Freq: Once | ORAL | Status: AC
  Administered 2012-03-03: 7.5 mg via ORAL
  Filled 2012-03-03: qty 1

## 2012-03-03 NOTE — Progress Notes (Signed)
*  PRELIMINARY RESULTS* Echocardiogram 2D Echocardiogram has been performed.  Jeryl Columbia 03/03/2012, 11:09 AM

## 2012-03-03 NOTE — Progress Notes (Signed)
This note has been reviewed and this clinician agrees with information provided.  

## 2012-03-03 NOTE — Progress Notes (Signed)
Occupational Therapy Session Note  Patient Details  Name: [name] MRN: [id number] Date of Birth: July 30, 1950  Today's Date: 03/03/2012 Time: 1115-1200 Time Calculation (min): 45 min  Short Term Goals: Week 1:  OT Short Term Goal 1 (Week 1): STG=LTG  Skilled Therapeutic Interventions/Progress Updates:    self care retraining: session focused on adherence to sternal precautions during functional activities, standing balance, and activity tolerance. Pt in bed upon arrival to room and "feeling discouraged" because he could not recall events that happened previously this AM, however, agreed to participate in therapy with significant other present for entire session. Performed bathing tasks at sink level due to continuous IV and sit<>stands for washing and clothing management. Stood for ~ 3 min during grooming tasks at sink before needing a rest break. Pt able to state sternal precautions when questioned, but continues to require Max verbal cues to adhere to them during functional tasks.  Therapy Documentation Precautions:  Precautions Precautions: Fall;Sternal Precaution Comments: Able to state sternal precautions with min vc, c/o decreased sensation in feet Restrictions Weight Bearing Restrictions: No Pain: Denies pain this AM See FIM for current functional status  Therapy/Group: Individual Therapy  Jackelyn Poling 03/03/2012, 12:08 PM

## 2012-03-03 NOTE — Progress Notes (Signed)
Physical Therapy Note  Patient Details  Name: BINYOMIN BRANN MRN: [id number] Date of Birth: Oct 17, 1950 Today's Date: 03/03/2012  Time: 800-900 60 minutes  No c/o pain.  Pt with increased fatigue, reports he "didn't sleep much" last night.  Gait training controlled environment with 2 LOB requiring min A to correct.  Dynamic gait training with head turns, stop/start, speed and direction changes with min A due to decreased/delayed balance reactions.  Standing balance/coordination activity with tapping 6'' step in various sequences.  Pt required min A for balance, able to correctly perform sequence without cuing >90% of the time.  Nu step for LE strength/endurance 5 mins, 3 mins at level 4.  Pt unable to complete full 10 minutes due to fatigue.  Pt required min cuing to maintain sternal precautions throughout session.  Individual therapy   Burlon Centrella 03/03/2012, 8:54 AM

## 2012-03-03 NOTE — Progress Notes (Signed)
Speech Language Pathology Daily Session Note  Patient Details  Name: [name] MRN: [id number] Date of Birth: 1950-12-11  Today's Date: 03/03/2012 Time: 0920-1000 Time Calculation (min): 40 min  Short Term Goals: Week 1: SLP Short Term Goal 1 (Week 1): Pt will demonstrate complex problem solving with supervision verbal cues.  SLP Short Term Goal 1 - Progress (Week 1): Progressing toward goal SLP Short Term Goal 2 (Week 1): Pt will utilize external memory aids to increase recall/carryover of new information with Min verbal and visual cues.  SLP Short Term Goal 2 - Progress (Week 1): Progressing toward goal SLP Short Term Goal 3 (Week 1): Pt will express complex wants/needs with Mod I.  SLP Short Term Goal 3 - Progress (Week 1): Progressing toward goal  Skilled Therapeutic Interventions: Treatment focused on problem-solving and recall through money-management/account balancing task.  Pt with chest pain during session; RN notified, pt repositioned.  Pain dissipated and pt reported he was comfortable again.  Required min verbal cues to review/recheck math calculations secondary to unidentified errors.  Pt expressed frustration over difficulty with task, given usual success.  Frustrated overall with slow progress and became emotional, verbalizing desire to go home.  Provided support.  Pt acknowledged decreased clarity in cognition and demonstrated adequate insight.       FIM:  Comprehension Comprehension Mode: Auditory Comprehension: 5-Understands complex 90% of the time/Cues < 10% of the time Expression Expression Mode: Verbal Expression: 5-Expresses complex 90% of the time/cues < 10% of the time Social Interaction Social Interaction: 5-Interacts appropriately 90% of the time - Needs monitoring or encouragement for participation or interaction. Problem Solving Problem Solving: 5-Solves basic 90% of the time/requires cueing < 10% of the time Memory Memory: 3-Recognizes or recalls 50  - 74% of the time/requires cueing 25 - 49% of the time  Pain Pain Assessment Pain Assessment: 0-10 Pain Score:   8 Pain Type: Acute pain Pain Location: Chest Pain Onset: Sudden Patients Stated Pain Goal: 0 Pain Intervention(s): RN made aware;Repositioned  Therapy/Group: Individual Therapy  Blenda Mounts Laurice 03/03/2012, 12:11 PM

## 2012-03-03 NOTE — Progress Notes (Signed)
Subjective/Complaints: Right axillary/chest wall pain. No sob, palpitations A 12 point review of systems has been performed and if not noted above is otherwise negative.   Objective: Vital Signs: Blood pressure 137/72, pulse 81, temperature 98.1 F (36.7 C), temperature source Oral, resp. rate 18, weight 99.8 kg (220 lb 0.3 oz), SpO2 98.00%. No results found.  Basename 03/03/12 0415 03/02/12 1355  WBC 10.3 10.0  HGB 8.6* 8.8*  HCT 26.3* 27.5*  PLT 273 253    Basename 03/01/12 0624  NA 136  K 4.1  CL 103  CO2 25  GLUCOSE 163*  BUN 19  CREATININE 0.85  CALCIUM 8.6   CBG (last 3)   Basename 03/03/12 0723 03/02/12 2123 03/02/12 1737  GLUCAP 133* 137* 150*    Wt Readings from Last 3 Encounters:  03/03/12 99.8 kg (220 lb 0.3 oz)  02/29/12 103.465 kg (228 lb 1.6 oz)  02/29/12 103.465 kg (228 lb 1.6 oz)    Physical Exam:  HENT:  Head: Normocephalic.  Eyes:  Pupils round and reactive to light , oral mucosa pink and moist  Neck: Neck supple. No thyromegaly present.  Cardiovascular: Normal rate and regular rhythm. No murmurs  Pulmonary/Chest: Breath sounds normal. No respiratory distress. He has no wheezes.  Abdominal: Bowel sounds are normal. He exhibits no distension. There is no tenderness.  Musculoskeletal: He exhibits edema. Mild calf tenderness, low back tenderness ato L5-S1. Tenderness at right axilla.  Neurological: He is alert and oriented to person, place, and time. He has normal reflexes. CN normal Follows three-step commands. Thought processing much improved. . Mild weakness with biceps and wrist extension, 4/5. [name] Tingling on palmar 1st and 2nd fingers of right hand. Good sitting balance. General weakness in legs 3-4+ proximal weaker than distal. DTR's are grossly 1+  Skin:  Chest incision clean and dry, right shoulder incision intact as well.  Psychiatric: He has a normal mood and affect    Assessment/Plan: 1. Functional deficits secondary to AAA repair,  deconditioning, encephalopathy, mild brachial plexus injury which require 3+ hours per day of interdisciplinary therapy in a comprehensive inpatient rehab setting. Physiatrist is providing close team supervision and 24 hour management of active medical problems listed below. Physiatrist and rehab team continue to assess barriers to discharge/monitor patient progress toward functional and medical goals. FIM: FIM - Bathing Bathing Steps Patient Completed: Chest;Right Arm;Left Arm;Abdomen;Front perineal area;Buttocks Bathing: 3: Mod-Patient completes 5-7 33f 10 parts or 50-74% (pt on bedside therapies)  FIM - Upper Body Dressing/Undressing Upper body dressing/undressing steps patient completed: Thread/unthread right sleeve of pullover shirt/dresss;Thread/unthread left sleeve of pullover shirt/dress;Put head through opening of pull over shirt/dress;Pull shirt over trunk;Thread/unthread right sleeve of front closure shirt/dress;Thread/unthread left sleeve of front closure shirt/dress;Pull shirt around back of front closure shirt/dress Upper body dressing/undressing: 0: Wears gown/pajamas-no public clothing FIM - Lower Body Dressing/Undressing Lower body dressing/undressing steps patient completed: Thread/unthread right underwear leg;Thread/unthread left underwear leg;Pull underwear up/down;Thread/unthread right pants leg;Thread/unthread left pants leg;Pull pants up/down;Fasten/unfasten pants;Don/Doff right sock;Don/Doff left sock Lower body dressing/undressing: 0: Wears Oceanographer        FIM - Games developer Transfer: 5: Supine > Sit: Supervision (verbal cues/safety issues);5: Sit > Supine: Supervision (verbal cues/safety issues);4: Bed > Chair or W/C: Min A (steadying Pt. > 75%);4: Chair or W/C > Bed: Min A (steadying Pt. > 75%)  FIM - Locomotion: Wheelchair Locomotion: Wheelchair: 0: Activity did not occur FIM - Locomotion: Ambulation Ambulation/Gait  Assistance: 4: Min assist Locomotion: Ambulation: 0: Activity  did not occur  Comprehension Comprehension Mode: Auditory Comprehension: 5-Understands complex 90% of the time/Cues < 10% of the time  Expression Expression Mode: Verbal Expression: 5-Expresses basic 90% of the time/requires cueing < 10% of the time.  Social Interaction Social Interaction Mode: Asleep Social Interaction: 5-Interacts appropriately 90% of the time - Needs monitoring or encouragement for participation or interaction.  Problem Solving Problem Solving: 5-Solves basic 90% of the time/requires cueing < 10% of the time  Memory Memory: 3-Recognizes or recalls 50 - 74% of the time/requires cueing 25 - 49% of the time Medical Problem List and Plan:  1. Thoracic aortic aneurysm status post emergent repair with postop encephalopathy 02/20/2012  2. DVT Prophylaxis/Anticoagulation: left posterior tibial vein DVT- iv heparin to coumadin. Check serial labs. Echo per surgery  -activity as tolerated  3. Pain Management: Vicodin as needed. Monitor with increased mobility   -added lidoderm patches  -kpad  -has hx of low back pain PTA  -"chest pain" reported by RN was actually axillary, rib. rx as above 4. Mood/anxiety. Patient on Xanax in the past. Will monitor closely and provide emotional support   -eager to leave hospital 5. Neuropsych: This patient is capable of making decisions on his/her own behalf.  6. Postoperative anemia. Latest hemoglobin 9.6. Followup labs next week 7. Hypertension. Lopressor 25 mg twice a day. Monitor with increased mobility  8. Elevated blood sugars. Hemoglobin A1c of 7.5. Continue Lantus insulin 24 units daily.Will add glyburide. Try to wean lantus.  9. Asthma/tobacco abuse. Continue NicoDerm patch. Singulair 10 mg daily, Flonase daily. Provide counseling in regards to tobacco abuse  10. Hyperlipidemia. Lipitor   LOS (Days) 3 A FACE TO FACE EVALUATION WAS PERFORMED  Dalyla Chui  T 03/03/2012, 7:27 AM

## 2012-03-03 NOTE — Progress Notes (Signed)
ANTICOAGULATION CONSULT NOTE - Follow Up Consult  Pharmacy Consult for Heparin/Coumadin Indication: DVT  Allergies  Allergen Reactions  . Testosterone Shortness Of Breath    Medication:Androgel Pt states trouble breathing at night    Patient Measurements: Weight: 230 lb 2.6 oz (104.4 kg) Heparin Dosing Weight: 102 Kg  Vital Signs: Temp: 97.5 F (36.4 C) (09/19 1818) Temp src: Oral (09/19 1818) BP: 108/70 mmHg (09/19 2046) Pulse Rate: 78  (09/19 2046)  Labs:  Basename 03/03/12 0415 03/02/12 2109 03/02/12 1355 03/02/12 0115 03/01/12 0624 02/29/12 0600  HGB 8.6* -- 8.8* -- -- --  HCT 26.3* -- 27.5* 26.5* -- --  PLT 273 -- 253 227 -- --  APTT -- -- -- -- -- --  LABPROT 15.3* -- -- 14.4 -- --  INR 1.23 -- -- 1.14 -- --  HEPARINUNFRC 0.17* <0.10* <0.10* -- -- --  CREATININE -- -- -- -- 0.85 0.78  CKTOTAL -- -- -- -- -- --  CKMB -- -- -- -- -- --  TROPONINI -- -- -- -- -- --    The CrCl is unknown because both a height and weight (above a minimum accepted value) are required for this calculation.  Assessment: 61 year old male with a DVT s/p AAA repair 9/8  on Heparin & Coumadin.  Heparin infusing appropriately per RN.    Goal of Therapy:  Heparin level 0.3-0.7 units/ml INR 2-2.5 Monitor platelets by anticoagulation protocol: Yes   Plan:  Increase Heparin 2500 units/hr Check heparin level in 8 hours. Coumadin 7.5 mg again today F/U anticoagulation plans post-Echo.  Kassy Mcenroe, Gary Fleet 03/03/2012,5:36 AM

## 2012-03-03 NOTE — Progress Notes (Signed)
ANTICOAGULATION CONSULT NOTE - Follow Up Consult  Pharmacy Consult for Heparin/Coumadin Indication: DVT  Allergies  Allergen Reactions  . Testosterone Shortness Of Breath    Medication:Androgel Pt states trouble breathing at night    Patient Measurements: Weight: 220 lb 0.3 oz (99.8 kg) Heparin Dosing Weight: 102 Kg  Vital Signs: Temp: 98.1 F (36.7 C) (09/20 0500) Temp src: Oral (09/20 0500) BP: 140/70 mmHg (09/20 1015) Pulse Rate: 81  (09/20 0500)  Labs:  Basename 03/03/12 1357 03/03/12 0415 03/02/12 2109 03/02/12 1355 03/02/12 0115 03/01/12 0624  HGB -- 8.6* -- 8.8* -- --  HCT -- 26.3* -- 27.5* 26.5* --  PLT -- 273 -- 253 227 --  APTT -- -- -- -- -- --  LABPROT -- 15.3* -- -- 14.4 --  INR -- 1.23 -- -- 1.14 --  HEPARINUNFRC 0.51 0.17* <0.10* -- -- --  CREATININE -- -- -- -- -- 0.85  CKTOTAL -- -- -- -- -- --  CKMB -- -- -- -- -- --  TROPONINI -- -- -- -- -- --    The CrCl is unknown because both a height and weight (above a minimum accepted value) are required for this calculation.  Assessment: 61 year old male with a DVT s/p AAA repair 9/8  on Heparin & Coumadin.  Heparin level now 0.51 which is therapeutic.  INR starting to rise after 2 doses of warfarin  Goal of Therapy:  Heparin level 0.3-0.7 units/ml INR 2-2.5 Monitor platelets by anticoagulation protocol: Yes   Plan:  Continue Heparin at 2500 units/hr Check heparin level and CBC in am. Coumadin 7.5 mg again today   Mickeal Skinner 03/03/2012,3:19 PM

## 2012-03-03 NOTE — Progress Notes (Signed)
Occupational Therapy Session Note  Patient Details  Name: [name] MRN: [id number] Date of Birth: Nov 06, 1950  Today's Date: 03/03/2012 Time: 1300-1330 Time Calculation (min): 30 min  Short Term Goals: Week 1:  OT Short Term Goal 1 (Week 1): STG=LTG  Skilled Therapeutic Interventions/Progress Updates:   Session focused on ambulation without AD on uneven terrain and activity tolerance. Pt steady assist for balance during dynamic gait, stair training, and ambulated ~30 feet x3 before taking rest break. Improved ability to maintain sternal precautions with only Min verbal cues during functional mobility. Pt still experiencing tingling/numbness in thumb, index and middle fingers of R hand and B feet, stating it feels like he is walking on "balloons."  Therapy Documentation Precautions:  Precautions Precautions: Fall;Sternal Precaution Comments: Able to state sternal precautions with min vc, c/o decreased sensation in feet Restrictions Weight Bearing Restrictions: No Pain: C/o pain in calf that feels like cramping; nursing and MD aware of complaints See FIM for current functional status  Therapy/Group: Individual Therapy  Jackelyn Poling 03/03/2012, 1:40 PM

## 2012-03-03 NOTE — Progress Notes (Signed)
Patient informed nurse of having chest pain that was radiating to the right arm. 12 lead  EKG was performed and it showed a  Normal sinus rhythm. Vital sign was completed BP 132/72 , Pulse  81, Resp 18 temp 98.1. Harvel Ricks, PA and Dr Riley Kill was made aware.

## 2012-03-04 ENCOUNTER — Inpatient Hospital Stay (HOSPITAL_COMMUNITY): Payer: 59

## 2012-03-04 ENCOUNTER — Ambulatory Visit (HOSPITAL_COMMUNITY): Payer: 59

## 2012-03-04 ENCOUNTER — Inpatient Hospital Stay (HOSPITAL_COMMUNITY): Payer: 59 | Admitting: *Deleted

## 2012-03-04 DIAGNOSIS — G9341 Metabolic encephalopathy: Secondary | ICD-10-CM

## 2012-03-04 DIAGNOSIS — I7101 Dissection of thoracic aorta: Secondary | ICD-10-CM

## 2012-03-04 DIAGNOSIS — Z5189 Encounter for other specified aftercare: Secondary | ICD-10-CM

## 2012-03-04 LAB — CBC
HCT: 26.1 % — ABNORMAL LOW (ref 39.0–52.0)
MCH: 30.3 pg (ref 26.0–34.0)
MCV: 94.2 fL (ref 78.0–100.0)
Platelets: 241 10*3/uL (ref 150–400)
RBC: 2.77 MIL/uL — ABNORMAL LOW (ref 4.22–5.81)
RDW: 14.9 % (ref 11.5–15.5)
WBC: 10.8 10*3/uL — ABNORMAL HIGH (ref 4.0–10.5)

## 2012-03-04 LAB — GLUCOSE, CAPILLARY
Glucose-Capillary: 86 mg/dL (ref 70–99)
Glucose-Capillary: 95 mg/dL (ref 70–99)
Glucose-Capillary: 114 mg/dL — ABNORMAL HIGH (ref 70–99)

## 2012-03-04 MED ORDER — WARFARIN SODIUM 7.5 MG PO TABS
7.5000 mg | ORAL_TABLET | Freq: Once | ORAL | Status: AC
  Administered 2012-03-04: 7.5 mg via ORAL
  Filled 2012-03-04: qty 1

## 2012-03-04 NOTE — Progress Notes (Signed)
Physical Therapy Note  Patient Details  Name: [name] MRN: [id number] Date of Birth: [birth year] Today's Date: 03/04/2012  Patient reported not feeling well and missed 60 minutes of walking group in pm.   Arelia Longest M 03/04/2012, 3:29 PM

## 2012-03-04 NOTE — Progress Notes (Signed)
Physical Therapy Session Note  Patient Details  Name: [name] MRN: [id number] Date of Birth: 07-03-1950  Today's Date: 03/04/2012 Time: 1120-1210 Time Calculation (min): 50 min  Short Term Goals: Week 1:     Skilled Therapeutic Interventions/Progress Updates:  Patient supine to sit with supervision. Patient transferred bed to wheelchair stand pivot with min assist. Patient ambulated 240 feet and 260 feet with min assist on level tile surfaces. Patient used Wii bowling to work on dynamic standing balance.   Therapy Documentation Precautions:  Precautions Precautions: Fall;Sternal Precaution Comments: Able to state sternal precautions with min vc, c/o decreased sensation in feet Restrictions Weight Bearing Restrictions: No  Pain: Pain Assessment Pain Assessment: No/denies pain Pain Score:   2  Locomotion : Ambulation Ambulation/Gait Assistance: 4: Min guard   See FIM for current functional status  Therapy/Group: Individual Therapy  Alma Friendly 03/04/2012, 12:57 PM

## 2012-03-04 NOTE — Progress Notes (Signed)
Patient ID: [name], male   DOB: 1950-09-06, 61 y.o.   MRN: [id number] Subjective/Complaints: Right axillary/chest wall pain.Back pain intermittent  No sob, palpitations R hand tingling A 12 point review of systems has been performed and if not noted above is otherwise negative.   Objective: Vital Signs: Blood pressure 122/77, pulse 83, temperature 98.1 F (36.7 C), temperature source Oral, resp. rate 18, weight 109 kg (240 lb 4.8 oz), SpO2 98.00%. No results found.  Basename 03/04/12 0720 03/03/12 0415  WBC 10.8* 10.3  HGB 8.4* 8.6*  HCT 26.1* 26.3*  PLT 241 273   No results found for this basename: NA:2,K:2,CL:2,CO2:2,GLUCOSE:2,BUN:2,CREATININE:2,CALCIUM:2 in the last 72 hours CBG (last 3)   Basename 03/04/12 0725 03/04/12 0631 03/03/12 2138  GLUCAP 95 86 91    Wt Readings from Last 3 Encounters:  03/04/12 109 kg (240 lb 4.8 oz)  02/29/12 103.465 kg (228 lb 1.6 oz)  02/29/12 103.465 kg (228 lb 1.6 oz)    Physical Exam:  HENT:  Head: Normocephalic.  Eyes:  Pupils round and reactive to light , oral mucosa pink and moist  Neck: Neck supple. No thyromegaly present.  Cardiovascular: Normal rate and regular rhythm. No murmurs  Pulmonary/Chest: Breath sounds normal. No respiratory distress. He has no wheezes.  Abdominal: Bowel sounds are normal. He exhibits no distension. There is no tenderness.  Musculoskeletal: He exhibits edema. Mild calf tenderness, low back tenderness ato L5-S1. Tenderness at right axilla.  Neurological: He is alert and oriented to person, place, and time. He has normal reflexes. CN normal Follows three-step commands. Thought processing much improved. . Mild weakness with biceps and wrist extension, 4/5. [name] Tingling on palmar 1st and 2nd fingers of right hand. Good sitting balance. General weakness in legs 3-4+ proximal weaker than distal. DTR's are grossly 1+  Skin:  Chest incision clean and dry, right shoulder incision intact as well.    Psychiatric: He has a normal mood and affect    Assessment/Plan: 1. Functional deficits secondary to AAA repair, deconditioning, encephalopathy, mild brachial plexus injury which require 3+ hours per day of interdisciplinary therapy in a comprehensive inpatient rehab setting. Physiatrist is providing close team supervision and 24 hour management of active medical problems listed below. Physiatrist and rehab team continue to assess barriers to discharge/monitor patient progress toward functional and medical goals. FIM: FIM - Bathing Bathing Steps Patient Completed: Chest;Right Arm;Left Arm;Abdomen;Front perineal area;Buttocks;Right upper leg;Left upper leg;Right lower leg (including foot);Left lower leg (including foot) Bathing: 5: Supervision: Safety issues/verbal cues  FIM - Upper Body Dressing/Undressing Upper body dressing/undressing steps patient completed: Thread/unthread right sleeve of pullover shirt/dresss;Thread/unthread left sleeve of pullover shirt/dress;Put head through opening of pull over shirt/dress;Pull shirt over trunk Upper body dressing/undressing: 5: Supervision: Safety issues/verbal cues FIM - Lower Body Dressing/Undressing Lower body dressing/undressing steps patient completed: Thread/unthread right underwear leg;Thread/unthread left underwear leg;Pull underwear up/down;Thread/unthread right pants leg;Thread/unthread left pants leg;Pull pants up/down;Don/Doff right sock;Don/Doff left sock Lower body dressing/undressing: 5: Supervision: Safety issues/verbal cues        FIM - Bed/Chair Transfer Bed/Chair Transfer: 5: Supine > Sit: Supervision (verbal cues/safety issues);5: Sit > Supine: Supervision (verbal cues/safety issues);5: Bed > Chair or W/C: Supervision (verbal cues/safety issues);5: Chair or W/C > Bed: Supervision (verbal cues/safety issues)  FIM - Locomotion: Wheelchair Locomotion: Wheelchair: 1: Total Assistance/staff pushes wheelchair (Pt<25%) FIM -  Locomotion: Ambulation Ambulation/Gait Assistance: 4: Min assist Locomotion: Ambulation: 4: Travels 150 ft or more with minimal assistance (Pt.>75%)  Comprehension Comprehension Mode: Auditory Comprehension:  5-Understands complex 90% of the time/Cues < 10% of the time  Expression Expression Mode: Verbal Expression: 5-Expresses complex 90% of the time/cues < 10% of the time  Social Interaction Social Interaction Mode: Asleep Social Interaction: 5-Interacts appropriately 90% of the time - Needs monitoring or encouragement for participation or interaction.  Problem Solving Problem Solving: 5-Solves complex 90% of the time/cues < 10% of the time  Memory Memory: 4-Recognizes or recalls 75 - 89% of the time/requires cueing 10 - 24% of the time Medical Problem List and Plan:  1. Thoracic aortic aneurysm status post emergent repair with postop encephalopathy 02/20/2012  2. DVT Prophylaxis/Anticoagulation: left posterior tibial vein DVT- iv heparin to coumadin. Check serial labs. Echo per surgery  -activity as tolerated  3. Pain Management: Vicodin as needed. Monitor with increased mobility   -added lidoderm patches  -kpad  -has hx of low back pain PTA  -"chest pain" reported by RN was actually axillary, rib. rx as above 4. Mood/anxiety. Patient on Xanax in the past. Will monitor closely and provide emotional support   -eager to leave hospital 5. Neuropsych: This patient is capable of making decisions on his/her own behalf.  6. Postoperative anemia. Latest hemoglobin 9.6. Followup labs next week 7. Hypertension. Lopressor 25 mg twice a day. Monitor with increased mobility  8. Elevated blood sugars. Hemoglobin A1c of 7.5. Continue Lantus insulin 24 units daily.Will add glyburide. Try to wean lantus.  9. Asthma/tobacco abuse. Continue NicoDerm patch. Singulair 10 mg daily, Flonase daily. Provide counseling in regards to tobacco abuse  10. Hyperlipidemia. Lipitor 11. Possible mild brachial  plexopathy, sensory symptoms, may need outpt EMG if symptoms do not resolve  LOS (Days) 4 A FACE TO FACE EVALUATION WAS PERFORMED  Lainie Daubert E 03/04/2012, 9:30 AM

## 2012-03-04 NOTE — Progress Notes (Signed)
ANTICOAGULATION CONSULT NOTE - Follow Up Consult  Pharmacy Consult for Heparin/Coumadin Indication: DVT  Allergies  Allergen Reactions  . Testosterone Shortness Of Breath    Medication:Androgel Pt states trouble breathing at night    Patient Measurements: Weight: 240 lb 4.8 oz (109 kg) Heparin Dosing Weight: 102 Kg  Vital Signs: Temp: 98.1 F (36.7 C) (09/21 0500) Temp src: Oral (09/21 0500) BP: 122/77 mmHg (09/21 0500) Pulse Rate: 83  (09/21 0500)  Labs:  Basename 03/04/12 0720 03/03/12 1357 03/03/12 0415 03/02/12 1355 03/02/12 0115  HGB 8.4* -- 8.6* -- --  HCT 26.1* -- 26.3* 27.5* --  PLT 241 -- 273 253 --  APTT -- -- -- -- --  LABPROT 17.9* -- 15.3* -- 14.4  INR 1.52* -- 1.23 -- 1.14  HEPARINUNFRC 0.59 0.51 0.17* -- --  CREATININE -- -- -- -- --  CKTOTAL -- -- -- -- --  CKMB -- -- -- -- --  TROPONINI -- -- -- -- --    The CrCl is unknown because both a height and weight (above a minimum accepted value) are required for this calculation.  Assessment: 61 year old male with a DVT s/p AAA repair 9/8  on Heparin & Coumadin.  Heparin level now 0.59 which is therapeutic.  INR rising after 3 doses of warfarin.  Goal of Therapy:  Heparin level 0.3-0.7 units/ml INR 2-2.5 Monitor platelets by anticoagulation protocol: Yes   Plan:  Continue Heparin at 2500 units/hr Check heparin level and CBC in am. Coumadin 7.5 mg again today Daily protimes   Mickeal Skinner 03/04/2012,12:56 PM

## 2012-03-04 NOTE — Progress Notes (Signed)
Occupational Therapy Session Note  Patient Details  Name: [name] MRN: [id number] Date of Birth: 1950-07-26  Today's Date: 03/04/2012 Time:  -   0930-1015  (45 min)  1st session                  1530-1410  (40 min)  2nd session     Short Term Goals: Week 1:  OT Short Term Goal 1 (Week 1): STG=LTG Week 2:     Skilled Therapeutic Interventions/Progress Updates:    1st session:   Pt.engaged in bathing and dressing at EOB level due to fatigue and low hemoglobin.  Pt. Set up with bathing and dressing and needed assist with footies.  Transferred to recliner with supervision.    2nd session:  Engaged in standing balance without hand support for 6 minutes, 8 minutes with supervision.  Pt went to sit to stand with supervision.  Pt. Ambulated with IV and minimal assist from gym to his room.  Needed minimal verbal cues to walk with control and slow down.  LLE exhibited decreased dorsi flexion with stepping.  Pt reported he had decreased feedback in it.    Therapy Documentation Precautions:  Precautions Precautions: Fall;Sternal Precaution Comments: Able to state sternal precautions; c/o decreased sensation in feet Restrictions Weight Bearing Restrictions: No    Pain: Pain Assessment Pain Score:   4/10 low back pain  1st and 2nd session     See FIM for current functional status  Therapy/Group: Individual Therapy  Humberto Seals 03/04/2012, 10:11 AM

## 2012-03-05 ENCOUNTER — Inpatient Hospital Stay (HOSPITAL_COMMUNITY): Payer: 59 | Admitting: Physical Therapy

## 2012-03-05 LAB — CBC
MCH: 29.9 pg (ref 26.0–34.0)
MCHC: 31.7 g/dL (ref 30.0–36.0)
MCV: 94.5 fL (ref 78.0–100.0)
Platelets: 225 10*3/uL (ref 150–400)
RBC: 2.74 MIL/uL — ABNORMAL LOW (ref 4.22–5.81)
RDW: 15.1 % (ref 11.5–15.5)

## 2012-03-05 LAB — GLUCOSE, CAPILLARY: Glucose-Capillary: 65 mg/dL — ABNORMAL LOW (ref 70–99)

## 2012-03-05 LAB — PROTIME-INR: Prothrombin Time: 21.3 seconds — ABNORMAL HIGH (ref 11.6–15.2)

## 2012-03-05 LAB — HEPARIN LEVEL (UNFRACTIONATED): Heparin Unfractionated: 0.63 IU/mL (ref 0.30–0.70)

## 2012-03-05 MED ORDER — FERROUS SULFATE 220 (44 FE) MG/5ML PO ELIX
220.0000 mg | ORAL_SOLUTION | Freq: Two times a day (BID) | ORAL | Status: DC
  Administered 2012-03-05: 220 mg via ORAL
  Filled 2012-03-05 (×4): qty 5

## 2012-03-05 MED ORDER — WARFARIN SODIUM 5 MG PO TABS
5.0000 mg | ORAL_TABLET | Freq: Once | ORAL | Status: AC
  Administered 2012-03-05: 5 mg via ORAL
  Filled 2012-03-05: qty 1

## 2012-03-05 MED ORDER — MUSCLE RUB 10-15 % EX CREA: TOPICAL_CREAM | CUTANEOUS | Status: DC | PRN

## 2012-03-05 NOTE — Progress Notes (Signed)
ANTICOAGULATION CONSULT NOTE - Follow Up Consult  Pharmacy Consult for Heparin/Coumadin Indication: DVT  Allergies  Allergen Reactions  . Testosterone Shortness Of Breath    Medication:Androgel Pt states trouble breathing at night    Patient Measurements: Weight: 231 lb 11.3 oz (105.1 kg) Heparin Dosing Weight: 102 Kg  Vital Signs: Temp: 98 F (36.7 C) (09/22 0529) BP: 108/61 mmHg (09/22 0932) Pulse Rate: 80  (09/22 0932)  Labs:  [name] 03/05/12 0639 03/04/12 0720 03/03/12 1357 03/03/12 0415  HGB 8.2* 8.4* -- --  HCT 25.9* 26.1* -- 26.3*  PLT 225 241 -- 273  APTT -- -- -- --  LABPROT 21.3* 17.9* -- 15.3*  INR 1.93* 1.52* -- 1.23  HEPARINUNFRC 0.63 0.59 0.51 --  CREATININE -- -- -- --  CKTOTAL -- -- -- --  CKMB -- -- -- --  TROPONINI -- -- -- --    The CrCl is unknown because both a height and weight (above a minimum accepted value) are required for this calculation.  Assessment: 61 year old male with a DVT s/p AAA repair 9/8  on Heparin & Coumadin.  Heparin level now 0.63 which is therapeutic.  INR rising after 4 doses of warfarin.  Platelet count stable.  Goal of Therapy:  Heparin level 0.3-0.7 units/ml INR 2-2.5 Monitor platelets by anticoagulation protocol: Yes   Plan:  Continue Heparin at 2500 units/hr Check heparin level and CBC in am. Coumadin 5mg  today. Daily protimes Coumadin education done by me on 03/04/12.   Mickeal Skinner 03/05/2012,10:33 AM

## 2012-03-05 NOTE — Progress Notes (Signed)
Informed Dr. Wynn Banker about R underarm armpit pain 8/10 radiating towards his back.  Ordered muscle cream and heat pad PRN.  Will continue to monitor.  Pain also treated with vicodin and tylenol.  See MAR for more details.  [name] 7:38 PM 03/05/2012

## 2012-03-05 NOTE — Progress Notes (Signed)
Physical Therapy Note  Patient Details  Name: DEKOTA SHENK MRN: [id number] Date of Birth: Sep 06, 1950 Today's Date: 03/05/2012  Time: 1400-1500 60 minutes  No c/o pain.  Pt participated in group gait training session without AD with close supervision-min A for dynamic balance, obstacle negotiation and gait in controlled environment.  Supervision assist for stair negotiation.  Nu step x 10 minutes level 3 for LE strength and conditioning.  Group therapy   Luisangel Wainright 03/05/2012, 1:50 PM

## 2012-03-05 NOTE — Progress Notes (Signed)
Patient ID: [name], male   DOB: 07-Jan-1951, 61 y.o.   MRN: [id number] Subjective/Complaints: Right axillary/chest wall pain.Back pain intermittent  No sob, palpitations R hand tingling Lower abd discomfort, no nausea or vomiting A 12 point review of systems has been performed and if not noted above is otherwise negative.   Objective: Vital Signs: Blood pressure 108/64, pulse 81, temperature 98 F (36.7 C), temperature source Oral, resp. rate 19, weight 105.1 kg (231 lb 11.3 oz), SpO2 98.00%. No results found.  Basename 03/05/12 0639 03/04/12 0720  WBC 9.0 10.8*  HGB 8.2* 8.4*  HCT 25.9* 26.1*  PLT 225 241   No results found for this basename: NA:2,K:2,CL:2,CO2:2,GLUCOSE:2,BUN:2,CREATININE:2,CALCIUM:2 in the last 72 hours CBG (last 3)   Basename 03/05/12 0730 03/04/12 2103 03/04/12 1639  GLUCAP 105* 87 99    Wt Readings from Last 3 Encounters:  03/05/12 105.1 kg (231 lb 11.3 oz)  02/29/12 103.465 kg (228 lb 1.6 oz)  02/29/12 103.465 kg (228 lb 1.6 oz)    Physical Exam:  HENT:  Head: Normocephalic.  Eyes:  Pupils round and reactive to light , oral mucosa pink and moist  Neck: Neck supple. No thyromegaly present.  Cardiovascular: Normal rate and regular rhythm. No murmurs  Pulmonary/Chest: Breath sounds normal. No respiratory distress. He has no wheezes.  Abdominal: Bowel sounds are normal. He exhibits no distension. There is no tenderness.  Musculoskeletal: He exhibits edema. Mild calf tenderness, low back tenderness ato L5-S1. Tenderness at right axilla.  Neurological: He is alert and oriented to person, place, and time. He has normal reflexes. CN normal Follows three-step commands. Thought processing much improved. . Mild weakness with biceps and wrist extension, 4/5. [name] Tingling on palmar 1st and 2nd fingers of right hand. Good sitting balance. General weakness in legs 3-4+ proximal weaker than distal. DTR's are grossly 1+  Skin:  Chest incision clean and dry,  right shoulder incision intact as well.  Psychiatric: He has a normal mood and affect    Assessment/Plan: 1. Functional deficits secondary to AAA repair, deconditioning, encephalopathy, mild brachial plexus injury which require 3+ hours per day of interdisciplinary therapy in a comprehensive inpatient rehab setting. Physiatrist is providing close team supervision and 24 hour management of active medical problems listed below. Physiatrist and rehab team continue to assess barriers to discharge/monitor patient progress toward functional and medical goals. FIM: FIM - Bathing Bathing Steps Patient Completed: Chest;Right Arm;Left Arm;Abdomen;Front perineal area;Buttocks;Right upper leg;Left upper leg;Right lower leg (including foot);Left lower leg (including foot) Bathing: 5: Supervision: Safety issues/verbal cues  FIM - Upper Body Dressing/Undressing Upper body dressing/undressing steps patient completed: Thread/unthread right sleeve of pullover shirt/dresss;Thread/unthread left sleeve of pullover shirt/dress;Put head through opening of pull over shirt/dress;Pull shirt over trunk Upper body dressing/undressing: 5: Supervision: Safety issues/verbal cues FIM - Lower Body Dressing/Undressing Lower body dressing/undressing steps patient completed: Thread/unthread right underwear leg;Thread/unthread left underwear leg;Pull underwear up/down;Thread/unthread right pants leg;Thread/unthread left pants leg;Pull pants up/down;Don/Doff right sock;Don/Doff left sock Lower body dressing/undressing: 5: Supervision: Safety issues/verbal cues        FIM - Bed/Chair Transfer Bed/Chair Transfer: 6: Supine > Sit: No assist;4: Bed > Chair or W/C: Min A (steadying Pt. > 75%);4: Chair or W/C > Bed: Min A (steadying Pt. > 75%)  FIM - Locomotion: Wheelchair Locomotion: Wheelchair: 1: Total Assistance/staff pushes wheelchair (Pt<25%) FIM - Locomotion: Ambulation Ambulation/Gait Assistance: 4: Min guard Locomotion:  Ambulation: 4: Travels 150 ft or more with minimal assistance (Pt.>75%)  Comprehension Comprehension Mode: Auditory  Comprehension: 5-Understands complex 90% of the time/Cues < 10% of the time  Expression Expression Mode: Verbal Expression: 5-Expresses complex 90% of the time/cues < 10% of the time  Social Interaction Social Interaction Mode: Asleep Social Interaction: 5-Interacts appropriately 90% of the time - Needs monitoring or encouragement for participation or interaction.  Problem Solving Problem Solving: 5-Solves complex 90% of the time/cues < 10% of the time  Memory Memory: 4-Recognizes or recalls 75 - 89% of the time/requires cueing 10 - 24% of the time Medical Problem List and Plan:  1. Thoracic aortic aneurysm status post emergent repair with postop encephalopathy 02/20/2012  2. DVT Prophylaxis/Anticoagulation: left posterior tibial vein DVT- iv heparin to coumadin. Check serial labs.INR sub therapeutic,   -activity as tolerated  3. Pain Management: Vicodin as needed. Monitor with increased mobility   -added lidoderm patches  -kpad  -has hx of low back pain PTA  -"chest pain" reported by RN was actually axillary, rib. rx as above, improved 4. Mood/anxiety. Patient on Xanax in the past. Will monitor closely and provide emotional support   -eager to leave hospital 5. Neuropsych: This patient is capable of making decisions on his/her own behalf.  6. Postoperative anemia. Latest hemoglobin 9.6. Followup labs next week 7. Hypertension. Lopressor 25 mg twice a day. Monitor with increased mobility  8. Elevated blood sugars. Hemoglobin A1c of 7.5. Continue Lantus insulin 24 units daily.Will add glyburide. Try to wean lantus.  9. Asthma/tobacco abuse. Continue NicoDerm patch. Singulair 10 mg daily, Flonase daily. Provide counseling in regards to tobacco abuse  10. Hyperlipidemia. Lipitor 11. Possible mild brachial plexopathy, sensory symptoms, may need outpt EMG if symptoms do  not resolve  LOS (Days) 5 A FACE TO FACE EVALUATION WAS PERFORMED  Donasia Wimes E 03/05/2012, 9:23 AM

## 2012-03-06 ENCOUNTER — Inpatient Hospital Stay (HOSPITAL_COMMUNITY): Payer: 59 | Admitting: Physical Therapy

## 2012-03-06 ENCOUNTER — Inpatient Hospital Stay (HOSPITAL_COMMUNITY): Payer: 59 | Admitting: Speech Pathology

## 2012-03-06 ENCOUNTER — Inpatient Hospital Stay (HOSPITAL_COMMUNITY): Payer: 59 | Admitting: Occupational Therapy

## 2012-03-06 DIAGNOSIS — I7101 Dissection of thoracic aorta: Secondary | ICD-10-CM

## 2012-03-06 DIAGNOSIS — G9341 Metabolic encephalopathy: Secondary | ICD-10-CM

## 2012-03-06 DIAGNOSIS — Z5189 Encounter for other specified aftercare: Secondary | ICD-10-CM

## 2012-03-06 LAB — CBC
HCT: 26.6 % — ABNORMAL LOW (ref 39.0–52.0)
Hemoglobin: 8.5 g/dL — ABNORMAL LOW (ref 13.0–17.0)
MCV: 94 fL (ref 78.0–100.0)
RBC: 2.83 MIL/uL — ABNORMAL LOW (ref 4.22–5.81)
RDW: 14.9 % (ref 11.5–15.5)
WBC: 8.6 10*3/uL (ref 4.0–10.5)

## 2012-03-06 LAB — GLUCOSE, CAPILLARY
Glucose-Capillary: 170 mg/dL — ABNORMAL HIGH (ref 70–99)
Glucose-Capillary: 62 mg/dL — ABNORMAL LOW (ref 70–99)
Glucose-Capillary: 81 mg/dL (ref 70–99)
Glucose-Capillary: 118 mg/dL — ABNORMAL HIGH (ref 70–99)

## 2012-03-06 LAB — PROTIME-INR: INR: 2.1 — ABNORMAL HIGH (ref 0.00–1.49)

## 2012-03-06 MED ORDER — FE FUMARATE-B12-VIT C-FA-IFC PO CAPS
1.0000 | ORAL_CAPSULE | Freq: Two times a day (BID) | ORAL | Status: DC
Start: 2012-03-06 — End: 2012-03-08
  Administered 2012-03-06 – 2012-03-08 (×5): 1 via ORAL
  Filled 2012-03-06 (×7): qty 1

## 2012-03-06 MED ORDER — INSULIN GLARGINE 100 UNIT/ML ~~LOC~~ SOLN
10.0000 [IU] | Freq: Every day | SUBCUTANEOUS | Status: DC
  Administered 2012-03-07: 10 [IU] via SUBCUTANEOUS

## 2012-03-06 MED ORDER — WARFARIN SODIUM 7.5 MG PO TABS
7.5000 mg | ORAL_TABLET | Freq: Once | ORAL | Status: AC
  Administered 2012-03-06: 7.5 mg via ORAL
  Filled 2012-03-06: qty 1

## 2012-03-06 NOTE — Progress Notes (Signed)
ANTICOAGULATION CONSULT NOTE - Follow Up Consult  Pharmacy Consult:  Coumadin Indication:  DVT  Allergies  Allergen Reactions  . Testosterone Shortness Of Breath    Medication:Androgel Pt states trouble breathing at night    Patient Measurements: Weight: 231 lb 7.7 oz (105 kg) Heparin Dosing Weight: 102 kg  Vital Signs: Temp: 98.7 F (37.1 C) (09/23 0506) BP: 107/68 mmHg (09/23 0506) Pulse Rate: 83  (09/23 0506)  Labs:  [name] 03/06/12 0626 03/05/12 0639 03/04/12 0720  HGB 8.5* 8.2* --  HCT 26.6* 25.9* 26.1*  PLT 202 225 241  APTT -- -- --  LABPROT 22.7* 21.3* 17.9*  INR 2.10* 1.93* 1.52*  HEPARINUNFRC 0.58 0.63 0.59  CREATININE -- -- --  CKTOTAL -- -- --  CKMB -- -- --  TROPONINI -- -- --    The CrCl is unknown because both a height and weight (above a minimum accepted value) are required for this calculation.       Assessment: 60 YOM continues on Coumadin for calf DVT.  Heparin gtt discontinued prior to therapeutic INR x 2 due to high bleeding risk and location of clot.  Patient did receive 5 days of overlap of IV heparin and Coumadin.  No bleeding reported.   Goal of Therapy:  INR 2-3 Monitor platelets by anticoagulation protocol: Yes    Plan:  - Coumadin 7.5mg  PO today to prevent drop in INR - Daily PT / INR - F/U CBGs and insulin adjustment     [name] D. Laney Potash, PharmD, BCPS Pager:  [phone] 03/06/2012, 9:31 AM

## 2012-03-06 NOTE — Progress Notes (Signed)
  Subjective  Emer repair Type I dissection Making progress @ rehab DVT LLE on coumadin INR goal 2.0 w/ recent aortic surgery- Echo shows small but enlarging effusion since anticoagulation started Numbness and weakness RUE improving  W/ PT Objective: Vital signs in last 24 hours: Temp:  [97.2 F (36.2 C)-98.7 F (37.1 C)] 98.7 F (37.1 C) (09/23 0506) Pulse Rate:  [69-84] 83  (09/23 0506) Cardiac Rhythm:  [-]  Resp:  [19] 19  (09/23 0506) BP: (105-122)/(60-73) 110/60 mmHg (09/23 1333) SpO2:  [98 %-99 %] 98 % (09/23 0506) Weight:  [231 lb 7.7 oz (105 kg)] 231 lb 7.7 oz (105 kg) (09/23 0506)  Hemodynamic parameters for last 24 hours:  NSR Incisions clean, healing  Intake/Output from previous day: 09/22 0701 - 09/23 0700 In: 1540 [P.O.:840; I.V.:460] Out: 2175 [Urine:2175] Intake/Output this shift: Total I/O In: 240 [P.O.:240] Out: -   No AI murmur Good pulses  Lab Results:  Basename 03/06/12 0626 03/05/12 0639  WBC 8.6 9.0  HGB 8.5* 8.2*  HCT 26.6* 25.9*  PLT 202 225   BMET: No results found for this basename: NA:2,K:2,CL:2,CO2:2,GLUCOSE:2,BUN:2,CREATININE:2,CALCIUM:2 in the last 72 hours  PT/INR:  Basename 03/06/12 0626  LABPROT 22.7*  INR 2.10*   ABG    Component Value Date/Time   PHART 7.442 02/22/2012 2035   HCO3 24.5* 02/22/2012 2035   TCO2 26 02/22/2012 2035   ACIDBASEDEF 1.0 02/21/2012 1351   O2SAT 86.0 02/22/2012 2035   CBG (last 3)   Basename 03/06/12 1210 03/06/12 1145 03/06/12 0736  GLUCAP 81 62* 170*    Assessment/Plan: S/P  repair Type I dissection Pt has 6cm infrarenal AAA recently dx'ed- to be followed by Dr Hart [facility] as outpt for later surgery Will check pericard effusion w/ Echo prior to DC later in week Patient at risk for tamponade if his INR is too elevated- rec coumadin- INR  f/u with Juleen Starr PharmD [facility] Charlsie Quest 03/06/2012

## 2012-03-06 NOTE — Progress Notes (Signed)
Speech Language Pathology Daily Session Note  Patient Details  Name: [name] MRN: [id number] Date of Birth: 1950/10/04  Today's Date: 03/06/2012 Time: 0930-1015 Time Calculation (min): 45 min  Short Term Goals: Week 1: SLP Short Term Goal 1 (Week 1): Pt will demonstrate complex problem solving with supervision verbal cues.  SLP Short Term Goal 1 - Progress (Week 1): Progressing toward goal SLP Short Term Goal 2 (Week 1): Pt will utilize external memory aids to increase recall/carryover of new information with Min verbal and visual cues.  SLP Short Term Goal 2 - Progress (Week 1): Progressing toward goal SLP Short Term Goal 3 (Week 1): Pt will express complex wants/needs with Mod I.  SLP Short Term Goal 3 - Progress (Week 1): Progressing toward goal  Skilled Therapeutic Interventions: Treatment focus on complex problem solving, organization and utilization of compensatory strategies to increase recall. Pt participated in new learning task and demonstrated Mod I for complex problem solving and organization but required extra time to complete task. Pt also demonstrated recall of rules of game independently, however, required Min question cues to utilize compensatory strategy of writing information down to increase recall of staff's names (unable to recall nursing staff's names for a card he would like to write).    FIM:  Comprehension Comprehension: 5-Understands complex 90% of the time/Cues < 10% of the time Expression Expression: 5-Expresses complex 90% of the time/cues < 10% of the time Social Interaction Social Interaction: 5-Interacts appropriately 90% of the time - Needs monitoring or encouragement for participation or interaction. Problem Solving Problem Solving: 5-Solves complex 90% of the time/cues < 10% of the time Memory Memory: 4-Recognizes or recalls 75 - 89% of the time/requires cueing 10 - 24% of the time  Pain Pain Assessment Pain Assessment: No/denies  pain  Therapy/Group: Individual Therapy  Noele Icenhour 03/06/2012, 11:30 AM

## 2012-03-06 NOTE — Progress Notes (Signed)
Social Work  Social Work Assessment and Plan  Patient Details  Name: [name] MRN: [id number] Date of Birth: [birth year]  Today's Date: 03/06/2012  Problem List:  Patient Active Problem List  Diagnosis  . UNSPECIFIED VITAMIN D DEFICIENCY  . HYPERLIPIDEMIA  . OBSTRUCTIVE SLEEP APNEA  . MIGRAINE HEADACHE  . ALLERGIC RHINITIS  . ASTHMA  . ERECTILE DYSFUNCTION, ORGANIC  . Hypogonadism male  . Acute encephalopathy   Past Medical History:  Past Medical History  Diagnosis Date  . OBSTRUCTIVE SLEEP APNEA 02/2008 sleep study  . MIGRAINE HEADACHE   . ERECTILE DYSFUNCTION, ORGANIC   . Unspecified vitamin D deficiency   . HYPERLIPIDEMIA   . ASTHMA   . ALLERGIC RHINITIS    Past Surgical History:  Past Surgical History  Procedure Date  . Nasal fx without repair   . Thoracic aortic aneurysm repair 02/20/2012    Procedure: THORACIC ASCENDING ANEURYSM REPAIR (AAA);  Surgeon: Kerin Perna, MD;  Location: [facility] OR;  Service: Open Heart Surgery;  Laterality: N/A;   Social History:  reports that he has been smoking.  He does not have any smokeless tobacco history on file. He reports that he drinks alcohol. His drug history not on file.  Family / Support Systems Marital Status: Divorced Patient Roles: Partner;Parent;Other (Comment) Spouse/Significant Other: girlfriend, Evlyn Clines @ ([place] [phone] Children: daughter, Keino Placencia @ [phone] and Achilles Dunk - both live locally Anticipated Caregiver: Daughter, Diannia Ruder Ability/Limitations of Caregiver: none Caregiver Availability: 24/7 Diannia Ruder to take [facility] & stay with pt. Pt's partner to help .) Family Dynamics: girlfriend and daughters with good relationship-  Social History Preferred language: English Religion: None Cultural Background: NA Education: college Read: Yes Write: Yes Employment Status: Employed Name of Employer: [employer] Return to Work Plans: once cleared medically Fish farm manager Issues:  none Guardian/Conservator: none   Abuse/Neglect Physical Abuse: Denies Verbal Abuse: Denies Sexual Abuse: Denies Exploitation of patient/patient's resources: Denies Self-Neglect: Denies  Emotional Status Pt's affect, behavior adn adjustment status: pleasant, oriented gentleman here after AAA repair - frustrated with lack of sleep and "constantly having somebody coming into the room".  Denies any s/s of depression/ anxiety, however will monitor and request neuropsych intervention if deemed necessary. Recent Psychosocial Issues: none Pyschiatric History: none Substance Abuse History: none  Patient / Family Perceptions, Expectations & Goals Pt/Family understanding of illness & functional limitations: pt and girlfriend/ family with good understanding of surgery performed and of current functional limitations. Premorbid pt/family roles/activities: completely independent and working full-time Anticipated changes in roles/activities/participation: girlfriend and daughters to provide some caregiver assistance - Pt/family expectations/goals: "just want to get home and get some sleep"  [employer]: None Premorbid Home Care/DME Agencies: None Transportation available at discharge: yes  Discharge Planning Living Arrangements: Alone Support Systems: Spouse/significant other;Children Type of Residence: Private residence Insurance Resources: Media planner (specify) Horticulturist, commercial) Financial Resources: Employment Surveyor, quantity Screen Referred: No Living Expenses: Database administrator Management: Patient Do you have any problems obtaining your medications?: No Home Management: pt Patient/Family Preliminary Plans: to his own home - girlfriend and daughters to provide any assistance needed Social Work Anticipated Follow Up Needs: HH/OP Expected length of stay: 2 weeks (2 weeks)  Clinical Impression Pleasant, oriented gentleman here after AAA repair with girlfriend at bedside.   Supportive daughters who are both local.  Anticipate short LOS  Shayden Gingrich 03/06/2012, 8:56 AM

## 2012-03-06 NOTE — Progress Notes (Signed)
Hypoglycemic Event  CBG:62 at 1145  Treatment: 15 GM carbohydrate snack  Symptoms: Nervous/irritable  Follow-up CBG: Time:1210 CBG Result:81  Possible Reasons for Event: Unknown  Comments/MD notified:D. Anguilli PA    [name]  Remember to initiate Hypoglycemia Order Set & complete

## 2012-03-06 NOTE — Progress Notes (Signed)
Occupational Therapy Session Note  Patient Details  Name: KAL CHAIT MRN: [id number] Date of Birth: 1950-06-26  Today's Date: 03/06/2012 Time: 1100-1200 Time Calculation (min): 60 min  Short Term Goals: Week 1:  OT Short Term Goal 1 (Week 1): STG=LTG  Skilled Therapeutic Interventions/Progress Updates:    self care retraining: session focused on adherence to sternal precautions during functional activities and safe functional mobility. Pt able to state sternal precautions when questioned at beginning of session. Pt performed bathing tasks at shower level with sit<>stand for bathing buttocks and ambulating to and from BR without AD with supervision; performed dressing tasks at EOB with supervision; pt able to adhere to precautions without cueing during session. Pt noted to have increased fatigues after simple self-care tasks.  Therapy Documentation Precautions:  Precautions Precautions: Fall;Sternal Precaution Comments: Able to state sternal precautions with min vc, c/o decreased sensation in feet Restrictions Weight Bearing Restrictions: No Pain:  no c/o pain this session See FIM for current functional status  Therapy/Group: Individual Therapy  Jackelyn Poling 03/06/2012, 3:35 PM

## 2012-03-06 NOTE — Progress Notes (Signed)
Late entry .  Heparin stopped this am at 0845  Per order. Continue with plan of care.           Cleotilde Neer

## 2012-03-06 NOTE — Progress Notes (Signed)
Inpatient Diabetes Program Recommendations  AACE/ADA: New Consensus Statement on Inpatient Glycemic Control (2013)  Target Ranges:  Prepandial:   less than 140 mg/dL      Peak postprandial:   less than 180 mg/dL (1-2 hours)      Critically ill patients:  140 - 180 mg/dL   Reason for Visit: Hypoglycemia  Inpatient Diabetes Program Recommendations Correction (SSI): Correction scale ordered is from the cardiac glycemic control order set.  Please change to the glycemic control order set for the med-surg pts and use the sensitive correction tidwc and the HS scale for HS. (The cardiac correction scale is used for tighter control in light of the cardiac setting.  This appears too high a correction scale for this setting).   Note: Thank you, Lenor Coffin, RN, CNS, Diabetes Coordinator [phone])

## 2012-03-06 NOTE — Progress Notes (Signed)
Physical Therapy Session Note  Patient Details  Name: [name] MRN: [id number] Date of Birth: Jun 15, 1950  Today's Date: 03/06/2012 Time: 4098-1191 Time Calculation (min): 54 min  Second Session:  15:02-15:34 Time:  32 min  Skilled Therapeutic Interventions/Progress Updates:    Pt 'groggy' from lack of sleep.  Pt reports that his balance is off.  Pt required multiple rest breaks due to being short of breath and fatigue. Second Session:  Woke pt from a sound sleep.  Therapy Documentation Precautions:  Precautions Precautions: Fall;Sternal Precaution Comments: Able to state sternal precautions with min vc, c/o decreased sensation in feet Restrictions Weight Bearing Restrictions: No Pain: Pain Assessment Pain Assessment: No/denies pain Mobility:  Supine to sit with supervision, sit to stand with min@, both sessions Locomotion :   Gait on the unit x 80' with min@ x2. Second Session:  Gait on unit 180' with min@.  Pt noticeable tired afterwards. Balance:  Training on biodex with limits of stability and random control, pt able to perform both with supervision for balance. Second Session:  Standing on foam balance board performing reaching activities with min@, dynamic standing activity stepping in various directions with min@.  See FIM for current functional status  Therapy/Group: Individual Therapy  Georges Mouse 03/06/2012, 12:16 PM

## 2012-03-06 NOTE — Progress Notes (Signed)
Patient ID: [name], male   DOB: Jan 28, 1951, 61 y.o.   MRN: [id number] Subjective/Complaints: Right axillary/chest wall pain.Back pain intermittent--more problematic at night. Axillary pain is aching, not  Burning or tingling. Not associated with any of hand/wrist numbness.  No sob, palpitations R hand tingling Lower abd discomfort, no nausea or vomiting A 12 point review of systems has been performed and if not noted above is otherwise negative.   Objective: Vital Signs: Blood pressure 107/68, pulse 83, temperature 98.7 F (37.1 C), temperature source Oral, resp. rate 19, weight 105 kg (231 lb 7.7 oz), SpO2 98.00%. No results found.  Basename 03/06/12 0626 03/05/12 0639  WBC 8.6 9.0  HGB 8.5* 8.2*  HCT 26.6* 25.9*  PLT 202 225   No results found for this basename: NA:2,K:2,CL:2,CO2:2,GLUCOSE:2,BUN:2,CREATININE:2,CALCIUM:2 in the last 72 hours CBG (last 3)   Basename 03/05/12 2126 03/05/12 1833 03/05/12 1745  GLUCAP 126* 151* 65*    Wt Readings from Last 3 Encounters:  03/06/12 105 kg (231 lb 7.7 oz)  02/29/12 103.465 kg (228 lb 1.6 oz)  02/29/12 103.465 kg (228 lb 1.6 oz)    Physical Exam:  HENT:  Head: Normocephalic.  Eyes:  Pupils round and reactive to light , oral mucosa pink and moist  Neck: Neck supple. No thyromegaly present.  Cardiovascular: Normal rate and regular rhythm. No murmurs  Pulmonary/Chest: Breath sounds normal. No respiratory distress. He has no wheezes.  Abdominal: Bowel sounds are normal. He exhibits no distension. There is no tenderness.  Musculoskeletal: He exhibits edema. Mild calf tenderness, low back tenderness ato L5-S1. Tenderness at right axilla.  Neurological: He is alert and oriented to person, place, and time. He has normal reflexes. CN normal Follows three-step commands. Thought processing much improved. . Mild weakness with biceps and wrist extension, 4/5. [name] Tingling on palmar 1st and 2nd fingers of right hand. Good sitting  balance. General weakness in legs 3-4+ proximal weaker than distal. DTR's are grossly 1+  Skin:  Chest incision clean and dry, right shoulder incision intact as well.  Psychiatric: He has a normal mood and affect    Assessment/Plan: 1. Functional deficits secondary to AAA repair, deconditioning, encephalopathy, mild brachial plexus injury which require 3+ hours per day of interdisciplinary therapy in a comprehensive inpatient rehab setting. Physiatrist is providing close team supervision and 24 hour management of active medical problems listed below. Physiatrist and rehab team continue to assess barriers to discharge/monitor patient progress toward functional and medical goals. FIM: FIM - Bathing Bathing Steps Patient Completed: Chest;Right Arm;Left Arm;Abdomen;Front perineal area;Buttocks;Right upper leg;Left upper leg;Right lower leg (including foot);Left lower leg (including foot) Bathing: 5: Supervision: Safety issues/verbal cues  FIM - Upper Body Dressing/Undressing Upper body dressing/undressing steps patient completed: Thread/unthread right sleeve of pullover shirt/dresss;Thread/unthread left sleeve of pullover shirt/dress;Put head through opening of pull over shirt/dress;Pull shirt over trunk Upper body dressing/undressing: 5: Supervision: Safety issues/verbal cues FIM - Lower Body Dressing/Undressing Lower body dressing/undressing steps patient completed: Thread/unthread right underwear leg;Thread/unthread left underwear leg;Pull underwear up/down;Thread/unthread right pants leg;Thread/unthread left pants leg;Pull pants up/down;Don/Doff right sock;Don/Doff left sock Lower body dressing/undressing: 5: Supervision: Safety issues/verbal cues        FIM - Bed/Chair Transfer Bed/Chair Transfer: 6: Supine > Sit: No assist;4: Bed > Chair or W/C: Min A (steadying Pt. > 75%);4: Chair or W/C > Bed: Min A (steadying Pt. > 75%)  FIM - Locomotion: Wheelchair Locomotion: Wheelchair: 1: Total  Assistance/staff pushes wheelchair (Pt<25%) FIM - Locomotion: Ambulation Ambulation/Gait Assistance: 4:  Min guard Locomotion: Ambulation: 4: Travels 150 ft or more with minimal assistance (Pt.>75%)  Comprehension Comprehension Mode: Auditory Comprehension: 5-Understands complex 90% of the time/Cues < 10% of the time  Expression Expression Mode: Verbal Expression: 5-Expresses complex 90% of the time/cues < 10% of the time  Social Interaction Social Interaction Mode: Asleep Social Interaction: 5-Interacts appropriately 90% of the time - Needs monitoring or encouragement for participation or interaction.  Problem Solving Problem Solving: 5-Solves complex 90% of the time/cues < 10% of the time  Memory Memory: 4-Recognizes or recalls 75 - 89% of the time/requires cueing 10 - 24% of the time Medical Problem List and Plan:  1. Thoracic aortic aneurysm status post emergent repair with postop encephalopathy 02/20/2012  2. DVT Prophylaxis/Anticoagulation: left posterior tibial vein DVT- coumadin therapeutic--dc heparin  -activity as tolerated  3. Pain Management: Vicodin as needed. Monitor with increased mobility   -added lidoderm patches  -kpad, muscle rub  -has hx of low back pain PTA  -axillary pain-- Don't think this pain is nerve related. May be mild muscle/tendon strain. 4. Mood/anxiety. Patient on Xanax in the past. Will monitor closely and provide emotional support   -eager to leave hospital 5. Neuropsych: This patient is capable of making decisions on his/her own behalf.  6. Postoperative anemia. hgb 8.5 today.  -fe supplement, change to oral form 7. Hypertension. Lopressor 25 mg twice a day. Monitor with increased mobility  8. Elevated blood sugars. Hemoglobin A1c of 7.5. Continue Lantus insulin 24 units daily.Will add glyburide. Try to wean lantus.  9. Asthma/tobacco abuse. Continue NicoDerm patch. Singulair 10 mg daily, Flonase daily. Provide counseling in regards to tobacco  abuse  10. Hyperlipidemia. Lipitor   LOS (Days) 6 A FACE TO FACE EVALUATION WAS PERFORMED  SWARTZ,ZACHARY T 03/06/2012, 7:30 AM

## 2012-03-06 NOTE — Progress Notes (Signed)
This note has been reviewed and this clinician agrees with information provided.  

## 2012-03-07 ENCOUNTER — Inpatient Hospital Stay (HOSPITAL_COMMUNITY): Payer: 59 | Admitting: Physical Therapy

## 2012-03-07 ENCOUNTER — Inpatient Hospital Stay (HOSPITAL_COMMUNITY): Payer: 59

## 2012-03-07 ENCOUNTER — Inpatient Hospital Stay (HOSPITAL_COMMUNITY): Payer: 59 | Admitting: Occupational Therapy

## 2012-03-07 LAB — CBC
MCH: 30.4 pg (ref 26.0–34.0)
MCHC: 32.3 g/dL (ref 30.0–36.0)
Platelets: 219 10*3/uL (ref 150–400)
RDW: 15 % (ref 11.5–15.5)

## 2012-03-07 LAB — GLUCOSE, CAPILLARY: Glucose-Capillary: 80 mg/dL (ref 70–99)

## 2012-03-07 LAB — PROTIME-INR: Prothrombin Time: 22.7 seconds — ABNORMAL HIGH (ref 11.6–15.2)

## 2012-03-07 MED ORDER — WARFARIN SODIUM 7.5 MG PO TABS
7.5000 mg | ORAL_TABLET | Freq: Every day | ORAL | Status: DC
  Filled 2012-03-07: qty 1

## 2012-03-07 MED ORDER — WARFARIN SODIUM 6 MG PO TABS
6.0000 mg | ORAL_TABLET | Freq: Once | ORAL | Status: AC
  Administered 2012-03-07: 6 mg via ORAL
  Filled 2012-03-07: qty 1

## 2012-03-07 MED ORDER — FUROSEMIDE 40 MG PO TABS
40.0000 mg | ORAL_TABLET | Freq: Every day | ORAL | Status: DC
  Administered 2012-03-07 – 2012-03-08 (×2): 40 mg via ORAL
  Filled 2012-03-07 (×3): qty 1

## 2012-03-07 MED ORDER — GLYBURIDE 5 MG PO TABS
10.0000 mg | ORAL_TABLET | Freq: Every day | ORAL | Status: DC
  Administered 2012-03-08: 10 mg via ORAL
  Filled 2012-03-07 (×2): qty 2

## 2012-03-07 NOTE — Discharge Summary (Signed)
[name], [name] NO.:  [id number]  MEDICAL RECORD NO.:  [id number]  LOCATION:  4038                         FACILITY:  [facility]  PHYSICIAN:  Mariam Dollar, P.A.  DATE OF BIRTH:  [birth year]  DATE OF ADMISSION:  02/29/2012 DATE OF DISCHARGE:                              DISCHARGE SUMMARY   DISCHARGE DIAGNOSES: 1. Thoracic aortic aneurysm with emergent repair with postoperative     encephalopathy. 2. Left posterior tibial vein deep venous thrombosis with Coumadin     therapy. 3. Pain management. 4. Mood with anxiety. 5. Postoperative anemia. 6. Hypertension. 7. Elevated blood sugars. 8. Asthma. 9. Hyperlipidemia.  This is a 61 year old right-handed male with obstructive sleep apnea, tobacco abuse.  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  Admitted on  February 20, 2012 with transient symptoms of headache, difficulty with speech and leg weakness. Cranial CT scan performed that was negative.  He was having chest pain and a CT scan was performed to rule out pulmonary embolism.  A second CTA of the thoracic and abdominal aorta demonstrated a type A dissection with the origin and the asymmetry aorta above the aortic valve continuing down just above the aortic bifurcation.  Underwent emergent repair of thoracic aortic dissection on February 20, 2012 per Dr. Donata Clay.  Noted bouts of hypoxia, the patient refusing oxygen at times. Followup speech therapy, questionable dysphagia, currently maintained on a clear liquid diet and advanced to a regular consistency.  The patient with some elevated blood sugar readings, hemoglobin A1c of 7.5.  Lantus insulin was initiated it was also noted he had been on low-dose prednisone therapy, which could be driving up his blood sugars. Postoperative anemia 7.5 to 8.9 and monitored.  A mediastinal chest tube had been in place and vancomycin initiated prophylactically.  His  chest tube had since been removed, as well as all antibiotics.  Noted bouts of confusion as well as recall of hospital events, suspect encephalopathy. The patient was admitted for comprehensive rehab program.  PAST MEDICAL HISTORY:  See discharge diagnoses.  SOCIAL HISTORY:  Lives alone.  He works at [employer].  Functional history prior to admission was independent. Functional status upon admission to rehab services was min assist, ambulate 150 feet with a rolling walker.  Total assist for stand pivot transfers.  PHYSICAL EXAMINATION:  VITAL SIGNS:  Blood pressure 110/60, pulse 79, respirations 19, temperature 98.3. GENERAL:  This was an alert male, followed 3 step commands, oriented to person, place, and time.  He did have some difficulty recalling full hospital events. LUNGS:  Clear to auscultation.  Midline chest incision is well healed. CARDIAC:  Regular rate and rhythm. ABDOMEN:  Soft, nontender.  Good bowel sounds.  REHABILITATION HOSPITAL COURSE:  The patient was admitted to inpatient rehab services with therapies initiated on a 3-hour daily basis consisting of physical therapy, occupational therapy, speech therapy, and rehabilitation nursing.  The following issues were addressed during the patient's rehabilitation stay.  Pertaining to Mr. Wesling thoracic aortic aneurysm repair followed by Dr. Donata Clay of Cardiothoracic Surgery.  Chest line incision is healing nicely.  Noted postoperative encephalopathy.  His cognition continued to greatly improved.  It was advised the family the need for supervision for safety awareness.  Noted during his rehabilitation hospital course, venous Doppler studies lower extremities for some subtle swelling on March 01, 2012, showed a left posterior tibial vein DVT.  Coumadin therapy was initiated after follow up with Dr. Donata Clay, no bleeding episodes noted.  He would continue to be followed on the outpatient  basis.  His blood pressures were well controlled, monitored with no orthostatic changes.  He did have some elevated blood sugars.  Hemoglobin A1c of 7.5, it was noted he was on prednisone therapy chronically for history of asthma and allergies.  His insulin therapy was slowly decreased with attempt at hopes to begin oral agents if at all possible.  The patient did have a history of tobacco abuse.  It was discussed at length, the need for cessation of smoking, it was questionable if he will be compliant with these requests.  He was continued Nicoderm patch.  However, he was refusing this at times.  The patient received weekly collaborative interdisciplinary team conferences to discuss estimated length of stay, family teaching, and any barriers to his discharge.  He was ambulating extended distances with handheld assistance.  Independent supervision for activities of daily living.  It was discussed no driving.  He was able to interact with staff 90% appropriate, need some monitoring for encouragement for participation in interaction.  He was to be discharged to home with family.  DISCHARGE MEDICATIONS:  At the time of dictation included; 1. Aspirin 81 mg daily. 2. Lipitor 20 mg daily. 3. Folic acid twice daily. 4. Flonase 2 sprays daily. 5. Glyburide 5 mg daily. 6. Norco 1 tablet every 4 hours as needed pain. 7. Lantus insulin 10 units with attempts to wean if possible. 8. Lidoderm patch change every 12 hours. 9. Lopressor 25 mg twice daily. 10.Singulair 10 mg daily. 11.NicoDerm patch, taper as directed. 12.Protonix 40 mg daily. 13.Potassium chloride 20 mEq daily. 14.Prednisone 5 mg daily. 15.Crestor 10 mg daily. 16.Senokot 1 tablet twice daily. 17.Coumadin 7.5 mg daily adjusted accordingly for an INR of 2.0 to     2.5  DIET:  Heart healthy diet.  SPECIAL INSTRUCTIONS:  The patient will follow up with Dr. Ranelle Oyster at the outpatient rehab service office as advised. Dr.  Kathlee Nations Trigt 2 weeks call for appointment.  Dr. Rene Paci for medical management. Followup with Dr. Mancel [facility]ale [facility] for prothrombin time on Monday, 03/13/2012     Mariam Dollar, P.A.     DA/MEDQ  D:  03/07/2012  T:  03/07/2012  Job:  [id number]  cc:   Ranelle Oyster, M.D. Valerie A. Felicity Coyer, MD Kerin Perna, M.D.

## 2012-03-07 NOTE — Discharge Summary (Signed)
  Discharge summary job [id number]

## 2012-03-07 NOTE — Progress Notes (Signed)
Speech Language Pathology Session Note & Discharge Summary  Patient Details  Name: [name] MRN: [id number] Date of Birth: April 02, 1951  Today's Date: 03/07/2012 Time: 4098-1191 Time Calculation (min): 25 min  Skilled Therapeutic Intervention: Treatment focus on pt/family education with the pt and his daughter in regards to complex problem solving, working memory, Teacher, early years/pre and divided attention and anticipatory awareness and strategies to utilize at home to increase overall cognitive function and independence. Pt and his daughter were provided handouts and verbalized and demonstrated understanding. Pt will discharge home with 24 hour supervision and recommend f/u outpatient SLP services.   Patient has met 3 of 3 long term goals.  Patient to discharge at overall Modified Independent;Supervision level.   Reasons goals not met: N/A   Clinical Impression/Discharge Summary: Pt has made functional gains and has met 3 out of 3 LTG's this admission. Currently, pt is overall supervision-Mod I for complex problem solving, higher-level organization, emergent awareness, alternating attention and working memory with utilization of compensatory strategies. Pt/family education complete. Pt would benefit from f/u outpatient skilled SLP services to maximize higher-level cognitive functioning and overall independence.    Care Partner:     Type of Caregiver Assistance: Physical;Cognitive  Recommendation:  Outpatient SLP  Rationale for SLP Follow Up: Maximize cognitive function and independence   Equipment: N/A   Reasons for discharge: Treatment goals met;Discharged from hospital   Patient/Family Agrees with Progress Made and Goals Achieved: Yes   See FIM for current functional status  Clearence Vitug 03/07/2012, 4:04 PM

## 2012-03-07 NOTE — Progress Notes (Signed)
Physical Therapy Session Note  Patient Details  Name: [name] MRN: [id number] Date of Birth: 09-27-1950  Today's Date: 03/07/2012 Time: 4098-1191 Time Calculation (min): 26 min  Short Term Goals: Week 1:   STGs=LTGs  Skilled Therapeutic Interventions/Progress Updates:    This session focused on balance both static and dynamic and endurance training.  See details below.    Therapy Documentation Precautions:  Precautions Precautions: Sternal Precaution Comments: Able to state sternal precautions with min vc, c/o decreased sensation in feet Restrictions Weight Bearing Restrictions: No General: Amount of Missed PT Time (min): 19 Minutes Missed Time Reason: Other (comment) (eating-lunch just arrived) Vital Signs:   Pain: Pain Assessment Pain Assessment: 0-10 Pain Score:   5 (while working with PT) Pain Type: Acute pain Pain Location: Shoulder Pain Orientation: Right Pain Intervention(s): Ambulation/increased activity;Repositioned Mobility: Bed Mobility Left Sidelying to Sit: 6: Modified independent (Device/Increase time) Supine to Sit: 6: Modified independent (Device/Increase time) Sitting - Scoot to Edge of Bed: 6: Modified independent (Device/Increase time) Sit to Supine: 6: Modified independent (Device/Increase time) Transfers Sit to Stand: 6: Modified independent (Device/Increase time);With upper extremity assist;From bed Locomotion : Ambulation Ambulation/Gait Assistance: 5: Supervision Ambulation Distance (Feet): 300 Feet Assistive device: None Ambulation/Gait Assistance Details: supervision needed secondary to pt walking and tossing ball as multi tasking during gait.    Trunk/Postural Assessment : Cervical Assessment Cervical Assessment: Within Functional Limits Thoracic Assessment Thoracic Assessment: Within Functional Limits Lumbar Assessment Lumbar Assessment: Within Functional Limits Postural Control Postural Control: Within Functional Limits    Balance: Static Standing Balance Static Standing - Balance Support: During functional activity;No upper extremity supported Static Standing - Level of Assistance: 6: Modified independent (Device/Increase time) Exercises:   Other Treatments: Treatments Therapeutic Activity: standing balance ball tosss standing on blue foam square with feet apart and feet together, tandem stand bil multiple trials, single leg stand bil multiple trials.  Nustep level 5 x 8 mins legs only for endurance training (pt fatigued during morning session).    See FIM for current functional status  Therapy/Group: Individual Therapy  Shandon Matson, Claretta Fraise 03/07/2012, 4:40 PM

## 2012-03-07 NOTE — Progress Notes (Signed)
CT Surg     --      CXR shows inc L pleural effusion  Will give 5 day course lasix, KCL 2D echo in am to follow enlarging pericardial effusion INR 2.0 is goal. 2.5 is too high

## 2012-03-07 NOTE — Progress Notes (Addendum)
ANTICOAGULATION CONSULT NOTE - Follow Up Consult  Pharmacy Consult:  Coumadin Indication:  DVT  Allergies  Allergen Reactions  . Testosterone Shortness Of Breath    Medication:Androgel Pt states trouble breathing at night    Patient Measurements: Height: 6' 3.98" (193 cm) Weight: 231 lb 7.7 oz (105 kg) IBW/kg (Calculated) : 86.76  Heparin Dosing Weight: 102 kg  Vital Signs: Temp: 98.1 F (36.7 C) (09/24 0642) Temp src: Oral (09/24 0642) BP: 130/91 mmHg (09/24 0642) Pulse Rate: 82  (09/24 0642)  Labs:  Basename 03/07/12 0500 03/06/12 0626 03/05/12 0639  HGB 8.2* 8.5* --  HCT 25.4* 26.6* 25.9*  PLT 219 202 225  APTT -- -- --  LABPROT 22.7* 22.7* 21.3*  INR 2.10* 2.10* 1.93*  HEPARINUNFRC -- 0.58 0.63  CREATININE -- -- --  CKTOTAL -- -- --  CKMB -- -- --  TROPONINI -- -- --    Estimated Creatinine Clearance: 126.1 ml/min (by C-G formula based on Cr of 0.85).       Assessment: 60 YOM continues on Coumadin for calf DVT.  INR remains toward the low end of therapeutic range due to risk of tamponade.  No bleeding reported.  Noted discharge planning.   Goal of Therapy:  INR 2-3 Monitor platelets by anticoagulation protocol: Yes    Plan:  - Coumadin 7.5mg  PO daily - Daily PT / INR - F/U CBGs and insulin adjustment - Change daily CBC to weekly - Recommend discharging patient on Coumadin 5mg  tablet, take 1.5 tablet (=7.5mg ) PO daily.  INR within 1-2 days.     Day Deery D. Laney Potash, PharmD, BCPS Pager:  [phone] 03/07/2012, 8:05 AM   ======================================  Addendum: Noted MD stressed the INR goal to be at 2.  It will be difficult to attain such goal, but will try.  Will decrease Coumadin to 6mg  PO today.  F/U AM INR.   Samon Dishner D. Laney Potash, PharmD, BCPS Pager:  [phone] 03/07/2012, 12:49 PM

## 2012-03-07 NOTE — Progress Notes (Addendum)
Patient ID: [name], male   DOB: 1951/03/12, 61 y.o.   MRN: [id number] Subjective/Complaints: Mild right arm swelling. Some tingling over arm. Concerned about low cbg yesterday  A 12 point review of systems has been performed and if not noted above is otherwise negative.   Objective: Vital Signs: Blood pressure 130/78, pulse 82, temperature 98.1 F (36.7 C), temperature source Oral, resp. rate 19, height 6' 3.98" (1.93 m), weight 105 kg (231 lb 7.7 oz), SpO2 98.00%. Dg Chest 2 View  03/07/2012  *RADIOLOGY REPORT*  Clinical Data: Shortness of breath, chest pain.  CHEST - 2 VIEW  Comparison: 02/29/2012  Findings: Cardiomegaly and mediastinal prominence is similar to prior.  Status post mediasternotomy.  Surgical clips project over the right axilla.  There is a small left pleural effusion, increased in size in the interval.  Associate consolidations; atelectasis versus infiltrate.  No pneumothorax identified.  Left PICC with catheter tip projecting over the proximal SVC.  IMPRESSION: Prominent cardiomediastinal contours are similar to prior.  Increased left pleural effusion with associated consolidation; atelectasis versus infiltrate.   Original Report Authenticated By: Waneta Martins, M.D.     Basename 03/07/12 0500 03/06/12 0626  WBC 7.5 8.6  HGB 8.2* 8.5*  HCT 25.4* 26.6*  PLT 219 202   No results found for this basename: NA:2,K:2,CL:2,CO2:2,GLUCOSE:2,BUN:2,CREATININE:2,CALCIUM:2 in the last 72 hours CBG (last 3)   Basename 03/07/12 0725 03/06/12 2104 03/06/12 1805  GLUCAP 112* 134* 118*    Wt Readings from Last 3 Encounters:  03/06/12 105 kg (231 lb 7.7 oz)  02/29/12 103.465 kg (228 lb 1.6 oz)  02/29/12 103.465 kg (228 lb 1.6 oz)    Physical Exam:  HENT:  Head: Normocephalic.  Eyes:  Pupils round and reactive to light , oral mucosa pink and moist  Neck: Neck supple. No thyromegaly present.  Cardiovascular: Normal rate and regular rhythm. No murmurs    Pulmonary/Chest: Breath sounds normal. No respiratory distress. He has no wheezes.  Abdominal: Bowel sounds are normal. He exhibits no distension. There is no tenderness.  Musculoskeletal: He exhibits edema. Mild calf tenderness, low back tenderness ato L5-S1. Tenderness at right axilla.  Neurological: He is alert and oriented to person, place, and time. He has normal reflexes. CN normal Follows three-step commands. Thought processing much improved. . Mild weakness with biceps and wrist extension, 4/5. [name] Tingling on palmar 1st and 2nd fingers of right hand. Good sitting balance. General weakness in legs 3-4+ proximal weaker than distal. DTR's are grossly 1+  Skin:  Chest incision clean and dry, right shoulder incision intact as well.  Psychiatric: He has a normal mood and affect    Assessment/Plan: 1. Functional deficits secondary to AAA repair, deconditioning, encephalopathy, mild brachial plexus injury which require 3+ hours per day of interdisciplinary therapy in a comprehensive inpatient rehab setting. Physiatrist is providing close team supervision and 24 hour management of active medical problems listed below. Physiatrist and rehab team continue to assess barriers to discharge/monitor patient progress toward functional and medical goals. FIM: FIM - Bathing Bathing Steps Patient Completed: Chest;Right Arm;Left Arm;Abdomen;Front perineal area;Buttocks;Right upper leg;Left upper leg;Right lower leg (including foot);Left lower leg (including foot) Bathing: 6: More than reasonable amount of time  FIM - Upper Body Dressing/Undressing Upper body dressing/undressing steps patient completed: Thread/unthread right sleeve of pullover shirt/dresss;Thread/unthread left sleeve of pullover shirt/dress;Put head through opening of pull over shirt/dress;Pull shirt over trunk Upper body dressing/undressing: 6: More than reasonable amount of time FIM - Lower Body Dressing/Undressing  Lower body  dressing/undressing steps patient completed: Thread/unthread right underwear leg;Thread/unthread left underwear leg;Pull underwear up/down;Thread/unthread right pants leg;Thread/unthread left pants leg;Pull pants up/down;Fasten/unfasten pants;Don/Doff right shoe;Don/Doff left shoe;Fasten/unfasten right shoe;Fasten/unfasten left shoe Lower body dressing/undressing: 6: More than reasonable amount of time  FIM - Toileting Toileting steps completed by patient: Adjust clothing prior to toileting;Performs perineal hygiene;Adjust clothing after toileting Toileting: 6: Assistive device: No helper  FIM - Archivist Transfers: 5-To toilet/BSC: Supervision (verbal cues/safety issues)  FIM - Games developer Transfer: 6: Bed > Chair or W/C: No assist;6: Chair or W/C > Bed: No assist  FIM - Locomotion: Wheelchair Locomotion: Wheelchair: 0: Activity did not occur FIM - Locomotion: Ambulation Ambulation/Gait Assistance: 4: Min guard Locomotion: Ambulation: 6: Travels 150 ft or more independently/takes more than reasonable amount of time  Comprehension Comprehension Mode: Auditory Comprehension: 6-Follows complex conversation/direction: With extra time/assistive device  Expression Expression Mode: Verbal Expression: 6-Expresses complex ideas: With extra time/assistive device  Social Interaction Social Interaction Mode: Asleep Social Interaction: 5-Interacts appropriately 90% of the time - Needs monitoring or encouragement for participation or interaction.  Problem Solving Problem Solving: 5-Solves complex 90% of the time/cues < 10% of the time  Memory Memory: 6-More than reasonable amt of time Medical Problem List and Plan:  1. Thoracic aortic aneurysm status post emergent repair with postop encephalopathy 02/20/2012  2. DVT Prophylaxis/Anticoagulation: left posterior tibial vein DVT- coumadin therapeutic--dc heparin  -activity as tolerated  3. Pain Management:  Vicodin as needed. Monitor with increased mobility   -added lidoderm patches  -kpad, muscle rub  -has hx of low back pain PTA  -axillary pain-- Don't think this pain is nerve related. May be mild muscle/tendon strain. 4. Mood/anxiety. Patient on Xanax in the past. Will monitor closely and provide emotional support   -eager to leave hospital 5. Neuropsych: This patient is capable of making decisions on his/her own behalf.  6. Postoperative anemia. hgb 8.2 today.  -follow serially  -modest INR goal for coumadin to avoid tamponade 7. Hypertension. Lopressor 25 mg twice a day. Monitor with increased mobility  8. Elevated blood sugars. Hemoglobin A1c of 7.5. Continue Lantus insulin- dc today.increase glyburide to 10mg .   -reviewed glycemic control with patient  9. Asthma/tobacco abuse. Continue NicoDerm patch. Singulair 10 mg daily, Flonase daily. Provide counseling in regards to tobacco abuse  10. Hyperlipidemia. Lipitor   LOS (Days) 7 A FACE TO FACE EVALUATION WAS PERFORMED  SWARTZ,ZACHARY T 03/07/2012, 9:05 AM

## 2012-03-07 NOTE — Patient Care Conference (Signed)
Inpatient RehabilitationTeam Conference Note Date: 03/07/2012   Time: 2:55 PM    Patient Name: [name]      Medical Record Number: [id number]  Date of Birth: 11-25-1950 Sex: Male         Room/Bed: 4038/4038-01 Payor Info: Payor: [employer]  Plan: [place] [employer]  Product Type: *No Product type*     Admitting Diagnosis: aortic dissecton,encephalopathy  Admit Date/Time:  02/29/2012  4:40 PM Admission Comments: No comment available   Primary Diagnosis:  Acute encephalopathy Principal Problem: Acute encephalopathy  Patient Active Problem List   Diagnosis Date Noted  . Acute encephalopathy 03/01/2012  . Hypogonadism male 02/11/2011  . UNSPECIFIED VITAMIN D DEFICIENCY 12/17/2008  . HYPERLIPIDEMIA 12/17/2008  . OBSTRUCTIVE SLEEP APNEA 12/17/2008  . MIGRAINE HEADACHE 12/17/2008  . ALLERGIC RHINITIS 12/17/2008  . ASTHMA 12/17/2008  . ERECTILE DYSFUNCTION, ORGANIC 12/17/2008    Expected Discharge Date: Expected Discharge Date: 03/08/12  Team Members Present: Physician: Dr. Faith Rogue Social Worker Present: Amada Jupiter, LCSW Nurse Present: Carmie End, RN PT Present: Reggy Eye, PT OT Present: Mackie Pai, Marye Round, OT SLP Present: Feliberto Gottron, SLP Other (Discipline and Name): Tora Duck, PPS Coordinator     Current Status/Progress Goal Weekly Team Focus  Medical   AAA repari, post op encephalopathy, mild right brachial plexus injury, calf dvt  pain mgt, ego support, safety,   anticoagulation, balancing that with anemia   Bowel/Bladder   continent bowel and bladder lbm 9-22 stool softners required  mod independent   educate on constipation and meds   Swallow/Nutrition/ Hydration             ADL's   Supervision/cues for ADLs, steady A functional mobility  Mod I overall  adherence to sternal precaution without cues, functional mobility   Mobility   close supervision  mod I  balance, activity tolerance   Communication   Mod I  Mod I   Family Education    Tax adviser Observations  Supervision  Supervision  Family Education   Pain             Skin   vicodin 1 po prn minimal use for right side pain and sternal pain   less than 2   montior effectiveness of vicodin     Rehab Goals Patient on target to meet rehab goals: Yes *See Interdisciplinary Assessment and Plan and progress notes for long and short-term goals  Barriers to Discharge: acceptance of deficits, compliance with home plan    Possible Resolutions to Barriers:  pt education, family ed    Discharge Planning/Teaching Needs:  home with daughter and girlfriend to provide 24/7 supervision for initial 1-2 weeks home      Team Discussion:  Has made good progress and met therapy goals.  Medical issues remain and in discussion with transfer back to acute if needed as therapist have d/c'd patient  Revisions to Treatment Plan:  None   Continued Need for Acute Rehabilitation Level of Care: The patient requires daily medical management by a physician with specialized training in physical medicine and rehabilitation for the following conditions: Daily direction of a multidisciplinary physical rehabilitation program to ensure safe treatment while eliciting the highest outcome that is of practical value to the patient.: Yes Daily medical management of patient stability for increased activity during participation in an intensive rehabilitation regime.: Yes Daily analysis of laboratory values and/or radiology reports with any subsequent need for medication adjustment of medical intervention for : Neurological problems;Post surgical  problems;Cardiac problems;Other  [name] 03/07/2012, 3:37 PM

## 2012-03-07 NOTE — Progress Notes (Signed)
Occupational Therapy Treatment session and Discharge Summary  Patient Details  Name: [name] MRN: [id number] Date of Birth: 1951-01-13  Today's Date: 03/07/2012 Time:  8657-8469  Session focused on balance during functional ambulation and activity tolerance in preparation for d/c to home and return to community.  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 Pt took 3 min rest break before climbing one flight of stairs and took another rest break before returning to room. Discussed d/c plans with pt and daughter.  Discharge: Patient has met 11 of 11 long term goals due to improved activity tolerance, improved balance, improved attention and improved awareness.  Patient to discharge at overall Modified Independent level.  Patient's will have family and significant other available (daughters and partner) for assistance prn.  Anticipate pt only requiring assistance for advanced IADLs (home management) due to sternal precautions and decreased endurance.Pt still with numbness in right hand and UE - more numbness with dependent positions.     Reasons goals not met: NA  Recommendation:  No skilled OT recommended at this time  Equipment: No equipment provided  Reasons for discharge: treatment goals met  Patient/family agrees with progress made and goals achieved: Yes  OT Discharge Precautions/Restrictions  Precautions Precautions: Sternal Restrictions Weight Bearing Restrictions: No Pain  no c/o pain this session ADL  See FIM Vision/Perception  Vision - History Baseline Vision: Wears glasses only for reading Patient Visual Report: No change from baseline Vision - Assessment Eye Alignment: Within Functional Limits Perception Perception: Within Functional Limits Praxis Praxis: Intact  Cognition Overall Cognitive Status: Appears within functional limits for tasks  assessed Arousal/Alertness: Awake/alert Orientation Level: Oriented X4 Selective Attention: Appears intact Alternating Attention: Appears intact Memory: Appears intact Awareness: Appears intact Problem Solving: Appears intact Organizing: Appears intact Self Monitoring: Appears intact Self Correcting: Appears intact Safety/Judgment: Appears intact Sensation Sensation Light Touch: Impaired by gross assessment (R thumb, index, and middle fingers; B feet) Coordination Gross Motor Movements are Fluid and Coordinated: Yes Fine Motor Movements are Fluid and Coordinated: Yes (with c/o numbness in R fingers) Motor  Motor Motor: Within Functional Limits Mobility  Bed Mobility Left Sidelying to Sit: 6: Modified independent (Device/Increase time) Supine to Sit: 6: Modified independent (Device/Increase time) Sitting - Scoot to Edge of Bed: 6: Modified independent (Device/Increase time) Sit to Supine: 6: Modified independent (Device/Increase time) Transfers Sit to Stand: 6: Modified independent (Device/Increase time)  Trunk/Postural Assessment  Cervical Assessment Cervical Assessment: Within Functional Limits Thoracic Assessment Thoracic Assessment: Within Functional Limits Lumbar Assessment Lumbar Assessment: Within Functional Limits Postural Control Postural Control: Within Functional Limits  Balance Static Standing Balance Static Standing - Balance Support: During functional activity;No upper extremity supported Static Standing - Level of Assistance: 6: Modified independent (Device/Increase time) Extremity/Trunk Assessment RUE Assessment RUE Assessment: Within Functional Limits LUE Assessment LUE Assessment: Within Functional Limits  See FIM for current functional status  Jackelyn Poling 03/07/2012, 3:49 PM

## 2012-03-07 NOTE — Progress Notes (Signed)
Physical Therapy Note  Patient Details  Name: [name] MRN: [id number] Date of Birth: 1950-06-17 Today's Date: 03/07/2012  Time: 800-900 60 minutes  No c/o pain. Pt with continued c/o R hand numbness.  Dynamic gait training with speed changes, start/stop and cognitive challenges with mod I.  Gait while tossing, bouncing ball with mod I without LOB.  Car transfers with mod I.  Stair negotiation 1 flight with mod I.  Pt very fatigued after gait and stairs requiring prolonged rest breaks.  Biodex balance system for wt shifting and motor control, pt able to perform with supervision to min A for increased shifting out of BOS. Discussed with pt exercise program for home including walking with rest breaks 1-2 x a day.  Pt limited throughout session by fatigue.  Individual therapy   DONAWERTH,KAREN 03/07/2012, 8:56 AM

## 2012-03-07 NOTE — Progress Notes (Signed)
Occupational Therapy Session Note  Patient Details  Name: CAILEN MIHALIK MRN: [id number] Date of Birth: 10/23/1950  Today's Date: 03/07/2012 Time:  1100- 1200    Short Term Goals: Week 1:  OT Short Term Goal 1 (Week 1): STG=LTG  Skilled Therapeutic Interventions/Progress Updates:    session focused on safety and adherence to sternal precautions during functional mobility and self care tasks in preparation for return home. Pt performed shower transfer, stepping over ~4 inch threshold to simulate home environment Mod I. Completed bathing and dressing tasks Mod I and adhered to sternal precautions without cues. Pt still has decreased endurance with noted increase in fatigue during dressing tasks. Discussed d/c plans and pt states he feels ready to go home.  Therapy Documentation Precautions:  Precautions Precautions: Fall;Sternal Precaution Comments: Able to state sternal precautions with min vc, c/o decreased sensation in feet Restrictions Weight Bearing Restrictions: No Pain:  pt denies pain this session  See FIM for current functional status  Therapy/Group: Individual Therapy  Jackelyn Poling 03/07/2012, 1:12 PM

## 2012-03-08 ENCOUNTER — Ambulatory Visit: Payer: 59

## 2012-03-08 DIAGNOSIS — I319 Disease of pericardium, unspecified: Secondary | ICD-10-CM

## 2012-03-08 DIAGNOSIS — I803 Phlebitis and thrombophlebitis of lower extremities, unspecified: Secondary | ICD-10-CM

## 2012-03-08 DIAGNOSIS — G9341 Metabolic encephalopathy: Secondary | ICD-10-CM

## 2012-03-08 DIAGNOSIS — I7101 Dissection of thoracic aorta: Secondary | ICD-10-CM

## 2012-03-08 DIAGNOSIS — Z5189 Encounter for other specified aftercare: Secondary | ICD-10-CM

## 2012-03-08 LAB — CBC
HCT: 26 % — ABNORMAL LOW (ref 39.0–52.0)
MCHC: 32.3 g/dL (ref 30.0–36.0)
RDW: 15 % (ref 11.5–15.5)

## 2012-03-08 LAB — GLUCOSE, CAPILLARY

## 2012-03-08 LAB — PROTIME-INR: INR: 2.58 — ABNORMAL HIGH (ref 0.00–1.49)

## 2012-03-08 MED ORDER — SENNA 8.6 MG PO TABS: 1.0000 | ORAL_TABLET | Freq: Two times a day (BID) | ORAL | Status: DC

## 2012-03-08 MED ORDER — NICOTINE 21 MG/24HR TD PT24: 21.0000 | MEDICATED_PATCH | Freq: Every day | TRANSDERMAL | Status: DC

## 2012-03-08 MED ORDER — GLYBURIDE 5 MG PO TABS: 10.0000 mg | ORAL_TABLET | Freq: Every day | ORAL | Status: DC

## 2012-03-08 MED ORDER — WARFARIN SODIUM 5 MG PO TABS: 5.0000 mg | ORAL_TABLET | Freq: Every day | ORAL | Status: DC

## 2012-03-08 MED ORDER — PANTOPRAZOLE SODIUM 40 MG PO TBEC: 40.0000 mg | DELAYED_RELEASE_TABLET | Freq: Every day | ORAL | Status: DC

## 2012-03-08 MED ORDER — ATORVASTATIN CALCIUM 20 MG PO TABS: 20.0000 mg | ORAL_TABLET | Freq: Every day | ORAL | Status: DC

## 2012-03-08 MED ORDER — LIDOCAINE 5 % EX PTCH: 2.0000 | MEDICATED_PATCH | CUTANEOUS | Status: DC

## 2012-03-08 MED ORDER — WARFARIN SODIUM 2 MG PO TABS
2.0000 mg | ORAL_TABLET | Freq: Once | ORAL | Status: DC
  Filled 2012-03-08: qty 1

## 2012-03-08 MED ORDER — FE FUMARATE-B12-VIT C-FA-IFC PO CAPS: 1.0000 | ORAL_CAPSULE | Freq: Two times a day (BID) | ORAL | Status: DC

## 2012-03-08 MED ORDER — POTASSIUM CHLORIDE CRYS ER 20 MEQ PO TBCR: 20.0000 meq | EXTENDED_RELEASE_TABLET | Freq: Every day | ORAL | Status: DC

## 2012-03-08 MED ORDER — FUROSEMIDE 40 MG PO TABS: 40.0000 mg | ORAL_TABLET | Freq: Every day | ORAL | Status: DC

## 2012-03-08 NOTE — Progress Notes (Signed)
Social Work  Discharge Note  The overall goal for the admission was met for:   Discharge location: Yes - home with daughter to stay initially and provide 24/7 supervision  Length of Stay: Yes - 8 days  Discharge activity level: Yes - supervision to independent  Home/community participation: Yes  Services provided included: MD, RD, PT, OT, SLP, RN, TR, Pharmacy and SW  Financial Services: Private Insurance: [employer]  Follow-up services arranged: Outpatient: ST via [facility] and Patient/Family has no preference for HH/DME agencies  Comments (or additional information):  Patient/Family verbalized understanding of follow-up arrangements: Yes  Individual responsible for coordination of the follow-up plan: patient  Confirmed correct DME delivered: NA

## 2012-03-08 NOTE — Progress Notes (Signed)
This note has been reviewed and this clinician agrees with information provided.  

## 2012-03-08 NOTE — Progress Notes (Signed)
Called by Dr. Morton Peters relative to this patient's most recent echo exam revealing the pericardial effusion appears to be somewhat larger than last exam. Given the patient's INR = 2.58 with goal of 2.0, we elect to HOLD/OMIT today's dose of warfarin. Order to NOT give warfarin/coumadin has been entered, medication administration record amended to reflect the same, RN assigned to patient has been apprised to NOT give today's dose by me--and finally, patient understands and acknowledges that he is NOT to take warfarin/Coumadin today. Patient WILL get his prescription for warfarin/Coumadin filled/dispensed by his pharmacy [facility]). They have been called and advised to amend the written instructions/product label to reflect:  Take as directed by anticoagulation clinic provider. I will see the patient in my anticoagulation management clinic within the [facility] tomorrow September 26 at 10:00AM. Patient and his daughter who will be providing transportation are both aware. Patient has been given my name, appointment time, day/date and phone contact number.

## 2012-03-08 NOTE — Progress Notes (Signed)
ANTICOAGULATION CONSULT NOTE - Follow Up Consult  Pharmacy Consult:  Coumadin Indication:  DVT  Allergies  Allergen Reactions  . Testosterone Shortness Of Breath    Medication:Androgel Pt states trouble breathing at night    Patient Measurements: Height: 6' 3.98" (193 cm) Weight: 231 lb 7.7 oz (105 kg) IBW/kg (Calculated) : 86.76  Heparin Dosing Weight: 102 kg  Vital Signs: Temp: 98.1 F (36.7 C) (09/25 0530) Temp src: Oral (09/25 0530) BP: 114/69 mmHg (09/25 0530) Pulse Rate: 79  (09/25 0530)  Labs:  Basename 03/08/12 0534 03/07/12 0500 03/06/12 0626  HGB 8.4* 8.2* --  HCT 26.0* 25.4* 26.6*  PLT 223 219 202  APTT -- -- --  LABPROT 26.4* 22.7* 22.7*  INR 2.Ryan* 2.10* 2.10*  HEPARINUNFRC -- -- 0.Ryan  CREATININE -- -- --  CKTOTAL -- -- --  CKMB -- -- --  TROPONINI -- -- --    Estimated Creatinine Clearance: 126.1 ml/min (by C-G formula based on Cr of 0.85).       Assessment: [name] continues on Coumadin for calf DVT.  Noted patient with high risk of bleeding.  INR above goal today and will give a smaller dose to bring INR down towards 2.  No bleeding reported.   Admit Complaint: Code Stroke  Anticoagulation: Coumadin for calf DVT, caution d/t risk of pericardial hemorrhage, INR up 2.Ryan, no bleeding - keep INR closer toward 2, pt at risk for tamponade (MD stressed INR needs to be at 2, difficult to attain). Coumadin educated completed 03/04/12/ Cardiovascular: s/p emergent AAA 02/20/12 / HLD - VSS - ASA, Lipitor, lopressor, KCL Endocrinology: A1c 7.5 - hypoglycemia 9/22 afternoon and 9/23 morning, CBGs normal since, on glyburide + SSI Gastrointestinal / Nutrition: heart diet, Senna, Protonix Neurology: hx migraine, post-op encephalopathy, on Singulair, nicotine patch, lidocaine patch. C/o back/anxillary pain - MD thinks d/t strain Nephrology: 9/18 labs - SCr 0.85 (stable), good UOP Pulmonary: hx OSA / +tobacco / asthma - stable on RA Hematology / Oncology: hgb low  but stable - on PO iron, plts WNL Home meds not yet resumed: Vit D, B-12, Vytorin (Lipitor here), Lovaza, prednisone Best Practices: Coumadin, PPI PO Dispo: has met all rehab goals, need to return to acute side of hospital   Goal of Therapy:  INR 2 per MD Monitor platelets by anticoagulation protocol: Yes    Plan:  - Decrease Coumadin to 2mg  PO today - Daily PT / INR - CBC weekly    Grainne Knights D. Laney Potash, PharmD, BCPS Pager:  [phone] 03/08/2012, 8:28 AM

## 2012-03-08 NOTE — Progress Notes (Signed)
Physical Therapy Discharge Summary  Patient Details  Name: MOOSA BUECHE MRN: [id number] Date of Birth: 1951-03-20  Today's Date: 03/08/2012  Patient has met 7 of 7 long term goals due to improved activity tolerance, improved balance, increased strength and ability to compensate for deficits.  Patient to discharge at an ambulatory level Modified Independent.  Pt mod I with all functional mobility and is now limited by poor cardiorespiratory endurance and decreased activity tolerance which he plans to address with cardiac rehab after d/c from the hospital.   Reasons goals not met: n/a  Recommendation:  Patient will benefit from ongoing home walking program and has plans to follow up with cardiac rehab.  Equipment: No equipment provided  Reasons for discharge: treatment goals met  Patient/family agrees with progress made and goals achieved: Yes  PT Discharge  Cognition Overall Cognitive Status: Appears within functional limits for tasks assessed Sensation Sensation Light Touch: Impaired by gross assessment (R hand "numb") Proprioception: Impaired by gross assessment (R UE) Coordination Gross Motor Movements are Fluid and Coordinated: Yes Motor  Motor Motor: Within Functional Limits   Trunk/Postural Assessment  Cervical Assessment Cervical Assessment: Within Functional Limits Thoracic Assessment Thoracic Assessment: Within Functional Limits Lumbar Assessment Lumbar Assessment: Within Functional Limits Postural Control Postural Control: Within Functional Limits  Balance Static Standing Balance Static Standing - Level of Assistance: 6: Modified independent (Device/Increase time) Extremity Assessment      RLE Assessment RLE Assessment: Within Functional Limits LLE Assessment LLE Assessment: Within Functional Limits  See FIM for current functional status  Erline Siddoway 03/08/2012, 7:48 AM

## 2012-03-08 NOTE — Progress Notes (Signed)
Patient ID: [name], male   DOB: Feb 03, 1951, 61 y.o.   MRN: [id number] Subjective/Complaints: More swelling noted in legs yesterday. Complains of left rib cage pain. No SOB  A 12 point review of systems has been performed and if not noted above is otherwise negative.   Objective: Vital Signs: Blood pressure 114/69, pulse 79, temperature 98.1 F (36.7 C), temperature source Oral, resp. rate 18, height 6' 3.98" (1.93 m), weight 105 kg (231 lb 7.7 oz), SpO2 96.00%. Dg Chest 2 View  03/07/2012  *RADIOLOGY REPORT*  Clinical Data: Shortness of breath, chest pain.  CHEST - 2 VIEW  Comparison: 02/29/2012  Findings: Cardiomegaly and mediastinal prominence is similar to prior.  Status post mediasternotomy.  Surgical clips project over the right axilla.  There is a small left pleural effusion, increased in size in the interval.  Associate consolidations; atelectasis versus infiltrate.  No pneumothorax identified.  Left PICC with catheter tip projecting over the proximal SVC.  IMPRESSION: Prominent cardiomediastinal contours are similar to prior.  Increased left pleural effusion with associated consolidation; atelectasis versus infiltrate.   Original Report Authenticated By: Waneta Martins, M.D.     Basename 03/08/12 0534 03/07/12 0500  WBC 6.9 7.5  HGB 8.4* 8.2*  HCT 26.0* 25.4*  PLT 223 219   No results found for this basename: NA:2,K:2,CL:2,CO2:2,GLUCOSE:2,BUN:2,CREATININE:2,CALCIUM:2 in the last 72 hours CBG (last 3)   Basename 03/07/12 2117 03/07/12 1639 03/07/12 1117  GLUCAP 123* 80 130*    Wt Readings from Last 3 Encounters:  03/06/12 105 kg (231 lb 7.7 oz)  02/29/12 103.465 kg (228 lb 1.6 oz)  02/29/12 103.465 kg (228 lb 1.6 oz)    Physical Exam:  HENT:  Head: Normocephalic.  Eyes:  Pupils round and reactive to light , oral mucosa pink and moist  Neck: Neck supple. No thyromegaly present.  Cardiovascular: Normal rate and regular rhythm. No murmurs  Pulmonary/Chest:  Breath sounds normal. No respiratory distress. He has no wheezes.  Abdominal: Bowel sounds are normal. He exhibits no distension. There is no tenderness.  Musculoskeletal: He exhibits edema. Mild calf tenderness, low back tenderness ato L5-S1. Less Tenderness at right axilla. Is slightly tender at lateral angle of ribs.  Neurological: He is alert and oriented to person, place, and time. He has normal reflexes. CN normal Follows three-step commands. Thought processing much improved. . Mild weakness with biceps and wrist extension, 4/5. [name] Tingling on palmar 1st and 2nd fingers of right hand. Good sitting balance. General weakness in legs 3-4+ proximal weaker than distal. DTR's are grossly 1+  Skin:  Chest incision clean and dry, right shoulder incision intact as well.  Psychiatric: He has a normal mood and affect    Assessment/Plan: 1. Functional deficits secondary to AAA repair, deconditioning, encephalopathy, mild brachial plexus injury which require 3+ hours per day of interdisciplinary therapy in a comprehensive inpatient rehab setting. Physiatrist is providing close team supervision and 24 hour management of active medical problems listed below. Physiatrist and rehab team continue to assess barriers to discharge/monitor patient progress toward functional and medical goals. FIM: FIM - Bathing Bathing Steps Patient Completed: Chest;Right lower leg (including foot);Right Arm;Left Arm;Abdomen;Front perineal area;Left lower leg (including foot);Buttocks;Right upper leg;Left upper leg Bathing: 6: Assistive device (Comment)  FIM - Upper Body Dressing/Undressing Upper body dressing/undressing steps patient completed: Thread/unthread right sleeve of pullover shirt/dresss;Thread/unthread left sleeve of pullover shirt/dress;Put head through opening of pull over shirt/dress;Pull shirt over trunk Upper body dressing/undressing: 7: Complete Independence: No helper FIM -  Lower Body  Dressing/Undressing Lower body dressing/undressing steps patient completed: Thread/unthread right underwear leg;Thread/unthread left underwear leg;Pull underwear up/down;Thread/unthread right pants leg;Thread/unthread left pants leg;Pull pants up/down;Fasten/unfasten pants;Don/Doff right shoe;Don/Doff left shoe;Fasten/unfasten right shoe;Fasten/unfasten left shoe Lower body dressing/undressing: 6: More than reasonable amount of time  FIM - Toileting Toileting steps completed by patient: Adjust clothing prior to toileting;Performs perineal hygiene;Adjust clothing after toileting Toileting: 7: Independent: No helper, no device  FIM - Diplomatic Services operational officer Devices: Art gallery manager Transfers: 6-Assistive device: No helper  FIM - Banker Devices: Therapist, occupational: 6: More than reasonable amt of time  FIM - Locomotion: Wheelchair Locomotion: Wheelchair: 0: Activity did not occur FIM - Locomotion: Ambulation Locomotion: Ambulation Assistive Devices: Designer, industrial/product Ambulation/Gait Assistance: 5: Supervision Locomotion: Ambulation: 5: Travels 150 ft or more with supervision/safety issues  Comprehension Comprehension Mode: Auditory Comprehension: 6-Follows complex conversation/direction: With extra time/assistive device  Expression Expression Mode: Verbal Expression: 6-Expresses complex ideas: With extra time/assistive device  Social Interaction Social Interaction Mode: Asleep Social Interaction: 6-Interacts appropriately with others with medication or extra time (anti-anxiety, antidepressant).  Problem Solving Problem Solving: 5-Solves complex 90% of the time/cues < 10% of the time  Memory Memory: 5-Recognizes or recalls 90% of the time/requires cueing < 10% of the time Medical Problem List and Plan:  1. Thoracic aortic aneurysm status post emergent repair with postop encephalopathy 02/20/2012  2. DVT  Prophylaxis/Anticoagulation: left posterior tibial vein DVT- coumadin therapeutic--dc heparin  -activity as tolerated  3. Pain Management: Vicodin as needed. Monitor with increased mobility   -added lidoderm patches  -kpad, muscle rub  -has hx of low back pain PTA  -axillary pain-- Don't think this pain is nerve related. May be mild muscle/tendon strain. 4. Mood/anxiety. Patient on Xanax in the past. Will monitor closely and provide emotional support   -eager to leave hospital 5. Neuropsych: This patient is capable of making decisions on his/her own behalf.  6. Postoperative anemia. hgb 8.4 today.  -modest INR goal for coumadin to avoid tamponade 7. Hypertension. Lopressor 25 mg twice a day. Monitor with increased mobility  8. Elevated blood sugars. Hemoglobin A1c of 7.5. Continue Lantus insulin- dc today.increase glyburide to 10mg .   -reviewed glycemic control with patient  9. Asthma/tobacco abuse. Continue NicoDerm patch. Singulair 10 mg daily, Flonase daily. Provide counseling in regards to tobacco abuse  10. Hyperlipidemia. Lipitor 11. Pleural effusion-   -lasix  -?echo  -if he needs to be here for a few more days to manage this issue, then he needs to return to the acute side of hospital. HE HAS MET ALL REHAB GOALS AT THIS TIME.   LOS (Days) 8 A FACE TO FACE EVALUATION WAS PERFORMED  SWARTZ,ZACHARY T 03/08/2012, 7:16 AM

## 2012-03-08 NOTE — Progress Notes (Signed)
Patient discharge to home with daughter at 1440.  PICC line discontinued prior to discharged, patient did well, did not note any complications.  Discharge instruction provided by Harvel Ricks, PA.  Patient verbalize understanding, no further questions asked.  Patient escorted off unit with NT.

## 2012-03-08 NOTE — Progress Notes (Signed)
  Echocardiogram 2D Echocardiogram limited has been performed.  Sharan Mcenaney FRANCES 03/08/2012, 10:54 AM

## 2012-03-08 NOTE — Discharge Summary (Signed)
[name], TRIER NO.:  [id number]  MEDICAL RECORD NO.:  [id number]  LOCATION:  4038                         FACILITY:  [facility]  PHYSICIAN:  Ranelle Oyster, M.D.DATE OF BIRTH:  06/04/1951  DATE OF ADMISSION:  02/29/2012 DATE OF DISCHARGE:  03/08/2012                              DISCHARGE SUMMARY   DISCHARGE ADDENDUM  Please see full discharge summary dictated for all issues in regard to the patient's rehab hospital stay.  The patient also noted during his rehabilitation stay with followup chest x-ray per Dr. Donata Clay showing an increased left pleural effusion on chest x-ray, March 06, 2012. Echocardiogram completed showing moderate pericardial effusion.  No evidence of tamponade.  Dr. Donata Clay was aware of the result, but still felt to discharge can take place on March 08, 2012.  The patient would continue on Coumadin therapy, which was being monitored closely for findings of DVT during his hospital stay with recommendations for INR to be 2.0, no higher than 2.5 with latest INR of 2.58 on discharge. Dr. Chancy Milroy, Pharmacy Services was to continue to follow Coumadin closely.  The patient was to follow up with Dr. Kathlee Nations Trigt whose office was to call to make followup appointments.  On the day of discharge, his latest hemoglobin was 8.4, hematocrit 26.  Noted 1 day prior hemoglobin 8.2.  Close monitoring for any bleeding episodes.  For all medications, please see initial discharge summary.     Mariam Dollar, P.A.   ______________________________ Ranelle Oyster, M.D.    DA/MEDQ  D:  03/08/2012  T:  03/08/2012  Job:  [id number]  cc:   Vikki Ports A. Felicity Coyer, MD Kerin Perna, M.D.

## 2012-03-09 ENCOUNTER — Ambulatory Visit (INDEPENDENT_AMBULATORY_CARE_PROVIDER_SITE_OTHER): Payer: 59 | Admitting: Pharmacist

## 2012-03-09 ENCOUNTER — Encounter: Payer: Self-pay | Admitting: Internal Medicine

## 2012-03-09 ENCOUNTER — Other Ambulatory Visit: Payer: Self-pay | Admitting: Cardiothoracic Surgery

## 2012-03-09 DIAGNOSIS — Z7901 Long term (current) use of anticoagulants: Secondary | ICD-10-CM

## 2012-03-09 DIAGNOSIS — I824Y9 Acute embolism and thrombosis of unspecified deep veins of unspecified proximal lower extremity: Secondary | ICD-10-CM

## 2012-03-09 DIAGNOSIS — J90 Pleural effusion, not elsewhere classified: Secondary | ICD-10-CM

## 2012-03-09 LAB — POCT INR: INR: 2.3

## 2012-03-09 NOTE — Patient Instructions (Signed)
Patient instructed to take medications as defined in the Anti-coagulation Track section of this encounter.  Patient instructed to take today's dose.  Patient verbalized understanding of these instructions.    

## 2012-03-09 NOTE — Progress Notes (Signed)
Anti-Coagulation Progress Note  [name] is a 61 y.o. male who is currently on an anti-coagulation regimen.    RECENT RESULTS: Recent results are below, the most recent result is correlated with a dose of 7.5mg  qd x 4 days which commenced in-hospital on March 01, 2012. On March 05, 2012 his INR was 1.93. A decision by the inpatient pharmacy was to dose 5mg  warfarin.  On March 06, 2012 his INR was 2.1. A decision by the inpatient pharmacy was to dose 7.5mg  warfarin. On March 07, 2012 his INR was 2.1. A decision by the inpatient pharmacy was to dose 6mg  warfarin. On March 08, 2012 his INR was 2.58. A decision--given Dr. Zenaida Niece Trigt's concerns were to OMIT/HOLD that day's dosing of warfarin. Today's INR is shown below. Extensive patient education under taken to explain the careful balance of preventing extension of VTE while making certain that the potential for exacerbation of existing pericardial effusion does not occur. Patient and daughter verbalized understanding of the careful balance we must strike. Will see patient tomorrow at 1000h for repeat INR. Lab Results  Component Value Date   INR 2.3 03/09/2012   INR 2.58* 03/08/2012   INR 2.10* 03/07/2012    ANTI-COAG DOSE:   Latest dosing instructions   Total Glynis Smiles Tue Wed Thu Fri Sat   2.5     2.5 mg          (5 mg0.5)           ANTICOAG SUMMARY: Anticoagulation Episode Summary              Current INR goal 2.0 Next INR check 03/10/2012   INR from last check 2.3! (03/09/2012)     Weekly max dose (mg)  Target end date    Indications Encounter for long-term (current) use of anticoagulants, Acute venous embolism and thrombosis of deep vessels of proximal lower extremity   INR check location [facility] Preferred lab    Send INR reminders to    Comments Discharged from hospital 25-SEP-13 after acute admission for emergency surgery early September for triple A repair of class A aneurysm by Dr. Morton Peters.  Intra-operatively, PLCT fell to 40,000. Patient required administration of rFVIIa. Subsequently despite efforts of mechanical prophylaxis against VTE postoperatively, patient did develop lower extremity DVT of left leg. I would consider this a "known provoked" DVT (secondary to rFVIIa). Given this, until suggested otherwise, will target 59mo Tx.            ANTICOAG TODAY: Anticoagulation Summary as of 03/09/2012              INR goal 2.0     Selected INR 2.3! (03/09/2012) Next INR check 03/10/2012   Weekly max dose (mg)  Target end date    Indications Encounter for long-term (current) use of anticoagulants, Acute venous embolism and thrombosis of deep vessels of proximal lower extremity    Anticoagulation Episode Summary              INR check location [facility] Preferred lab    Send INR reminders to    Comments Discharged from hospital 25-SEP-13 after acute admission for emergency surgery early September for triple A repair of class A aneurysm by Dr. Morton Peters. Intra-operatively, PLCT fell to 40,000. Patient required administration of rFVIIa. Subsequently despite efforts of mechanical prophylaxis against VTE postoperatively, patient did develop lower extremity DVT of left leg. I would consider this a "known provoked" DVT (secondary to rFVIIa). Given this,  until suggested otherwise, will target 83mo Tx.            PATIENT INSTRUCTIONS: Patient Instructions  Patient instructed to take medications as defined in the Anti-coagulation Track section of this encounter.  Patient instructed to take today's dose.  Patient verbalized understanding of these instructions.        FOLLOW-UP Return in 1 day (on 03/10/2012) for Follow up INR at 1000h.  Hulen Luster, III Pharm.D., CACP

## 2012-03-10 ENCOUNTER — Ambulatory Visit (INDEPENDENT_AMBULATORY_CARE_PROVIDER_SITE_OTHER): Payer: 59 | Admitting: Pharmacist

## 2012-03-10 ENCOUNTER — Ambulatory Visit: Payer: 59

## 2012-03-10 DIAGNOSIS — I824Y9 Acute embolism and thrombosis of unspecified deep veins of unspecified proximal lower extremity: Secondary | ICD-10-CM

## 2012-03-10 DIAGNOSIS — Z7901 Long term (current) use of anticoagulants: Secondary | ICD-10-CM

## 2012-03-10 NOTE — Patient Instructions (Signed)
Patient instructed to take medications as defined in the Anti-coagulation Track section of this encounter.  Patient instructed to take today's dose.  Patient verbalized understanding of these instructions.    

## 2012-03-10 NOTE — Progress Notes (Signed)
Anti-Coagulation Progress Note  [name] is a 60 y.o. male who is currently on an anti-coagulation regimen.    RECENT RESULTS: Recent results are below, the most recent result is correlated with a dose of 41 mg over the past 7 days.  Lab Results  Component Value Date   INR 1.90 03/10/2012   INR 2.3 03/09/2012   INR 2.58* 03/08/2012    ANTI-COAG DOSE:   Latest dosing instructions   Total Sun Mon Tue Wed Thu Fri Sat   17.5 5 mg    2.5 mg 5 mg 5 mg    (5 mg1)    (5 mg0.5) (5 mg1) (5 mg1)         ANTICOAG SUMMARY: Anticoagulation Episode Summary              Current INR goal 2.0 Next INR check 03/13/2012   INR from last check 1.90! (03/10/2012)     Weekly max dose (mg)  Target end date    Indications Encounter for long-term (current) use of anticoagulants, Acute venous embolism and thrombosis of deep vessels of proximal lower extremity   INR check location [facility] Preferred lab    Send INR reminders to    Comments Discharged from hospital 25-SEP-13 after acute admission for emergency surgery early September for triple A repair of class A aneurysm by Dr. Morton Peters. Intra-operatively, PLCT fell to 40,000. Patient required administration of rFVIIa. Subsequently despite efforts of mechanical prophylaxis against VTE postoperatively, patient did develop lower extremity DVT of left leg. I would consider this a "known provoked" DVT (secondary to rFVIIa). Given this, until suggested otherwise, will target 17mo Tx.            ANTICOAG TODAY: Anticoagulation Summary as of 03/10/2012              INR goal 2.0     Selected INR 1.90! (03/10/2012) Next INR check 03/13/2012   Weekly max dose (mg)  Target end date    Indications Encounter for long-term (current) use of anticoagulants, Acute venous embolism and thrombosis of deep vessels of proximal lower extremity    Anticoagulation Episode Summary              INR check location [facility] Preferred lab    Send INR  reminders to    Comments Discharged from hospital 25-SEP-13 after acute admission for emergency surgery early September for triple A repair of class A aneurysm by Dr. Morton Peters. Intra-operatively, PLCT fell to 40,000. Patient required administration of rFVIIa. Subsequently despite efforts of mechanical prophylaxis against VTE postoperatively, patient did develop lower extremity DVT of left leg. I would consider this a "known provoked" DVT (secondary to rFVIIa). Given this, until suggested otherwise, will target 17mo Tx.            PATIENT INSTRUCTIONS: Patient Instructions  Patient instructed to take medications as defined in the Anti-coagulation Track section of this encounter.  Patient instructed to take today's dose.  Patient verbalized understanding of these instructions.        FOLLOW-UP Return in 3 days (on 03/13/2012) for Follow up INR at 1000h.  Hulen Luster, III Pharm.D., CACP

## 2012-03-13 ENCOUNTER — Ambulatory Visit (HOSPITAL_COMMUNITY): Payer: 59 | Attending: Cardiology | Admitting: Radiology

## 2012-03-13 ENCOUNTER — Ambulatory Visit (INDEPENDENT_AMBULATORY_CARE_PROVIDER_SITE_OTHER): Payer: 59 | Admitting: Pharmacist

## 2012-03-13 DIAGNOSIS — I824Y9 Acute embolism and thrombosis of unspecified deep veins of unspecified proximal lower extremity: Secondary | ICD-10-CM

## 2012-03-13 DIAGNOSIS — Z7901 Long term (current) use of anticoagulants: Secondary | ICD-10-CM

## 2012-03-13 DIAGNOSIS — J90 Pleural effusion, not elsewhere classified: Secondary | ICD-10-CM

## 2012-03-13 DIAGNOSIS — I319 Disease of pericardium, unspecified: Secondary | ICD-10-CM | POA: Insufficient documentation

## 2012-03-13 LAB — POCT INR: INR: 2

## 2012-03-13 NOTE — Patient Instructions (Addendum)
Patient instructed to take medications as defined in the Anti-coagulation Track section of this encounter.  Patient instructed to take today's dose.  Patient verbalized understanding of these instructions.    

## 2012-03-13 NOTE — Progress Notes (Signed)
Agree 

## 2012-03-13 NOTE — Progress Notes (Signed)
Anti-Coagulation Progress Note  [name] is a 61 y.o. male who is currently on an anti-coagulation regimen.    RECENT RESULTS: Recent results are below, the most recent result is correlated with a dose of 5mg  each day since last Friday. Lab Results  Component Value Date   INR 2.0 03/13/2012   INR 1.90 03/10/2012   INR 2.3 03/09/2012    ANTI-COAG DOSE:   Latest dosing instructions   Total Sun Mon Tue Wed Thu Fri Sat   40 5 mg 7.5 mg 5 mg 5 mg 7.5 mg 5 mg 5 mg    (5 mg1) (5 mg1.5) (5 mg1) (5 mg1) (5 mg1.5) (5 mg1) (5 mg1)         ANTICOAG SUMMARY: Anticoagulation Episode Summary              Current INR goal 2.0 Next INR check 03/17/2012   INR from last check 2.0 (03/13/2012)     Weekly max dose (mg)  Target end date    Indications Encounter for long-term (current) use of anticoagulants, Acute venous embolism and thrombosis of deep vessels of proximal lower extremity   INR check location [facility] Preferred lab    Send INR reminders to    Comments Discharged from hospital 25-SEP-13 after acute admission for emergency surgery early September for triple A repair of class A aneurysm by Dr. Morton Peters. Intra-operatively, PLCT fell to 40,000. Patient required administration of rFVIIa. Subsequently despite efforts of mechanical prophylaxis against VTE postoperatively, patient did develop lower extremity DVT of left leg. I would consider this a "known provoked" DVT (secondary to rFVIIa). Given this, until suggested otherwise, will target 538mo Tx.            ANTICOAG TODAY: Anticoagulation Summary as of 03/13/2012              INR goal 2.0     Selected INR 2.0 (03/13/2012) Next INR check 03/17/2012   Weekly max dose (mg)  Target end date    Indications Encounter for long-term (current) use of anticoagulants, Acute venous embolism and thrombosis of deep vessels of proximal lower extremity    Anticoagulation Episode Summary              IN[facility] check location [facility]    Send INR reminders to    Comments Discharged from hospital 25-SEP-13 after acute admission for emergency surgery early September for triple A repair of class A aneurysm by Dr. Morton Peters. Intra-operatively, PLCT fell to 40,000. Patient required administration of rFVIIa. Subsequently despite efforts of mechanical prophylaxis against VTE postoperatively, patient did develop lower extremity DVT of left leg. I would consider this a "known provoked" DVT (secondary to rFVIIa). Given this, until suggested otherwise, will target 538mo Tx.            PATIENT INSTRUCTIONS: Patient Instructions  Patient instructed to take medications as defined in the Anti-coagulation Track section of this encounter.  Patient instructed to take today's dose.  Patient verbalized understanding of these instructions.        FOLLOW-UP Return in 4 days (on 03/17/2012) for Follow up INR at 0930h.  Hulen Luster, III Pharm.D., CACP

## 2012-03-13 NOTE — Progress Notes (Signed)
Stress Echocardiogram performed.  

## 2012-03-14 ENCOUNTER — Encounter: Payer: Self-pay | Admitting: Internal Medicine

## 2012-03-14 ENCOUNTER — Ambulatory Visit (INDEPENDENT_AMBULATORY_CARE_PROVIDER_SITE_OTHER): Payer: 59 | Admitting: Internal Medicine

## 2012-03-14 ENCOUNTER — Telehealth: Payer: Self-pay | Admitting: Pharmacist

## 2012-03-14 ENCOUNTER — Telehealth: Payer: Self-pay | Admitting: Internal Medicine

## 2012-03-14 ENCOUNTER — Ambulatory Visit: Payer: 59 | Admitting: Endocrinology

## 2012-03-14 ENCOUNTER — Other Ambulatory Visit (INDEPENDENT_AMBULATORY_CARE_PROVIDER_SITE_OTHER): Payer: 59

## 2012-03-14 VITALS — BP 110/62 | HR 79 | Temp 97.0°F | Ht 76.0 in | Wt 228.2 lb

## 2012-03-14 DIAGNOSIS — R21 Rash and other nonspecific skin eruption: Secondary | ICD-10-CM | POA: Insufficient documentation

## 2012-03-14 DIAGNOSIS — IMO0001 Reserved for inherently not codable concepts without codable children: Secondary | ICD-10-CM

## 2012-03-14 LAB — HEMOGLOBIN A1C: Hgb A1c MFr Bld: 6 % (ref 4.6–6.5)

## 2012-03-14 LAB — LIPID PANEL: HDL: 30.2 mg/dL — ABNORMAL LOW (ref >39.00)

## 2012-03-14 LAB — BASIC METABOLIC PANEL
CO2: 26 mEq/L (ref 19–32)
Glucose, Bld: 164 mg/dL — ABNORMAL HIGH (ref 70–99)
Potassium: 4.3 mEq/L (ref 3.5–5.1)
Sodium: 137 mEq/L (ref 135–145)

## 2012-03-14 LAB — HEPATIC FUNCTION PANEL
AST: 27 U/L (ref 0–37)
Albumin: 3.5 g/dL (ref 3.5–5.2)
Alkaline Phosphatase: 61 U/L (ref 39–117)
Total Protein: 7.6 g/dL (ref 6.0–8.3)

## 2012-03-14 MED ORDER — CLOTRIMAZOLE-BETAMETHASONE 1-0.05 % EX CREA: TOPICAL_CREAM | CUTANEOUS | Status: DC

## 2012-03-14 NOTE — Patient Instructions (Addendum)
Take all new medications as prescribed  - the generic lotrisone (sent to your pharmacy) OK to take the 100 mg diflucan you have at home - 1 per day for 3 days Please refrain from wearing tight clothing or rougher material clothing to avoid chafing Continue all other medications as before, including the coumadin as prescribed Please go to LAB in the Basement for the blood and/or urine tests to be done today You will be contacted by phone if any changes need to be made immediately.  Otherwise, you will receive a letter about your results with an explanation. Please remember to sign up for My Chart at your earliest convenience, as this will be important to you in the future with finding out test results. Please keep your appointment tomorrow with Dr Felicity Coyer as scheduled

## 2012-03-14 NOTE — Assessment & Plan Note (Signed)
With marked chafing and irritation I suspect related to recent new clothing from [facility] [employer] [facility] postop that has been tighter and irritating;  To d/c these new recent clothing., for lotrisone prn, has diflucan 100 qd for 3 days at home - ok to take this;  Could consider topical triple antibx but dont think he needs this

## 2012-03-14 NOTE — Telephone Encounter (Signed)
Patient calling, has a rash on both sides of his groin.  He noticed same this am.  It is not painful or itching. He is on Coumadin, s/p AAA repair on 9/6.  There is not drainage from the rash.  The rash is purple/red in color in patches. He had his last INR checked 9/30 at 2.0.  Last dose was 7.5mg .   Needs to be seen, scheduled for 3p with Dr. Everardo All.

## 2012-03-14 NOTE — Progress Notes (Signed)
Agree with plan 

## 2012-03-14 NOTE — Progress Notes (Signed)
Subjective:    Patient ID: [name], male    DOB: [birth year], 61 y.o.   MRN: [id number]  HPI  Pt seen as walkin today with acute onset 2 days bilat upper inner thigh/groin rash, with recent clothing change - family bought him new shorts and some tight and one in particular is a scratchy material.  Rash without itch or pain but has several ? Bumps, wondering about cellulitis as well.    Is most recently s/p ascending aorta repair sept 8, now on coumadin, pt denies any sort of trauma such as sitting down hard or similar, INR 2.0 very recently for slight increased coumadin and has planned f/u;  No other overt bleeding or bruising; feels stiff and sore in mult areas but ambulates without assist.  Due to see Dr Felicity Coyer tomorrow incidentally for DM postop, needs labs done.  Was supposed to see Dr Everardo All for this appt but did not know Dr Everardo All recently moved his office Past Medical History  Diagnosis Date  . OBSTRUCTIVE SLEEP APNEA 02/2008 sleep study  . MIGRAINE HEADACHE   . ERECTILE DYSFUNCTION, ORGANIC   . Unspecified vitamin D deficiency   . HYPERLIPIDEMIA   . ASTHMA   . ALLERGIC RHINITIS    Past Surgical History  Procedure Date  . Nasal fx without repair   . Thoracic aortic aneurysm repair 02/20/2012    Procedure: THORACIC ASCENDING ANEURYSM REPAIR (AAA);  Surgeon: Kerin Perna, MD;  Location: [facility] OR;  Service: Open Heart Surgery;  Laterality: N/A;    reports that he has been smoking.  He does not have any smokeless tobacco history on file. He reports that he drinks alcohol. His drug history not on file. family history includes Diabetes in his mother; Heart disease in his cousin, maternal uncle, and paternal uncle; and Lung cancer in his cousin. Allergies  Allergen Reactions  . Testosterone Shortness Of Breath    Medication:Androgel Pt states trouble breathing at night   Current Outpatient Prescriptions on File Prior to Visit  Medication Sig Dispense Refill  . aspirin 81 MG  chewable tablet Chew 1 tablet (81 mg total) by mouth daily.      [name] atorvastatin (LIPITOR) 20 MG tablet Take 1 tablet (20 mg total) by mouth daily at 6 PM.  30 tablet  1  . cholecalciferol (VITAMIN D) 1000 UNITS tablet Take 2,000 Units by mouth daily.      . Cyanocobalamin (VITAMIN B-12 PO) Take 1 tablet by mouth daily.      [name] EPINEPHrine (EPI-PEN) 0.3 mg/0.3 mL DEVI Inject 0.3 mg into the muscle as needed. For anaphylaxis       . ferrous fumarate-b12-vitamic C-folic acid (TRINSICON / FOLTRIN) capsule Take 1 capsule by mouth 2 (two) times daily after a meal.  60 capsule  1  . fexofenadine (ALLEGRA) 180 MG tablet Take 180 mg by mouth daily.        . fluticasone (FLONASE) 50 MCG/ACT nasal spray Place 2 sprays into the nose daily.      . furosemide (LASIX) 40 MG tablet Take 1 tablet (40 mg total) by mouth daily.  4 tablet  0  . glyBURIDE (DIABETA) 5 MG tablet Take 2 tablets (10 mg total) by mouth daily with breakfast.  30 tablet  1  . HYDROcodone-acetaminophen (NORCO/VICODIN) 5-325 MG per tablet Take 1 tablet by mouth every 4 (four) hours as needed.  30 tablet  0  . lidocaine (LIDODERM) 5 % Place 2 patches onto the skin daily.  Remove & Discard patch within 12 hours or as directed by MD  15 patch  0  . metoprolol tartrate (LOPRESSOR) 25 MG tablet Take 1 tablet (25 mg total) by mouth 2 (two) times daily.      . montelukast (SINGULAIR) 10 MG tablet Take 10 mg by mouth daily.      [name] omega-3 acid ethyl esters (LOVAZA) 1 G capsule Take 1 g by mouth 2 (two) times daily.      . pantoprazole (PROTONIX) 40 MG tablet Take 1 tablet (40 mg total) by mouth daily at 12 noon.  30 tablet  1  . potassium chloride SA (K-DUR,KLOR-CON) 20 MEQ tablet Take 1 tablet (20 mEq total) by mouth daily.  30 tablet  1  . predniSONE (DELTASONE) 5 MG tablet Take 5 mg by mouth daily as needed. For asthma flare up      . senna (SENOKOT) 8.6 MG TABS Take 1 tablet (8.6 mg total) by mouth 2 (two) times daily.  120 each    . tretinoin  (RETIN-A) 0.1 % cream Apply 1 application topically daily as needed. For acne or rash      . warfarin (COUMADIN) 5 MG tablet Take 1 tablet (5 mg total) by mouth at bedtime.  30 tablet  1  . ezetimibe-simvastatin (VYTORIN) 10-40 MG per tablet Take 0.5 tablets by mouth daily.      . nicotine (NICODERM CQ - DOSED IN MG/24 HOURS) 21 mg/24hr patch Place 21 patches onto the skin daily.  28 patch  1  . rosuvastatin (CRESTOR) 10 MG tablet Take 10 mg by mouth daily.       Review of Systems All otherwise neg per pt    Objective:   Physical Exam BP 110/62  Pulse 79  Temp 97 F (36.1 C) (Oral)  Ht 6\' 4"  (1.93 m)  Wt 228 lb 4 oz (103.534 kg)  BMI 27.78 kg/m2  SpO2 97% Physical Exam  VS noted Constitutional: Pt appears well-developed and well-nourished.  HENT: Head: Normocephalic.  Right Ear: External ear normal.  Left Ear: External ear normal.  Eyes: Conjunctivae and EOM are normal. Pupils are equal, round, and reactive to light.  Neck: Normal range of motion. Neck supple.  Cardiovascular: Normal rate and regular rhythm.   Pulmonary/Chest: Effort normal and breath sounds normal.  Neurological: Pt is alert. Not confused  Skin: Skin is warm, but has marked chafing/irriation ? Fungal to upper inner bilat prox thighs approx 6 cm area bilat, left > right; none to scrotum or genitals, no frank ulcerations; is swollen in some areas but nontender, no groin LA Psychiatric: Pt behavior is normal. Thought content normal.     Assessment & Plan:

## 2012-03-14 NOTE — Telephone Encounter (Signed)
Was reviewing results of echocardiogram. Indicates marked reduction in size of effusion relative to study performed on 25-SEP-13. Also noted patient had gone to a PCP who had suggested patient could take diflucan that the patient had at home for a rash. I called patient to advise AVOIDING concomitant warfarin + diflucan unless there was a documented fungal source of infection--and instead, to use the topical that he was prescribed so as to avoid the marked hypoprothrombinemic response that could ensue if warfarin + diflucan were taken together. We are in an an environment at present time where CVTS wants INR to remain as close to 2.0 as possible. Called patient at home to discuss. He had NOT taken any of the diflucan he had available to him.

## 2012-03-15 ENCOUNTER — Ambulatory Visit (INDEPENDENT_AMBULATORY_CARE_PROVIDER_SITE_OTHER): Payer: 59 | Admitting: Internal Medicine

## 2012-03-15 ENCOUNTER — Encounter: Payer: Self-pay | Admitting: Internal Medicine

## 2012-03-15 VITALS — BP 136/72 | HR 77 | Temp 97.4°F | Ht 76.0 in | Wt 227.0 lb

## 2012-03-15 DIAGNOSIS — R7309 Other abnormal glucose: Secondary | ICD-10-CM

## 2012-03-15 DIAGNOSIS — I7101 Dissection of thoracic aorta: Secondary | ICD-10-CM

## 2012-03-15 DIAGNOSIS — R739 Hyperglycemia, unspecified: Secondary | ICD-10-CM | POA: Insufficient documentation

## 2012-03-15 DIAGNOSIS — R21 Rash and other nonspecific skin eruption: Secondary | ICD-10-CM

## 2012-03-15 DIAGNOSIS — I824Y9 Acute embolism and thrombosis of unspecified deep veins of unspecified proximal lower extremity: Secondary | ICD-10-CM

## 2012-03-15 DIAGNOSIS — S143XXA Injury of brachial plexus, initial encounter: Secondary | ICD-10-CM | POA: Insufficient documentation

## 2012-03-15 NOTE — Telephone Encounter (Signed)
Noted - thanks - i will also notify the rx'ing MD to let him know of same

## 2012-03-15 NOTE — Assessment & Plan Note (Signed)
Op note and repair reviewed today. Patient appears to be recovering well Congratulations on smoking cessation provided Continue medical management and followup with cardiothoracic surgeon as planned Improved pericardial effusion on followup echo between September 25 and September 30, reviewed with patient today

## 2012-03-15 NOTE — Assessment & Plan Note (Signed)
Left lower leg DVT precipitated by recombinant factor VII given intraoperatively Cautiously, on warfarin, goal INR 2.0 Following with [facility] for anticoagulation management Duration of treatment anticipated 3 months given provoked occurrence Lab Results  Component Value Date   INR 2.0 03/13/2012   INR 1.90 03/10/2012   INR 2.3 03/09/2012

## 2012-03-15 NOTE — Progress Notes (Signed)
Subjective:    Patient ID: [name], male    DOB: Sep 20, 1950, 61 y.o.   MRN: [id number]  HPI  For hospital followup: emergent surgical repair of thoracic aortic aneurysm dissection September 8. Postop course complicated by encephalopathy and development of left lower leg DVT Following with [facility] > plan in 3 months warfarin (DVT felt provoked rFVIIa administration intraoperatively) Hyperglycemia noted during hospitalization -on OHA - declined Lantus at discharge. Patient declines glucometer and is not checking home CBGs. Denies symptoms of hypoglycemia  Concerned about abnormal appearance of right forearm and numbness of right hand (fourth and fifth fingers improved numbness in past 24 hours) -reports possible brachial plexus injury intraoperatively  Also reviewed other chronic issues:  sleep apnea - sleep study 9/09 with moderate sleep apnea reviewed. However, patient had problems with CPAP -   seasonal allergies - reasonable control with current meds -takes his nasal spray, and allergy pills as rx'd. Occasional vertigo will be associated when ears are stopped from sinus problems. No sneezing or current recent symptoms - hx allergy to yellow jackets and keeps supply of epi pen at home   dyslipidemia - prev on lovaza and lipitor tx for same, intol of simva solo- changed from vytroin tabs to crestor 02/2012 -the patient reports compliance with medication(s) as prescribed. Denies adverse side effects.  Past Medical History  Diagnosis Date  . OBSTRUCTIVE SLEEP APNEA 02/2008 sleep study  . MIGRAINE HEADACHE   . ERECTILE DYSFUNCTION, ORGANIC   . Unspecified vitamin D deficiency   . HYPERLIPIDEMIA   . ASTHMA   . ALLERGIC RHINITIS   . Dissecting aortic aneurysm, thoracic 02/20/2012    s/p emergent surgical repair  . Hyperglycemia   . Anxiety    Family History  Problem Relation Age of Onset  . Diabetes Mother   . Heart disease Maternal Uncle   . Heart disease Paternal  Uncle   . Heart disease Cousin   . Lung cancer Cousin     2 cousins   History  Substance Use Topics  . Smoking status: Former Smoker -- 0.1 packs/day  . Smokeless tobacco: Not on file   Comment: 10 cigs/day, quit 02/2012 after TAA emergent repair  . Alcohol Use: Yes     Rarely    Review of Systems Constitutional: Negative for fever or weight change.  Respiratory: Negative for cough and shortness of breath.   Cardiovascular: Negative for chest pain or palpitations.  Gastrointestinal: Negative for abdominal pain, no bowel changes.  Musculoskeletal: Negative for gait problem or joint swelling.  Skin: Negative for rash.  Neurological: Negative for dizziness or headache.  No other specific complaints in a complete review of systems (except as listed in HPI above).      Objective:   Physical Exam  BP 136/72  Pulse 77  Temp 97.4 F (36.3 C) (Oral)  Ht 6\' 4"  (1.93 m)  Wt 227 lb (102.967 kg)  BMI 27.63 kg/m2  SpO2 96% Wt Readings from Last 3 Encounters:  03/15/12 227 lb (102.967 kg)  03/14/12 228 lb 4 oz (103.534 kg)  03/06/12 231 lb 7.7 oz (105 kg)   Constitutional:  He appears well-developed and well-nourished. No distress.  Sister at side HENT: NCAT, TMs clear without erythema or effusion - OP clear Eyes: wears corr lenses - PERRL - no jaundice or icterus Neck: Normal range of motion. Neck supple. No JVD or LAD present. No thyromegaly present.  Cardiovascular: Normal rate, regular rhythm and normal heart sounds.  No murmur heard. no BLE edema Pulmonary/Chest: Effort normal and breath sounds normal. No respiratory distress. no wheezes.  Abdominal: Soft. Bowel sounds are normal. Patient exhibits no distension. There is no tenderness.  Musculoskeletal: Atrophy of proximal right forearm muscles, volar surface - but normal range of motion at right elbow right wrist and right fingers. Ligamentous function intact. Unable to assess shoulder and neck strength due to recent surgery    Neurological: Numbness median distribution of right hand but equal bilateral hand grip strength. he is alert and oriented to person, place, and time. No cranial nerve deficit. Coordination and gait are cautious but normal normal.  Skin: Healing sternotomy scar -incision clean dry and intact with Steri-Strips, no drainage or erythema - Candida rash changes bilateral groin, no hives, blister or ulceration. Remaining skin is warm and dry.  No confluent erythema or ulceration.  Psychiatric: he has a normal mood and affect. behavior is normal. Judgment and thought content normal.    Lab Results  Component Value Date   WBC 6.9 03/08/2012   HGB 8.4* 03/08/2012   HCT 26.0* 03/08/2012   PLT 223 03/08/2012   CHOL 119 03/14/2012   TRIG 76.0 03/14/2012   HDL 30.20* 03/14/2012   LDLDIRECT 114* 03/21/2010   ALT 55* 03/14/2012   AST 27 03/14/2012   NA 137 03/14/2012   K 4.3 03/14/2012   CL 102 03/14/2012   CREATININE 0.9 03/14/2012   BUN 15 03/14/2012   CO2 26 03/14/2012   TSH 0.95 02/16/2012   PSA 0.47 01/02/2010   INR 2.0 03/13/2012   HGBA1C 6.0 03/14/2012       Assessment & Plan:   see problem list. Medications and labs reviewed today.  Time spent with pt/family today 40 minutes, greater than 50% time spent counseling patient on hospitalization for thoracic aortic dissection and DVT, hyperglycemia, rash and medication review. Also review of hospital records

## 2012-03-15 NOTE — Assessment & Plan Note (Signed)
Atrophy of right forearm volar surface noted on exam today Suspect injury to nerves during axillary dissection for cardiopulmonary bypass support during intraoperative repair of aortic dissection Discussed possible need for EMG and nerve conduction study if symptoms not improved - Encouraged by improvement of paresthesia in past few days Patient to followup with cardiothoracic surgeon on same concerns -reassurance given today

## 2012-03-15 NOTE — Assessment & Plan Note (Signed)
A1c of 7.5 early September 2013 when admitted for aortic dissection Treated with Lantus and sliding scale insulin, also suspect exacerbation related to steroid use in hospital A1c much improved at 6.0 in 3 weeks' time Decrease glyburide dose now and encourage patient to monitor for symptoms of hypoglycemia Patient declines need for glucometer and will call if concerns or problems Lab Results  Component Value Date   HGBA1C 6.0 03/14/2012

## 2012-03-15 NOTE — Patient Instructions (Signed)
It was good to see you today. We have reviewed your hospital and rehab records including labs and tests today Continue to use the antifungal cream for your groin rash Decrease glyburide for sugars to 5mg  each AM Use Crestor and not Lipitor for your cholesterol Check with Vonna Kotyk about need for Protonix - ok to stop this if ok with Vonna Kotyk Please schedule followup in 3 months, call sooner if problems.

## 2012-03-15 NOTE — Assessment & Plan Note (Signed)
I agree the rash appears candidal/fungal Continue topical antifungal with steroid, patient to call if symptoms worse or unimproved

## 2012-03-16 NOTE — Addendum Note (Signed)
Addended by: Rene Paci A on: 03/16/2012 02:45 PM   Modules accepted: Orders

## 2012-03-17 ENCOUNTER — Ambulatory Visit (INDEPENDENT_AMBULATORY_CARE_PROVIDER_SITE_OTHER): Payer: 59 | Admitting: Pharmacist

## 2012-03-17 ENCOUNTER — Encounter: Payer: Self-pay | Admitting: Cardiovascular Disease

## 2012-03-17 ENCOUNTER — Ambulatory Visit (INDEPENDENT_AMBULATORY_CARE_PROVIDER_SITE_OTHER): Payer: 59 | Admitting: Cardiovascular Disease

## 2012-03-17 VITALS — BP 143/84 | HR 84 | Ht 76.0 in | Wt 227.0 lb

## 2012-03-17 DIAGNOSIS — I824Y9 Acute embolism and thrombosis of unspecified deep veins of unspecified proximal lower extremity: Secondary | ICD-10-CM

## 2012-03-17 DIAGNOSIS — I7101 Dissection of thoracic aorta: Secondary | ICD-10-CM

## 2012-03-17 DIAGNOSIS — I714 Abdominal aortic aneurysm, without rupture, unspecified: Secondary | ICD-10-CM

## 2012-03-17 DIAGNOSIS — I71019 Dissection of thoracic aorta, unspecified: Secondary | ICD-10-CM

## 2012-03-17 DIAGNOSIS — S143XXA Injury of brachial plexus, initial encounter: Secondary | ICD-10-CM

## 2012-03-17 DIAGNOSIS — Z7901 Long term (current) use of anticoagulants: Secondary | ICD-10-CM

## 2012-03-17 DIAGNOSIS — E785 Hyperlipidemia, unspecified: Secondary | ICD-10-CM

## 2012-03-17 DIAGNOSIS — Z952 Presence of prosthetic heart valve: Secondary | ICD-10-CM

## 2012-03-17 DIAGNOSIS — Z954 Presence of other heart-valve replacement: Secondary | ICD-10-CM

## 2012-03-17 LAB — CBC WITH DIFFERENTIAL/PLATELET
Basophils Absolute: 0.1 10*3/uL (ref 0.0–0.1)
Basophils Relative: 1 % (ref 0–1)
MCHC: 31.8 g/dL (ref 30.0–36.0)
Monocytes Absolute: 0.9 10*3/uL (ref 0.1–1.0)
Neutro Abs: 5.8 10*3/uL (ref 1.7–7.7)
Neutrophils Relative %: 69 % (ref 43–77)
RDW: 14.6 % (ref 11.5–15.5)

## 2012-03-17 LAB — POCT INR: INR: 2.8

## 2012-03-17 NOTE — Patient Instructions (Signed)
Patient instructed to take medications as defined in the Anti-coagulation Track section of this encounter.  Patient instructed to take today's dose.  Patient verbalized understanding of these instructions.    

## 2012-03-17 NOTE — Patient Instructions (Addendum)
Your physician recommends that you continue on your current medications as directed. Please refer to the Current Medication list given to you today.  Your physician has requested that you have an echocardiogram in 2 months same day as appointment with Dr. Eden Emms. Echocardiography is a painless test that uses sound waves to create images of your heart. It provides your doctor with information about the size and shape of your heart and how well your heart's chambers and valves are working. This procedure takes approximately one hour. There are no restrictions for this procedure.  Your physician has requested that you have a lower extremity venous duplex in 2 months same day as appointment with Dr. Eden Emms. This test is an ultrasound of the veins in the legs or arms. It looks at venous blood flow that carries blood from the heart to the legs or arms. Allow one hour for a Lower Venous exam. Allow thirty minutes for an Upper Venous exam. There are no restrictions or special instructions.  Your physician recommends that you schedule a follow-up appointment in: 2 months with DR. Nishan, Echo & doppler

## 2012-03-17 NOTE — Assessment & Plan Note (Signed)
Coumadin followed at [facility]  LE venous duplex in 8 weeks.

## 2012-03-17 NOTE — Progress Notes (Signed)
Patient ID: [name], male   DOB: Mar 21, 1951, 61 y.o.   MRN: [id number] 61 yo with emergent type A disection repair 9/7by PVT.  Post op course complicated by DVT on 3 month course of coumadin.  Had pericardial effusion and this is improved on recent echo reviewed from 9/30.  No signs of tamponade.  Previous smoker but has not restarted since surgery. Surgery 02/19/12  Status post repair of type a ascending dissection with Heaney shield graft used to replace ascending aorta and hemi-arch reconstruction, resuspension of aortic valve  Has a residual 6cm infrarenal AAA  Since surgery has had paresthesias in 4th and 5th digits on right hand  Likely brachial plexus injury with axillary artery cannulation on right side.  He had a multitude of questions  Given his residual AAA and DVT with coumadin and the fact that insurance does not cover cardiac rehab I am not sure he needs to go.  Can walk and be active on his own.  No cardiac history and survived emergent disection surgery so I dont think stress test needed  There is a family history of aneurysms and with presentation of AAA and thoracic disection I suggested that he should discuss with PVT/Leschber screening his cerebral circulation.    Discussed the resolution of his pericardial effusion and ongoing need to monitor his resuspended AV  Will F/U with Dr Maren Beach and needs timing and game plan for AAA.  Given disection I am not sure he would be a candidate for stent grafting but this needs to be looked into  Would continue aggressive risk factor modification with statin.    Needs F/U venous duplex in about two months when coumadin planned on stopping      PROCEDURE: Procedure(s) (LRB) with comments:  THORACIC ASCENDING ANEURYSM REPAIR (AAA) (N/A)  Grafting of ascending aorta and hemi-arch replacement under circulatory arrest, antegrade cerebral perfusion via right axillary artery cannulation, using 30 mm heme shield Dacron graft  SURGEON:  Surgeon(s) and Role:  * Kerin Perna, MD - Primary  ROS: Denies fever, malais, weight loss, blurry vision, decreased visual acuity, cough, sputum, SOB, hemoptysis, pleuritic pain, palpitaitons, heartburn, abdominal pain, melena, lower extremity edema, claudication, or rash.  All other systems reviewed and negative   General: Affect appropriate Older looking than stated age HEENT: normal Neck supple with no adenopathy JVP normal no bruits no thyromegaly Lungs clear with no wheezing and good diaphragmatic motion Heart:  S1/S2 SEM murmur,rub, gallop or click PMI normal Abdomen: benighn, BS positve, no tenderness, no AAA no bruit.  No HSM or HJR Distal pulses intact with no bruits No edema Neuro non-focal Skin warm and dry No muscular weakness Infraclavicular incision on right healted well as has sternotomy  Medications Current Outpatient Prescriptions  Medication Sig Dispense Refill  . aspirin 81 MG chewable tablet Chew 1 tablet (81 mg total) by mouth daily.      . cholecalciferol (VITAMIN D) 1000 UNITS tablet Take 2,000 Units by mouth daily.      . clotrimazole-betamethasone (LOTRISONE) cream Use as directed twice per day as needed  15 g  1  . EPINEPHrine (EPI-PEN) 0.3 mg/0.3 mL DEVI Inject 0.3 mg into the muscle as needed. For anaphylaxis       . ferrous fumarate-b12-vitamic C-folic acid (TRINSICON / FOLTRIN) capsule Take 1 capsule by mouth 2 (two) times daily after a meal.  60 capsule  1  . fexofenadine (ALLEGRA) 180 MG tablet Take 180 mg by mouth daily.        [name]  fluticasone (FLONASE) 50 MCG/ACT nasal spray Place 2 sprays into the nose daily.      [name] glyBURIDE (DIABETA) 5 MG tablet Take 1 tablet (5 mg total) by mouth daily with breakfast.  30 tablet  1  . HYDROcodone-acetaminophen (NORCO/VICODIN) 5-325 MG per tablet Take 1 tablet by mouth every 4 (four) hours as needed.  30 tablet  0  . metoprolol tartrate (LOPRESSOR) 25 MG tablet Take 1 tablet (25 mg total) by mouth 2 (two)  times daily.      . montelukast (SINGULAIR) 10 MG tablet Take 10 mg by mouth daily.      [name] omega-3 acid ethyl esters (LOVAZA) 1 G capsule Take 1 g by mouth 2 (two) times daily.      . pantoprazole (PROTONIX) 40 MG tablet Take 1 tablet (40 mg total) by mouth daily at 12 noon.  30 tablet  1  . predniSONE (DELTASONE) 5 MG tablet Take 5 mg by mouth daily as needed. For asthma flare up      . senna (SENOKOT) 8.6 MG TABS Take 1 tablet (8.6 mg total) by mouth 2 (two) times daily.  120 each    . tretinoin (RETIN-A) 0.1 % cream Apply 1 application topically daily as needed. For acne or rash      . warfarin (COUMADIN) 5 MG tablet Take 1 tablet (5 mg total) by mouth at bedtime.  30 tablet  1  . rosuvastatin (CRESTOR) 10 MG tablet Take 10 mg by mouth daily.        Allergies Testosterone  Family History: Family History  Problem Relation Age of Onset  . Diabetes Mother   . Heart disease Maternal Uncle   . Heart disease Paternal Uncle   . Heart disease Cousin   . Lung cancer Cousin     2 cousins    Social History: History   Social History  . Marital Status: Legally Separated    Spouse Name: N/A    Number of Children: N/A  . Years of Education: N/A   Occupational History  . Not on file.   Social History Main Topics  . Smoking status: Former Smoker -- 0.1 packs/day  . Smokeless tobacco: Not on file   Comment: 10 cigs/day, quit 02/2012 after TAA emergent repair  . Alcohol Use: Yes     Rarely  . Drug Use: Not on file  . Sexually Active: Not on file   Other Topics Concern  . Not on file   Social History Narrative  . No narrative on file    Electrocardiogram:  9/20  NSR rate 75 nonspecific St/T wave changes  Assessment and Plan

## 2012-03-17 NOTE — Assessment & Plan Note (Signed)
Seems to have done well.  Need to repeat echo in 8 weeks Persistant SEM likely from repaired vlave and new root graft but no AR on last echo.  SBE prophylaxis

## 2012-03-17 NOTE — Assessment & Plan Note (Signed)
Cholesterol is at goal.  Continue current dose of statin and diet Rx.  No myalgias or side effects.  F/U  LFT's in 6 months. Lab Results  Component Value Date   LDLCALC 74 03/14/2012             

## 2012-03-17 NOTE — Assessment & Plan Note (Signed)
Not palpable on exam  F/U PVT He will likely arrange visti with his [facility] partners to decide on timing of surgery and if stenting grafting is appropriate.  Will defer to Dr Felicity Coyer and PVT to screen cerebral circulation.  Would need to finish 3 month of coumadin Rx before AAA Repair.  Clear from cardiac perspective to proceed with this

## 2012-03-17 NOTE — Assessment & Plan Note (Signed)
Likely mild permanent paresthesias  Considering life threatening surgery he has done well  Can consider referral to neuro

## 2012-03-17 NOTE — Progress Notes (Signed)
Called Dr. Con Memos office and notified them that the CBC Diff results are available for her review. Office verified that they can see the results in EPIC.  Alric Quan, [facility] 03-17-12 [phone]

## 2012-03-17 NOTE — Progress Notes (Signed)
Anti-Coagulation Progress Note  [name] is a 61 y.o. male who is currently on an anti-coagulation regimen.    RECENT RESULTS: Recent results are below, the most recent result is correlated with a dose of 6.25 mg AVERAGE DOSE since seen this past Monday (then INR = 2.0). Have reviewed results of echo and pericardial effusion was noted to have markedly decreased in size from the initial study. This was performed after the visit on Monday of this week. The potential for that effusion--"growing"/enlarging--had been the driver of the targeted INR of 2.0 exactly. As such, and since he would have been at some INR value between 2.0 on Monday 30-SEP-13 to TODAY 4-OCT-13 (INR = 2.8) he has not been >2.0 for a long period of time. I will DECREASE his daily dose to 5mg  warfarin PO qd and re-evaluate his INR response on Monday. I have had a CBC drawn as per the request of Dr. Felicity Coyer who saw the patient in office yesterday. We still must balance the concern for exacerbation of the pericardial effusion (now shown to be markedly reduced in size) with the fact that the patient is in the midst of an acute episode of deep vein thrombosis. Will work hard to balance both of these issues. May begin to attempt to target an INR RANGE of 2.0 - 2.5 in an effort to be sensitive to both concerns. Thank you for the opportunity to be a part of this patient's care.  Lab Results  Component Value Date   INR 2.80 03/17/2012   INR 2.0 03/13/2012   INR 1.90 03/10/2012    ANTI-COAG DOSE:   Latest dosing instructions   Total Sun Mon Tue Wed Thu Fri Sat   40 5 mg 7.5 mg 5 mg 5 mg 7.5 mg 5 mg 5 mg    (5 mg1) (5 mg1.5) (5 mg1) (5 mg1) (5 mg1.5) (5 mg1) (5 mg1)         ANTICOAG SUMMARY: Anticoagulation Episode Summary              Current INR goal 2.0 Next INR check 03/20/2012   INR from last check 2.80! (03/17/2012)     Weekly max dose (mg)  Target end date    Indications Encounter for long-term (current) use of  anticoagulants, Acute venous embolism and thrombosis of deep vessels of proximal lower extremity   IN[facility] check location [facility] Preferred lab    Send INR reminders to    Comments Discharged from hospital 25-SEP-13 after acute admission for emergency surgery early September for triple A repair of class A aneurysm by Dr. Morton Peters. Intra-operatively, PLCT fell to 40,000. Patient required administration of rFVIIa. Subsequently despite efforts of mechanical prophylaxis against VTE postoperatively, patient did develop lower extremity DVT of left leg. I would consider this a "known provoked" DVT (secondary to rFVIIa). Given this, until suggested otherwise, will target 57mo Tx.            ANTICOAG TODAY: Anticoagulation Summary as of 03/17/2012              INR goal 2.0     Selected INR 2.80! (03/17/2012) Next INR check 03/20/2012   Weekly max dose (mg)  Target end date    Indications Encounter for long-term (current) use of anticoagulants, Acute venous embolism and thrombosis of deep vessels of proximal lower extremity    Anticoagulation Episode Summary              [facility]  Preferred lab    Send INR reminders to    Comments Discharged from hospital 25-SEP-13 after acute admission for emergency surgery early September for triple A repair of class A aneurysm by Dr. Morton Peters. Intra-operatively, PLCT fell to 40,000. Patient required administration of rFVIIa. Subsequently despite efforts of mechanical prophylaxis against VTE postoperatively, patient did develop lower extremity DVT of left leg. I would consider this a "known provoked" DVT (secondary to rFVIIa). Given this, until suggested otherwise, will target 26mo Tx.            PATIENT INSTRUCTIONS: Patient Instructions  Patient instructed to take medications as defined in the Anti-coagulation Track section of this encounter.  Patient instructed to take today's dose.  Patient verbalized understanding of these  instructions.        FOLLOW-UP Return in 3 days (on 03/20/2012) for Follow up INR at 1030h.  Hulen Luster, III Pharm.D., CACP

## 2012-03-20 ENCOUNTER — Ambulatory Visit (INDEPENDENT_AMBULATORY_CARE_PROVIDER_SITE_OTHER): Payer: 59 | Admitting: Pharmacist

## 2012-03-20 ENCOUNTER — Other Ambulatory Visit: Payer: Self-pay | Admitting: Cardiothoracic Surgery

## 2012-03-20 DIAGNOSIS — I824Y9 Acute embolism and thrombosis of unspecified deep veins of unspecified proximal lower extremity: Secondary | ICD-10-CM

## 2012-03-20 DIAGNOSIS — Z7901 Long term (current) use of anticoagulants: Secondary | ICD-10-CM

## 2012-03-20 DIAGNOSIS — I714 Abdominal aortic aneurysm, without rupture: Secondary | ICD-10-CM

## 2012-03-20 NOTE — Progress Notes (Signed)
Anti-Coagulation Progress Note  [name] is a 61 y.o. male who is currently on an anti-coagulation regimen.    RECENT RESULTS: Recent results are below, the most recent result is correlated with a dose of 40 mg. per week: Lab Results  Component Value Date   INR 3.2 03/20/2012   INR 2.80 03/17/2012   INR 2.0 03/13/2012    ANTI-COAG DOSE:   Latest dosing instructions   Total Sun Mon Tue Wed Thu Fri Sat   30 5 mg 2.5 mg 5 mg 5 mg 2.5 mg 5 mg 5 mg    (5 mg1) (5 mg0.5) (5 mg1) (5 mg1) (5 mg0.5) (5 mg1) (5 mg1)         ANTICOAG SUMMARY: Anticoagulation Episode Summary              Current INR goal 2.0 Next INR check 03/27/2012   INR from last check 3.2! (03/20/2012)     Weekly max dose (mg)  Target end date    Indications Long term (current) use of anticoagulants, Acute venous embolism and thrombosis of deep vessels of proximal lower extremity   [facility] check location [facility] Preferred lab    Send INR reminders to    Comments Discharged from hospital 25-SEP-13 after acute admission for emergency surgery early September for triple A repair of class A aneurysm by Dr. Morton Peters. Intra-operatively, PLCT fell to 40,000. Patient required administration of rFVIIa. Subsequently despite efforts of mechanical prophylaxis against VTE postoperatively, patient did develop lower extremity DVT of left leg. I would consider this a "known provoked" DVT (secondary to rFVIIa). Given this, until suggested otherwise, will target 344mo Tx.            ANTICOAG TODAY: Anticoagulation Summary as of 03/20/2012              INR goal 2.0     Selected INR 3.2! (03/20/2012) Next INR check 03/27/2012   Weekly max dose (mg)  Target end date    Indications Long term (current) use of anticoagulants, Acute venous embolism and thrombosis of deep vessels of proximal lower extremity    Anticoagulation Episode Summary              IN[facility] check location [facility] Preferred lab    Send INR reminders  to    Comments Discharged from hospital 25-SEP-13 after acute admission for emergency surgery early September for triple A repair of class A aneurysm by Dr. Morton Peters. Intra-operatively, PLCT fell to 40,000. Patient required administration of rFVIIa. Subsequently despite efforts of mechanical prophylaxis against VTE postoperatively, patient did develop lower extremity DVT of left leg. I would consider this a "known provoked" DVT (secondary to rFVIIa). Given this, until suggested otherwise, will target 344mo Tx.            PATIENT INSTRUCTIONS: Patient Instructions  Patient instructed to take medications as defined in the Anti-coagulation Track section of this encounter.  Patient instructed to take today's dose.  Patient verbalized understanding of these instructions.        FOLLOW-UP Return in 7 days (on 03/27/2012) for Follow up INR at 1045h.  Hulen Luster, III Pharm.D., CACP

## 2012-03-20 NOTE — Progress Notes (Signed)
Agree with plan 

## 2012-03-20 NOTE — Patient Instructions (Signed)
Patient instructed to take medications as defined in the Anti-coagulation Track section of this encounter.  Patient instructed to take today's dose.  Patient verbalized understanding of these instructions.    

## 2012-03-21 ENCOUNTER — Other Ambulatory Visit: Payer: Self-pay | Admitting: Internal Medicine

## 2012-03-21 ENCOUNTER — Ambulatory Visit: Payer: 59 | Attending: Physical Medicine & Rehabilitation | Admitting: Speech Pathology

## 2012-03-21 DIAGNOSIS — IMO0001 Reserved for inherently not codable concepts without codable children: Secondary | ICD-10-CM | POA: Insufficient documentation

## 2012-03-21 DIAGNOSIS — R4701 Aphasia: Secondary | ICD-10-CM | POA: Insufficient documentation

## 2012-03-22 ENCOUNTER — Encounter: Payer: Self-pay | Admitting: Cardiothoracic Surgery

## 2012-03-22 ENCOUNTER — Ambulatory Visit (INDEPENDENT_AMBULATORY_CARE_PROVIDER_SITE_OTHER): Payer: Self-pay | Admitting: Cardiothoracic Surgery

## 2012-03-22 ENCOUNTER — Other Ambulatory Visit: Payer: Self-pay | Admitting: Cardiothoracic Surgery

## 2012-03-22 ENCOUNTER — Ambulatory Visit
Admission: RE | Admit: 2012-03-22 | Discharge: 2012-03-22 | Disposition: A | Discharge status: Home / Self Care | Payer: 59 | Source: Ambulatory Visit | Attending: Cardiothoracic Surgery | Admitting: Cardiothoracic Surgery

## 2012-03-22 VITALS — BP 148/86 | HR 89 | Resp 16 | Ht 76.0 in | Wt 228.0 lb

## 2012-03-22 DIAGNOSIS — I71019 Dissection of thoracic aorta, unspecified: Secondary | ICD-10-CM

## 2012-03-22 DIAGNOSIS — I313 Pericardial effusion (noninflammatory): Secondary | ICD-10-CM

## 2012-03-22 DIAGNOSIS — Z09 Encounter for follow-up examination after completed treatment for conditions other than malignant neoplasm: Secondary | ICD-10-CM

## 2012-03-22 DIAGNOSIS — I714 Abdominal aortic aneurysm, without rupture: Secondary | ICD-10-CM

## 2012-03-22 DIAGNOSIS — I319 Disease of pericardium, unspecified: Secondary | ICD-10-CM

## 2012-03-22 DIAGNOSIS — I10 Essential (primary) hypertension: Secondary | ICD-10-CM

## 2012-03-22 DIAGNOSIS — I7101 Dissection of thoracic aorta: Secondary | ICD-10-CM

## 2012-03-22 NOTE — Progress Notes (Signed)
PCP is Rene Paci, MD Referring Provider is Glynn Octave, MD  Chief Complaint  Patient presents with  . Routine Post Op    2 week f/u after d/c from rehab s/p emergency repair of thoracic dissection 02/20/12...with cxr    HPI: The patient returns for a one month followup after emergency repair of a type A  ascending thoracic dissection with Dacron straight graft replacement of the ascending aorta and hemi-arch reconstruction with resuspension of the aortic valve He developed DVT in the left leg. And is on Coumadin managed by Dr. of pharmacy [facility]. Plan is for 3 months of Coumadin. The patient had a pericardial effusion postop which was significantly improved on his last 2-D echo one week ago. His chest x-ray today shows a decrease in size of the cardiac silhouette. The patient is walking so 100 feet daily. Incisions are well-healed. He has no anginal pain or symptoms of CHF. He has had one headache with some transient visual changes for which she will be assessed with a carotid ultrasound. He takes aspirin 81 mg with his Coumadin. INR goal is 2.0-2.5 with last INR 3.2 which resulted in a reduction in dose of Coumadin.  The patient was also diagnosed with a 5-6 cm infrarenal saccular abdominal aortic aneurysm followed by Dr. Betti Cruz. This is asymptomatic with surgical repair planned after 3 months of recovery after his cardiac surgery and when he finishes course of Coumadin.   Past Medical History  Diagnosis Date  . OBSTRUCTIVE SLEEP APNEA 02/2008 sleep study  . MIGRAINE HEADACHE   . ERECTILE DYSFUNCTION, ORGANIC   . Unspecified vitamin D deficiency   . HYPERLIPIDEMIA   . ASTHMA   . ALLERGIC RHINITIS   . Dissecting aortic aneurysm, thoracic 02/20/2012    s/p emergent surgical repair  . Hyperglycemia   . Anxiety     Past Surgical History  Procedure Date  . Nasal fx without repair   . Thoracic aortic aneurysm repair 02/20/2012    Procedure: THORACIC ASCENDING ANEURYSM REPAIR  (AAA);  Surgeon: Kerin Perna, MD;  Location: [facility] OR;  Service: Open Heart Surgery;  Laterality: N/A;    Family History  Problem Relation Age of Onset  . Diabetes Mother   . Heart disease Maternal Uncle   . Heart disease Paternal Uncle   . Heart disease Cousin   . Lung cancer Cousin     2 cousins    Social History History  Substance Use Topics  . Smoking status: Former Smoker -- 0.1 packs/day  . Smokeless tobacco: Not on file   Comment: 10 cigs/day, quit 02/2012 after TAA emergent repair  . Alcohol Use: Yes     Rarely    Current Outpatient Prescriptions  Medication Sig Dispense Refill  . aspirin 81 MG chewable tablet Chew 1 tablet (81 mg total) by mouth daily.      . cholecalciferol (VITAMIN D) 1000 UNITS tablet Take 2,000 Units by mouth daily.      . clotrimazole-betamethasone (LOTRISONE) cream Use as directed twice per day as needed  15 g  1  . EPINEPHrine (EPI-PEN) 0.3 mg/0.3 mL DEVI Inject 0.3 mg into the muscle as needed. For anaphylaxis       . ferrous fumarate-b12-vitamic C-folic acid (TRINSICON / FOLTRIN) capsule Take 1 capsule by mouth 2 (two) times daily after a meal.  60 capsule  1  . fexofenadine (ALLEGRA) 180 MG tablet Take 180 mg by mouth daily.        . fluticasone (FLONASE)  50 MCG/ACT nasal spray Place 2 sprays into the nose daily.      [name] glyBURIDE (DIABETA) 5 MG tablet Take 1 tablet (5 mg total) by mouth daily with breakfast.  30 tablet  1  . HYDROcodone-acetaminophen (NORCO/VICODIN) 5-325 MG per tablet Take 1 tablet by mouth every 4 (four) hours as needed.  30 tablet  0  . metoprolol tartrate (LOPRESSOR) 25 MG tablet Take 1 tablet (25 mg total) by mouth 2 (two) times daily.      . montelukast (SINGULAIR) 10 MG tablet TAKE 1 TABLET BY MOUTH ONCE DAILY  90 tablet  2  . omega-3 acid ethyl esters (LOVAZA) 1 G capsule Take 1 g by mouth 2 (two) times daily.      . pantoprazole (PROTONIX) 40 MG tablet Take 1 tablet (40 mg total) by mouth daily at 12 noon.  30 tablet   1  . polyethylene glycol (MIRALAX / GLYCOLAX) packet Take 17 g by mouth daily.      . predniSONE (DELTASONE) 5 MG tablet Take 5 mg by mouth daily as needed. For asthma flare up      . rosuvastatin (CRESTOR) 10 MG tablet Take 10 mg by mouth daily.      [name] tretinoin (RETIN-A) 0.1 % cream Apply 1 application topically daily as needed. For acne or rash      . warfarin (COUMADIN) 5 MG tablet Take 1 tablet (5 mg total) by mouth at bedtime.  30 tablet  1    Allergies  Allergen Reactions  . Testosterone Shortness Of Breath    Medication:Androgel Pt states trouble breathing at night    Review of Systems  BP 148/86  Pulse 89  Resp 16  Ht 6\' 4"  (1.93 m)  Wt 228 lb (103.42 kg)  BMI 27.75 kg/m2  SpO2 97% Physical Exam General alert and comfortable HEENT normocephalic pupils equal good carotid pulses Thorax well-healed incisions clear breath sounds no AI murmur no S3 gallop sinus rhythm Abdomen soft nontender Extremities warm positive pulses no significant edema Neuro intact  Diagnostic Tests: Chest x-ray today shows slight decrease in size of cardiac silhouette, no pleural effusion, no pulmonary infiltrates sternal wires are intact  Impression: Stable course one month after emergency repair of type A dissection. Some residual weakness in his right ME probably from brachial plexus stretch from axillary  artery exposure and cannulation.  Plan:Pacemaker his activities include driving, part-time work, lifting up to 15 pounds, and a 20 minute walk at least 5 days a week. His blood pressure is elevated we'll be started on lisinopril 10 mg daily. He has a panic attack distally to take some Xanax in the previous he has at home. I'll see him back in the office in 3 weeks with chest x-ray to review his carotid ultrasound and to check his blood pressure. He will be deferred to Dr. Betti Cruz for office evaluation next month.

## 2012-03-27 ENCOUNTER — Ambulatory Visit (INDEPENDENT_AMBULATORY_CARE_PROVIDER_SITE_OTHER): Payer: 59 | Admitting: Pharmacist

## 2012-03-27 DIAGNOSIS — Z7901 Long term (current) use of anticoagulants: Secondary | ICD-10-CM

## 2012-03-27 DIAGNOSIS — I824Y9 Acute embolism and thrombosis of unspecified deep veins of unspecified proximal lower extremity: Secondary | ICD-10-CM

## 2012-03-27 LAB — POCT INR: INR: 2.3

## 2012-03-27 NOTE — Patient Instructions (Signed)
Patient instructed to take medications as defined in the Anti-coagulation Track section of this encounter.  Patient instructed to take today's dose.  Patient verbalized understanding of these instructions.    

## 2012-03-27 NOTE — Progress Notes (Signed)
Agree with Dr. Groce's Assessment/plan for this patient.  

## 2012-03-27 NOTE — Progress Notes (Signed)
Anti-Coagulation Progress Note  [name] is a 61 y.o. male who is currently on an anti-coagulation regimen.    RECENT RESULTS: Recent results are below, the most recent result is correlated with a dose of 30 mg. per week: Lab Results  Component Value Date   INR 2.30 03/27/2012   INR 3.2 03/20/2012   INR 2.80 03/17/2012    ANTI-COAG DOSE:   Latest dosing instructions   Total Sun Mon Tue Wed Thu Fri Sat   30 5 mg 2.5 mg 5 mg 5 mg 2.5 mg 5 mg 5 mg    (5 mg1) (5 mg0.5) (5 mg1) (5 mg1) (5 mg0.5) (5 mg1) (5 mg1)         ANTICOAG SUMMARY: Anticoagulation Episode Summary              Current INR goal 2.0 Next INR check 04/10/2012   INR from last check 2.30! (03/27/2012)     Weekly max dose (mg)  Target end date    Indications Long term (current) use of anticoagulants, Acute venous embolism and thrombosis of deep vessels of proximal lower extremity   [facility] check location [facility] Preferred lab    Send INR reminders to    Comments Discharged from hospital 25-SEP-13 after acute admission for emergency surgery early September for triple A repair of class A aneurysm by Dr. Morton Peters. Intra-operatively, PLCT fell to 40,000. Patient required administration of rFVIIa. Subsequently despite efforts of mechanical prophylaxis against VTE postoperatively, patient did develop lower extremity DVT of left leg. I would consider this a "known provoked" DVT (secondary to rFVIIa). Given this, until suggested otherwise, will target 60mo Tx.            ANTICOAG TODAY: Anticoagulation Summary as of 03/27/2012              INR goal 2.0     Selected INR 2.30! (03/27/2012) Next INR check 04/10/2012   Weekly max dose (mg)  Target end date    Indications Long term (current) use of anticoagulants, Acute venous embolism and thrombosis of deep vessels of proximal lower extremity    Anticoagulation Episode Summary              INR check location [facility] Preferred lab    Send INR  reminders to    Comments Discharged from hospital 25-SEP-13 after acute admission for emergency surgery early September for triple A repair of class A aneurysm by Dr. Morton Peters. Intra-operatively, PLCT fell to 40,000. Patient required administration of rFVIIa. Subsequently despite efforts of mechanical prophylaxis against VTE postoperatively, patient did develop lower extremity DVT of left leg. I would consider this a "known provoked" DVT (secondary to rFVIIa). Given this, until suggested otherwise, will target 60mo Tx.            PATIENT INSTRUCTIONS: Patient Instructions  Patient instructed to take medications as defined in the Anti-coagulation Track section of this encounter.  Patient instructed to take today's dose.  Patient verbalized understanding of these instructions.        FOLLOW-UP Return in 2 weeks (on 04/10/2012) for Follow up INR at 1045h.  Hulen Luster, III Pharm.D., CACP

## 2012-04-02 ENCOUNTER — Encounter (HOSPITAL_COMMUNITY): Payer: Self-pay | Admitting: Emergency Medicine

## 2012-04-02 ENCOUNTER — Emergency Department (HOSPITAL_COMMUNITY)
Admission: EM | Admit: 2012-04-02 | Discharge: 2012-04-02 | Disposition: A | Discharge status: Home / Self Care | Payer: 59 | Attending: Emergency Medicine | Admitting: Emergency Medicine

## 2012-04-02 ENCOUNTER — Emergency Department (HOSPITAL_COMMUNITY): Payer: 59

## 2012-04-02 DIAGNOSIS — Z79899 Other long term (current) drug therapy: Secondary | ICD-10-CM | POA: Insufficient documentation

## 2012-04-02 DIAGNOSIS — Z7982 Long term (current) use of aspirin: Secondary | ICD-10-CM | POA: Insufficient documentation

## 2012-04-02 DIAGNOSIS — R42 Dizziness and giddiness: Secondary | ICD-10-CM | POA: Insufficient documentation

## 2012-04-02 DIAGNOSIS — R079 Chest pain, unspecified: Secondary | ICD-10-CM | POA: Insufficient documentation

## 2012-04-02 DIAGNOSIS — R531 Weakness: Secondary | ICD-10-CM

## 2012-04-02 DIAGNOSIS — R5383 Other fatigue: Secondary | ICD-10-CM | POA: Insufficient documentation

## 2012-04-02 DIAGNOSIS — Z7901 Long term (current) use of anticoagulants: Secondary | ICD-10-CM | POA: Insufficient documentation

## 2012-04-02 DIAGNOSIS — J45909 Unspecified asthma, uncomplicated: Secondary | ICD-10-CM | POA: Insufficient documentation

## 2012-04-02 DIAGNOSIS — R5381 Other malaise: Secondary | ICD-10-CM | POA: Insufficient documentation

## 2012-04-02 LAB — CBC WITH DIFFERENTIAL/PLATELET
Eosinophils Relative: 3 % (ref 0–5)
Lymphocytes Relative: 30 % (ref 12–46)
Lymphs Abs: 2.5 10*3/uL (ref 0.7–4.0)
MCV: 88.7 fL (ref 78.0–100.0)
Neutro Abs: 4.7 10*3/uL (ref 1.7–7.7)
Neutrophils Relative %: 56 % (ref 43–77)
Platelets: 245 10*3/uL (ref 150–400)
RBC: 4.24 MIL/uL (ref 4.22–5.81)
WBC: 8.4 10*3/uL (ref 4.0–10.5)

## 2012-04-02 LAB — POCT I-STAT, CHEM 8
Calcium, Ion: 1.16 mmol/L (ref 1.12–1.23)
Chloride: 106 mEq/L (ref 96–112)
HCT: 38 % — ABNORMAL LOW (ref 39.0–52.0)
Potassium: 4.1 mEq/L (ref 3.5–5.1)

## 2012-04-02 LAB — TROPONIN I: Troponin I: <0.3 ng/mL (ref <0.30)

## 2012-04-02 LAB — BASIC METABOLIC PANEL
CO2: 23 mEq/L (ref 19–32)
Calcium: 9.3 mg/dL (ref 8.4–10.5)
Sodium: 137 mEq/L (ref 135–145)

## 2012-04-02 LAB — PROTIME-INR
INR: 1.9 — ABNORMAL HIGH (ref 0.00–1.49)
Prothrombin Time: 21.1 seconds — ABNORMAL HIGH (ref 11.6–15.2)

## 2012-04-02 MED ORDER — SODIUM CHLORIDE 0.9 % IV SOLN: INTRAVENOUS | Status: DC

## 2012-04-02 MED ORDER — SODIUM CHLORIDE 0.9 % IV SOLN
INTRAVENOUS | Status: DC
  Administered 2012-04-02: 17:00:00 via INTRAVENOUS

## 2012-04-02 NOTE — ED Provider Notes (Addendum)
History     CSN: [id number]  Arrival date & time 04/02/12  1547   First MD Initiated Contact with Patient 04/02/12 1611      Chief Complaint  Patient presents with  . Dizziness    (Consider location/radiation/quality/duration/timing/severity/associated sxs/prior treatment) The history is provided by the patient.  Pt had emergent repair of Ao dissection on 02/20/12.  He was at work today.  Became lightheaded and saw bright lights.  Became weak.  Has cp that is unchanged since surgery.    Was seen by MD in office nearby and sent here.  Called code stroke.  Seen by dr. Roseanne Reno.  No evidence of stroke.   Code stroke cancelled.  Pt denies recent illness.  No change in meds.  Wants to go home and get rest.     Past Medical History  Diagnosis Date  . OBSTRUCTIVE SLEEP APNEA 02/2008 sleep study  . MIGRAINE HEADACHE   . ERECTILE DYSFUNCTION, ORGANIC   . Unspecified vitamin D deficiency   . HYPERLIPIDEMIA   . ASTHMA   . ALLERGIC RHINITIS   . Dissecting aortic aneurysm, thoracic 02/20/2012    s/p emergent surgical repair  . Hyperglycemia   . Anxiety     Past Surgical History  Procedure Date  . Nasal fx without repair   . Thoracic aortic aneurysm repair 02/20/2012    Procedure: THORACIC ASCENDING ANEURYSM REPAIR (AAA);  Surgeon: Kerin Perna, MD;  Location: [facility] OR;  Service: Open Heart Surgery;  Laterality: N/A;    Family History  Problem Relation Age of Onset  . Diabetes Mother   . Heart disease Maternal Uncle   . Heart disease Paternal Uncle   . Heart disease Cousin   . Lung cancer Cousin     2 cousins    History  Substance Use Topics  . Smoking status: Former Smoker -- 0.1 packs/day  . Smokeless tobacco: Not on file   Comment: 10 cigs/day, quit 02/2012 after TAA emergent repair  . Alcohol Use: Yes     Rarely      Review of Systems  Constitutional: Negative for fever and chills.  Eyes: Positive for visual disturbance.  Respiratory: Negative for cough and shortness of  breath.   Cardiovascular: Positive for chest pain.       Cp is soreness which is not changed from previously  Gastrointestinal: Negative for nausea, vomiting and abdominal pain.  Neurological: Positive for light-headedness. Negative for headaches.  Psychiatric/Behavioral: Negative for confusion.  All other systems reviewed and are negative.    Allergies  Testosterone  Home Medications   Current Outpatient Rx  Name Route Sig Dispense Refill  . ASPIRIN 81 MG PO CHEW Oral Chew 1 tablet (81 mg total) by mouth daily.    [name] VITAMIN D 1000 UNITS PO TABS Oral Take 2,000 Units by mouth daily.    [name] CLOTRIMAZOLE-BETAMETHASONE 1-0.05 % EX CREA  Use as directed twice per day as needed 15 g 1  . EPINEPHRINE 0.3 MG/0.3ML IJ DEVI Intramuscular Inject 0.3 mg into the muscle as needed. For anaphylaxis     . FE FUMARATE-B12-VIT C-FA-IFC PO CAPS Oral Take 1 capsule by mouth 2 (two) times daily after a meal. 60 capsule 1  . FEXOFENADINE HCL 180 MG PO TABS Oral Take 180 mg by mouth daily.      [name] FLUTICASONE PROPIONATE 50 MCG/ACT NA SUSP Nasal Place 2 sprays into the nose daily.    . GLYBURIDE 5 MG PO TABS Oral Take 1 tablet (  5 mg total) by mouth daily with breakfast. 30 tablet 1  . HYDROCODONE-ACETAMINOPHEN 5-325 MG PO TABS Oral Take 1 tablet by mouth every 4 (four) hours as needed. 30 tablet 0  . METOPROLOL TARTRATE 25 MG PO TABS Oral Take 1 tablet (25 mg total) by mouth 2 (two) times daily.    [name] MONTELUKAST SODIUM 10 MG PO TABS  TAKE 1 TABLET BY MOUTH ONCE DAILY 90 tablet 2  . OMEGA-3-ACID ETHYL ESTERS 1 G PO CAPS Oral Take 1 g by mouth 2 (two) times daily.    [name] PANTOPRAZOLE SODIUM 40 MG PO TBEC Oral Take 1 tablet (40 mg total) by mouth daily at 12 noon. 30 tablet 1  . POLYETHYLENE GLYCOL 3350 PO PACK Oral Take 17 g by mouth daily.    [name] PREDNISONE 5 MG PO TABS Oral Take 5 mg by mouth daily as needed. For asthma flare up    . ROSUVASTATIN CALCIUM 10 MG PO TABS Oral Take 10 mg by mouth daily.    .  TRETINOIN 0.1 % EX CREA Topical Apply 1 application topically daily as needed. For acne or rash    . WARFARIN SODIUM 5 MG PO TABS Oral Take 1 tablet (5 mg total) by mouth at bedtime. 30 tablet 1    BP 129/73  Pulse 81  Temp 97.9 F (36.6 C) (Oral)  Resp 18  SpO2 100%  Physical Exam  Constitutional: He is oriented to person, place, and time. He appears well-developed and well-nourished. No distress.  HENT:  Head: Normocephalic and atraumatic.  Eyes: Conjunctivae normal and EOM are normal.  Neck: Normal range of motion. Neck supple.  Cardiovascular: Normal rate, regular rhythm and intact distal pulses.   No murmur heard. Pulmonary/Chest: Effort normal and breath sounds normal. He has no rales.       Well healed sternotomy scar  Abdominal: Soft. He exhibits no distension. There is no tenderness.  Musculoskeletal: Normal range of motion. He exhibits no edema.  Neurological: He is alert and oriented to person, place, and time. No cranial nerve deficit.  Skin: Skin is warm and dry.  Psychiatric: He has a normal mood and affect. Thought content normal.    ED Course  Procedures (including critical care time)  ECG nsr at 80 bpm Rad Normal intevals Nl sttw  Labs Reviewed  POCT I-STAT, CHEM 8 - Abnormal; Notable for the following:    Hemoglobin 12.9 (*)     HCT 38.0 (*)     All other components within normal limits  BASIC METABOLIC PANEL  CBC WITH DIFFERENTIAL  PROTIME-INR  TROPONIN I   Ct Head Wo Contrast  04/02/2012  *RADIOLOGY REPORT*  Clinical Data: Right-sided weakness  CT HEAD WITHOUT CONTRAST  Technique:  Contiguous axial images were obtained from the base of the skull through the vertex without contrast.  Comparison: 02/28/2012  Findings: The brain has a normal appearance without evidence for hemorrhage, infarction, hydrocephalus, or mass lesion.  There is no extra axial fluid collection.  The skull and paranasal sinuses are normal.  IMPRESSION:  1.  No acute  intracranial abnormalities.   Original Report Authenticated By: Rosealee Albee, M.D.      No diagnosis found.  5:46 PM Feels better after IVF. Wants to go home.    MDM  Weakness - resolved. No signs stroke, acute systemic illness. Acs.    > 1 mo post op. Doubt pe.          Cheri Guppy, MD 04/02/12  1648  Cheri Guppy, MD 04/02/12 1649  Cheri Guppy, MD 04/02/12 [phone]

## 2012-04-02 NOTE — ED Notes (Addendum)
Pt was at work began having headache, nausea and generalized weakness about 30 mins ago. Pt came here at 1100 today. Pt fell 2 days with similar symptoms but did not seek medical care. LSN 1400.

## 2012-04-02 NOTE — ED Notes (Signed)
Reports had aortic dissection heart surgery 02-20-12. States was at work today, onset 1545 blurred vision, visual disturbances, generalized weakness, & nausea no emesis

## 2012-04-02 NOTE — ED Notes (Signed)
Grips Left > right. No facial droop or slurred speech.

## 2012-04-03 ENCOUNTER — Telehealth: Payer: Self-pay

## 2012-04-03 NOTE — Telephone Encounter (Signed)
[name] was offered appt today to see PA. He denied OV, states that he has no transportation. He said he is still feeling weak this am. He is not going to work today and will cont. to rest. Vital signs unknown, He is scheduled to see Dr Hart Rochester for Carotid US on 04/11/12 and Dr Donata Clay 04/12/12.  He feels like maybe he went back to work to soon and will call back if not improving by tomorrow.

## 2012-04-05 ENCOUNTER — Telehealth: Payer: Self-pay | Admitting: Cardiovascular Disease

## 2012-04-05 NOTE — Telephone Encounter (Signed)
Changing statin is ok Check carotid duplex to make sure arch vessels ok post disection

## 2012-04-05 NOTE — Telephone Encounter (Signed)
HAD A LONG DISCUSSION WITH PT RE  A COUPLE OF EPISODES HE HAS EXPERIENCED   THE PREVIOUS WEEK  SUFFERED FROM  BLURRED VISION AND WEAKNESS   AND THEN HAD ANOTHER EPISODE ON SUN   WHICH HE WENT TO ER FOR  AND AFTER AN HOUR STARTED FEELING BETTER   STILL CONTINUES TO C/O WEAKNESS   NOT SURE WHETHER LIPITOR  IS THE CULPRIT  WAS UNABLE TO TAKE IN THE PAST  WILL SWITCH TO CRESTOR 5 MG  STARTING  TOMORROW PER PMD  ALSO PER PT LOPRESSOR 25 MG BID AND LISINORPIL 10 MG QD ARE NEW   B/P HAS  BEEN  RUNNING [id number] AND HEART RATE  RUNNING 74-104  PT AWARE WILL FORWARD TO DR Eden Emms FOR REVIEW

## 2012-04-05 NOTE — Telephone Encounter (Signed)
New Problem:    Patient called in because he is feeling extremely fatigued and is having some SOB wanted to know what might be happening.  Patient was easing into work and while at work his vision blurred and his energy drained out of him.  Patient went to the ER and after an hour was discharged and felt better.  Since then he has been experiencing fatigue, rapid heart beat, SOB.  Please call back before 4pm.

## 2012-04-06 NOTE — Telephone Encounter (Signed)
HOME NUMBER BUSY AND CELL NOT ACCEPTING PHONE CALLS WILL TRY LATER .Zack Seal

## 2012-04-07 ENCOUNTER — Other Ambulatory Visit: Payer: Self-pay | Admitting: Cardiothoracic Surgery

## 2012-04-07 DIAGNOSIS — I309 Acute pericarditis, unspecified: Secondary | ICD-10-CM

## 2012-04-07 DIAGNOSIS — I714 Abdominal aortic aneurysm, without rupture: Secondary | ICD-10-CM

## 2012-04-10 ENCOUNTER — Ambulatory Visit (INDEPENDENT_AMBULATORY_CARE_PROVIDER_SITE_OTHER): Payer: 59 | Admitting: Pharmacist

## 2012-04-10 ENCOUNTER — Encounter: Payer: Self-pay | Admitting: Vascular Surgery

## 2012-04-10 DIAGNOSIS — I824Y9 Acute embolism and thrombosis of unspecified deep veins of unspecified proximal lower extremity: Secondary | ICD-10-CM

## 2012-04-10 DIAGNOSIS — Z7901 Long term (current) use of anticoagulants: Secondary | ICD-10-CM

## 2012-04-10 LAB — POCT INR: INR: 2.1

## 2012-04-10 NOTE — Patient Instructions (Signed)
Patient instructed to take medications as defined in the Anti-coagulation Track section of this encounter.  Patient instructed to take today's dose.  Patient verbalized understanding of these instructions.    

## 2012-04-10 NOTE — Progress Notes (Signed)
Anti-Coagulation Progress Note  [name] is a 61 y.o. male who is currently on an anti-coagulation regimen.    RECENT RESULTS: Recent results are below, the most recent result is correlated with a dose of 30 mg. per week: Lab Results  Component Value Date   INR 2.10 04/10/2012   INR 1.90* 04/02/2012   INR 2.30 03/27/2012    ANTI-COAG DOSE:   Latest dosing instructions   Total Sun Mon Tue Wed Thu Fri Sat   40 5 mg 7.5 mg 5 mg 5 mg 7.5 mg 5 mg 5 mg    (5 mg1) (5 mg1.5) (5 mg1) (5 mg1) (5 mg1.5) (5 mg1) (5 mg1)         ANTICOAG SUMMARY: Anticoagulation Episode Summary              Current INR goal 2.0 Next INR check 04/24/2012   INR from last check 2.10! (04/10/2012)     Weekly max dose (mg)  Target end date    Indications Long term (current) use of anticoagulants, Acute venous embolism and thrombosis of deep vessels of proximal lower extremity   [facility] location [facility]    Send INR reminders to    Comments Discharged from hospital 25-SEP-13 after acute admission for emergency surgery early September for triple A repair of class A aneurysm by Dr. Morton Peters. Intra-operatively, PLCT fell to 40,000. Patient required administration of rFVIIa. Subsequently despite efforts of mechanical prophylaxis against VTE postoperatively, patient did develop lower extremity DVT of left leg. I would consider this a "known provoked" DVT (secondary to rFVIIa). Given this, until suggested otherwise, will target 79mo Tx.            ANTICOAG TODAY: Anticoagulation Summary as of 04/10/2012              INR goal 2.0     Selected INR 2.10! (04/10/2012) Next INR check 04/24/2012   Weekly max dose (mg)  Target end date    Indications Long term (current) use of anticoagulants, Acute venous embolism and thrombosis of deep vessels of proximal lower extremity    Anticoagulation Episode Summary              IN[facility]erred lab    Send  INR reminders to    Comments Discharged from hospital 25-SEP-13 after acute admission for emergency surgery early September for triple A repair of class A aneurysm by Dr. Morton Peters. Intra-operatively, PLCT fell to 40,000. Patient required administration of rFVIIa. Subsequently despite efforts of mechanical prophylaxis against VTE postoperatively, patient did develop lower extremity DVT of left leg. I would consider this a "known provoked" DVT (secondary to rFVIIa). Given this, until suggested otherwise, will target 79mo Tx.            PATIENT INSTRUCTIONS: Patient Instructions  Patient instructed to take medications as defined in the Anti-coagulation Track section of this encounter.  Patient instructed to take today's dose.  Patient verbalized understanding of these instructions.        FOLLOW-UP Return in 2 weeks (on 04/24/2012) for Follow up INR at 1100h.  Hulen Luster, III Pharm.D., CACP

## 2012-04-11 ENCOUNTER — Encounter: Payer: Self-pay | Admitting: Vascular Surgery

## 2012-04-11 ENCOUNTER — Ambulatory Visit (INDEPENDENT_AMBULATORY_CARE_PROVIDER_SITE_OTHER): Payer: 59 | Admitting: Vascular Surgery

## 2012-04-11 ENCOUNTER — Ambulatory Visit (INDEPENDENT_AMBULATORY_CARE_PROVIDER_SITE_OTHER): Payer: 59 | Admitting: *Deleted

## 2012-04-11 VITALS — BP 122/69 | HR 76 | Ht 76.0 in | Wt 226.0 lb

## 2012-04-11 DIAGNOSIS — H539 Unspecified visual disturbance: Secondary | ICD-10-CM

## 2012-04-11 DIAGNOSIS — I714 Abdominal aortic aneurysm, without rupture: Secondary | ICD-10-CM

## 2012-04-11 DIAGNOSIS — I6529 Occlusion and stenosis of unspecified carotid artery: Secondary | ICD-10-CM

## 2012-04-11 DIAGNOSIS — Z48812 Encounter for surgical aftercare following surgery on the circulatory system: Secondary | ICD-10-CM

## 2012-04-11 NOTE — Progress Notes (Signed)
[facility] [place] HISTORY AND PHYSICAL   CC: Donata Clay, Theron Arista, MD  HPI: This is a 61 y.o. male here for occular events since open heart surgery, which was February 20, 2012.  He has a hx of migraines prior to these new events, which he would only get ~ every 6 years.  These are much more frequent since his surgery.  He states that sometimes these are brought on by anger/pre-anger or pain or stress.  States that he was sitting in front of computer and states he felt as if he had "smudges on his glasses".  He states that this affects both eyes.  With the event in the ER, he has lost energy and is just now recovering from this.  He is referred from thoracic surgery for carotid duplex.  He states that with his surgery, for the cutdown on the subclavian artery, there was damage to his brachial plexus, which has caused his fingers to be numb.  States that he is having numbness in his right leg with walking.  Denies claudication.    Past Medical History  Diagnosis Date  . OBSTRUCTIVE SLEEP APNEA 02/2008 sleep study  . MIGRAINE HEADACHE   . ERECTILE DYSFUNCTION, ORGANIC   . Unspecified vitamin D deficiency   . HYPERLIPIDEMIA   . ASTHMA   . ALLERGIC RHINITIS   . Dissecting aortic aneurysm, thoracic 02/20/2012    s/p emergent surgical repair  . Hyperglycemia   . Anxiety    Past Surgical History  Procedure Date  . Nasal fx without repair   . Thoracic aortic aneurysm repair 02/20/2012    Procedure: THORACIC ASCENDING ANEURYSM REPAIR (AAA);  Surgeon: Kerin Perna, MD;  Location: [facility] OR;  Service: Open Heart Surgery;  Laterality: N/A;    Allergies  Allergen Reactions  . Testosterone Shortness Of Breath    Medication:Androgel Pt states trouble breathing at night    Current Outpatient Prescriptions  Medication Sig Dispense Refill  . aspirin 81 MG chewable tablet Chew 1 tablet (81 mg total) by mouth daily.      . cholecalciferol (VITAMIN D) 1000 UNITS tablet Take 2,000  Units by mouth daily.      [name] EPINEPHrine (EPI-PEN) 0.3 mg/0.3 mL DEVI Inject 0.3 mg into the muscle as needed. For anaphylaxis       . ferrous fumarate-b12-vitamic C-folic acid (TRINSICON / FOLTRIN) capsule Take 1 capsule by mouth 2 (two) times daily after a meal.  60 capsule  1  . fexofenadine (ALLEGRA) 180 MG tablet Take 180 mg by mouth daily.        . fluticasone (FLONASE) 50 MCG/ACT nasal spray Place 2 sprays into the nose daily.      [name] glyBURIDE (DIABETA) 5 MG tablet Take 1 tablet (5 mg total) by mouth daily with breakfast.  30 tablet  1  . HYDROcodone-acetaminophen (NORCO/VICODIN) 5-325 MG per tablet Take 1 tablet by mouth every 4 (four) hours as needed.  30 tablet  0  . lisinopril (PRINIVIL,ZESTRIL) 10 MG tablet Take 5 mg by mouth 2 (two) times daily.      . metoprolol tartrate (LOPRESSOR) 25 MG tablet Take 1 tablet (25 mg total) by mouth 2 (two) times daily.      [name] omega-3 acid ethyl esters (LOVAZA) 1 G capsule Take 1 g by mouth 2 (two) times daily.      . polyethylene glycol (MIRALAX / GLYCOLAX) packet Take 17 g by mouth daily.      . rosuvastatin (  CRESTOR) 10 MG tablet Take 10 mg by mouth daily.      [name] tretinoin (RETIN-A) 0.1 % cream Apply 1 application topically daily as needed. For acne or rash      . warfarin (COUMADIN) 5 MG tablet Take 1 tablet (5 mg total) by mouth at bedtime.  30 tablet  1  . clotrimazole-betamethasone (LOTRISONE) cream Use as directed twice per day as needed  15 g  1  . pantoprazole (PROTONIX) 40 MG tablet Take 1 tablet (40 mg total) by mouth daily at 12 noon.  30 tablet  1  . DISCONTD: glyBURIDE (DIABETA) 5 MG tablet Take 5 mg by mouth daily with breakfast.        Family History  Problem Relation Age of Onset  . Diabetes Mother   . Heart disease Maternal Uncle   . Heart disease Paternal Uncle   . Heart disease Cousin   . Lung cancer Cousin     2 cousins    History   Social History  . Marital Status: Divorced    Spouse Name: N/A    Number of  Children: N/A  . Years of Education: N/A   Occupational History  . Not on file.   Social History Main Topics  . Smoking status: Former Smoker -- 0.1 packs/day  . Smokeless tobacco: Not on file   Comment: 10 cigs/day, quit 02/2012 after TAA emergent repair  . Alcohol Use: Yes     Rarely  . Drug Use: No  . Sexually Active: Not on file   Other Topics Concern  . Not on file   Social History Narrative  . No narrative on file     ROS: [x]  Positive   [ ]  Negative   [ ]  All sytems reviewed and are negative  Cardiac: [] chest pain; [] chest pressure; [] palpitations; [] SOB lying flat; [] DOE; [x]  lack of enery Vascular:  [] pain in legs with walking; [] pain in legs when lying flat; [x] Hx of DVT; [] Hx phlebitis; [] swelling in legs; [] varicose veins Pulmonary: [] productive cough; [] asthma; [] wheezing Neurologic:  [] Hx CVA;  [] weakness in arms or legs; [x] numbness in arms or legs-see HPI; [] difficulty in speaking or slurred speech; [] temporary loss of vision in one eye; [] dizziness;[x]  visual changes-see HPI Hematologic:  [] bleeding problems; [] clots easily [x]  on coumadin GI:  [] vomiting blood; []  blood in stool; [] PUD; [x]  diverticulosis-GI bleeding a couple of episodes this year. GU: []  Dysuria; [] hematuria; [] ESRD Psychiatric:  [] Hx major depression Integumentary:  [] rashes; [] ulcers Constitutional:  [] fever; [] chills    PHYSICAL EXAMINATION:  Filed Vitals:   04/11/12 1607  BP: 122/69  Pulse:    Body mass index is 27.51 kg/(m^2).  General:  WDWN in NAD Gait: Normal HENT: WNL Eyes: PERRL Pulmonary: normal non-labored breathing , without Rales, rhonchi,  wheezing Cardiac: RRR, without  Murmurs, rubs or gallops; + left carotid bruit Abdomen: soft, NT, no masses Skin: no rashes, ulcers noted Vascular Exam/Pulses: + palpable radial and DP pulses bilaterally Extremities without ischemic changes, no Gangrene , no cellulitis; no open wounds;  Musculoskeletal: no muscle wasting or  atrophy  Neurologic: A&O X 3; Appropriate Affect ; SENSATION: normal; MOTOR FUNCTION:  moving all extremities equally. Speech is fluent/normal  Non-Invasive Vascular Imaging:  1.  Evidence of less than 40% stenoses noted ni the bilateral proximal internal carotid arteries. 2. Significant stenosis of the left external carotid artery noted. 3.  No evidence of a significant stenosis of the right external and bilateral common carotid arteries.  ASSESSMENT: 61 y.o. male  having occular events with loss of energy.  He has a hx of migraines, but they were coming ~ every 6 years and since surgery, he is having them frequently.  PLAN: His visual changes are not due to his carotid arteries.  Pt with history of migraines and may need referral to neurology if HA/visual changes continue.  Doreatha Massed, PA-C [facility] [phone]  Clinic MD [facility]   Agree with above  Today we ordered carotid duplex exam which I reviewed and interpreted. There is no significant flow reduction in either internal carotid artery. The left external carotid artery has a significant stenosis which would not be clinically relevant. His symptoms do not seem compatible with retinal ischemia i.e. amaurosis fugax since they are bilateral. He has no other symptoms of lateralizing weakness which are suggestive of TIAs.  We will need to repeat a CT angiogram in 6 months to see if the infrarenal abdominal aorta is amenable to stent grafting. 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  I discussed this at length with the patient and he understands and he'll return in 6 months with CT angiogram of the chest and abdominal aorta.

## 2012-04-12 ENCOUNTER — Ambulatory Visit (INDEPENDENT_AMBULATORY_CARE_PROVIDER_SITE_OTHER): Payer: Self-pay | Admitting: Cardiothoracic Surgery

## 2012-04-12 ENCOUNTER — Ambulatory Visit
Admission: RE | Admit: 2012-04-12 | Discharge: 2012-04-12 | Disposition: A | Discharge status: Home / Self Care | Payer: 59 | Source: Ambulatory Visit | Attending: Cardiothoracic Surgery | Admitting: Cardiothoracic Surgery

## 2012-04-12 ENCOUNTER — Encounter: Payer: Self-pay | Admitting: Cardiothoracic Surgery

## 2012-04-12 ENCOUNTER — Other Ambulatory Visit: Payer: Self-pay | Admitting: Cardiothoracic Surgery

## 2012-04-12 VITALS — BP 114/70 | HR 88 | Resp 16 | Ht 76.0 in | Wt 228.0 lb

## 2012-04-12 DIAGNOSIS — I7100 Dissection of unspecified site of aorta: Secondary | ICD-10-CM

## 2012-04-12 DIAGNOSIS — I714 Abdominal aortic aneurysm, without rupture, unspecified: Secondary | ICD-10-CM

## 2012-04-12 DIAGNOSIS — Z09 Encounter for follow-up examination after completed treatment for conditions other than malignant neoplasm: Secondary | ICD-10-CM

## 2012-04-12 DIAGNOSIS — I309 Acute pericarditis, unspecified: Secondary | ICD-10-CM

## 2012-04-12 NOTE — Progress Notes (Signed)
PCP is Rene Paci, MD Referring Provider is Glynn Octave, MD  Chief Complaint  Patient presents with  . Routine Post Op    3 wk with cxr and consult with [facility]/Dr. [facility] re: visual changes/carotid duplex    HPI: Patient returns for routine followup after repair of acute type a ascending thoracic aortic dissection with hemi-arch reconstruction. Postoperatively patient developed DVT and has been placed on Coumadin. The patient had a postoperative pericardial effusion which improved with time despite being placed on Coumadin. He is been maintained in sinus rhythm. He has had persistent visual symptoms such as curtainlike blindness as well as scotomata. Followup CT scans of the brain in all been negative. Carotid scans showed no significant extracranial carotid disease. The patient is been taking aspirin 81 mg as well as Coumadin with an INR of 2.0. He has not been ambulating very much complaining of right hip pain with ambulation. 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  The patient also complains of weakness and atrophy of the right upper arm. This is improved with time. He'll be referred to physical therapy for this problem.  His blood pressure is been well maintained and controlled with lisinopril metoprolol combination therapy. His Coumadin has been regulated by the Coumadin clinic by Almeta Monas D  Past Medical History  Diagnosis Date  . OBSTRUCTIVE SLEEP APNEA 02/2008 sleep study  . MIGRAINE HEADACHE   . ERECTILE DYSFUNCTION, ORGANIC   . Unspecified vitamin D deficiency   . HYPERLIPIDEMIA   . ASTHMA   . ALLERGIC RHINITIS   . Dissecting aortic aneurysm, thoracic 02/20/2012    s/p emergent surgical repair  . Hyperglycemia   . Anxiety     Past Surgical History  Procedure Date  . Nasal fx without repair   . Thoracic aortic aneurysm repair 02/20/2012    Procedure: THORACIC  ASCENDING ANEURYSM REPAIR (AAA);  Surgeon: Kerin Perna, MD;  Location: [facility] OR;  Service: Open Heart Surgery;  Laterality: N/A;    Family History  Problem Relation Age of Onset  . Diabetes Mother   . Heart disease Maternal Uncle   . Heart disease Paternal Uncle   . Heart disease Cousin   . Lung cancer Cousin     2 cousins    Social History History  Substance Use Topics  . Smoking status: Former Smoker -- 0.1 packs/day  . Smokeless tobacco: Not on file   Comment: 10 cigs/day, quit 02/2012 after TAA emergent repair  . Alcohol Use: Yes     Rarely    Current Outpatient Prescriptions  Medication Sig Dispense Refill  . aspirin 81 MG chewable tablet Chew 1 tablet (81 mg total) by mouth daily.      . cholecalciferol (VITAMIN D) 1000 UNITS tablet Take 2,000 Units by mouth daily.      [name] EPINEPHrine (EPI-PEN) 0.3 mg/0.3 mL DEVI Inject 0.3 mg into the muscle as needed. For anaphylaxis       . ferrous fumarate-b12-vitamic C-folic acid (TRINSICON / FOLTRIN) capsule Take 1 capsule by mouth 2 (two) times daily after a meal.  60 capsule  1  . fexofenadine (ALLEGRA) 180 MG tablet Take 180 mg by mouth daily.        . fluticasone (FLONASE) 50 MCG/ACT nasal spray Place 2 sprays into the nose daily.      [name] glyBURIDE (DIABETA) 5 MG tablet Take 1  tablet (5 mg total) by mouth daily with breakfast.  30 tablet  1  . HYDROcodone-acetaminophen (NORCO/VICODIN) 5-325 MG per tablet Take 1 tablet by mouth every 4 (four) hours as needed.  30 tablet  0  . lisinopril (PRINIVIL,ZESTRIL) 10 MG tablet Take 5 mg by mouth 2 (two) times daily.      . metoprolol tartrate (LOPRESSOR) 25 MG tablet Take 1 tablet (25 mg total) by mouth 2 (two) times daily.      [name] omega-3 acid ethyl esters (LOVAZA) 1 G capsule Take 1 g by mouth 2 (two) times daily.      . polyethylene glycol (MIRALAX / GLYCOLAX) packet Take 17 g by mouth daily.      . rosuvastatin (CRESTOR) 10 MG tablet Take 10 mg by mouth daily.      [name] tretinoin (RETIN-A)  0.1 % cream Apply 1 application topically daily as needed. For acne or rash      . warfarin (COUMADIN) 5 MG tablet Take 5 mg by mouth at bedtime. OR AS DIRECTED.      [name] DISCONTD: warfarin (COUMADIN) 5 MG tablet Take 1 tablet (5 mg total) by mouth at bedtime.  30 tablet  1    Allergies  Allergen Reactions  . Testosterone Shortness Of Breath    Medication:Androgel Pt states trouble breathing at night    Review of Systems surgical incision well-healed gaining weight sleeping well no edema  BP 114/70  Pulse 88  Resp 16  Ht 6\' 4"  (1.93 m)  Wt 228 lb (103.42 kg)  BMI 27.75 kg/m2  SpO2 97% Physical Exam Alert and responsive No visual field cut noted No JVD or carotid bruit \ Peripheral pulses strong and all extremities and neck Sternal incision well-healed No murmur or gallop Lungs clear to auscultation  Diagnostic Tests: Chest x-ray shows normal cardiac silhouette, sternal wires intact , no pleural effusion  Impression: Persistent visual problems we'll refer to ophthalmologist for retinal vessel evaluation. Patient referred to physical therapy for strengthening of his right upper arm which appears to be. Improving with time. We will also repeat his 2-D echo to evaluate the effusion.  Plan: Return in 2 weeks after he seen by ophthalmology to evaluate his retinal vessels. Continue Coumadin with careful monitoring of the INR. Patient is encouraged to walk more.

## 2012-04-12 NOTE — Addendum Note (Signed)
Addended by: Melodye Ped C on: 04/12/2012 10:33 AM   Modules accepted: Orders

## 2012-04-12 NOTE — Addendum Note (Signed)
Addended by: Melodye Ped C on: 04/12/2012 12:28 PM   Modules accepted: Orders

## 2012-04-13 ENCOUNTER — Ambulatory Visit (HOSPITAL_COMMUNITY)
Admission: RE | Admit: 2012-04-13 | Discharge: 2012-04-13 | Disposition: A | Discharge status: Home / Self Care | Payer: 59 | Source: Ambulatory Visit | Attending: Cardiothoracic Surgery | Admitting: Cardiothoracic Surgery

## 2012-04-13 DIAGNOSIS — I319 Disease of pericardium, unspecified: Secondary | ICD-10-CM | POA: Insufficient documentation

## 2012-04-13 DIAGNOSIS — I309 Acute pericarditis, unspecified: Secondary | ICD-10-CM

## 2012-04-13 DIAGNOSIS — I517 Cardiomegaly: Secondary | ICD-10-CM | POA: Insufficient documentation

## 2012-04-13 NOTE — Progress Notes (Signed)
  Echocardiogram 2D Echocardiogram has been performed.  [place]ian Co 04/13/2012, 12:55 PM

## 2012-04-20 NOTE — Telephone Encounter (Signed)
WAS FINALLY ABLE TO SPEAK WITH PT PER  PT DR VAN TRIGT ORDER A CAROTID WHICH WAS  OKAY  AND SAW PT  THE END OF OCT WAS ALSO REFERRED TO OPTHALMOLOGIST  PER PT ALL TESTING  WAS OKAY AS WELL    PT STATED OPTHALMOLOGIST  THOUGHT  MAY NEED TO REFER TO NEUROLOGY.PT WILL FOLLOW UP WITH GETTING THAT APPT SCHEDULED /CY

## 2012-04-24 ENCOUNTER — Other Ambulatory Visit: Payer: Self-pay | Admitting: Cardiothoracic Surgery

## 2012-04-24 ENCOUNTER — Ambulatory Visit (INDEPENDENT_AMBULATORY_CARE_PROVIDER_SITE_OTHER): Payer: 59 | Admitting: Pharmacist

## 2012-04-24 DIAGNOSIS — I712 Thoracic aortic aneurysm, without rupture, unspecified: Secondary | ICD-10-CM

## 2012-04-24 DIAGNOSIS — I824Y9 Acute embolism and thrombosis of unspecified deep veins of unspecified proximal lower extremity: Secondary | ICD-10-CM

## 2012-04-24 DIAGNOSIS — Z7901 Long term (current) use of anticoagulants: Secondary | ICD-10-CM

## 2012-04-24 LAB — POCT INR: INR: 2.5

## 2012-04-24 NOTE — Progress Notes (Signed)
Anti-Coagulation Progress Note  [name] is a 61 y.o. male who is currently on an anti-coagulation regimen.    RECENT RESULTS: Recent results are below, the most recent result is correlated with a dose of 40 mg. per week:  Called by Dr. Kathlee Nations Trigt this morning who indicated that the concerns of exacerbation of pericardial effusion (bloody) with warfarin if INR were to be permitted to go to high--are "now behind [place]". As such, will begin now to target an INR range of 2.0 - 3.0  Today's value = 2.5. Will continue same regimen.  Lab Results  Component Value Date   INR 2.50 04/24/2012   INR 2.10 04/10/2012   INR 1.90* 04/02/2012    ANTI-COAG DOSE:   Latest dosing instructions   Total Sun Mon Tue Wed Thu Fri Sat   40 5 mg 7.5 mg 5 mg 5 mg 7.5 mg 5 mg 5 mg    (5 mg1) (5 mg1.5) (5 mg1) (5 mg1) (5 mg1.5) (5 mg1) (5 mg1)         ANTICOAG SUMMARY: Anticoagulation Episode Summary              Current INR goal 2.0 Next INR check 05/15/2012   INR from last check 2.50! (04/24/2012)     Weekly max dose (mg)  Target end date    Indications Long term (current) use of anticoagulants [V58.61], Acute venous embolism and thrombosis of deep vessels of proximal lower extremity [453.41]   INR check location [facility] Preferred lab    Send INR reminders to    Comments Discharged from hospital 25-SEP-13 after acute admission for emergency surgery early September for triple A repair of class A aneurysm by Dr. Morton Peters. Intra-operatively, PLCT fell to 40,000. Patient required administration of rFVIIa. Subsequently despite efforts of mechanical prophylaxis against VTE postoperatively, patient did develop lower extremity DVT of left leg. I would consider this a "known provoked" DVT (secondary to rFVIIa). Given this, until suggested otherwise, will target 79mo Tx.            ANTICOAG TODAY: Anticoagulation Summary as of 04/24/2012              INR goal 2.0     Selected INR 2.50!  (04/24/2012) Next INR check 05/15/2012   Weekly max dose (mg)  Target end date    Indications Long term (current) use of anticoagulants [V58.61], Acute venous embolism and thrombosis of deep vessels of proximal lower extremity [453.41]    Anticoagulation Episode Summary              INR check location [facility] Preferred lab    Send INR reminders to    Comments Discharged from hospital 25-SEP-13 after acute admission for emergency surgery early September for triple A repair of class A aneurysm by Dr. Morton Peters. Intra-operatively, PLCT fell to 40,000. Patient required administration of rFVIIa. Subsequently despite efforts of mechanical prophylaxis against VTE postoperatively, patient did develop lower extremity DVT of left leg. I would consider this a "known provoked" DVT (secondary to rFVIIa). Given this, until suggested otherwise, will target 79mo Tx.            PATIENT INSTRUCTIONS: Patient Instructions  Patient instructed to take medications as defined in the Anti-coagulation Track section of this encounter.  Patient instructed to take today's dose.  Patient verbalized understanding of these instructions.        FOLLOW-UP Return in 3 weeks (on 05/15/2012) for Follow up INR at  1115h.  Hulen Luster, III Pharm.D., CACP

## 2012-04-24 NOTE — Patient Instructions (Signed)
Patient instructed to take medications as defined in the Anti-coagulation Track section of this encounter.  Patient instructed to take today's dose.  Patient verbalized understanding of these instructions.    

## 2012-04-26 ENCOUNTER — Ambulatory Visit: Payer: Self-pay | Admitting: Cardiothoracic Surgery

## 2012-04-26 ENCOUNTER — Other Ambulatory Visit: Payer: Self-pay | Admitting: Physical Medicine & Rehabilitation

## 2012-04-26 ENCOUNTER — Other Ambulatory Visit: Payer: Self-pay | Admitting: Internal Medicine

## 2012-04-26 MED ORDER — METOPROLOL TARTRATE 25 MG PO TABS: 25.0000 mg | ORAL_TABLET | Freq: Two times a day (BID) | ORAL | Status: DC

## 2012-04-26 NOTE — Telephone Encounter (Signed)
Rx faxed to  pharmacy 

## 2012-04-26 NOTE — Telephone Encounter (Signed)
Last written 02/11/2011 #30 with 1 refill-please advise.

## 2012-04-27 ENCOUNTER — Encounter: Payer: Self-pay | Admitting: Cardiothoracic Surgery

## 2012-04-27 ENCOUNTER — Ambulatory Visit (INDEPENDENT_AMBULATORY_CARE_PROVIDER_SITE_OTHER): Payer: Self-pay | Admitting: Cardiothoracic Surgery

## 2012-04-27 VITALS — BP 134/81 | HR 88 | Resp 20 | Ht 76.0 in | Wt 228.0 lb

## 2012-04-27 DIAGNOSIS — I712 Thoracic aortic aneurysm, without rupture: Secondary | ICD-10-CM

## 2012-04-27 NOTE — Progress Notes (Signed)
PCP is Rene Paci, MD Referring Provider is Glynn Octave, MD  Chief Complaint  Patient presents with  . Follow-up    f/u from 02/20/12 emerg repair of TAA, visual changes    HPI: Patient returns for 2 month followup after repair of a type a ascending thoracic aortic dissection with a straight graft to the arch and repair of the aortic valve. He is progressing overall with improved strength, return to work, and attending cardiac rehabilitation. He has some right arm weakness, bilateral plantar foot pain, and is taking Coumadin for postop left leg DVT for 3 months. He has stress anxiety attacks manifest by ocular symptoms and some depression with emotional lability and crying episodes  Patient denies any significant neurologic events, bleeding complications from the Coumadin, symptoms of angina or CHF and he denies any abnormalities with the surgical incisions   Past Medical History  Diagnosis Date  . OBSTRUCTIVE SLEEP APNEA 02/2008 sleep study  . MIGRAINE HEADACHE   . ERECTILE DYSFUNCTION, ORGANIC   . Unspecified vitamin D deficiency   . HYPERLIPIDEMIA   . ASTHMA   . ALLERGIC RHINITIS   . Dissecting aortic aneurysm, thoracic 02/20/2012    s/p emergent surgical repair  . Hyperglycemia   . Anxiety     Past Surgical History  Procedure Date  . Nasal fx without repair   . Thoracic aortic aneurysm repair 02/20/2012    Procedure: THORACIC ASCENDING ANEURYSM REPAIR (AAA);  Surgeon: Kerin Perna, MD;  Location: [facility] OR;  Service: Open Heart Surgery;  Laterality: N/A;    Family History  Problem Relation Age of Onset  . Diabetes Mother   . Heart disease Maternal Uncle   . Heart disease Paternal Uncle   . Heart disease Cousin   . Lung cancer Cousin     2 cousins    Social History History  Substance Use Topics  . Smoking status: Former Smoker -- 0.1 packs/day  . Smokeless tobacco: Not on file     Comment: 10 cigs/day, quit 02/2012 after TAA emergent repair  . Alcohol Use: Yes      Comment: Rarely    Current Outpatient Prescriptions  Medication Sig Dispense Refill  . ALPRAZolam (XANAX) 1 MG tablet TAKE 1 TABLET BY MOUTH AT BEDTIME AS NEEDED  30 tablet  1  . aspirin 81 MG chewable tablet Chew 1 tablet (81 mg total) by mouth daily.      . cholecalciferol (VITAMIN D) 1000 UNITS tablet Take 2,000 Units by mouth daily.      [name] EPINEPHrine (EPI-PEN) 0.3 mg/0.3 mL DEVI Inject 0.3 mg into the muscle as needed. For anaphylaxis       . ferrous fumarate-b12-vitamic C-folic acid (TRINSICON / FOLTRIN) capsule Take 1 capsule by mouth 2 (two) times daily after a meal.  60 capsule  1  . fexofenadine (ALLEGRA) 180 MG tablet Take 180 mg by mouth daily.        . fluticasone (FLONASE) 50 MCG/ACT nasal spray Place 2 sprays into the nose daily.      [name] glyBURIDE (DIABETA) 5 MG tablet Take 1 tablet (5 mg total) by mouth daily with breakfast.  30 tablet  1  . HYDROcodone-acetaminophen (NORCO/VICODIN) 5-325 MG per tablet Take 1 tablet by mouth every 4 (four) hours as needed.  30 tablet  0  . lisinopril (PRINIVIL,ZESTRIL) 10 MG tablet Take 5 mg by mouth 2 (two) times daily.      . metoprolol tartrate (LOPRESSOR) 25 MG tablet Take 1 tablet (25  mg total) by mouth 2 (two) times daily.  60 tablet  3  . omega-3 acid ethyl esters (LOVAZA) 1 G capsule Take 1 g by mouth 2 (two) times daily.      . polyethylene glycol (MIRALAX / GLYCOLAX) packet Take 17 g by mouth daily.      . rosuvastatin (CRESTOR) 10 MG tablet Take 10 mg by mouth daily.      [name] tretinoin (RETIN-A) 0.1 % cream Apply 1 application topically daily as needed. For acne or rash      . warfarin (COUMADIN) 5 MG tablet Take 5 mg by mouth at bedtime. OR AS DIRECTED.        Allergies  Allergen Reactions  . Testosterone Shortness Of Breath    Medication:Androgel Pt states trouble breathing at night    Review of Systems no fever no night sweats no orthopnea no ankle edema  BP 134/81  Pulse 88  Resp 20  Ht 6\' 4"  (1.93 m)  Wt 228 lb  (103.42 kg)  BMI 27.75 kg/m2  SpO2 98% Physical Exam Alert and oriented Lungs clear Cardiac rhythm regular murmur Improved right hand grip strength Normal pulses in all extremities  Diagnostic Tests: Recent chest x-ray shows normal cardiac silhouette clear lung fields no pleural effusion Recent 2-D echocardiogram shows normal biventricular function no AI no significant pericardial effusion Impression: Doing well 2 months postop repair of type A ascending dissection He has a saccular infrarenal AAA to be evaluated for therapy by Dr. Adriana Mccallum is hoped that this can be repaired with a stent graft if the false lumen of the abdominal aorta closes and heals His Coumadin therapy will stop by mid December. I will see the patient back in 2 months  Plan: Return for office check in 2 months

## 2012-04-28 ENCOUNTER — Other Ambulatory Visit: Payer: Self-pay | Admitting: *Deleted

## 2012-04-28 MED ORDER — FE FUMARATE-B12-VIT C-FA-IFC PO CAPS: 1.0000 | ORAL_CAPSULE | Freq: Two times a day (BID) | ORAL | Status: DC

## 2012-04-28 MED ORDER — WARFARIN SODIUM 5 MG PO TABS: 5.0000 mg | ORAL_TABLET | Freq: Every day | ORAL | Status: DC

## 2012-04-30 ENCOUNTER — Encounter: Payer: Self-pay | Admitting: Internal Medicine

## 2012-05-04 ENCOUNTER — Ambulatory Visit (INDEPENDENT_AMBULATORY_CARE_PROVIDER_SITE_OTHER): Payer: 59 | Admitting: Sports Medicine

## 2012-05-04 VITALS — BP 130/70 | Ht 76.0 in | Wt 228.0 lb

## 2012-05-04 DIAGNOSIS — M6258 Muscle wasting and atrophy, not elsewhere classified, other site: Secondary | ICD-10-CM

## 2012-05-04 DIAGNOSIS — R209 Unspecified disturbances of skin sensation: Secondary | ICD-10-CM

## 2012-05-04 DIAGNOSIS — M6250 Muscle wasting and atrophy, not elsewhere classified, unspecified site: Secondary | ICD-10-CM

## 2012-05-04 DIAGNOSIS — R200 Anesthesia of skin: Secondary | ICD-10-CM

## 2012-05-04 NOTE — Patient Instructions (Addendum)
DR IBAZABO-NERVE CONDUCTION STUDIES TUES DEC, 3RD AT 1030A [address] [phone]

## 2012-05-05 NOTE — Progress Notes (Signed)
  Subjective:    Patient ID: [name], male    DOB: [birth year], 61 y.o.   MRN: [id number]  HPI chief complaint: Right arm weakness, bilateral lower extremity numbness  Very pleasant 61 year old right-hand-dominant male comes in today complaining mainly of right hand arm and weakness. He is status post Type A thoracic aortic dissection repair and right axillary artery cannulation on 02/20/2012. Per the patient's report, the surgery lasted several hours. Post operatively he began to notice weakness and numbness in both lower legs and his right arm. Numbness in his lower legs is resolving. Initially it was from the knee down and is now primarily in his feet. Not noticed any weakness in his legs. In regards to the right arm, the weakness and numbness has worsened. He has also began to notice a difference in the way the right arm looks. The numbness he is experiencing is down into his thumb, index finger, and middle finger. Not too much in the way of pain. He denies any neck pain. No problems with his right arm in the past.  Medications are reviewed as is his past medical history. Patient is currently on Coumadin postoperatively    Review of Systems     Objective:   Physical Exam Well-developed, well-nourished. No acute distress.  Cervical spine: Full painless range of motion no tenderness along cervical midline. Negative Spurling's. Neurological exam: Patient has pronounced atrophy of the right biceps and probably the underlying brachialis muscle. Also atrophy along the radial aspect of the forearm. 3-4/5 strength with resisted biceps flexion and supination on the right. Decreased sensation along the C6 dermatome in the right hand. Absent biceps reflex on the right. 1/4 reflexes at the triceps and brachial radialis on the right. Good radial and ulnar pulses. Capillary refill brisk. Right arm: No muscle atrophy. 5/5 strength. 1-2/4 reflexes at the biceps, triceps, and brachioradialis tendons.  Good radial and ulnar pulses.  Bilateral lower extremities: Strength is 5/5 both lower extremities. Reflexes are 2/4 at the Achilles and patellar tendons and equal bilaterally. No muscle atrophy.  Patient walks without a limp       Assessment & Plan:  1. Right arm atrophy secondary to cervical disc herniation versus neuropraxia 2. Improving bilateral lower extremity paresthesias likely secondary to anesthesia 3. Status post recent thoracic aortic dissection repair and right axillary artery cannulation  I'm going to order an EMG/nerve conduction study to help localize the area of neurological insult. Unfortunately, given his degree of atrophy and weakness, I am not overly optimistic that he will regain full function of his right arm. I have encouraged him to continue with activity and at some point we may need to place him into physical therapy/occupational therapy. I will followup with him after his EMG is currently scheduled for December 3. He will followup with his cardiovascular surgeon per their request.

## 2012-05-08 ENCOUNTER — Ambulatory Visit: Payer: 59

## 2012-05-15 ENCOUNTER — Ambulatory Visit: Payer: 59 | Admitting: Pharmacist

## 2012-05-15 DIAGNOSIS — D649 Anemia, unspecified: Secondary | ICD-10-CM

## 2012-05-15 LAB — CBC
Hemoglobin: 13.8 g/dL (ref 13.0–17.0)
RBC: 4.84 MIL/uL (ref 4.22–5.81)

## 2012-05-23 ENCOUNTER — Ambulatory Visit (INDEPENDENT_AMBULATORY_CARE_PROVIDER_SITE_OTHER): Payer: 59 | Admitting: Cardiovascular Disease

## 2012-05-23 ENCOUNTER — Ambulatory Visit (HOSPITAL_COMMUNITY): Payer: 59 | Attending: Cardiovascular Disease | Admitting: Radiology

## 2012-05-23 ENCOUNTER — Encounter: Payer: Self-pay | Admitting: Cardiovascular Disease

## 2012-05-23 ENCOUNTER — Encounter: Payer: Self-pay | Admitting: Sports Medicine

## 2012-05-23 ENCOUNTER — Telehealth: Payer: Self-pay | Admitting: Sports Medicine

## 2012-05-23 ENCOUNTER — Encounter (INDEPENDENT_AMBULATORY_CARE_PROVIDER_SITE_OTHER): Payer: 59

## 2012-05-23 VITALS — BP 120/66 | HR 88 | Resp 16 | Ht 76.0 in | Wt 226.6 lb

## 2012-05-23 DIAGNOSIS — I824Y9 Acute embolism and thrombosis of unspecified deep veins of unspecified proximal lower extremity: Secondary | ICD-10-CM

## 2012-05-23 DIAGNOSIS — R229 Localized swelling, mass and lump, unspecified: Secondary | ICD-10-CM

## 2012-05-23 DIAGNOSIS — I7101 Dissection of thoracic aorta: Secondary | ICD-10-CM

## 2012-05-23 DIAGNOSIS — E785 Hyperlipidemia, unspecified: Secondary | ICD-10-CM

## 2012-05-23 DIAGNOSIS — M79609 Pain in unspecified limb: Secondary | ICD-10-CM

## 2012-05-23 DIAGNOSIS — I714 Abdominal aortic aneurysm, without rupture: Secondary | ICD-10-CM

## 2012-05-23 DIAGNOSIS — Z7901 Long term (current) use of anticoagulants: Secondary | ICD-10-CM

## 2012-05-23 DIAGNOSIS — Z952 Presence of prosthetic heart valve: Secondary | ICD-10-CM

## 2012-05-23 DIAGNOSIS — R0989 Other specified symptoms and signs involving the circulatory and respiratory systems: Secondary | ICD-10-CM

## 2012-05-23 NOTE — Assessment & Plan Note (Signed)
Cholesterol is at goal.  Continue current dose of statin and diet Rx.  No myalgias or side effects.  F/U  LFT's in 6 months. Lab Results  Component Value Date   LDLCALC 74 03/14/2012

## 2012-05-23 NOTE — Assessment & Plan Note (Signed)
Doing well outside of brachial plexus issues.  AV resuspension doing well with no significant AR.  Will get chest CT with Abdomen ? In March or April for planning of definitive surgery of AAA

## 2012-05-23 NOTE — Patient Instructions (Signed)
Your physician wants you to follow-up in:  6 MONTHS WITH DR NISHAN  You will receive a reminder letter in the mail two months in advance. If you don't receive a letter, please call our office to schedule the follow-up appointment. Your physician recommends that you continue on your current medications as directed. Please refer to the Current Medication list given to you today. 

## 2012-05-23 NOTE — Telephone Encounter (Signed)
I spoke with Leonette Most on the phone yesterday regarding EMG/nerve conduction studies of the right upper extremity. Unfortunately, it appears as though he has evidence of a severe upper trunk /lateral cord brachial plexopathy on the right. The predominant finding is of axonal compromise. Given his profound atrophy and is markedly abnormal EMG I would like to elicit the input of a neurologist. I've explained to the patient that these types of injuries can be permanent or may take several months to improve. At this point in time I would defer further workup and treatment to the discretion of neurology. She will followup with me when necessary.

## 2012-05-23 NOTE — Assessment & Plan Note (Signed)
3 months post DVT ppt by bedrest and surgery. D/C coumadin Thrombus resolved on duplex

## 2012-05-23 NOTE — Progress Notes (Signed)
Patient ID: [name], male   DOB: Mar 17, 1951, 61 y.o.   MRN: [id number] 61 yo with emergent type A disection repair 9/7by PVT. Post op course complicated by DVT on 3 month course of coumadin. Had pericardial effusion and this is improved on recent echo reviewed from 9/30. No signs of tamponade. Previous smoker but has not restarted since surgery. Surgery 02/19/12 Status post repair of type a ascending dissection with Heaney shield graft used to replace ascending aorta and hemi-arch reconstruction, resuspension of aortic valve Has a residual 6cm infrarenal AAA Since surgery has had paresthesias in 4th and 5th digits on right hand Likely brachial plexus injury with axillary artery cannulation on right side. He had a multitude of questions  Still with tingling in fingers and bicipital weakness There is a family history of aneurysms and with presentation of AAA and thoracic disection I suggested that he should discuss with PVT/Leschber screening his cerebral circulation.  Discussed the resolution of his pericardial effusion and ongoing need to monitor his resuspended AV   Will F/U with Dr Maren Beach and needs timing and game plan for AAA. Given disection I am not sure he would be a candidate for stent grafting but this needs to be looked into  Would continue aggressive risk factor modification with statin.   F/U venous duplex of LLE today showed resolution of thrombus and no DVT on left Will stop coumadin.    ROS: Denies fever, malais, weight loss, blurry vision, decreased visual acuity, cough, sputum, SOB, hemoptysis, pleuritic pain, palpitaitons, heartburn, abdominal pain, melena, lower extremity edema, claudication, or rash.  All other systems reviewed and negative  General: Affect appropriate Healthy:  appears stated age HEENT: normal Neck supple with no adenopathy JVP normal no bruits no thyromegaly Lungs clear with no wheezing and good diaphragmatic motion Heart:  S1/S2 SEM no AR  murmur, no  rub, gallop or click PMI normal Abdomen: benighn, BS positve, no tenderness,  AAA palpable not tender no bruit.  No HSM or HJR Distal pulses intact with no bruits No edema Neuro non-focal Skin warm and dry Left bicipital weakness   Current Outpatient Prescriptions  Medication Sig Dispense Refill  . ALPRAZolam (XANAX) 1 MG tablet TAKE 1 TABLET BY MOUTH AT BEDTIME AS NEEDED  30 tablet  1  . aspirin 81 MG chewable tablet Chew 1 tablet (81 mg total) by mouth daily.      . cholecalciferol (VITAMIN D) 1000 UNITS tablet Take 2,000 Units by mouth daily.      [name] EPINEPHrine (EPI-PEN) 0.3 mg/0.3 mL DEVI Inject 0.3 mg into the muscle as needed. For anaphylaxis       . fexofenadine (ALLEGRA) 180 MG tablet Take 180 mg by mouth daily.        . fluticasone (FLONASE) 50 MCG/ACT nasal spray Place 2 sprays into the nose daily.      [name] HYDROcodone-acetaminophen (NORCO/VICODIN) 5-325 MG per tablet Take 1 tablet by mouth every 4 (four) hours as needed.  30 tablet  0  . lisinopril (PRINIVIL,ZESTRIL) 10 MG tablet Take 5 mg by mouth 2 (two) times daily.      . metoprolol tartrate (LOPRESSOR) 25 MG tablet Take 1 tablet (25 mg total) by mouth 2 (two) times daily.  60 tablet  3  . omega-3 acid ethyl esters (LOVAZA) 1 G capsule Take 1 g by mouth 2 (two) times daily.      . polyethylene glycol (MIRALAX / GLYCOLAX) packet Take 17 g by mouth daily.      [name]  rosuvastatin (CRESTOR) 10 MG tablet Take 10 mg by mouth daily.      [name] tretinoin (RETIN-A) 0.1 % cream Apply 1 application topically daily as needed. For acne or rash      . warfarin (COUMADIN) 5 MG tablet Take 1 tablet (5 mg total) by mouth at bedtime. OR AS DIRECTED.  30 tablet  5  . ferrous fumarate-b12-vitamic C-folic acid (TRINSICON / FOLTRIN) capsule Take 1 capsule by mouth 2 (two) times daily after a meal.  60 capsule  5  . glyBURIDE (DIABETA) 5 MG tablet Take 1 tablet (5 mg total) by mouth daily with breakfast.  30 tablet  1    Allergies  Albuterol and  Testosterone  Electrocardiogram:  04/03/12  SR rate 80 normal ECG  Assessment and Plan

## 2012-05-23 NOTE — Progress Notes (Signed)
Echo not done d/t follow up echo performed in Oct. 2013.

## 2012-05-23 NOTE — Assessment & Plan Note (Signed)
F/U Dr Hart Rochester No abdominal pain  Not clear to me that he may not be a stent graft candidate May depend on any false lumen from more proximal thoracic disection

## 2012-06-09 ENCOUNTER — Other Ambulatory Visit: Payer: Self-pay | Admitting: Internal Medicine

## 2012-06-09 ENCOUNTER — Ambulatory Visit (HOSPITAL_COMMUNITY)
Admission: RE | Admit: 2012-06-09 | Discharge: 2012-06-09 | Disposition: A | Discharge status: Home / Self Care | Payer: 59 | Source: Ambulatory Visit | Attending: Internal Medicine | Admitting: Internal Medicine

## 2012-06-09 ENCOUNTER — Other Ambulatory Visit (INDEPENDENT_AMBULATORY_CARE_PROVIDER_SITE_OTHER): Payer: 59

## 2012-06-09 DIAGNOSIS — M79609 Pain in unspecified limb: Secondary | ICD-10-CM | POA: Insufficient documentation

## 2012-06-09 DIAGNOSIS — M79604 Pain in right leg: Secondary | ICD-10-CM

## 2012-06-09 DIAGNOSIS — M79605 Pain in left leg: Secondary | ICD-10-CM

## 2012-06-09 DIAGNOSIS — G629 Polyneuropathy, unspecified: Secondary | ICD-10-CM

## 2012-06-09 DIAGNOSIS — G589 Mononeuropathy, unspecified: Secondary | ICD-10-CM

## 2012-06-09 LAB — BASIC METABOLIC PANEL WITH GFR
BUN: 20 mg/dL (ref 6–23)
CO2: 25 mEq/L (ref 19–32)
Creat: 0.84 mg/dL (ref 0.50–1.35)
Glucose, Bld: 157 mg/dL — ABNORMAL HIGH (ref 70–99)
Potassium: 3.9 mEq/L (ref 3.5–5.3)
Sodium: 137 mEq/L (ref 135–145)

## 2012-06-09 LAB — VITAMIN B12: Vitamin B-12: 415 pg/mL (ref 211–911)

## 2012-06-09 NOTE — Addendum Note (Signed)
Addended by: Blanch Media A on: 06/09/2012 11:39 AM   Modules accepted: Orders

## 2012-06-09 NOTE — Progress Notes (Signed)
Had post op LE DVT and treated with warfarin 3 months. Now off warfarin about 2 weeks. Legs B are now painful / heavy / and numb - sort of neuropathic pain but that had gotten much better - but new past two days and legs are wobbly. D Dimer unlikely to be helpful as has recently been on warfarin and multiple massive surgeries. Will check dopplers B today.  Also concern for expansion of AAA. Since he has a Physicist, medical, I asked that he call the surgeon's office as they are better suited to know if re-imaging is indicated at this time since their original plan is to reimagine in 6 month.   His PCP is Dr Felicity Coyer but since it is the holidays and a Friday afternoon, he spoke to me and I am comfortable ordering the dopplers and F/U the results. He has called the surgeon's office and has left a message.

## 2012-06-09 NOTE — Progress Notes (Signed)
VASCULAR LAB PRELIMINARY  PRELIMINARY  PRELIMINARY  PRELIMINARY  Bilateral lower extremity venous duplex  completed.    Preliminary report:  Bilateral:  No evidence of DVT, superficial thrombosis, or Baker's Cyst.    Shan Padgett, RVT 06/09/2012, 4:25 PM

## 2012-06-09 NOTE — Progress Notes (Signed)
Ordered wrong doppler. Put in correct order.

## 2012-06-09 NOTE — Progress Notes (Signed)
I was called to the vascular lab as Leonette Most seemed anxious and was talking non-stop and fast. When I arrived the Dopplers were completed and were negative. He was sitting in dark room (eyes feel better) and was calm but understandably concerned about cause of new leg sxs. Heart was nl rate, maybe slight systolic murmur but nothing gross, regular rhythm, Intact sensation LE. HE has spoken to VanTright's office and reassured that hte sxs do not represent worsening of the aneurysm. I reviewed his meds. Will check 1. Vit B12 - has neuropathy and I can't find old level 2. Vit D - on 1000 daily and since fat soluble needs checked 3. BMP - check glucose - stopped the glipizide and will check Ca  If all nl, could just be worsening / changing of neuropathy - new cold weather, is walking outside more???

## 2012-06-16 ENCOUNTER — Encounter: Payer: Self-pay | Admitting: Sports Medicine

## 2012-06-20 ENCOUNTER — Other Ambulatory Visit: Payer: Self-pay | Admitting: Pharmacist

## 2012-06-20 ENCOUNTER — Encounter: Payer: Self-pay | Admitting: Internal Medicine

## 2012-06-27 ENCOUNTER — Encounter: Payer: Self-pay | Admitting: Internal Medicine

## 2012-06-27 ENCOUNTER — Ambulatory Visit (INDEPENDENT_AMBULATORY_CARE_PROVIDER_SITE_OTHER): Payer: 59 | Admitting: Internal Medicine

## 2012-06-27 VITALS — BP 110/60 | HR 72 | Temp 98.2°F | Ht 76.0 in | Wt 229.1 lb

## 2012-06-27 DIAGNOSIS — F411 Generalized anxiety disorder: Secondary | ICD-10-CM

## 2012-06-27 DIAGNOSIS — I824Y9 Acute embolism and thrombosis of unspecified deep veins of unspecified proximal lower extremity: Secondary | ICD-10-CM

## 2012-06-27 DIAGNOSIS — F419 Anxiety disorder, unspecified: Secondary | ICD-10-CM | POA: Insufficient documentation

## 2012-06-27 DIAGNOSIS — R739 Hyperglycemia, unspecified: Secondary | ICD-10-CM

## 2012-06-27 DIAGNOSIS — I7101 Dissection of thoracic aorta: Secondary | ICD-10-CM

## 2012-06-27 DIAGNOSIS — R7309 Other abnormal glucose: Secondary | ICD-10-CM

## 2012-06-27 MED ORDER — ALPRAZOLAM 1 MG PO TABS: 1.0000 mg | ORAL_TABLET | Freq: Every evening | ORAL | Status: DC | PRN

## 2012-06-27 NOTE — Assessment & Plan Note (Signed)
Increase symptoms since 02/2012 medical event - dissection of TAA Overlap with classic depression symptoms -but reluctant to dd addiotnal meds to regimen Agrees to [facility] referral for counseling - will consider between D Gutterman and Nancie Neas and let me know who he prefers

## 2012-06-27 NOTE — Assessment & Plan Note (Signed)
A1c of 7.5 early September 2013 when admitted for aortic dissection Treated with Lantus and sliding scale insulin, also suspect exacerbation related to steroid use in hospital A1c much improved at 6.0 in 3 weeks' time Off glyburide since 03/2012 - Lab Results  Component Value Date   HGBA1C 6.0 03/14/2012

## 2012-06-27 NOTE — Progress Notes (Signed)
Subjective:    Patient ID: [name], male    DOB: [birth year], 62 y.o.   MRN: [id number]  HPI Here for follow up -  Reports increasing symptoms depression and mood labile - associated with anxiety symptoms, fatigue, poor focus, poor appetite and feeling down/depressed - chronic anxiety symptoms, now worse - temporary improvement with prn low dose xanax - reluctant to consider additional meds  Also notes ?occular migraines - no headache - due to see neuro next week for same Also intermittent pain in calf -LLE with exertion - neg DVT on repeat doppler - no back pain or falls/weakness  Also reviewed interval events and medical issues:  TAA - emergent surgical repair of thoracic aortic aneurysm dissection 02/20/12. Postop course complicated by encephalopathy and development of left lower leg DVT s/p 3 months warfarin (DVT felt provoked rFVIIa administration intraoperatively) Hyperglycemia noted during hospitalization - briefly on OHA, DC's due to symptoms of hypoglycemia  possible brachial plexus injury intraoperatively -abnormal appearance of right forearm and numbness of right hand- pending neuro eval for NCS  sleep apnea - sleep study 9/09 with moderate sleep apnea reviewed. However, patient intolerant of CPAP -   dyslipidemia - prev on lovaza, simva and lipitor tx for same- changed from vytorin tabs to crestor 02/2012 -the patient reports compliance with medication(s) as prescribed. Denies adverse side effects.  Past Medical History  Diagnosis Date  . OBSTRUCTIVE SLEEP APNEA 02/2008 sleep study  . MIGRAINE HEADACHE   . ERECTILE DYSFUNCTION, ORGANIC   . Unspecified vitamin D deficiency   . HYPERLIPIDEMIA   . ASTHMA   . ALLERGIC RHINITIS   . Dissecting aortic aneurysm, thoracic 02/20/2012    s/p emergent surgical repair  . Hyperglycemia   . Anxiety     Review of Systems  Respiratory: Negative for cough and shortness of breath.   Cardiovascular: Negative for chest pain or  palpitations.      Objective:   Physical Exam  BP 110/60  Pulse 72  Temp 98.2 F (36.8 C) (Oral)  Ht 6\' 4"  (1.93 m)  Wt 229 lb 1.9 oz (103.928 kg)  BMI 27.89 kg/m2  SpO2 97% Wt Readings from Last 3 Encounters:  06/27/12 229 lb 1.9 oz (103.928 kg)  05/23/12 226 lb 9.6 oz (102.785 kg)  05/04/12 228 lb (103.42 kg)   Constitutional:  He appears well-developed and well-nourished. Mod emotional distress.   Neck: Normal range of motion. Neck supple. No JVD or LAD present. No thyromegaly present.  Cardiovascular: Normal rate, regular rhythm and normal heart sounds.  No murmur heard. no BLE edema Pulmonary/Chest: Effort normal and breath sounds normal. No respiratory distress. no wheezes.  Musculoskeletal: mild atrophy of proximal right forearm muscles, volar surface - but normal range of motion at right elbow right wrist and right fingers. Ligamentous function intact. Neurological: Numbness median distribution of right hand but equal bilateral hand grip strength. he is alert and oriented to person, place, and time. No cranial nerve deficit. Speech fluent, no dysarthria. Coordination and gait are cautious but normal.  Psychiatric: he has a anxious and very dysphoric mood, occassionally tearful affect. behavior is normal. Judgment and thought content normal.    Lab Results  Component Value Date   WBC 7.5 05/15/2012   HGB 13.8 05/15/2012   HCT 41.0 05/15/2012   PLT 211 05/15/2012   CHOL 119 03/14/2012   TRIG 76.0 03/14/2012   HDL 30.20* 03/14/2012   LDLDIRECT 114* 03/21/2010   ALT 55* 03/14/2012   AST  27 03/14/2012   NA 137 06/09/2012   K 3.9 06/09/2012   CL 100 06/09/2012   CREATININE 0.84 06/09/2012   BUN 20 06/09/2012   CO2 25 06/09/2012   TSH 0.95 02/16/2012   PSA 0.47 01/02/2010   INR 2.50 04/24/2012   HGBA1C 6.0 03/14/2012       Assessment & Plan:   see problem list. Medications and labs reviewed today.  Time spent with pt today 25 minutes, greater than 50% time spent counseling  patient on anxiety and depression + medication review. Also review of prior records

## 2012-06-27 NOTE — Patient Instructions (Signed)
It was good to see you today. We have reviewed your interval records today Will refer for counseling - with Caralyn Guile or Nancie Neas for "PTSD", anxiety and depression symptoms -let me know who you prefer Medications reviewed and updated, no changes at this time.

## 2012-06-27 NOTE — Assessment & Plan Note (Signed)
Left lower leg DVT precipitated by recombinant factor VII given intraoperatively s/p 3 months anticoag given provoked occurrence - DC'd 05/21/12 follow up doppler negative  Lab Results  Component Value Date   INR 2.50 04/24/2012   INR 2.10 04/10/2012   INR 1.90* 04/02/2012

## 2012-06-27 NOTE — Assessment & Plan Note (Signed)
Op note and repair reviewed today. Patient appears to be recovering well Congratulations on smoking cessation again provided Continue medical management and followup with cardiothoracic surgeon as planned

## 2012-06-30 ENCOUNTER — Other Ambulatory Visit: Payer: Self-pay | Admitting: Physical Medicine & Rehabilitation

## 2012-06-30 ENCOUNTER — Other Ambulatory Visit: Payer: Self-pay | Admitting: Internal Medicine

## 2012-07-03 ENCOUNTER — Other Ambulatory Visit: Payer: Self-pay | Admitting: *Deleted

## 2012-07-03 ENCOUNTER — Other Ambulatory Visit: Payer: Self-pay | Admitting: Cardiovascular Disease

## 2012-07-03 MED ORDER — LISINOPRIL 10 MG PO TABS: 5.0000 mg | ORAL_TABLET | Freq: Two times a day (BID) | ORAL | Status: DC

## 2012-07-03 MED ORDER — OMEGA-3-ACID ETHYL ESTERS 1 G PO CAPS: 1.0000 g | ORAL_CAPSULE | Freq: Two times a day (BID) | ORAL | Status: DC

## 2012-07-03 NOTE — Telephone Encounter (Signed)
R'cd fax from [facility] for refill of Lovaza.

## 2012-07-05 ENCOUNTER — Other Ambulatory Visit: Payer: Self-pay

## 2012-07-05 ENCOUNTER — Encounter: Payer: Self-pay | Admitting: Cardiothoracic Surgery

## 2012-07-05 ENCOUNTER — Ambulatory Visit (INDEPENDENT_AMBULATORY_CARE_PROVIDER_SITE_OTHER): Payer: 59 | Admitting: Cardiothoracic Surgery

## 2012-07-05 VITALS — BP 130/71 | HR 75 | Resp 20 | Ht 76.0 in | Wt 228.0 lb

## 2012-07-05 DIAGNOSIS — I712 Thoracic aortic aneurysm, without rupture: Secondary | ICD-10-CM

## 2012-07-05 DIAGNOSIS — Z09 Encounter for follow-up examination after completed treatment for conditions other than malignant neoplasm: Secondary | ICD-10-CM

## 2012-07-05 DIAGNOSIS — I7101 Dissection of thoracic aorta: Secondary | ICD-10-CM

## 2012-07-05 NOTE — Progress Notes (Signed)
PCP is Rene Paci, MD Referring Provider is Newt Lukes, MD  Chief Complaint  Patient presents with  . Routine Post Op    10 week f/u S/P Thoracic ascending aneurysm repair on 02/20/12    HPI: Routine followup 3.5 months following emergency repair of a type a thoracic dissection with resuspension of the aortic valve. Last 2-D echocardiogram demonstrates normal LV function, no aortic insufficiency. Patient developed a leg DVT and was on Coumadin postop-this was discontinued in mid December. The patient has a known saccular infrarenal abdominal aneurysm and surgical treatment of this will be performed by Dr. Betti Cruz later this year-a CTA of the abdominal aorta and thoracic aorta will be performed in late April before surgery. The patient denies chest pains or shortness of breath. Walks up to 60 minutes a day. He has stoped smoking. Blood pressure has been under good control. He is participating in cardiac rehabilitation. He is interested in a cardiac surgery patient support group but has not been able to locate such an  Organization.  His biggest complaint has been depression and is planning on starting some therapy for this   Past Medical History  Diagnosis Date  . OBSTRUCTIVE SLEEP APNEA 02/2008 sleep study  . MIGRAINE HEADACHE   . ERECTILE DYSFUNCTION, ORGANIC   . Unspecified vitamin D deficiency   . HYPERLIPIDEMIA   . ASTHMA   . ALLERGIC RHINITIS   . Dissecting aortic aneurysm, thoracic 02/20/2012    s/p emergent surgical repair  . Hyperglycemia   . Anxiety     Past Surgical History  Procedure Date  . Nasal fx without repair   . Thoracic aortic aneurysm repair 02/20/2012    Procedure: THORACIC ASCENDING ANEURYSM REPAIR (AAA);  Surgeon: Kerin Perna, MD;  Location: [facility] OR;  Service: Open Heart Surgery;  Laterality: N/A;    Family History  Problem Relation Age of Onset  . Diabetes Mother   . Heart disease Maternal Uncle   . Heart disease Paternal Uncle   . Heart  disease Cousin   . Lung cancer Cousin     2 cousins    Social History History  Substance Use Topics  . Smoking status: Former Smoker -- 0.1 packs/day  . Smokeless tobacco: Not on file     Comment: 10 cigs/day, quit 02/2012 after TAA emergent repair  . Alcohol Use: Yes     Comment: Rarely    Current Outpatient Prescriptions  Medication Sig Dispense Refill  . ALPRAZolam (XANAX) 1 MG tablet Take 1 tablet (1 mg total) by mouth at bedtime as needed for sleep.  30 tablet  1  . aspirin 81 MG chewable tablet Chew 1 tablet (81 mg total) by mouth daily.      . cholecalciferol (VITAMIN D) 1000 UNITS tablet Take 2,000 Units by mouth daily.      [name] EPINEPHrine (EPI-PEN) 0.3 mg/0.3 mL DEVI Inject 0.3 mg into the muscle as needed. For anaphylaxis       . fexofenadine (ALLEGRA) 180 MG tablet Take 180 mg by mouth daily.        . fluticasone (FLONASE) 50 MCG/ACT nasal spray USE 1 SPRAY IN EACH NOSTRIL AS NEEDED  16 g  2  . HYDROcodone-acetaminophen (NORCO/VICODIN) 5-325 MG per tablet Take 1 tablet by mouth every 4 (four) hours as needed.  30 tablet  0  . lisinopril (PRINIVIL,ZESTRIL) 10 MG tablet Take 0.5 tablets (5 mg total) by mouth 2 (two) times daily.  90 tablet  1  .  metoprolol tartrate (LOPRESSOR) 25 MG tablet Take 1 tablet (25 mg total) by mouth 2 (two) times daily.  60 tablet  3  . omega-3 acid ethyl esters (LOVAZA) 1 G capsule Take 1 capsule (1 g total) by mouth 2 (two) times daily.  180 capsule  2  . rosuvastatin (CRESTOR) 10 MG tablet Take 10 mg by mouth daily.      [name] tretinoin (RETIN-A) 0.1 % cream Apply 1 application topically daily as needed. For acne or rash        Allergies  Allergen Reactions  . Albuterol   . Testosterone Shortness Of Breath    Medication:Androgel Pt states trouble breathing at night    Review of Systems incision is well-healed. Right hand numbness and weakness improved. Migraine headaches improved. Weight has been stable. He has successfully stopped smoking  BP  130/71  Pulse 75  Resp 20  Ht 6\' 4"  (1.93 m)  Wt 228 lb (103.42 kg)  BMI 27.75 kg/m2  SpO2 93% Physical Exam Alert and comfortable Breath sounds clear and equal Pulses 2+ in all extremities Cardiac rhythm regular without murmur No pedal edema Neuro intact, right-hand grip is now strong  Diagnostic Tests: No x-ray studies today, CTA of the thoracic aorta plan April 2014  Impression: Good recovery from emergency repair of type a dissection. Patient is back at work. Discussed exercise limitations at this point. Discussed the importance of blood pressure control and abstinence from smoking. Plan: Return with a CTA of the thoracic aorta approximately 6 months post repair.

## 2012-07-17 ENCOUNTER — Encounter: Payer: Self-pay | Admitting: Internal Medicine

## 2012-07-17 DIAGNOSIS — F419 Anxiety disorder, unspecified: Secondary | ICD-10-CM

## 2012-07-17 DIAGNOSIS — F329 Major depressive disorder, single episode, unspecified: Secondary | ICD-10-CM

## 2012-07-17 DIAGNOSIS — F32A Depression, unspecified: Secondary | ICD-10-CM

## 2012-08-02 ENCOUNTER — Ambulatory Visit (INDEPENDENT_AMBULATORY_CARE_PROVIDER_SITE_OTHER): Payer: 59 | Admitting: Psychology

## 2012-08-02 DIAGNOSIS — F411 Generalized anxiety disorder: Secondary | ICD-10-CM

## 2012-08-03 ENCOUNTER — Telehealth: Payer: Self-pay | Admitting: Cardiovascular Disease

## 2012-08-03 NOTE — Telephone Encounter (Signed)
New problem   Would like a call back today    Mother past away in [place]. Wants to know if it's O.K for him to go . H/O abn aneurysm.

## 2012-08-03 NOTE — Telephone Encounter (Signed)
Pt calling to see if it is okay for him to travel to [place] for his mother's funeral.Pt had an AAA repair with Dr. Donata Clay thoracic surgeon on 07/05/12 and has not been released from his care. Pt's next appointment with the Surgeon is 10/11/12. Left a message to call back.

## 2012-08-04 ENCOUNTER — Telehealth: Payer: Self-pay

## 2012-08-04 NOTE — Telephone Encounter (Signed)
Pt. called office to request clearance to travel by train to [place], [place], for a family funeral. States it will be a 25 hr. train ride.  Questions if Dr. Hart Rochester feels it is safe for him to travel with his Abdominal Aortic Aneurysm?  Denies that he has any abdominal pain or back pain.  Last CTA Abd. And Pelvis was 02/19/2012.  Adv. Dr. Hart Rochester was not in the office this week.  Discussed w/ Dr. Imogene Burn.  Per Dr. Imogene Burn, pt. is no more at risk for AAA rupture, traveling by train, car, or airplane.  Stated there is no guarantee that traveling will put him any more at risk than staying at home.  Spoke with pt.  Given recommendations per Dr. Imogene Burn.  Advised him to stay on his blood pressure medications, and to seek medical attention if he has symptoms of nausea/vomiting, abdominal pain or back pain.  Verb. Understanding.

## 2012-08-04 NOTE — Telephone Encounter (Signed)
Not my call  He had a thoracic aneurysm and still needs surgery for abdominal aneurysm Needs to call PVT;s office.  Heart is stable for travel

## 2012-08-04 NOTE — Telephone Encounter (Signed)
PT AWARE  AS WELL AS DR Eden Emms SPOKE TO PT RE  MESSAGE  THIS NURSE CALLED AND SPOKE WITH DR LAWSON'S NURSE   DR LAWSON'S NURSE WILL REVIEW RECORDS AND DISCUSS WITH ONE OF DR LAWSON'S PARTNERS AS  DR Hart Rochester IS ON VACATION ./[facility] WILL  NOTIFY PT .Zack Seal

## 2012-08-28 ENCOUNTER — Other Ambulatory Visit: Payer: Self-pay | Admitting: Internal Medicine

## 2012-09-20 ENCOUNTER — Ambulatory Visit (INDEPENDENT_AMBULATORY_CARE_PROVIDER_SITE_OTHER): Payer: 59 | Admitting: Psychology

## 2012-09-20 DIAGNOSIS — F411 Generalized anxiety disorder: Secondary | ICD-10-CM

## 2012-10-10 ENCOUNTER — Ambulatory Visit
Admission: RE | Admit: 2012-10-10 | Discharge: 2012-10-10 | Disposition: A | Discharge status: Home / Self Care | Payer: 59 | Source: Ambulatory Visit | Attending: Vascular Surgery | Admitting: Vascular Surgery

## 2012-10-10 ENCOUNTER — Ambulatory Visit: Payer: 59 | Admitting: Vascular Surgery

## 2012-10-10 ENCOUNTER — Inpatient Hospital Stay: Admission: RE | Admit: 2012-10-10 | Payer: 59 | Source: Ambulatory Visit

## 2012-10-10 ENCOUNTER — Other Ambulatory Visit: Payer: 59

## 2012-10-10 ENCOUNTER — Ambulatory Visit
Admission: RE | Admit: 2012-10-10 | Discharge: 2012-10-10 | Disposition: A | Discharge status: Home / Self Care | Payer: 59 | Source: Ambulatory Visit | Attending: Cardiothoracic Surgery | Admitting: Cardiothoracic Surgery

## 2012-10-10 DIAGNOSIS — Z48812 Encounter for surgical aftercare following surgery on the circulatory system: Secondary | ICD-10-CM

## 2012-10-10 DIAGNOSIS — I714 Abdominal aortic aneurysm, without rupture, unspecified: Secondary | ICD-10-CM

## 2012-10-10 DIAGNOSIS — I7101 Dissection of thoracic aorta: Secondary | ICD-10-CM

## 2012-10-10 MED ORDER — IOHEXOL 350 MG/ML SOLN
100.0000 mL | Freq: Once | INTRAVENOUS | Status: AC | PRN
  Administered 2012-10-10: 100 mL via INTRAVENOUS

## 2012-10-11 ENCOUNTER — Other Ambulatory Visit: Payer: Self-pay

## 2012-10-11 ENCOUNTER — Encounter: Payer: Self-pay | Admitting: Cardiothoracic Surgery

## 2012-10-11 ENCOUNTER — Ambulatory Visit (INDEPENDENT_AMBULATORY_CARE_PROVIDER_SITE_OTHER): Payer: 59 | Admitting: Cardiothoracic Surgery

## 2012-10-11 VITALS — BP 125/69 | HR 88 | Resp 16 | Ht 76.0 in | Wt 228.0 lb

## 2012-10-11 DIAGNOSIS — R079 Chest pain, unspecified: Secondary | ICD-10-CM

## 2012-10-11 DIAGNOSIS — I7101 Dissection of thoracic aorta: Secondary | ICD-10-CM

## 2012-10-11 DIAGNOSIS — Z09 Encounter for follow-up examination after completed treatment for conditions other than malignant neoplasm: Secondary | ICD-10-CM

## 2012-10-11 NOTE — Progress Notes (Signed)
PCP is Rene Paci, MD Referring Provider is Pryor Ochoa, MD  Chief Complaint  Patient presents with  . Routine Post Op    3 month with CTA 4/29 per [facility]    HPI: 6 months followup after emergency repair of type I ascending thoracic aortic dissection extending distally to the infrarenal aorta.  The proximal ascending aorta was replaced with a 30 mm graft and the hemi-arch reconstruction of the distal and proximal arch was performed with a 32 mm graft. The aortic valve was resuspended and a postop echo shows no significant AI.  A 6 cm AAA was noted at the time of his dissection and the plan is for repair of that in the near future by Dr. Hart Rochester.  The patient's cardiac status is stable. Although he has coronary calcifications on a CT scan he has no symptoms of angina or ischemic heart disease. His last echo in December demonstrated normal LV function. He has had some heartburn symptoms and a screening myocardial perfusion stress test is planned prior to abdominal aortic aneurysm surgery.  Since his last visit Mr. Lantigua is stop smoking and is walking at least 20 minutes a day. He still has some anxiety issues. His right hand weakness and numbness is improved. He has some memory issues and migraine headaches as well. A postop head CT scan was negative.  Today the CTA of the thoracic aorta shows the ascending aorta and proximal arch repair is intact without pseudoaneurysm. The original false lumen of the type I dissection is mainly thrombosed in the proximal descending thoracic aorta which measures 4.2 cm in diameter slightly increased from the original scan 6 months ago. The false lumen reconstitutes in the mid to distal descending thoracic aorta without significant dilatation of the distal descending thoracic aorta and extends all way to the infrarenal aortic aneurysm . The abdominal aneurysm now is proximally 6.2 cm. It is asymptomatic.  Past Medical History  Diagnosis Date  .  OBSTRUCTIVE SLEEP APNEA 02/2008 sleep study  . MIGRAINE HEADACHE   . ERECTILE DYSFUNCTION, ORGANIC   . Unspecified vitamin D deficiency   . HYPERLIPIDEMIA   . ASTHMA   . ALLERGIC RHINITIS   . Dissecting aortic aneurysm, thoracic 02/20/2012    s/p emergent surgical repair  . Hyperglycemia   . Anxiety     Past Surgical History  Procedure Laterality Date  . Nasal fx without repair    . Thoracic aortic aneurysm repair  02/20/2012    Procedure: THORACIC ASCENDING ANEURYSM REPAIR (AAA);  Surgeon: Kerin Perna, MD;  Location: [facility] OR;  Service: Open Heart Surgery;  Laterality: N/A;    Family History  Problem Relation Age of Onset  . Diabetes Mother   . Heart disease Maternal Uncle   . Heart disease Paternal Uncle   . Heart disease Cousin   . Lung cancer Cousin     2 cousins    Social History History  Substance Use Topics  . Smoking status: Former Smoker -- 0.10 packs/day  . Smokeless tobacco: Not on file     Comment: 10 cigs/day, quit 02/2012 after TAA emergent repair  . Alcohol Use: Yes     Comment: Rarely    Current Outpatient Prescriptions  Medication Sig Dispense Refill  . ALPRAZolam (XANAX) 1 MG tablet Take 1 tablet (1 mg total) by mouth at bedtime as needed for sleep.  30 tablet  1  . aspirin 81 MG chewable tablet Chew 1 tablet (81 mg total) by mouth daily.      [name]  cholecalciferol (VITAMIN D) 1000 UNITS tablet Take 2,000 Units by mouth daily.      [name] EPINEPHrine (EPI-PEN) 0.3 mg/0.3 mL DEVI Inject 0.3 mg into the muscle as needed. For anaphylaxis       . fexofenadine (ALLEGRA) 180 MG tablet Take 180 mg by mouth daily.        . fluticasone (FLONASE) 50 MCG/ACT nasal spray USE 1 SPRAY IN EACH NOSTRIL AS NEEDED  16 g  2  . lisinopril (PRINIVIL,ZESTRIL) 10 MG tablet Take 0.5 tablets (5 mg total) by mouth 2 (two) times daily.  90 tablet  1  . metoprolol tartrate (LOPRESSOR) 25 MG tablet TAKE 1 TABLET BY MOUTH 2 TIMES DAILY.  60 tablet  11  . omega-3 acid ethyl esters (LOVAZA) 1 G  capsule Take 1 capsule (1 g total) by mouth 2 (two) times daily.  180 capsule  2  . rosuvastatin (CRESTOR) 10 MG tablet Take 10 mg by mouth daily.      [name] tretinoin (RETIN-A) 0.1 % cream Apply 1 application topically daily as needed. For acne or rash       No current facility-administered medications for this visit.    Allergies  Allergen Reactions  . Albuterol   . Testosterone Shortness Of Breath    Medication:Androgel Pt states trouble breathing at night    Review of Systems Patient back working full-time Patient is anxious to do some light duty yard work which is fine, lifting restriction is now less than 40 pounds Patient complains of migraine headaches some insomnia some forgetfulness but overall appears to be doing much better. He brings blood pressure record showing excellent blood pressure control. He is completely stop smoking. He is stop taking the Coumadin which he was given for approximately 3 months for postoperative DVT.  BP 125/69  Pulse 88  Resp 16  Ht 6\' 4"  (1.93 m)  Wt 228 lb (103.42 kg)  BMI 27.76 kg/m2  SpO2 98% Physical Exam Alert and pleasant Chest with well-healed sternal incision breath sounds clear Cardiac rhythm regular soft flow murmur through aortic valve, no diastolic murmur of AI Strong pulses in all extremities no peripheral edema Neurologic exam without focal motor deficit  Diagnostic Tests: CTA thoracic and abdominal aorta reviewed with patient showing intact repair of the ascending aorta and proximal arch with widely patent arch vessels, thrombosed false lumen of the proximal descending thoracic aorta but with the diameter slightly increased from 3.8-4.2 cm, persistent false lumen in mid to distal descending thoracic aorta extending to the AAA, slight enlargement of the AAA from 6.0-6.2 cm  Impression: Proximal dissection aortic repair is intact and looks good Proximal descending thoracic aorta is slightly dilated 4.2 cm but with thrombosed  false lumen False lumen is reestablished in mid to distal descending thoracic aorta extends to the 6.2 cm AAA  Plan: Assess for coronary disease with a stress Cardiolite scan prior to abdominal AAA repair  Repeat CT at thoracic aorta 6 months to followup size of descending thoracic aorta

## 2012-10-16 ENCOUNTER — Telehealth: Payer: Self-pay | Admitting: Cardiovascular Disease

## 2012-10-16 ENCOUNTER — Encounter: Payer: Self-pay | Admitting: Vascular Surgery

## 2012-10-16 NOTE — Telephone Encounter (Signed)
Revonda Standard from Dr. Zenaida Niece Trigt's office called to inform us that patient wanted to reschedule stress cardiolyte at [facility] instead of here at [facility].  I informed her that the PA on call at the hospital would provide care for this procedure.

## 2012-10-16 NOTE — Telephone Encounter (Signed)
New Prob    Scheduled for stress cardiolite for this coming Thursday 5/8, Dr. Eden Emms or PA will have to do it per radiology. Would like to speak to nurse regarding this. Pt has requested for it to be done at [facility].

## 2012-10-17 ENCOUNTER — Encounter: Payer: Self-pay | Admitting: Vascular Surgery

## 2012-10-17 ENCOUNTER — Ambulatory Visit (INDEPENDENT_AMBULATORY_CARE_PROVIDER_SITE_OTHER): Payer: 59 | Admitting: Vascular Surgery

## 2012-10-17 VITALS — BP 120/73 | HR 85 | Resp 18 | Ht 76.0 in | Wt 226.0 lb

## 2012-10-17 DIAGNOSIS — I714 Abdominal aortic aneurysm, without rupture: Secondary | ICD-10-CM

## 2012-10-17 NOTE — Progress Notes (Signed)
Subjective:     Patient ID: [name], male   DOB: 1951-02-01, 62 y.o.   MRN: [id number]  HPI this 62 year old male status post repair of a type A aortic dissection by Dr. Morton Peters in September of 2013. He was also found to have a 6.5 cm infrarenal abdominal aortic aneurysm. The dissection extended down into the infrarenal aneurysm and into the left renal artery. Patient has recovered nicely from the aortic surgery and now returns for discussion regarding his infrarenal aortic aneurysm. He had a CT angiogram performed last week which I have reviewed with multiple colleagues. He does not appear to be a candidate for aortic stent grafting. He does have a true and false lumen extending below the renal arteries and the left renal artery was perfused through both the true and false lumen. The right renal artery is perfused through the true lumen reservoir measures approximately 6.7 cm in maximum diameter. Patient has had some burning discomfort in his chest and some numbness and burning in the lower extremities and is scheduled for a nuclear stress test later this week.  Past Medical History  Diagnosis Date  . OBSTRUCTIVE SLEEP APNEA 02/2008 sleep study  . MIGRAINE HEADACHE   . ERECTILE DYSFUNCTION, ORGANIC   . Unspecified vitamin D deficiency   . HYPERLIPIDEMIA   . ASTHMA   . ALLERGIC RHINITIS   . Dissecting aortic aneurysm, thoracic 02/20/2012    s/p emergent surgical repair  . Hyperglycemia   . Anxiety     History  Substance Use Topics  . Smoking status: Former Smoker -- 0.10 packs/day    Quit date: 02/18/2012  . Smokeless tobacco: Never Used     Comment: 10 cigs/day, quit 02/2012 after TAA emergent repair  . Alcohol Use: Yes     Comment: Rarely    Family History  Problem Relation Age of Onset  . Diabetes Mother   . Heart disease Maternal Uncle   . Heart disease Paternal Uncle   . Heart disease Cousin   . Lung cancer Cousin     2 cousins    Allergies  Allergen Reactions   . Albuterol   . Testosterone Shortness Of Breath    Medication:Androgel Pt states trouble breathing at night    Current outpatient prescriptions:ALPRAZolam (XANAX) 1 MG tablet, Take 1 tablet (1 mg total) by mouth at bedtime as needed for sleep., Disp: 30 tablet, Rfl: 1;  aspirin 81 MG chewable tablet, Chew 1 tablet (81 mg total) by mouth daily., Disp: , Rfl: ;  cholecalciferol (VITAMIN D) 1000 UNITS tablet, Take 2,000 Units by mouth daily., Disp: , Rfl:  EPINEPHrine (EPI-PEN) 0.3 mg/0.3 mL DEVI, Inject 0.3 mg into the muscle as needed. For anaphylaxis , Disp: , Rfl: ;  fexofenadine (ALLEGRA) 180 MG tablet, Take 180 mg by mouth daily.  , Disp: , Rfl: ;  fluticasone (FLONASE) 50 MCG/ACT nasal spray, USE 1 SPRAY IN EACH NOSTRIL AS NEEDED, Disp: 16 g, Rfl: 2;  lisinopril (PRINIVIL,ZESTRIL) 10 MG tablet, Take 0.5 tablets (5 mg total) by mouth 2 (two) times daily., Disp: 90 tablet, Rfl: 1 metoprolol tartrate (LOPRESSOR) 25 MG tablet, TAKE 1 TABLET BY MOUTH 2 TIMES DAILY., Disp: 60 tablet, Rfl: 11;  omega-3 acid ethyl esters (LOVAZA) 1 G capsule, Take 1 capsule (1 g total) by mouth 2 (two) times daily., Disp: 180 capsule, Rfl: 2;  rosuvastatin (CRESTOR) 10 MG tablet, Take 10 mg by mouth daily., Disp: , Rfl: ;  tretinoin (RETIN-A) 0.1 % cream, Apply  1 application topically daily as needed. For acne or rash, Disp: , Rfl:   BP 120/73  Pulse 85  Resp 18  Ht 6\' 4"  (1.93 m)  Wt 226 lb (102.513 kg)  BMI 27.52 kg/m2  Body mass index is 27.52 kg/(m^2).          Review of Systems complains of burning in the chest, migraine headaches, visual visual disturbances, dyspnea on exertion, history of DVT in the left leg which is now resolved, varicose veins, numbness in the legs, occasional dizziness. Other systems negative complete review of systems     Objective:   Physical Exam blood pressure 120/73 heart rate 85 respirations 18 Gen.-alert and oriented x3 in no apparent distress HEENT normal for  age Lungs no rhonchi or wheezing Cardiovascular regular rhythm no murmurs carotid pulses 3+ palpable no bruits audible Abdomen soft nontender -6-7 cm pulsatile mass-nontender  Musculoskeletal free of  major deformities Skin clear -no rashes Neurologic normal Lower extremities 3+ femoral and dorsalis pedis pulses palpable bilaterally with no edema      Assessment:     Large infrarenal abdominal aortic aneurysm-status post repair of type A aortic dissection in September 2013 Left kidney perfused through both true and false lumen  I discussed the potential risks with the patient and his daughter including loss of the left kidney because of thrombosis due to dissection into the distal vessel. They understand this risk and would like to proceed in the near future    Plan:     Check results of nuclear stress test to be performed later this week If this is okay we'll proceed on Wednesday, May 21 with resection and grafting of infrarenal abdominal aortic aneurysm with possible left renal artery bypass risks and benefits thoroughly discussed the patient would like to proceed

## 2012-10-19 ENCOUNTER — Encounter (HOSPITAL_COMMUNITY)
Admission: RE | Admit: 2012-10-19 | Discharge: 2012-10-19 | Disposition: A | Discharge status: Home / Self Care | Payer: 59 | Source: Ambulatory Visit | Attending: Cardiothoracic Surgery | Admitting: Cardiothoracic Surgery

## 2012-10-19 ENCOUNTER — Other Ambulatory Visit: Payer: Self-pay

## 2012-10-19 DIAGNOSIS — R079 Chest pain, unspecified: Secondary | ICD-10-CM

## 2012-10-19 MED ORDER — REGADENOSON 0.4 MG/5ML IV SOLN
INTRAVENOUS | Status: AC
  Filled 2012-10-19: qty 5

## 2012-10-19 MED ORDER — TECHNETIUM TC 99M SESTAMIBI GENERIC - CARDIOLITE
30.0000 | Freq: Once | INTRAVENOUS | Status: AC | PRN
  Administered 2012-10-19: 30 via INTRAVENOUS

## 2012-10-19 MED ORDER — TECHNETIUM TC 99M SESTAMIBI GENERIC - CARDIOLITE
10.0000 | Freq: Once | INTRAVENOUS | Status: AC | PRN
  Administered 2012-10-19: 10 via INTRAVENOUS

## 2012-10-19 MED ORDER — REGADENOSON 0.4 MG/5ML IV SOLN
0.4000 mg | Freq: Once | INTRAVENOUS | Status: AC
  Administered 2012-10-19: 0.4 mg via INTRAVENOUS

## 2012-10-24 ENCOUNTER — Encounter (HOSPITAL_COMMUNITY): Payer: Self-pay | Admitting: Pharmacy Technician

## 2012-10-25 ENCOUNTER — Encounter: Payer: Self-pay | Admitting: Cardiothoracic Surgery

## 2012-10-25 ENCOUNTER — Ambulatory Visit (INDEPENDENT_AMBULATORY_CARE_PROVIDER_SITE_OTHER): Payer: 59 | Admitting: Cardiothoracic Surgery

## 2012-10-25 VITALS — BP 118/69 | HR 76 | Resp 20 | Ht 76.0 in | Wt 224.0 lb

## 2012-10-25 DIAGNOSIS — I7101 Dissection of thoracic aorta: Secondary | ICD-10-CM

## 2012-10-25 DIAGNOSIS — R079 Chest pain, unspecified: Secondary | ICD-10-CM

## 2012-10-25 NOTE — Progress Notes (Signed)
PCP is Rene Paci, MD Referring Provider is Newt Lukes, MD  Chief Complaint  Patient presents with  . Routine Post Op    discuss Cardiolite scan 10/19/12, S/P repair of type I ascending thoracic aortic dissection 02/20/12  . Follow-up    chest pain    HPI: Patient presents for cardiac clearance for pending open repair of AAA may 21st Diagnosis of 6.2 cm infrarenal AAA made at time of repair of type A dissection of ascending thoracic aorta fall 2013 He is recovered from replacement of ascending aorta and hemi-arch repair, CT shows repair intact and some thrombosis of the distal false lumen with the diameter of the descending thoracic aorta proximally 4.5 cm Patient has some intermittent burning chest pain, probably reflux A stress Myoview perfusion scan was performed showing normal myocardial perfusion, normal systolic function. No evidence of coronary artery disease  Patient is cleared for surgery to repair his aneurysm which will be optimally handled with an elective repair-he understands that holding off on surgery would expose him to a risk of rupture and emergency repair with significant higher risk of poor outcome   Past Medical History  Diagnosis Date  . OBSTRUCTIVE SLEEP APNEA 02/2008 sleep study  . MIGRAINE HEADACHE   . ERECTILE DYSFUNCTION, ORGANIC   . Unspecified vitamin D deficiency   . HYPERLIPIDEMIA   . ASTHMA   . ALLERGIC RHINITIS   . Dissecting aortic aneurysm, thoracic 02/20/2012    s/p emergent surgical repair  . Hyperglycemia   . Anxiety     Past Surgical History  Procedure Laterality Date  . Nasal fx without repair    . Thoracic aortic aneurysm repair  02/20/2012    Procedure: THORACIC ASCENDING ANEURYSM REPAIR (AAA);  Surgeon: Kerin Perna, MD;  Location: [facility] OR;  Service: Open Heart Surgery;  Laterality: N/A;    Family History  Problem Relation Age of Onset  . Diabetes Mother   . Heart disease Maternal Uncle   . Heart disease Paternal Uncle    . Heart disease Cousin   . Lung cancer Cousin     2 cousins    Social History History  Substance Use Topics  . Smoking status: Former Smoker -- 0.10 packs/day    Quit date: 02/18/2012  . Smokeless tobacco: Never Used     Comment: 10 cigs/day, quit 02/2012 after TAA emergent repair  . Alcohol Use: Yes     Comment: Rarely    Current Outpatient Prescriptions  Medication Sig Dispense Refill  . ALPRAZolam (XANAX) 1 MG tablet Take 0.25 mg by mouth 4 (four) times daily as needed for sleep or anxiety.      [name] aspirin EC 81 MG tablet Take 81 mg by mouth daily with lunch.      . cholecalciferol (VITAMIN D) 1000 UNITS tablet Take 2,000 Units by mouth daily.      [name] EPINEPHrine (EPI-PEN) 0.3 mg/0.3 mL DEVI Inject 0.3 mg into the muscle as needed (for anaphylaxis).       . fexofenadine (ALLEGRA) 180 MG tablet Take 90 mg by mouth 2 (two) times daily.       . fluticasone (FLONASE) 50 MCG/ACT nasal spray Place 2 sprays into the nose daily as needed for allergies.      [name] lisinopril (PRINIVIL,ZESTRIL) 5 MG tablet Take 5 mg by mouth 2 (two) times daily.      . metoprolol tartrate (LOPRESSOR) 25 MG tablet Take 25 mg by mouth 2 (two) times daily. At noon and at  bedtime (after 11pm)      . montelukast (SINGULAIR) 10 MG tablet Take 5 mg by mouth 2 (two) times daily.      . Multiple Vitamin (MULTIVITAMIN WITH MINERALS) TABS Take 1 tablet by mouth daily.      [name] omega-3 acid ethyl esters (LOVAZA) 1 G capsule Take 2 g by mouth every evening.      [name] OVER THE COUNTER MEDICATION Apply 1 application topically daily as needed (for acne). Oxy 10 vanishing cream      . polyethylene glycol (MIRALAX / GLYCOLAX) packet Take 17 g by mouth daily.      . predniSONE (DELTASONE) 10 MG tablet Take 2.5 mg by mouth 2 (two) times daily as needed (for shortness of breath and vertigo from allergies).      . rosuvastatin (CRESTOR) 10 MG tablet Take 10 mg by mouth at bedtime.      . thiamine (VITAMIN B-1) 100 MG tablet Take 100 mg by  mouth every evening.      . vitamin B-12 (CYANOCOBALAMIN) 500 MCG tablet Take 500-1,000 mcg by mouth 3 (three) times a week. Random days       No current facility-administered medications for this visit.    Allergies  Allergen Reactions  . Albuterol Other (See Comments)    Panic attack  . Testosterone Shortness Of Breath    Medication:Androgel Pt states trouble breathing at night  . Codeine Nausea And Vomiting    Review of Systems The patient will take his beta blocker and Xanax on the morning of surgery. He'll stop his fish oil tablets to reduce risk of bleeding. BP 118/69  Pulse 76  Resp 20  Ht 6\' 4"  (1.93 m)  Wt 224 lb (101.606 kg)  BMI 27.28 kg/m2  SpO2 98% Physical Exam Alert and comfortable Lungs clear Heart rate regular without murmur Abdomen soft nontender Minimal peripheral edema  Diagnostic Tests: Myocardial perfusion scan reviewed with patient, normal perfusion normal systolic function  Impression: Cleared for open repair AAA by Dr. Hart Rochester may 21  Plan: Will followup later this year with CTA of thoracic aorta to follow the diameter of the descending thoracic aorta

## 2012-10-27 ENCOUNTER — Encounter (HOSPITAL_COMMUNITY)
Admission: RE | Admit: 2012-10-27 | Discharge: 2012-10-27 | Disposition: A | Discharge status: Home / Self Care | Payer: 59 | Source: Ambulatory Visit | Attending: Vascular Surgery | Admitting: Vascular Surgery

## 2012-10-27 ENCOUNTER — Encounter (HOSPITAL_COMMUNITY): Payer: Self-pay

## 2012-10-27 DIAGNOSIS — I71019 Dissection of thoracic aorta, unspecified: Secondary | ICD-10-CM | POA: Insufficient documentation

## 2012-10-27 DIAGNOSIS — J45909 Unspecified asthma, uncomplicated: Secondary | ICD-10-CM | POA: Insufficient documentation

## 2012-10-27 DIAGNOSIS — Z01812 Encounter for preprocedural laboratory examination: Secondary | ICD-10-CM | POA: Insufficient documentation

## 2012-10-27 DIAGNOSIS — Z01818 Encounter for other preprocedural examination: Secondary | ICD-10-CM | POA: Insufficient documentation

## 2012-10-27 DIAGNOSIS — I7101 Dissection of thoracic aorta: Secondary | ICD-10-CM | POA: Insufficient documentation

## 2012-10-27 DIAGNOSIS — E785 Hyperlipidemia, unspecified: Secondary | ICD-10-CM | POA: Insufficient documentation

## 2012-10-27 HISTORY — DX: Chronic obstructive pulmonary disease, unspecified: J44.9

## 2012-10-27 HISTORY — DX: Unspecified osteoarthritis, unspecified site: M19.90

## 2012-10-27 HISTORY — DX: Anemia, unspecified: D64.9

## 2012-10-27 HISTORY — DX: Essential (primary) hypertension: I10

## 2012-10-27 HISTORY — DX: Gastro-esophageal reflux disease without esophagitis: K21.9

## 2012-10-27 HISTORY — DX: Acute embolism and thrombosis of unspecified deep veins of unspecified lower extremity: I82.409

## 2012-10-27 LAB — URINALYSIS, ROUTINE W REFLEX MICROSCOPIC
Nitrite: NEGATIVE
Specific Gravity, Urine: 1.031 — ABNORMAL HIGH (ref 1.005–1.030)
Urobilinogen, UA: 1 mg/dL (ref 0.0–1.0)

## 2012-10-27 LAB — BLOOD GAS, ARTERIAL
Bicarbonate: 23.8 mEq/L (ref 20.0–24.0)
TCO2: 24.9 mmol/L (ref 0–100)
pCO2 arterial: 35.3 mmHg (ref 35.0–45.0)
pH, Arterial: 7.444 (ref 7.350–7.450)
pO2, Arterial: 86.6 mmHg (ref 80.0–100.0)

## 2012-10-27 LAB — CBC
HCT: 42 % (ref 39.0–52.0)
Hemoglobin: 14.3 g/dL (ref 13.0–17.0)
RBC: 4.68 MIL/uL (ref 4.22–5.81)
RDW: 13.9 % (ref 11.5–15.5)
WBC: 7.2 10*3/uL (ref 4.0–10.5)

## 2012-10-27 LAB — COMPREHENSIVE METABOLIC PANEL
Albumin: 3.8 g/dL (ref 3.5–5.2)
Alkaline Phosphatase: 40 U/L (ref 39–117)
BUN: 23 mg/dL (ref 6–23)
Chloride: 107 mEq/L (ref 96–112)
Potassium: 4.3 mEq/L (ref 3.5–5.1)
Total Bilirubin: 0.9 mg/dL (ref 0.3–1.2)

## 2012-10-27 LAB — PROTIME-INR: Prothrombin Time: 14.1 seconds (ref 11.6–15.2)

## 2012-10-27 NOTE — Pre-Procedure Instructions (Signed)
KELLEY KNOTH  10/27/2012   Your procedure is scheduled on:  Wednesday, May 21st  Report to [facility] at 0530 AM.  Call this number if you have problems the morning of surgery: [phone]   Remember:   Do not eat food or drink liquids after midnight.    Take these medicines the morning of surgery with A SIP OF WATER: xanax if needed, flonase, lopressor   Do not wear jewelry.  Do not wear lotions, powders, or perfumes, deodorant.  Do not shave 48 hours prior to surgery. Men may shave face and neck.  Do not bring valuables to the hospital.  Contacts, dentures or bridgework may not be worn into surgery.  Leave suitcase in the car. After surgery it may be brought to your room.  For patients admitted to the hospital, checkout time is 11:00 AM the day of discharge.   Patients discharged the day of surgery will not be allowed to drive home.    Special Instructions: Shower using CHG 2 nights before surgery and the night before surgery.  If you shower the day of surgery use CHG.  Use special wash - you have one bottle of CHG for all showers.  You should use approximately 1/3 of the bottle for each shower.   Please read over the following fact sheets that you were given: Pain Booklet, Coughing and Deep Breathing, Blood Transfusion Information, MRSA Information and Surgical Site Infection Prevention

## 2012-10-27 NOTE — Pre-Procedure Instructions (Signed)
[name]  10/27/2012   Your procedure is scheduled on:  11/01/2012, [facility]  Report to [facility] at 6:30 AM.  Enter through entrance A- at [facility] & tell the greeting staff that you are for surgery .  Call this number if you have problems the morning of surgery: [phone]   Remember:   Do not eat food or drink liquids after midnight.   TUESDAY   Take these medicines the morning of surgery with A SIP OF WATER: alprazolam, Tums, allegra, nasal spray, metoprolol, singulair    Do not wear jewelry  Do not wear lotions, powders, or perfumes. You may wear deodorant.  . Men may shave face and neck.  Do not bring valuables to the hospital.  Contacts, dentures or bridgework may not be worn into surgery.  Leave suitcase in the car. After surgery it may be brought to your room.  For patients admitted to the hospital, checkout time is 11:00 AM the day of  discharge.   Patients discharged the day of surgery will not be allowed to drive  home.  Name and phone number of your driver: with family  Special Instructions: Shower using CHG 2 nights before surgery and the night before surgery.  If you shower the day of surgery use CHG.  Use special wash - you have one bottle of CHG for all showers.  You should use approximately 1/3 of the bottle for each shower.   Please read over the following fact sheets that you were given: Pain Booklet, Coughing and Deep Breathing, Blood Transfusion Information, MRSA Information and Surgical Site Infection Prevention

## 2012-10-27 NOTE — Progress Notes (Signed)
Report to Dr. Edilia Bo regarding pt. PTT result this afternoon.  He will call his office have an order put in for repeat on that lab.

## 2012-10-27 NOTE — Progress Notes (Signed)
Call from the lab with alert on PTT.  Page put out to Dr. Edilia Bo for on call report

## 2012-10-27 NOTE — Progress Notes (Signed)
Please see attached sleep apnea tool screening.  Pt. Reports that he has a machine & has not been tolerant of it .

## 2012-10-30 ENCOUNTER — Other Ambulatory Visit: Payer: Self-pay

## 2012-10-30 ENCOUNTER — Telehealth: Payer: Self-pay

## 2012-10-30 ENCOUNTER — Other Ambulatory Visit (HOSPITAL_COMMUNITY): Payer: 59

## 2012-10-30 NOTE — Telephone Encounter (Signed)
Message copied by Phillips Odor on Mon Oct 30, 2012 10:59 AM ------      Message from: Chuck Hint      Created: Fri Oct 27, 2012  5:22 PM      Regarding: abnormal lab       This pt is scheduled for a AAA repair next Wednesday. Out pt called me Friday to report a PTT> 200. He had already left so this could not be repeated. This will need to be repeated Monday and if it is abnormal, please notify Dr. Hart Rochester      Thanks      CD ------

## 2012-10-30 NOTE — Telephone Encounter (Signed)
Rec'd verbal order from Dr. Hart Rochester to repeat PTT today, due to abnormal result of >200 on 10/27/12.  Called pt. at both work # and home #;  Left message to return call to office regarding scheduling repeat labwork.

## 2012-10-30 NOTE — Telephone Encounter (Signed)
Contacted pt. Per phone and scheduled pt. To go to Pre-admission at the [facility] for PTT today.  Agreed with plan.

## 2012-10-30 NOTE — Progress Notes (Signed)
Anesthesia chart review: Patient is a 62 year old male scheduled for open repair of AAA, possible left renal artery bypass by Dr. Hart Rochester on 11/01/2012. He is status is emergent repair of type A thoracic aortic dissection with replacement of the ascending aorta and hemi-arch reconstruction using a Dacron graft by Dr. Donata Clay on 02/20/12.  Additional history includes post-operative LLE DVT 03/01/12 (cleared by follow-up duplex 05/2012), OSA, migraines, ED, HLD, asthma, anxiety, former smoker, hyperglycemia without diagnosis of DM.  He was seen by CT surgeon Dr. Donata Clay and cleared for surgery from his standpoint.  He is scheduled for a CTA of the thoracic aorta later this year to follow the diameter of the descending thoracic aorta.  He has also been evaluated by cardiologist Dr. Eden Emms.  In his note from 05/23/12, he mentions knowing of plans for AAA repair around April of 2014.  Nuclear stress test on 10/19/12 showed: This is interpreted as a normal Lexiscan Myoview study. There is no evidence of ischemia. There is normal left ventricular systolic function, EF 67%.  Echo on 04/13/12 showed: Normal LV size with mild LV hypertrophy. EF 60-65% with septal bounce that appears consistent with prior chest surgery. Normal RV size and systolic function. Trivial MR.  EKG on 04/02/12 showed NSR, borderline RAD.  No evidence of BLE DVT by follow-up duplex on 06/09/12.  CXR on 04/12/12 showed no acute cardiopulmonary abnormality.  Preoperative labs noted.  PTT was > 200.  He got a repeat PTT today that was WNL. Plan for a CBG on arrival due to hyperglycemia history.  Glucose was 166 on 10/27/12.  Patient has been cleared by CT surgery and had a recent low risk stress test.  His DVT was cleared by follow-up duplex in December.  If no acute changes, then I would anticipate that he could proceed as planned.  Velna Ochs [facility]/Anesthesiology Phone [phone] 10/30/2012 2:41  PM

## 2012-10-31 MED ORDER — CEFUROXIME SODIUM 1.5 G IJ SOLR
1.5000 g | INTRAMUSCULAR | Status: AC
  Administered 2012-11-01: 1.5 g via INTRAVENOUS
  Filled 2012-10-31: qty 1.5

## 2012-11-01 ENCOUNTER — Encounter (HOSPITAL_COMMUNITY)
Admission: RE | Disposition: A | Discharge status: Home / Self Care | Payer: Self-pay | Source: Ambulatory Visit | Attending: Vascular Surgery

## 2012-11-01 ENCOUNTER — Encounter (HOSPITAL_COMMUNITY): Payer: 59 | Admitting: Certified Registered"

## 2012-11-01 ENCOUNTER — Inpatient Hospital Stay (HOSPITAL_COMMUNITY): Payer: 59

## 2012-11-01 ENCOUNTER — Inpatient Hospital Stay (HOSPITAL_COMMUNITY)
Admission: RE | Admit: 2012-11-01 | Discharge: 2012-11-07 | DRG: 238 | Disposition: A | Discharge status: Home / Self Care | Payer: 59 | Source: Ambulatory Visit | Attending: Vascular Surgery | Admitting: Vascular Surgery

## 2012-11-01 ENCOUNTER — Encounter (HOSPITAL_COMMUNITY): Payer: Self-pay | Admitting: Vascular Surgery

## 2012-11-01 ENCOUNTER — Encounter (HOSPITAL_COMMUNITY): Payer: Self-pay | Admitting: *Deleted

## 2012-11-01 DIAGNOSIS — J449 Chronic obstructive pulmonary disease, unspecified: Secondary | ICD-10-CM | POA: Diagnosis present

## 2012-11-01 DIAGNOSIS — F411 Generalized anxiety disorder: Secondary | ICD-10-CM | POA: Diagnosis present

## 2012-11-01 DIAGNOSIS — G4733 Obstructive sleep apnea (adult) (pediatric): Secondary | ICD-10-CM | POA: Diagnosis present

## 2012-11-01 DIAGNOSIS — E785 Hyperlipidemia, unspecified: Secondary | ICD-10-CM | POA: Diagnosis present

## 2012-11-01 DIAGNOSIS — I714 Abdominal aortic aneurysm, without rupture, unspecified: Principal | ICD-10-CM | POA: Diagnosis present

## 2012-11-01 DIAGNOSIS — Z86718 Personal history of other venous thrombosis and embolism: Secondary | ICD-10-CM

## 2012-11-01 DIAGNOSIS — I8390 Asymptomatic varicose veins of unspecified lower extremity: Secondary | ICD-10-CM | POA: Diagnosis present

## 2012-11-01 DIAGNOSIS — G43909 Migraine, unspecified, not intractable, without status migrainosus: Secondary | ICD-10-CM | POA: Diagnosis present

## 2012-11-01 DIAGNOSIS — E559 Vitamin D deficiency, unspecified: Secondary | ICD-10-CM | POA: Diagnosis present

## 2012-11-01 DIAGNOSIS — Z79899 Other long term (current) drug therapy: Secondary | ICD-10-CM

## 2012-11-01 DIAGNOSIS — M19049 Primary osteoarthritis, unspecified hand: Secondary | ICD-10-CM | POA: Diagnosis present

## 2012-11-01 DIAGNOSIS — R209 Unspecified disturbances of skin sensation: Secondary | ICD-10-CM | POA: Diagnosis present

## 2012-11-01 DIAGNOSIS — K219 Gastro-esophageal reflux disease without esophagitis: Secondary | ICD-10-CM | POA: Diagnosis present

## 2012-11-01 DIAGNOSIS — J4489 Other specified chronic obstructive pulmonary disease: Secondary | ICD-10-CM | POA: Diagnosis present

## 2012-11-01 DIAGNOSIS — N529 Male erectile dysfunction, unspecified: Secondary | ICD-10-CM | POA: Diagnosis present

## 2012-11-01 DIAGNOSIS — R110 Nausea: Secondary | ICD-10-CM | POA: Diagnosis not present

## 2012-11-01 DIAGNOSIS — R42 Dizziness and giddiness: Secondary | ICD-10-CM | POA: Diagnosis present

## 2012-11-01 DIAGNOSIS — I10 Essential (primary) hypertension: Secondary | ICD-10-CM | POA: Diagnosis present

## 2012-11-01 DIAGNOSIS — Z87891 Personal history of nicotine dependence: Secondary | ICD-10-CM

## 2012-11-01 HISTORY — PX: ABDOMINAL AORTIC ANEURYSM REPAIR: SHX42

## 2012-11-01 LAB — BASIC METABOLIC PANEL
BUN: 15 mg/dL (ref 6–23)
CO2: 22 mEq/L (ref 19–32)
Calcium: 7.6 mg/dL — ABNORMAL LOW (ref 8.4–10.5)
Chloride: 105 mEq/L (ref 96–112)
Creatinine, Ser: 0.68 mg/dL (ref 0.50–1.35)
GFR calc Af Amer: >90 mL/min (ref >90)

## 2012-11-01 LAB — POCT I-STAT 3, ART BLOOD GAS (G3+)
O2 Saturation: 94 %
Patient temperature: 36.2
TCO2: 26 mmol/L (ref 0–100)
pH, Arterial: 7.36 (ref 7.350–7.450)

## 2012-11-01 LAB — POCT I-STAT 7, (LYTES, BLD GAS, ICA,H+H)
Bicarbonate: 25.1 mEq/L — ABNORMAL HIGH (ref 20.0–24.0)
Bicarbonate: 25.8 mEq/L — ABNORMAL HIGH (ref 20.0–24.0)
HCT: 34 % — ABNORMAL LOW (ref 39.0–52.0)
O2 Saturation: 100 %
Patient temperature: 35.5
Sodium: 138 mEq/L (ref 135–145)
TCO2: 26 mmol/L (ref 0–100)
TCO2: 27 mmol/L (ref 0–100)
pCO2 arterial: 43 mmHg (ref 35.0–45.0)
pH, Arterial: 7.379 (ref 7.350–7.450)
pO2, Arterial: 422 mmHg — ABNORMAL HIGH (ref 80.0–100.0)
pO2, Arterial: 440 mmHg — ABNORMAL HIGH (ref 80.0–100.0)

## 2012-11-01 LAB — CBC
HCT: 37.3 % — ABNORMAL LOW (ref 39.0–52.0)
MCH: 30.7 pg (ref 26.0–34.0)
MCV: 90.1 fL (ref 78.0–100.0)
Platelets: 123 10*3/uL — ABNORMAL LOW (ref 150–400)
RDW: 13.9 % (ref 11.5–15.5)
WBC: 15.3 10*3/uL — ABNORMAL HIGH (ref 4.0–10.5)

## 2012-11-01 LAB — PROTIME-INR: Prothrombin Time: 14.9 seconds (ref 11.6–15.2)

## 2012-11-01 LAB — GLUCOSE, CAPILLARY: Glucose-Capillary: 134 mg/dL — ABNORMAL HIGH (ref 70–99)

## 2012-11-01 LAB — MAGNESIUM: Magnesium: 1.8 mg/dL (ref 1.5–2.5)

## 2012-11-01 SURGERY — ANEURYSM ABDOMINAL AORTIC REPAIR
Anesthesia: General | Site: Abdomen | Wound class: Clean

## 2012-11-01 MED ORDER — SODIUM CHLORIDE 0.9 % IV SOLN
INTRAVENOUS | Status: DC | PRN
  Administered 2012-11-01: 12:00:00 via INTRAVENOUS

## 2012-11-01 MED ORDER — ENOXAPARIN SODIUM 40 MG/0.4ML ~~LOC~~ SOLN
40.0000 mg | SUBCUTANEOUS | Status: DC
  Administered 2012-11-02 – 2012-11-07 (×6): 40 mg via SUBCUTANEOUS
  Filled 2012-11-01 (×6): qty 0.4

## 2012-11-01 MED ORDER — POTASSIUM CHLORIDE CRYS ER 20 MEQ PO TBCR: 20.0000 meq | EXTENDED_RELEASE_TABLET | Freq: Once | ORAL | Status: AC | PRN

## 2012-11-01 MED ORDER — HYDROMORPHONE HCL PF 1 MG/ML IJ SOLN
0.2500 mg | INTRAMUSCULAR | Status: DC | PRN
  Administered 2012-11-01 (×2): 0.25 mg via INTRAVENOUS

## 2012-11-01 MED ORDER — GUAIFENESIN-DM 100-10 MG/5ML PO SYRP: 15.0000 mL | ORAL_SOLUTION | ORAL | Status: DC | PRN

## 2012-11-01 MED ORDER — ACETAMINOPHEN 325 MG PO TABS
325.0000 mg | ORAL_TABLET | ORAL | Status: DC | PRN
  Administered 2012-11-06 – 2012-11-07 (×3): 650 mg via ORAL
  Filled 2012-11-01 (×3): qty 2

## 2012-11-01 MED ORDER — METOPROLOL TARTRATE 1 MG/ML IV SOLN
5.0000 mg | Freq: Four times a day (QID) | INTRAVENOUS | Status: DC
  Administered 2012-11-02 – 2012-11-07 (×15): 5 mg via INTRAVENOUS
  Filled 2012-11-01 (×27): qty 5

## 2012-11-01 MED ORDER — ROCURONIUM BROMIDE 100 MG/10ML IV SOLN
INTRAVENOUS | Status: DC | PRN
  Administered 2012-11-01: 50 mg via INTRAVENOUS

## 2012-11-01 MED ORDER — MAGNESIUM SULFATE 40 MG/ML IJ SOLN: 2.0000 g | Freq: Once | INTRAMUSCULAR | Status: AC | PRN

## 2012-11-01 MED ORDER — LIDOCAINE HCL (CARDIAC) 20 MG/ML IV SOLN
INTRAVENOUS | Status: DC | PRN
  Administered 2012-11-01: 80 mg via INTRAVENOUS

## 2012-11-01 MED ORDER — OXYCODONE HCL 5 MG PO TABS: 5.0000 mg | ORAL_TABLET | Freq: Once | ORAL | Status: DC | PRN

## 2012-11-01 MED ORDER — MIDAZOLAM HCL 5 MG/5ML IJ SOLN
INTRAMUSCULAR | Status: DC | PRN
  Administered 2012-11-01: 2 mg via INTRAVENOUS

## 2012-11-01 MED ORDER — GLYCOPYRROLATE 0.2 MG/ML IJ SOLN
INTRAMUSCULAR | Status: DC | PRN
  Administered 2012-11-01: 0.4 mg via INTRAVENOUS
  Administered 2012-11-01: .8 mg via INTRAVENOUS
  Administered 2012-11-01: 0.2 mg via INTRAVENOUS

## 2012-11-01 MED ORDER — VECURONIUM BROMIDE 10 MG IV SOLR
INTRAVENOUS | Status: DC | PRN
  Administered 2012-11-01 (×2): 2 mg via INTRAVENOUS
  Administered 2012-11-01: 1 mg via INTRAVENOUS
  Administered 2012-11-01 (×3): 2 mg via INTRAVENOUS

## 2012-11-01 MED ORDER — ALBUMIN HUMAN 5 % IV SOLN
INTRAVENOUS | Status: DC | PRN
  Administered 2012-11-01 (×2): via INTRAVENOUS

## 2012-11-01 MED ORDER — EPHEDRINE SULFATE 50 MG/ML IJ SOLN
INTRAMUSCULAR | Status: DC | PRN
  Administered 2012-11-01 (×5): 10 mg via INTRAVENOUS

## 2012-11-01 MED ORDER — ONDANSETRON HCL 4 MG/2ML IJ SOLN: 4.0000 mg | Freq: Once | INTRAMUSCULAR | Status: DC | PRN

## 2012-11-01 MED ORDER — ONDANSETRON HCL 4 MG/2ML IJ SOLN
4.0000 mg | Freq: Four times a day (QID) | INTRAMUSCULAR | Status: DC | PRN
  Administered 2012-11-01 – 2012-11-03 (×4): 4 mg via INTRAVENOUS
  Filled 2012-11-01 (×4): qty 2

## 2012-11-01 MED ORDER — ACETAMINOPHEN 650 MG RE SUPP: 325.0000 mg | RECTAL | Status: DC | PRN

## 2012-11-01 MED ORDER — SODIUM CHLORIDE 0.9 % IV SOLN: 500.0000 mL | Freq: Once | INTRAVENOUS | Status: AC | PRN

## 2012-11-01 MED ORDER — LABETALOL HCL 5 MG/ML IV SOLN
10.0000 mg | INTRAVENOUS | Status: DC | PRN
  Filled 2012-11-01 (×2): qty 4

## 2012-11-01 MED ORDER — LACTATED RINGERS IV SOLN
INTRAVENOUS | Status: DC | PRN
  Administered 2012-11-01 (×5): via INTRAVENOUS

## 2012-11-01 MED ORDER — SODIUM CHLORIDE 0.9 % IV SOLN: INTRAVENOUS | Status: DC

## 2012-11-01 MED ORDER — MORPHINE SULFATE 2 MG/ML IJ SOLN
INTRAMUSCULAR | Status: AC
  Administered 2012-11-01: 2 mg via INTRAVENOUS
  Filled 2012-11-01: qty 1

## 2012-11-01 MED ORDER — PHENOL 1.4 % MT LIQD: 1.0000 | OROMUCOSAL | Status: DC | PRN

## 2012-11-01 MED ORDER — HEPARIN SODIUM (PORCINE) 1000 UNIT/ML IJ SOLN
INTRAMUSCULAR | Status: DC | PRN
  Administered 2012-11-01: 2000 [IU] via INTRAVENOUS
  Administered 2012-11-01: 6000 [IU] via INTRAVENOUS

## 2012-11-01 MED ORDER — PHENYLEPHRINE HCL 10 MG/ML IJ SOLN
10.0000 mg | INTRAVENOUS | Status: DC | PRN
  Administered 2012-11-01 (×2): 50 ug/min via INTRAVENOUS

## 2012-11-01 MED ORDER — PANTOPRAZOLE SODIUM 40 MG PO TBEC
40.0000 mg | DELAYED_RELEASE_TABLET | Freq: Every day | ORAL | Status: DC
  Administered 2012-11-04 – 2012-11-07 (×4): 40 mg via ORAL
  Filled 2012-11-01 (×4): qty 1

## 2012-11-01 MED ORDER — ONDANSETRON HCL 4 MG/2ML IJ SOLN
INTRAMUSCULAR | Status: DC | PRN
  Administered 2012-11-01: 4 mg via INTRAVENOUS

## 2012-11-01 MED ORDER — 0.9 % SODIUM CHLORIDE (POUR BTL) OPTIME
TOPICAL | Status: DC | PRN
  Administered 2012-11-01: 2000 mL

## 2012-11-01 MED ORDER — ALUM & MAG HYDROXIDE-SIMETH 200-200-20 MG/5ML PO SUSP: 15.0000 mL | ORAL | Status: DC | PRN

## 2012-11-01 MED ORDER — METOPROLOL TARTRATE 1 MG/ML IV SOLN: 2.0000 mg | INTRAVENOUS | Status: DC | PRN

## 2012-11-01 MED ORDER — SODIUM CHLORIDE 0.9 % IR SOLN
Status: DC | PRN
  Administered 2012-11-01: 10:00:00

## 2012-11-01 MED ORDER — PANTOPRAZOLE SODIUM 40 MG IV SOLR
40.0000 mg | Freq: Every day | INTRAVENOUS | Status: DC
  Administered 2012-11-01 – 2012-11-04 (×4): 40 mg via INTRAVENOUS
  Filled 2012-11-01 (×6): qty 40

## 2012-11-01 MED ORDER — HYDRALAZINE HCL 20 MG/ML IJ SOLN: 10.0000 mg | INTRAMUSCULAR | Status: DC | PRN

## 2012-11-01 MED ORDER — MANNITOL 25 % IV SOLN
INTRAVENOUS | Status: DC | PRN
  Administered 2012-11-01: 25 g via INTRAVENOUS

## 2012-11-01 MED ORDER — HYDROMORPHONE HCL PF 1 MG/ML IJ SOLN
INTRAMUSCULAR | Status: AC
  Filled 2012-11-01: qty 1

## 2012-11-01 MED ORDER — OXYCODONE HCL 5 MG/5ML PO SOLN: 5.0000 mg | Freq: Once | ORAL | Status: DC | PRN

## 2012-11-01 MED ORDER — NEOSTIGMINE METHYLSULFATE 1 MG/ML IJ SOLN
INTRAMUSCULAR | Status: DC | PRN
  Administered 2012-11-01: 5 mg via INTRAVENOUS

## 2012-11-01 MED ORDER — KCL IN DEXTROSE-NACL 20-5-0.45 MEQ/L-%-% IV SOLN
INTRAVENOUS | Status: DC
  Administered 2012-11-01 – 2012-11-05 (×8): via INTRAVENOUS
  Filled 2012-11-01 (×14): qty 1000

## 2012-11-01 MED ORDER — FENTANYL CITRATE 0.05 MG/ML IJ SOLN
INTRAMUSCULAR | Status: DC | PRN
  Administered 2012-11-01 (×2): 100 ug via INTRAVENOUS
  Administered 2012-11-01: 50 ug via INTRAVENOUS
  Administered 2012-11-01: 150 ug via INTRAVENOUS
  Administered 2012-11-01 (×2): 50 ug via INTRAVENOUS

## 2012-11-01 MED ORDER — CEFUROXIME SODIUM 1.5 G IJ SOLR
1.5000 g | Freq: Two times a day (BID) | INTRAMUSCULAR | Status: AC
  Administered 2012-11-01 – 2012-11-02 (×2): 1.5 g via INTRAVENOUS
  Filled 2012-11-01 (×2): qty 1.5

## 2012-11-01 MED ORDER — DOCUSATE SODIUM 100 MG PO CAPS
100.0000 mg | ORAL_CAPSULE | Freq: Every day | ORAL | Status: DC
  Administered 2012-11-04 – 2012-11-07 (×4): 100 mg via ORAL
  Filled 2012-11-01 (×4): qty 1

## 2012-11-01 MED ORDER — MORPHINE SULFATE 2 MG/ML IJ SOLN
2.0000 mg | INTRAMUSCULAR | Status: DC | PRN
  Administered 2012-11-01 (×4): 4 mg via INTRAVENOUS
  Administered 2012-11-01: 2 mg via INTRAVENOUS
  Administered 2012-11-01 – 2012-11-02 (×5): 4 mg via INTRAVENOUS
  Administered 2012-11-02: 5 mg via INTRAVENOUS
  Administered 2012-11-02 – 2012-11-05 (×4): 4 mg via INTRAVENOUS
  Administered 2012-11-06: 5 mg via INTRAVENOUS
  Filled 2012-11-01: qty 2
  Filled 2012-11-01: qty 1
  Filled 2012-11-01 (×8): qty 2
  Filled 2012-11-01: qty 3
  Filled 2012-11-01: qty 2
  Filled 2012-11-01: qty 3
  Filled 2012-11-01 (×4): qty 2

## 2012-11-01 MED ORDER — PROPOFOL 10 MG/ML IV BOLUS
INTRAVENOUS | Status: DC | PRN
  Administered 2012-11-01: 150 mg via INTRAVENOUS

## 2012-11-01 MED ORDER — PROTAMINE SULFATE 10 MG/ML IV SOLN
INTRAVENOUS | Status: DC | PRN
  Administered 2012-11-01: 100 mg via INTRAVENOUS

## 2012-11-01 SURGICAL SUPPLY — 58 items
CANISTER SUCTION 2500CC (MISCELLANEOUS) ×2 IMPLANT
CLIP TI MEDIUM 24 (CLIP) ×2 IMPLANT
CLIP TI WIDE RED SMALL 24 (CLIP) ×2 IMPLANT
CLOTH BEACON ORANGE TIMEOUT ST (SAFETY) ×2 IMPLANT
COVER PROBE W GEL 5X96 (DRAPES) ×2 IMPLANT
COVER SURGICAL LIGHT HANDLE (MISCELLANEOUS) ×2 IMPLANT
DRAPE WARM FLUID 44X44 (DRAPE) ×2 IMPLANT
DRSG COVADERM 4X14 (GAUZE/BANDAGES/DRESSINGS) ×2 IMPLANT
ELECT BLADE 6.5 EXT (BLADE) IMPLANT
ELECT REM PT RETURN 9FT ADLT (ELECTROSURGICAL) ×2
ELECTRODE REM PT RTRN 9FT ADLT (ELECTROSURGICAL) ×1 IMPLANT
FELT TEFLON 4 X1 (Mesh General) ×2 IMPLANT
GLOVE BIO SURGEON STRL SZ7.5 (GLOVE) ×2 IMPLANT
GLOVE BIOGEL PI IND STRL 6.5 (GLOVE) ×2 IMPLANT
GLOVE BIOGEL PI IND STRL 7.0 (GLOVE) ×2 IMPLANT
GLOVE BIOGEL PI IND STRL 7.5 (GLOVE) ×1 IMPLANT
GLOVE BIOGEL PI INDICATOR 6.5 (GLOVE) ×2
GLOVE BIOGEL PI INDICATOR 7.0 (GLOVE) ×2
GLOVE BIOGEL PI INDICATOR 7.5 (GLOVE) ×1
GLOVE ECLIPSE 7.0 STRL STRAW (GLOVE) ×2 IMPLANT
GLOVE SS BIOGEL STRL SZ 7 (GLOVE) ×1 IMPLANT
GLOVE SS BIOGEL STRL SZ 7.5 (GLOVE) ×3 IMPLANT
GLOVE SUPERSENSE BIOGEL SZ 7 (GLOVE) ×1
GLOVE SUPERSENSE BIOGEL SZ 7.5 (GLOVE) ×3
GOWN STRL NON-REIN LRG LVL3 (GOWN DISPOSABLE) IMPLANT
GRAFT HEMASHIELD 16X8MM (Vascular Products) ×2 IMPLANT
INSERT FOGARTY 61MM (MISCELLANEOUS) ×2 IMPLANT
INSERT FOGARTY SM (MISCELLANEOUS) ×4 IMPLANT
KIT BASIN OR (CUSTOM PROCEDURE TRAY) ×2 IMPLANT
KIT ROOM TURNOVER OR (KITS) ×2 IMPLANT
LOOP VESSEL MAXI BLUE (MISCELLANEOUS) IMPLANT
LOOP VESSEL MINI RED (MISCELLANEOUS) IMPLANT
NS IRRIG 1000ML POUR BTL (IV SOLUTION) ×4 IMPLANT
PACK AORTA (CUSTOM PROCEDURE TRAY) ×2 IMPLANT
PAD ARMBOARD 7.5X6 YLW CONV (MISCELLANEOUS) ×4 IMPLANT
SPECIMEN JAR MEDIUM (MISCELLANEOUS) ×2 IMPLANT
SPONGE LAP 18X18 X RAY DECT (DISPOSABLE) ×2 IMPLANT
SPONGE LAP 4X18 X RAY DECT (DISPOSABLE) IMPLANT
STAPLER VISISTAT 35W (STAPLE) ×2 IMPLANT
SUT ETHIBOND 5 LR DA (SUTURE) IMPLANT
SUT PROLENE 1 XLH 60 (SUTURE) ×4 IMPLANT
SUT PROLENE 3 0 SH1 36 (SUTURE) ×4 IMPLANT
SUT PROLENE 4 0 RB 1 (SUTURE)
SUT PROLENE 4-0 RB1 .5 CRCL 36 (SUTURE) IMPLANT
SUT PROLENE 5 0 CC 1 (SUTURE) IMPLANT
SUT PROLENE 5 0 CC1 (SUTURE) ×4 IMPLANT
SUT PROLENE 6 0 C 1 30 (SUTURE) ×2 IMPLANT
SUT SILK 2 0 SH CR/8 (SUTURE) ×2 IMPLANT
SUT SILK 3 0 SH CR/8 (SUTURE) IMPLANT
SUT VIC AB 2-0 CTX 36 (SUTURE) ×4 IMPLANT
SUT VIC AB 3-0 MH 27 (SUTURE) ×6 IMPLANT
SUT VIC AB 3-0 SH 27 (SUTURE) ×2
SUT VIC AB 3-0 SH 27X BRD (SUTURE) ×2 IMPLANT
TOWEL OR 17X24 6PK STRL BLUE (TOWEL DISPOSABLE) ×6 IMPLANT
TOWEL OR 17X26 10 PK STRL BLUE (TOWEL DISPOSABLE) ×4 IMPLANT
TRAY FOLEY CATH 14FRSI W/METER (CATHETERS) ×2 IMPLANT
WATER STERILE IRR 1000ML POUR (IV SOLUTION) ×4 IMPLANT
WIRE J 3MM .035X145CM (WIRE) ×2 IMPLANT

## 2012-11-01 NOTE — Anesthesia Preprocedure Evaluation (Signed)
Anesthesia Evaluation  Patient identified by MRN, date of birth, ID band Patient awake    Reviewed: Allergy & Precautions, H&P , NPO status , Patient's Chart, lab work & pertinent test results, reviewed documented beta blocker date and time   Airway Mallampati: I TM Distance: >3 FB Neck ROM: Full    Dental  (+) Teeth Intact and Dental Advisory Given   Pulmonary former smoker,  breath sounds clear to auscultation        Cardiovascular hypertension, Pt. on medications + Peripheral Vascular Disease Rhythm:Regular Rate:Normal     Neuro/Psych    GI/Hepatic GERD-  Medicated and Controlled,  Endo/Other    Renal/GU      Musculoskeletal   Abdominal   Peds  Hematology   Anesthesia Other Findings   Reproductive/Obstetrics                           Anesthesia Physical Anesthesia Plan  ASA: III  Anesthesia Plan: General   Post-op Pain Management:    Induction: Intravenous  Airway Management Planned: Oral ETT  Additional Equipment: Arterial line and PA Cath  Intra-op Plan:   Post-operative Plan: Extubation in OR  Informed Consent: I have reviewed the patients History and Physical, chart, labs and discussed the procedure including the risks, benefits and alternatives for the proposed anesthesia with the patient or authorized representative who has indicated his/her understanding and acceptance.   Dental advisory given  Plan Discussed with: CRNA, Anesthesiologist and Surgeon  Anesthesia Plan Comments:         Anesthesia Quick Evaluation

## 2012-11-01 NOTE — H&P (View-Only) (Signed)
Subjective:     Patient ID: [name], male   DOB: 03/20/1951, 61 y.o.   MRN: [id number]  HPI this 61-year-old male status post repair of a type A aortic dissection by Dr. Van Tright in September of 2013. He was also found to have a 6.5 cm infrarenal abdominal aortic aneurysm. The dissection extended down into the infrarenal aneurysm and into the left renal artery. Patient has recovered nicely from the aortic surgery and now returns for discussion regarding his infrarenal aortic aneurysm. He had a CT angiogram performed last week which I have reviewed with multiple colleagues. He does not appear to be a candidate for aortic stent grafting. He does have a true and false lumen extending below the renal arteries and the left renal artery was perfused through both the true and false lumen. The right renal artery is perfused through the true lumen reservoir measures approximately 6.7 cm in maximum diameter. Patient has had some burning discomfort in his chest and some numbness and burning in the lower extremities and is scheduled for a nuclear stress test later this week.  Past Medical History  Diagnosis Date  . OBSTRUCTIVE SLEEP APNEA 02/2008 sleep study  . MIGRAINE HEADACHE   . ERECTILE DYSFUNCTION, ORGANIC   . Unspecified vitamin D deficiency   . HYPERLIPIDEMIA   . ASTHMA   . ALLERGIC RHINITIS   . Dissecting aortic aneurysm, thoracic 02/20/2012    s/p emergent surgical repair  . Hyperglycemia   . Anxiety     History  Substance Use Topics  . Smoking status: Former Smoker -- 0.10 packs/day    Quit date: 02/18/2012  . Smokeless tobacco: Never Used     Comment: 10 cigs/day, quit 02/2012 after TAA emergent repair  . Alcohol Use: Yes     Comment: Rarely    Family History  Problem Relation Age of Onset  . Diabetes Mother   . Heart disease Maternal Uncle   . Heart disease Paternal Uncle   . Heart disease Cousin   . Lung cancer Cousin     2 cousins    Allergies  Allergen Reactions   . Albuterol   . Testosterone Shortness Of Breath    Medication:Androgel Pt states trouble breathing at night    Current outpatient prescriptions:ALPRAZolam (XANAX) 1 MG tablet, Take 1 tablet (1 mg total) by mouth at bedtime as needed for sleep., Disp: 30 tablet, Rfl: 1;  aspirin 81 MG chewable tablet, Chew 1 tablet (81 mg total) by mouth daily., Disp: , Rfl: ;  cholecalciferol (VITAMIN D) 1000 UNITS tablet, Take 2,000 Units by mouth daily., Disp: , Rfl:  EPINEPHrine (EPI-PEN) 0.3 mg/0.3 mL DEVI, Inject 0.3 mg into the muscle as needed. For anaphylaxis , Disp: , Rfl: ;  fexofenadine (ALLEGRA) 180 MG tablet, Take 180 mg by mouth daily.  , Disp: , Rfl: ;  fluticasone (FLONASE) 50 MCG/ACT nasal spray, USE 1 SPRAY IN EACH NOSTRIL AS NEEDED, Disp: 16 g, Rfl: 2;  lisinopril (PRINIVIL,ZESTRIL) 10 MG tablet, Take 0.5 tablets (5 mg total) by mouth 2 (two) times daily., Disp: 90 tablet, Rfl: 1 metoprolol tartrate (LOPRESSOR) 25 MG tablet, TAKE 1 TABLET BY MOUTH 2 TIMES DAILY., Disp: 60 tablet, Rfl: 11;  omega-3 acid ethyl esters (LOVAZA) 1 G capsule, Take 1 capsule (1 g total) by mouth 2 (two) times daily., Disp: 180 capsule, Rfl: 2;  rosuvastatin (CRESTOR) 10 MG tablet, Take 10 mg by mouth daily., Disp: , Rfl: ;  tretinoin (RETIN-A) 0.1 % cream, Apply   1 application topically daily as needed. For acne or rash, Disp: , Rfl:   BP 120/73  Pulse 85  Resp 18  Ht 6' 4" (1.93 m)  Wt 226 lb (102.513 kg)  BMI 27.52 kg/m2  Body mass index is 27.52 kg/(m^2).          Review of Systems complains of burning in the chest, migraine headaches, visual visual disturbances, dyspnea on exertion, history of DVT in the left leg which is now resolved, varicose veins, numbness in the legs, occasional dizziness. Other systems negative complete review of systems     Objective:   Physical Exam blood pressure 120/73 heart rate 85 respirations 18 Gen.-alert and oriented x3 in no apparent distress HEENT normal for  age Lungs no rhonchi or wheezing Cardiovascular regular rhythm no murmurs carotid pulses 3+ palpable no bruits audible Abdomen soft nontender -6-7 cm pulsatile mass-nontender  Musculoskeletal free of  major deformities Skin clear -no rashes Neurologic normal Lower extremities 3+ femoral and dorsalis pedis pulses palpable bilaterally with no edema      Assessment:     Large infrarenal abdominal aortic aneurysm-status post repair of type A aortic dissection in September 2013 Left kidney perfused through both true and false lumen  I discussed the potential risks with the patient and his daughter including loss of the left kidney because of thrombosis due to dissection into the distal vessel. They understand this risk and would like to proceed in the near future    Plan:     Check results of nuclear stress test to be performed later this week If this is okay we'll proceed on Wednesday, May 21 with resection and grafting of infrarenal abdominal aortic aneurysm with possible left renal artery bypass risks and benefits thoroughly discussed the patient would like to proceed      

## 2012-11-01 NOTE — Transfer of Care (Signed)
Immediate Anesthesia Transfer of Care Note  Patient: [name]  Procedure(s) Performed: Procedure(s) with comments: ANEURYSM ABDOMINAL AORTIC REPAIR (N/A) - Resection and Grafting of Abdominal Aortic Aneurysm,(AORTA BI ILIAC)  Patient Location: PACU  Anesthesia Type:General  Level of Consciousness: awake, alert  and patient cooperative  Airway & Oxygen Therapy: Patient Spontanous Breathing and Patient connected to face mask oxygen  Post-op Assessment: Report given to PACU RN, Post -op Vital signs reviewed and stable and Patient moving all extremities  Post vital signs: Reviewed and stable  Complications: No apparent anesthesia complications

## 2012-11-01 NOTE — Anesthesia Procedure Notes (Signed)
Procedure Name: Intubation Date/Time: 11/01/2012 8:49 AM Performed by: Jerilee Hoh Pre-anesthesia Checklist: Patient identified, Emergency Drugs available, Suction available, Patient being monitored and Timeout performed Patient Re-evaluated:Patient Re-evaluated prior to inductionOxygen Delivery Method: Circle system utilized Preoxygenation: Pre-oxygenation with 100% oxygen Intubation Type: IV induction Ventilation: Oral airway inserted - appropriate to patient size and Mask ventilation with difficulty Laryngoscope Size: Mac and 3 Grade View: Grade I Tube type: Oral Tube size: 7.5 mm Number of attempts: 1 Airway Equipment and Method: Stylet Placement Confirmation: ETT inserted through vocal cords under direct vision,  breath sounds checked- equal and bilateral,  positive ETCO2 and CO2 detector Secured at: 24 cm Tube secured with: Tape Dental Injury: Teeth and Oropharynx as per pre-operative assessment

## 2012-11-01 NOTE — Anesthesia Postprocedure Evaluation (Signed)
  Anesthesia Post-op Note  Patient: [name]  Procedure(s) Performed: Procedure(s) with comments: ANEURYSM ABDOMINAL AORTIC REPAIR (N/A) - Resection and Grafting of Abdominal Aortic Aneurysm,(AORTA BI ILIAC)  Patient Location: PACU  Anesthesia Type:General  Level of Consciousness: awake and alert   Airway and Oxygen Therapy: Patient Spontanous Breathing and Patient connected to face mask oxygen  Post-op Pain: mild  Post-op Assessment: Post-op Vital signs reviewed  Post-op Vital Signs: Reviewed  Complications: No apparent anesthesia complications

## 2012-11-01 NOTE — Op Note (Addendum)
OPERATIVE REPORT  Date of Surgery: 10/27/2012 - 11/01/2012  Surgeon: Josephina Gip, MD  Assistant: Dr. Tawanna Cooler early Pre-op Diagnosis: Infrarenal abdominal aortic aneurysm-status post Type A  aortic dissection which extended into left renal artery Post-op Diagnosis: Same Procedure: Procedure(s): Resection and grafting of infrarenal abdominal aortic aneurysm with insertion aorto by common iliac graft-16 x 8 mm Hemashield Anesthesia: General  EBL: 1400 cc  Complications: None  Procedure Details: Patient was taken to the operating room placed in supine position at which time satisfactory general endotracheal anesthesia was administered. Swan-Ganz catheter and a left brachial arterial line were inserted by anesthesia for monitoring purposes. The abdomen and groins were prepped with Betadine scrub and solution draped in routine sterile manner. Midline incision was made from xiphoid to below the umbilicus carried down through subcutaneous tissue and linea alba using the Bovie. The peritoneal cavity was entered and thoroughly explored. The stomach duodenum small bowel and colon were unremarkable. Liver smooth no palpable masses. Gallbladder appeared normal no stones were palpable. Transverse colon was elevated the intestines were reflected to the right side exposing a 6-1/2-7 cm abdominal aortic aneurysm. Retroperitoneum was incised exposing the aneurysm from the renal arteries to the bifurcation of both common iliac arteries. The common iliac arteries appeared normal with no aneurysmal change and no evidence of atherosclerosis. Control of the infrarenal neck was obtained. The left renal vein was mobilized after ligating and dividing the gonadal and adrenal branches. The left renal artery was exposed down to its primary branches. There was evidence of the dissection on physical exam. There was excellent Doppler flow in the distal left renal artery. Patient was given 25 g of mannitol and heparinized. Aorta was  occluded distal to the renal arteries and both common iliac arteries occluded with vascular clamps. Aneurysm was opened anteriorly. There was no backbleeding coming from the IMA which was chronically occluded. Lumbars were oversewn with 2-0 silk figure-of-eight sutures from within. The neck of the aneurysm was transected about 2-3 cm distal to the right renal artery. The dissection flap extended distal to the renal arteries with the true lumen being anterior and supplying the right kidney in the posterior false lumen supplied the left kidney as well as the true limb and supplying the left kidney. The neck was smooth the anterior true limb and occupied about 75% of the lumen and the posterior false lumen about 25%. The dissection flap was then excised up to the clamp leaving a smooth surface. A 16 x 8 mm Hemashield Dacron graft was anastomosed end-to-end to the aortic stump using continuous 3-0 Prolene and this was buttressed with a strip of felt checked for leaks none were present. Following this there was excellent Doppler flow to the left renal artery into the primary branches. Both common iliac arteries were transected and both were very smooth and in the end anastomoses were done beginning with the left side with 5-0 Prolene followed by the right side with 5-0 Prolene. Legs were opened sequentially with no significant hypotension. Protamine was then given to reverse the heparin the inferior mesenteric artery was ligated at its origin with a 2-0 silk tie although it was chronically occluded. Again we listened to both renal arteries and they both had excellent flow distally. Following adequate hemostasis the aneurysm was closed over the graft with 3-0 Vicryl retroperitoneum proximally with 3-0 Vicryl following adequate hemostasis and thorough irrigation and linea alba closed #1 Prolene the skin with clips sterile dressing applied patient taken to recovery room in  stable condition estimated blood loss 05-1399 cc  patient received 650 cc of blood from [facility]ver excellent urinary output was stable hemodynamically throughout the case and was extubated in the operating room  Josephina Gip, MD 11/01/2012 12:30 PM

## 2012-11-01 NOTE — Preoperative (Signed)
Beta Blockers   Reason not to administer Beta Blockers:Not Applicable 

## 2012-11-01 NOTE — Interval H&P Note (Signed)
History and Physical Interval Note:  11/01/2012 8:24 AM  [name]  has presented today for surgery, with the diagnosis of Abdominal Aortic Aneurysm Status post Aortic Dissection  The various methods of treatment have been discussed with the patient and family. After consideration of risks, benefits and other options for treatment, the patient has consented to  Procedure(s) with comments: ANEURYSM ABDOMINAL AORTIC REPAIR (N/A) - Resection and Grafting of Abdominal Aortic Aneurysm; Possible Left Renal Artery Bypass as a surgical intervention .  The patient's history has been reviewed, patient examined, no change in status, stable for surgery.  I have reviewed the patient's chart and labs.  Questions were answered to the patient's satisfaction.     [name]

## 2012-11-01 NOTE — Progress Notes (Signed)
  MEDICATION RELATED CONSULT NOTE - INITIAL   Pharmacy consulted for renal antibiotic adjustment. 61yom s/p resection and grafting of AAA to receive Zinacef for post surgical prophylaxis. Wt 101kgkg, CrCl >169ml/min, no antibiotic allergies listed. Patient has Zinacef 1.5g IV q12h x 2 doses ordered - dosing appropriate. Pharmacy will sign off. Please reconsult if additional assistance is needed. Thanks!!  Cleon Dew, PharmD [phone] 11/01/2012

## 2012-11-02 ENCOUNTER — Inpatient Hospital Stay (HOSPITAL_COMMUNITY): Payer: 59

## 2012-11-02 ENCOUNTER — Ambulatory Visit: Payer: Self-pay | Admitting: Neurology

## 2012-11-02 ENCOUNTER — Encounter (HOSPITAL_COMMUNITY): Payer: Self-pay | Admitting: Vascular Surgery

## 2012-11-02 LAB — TYPE AND SCREEN: Unit division: 0

## 2012-11-02 LAB — CBC
MCH: 31.2 pg (ref 26.0–34.0)
MCHC: 34.9 g/dL (ref 30.0–36.0)
Platelets: 106 10*3/uL — ABNORMAL LOW (ref 150–400)
RBC: 3.94 MIL/uL — ABNORMAL LOW (ref 4.22–5.81)

## 2012-11-02 LAB — COMPREHENSIVE METABOLIC PANEL
ALT: 21 U/L (ref 0–53)
AST: 21 U/L (ref 0–37)
Calcium: 7.8 mg/dL — ABNORMAL LOW (ref 8.4–10.5)
Potassium: 4.1 mEq/L (ref 3.5–5.1)
Sodium: 137 mEq/L (ref 135–145)
Total Protein: 5.3 g/dL — ABNORMAL LOW (ref 6.0–8.3)

## 2012-11-02 LAB — POCT I-STAT 3, ART BLOOD GAS (G3+)
Acid-base deficit: 1 mmol/L (ref 0.0–2.0)
pCO2 arterial: 39.4 mmHg (ref 35.0–45.0)
pH, Arterial: 7.389 (ref 7.350–7.450)
pO2, Arterial: 74 mmHg — ABNORMAL LOW (ref 80.0–100.0)

## 2012-11-02 MED ORDER — DIPHENHYDRAMINE HCL 50 MG/ML IJ SOLN: 12.5000 mg | Freq: Four times a day (QID) | INTRAMUSCULAR | Status: DC | PRN

## 2012-11-02 MED ORDER — MORPHINE SULFATE (PF) 1 MG/ML IV SOLN
INTRAVENOUS | Status: DC
  Administered 2012-11-02: 16:00:00 via INTRAVENOUS
  Administered 2012-11-02: 12 mg via INTRAVENOUS
  Administered 2012-11-02: 8.52 mg via INTRAVENOUS
  Administered 2012-11-02: 09:00:00 via INTRAVENOUS
  Administered 2012-11-02: 15 mg via INTRAVENOUS
  Administered 2012-11-03: via INTRAVENOUS
  Administered 2012-11-03: 6 mg via INTRAVENOUS
  Administered 2012-11-03: 16.23 mg via INTRAVENOUS
  Administered 2012-11-03: 09:00:00 via INTRAVENOUS
  Administered 2012-11-03: 6 mg via INTRAVENOUS
  Administered 2012-11-03: 1.5 mg via INTRAVENOUS
  Administered 2012-11-03: 02:00:00 via INTRAVENOUS
  Administered 2012-11-03: 16.5 mg via INTRAVENOUS
  Filled 2012-11-02 (×5): qty 25

## 2012-11-02 MED ORDER — NALOXONE HCL 0.4 MG/ML IJ SOLN: 0.4000 mg | INTRAMUSCULAR | Status: DC | PRN

## 2012-11-02 MED ORDER — SODIUM CHLORIDE 0.9 % IJ SOLN: 9.0000 mL | INTRAMUSCULAR | Status: DC | PRN

## 2012-11-02 MED ORDER — DIPHENHYDRAMINE HCL 12.5 MG/5ML PO ELIX
12.5000 mg | ORAL_SOLUTION | Freq: Four times a day (QID) | ORAL | Status: DC | PRN
  Filled 2012-11-02: qty 5

## 2012-11-02 MED FILL — Heparin Sodium (Porcine) Inj 1000 Unit/ML: INTRAMUSCULAR | Qty: 30 | Status: AC

## 2012-11-02 MED FILL — Sodium Chloride IV Soln 0.9%: INTRAVENOUS | Qty: 3000 | Status: AC

## 2012-11-02 NOTE — Significant Event (Signed)
Delayed in removing NG tube today as patient wanted to keep it-patient was anxious he will be nauseated if NG was discontinue. NG was clamped for four hours to assess for nausea. Pt did not experience nausea during those hours NG was clamped. NG removed at 1810pm per patient's agreement. Will continue to monitor. Karanvir Balderston, Charity fundraiser.

## 2012-11-02 NOTE — Significant Event (Signed)
PCA full-dose morphine initial set-up started with second RN verifying at bedside Rowe Clack, RN). Pt instructed on its use. Patient is educated on s/s of overdose. Will continue to monitor closely. Lurlie Wigen, Charity fundraiser.

## 2012-11-02 NOTE — Progress Notes (Addendum)
[facility]  Post-op  Intra-abdominal Surgery note  Date of Surgery: 10/27/2012 - 11/01/2012  Surgeon(s): Pryor Ochoa, MD Larina Earthly, MD  1 Day Post-Op Procedure(s): ANEURYSM ABDOMINAL AORTIC REPAIR  History of Present Illness  [name] is a 62 y.o. male who is  up s/p Procedure(s): ANEURYSM ABDOMINAL AORTIC REPAIR Pt is doing well. complains of incisional pain; denies nausea/vomiting; denies diarrhea. has not had flatus;has not had BM  IMAGING: Dg Chest Portable 1 View  11/02/2012   *RADIOLOGY REPORT*  Clinical Data: Postoperative examination  PORTABLE CHEST - 1 VIEW  Comparison: 11/01/2012; 04/12/2013  Findings: Grossly unchanged enlarged cardiac silhouette and mediastinal contours post median sternotomy.  Stable positioning of support apparatus.  No pneumothorax.  Pulmonary vasculature remains indistinct with cephalization of flow.  Left perihilar heterogeneous opacities are grossly unchanged.  No new focal airspace opacities.  No definite pleural effusion.  Unchanged bones.  IMPRESSION: 1.  Stable positioning of support apparatus.  No pneumothorax. 2.  Persistent findings of mild pulmonary venous congestion and left perihilar atelectasis.   Original Report Authenticated By: Tacey Ruiz, MD   Dg Chest Portable 1 View  11/01/2012   *RADIOLOGY REPORT*  Clinical Data: Evaluate line placement  PORTABLE CHEST - 1 VIEW  Comparison: 04/12/2012  Findings: Right IJ PA catheter is noted with tip in the expected location of the main pulmonary artery.  Nasogastric tube is identified with side port below GE junction.  Heart size appears normal.  Lung volumes are low.  There is pulmonary vascular congestion. Granuloma is identified the right upper lobe.  IMPRESSION:  1.  Cardiac enlargement and pulmonary vascular congestion. 2.  PA catheter tip is in the expected location of the main pulmonary artery.   Original Report Authenticated By: Signa Kell, M.D.   Dg  Abd Portable 1v  11/01/2012   *RADIOLOGY REPORT*  Clinical Data: Postop AAA repair  PORTABLE ABDOMEN - 1 VIEW  Comparison: 10/10/2012  Findings: Normal bowel gas pattern.  No abnormal bowel dilatation identified.  There is skin staples along the midline of the abdomen.  IMPRESSION:  1.  Normal bowel gas pattern.   Original Report Authenticated By: Signa Kell, M.D.    Significant Diagnostic Studies: CBC Lab Results  Component Value Date   WBC 9.7 11/02/2012   HGB 12.3* 11/02/2012   HCT 35.2* 11/02/2012   MCV 89.3 11/02/2012   PLT 106* 11/02/2012    BMET    Component Value Date/Time   NA 137 11/02/2012 0320   K 4.1 11/02/2012 0320   CL 103 11/02/2012 0320   CO2 25 11/02/2012 0320   GLUCOSE 143* 11/02/2012 0320   BUN 15 11/02/2012 0320   CREATININE 0.68 11/02/2012 0320   CREATININE 0.84 06/09/2012 1515   CALCIUM 7.8* 11/02/2012 0320   GFRNONAA >90 11/02/2012 0320   GFRAA >90 11/02/2012 0320    COAG Lab Results  Component Value Date   INR 1.19 11/01/2012   INR 1.10 10/27/2012   INR 2.50 04/24/2012   No results found for this basename: PTT    I/O last 3 completed shifts: In: 5836.7 [I.V.:4501.7; Blood:575; NG/GT:210; IV Piggyback:550] Out: 3325 [Urine:1925; Blood:1400]    Physical Examination BP Readings from Last 3 Encounters:  11/02/12 113/62  11/02/12 113/62  10/27/12 120/64   Temp Readings from Last 3 Encounters:  11/02/12 99.3 F (37.4 C)   11/02/12 99.3 F (37.4 C)   06/27/12 98.2 F (36.8 C) Oral  SpO2 Readings from Last 3 Encounters:  11/02/12 96%  11/02/12 96%  10/27/12 95%   Pulse Readings from Last 3 Encounters:  11/02/12 78  11/02/12 78  10/27/12 86    General: A&O x 3, WDWN male in NAD Pulmonary: normal non-labored breathing , without Rales, rhonchi,  wheezing Cardiac: Heart rate : regular ,  Abdomen:positive findings: Tender to palpation neg. bowel sounds Abdominal wound:clean, dry, intact  Neurologic: A&O X 3; Appropriate Affect ;  Speech is  fluent/normal  Vascular Exam:BLE warm and well perfused Extremities without ischemic changes, no Gangrene, no cellulitis; no open wounds;    Assessment/Plan: [name] is a 62 y.o. male who is 1 Day Post-Op Procedure(s): ANEURYSM ABDOMINAL AORTIC REPAIR D/C swan and NG tube Keep NPO Order morphine PCA Amylase, cbc and bmet ordered for am       [facility]  11/02/2012 8:31 AM   Agree with the above assessment Abdomen is appropriate mildly tender 3+ femoral 2+ dorsalis pedis pulses palpable Patient alert and complaining of moderately severe incisional pain-on IV morphine Will change to PCA morphine with additional morphine for breakthrough pain BC brachial A-line, swan, NG tube with strict n.p.o.  Doing very well again the mobilized today

## 2012-11-02 NOTE — Evaluation (Signed)
Occupational Therapy Evaluation Patient Details Name: [name] MRN: [id number] DOB: 30-Oct-1950 Today's Date: 11/02/2012 Time: 4098-1191 OT Time Calculation (min): 27 min  OT Assessment / Plan / Recommendation Clinical Impression  Pt is recovering from AAA.  Limited eval today due to pain and fatigue and pt's anxiety with movement.  Pt requiring significant assist for ADL and mobility POD1 and may benefit from CIR prior to return home with daughter's assist initially.  Will follow to address self care and transfers for ADL.    OT Assessment  Patient needs continued OT Services    Follow Up Recommendations  CIR    Barriers to Discharge      Equipment Recommendations       Recommendations for Other Services Rehab consult  Frequency  Min 2X/week    Precautions / Restrictions Precautions Precautions: Fall   Pertinent Vitals/Pain Abdominal 10/10 repositioned more reclined, PCA pump encouraged    ADL  Eating/Feeding: NPO Grooming: Wash/dry hands;Wash/dry face;Brushing hair;Minimal assistance Where Assessed - Grooming: Unsupported sitting Upper Body Bathing: Maximal assistance Where Assessed - Upper Body Bathing: Unsupported sitting Lower Body Bathing: +1 Total assistance Where Assessed - Lower Body Bathing: Unsupported sitting Upper Body Dressing: Maximal assistance Where Assessed - Upper Body Dressing: Unsupported sitting Lower Body Dressing: +1 Total assistance Where Assessed - Lower Body Dressing: Unsupported sitting ADL Comments: Pt reports his extremities feeling heavy.  Pt with hx of post op neuropathy and delirium.    OT Diagnosis: Generalized weakness;Acute pain  OT Problem List: Decreased strength;Decreased activity tolerance;Impaired balance (sitting and/or standing);Pain;Impaired UE functional use;Impaired sensation;Decreased knowledge of precautions;Decreased knowledge of use of DME or AE OT Treatment Interventions: Self-care/ADL training;Energy  conservation;DME and/or AE instruction;Therapeutic activities;Patient/family education   OT Goals Acute Rehab OT Goals OT Goal Formulation: With patient Time For Goal Achievement: 11/16/12 Potential to Achieve Goals: Good ADL Goals Pt Will Perform Grooming: with supervision;Standing at sink ADL Goal: Grooming - Progress: Goal set today Pt Will Perform Upper Body Bathing: with modified independence;Sitting at sink ADL Goal: Upper Body Bathing - Progress: Goal set today Pt Will Perform Lower Body Bathing: with supervision;Sit to stand from chair;Sitting at sink;Standing at sink ADL Goal: Lower Body Bathing - Progress: Goal set today Pt Will Perform Upper Body Dressing: with set-up;Sitting, bed ADL Goal: Upper Body Dressing - Progress: Goal set today Pt Will Perform Lower Body Dressing: with supervision;Sit to stand from bed ADL Goal: Lower Body Dressing - Progress: Goal set today Pt Will Transfer to Toilet: with supervision;Ambulation;with DME;3-in-1 ADL Goal: Toilet Transfer - Progress: Goal set today Pt Will Perform Toileting - Clothing Manipulation: with supervision;Standing ADL Goal: Toileting - Clothing Manipulation - Progress: Goal set today Pt Will Perform Toileting - Hygiene: Independently;Sitting on 3-in-1 or toilet ADL Goal: Toileting - Hygiene - Progress: Goal set today  Visit Information  Last OT Received On: 11/02/12 Assistance Needed: +2 PT/OT Co-Evaluation/Treatment: Yes    Subjective Data  Subjective: "I have some questions to ask you." Patient Stated Goal: Return to active lifestyle and full time work.   Prior Functioning     Home Living Lives With: Alone Available Help at Discharge: Family;Available 24 hours/day Type of Home: House Home Access: Stairs to enter [employer] of Steps: 2 Home Layout: One level Bathroom Shower/Tub: Health visitor: Standard Home Adaptive Equipment: Built-in shower seat;Grab bars in shower Prior  Function Level of Independence: Independent Able to Take Stairs?: Yes Driving: Yes Vocation: Full time employment Communication Communication: No difficulties Dominant Hand: Right  Vision/Perception Vision - History Baseline Vision: Wears glasses all the time Patient Visual Report: No change from baseline   Cognition  Cognition Arousal/Alertness: Awake/alert Behavior During Therapy: WFL for tasks assessed/performed Overall Cognitive Status: Within Functional Limits for tasks assessed    Extremity/Trunk Assessment Right Upper Extremity Assessment RUE ROM/Strength/Tone: [facility] for tasks assessed RUE Sensation: Deficits;History of peripheral neuropathy RUE Sensation Deficits: hx of bracial plexus injury; raynauds  Left Upper Extremity Assessment LUE ROM/Strength/Tone: WFL for tasks assessed LUE Sensation: WFL - Light Touch Right Lower Extremity Assessment RLE ROM/Strength/Tone: Deficits;Unable to fully assess;Due to pain RLE ROM/Strength/Tone Deficits: general weakness with limited ROM secondary to pain RLE Sensation: Deficits;History of peripheral neuropathy Left Lower Extremity Assessment LLE ROM/Strength/Tone: Deficits;Unable to fully assess;Due to pain LLE ROM/Strength/Tone Deficits: general weakness with limited ROM secondary to pain LLE Sensation: Deficits;History of peripheral neuropathy     Mobility Bed Mobility Bed Mobility: Not assessed Details for Bed Mobility Assistance: pt received in bedside chair     Exercise    Balance Balance Balance Assessed: Yes Static Sitting Balance Static Sitting - Balance Support: Bilateral upper extremity supported;Feet supported Static Sitting - Level of Assistance: Other (comment) (heavy reliance on chair back unable to self support 2/2 pain) Static Sitting - Comment/# of Minutes: patient with significant degree of pain in abdomen limiting patients ability and willingness to self support. Patient very guarded and anxious  ZO:XWRU.   End of Session OT - End of Session Activity Tolerance: Patient limited by pain;Patient limited by fatigue Patient left: in chair;with call bell/phone within reach;with family/visitor present Nurse Communication: Mobility status  GO     [name] 11/02/2012, 1:16 PM [phone]

## 2012-11-02 NOTE — Evaluation (Signed)
Physical Therapy Evaluation Patient Details Name: [name] MRN: [id number] DOB: 1950-10-08 Today's Date: 11/02/2012 Time: 4098-1191 PT Time Calculation (min): 27 min  PT Assessment / Plan / Recommendation Clinical Impression  Pt is a 62 y.o. male s/p AAA repair. Patient evaluation significantly limited by pain today. Patient very anxious about mobility and activities performed in bedside chair were minimal.  Spoke with patient and daughter at length regarding pt's concerns over activity as well as expectations for symptom management realted to mobility.  Will assess patient mobility further next session. May need CIR vs HHPT pending patient ability and progress.  Will continue to see as indicated to determine needs.     PT Assessment  Patient needs continued PT services    Follow Up Recommendations  Other (comment) (TBD possible CIR consult)          Equipment Recommendations  Other (comment) (TBD)    Recommendations for Other Services  (TBD; possible rehab consult)   Frequency Min 3X/week    Precautions / Restrictions     Pertinent Vitals/Pain 10/10; heavy reliance on PCA      Mobility  Bed Mobility Bed Mobility: Not assessed Details for Bed Mobility Assistance: pt received in bedside chair    Exercises General Exercises - Lower Extremity Ankle Circles/Pumps: AROM;Both;20 reps (educated on circulation impact)   PT Diagnosis: Generalized weakness;Acute pain  PT Problem List: Decreased strength;Decreased range of motion;Decreased activity tolerance;Decreased balance;Decreased mobility;Pain PT Treatment Interventions: DME instruction;Gait training;Stair training;Functional mobility training;Therapeutic activities;Therapeutic exercise;Patient/family education   PT Goals Acute Rehab PT Goals PT Goal Formulation: With patient Time For Goal Achievement: 11/16/12 Potential to Achieve Goals: Good Pt will go Supine/Side to Sit: with modified independence PT Goal:  Supine/Side to Sit - Progress: Goal set today Pt will Stand: with modified independence PT Goal: Stand - Progress: Goal set today Pt will Ambulate: >150 feet;with modified independence PT Goal: Ambulate - Progress: Goal set today Pt will Go Up / Down Stairs: 3-5 stairs;with supervision PT Goal: Up/Down Stairs - Progress: Goal set today  Visit Information  Last PT Received On: 11/02/12 Assistance Needed: +2    Subjective Data  Subjective: I notice that when I get very stressed the numbness gets worse in my feet is that possible Patient Stated Goal: to feel better   Prior Functioning  Home Living Lives With: Alone Available Help at Discharge: Family;Available 24 hours/day Type of Home: House Home Access: Stairs to enter [employer] of Steps: 2 Home Layout: One level Bathroom Shower/Tub: Health visitor: Standard Home Adaptive Equipment: Built-in shower seat;Grab bars in shower Prior Function Level of Independence: Independent Able to Take Stairs?: Yes Driving: Yes Vocation: Full time employment Communication Communication: No difficulties Dominant Hand: Right    Cognition  Cognition Arousal/Alertness: Awake/alert Behavior During Therapy: WFL for tasks assessed/performed Overall Cognitive Status: Within Functional Limits for tasks assessed    Extremity/Trunk Assessment Right Upper Extremity Assessment RUE ROM/Strength/Tone: [facility] for tasks assessed RUE Sensation: Deficits;History of peripheral neuropathy RUE Sensation Deficits: hx of bracial plexus injury; raynauds  Left Upper Extremity Assessment LUE ROM/Strength/Tone: WFL for tasks assessed LUE Sensation: WFL - Light Touch Right Lower Extremity Assessment RLE ROM/Strength/Tone: Deficits;Unable to fully assess;Due to pain RLE ROM/Strength/Tone Deficits: general weakness with limited ROM secondary to pain RLE Sensation: Deficits;History of peripheral neuropathy Left Lower Extremity  Assessment LLE ROM/Strength/Tone: Deficits;Unable to fully assess;Due to pain LLE ROM/Strength/Tone Deficits: general weakness with limited ROM secondary to pain LLE Sensation: Deficits;History of peripheral neuropathy  Balance Balance Balance Assessed: Yes Static Sitting Balance Static Sitting - Balance Support: Bilateral upper extremity supported;Feet supported Static Sitting - Level of Assistance: Other (comment) (heavy reliance on chair back unable to self support 2/2 pain) Static Sitting - Comment/# of Minutes: patient with significant degree of pain in abdomen limiting patients ability and willingness to self support. Patient very guarded and anxious ZO:XWRU.  End of Session PT - End of Session Equipment Utilized During Treatment: Oxygen Activity Tolerance: Patient limited by fatigue;Patient limited by pain;Treatment limited secondary to medication Patient left: in chair;with call bell/phone within reach;with family/visitor present Nurse Communication: Mobility status  GP     Fabio Asa 11/02/2012, 12:55 PM Charlotte Crumb, PT DPT  [phone]

## 2012-11-03 LAB — CBC
MCH: 31.2 pg (ref 26.0–34.0)
MCHC: 34.4 g/dL (ref 30.0–36.0)
MCV: 90.5 fL (ref 78.0–100.0)
Platelets: 90 10*3/uL — ABNORMAL LOW (ref 150–400)
RDW: 13.8 % (ref 11.5–15.5)

## 2012-11-03 LAB — BASIC METABOLIC PANEL
Calcium: 7.9 mg/dL — ABNORMAL LOW (ref 8.4–10.5)
Creatinine, Ser: 0.76 mg/dL (ref 0.50–1.35)
GFR calc Af Amer: >90 mL/min (ref >90)
GFR calc non Af Amer: >90 mL/min (ref >90)

## 2012-11-03 MED ORDER — ONDANSETRON HCL 4 MG/2ML IJ SOLN
4.0000 mg | Freq: Four times a day (QID) | INTRAMUSCULAR | Status: DC
  Administered 2012-11-03 – 2012-11-04 (×6): 4 mg via INTRAVENOUS
  Filled 2012-11-03 (×6): qty 2

## 2012-11-03 MED ORDER — BISACODYL 10 MG RE SUPP
10.0000 mg | Freq: Every day | RECTAL | Status: DC | PRN
  Administered 2012-11-03 – 2012-11-07 (×3): 10 mg via RECTAL
  Filled 2012-11-03 (×3): qty 1

## 2012-11-03 NOTE — Progress Notes (Signed)
Physical Therapy Treatment Patient Details Name: [name] MRN: [id number] DOB: May 06, 1951 Today's Date: 11/03/2012 Time: 4098-1191 PT Time Calculation (min): 28 min  PT Assessment / Plan / Recommendation Comments on Treatment Session  Patient demonstrates increased activity tolerance today. Able to perform ambulation with stable vitals. Patient very fatigued at conclusion of session. With current mobility patient may benefit from CIR consult prior to d/c to address mobility and maximize independence.    Follow Up Recommendations  CIR           Equipment Recommendations  None recommended by PT    Recommendations for Other Services Rehab consult  Frequency Min 3X/week   Plan Discharge plan needs to be updated    Precautions / Restrictions Precautions Precautions: Fall   Pertinent Vitals/Pain 4/10 pain with PCA     Mobility  Bed Mobility Bed Mobility: Supine to Sit;Sitting - Scoot to Edge of Bed Supine to Sit: 1: +2 Total assist Supine to Sit: Patient Percentage: 40% Sitting - Scoot to Edge of Bed: 3: Mod assist Details for Bed Mobility Assistance: difficulty coming to upright Transfers Transfers: Sit to Stand;Stand to Sit Sit to Stand: 1: +2 Total assist Sit to Stand: Patient Percentage: 60% Stand to Sit: 3: Mod assist Details for Transfer Assistance: Patient unsteady initially with increased pain, requires assist to stand Ambulation/Gait Ambulation/Gait Assistance: 4: Min guard;4: Min Environmental consultant (Feet): 160 Feet Assistive device: Rolling walker Ambulation/Gait Assistance Details: Patient with some instability during ambulation; VCs for use and position in rw. Cues for rest breaks Gait Pattern: Step-through pattern;Trunk flexed;Narrow base of support Gait velocity: average but guarded General Gait Details: rigid gait Stairs: No    Exercises General Exercises - Lower Extremity Ankle Circles/Pumps: AROM;Both;20 reps (educated on circulation  impact)     PT Goals Acute Rehab PT Goals PT Goal Formulation: With patient Time For Goal Achievement: 11/16/12 Potential to Achieve Goals: Good Pt will go Supine/Side to Sit: with modified independence PT Goal: Supine/Side to Sit - Progress: Progressing toward goal Pt will Stand: with modified independence PT Goal: Stand - Progress: Progressing toward goal Pt will Ambulate: >150 feet;with modified independence PT Goal: Ambulate - Progress: Progressing toward goal Pt will Go Up / Down Stairs: 3-5 stairs;with supervision  Visit Information  Last PT Received On: 11/03/12 Assistance Needed: +2    Subjective Data  Subjective: I'm doing okay but it hurts Patient Stated Goal: to feel better   Cognition  Cognition Arousal/Alertness: Awake/alert Behavior During Therapy: WFL for tasks assessed/performed Overall Cognitive Status: Within Functional Limits for tasks assessed    Balance  Balance Balance Assessed: Yes Static Sitting Balance Static Sitting - Balance Support: Feet supported Static Sitting - Level of Assistance: 4: Min assist Static Sitting - Comment/# of Minutes: much improved static balance in sitting today despite pain  End of Session PT - End of Session Equipment Utilized During Treatment: Oxygen Activity Tolerance: Patient limited by fatigue;Patient limited by pain Patient left: in chair;with call bell/phone within reach Nurse Communication: Mobility status   GP     Fabio Asa 11/03/2012, 11:36 AM Charlotte Crumb, PT DPT  [phone]

## 2012-11-03 NOTE — Consult Note (Signed)
Physical Medicine and Rehabilitation Consult   Reason for Consult: Deconditioning Referring Physician:  Dr. Hart Rochester   HPI: [name] is a 62 y.o. male with history of OSA, COPD, neuropathy BLE, s/p aortic aneurysm repair 9/13 and CIR stay post op. He was admitted on  05/21 for resection and grafting of infra-renal abdominal aortic aneurysm with insertion aorto by common iliac graft-16 x 8 mm Hemashield. Post op has had anxiety as well as pain limiting mobility. NGT discontinued and NPO due to nausea and slow return of bowel function. PT/OT evaluations done yesterday and CIR recommended for progression.    Review of Systems  Constitutional: Positive for malaise/fatigue.  Respiratory: Negative for cough and shortness of breath.   Cardiovascular: Negative for chest pain.  Gastrointestinal: Positive for nausea and constipation.  Neurological: Positive for sensory change (BLE and right C4-C5 neuropathy r).  Psychiatric/Behavioral: The patient is nervous/anxious.     Past Medical History  Diagnosis Date  . ERECTILE DYSFUNCTION, ORGANIC   . Unspecified vitamin D deficiency   . HYPERLIPIDEMIA   . ASTHMA   . ALLERGIC RHINITIS   . Dissecting aortic aneurysm, thoracic 02/20/2012    s/p emergent surgical repair  . Hyperglycemia   . Anxiety   . Complication of anesthesia     neuropathy- knees to feet post surgery, due to positional in last surgery, brachial plexus   . OBSTRUCTIVE SLEEP APNEA 02/2008 sleep study    moderate & central- not able to tolerate CPAP   . Shortness of breath     related to anxiety   . GERD (gastroesophageal reflux disease)     related to diet & anxiety   . COPD (chronic obstructive pulmonary disease)     also states he has been told that he has slight emphysema   . Arthritis     OA, hands, - thumbs- specifically  . DVT (deep venous thrombosis)     coumadin- x3 months , post op  . Anemia     post op, treated /w Fe  . Hypertension     pt. followwed by Dr.  Eden Emms  . MIGRAINE HEADACHE     induced from physical , emotional stress , but sometimes occur randomly, also experiences BPPV   Past Surgical History  Procedure Laterality Date  . Nasal fx without repair    . Thoracic aortic aneurysm repair  02/20/2012    Procedure: THORACIC ASCENDING ANEURYSM REPAIR (AAA);  Surgeon: Kerin Perna, MD;  Location: [facility] OR;  Service: Open Heart Surgery;  Laterality: N/A;  . Abdominal aortic aneurysm repair N/A 11/01/2012    Procedure: ANEURYSM ABDOMINAL AORTIC REPAIR;  Surgeon: Pryor Ochoa, MD;  Location: [facility] OR;  Service: Vascular;  Laterality: N/A;  Resection and Grafting of Abdominal Aortic Aneurysm,(AORTA BI ILIAC)   Family History  Problem Relation Age of Onset  . Diabetes Mother   . Heart disease Maternal Uncle   . Heart disease Paternal Uncle   . Heart disease Cousin   . Lung cancer Cousin     2 cousins   Social History: Lives alone. Independent and back working full time since Dec '13. Per reports that he quit smoking about 8 months ago. He has never used smokeless tobacco. He reports that  drinks alcohol. He reports that he does not use illicit drugs.   Allergies  Allergen Reactions  . Albuterol Other (See Comments)    Panic attack  . Testosterone Shortness Of Breath    Medication:Androgel Pt states trouble  breathing at night  . Codeine Nausea And Vomiting   Medications Prior to Admission  Medication Sig Dispense Refill  . ALPRAZolam (XANAX) 1 MG tablet Take 0.25 mg by mouth 4 (four) times daily as needed for sleep or anxiety.      [name] aspirin EC 81 MG tablet Take 81 mg by mouth daily with lunch.      . calcium carbonate (TUMS EX) 750 MG chewable tablet Chew 1 tablet by mouth daily.      . cholecalciferol (VITAMIN D) 1000 UNITS tablet Take 2,000 Units by mouth daily.      [name] EPINEPHrine (EPI-PEN) 0.3 mg/0.3 mL DEVI Inject 0.3 mg into the muscle as needed (for anaphylaxis).       . fexofenadine (ALLEGRA) 180 MG tablet Take 90 mg by mouth 2  (two) times daily.       . fluticasone (FLONASE) 50 MCG/ACT nasal spray Place 2 sprays into the nose daily as needed for allergies.      [name] lisinopril (PRINIVIL,ZESTRIL) 5 MG tablet Take 5 mg by mouth 2 (two) times daily.      . metoprolol tartrate (LOPRESSOR) 25 MG tablet Take 25 mg by mouth 2 (two) times daily. At noon and at bedtime (after 11pm)      . montelukast (SINGULAIR) 10 MG tablet Take 5 mg by mouth 2 (two) times daily.      . Multiple Vitamin (MULTIVITAMIN WITH MINERALS) TABS Take 1 tablet by mouth daily.      [name] omega-3 acid ethyl esters (LOVAZA) 1 G capsule Take 1 g by mouth every evening.       [name] OVER THE COUNTER MEDICATION Apply 1 application topically daily as needed (for acne). Oxy 10 vanishing cream      . polyethylene glycol (MIRALAX / GLYCOLAX) packet Take 17 g by mouth daily.      . predniSONE (DELTASONE) 10 MG tablet Take 2.5 mg by mouth 2 (two) times daily as needed (for shortness of breath and vertigo from allergies).      . rosuvastatin (CRESTOR) 10 MG tablet Take 10 mg by mouth daily after supper.       . thiamine (VITAMIN B-1) 100 MG tablet Take 100 mg by mouth 2 (two) times daily at 10 AM and 5 PM.       . vitamin B-12 (CYANOCOBALAMIN) 500 MCG tablet Take 500-1,000 mcg by mouth 3 (three) times a week. Random days        Home: Home Living Lives With: Alone Available Help at Discharge: Family;Available 24 hours/day Type of Home: House Home Access: Stairs to enter [employer] of Steps: 2 Home Layout: One level Bathroom Shower/Tub: Health visitor: Standard Home Adaptive Equipment: Built-in shower seat;Grab bars in shower  Functional History: Prior Function Able to Take Stairs?: Yes Driving: Yes Vocation: Full time employment Functional Status:  Mobility: Bed Mobility Bed Mobility: Supine to Sit;Sitting - Scoot to Edge of Bed Supine to Sit: 1: +2 Total assist Supine to Sit: Patient Percentage: 40% Sitting - Scoot to Edge of Bed: 3:  Mod assist Transfers Transfers: Sit to Stand;Stand to Sit Sit to Stand: 1: +2 Total assist Sit to Stand: Patient Percentage: 60% Stand to Sit: 3: Mod assist Ambulation/Gait Ambulation/Gait Assistance: 4: Min guard;4: Min assist Ambulation Distance (Feet): 160 Feet Assistive device: Rolling walker Ambulation/Gait Assistance Details: Patient with some instability during ambulation; VCs for use and position in rw. Cues for rest breaks Gait Pattern: Step-through pattern;Trunk flexed;Narrow base of support  Gait velocity: average but guarded General Gait Details: rigid gait Stairs: No    ADL: ADL Eating/Feeding: NPO Grooming: Wash/dry hands;Wash/dry face;Brushing hair;Minimal assistance Where Assessed - Grooming: Unsupported sitting Upper Body Bathing: Maximal assistance Where Assessed - Upper Body Bathing: Unsupported sitting Lower Body Bathing: +1 Total assistance Where Assessed - Lower Body Bathing: Unsupported sitting Upper Body Dressing: Maximal assistance Where Assessed - Upper Body Dressing: Unsupported sitting Lower Body Dressing: +1 Total assistance Where Assessed - Lower Body Dressing: Unsupported sitting ADL Comments: Pt reports his extremities feeling heavy.  Pt with hx of post op neuropathy and delirium.  Cognition: Cognition Overall Cognitive Status: Within Functional Limits for tasks assessed Arousal/Alertness: Awake/alert Orientation Level: Oriented X4 Cognition Arousal/Alertness: Awake/alert Behavior During Therapy: WFL for tasks assessed/performed Overall Cognitive Status: Within Functional Limits for tasks assessed  Blood pressure 116/61, pulse 79, temperature 98 F (36.7 C), temperature source Oral, resp. rate 17, weight 101 kg (222 lb 10.6 oz), SpO2 95.00%.  Physical Exam  Nursing note and vitals reviewed. Constitutional: He is oriented to person, place, and time. He appears well-developed and well-nourished.  HENT:  Head: Normocephalic and atraumatic.   Eyes: Pupils are equal, round, and reactive to light.  Neck: Normal range of motion.  Cardiovascular: Normal rate and regular rhythm.   Pulmonary/Chest: Effort normal and breath sounds normal.  Abdominal: Soft. He exhibits no distension.  Musculoskeletal:  Discomfort with extension bilateral knees.   Neurological: He is alert and oriented to person, place, and time. No cranial nerve deficit. Coordination normal.  General weakness. Significant pain inhibition.   Skin: Skin is warm and dry.  Wounds clean, mild drainage noted.   Psychiatric: He has a normal mood and affect. His behavior is normal. Judgment and thought content normal.    Results for orders placed during the hospital encounter of 11/01/12 (from the past 24 hour(s))  AMYLASE     Status: Abnormal   Collection Time    11/03/12  2:30 AM      Result Value Range   Amylase 106 (*) 0 - 105 U/L  CBC     Status: Abnormal   Collection Time    11/03/12  2:30 AM      Result Value Range   WBC 11.1 (*) 4.0 - 10.5 K/uL   RBC 3.69 (*) 4.22 - 5.81 MIL/uL   Hemoglobin 11.5 (*) 13.0 - 17.0 g/dL   HCT 16.1 (*) 09.6 - 04.5 %   MCV 90.5  78.0 - 100.0 fL   MCH 31.2  26.0 - 34.0 pg   MCHC 34.4  30.0 - 36.0 g/dL   RDW 40.9  81.1 - 91.4 %   Platelets 90 (*) 150 - 400 K/uL  BASIC METABOLIC PANEL     Status: Abnormal   Collection Time    11/03/12  2:30 AM      Result Value Range   Sodium 131 (*) 135 - 145 mEq/L   Potassium 4.3  3.5 - 5.1 mEq/L   Chloride 99  96 - 112 mEq/L   CO2 27  19 - 32 mEq/L   Glucose, Bld 137 (*) 70 - 99 mg/dL   BUN 12  6 - 23 mg/dL   Creatinine, Ser 7.82  0.50 - 1.35 mg/dL   Calcium 7.9 (*) 8.4 - 10.5 mg/dL   GFR calc non Af Amer >90  >90 mL/min   GFR calc Af Amer >90  >90 mL/min   Dg Chest Portable 1 View  11/02/2012   *RADIOLOGY  REPORT*  Clinical Data: Postoperative examination  PORTABLE CHEST - 1 VIEW  Comparison: 11/01/2012; 04/12/2013  Findings: Grossly unchanged enlarged cardiac silhouette and  mediastinal contours post median sternotomy.  Stable positioning of support apparatus.  No pneumothorax.  Pulmonary vasculature remains indistinct with cephalization of flow.  Left perihilar heterogeneous opacities are grossly unchanged.  No new focal airspace opacities.  No definite pleural effusion.  Unchanged bones.  IMPRESSION: 1.  Stable positioning of support apparatus.  No pneumothorax. 2.  Persistent findings of mild pulmonary venous congestion and left perihilar atelectasis.   Original Report Authenticated By: Tacey Ruiz, MD   Dg Chest Portable 1 View  11/01/2012   *RADIOLOGY REPORT*  Clinical Data: Evaluate line placement  PORTABLE CHEST - 1 VIEW  Comparison: 04/12/2012  Findings: Right IJ PA catheter is noted with tip in the expected location of the main pulmonary artery.  Nasogastric tube is identified with side port below GE junction.  Heart size appears normal.  Lung volumes are low.  There is pulmonary vascular congestion. Granuloma is identified the right upper lobe.  IMPRESSION:  1.  Cardiac enlargement and pulmonary vascular congestion. 2.  PA catheter tip is in the expected location of the main pulmonary artery.   Original Report Authenticated By: Signa Kell, M.D.   Dg Abd Portable 1v  11/01/2012   *RADIOLOGY REPORT*  Clinical Data: Postop AAA repair  PORTABLE ABDOMEN - 1 VIEW  Comparison: 10/10/2012  Findings: Normal bowel gas pattern.  No abnormal bowel dilatation identified.  There is skin staples along the midline of the abdomen.  IMPRESSION:  1.  Normal bowel gas pattern.   Original Report Authenticated By: Signa Kell, M.D.    Assessment/Plan: Diagnosis:  Pain/gait difficulties resection and grafting of infra-renal abdominal aortic aneurysm with insertion aorto by common iliac graft-POD#2 1. Does the need for close, 24 hr/day medical supervision in concert with the patient's rehab needs make it unreasonable for this patient to be served in a less intensive setting?  Potentially 2. Co-Morbidities requiring supervision/potential complications: migraine,rash, right Brachial plexus injury 3. Due to bladder management, bowel management, safety, skin/wound care, disease management, medication administration, pain management and patient education, does the patient require 24 hr/day rehab nursing? Potentially 4. Does the patient require coordinated care of a physician, rehab nurse, PT (1-2 hrs/day, 5 days/week) and OT (1-2 hrs/day, 5 days/week) to address physical and functional deficits in the context of the above medical diagnosis(es)? Potentially Addressing deficits in the following areas: balance, endurance, locomotion, strength, transferring, bowel/bladder control, bathing, dressing, feeding, grooming, toileting and psychosocial support 5. Can the patient actively participate in an intensive therapy program of at least 3 hrs of therapy per day at least 5 days per week? Yes 6. The potential for patient to make measurable gains while on inpatient rehab is good and fair 7. Anticipated functional outcomes upon discharge from inpatient rehab are ?mod I to supervision with PT, mod I to supervision with OT, n/a with SLP. 8. Estimated rehab length of stay to reach the above functional goals is: ?one week if needed 9. Does the patient have adequate social supports to accommodate these discharge functional goals? Potentially 10. Anticipated D/C setting: Home 11. Anticipated post D/C treatments: HH therapy 12. Overall Rehab/Functional Prognosis: good  RECOMMENDATIONS: This patient's condition is appropriate for continued rehabilitative care in the following setting: HH vs brief admit to CIR Patient has agreed to participate in recommended program. Potentially Note that insurance prior authorization may be required for reimbursement  for recommended care.  Comment: I suspect that once his pain improves, he will improve quite a bit functionally. He walked 160 ft most recently  with PT despite his pain. Rehab RN to follow up.   Ranelle Oyster, MD, [place] [facility]     11/03/2012

## 2012-11-03 NOTE — Progress Notes (Signed)
Rehab Admissions Coordinator Note:  Patient was screened by Brock Ra for appropriateness for an Inpatient Acute Rehab Consult.  At this time, we are recommending Inpatient Rehab consult.  Brock Ra 11/03/2012, 12:52 PM  I can be reached at [phone].

## 2012-11-03 NOTE — Progress Notes (Addendum)
[facility] Progress Note  11/03/2012 8:07 AM POD 2  Subjective:  Feeling rough this morning; states he did have some nausea this morning earlier.  Tm 100.3 now afebrile HR 60's-90's regular 100-120's systolic 98% 2LO2NC  Filed Vitals:   11/03/12 0739  BP:   Pulse:   Temp: 98.6 F (37 C)  Resp:     Physical Exam: Cardiac:  RRR Lungs:  CTAB Abdomen:  Soft NT/ND occasional BS; no BM/flatus Incisions:  Covered-bandage is dry Extremities:  Palpable bilateral femoral pulses as well as palpable bilateral DP pulses.  CBC    Component Value Date/Time   WBC 11.1* 11/03/2012 0230   RBC 3.69* 11/03/2012 0230   HGB 11.5* 11/03/2012 0230   HCT 33.4* 11/03/2012 0230   PLT 90* 11/03/2012 0230   MCV 90.5 11/03/2012 0230   MCH 31.2 11/03/2012 0230   MCHC 34.4 11/03/2012 0230   RDW 13.8 11/03/2012 0230   LYMPHSABS 2.5 04/02/2012 1638   MONOABS 0.9 04/02/2012 1638   EOSABS 0.2 04/02/2012 1638   BASOSABS 0.1 04/02/2012 1638    BMET    Component Value Date/Time   NA 131* 11/03/2012 0230   K 4.3 11/03/2012 0230   CL 99 11/03/2012 0230   CO2 27 11/03/2012 0230   GLUCOSE 137* 11/03/2012 0230   BUN 12 11/03/2012 0230   CREATININE 0.76 11/03/2012 0230   CREATININE 0.84 06/09/2012 1515   CALCIUM 7.9* 11/03/2012 0230   GFRNONAA >90 11/03/2012 0230   GFRAA >90 11/03/2012 0230   Amylase    Component Value Date/Time   AMYLASE 106* 11/03/2012 0230      INR    Component Value Date/Time   INR 1.19 11/01/2012 1305   INR 2.50 04/24/2012 1134     Intake/Output Summary (Last 24 hours) at 11/03/12 1308 Last data filed at 11/03/12 0736  Gross per 24 hour  Intake 2469.52 ml  Output   1530 ml  Net 939.52 ml     Assessment/Plan:  62 y.o. male is s/p  Resection and grafting of infrarenal abdominal aortic aneurysm with insertion aorto by common iliac graft-16 x 8 mm Hemashield    POD 2  -pt doing well. -bowel function slow to return-continue NPO for now -pain is more  controlled with morphine PCA -pt on lovenox for DVT prophylaxis -amylase is down to 106 today from 231 yesterday -continue mobilization and OOB -continue to encourage IS 10x/hr while awake   Doreatha Massed, PA-C [facility] [phone] 11/03/2012 8:07 AM  Agree with above assessment Pain management much better with PCA Out of bed in a chair Abdomen relatively soft with excellent DP pulses bilaterally 2+ No flatus or bowel movement  Plan transfer 2000 Continue to increase mobilization Strict n.p.o. with Dulcolax suppository today DC Foley catheter in early a.m.   Patient doing very well-will begin liquids in the next few days when bowel function begins to return

## 2012-11-03 NOTE — Significant Event (Signed)
1140am-patient arrived safely to 2019, settled in chair, placed on 2000 telemetry monitor. VS stable prior and during the transfer. Patient's belongings (eye-glasses and a black flip phone) are with patient in 2019. Patient's daughter Diannia Ruder is at bedside. Report given to receiving RN, Sherri.   Carliss Porcaro, Charity fundraiser.

## 2012-11-04 MED ORDER — KETOROLAC TROMETHAMINE 30 MG/ML IJ SOLN
30.0000 mg | Freq: Four times a day (QID) | INTRAMUSCULAR | Status: AC | PRN
  Administered 2012-11-04 (×2): 30 mg via INTRAVENOUS
  Filled 2012-11-04 (×3): qty 1

## 2012-11-04 NOTE — Progress Notes (Signed)
I agree with the above The patient has had 2 episodes of flatus, therefore I will start a clear liquid diet Continue with mobilization The patient's midline incision is healing nicely. He has palpable femoral pulses. We'll add Toradol for pain control  [name]

## 2012-11-04 NOTE — Progress Notes (Signed)
Pt ambulated 300 feet with RN pushing wheelchair; steady gait noted; pt assisted back to room to chair; 2 rest breaks needed; pt with pain during ambulation; pt encouraged to use PCA; will cont. To monitor.

## 2012-11-04 NOTE — Progress Notes (Signed)
[facility] Progress Note  11/04/2012 7:41 AM POD 3  Subjective:  "still having pain"  Tm 99.7 now afebrile HR 70's-80's 100-120's systolic 98% RA  Filed Vitals:   11/04/12 0509  BP: 113/50  Pulse: 71  Temp: 98.9 F (37.2 C)  Resp: 19    Physical Exam: Cardiac:  regular Lungs:  Non labored Abdomen:  Soft NT/ND Incisions:  C/d/i with staples in tact Extremities:  + palpable femoral pulses bilaterally  CBC    Component Value Date/Time   WBC 11.1* 11/03/2012 0230   RBC 3.69* 11/03/2012 0230   HGB 11.5* 11/03/2012 0230   HCT 33.4* 11/03/2012 0230   PLT 90* 11/03/2012 0230   MCV 90.5 11/03/2012 0230   MCH 31.2 11/03/2012 0230   MCHC 34.4 11/03/2012 0230   RDW 13.8 11/03/2012 0230   LYMPHSABS 2.5 04/02/2012 1638   MONOABS 0.9 04/02/2012 1638   EOSABS 0.2 04/02/2012 1638   BASOSABS 0.1 04/02/2012 1638    BMET    Component Value Date/Time   NA 131* 11/03/2012 0230   K 4.3 11/03/2012 0230   CL 99 11/03/2012 0230   CO2 27 11/03/2012 0230   GLUCOSE 137* 11/03/2012 0230   BUN 12 11/03/2012 0230   CREATININE 0.76 11/03/2012 0230   CREATININE 0.84 06/09/2012 1515   CALCIUM 7.9* 11/03/2012 0230   GFRNONAA >90 11/03/2012 0230   GFRAA >90 11/03/2012 0230    INR    Component Value Date/Time   INR 1.19 11/01/2012 1305   INR 2.50 04/24/2012 1134   No new labs today  Intake/Output Summary (Last 24 hours) at 11/04/12 0741 Last data filed at 11/04/12 0500  Gross per 24 hour  Intake 516.23 ml  Output   1570 ml  Net -1053.77 ml     Assessment/Plan:  62 y.o. male is s/p  Resection and grafting of infrarenal abdominal aortic aneurysm with insertion aorto by common iliac graft-16 x 8 mm Hemashield    POD 3  -still with significant pain-will try toradol today as creatinine is normal -pt having flatus now-will start clear liquids today -decrease IVF to 50 cc/hr -mobilize/ambulate with RN; out of bed to chair tid with meals  -will start toradol today to  help with pain control as creatinine is normal; continue PCA today-will try to d/c tomorrow -possible CIR at discharge  Doreatha Massed, PA-C [facility] [phone] 11/04/2012 7:41 AM

## 2012-11-04 NOTE — Progress Notes (Signed)
Pt given IV Toradol at this time; will cont. To monitor.

## 2012-11-05 MED ORDER — ATORVASTATIN CALCIUM 10 MG PO TABS
10.0000 mg | ORAL_TABLET | Freq: Every day | ORAL | Status: DC
  Administered 2012-11-05 – 2012-11-06 (×2): 10 mg via ORAL
  Filled 2012-11-05 (×3): qty 1

## 2012-11-05 MED ORDER — ONDANSETRON HCL 4 MG/2ML IJ SOLN
4.0000 mg | Freq: Three times a day (TID) | INTRAMUSCULAR | Status: DC | PRN
  Administered 2012-11-05: 4 mg via INTRAVENOUS
  Filled 2012-11-05: qty 2

## 2012-11-05 MED ORDER — LORATADINE 10 MG PO TABS
10.0000 mg | ORAL_TABLET | Freq: Every day | ORAL | Status: DC
  Administered 2012-11-05 – 2012-11-07 (×3): 10 mg via ORAL
  Filled 2012-11-05 (×3): qty 1

## 2012-11-05 MED ORDER — FLUTICASONE PROPIONATE 50 MCG/ACT NA SUSP
2.0000 | Freq: Every day | NASAL | Status: DC | PRN
  Administered 2012-11-06: 2 via NASAL
  Filled 2012-11-05 (×2): qty 16

## 2012-11-05 MED ORDER — ASPIRIN EC 81 MG PO TBEC
81.0000 mg | DELAYED_RELEASE_TABLET | Freq: Every day | ORAL | Status: DC
  Administered 2012-11-05 – 2012-11-07 (×3): 81 mg via ORAL
  Filled 2012-11-05 (×3): qty 1

## 2012-11-05 MED ORDER — ALPRAZOLAM 0.25 MG PO TABS
0.2500 mg | ORAL_TABLET | Freq: Four times a day (QID) | ORAL | Status: DC | PRN
Start: 2012-11-05 — End: 2012-11-07
  Administered 2012-11-05: 0.25 mg via ORAL
  Filled 2012-11-05 (×2): qty 1

## 2012-11-05 MED ORDER — OXYCODONE HCL 5 MG PO TABS
5.0000 mg | ORAL_TABLET | ORAL | Status: DC | PRN
  Administered 2012-11-05 – 2012-11-07 (×5): 5 mg via ORAL
  Filled 2012-11-05 (×7): qty 1

## 2012-11-05 MED ORDER — ONDANSETRON HCL 4 MG PO TABS: 4.0000 mg | ORAL_TABLET | Freq: Three times a day (TID) | ORAL | Status: DC | PRN

## 2012-11-05 MED ORDER — VITAMIN D3 25 MCG (1000 UNIT) PO TABS
2000.0000 [IU] | ORAL_TABLET | Freq: Every day | ORAL | Status: DC
  Administered 2012-11-05 – 2012-11-07 (×3): 2000 [IU] via ORAL
  Filled 2012-11-05 (×3): qty 2

## 2012-11-05 NOTE — Progress Notes (Signed)
Pt up to BR at this time; + gas, no BM yet; will cont. To monitor.

## 2012-11-05 NOTE — Progress Notes (Signed)
[facility] Progress Note  11/05/2012 10:05 AM POD 4  Subjective:  "toradol is a miracle drug"  Afebrile VSS 98% RA  Filed Vitals:   11/05/12 0514  BP: 111/51  Pulse: 91  Temp: 97.8 F (36.6 C)  Resp: 20    Physical Exam: Cardiac:  regular Lungs:  Non labored Abdomen:  Soft NT/ND + flatus Incisions:  C/d/i with staples in tact   CBC    Component Value Date/Time   WBC 11.1* 11/03/2012 0230   RBC 3.69* 11/03/2012 0230   HGB 11.5* 11/03/2012 0230   HCT 33.4* 11/03/2012 0230   PLT 90* 11/03/2012 0230   MCV 90.5 11/03/2012 0230   MCH 31.2 11/03/2012 0230   MCHC 34.4 11/03/2012 0230   RDW 13.8 11/03/2012 0230   LYMPHSABS 2.5 04/02/2012 1638   MONOABS 0.9 04/02/2012 1638   EOSABS 0.2 04/02/2012 1638   BASOSABS 0.1 04/02/2012 1638    BMET    Component Value Date/Time   NA 131* 11/03/2012 0230   K 4.3 11/03/2012 0230   CL 99 11/03/2012 0230   CO2 27 11/03/2012 0230   GLUCOSE 137* 11/03/2012 0230   BUN 12 11/03/2012 0230   CREATININE 0.76 11/03/2012 0230   CREATININE 0.84 06/09/2012 1515   CALCIUM 7.9* 11/03/2012 0230   GFRNONAA >90 11/03/2012 0230   GFRAA >90 11/03/2012 0230    INR    Component Value Date/Time   INR 1.19 11/01/2012 1305   INR 2.50 04/24/2012 1134     Intake/Output Summary (Last 24 hours) at 11/05/12 1005 Last data filed at 11/05/12 0900  Gross per 24 hour  Intake 2306.68 ml  Output   1540 ml  Net 766.68 ml     Assessment/Plan:  62 y.o. male is s/p  Resection and grafting of infrarenal abdominal aortic aneurysm with insertion aorto by common iliac graft-16 x 8 mm Hemashield   POD 3  -pt doing better today -pain much better controlled with toradol.  Will start po OxyIR -check labs in am -pt tolerating clear liquids-will advance diet today -restart home meds.  Will hold on starting BP medications as pt's systolic is running from 100-100's systolic -d/c IVF   Doreatha Massed, PA-C [facility] [phone] 11/05/2012 10:05 AM

## 2012-11-05 NOTE — Progress Notes (Signed)
Pt given 4mg  IV Zofran at this time; will cont. To monitor.

## 2012-11-05 NOTE — Progress Notes (Signed)
Pt walked 1650 ft with RW on RA. Steady gait and activity tolerated well. Pt returned to his room and back to bed. 02 sat 95%.  Denies any SOB. Will cont to monitor.

## 2012-11-05 NOTE — Progress Notes (Signed)
Morphine PCA 2ml wasted at this time; verified waste with Ander Purpura, RN. [name]

## 2012-11-05 NOTE — Progress Notes (Signed)
Pt called RN to say that while up to BR to attempt to have BM, pt wiped buttucks, no BM, but a small amount of bright, red blood was seen on toilet paper; pt embarressed to even tell RN about the blood; RN insisted on examining rectum, but pt refused to let RN exam; PA notified; no new orders; since bright red blood noticed, may be related to hemorrhoid; pt denies pain; will cont. To monitor.

## 2012-11-05 NOTE — Progress Notes (Signed)
Pt given Dulcolax supp at this time; no BM since surgery; will cont. To monitor.

## 2012-11-05 NOTE — Progress Notes (Signed)
Pt given Xanax at this per pt request; will cont. To monitor.

## 2012-11-05 NOTE — Progress Notes (Signed)
Morphine PCA turned off during night; pt denied need for PCA; pt tolerating CL diet well; pt taking adequate PO liquids a this time; IV SL at this time; will cont. To monitor.

## 2012-11-05 NOTE — Progress Notes (Signed)
Pt c/o nausea; no meds ordered; PA paged; will await callback.

## 2012-11-05 NOTE — Progress Notes (Signed)
Pt up ambulating in hallway at this time with daughter; steady gait noted; pt ambulating with use of rolling walker; will cont. To monitor.

## 2012-11-05 NOTE — Progress Notes (Signed)
I agree with the above  The patient pain was significantly better controlled with Toradol. He was able to a leg yesterday. He did not use his PCA overnight. He tolerated his diet.  IV fluids will be discontinued today and the patient be advanced to a full diet. Continue with ambulation and pain control. I will check renal function tomorrow given that I started him on Toradol. Discharge in approximately 2 days.  Durene Cal

## 2012-11-06 LAB — CBC
HCT: 26.2 % — ABNORMAL LOW (ref 39.0–52.0)
Hemoglobin: 8.9 g/dL — ABNORMAL LOW (ref 13.0–17.0)
MCH: 30.6 pg (ref 26.0–34.0)
MCHC: 34 g/dL (ref 30.0–36.0)
WBC: 5.6 10*3/uL (ref 4.0–10.5)

## 2012-11-06 LAB — BASIC METABOLIC PANEL
BUN: 12 mg/dL (ref 6–23)
CO2: 26 mEq/L (ref 19–32)
Calcium: 8.3 mg/dL — ABNORMAL LOW (ref 8.4–10.5)
GFR calc non Af Amer: 90 mL/min — ABNORMAL LOW (ref >90)
Glucose, Bld: 117 mg/dL — ABNORMAL HIGH (ref 70–99)

## 2012-11-06 MED ORDER — TAMSULOSIN HCL 0.4 MG PO CAPS
0.4000 mg | ORAL_CAPSULE | Freq: Every day | ORAL | Status: DC
  Administered 2012-11-06 – 2012-11-07 (×2): 0.4 mg via ORAL
  Filled 2012-11-06 (×2): qty 1

## 2012-11-06 MED ORDER — POLYETHYLENE GLYCOL 3350 17 G PO PACK
17.0000 g | PACK | Freq: Every day | ORAL | Status: DC
  Administered 2012-11-06 – 2012-11-07 (×2): 17 g via ORAL
  Filled 2012-11-06 (×2): qty 1

## 2012-11-06 NOTE — Progress Notes (Signed)
Occupational Therapy Treatment Patient Details Name: [name] MRN: [id number] DOB: 02/22/1951 Today's Date: 11/06/2012 Time: 7829-5621 OT Time Calculation (min): 38 min  OT Assessment / Plan / Recommendation Comments on Treatment Session Pt making steady progress. C/o dizziness at times after being up and ambulating, however, BP WNL. Discussed home safety and the need for initial 24/7 S. Pt will benefit from skilled OT services to facilitate D/C home with HHOT due to below deficits.    Follow Up Recommendations  Home health OT    Barriers to Discharge       Equipment Recommendations  None recommended by OT    Recommendations for Other Services    Frequency Min 2X/week   Plan Discharge plan needs to be updated    Precautions / Restrictions Restrictions Weight Bearing Restrictions: No   Pertinent Vitals/Pain Vitals WNL. Min c/o abdominal pain.    ADL  Eating/Feeding: Modified independent Grooming: Supervision/safety Where Assessed - Grooming: Unsupported standing Upper Body Bathing: Supervision/safety;Set up Where Assessed - Upper Body Bathing: Unsupported sit to stand Toilet Transfer: Supervision/safety;Simulated Toilet Transfer Method: Sit to stand Tub/Shower Transfer: Simulated;Min guard Tub/Shower Transfer Method: Ambulating Transfers/Ambulation Related to ADLs: S. using RW. c/o dizziness at imes.BP WFL ADL Comments: Improved independence with ADL. Limited overall by decreased endurnace. Rec to pt to use shower bench and have 24/7 S initially uon D/C. Also discussed home safety.    OT Diagnosis:    OT Problem List:   OT Treatment Interventions:     OT Goals Acute Rehab OT Goals OT Goal Formulation: With patient Time For Goal Achievement: 11/16/12 Potential to Achieve Goals: Good ADL Goals Pt Will Perform Grooming: with supervision;Standing at sink ADL Goal: Grooming - Progress: Met Pt Will Perform Upper Body Bathing: with modified independence;Sitting at  sink ADL Goal: Upper Body Bathing - Progress: Progressing toward goals Pt Will Perform Lower Body Bathing: with supervision;Sit to stand from chair;Sitting at sink;Standing at sink ADL Goal: Lower Body Bathing - Progress: Progressing toward goals Pt Will Perform Upper Body Dressing: with set-up;Sitting, bed ADL Goal: Upper Body Dressing - Progress: Progressing toward goals Pt Will Perform Lower Body Dressing: with supervision;Sit to stand from bed ADL Goal: Lower Body Dressing - Progress: Progressing toward goals Pt Will Transfer to Toilet: with supervision;Ambulation;with DME;3-in-1 ADL Goal: Toilet Transfer - Progress: Met Pt Will Perform Toileting - Clothing Manipulation: with supervision;Standing ADL Goal: Toileting - Clothing Manipulation - Progress: Met Pt Will Perform Toileting - Hygiene: Independently;Sitting on 3-in-1 or toilet ADL Goal: Toileting - Hygiene - Progress: Met  Visit Information  Last OT Received On: 11/06/12 Assistance Needed: +1    Subjective Data      Prior Functioning       Cognition  Cognition Arousal/Alertness: Awake/alert Behavior During Therapy: WFL for tasks assessed/performed Overall Cognitive Status: Impaired/Different from baseline Area of Impairment: Safety/judgement;Awareness Safety/Judgement: Decreased awareness of safety;Decreased awareness of deficits Awareness: Emergent General Comments: patient does not recognize balance deficits and need for use of rw and supervision/guard    Mobility  Bed Mobility Bed Mobility: Supine to Sit;Sitting - Scoot to Edge of Bed Supine to Sit: 6: Modified independent (Device/Increase time) Sitting - Scoot to Edge of Bed: 6: Modified independent (Device/Increase time) Details for Bed Mobility Assistance: Increased time to perform Transfers Sit to Stand: 5: Supervision Stand to Sit: 5: Supervision Details for Transfer Assistance: VCs for hand placement and controlled movement    Exercises      Balance  Balance Balance Assessed: Yes Static  Standing Balance Static Standing - Balance Support: Left upper extremity supported;During functional activity Static Standing - Level of Assistance: 5: Stand by assistance Static Standing - Comment/# of Minutes: Patient able to perform standing activities with close supervision High Level Balance High Level Balance Activites: Other (comment) High Level Balance Comments: S while brushng teeth   End of Session OT - End of Session Equipment Utilized During Treatment: Gait belt Activity Tolerance: Patient tolerated treatment well Patient left: in chair;with call bell/phone within reach Nurse Communication: Mobility status  GO     Macoy Rodwell,HILLARY 11/06/2012, 11:45 AM Luisa Dago, OTR/L  [phone] 11/06/2012

## 2012-11-06 NOTE — Progress Notes (Signed)
Physical Therapy Treatment Patient Details Name: [name] MRN: [id number] DOB: March 08, 1951 Today's Date: 11/06/2012 Time: 4098-1191 PT Time Calculation (min): 40 min  PT Assessment / Plan / Recommendation Comments on Treatment Session  Pt demonstrates steady progress towards PT goals at this time. Patient does demonstrate balance deficits as indicated. Spoke with patient at length regarding need for assistance when OOB. Pt with decreased awareness of need for assist. Feel patient has progressed well enough to d/c home with HHPT and 24/hr supervision/ assist.  Instructed patient on necessity for use of rw at this time. Patient had some questions regarding activity at home. Addressed concerns and educated patient on expectations for HHPT. Additionally, advised patient to call for assist when needing to get up. Will continue to see patient as indicated.    Follow Up Recommendations  Home health PT;Supervision/Assistance - 24 hour                 Frequency Min 3X/week   Plan Discharge plan needs to be updated    Precautions / Restrictions Restrictions Weight Bearing Restrictions: No   Pertinent Vitals/Pain 3/10    Mobility  Bed Mobility Bed Mobility: Supine to Sit;Sitting - Scoot to Edge of Bed Supine to Sit: 5: Supervision Sitting - Scoot to Edge of Bed: 5: Supervision Details for Bed Mobility Assistance: Increased time to perform Transfers Transfers: Sit to Stand;Stand to Sit Sit to Stand: 5: Supervision Stand to Sit: 5: Supervision Details for Transfer Assistance: VCs for hand placement and controlled movement Ambulation/Gait Ambulation/Gait Assistance: 4: Min guard;4: Min assist Ambulation Distance (Feet): 450 Feet Assistive device: Rolling walker Ambulation/Gait Assistance Details: Patient mainly min guard with VCs for use and position within rw; occassional balance checks with occassional min assist to prevent LOB. Gait Pattern: Step-through pattern;Trunk  flexed;Narrow base of support Gait velocity: decreased General Gait Details: patient with2 bouts of "wooziness" with ambulation requiring rest break to resolve.   Stairs: Yes Stairs Assistance: 4: Min assist;1: +2 Total assist Stairs Assistance Details (indicate cue type and reason): Initially Min assist with cues for step to; upon descent patient became very anxious after "looking down" and required +2 assist to safely descend stairs. Stair Management Technique: One rail Right;Step to pattern;Forwards Number of Stairs: 6    Exercises     PT Diagnosis:    PT Problem List:   PT Treatment Interventions:     PT Goals Acute Rehab PT Goals PT Goal Formulation: With patient Time For Goal Achievement: 11/16/12 Potential to Achieve Goals: Good Pt will go Supine/Side to Sit: with modified independence PT Goal: Supine/Side to Sit - Progress: Progressing toward goal Pt will Stand: with modified independence PT Goal: Stand - Progress: Progressing toward goal Pt will Ambulate: >150 feet;with modified independence PT Goal: Ambulate - Progress: Progressing toward goal Pt will Go Up / Down Stairs: 3-5 stairs;with supervision PT Goal: Up/Down Stairs - Progress: Progressing toward goal  Visit Information  Last PT Received On: 11/06/12 Assistance Needed: +1    Subjective Data  Subjective: I feel like my timing is a bit off Patient Stated Goal: to feel better   Cognition  Cognition Arousal/Alertness: Awake/alert Behavior During Therapy: WFL for tasks assessed/performed Overall Cognitive Status: Impaired/Different from baseline Area of Impairment: Safety/judgement;Awareness Safety/Judgement: Decreased awareness of safety;Decreased awareness of deficits General Comments: patient does not recognize balance deficits and need for use of rw and supervision/guard    Balance  Balance Balance Assessed: Yes Static Standing Balance Static Standing - Balance Support: Left  upper extremity  supported;During functional activity Static Standing - Level of Assistance: 5: Stand by assistance Static Standing - Comment/# of Minutes: Patient able to perform standing activities with close supervision  End of Session PT - End of Session Equipment Utilized During Treatment: Gait belt Activity Tolerance: Patient limited by fatigue Patient left: in chair;with call bell/phone within reach Nurse Communication: Mobility status   GP     Fabio Asa 11/06/2012, 11:41 AM Charlotte Crumb, PT DPT  [phone]

## 2012-11-06 NOTE — Progress Notes (Signed)
I agree with the above Tolerating his diet. He continues to have flatus. We'll add back MiraLAX as she is taking this at home. Continued ambulation. Midline incision is well-healed. Femoral pulses are palpable. Anticipate discharge tomorrow  Durene Cal

## 2012-11-06 NOTE — Progress Notes (Signed)
Note pt w/ good progress.  He does not meet criteria for CIR.  CIR will sign off.  [phone]

## 2012-11-06 NOTE — Progress Notes (Signed)
Pt ambulated 600 ft with rolling walker. Standby assist only. Tolerated ambulation well. Dion Saucier

## 2012-11-06 NOTE — Progress Notes (Signed)
[facility] Progress Note  11/06/2012 8:08 AM POD 4  Subjective:  No complaints  Afebrile x 24 hrs VSS  Filed Vitals:   11/06/12 0336  BP: 120/67  Pulse: 53  Temp: 98.5 F (36.9 C)  Resp: 16    Physical Exam: Cardiac:  regular Lungs:  Non labored on room air Abdomen:  Soft NT/ND + flatus; -BM Incisions:  C/d/i with staples in tact   CBC    Component Value Date/Time   WBC 5.6 11/06/2012 0410   RBC 2.91* 11/06/2012 0410   HGB 8.9* 11/06/2012 0410   HCT 26.2* 11/06/2012 0410   PLT 127* 11/06/2012 0410   MCV 90.0 11/06/2012 0410   MCH 30.6 11/06/2012 0410   MCHC 34.0 11/06/2012 0410   RDW 14.1 11/06/2012 0410   LYMPHSABS 2.5 04/02/2012 1638   MONOABS 0.9 04/02/2012 1638   EOSABS 0.2 04/02/2012 1638   BASOSABS 0.1 04/02/2012 1638    BMET    Component Value Date/Time   NA 138 11/06/2012 0410   K 3.7 11/06/2012 0410   CL 103 11/06/2012 0410   CO2 26 11/06/2012 0410   GLUCOSE 117* 11/06/2012 0410   BUN 12 11/06/2012 0410   CREATININE 0.94 11/06/2012 0410   CREATININE 0.84 06/09/2012 1515   CALCIUM 8.3* 11/06/2012 0410   GFRNONAA 90* 11/06/2012 0410   GFRAA >90 11/06/2012 0410    INR    Component Value Date/Time   INR 1.19 11/01/2012 1305   INR 2.50 04/24/2012 1134     Intake/Output Summary (Last 24 hours) at 11/06/12 0454 Last data filed at 11/05/12 1049  Gross per 24 hour  Intake    720 ml  Output    200 ml  Net    520 ml     Assessment/Plan:  62 y.o. male is s/p  Resection and grafting of infrarenal abdominal aortic aneurysm with insertion aorto by common iliac graft-16 x 8 mm Hemashield   POD 4  -pt doing well this am -pt was on miralax at home-will restart today -pt having flatus, but still no BM despite dulcolax yesterday.  Will give dulcolax today as well. -tolerating diet with exception of some nausea yesterday afternoon that has improved. -pt ambulating well -anticipate discharge tomorrow -will most likely restart BP meds back  tomorrow -pt did state he was having some difficulty voiding-will start Flomax   Doreatha Massed, PA-C [facility] [phone] 11/06/2012 8:08 AM

## 2012-11-06 NOTE — Progress Notes (Signed)
Pt ambulated 300 ft with rolling walker with daughter. Tolerated ambulation well. Back to bed to rest. Chestine Spore, Bary Richard

## 2012-11-07 ENCOUNTER — Telehealth: Payer: Self-pay | Admitting: Vascular Surgery

## 2012-11-07 LAB — HEMOGLOBIN AND HEMATOCRIT, BLOOD: HCT: 31.3 % — ABNORMAL LOW (ref 39.0–52.0)

## 2012-11-07 MED ORDER — METOPROLOL TARTRATE 25 MG PO TABS
25.0000 mg | ORAL_TABLET | Freq: Every day | ORAL | Status: AC
  Administered 2012-11-07: 25 mg via ORAL
  Filled 2012-11-07: qty 1

## 2012-11-07 MED ORDER — OXYCODONE HCL 5 MG PO TABS: 5.0000 mg | ORAL_TABLET | ORAL | Status: DC | PRN

## 2012-11-07 MED ORDER — FLEET ENEMA 7-19 GM/118ML RE ENEM
1.0000 | ENEMA | Freq: Once | RECTAL | Status: DC
  Filled 2012-11-07: qty 1

## 2012-11-07 MED ORDER — LISINOPRIL 5 MG PO TABS
5.0000 mg | ORAL_TABLET | Freq: Two times a day (BID) | ORAL | Status: DC
  Administered 2012-11-07: 5 mg via ORAL
  Filled 2012-11-07 (×3): qty 1

## 2012-11-07 NOTE — Progress Notes (Signed)
Walker delivered to pt- and pt ready for discharge with his daughter. Will provide wheelchair escort to car.

## 2012-11-07 NOTE — Telephone Encounter (Addendum)
Message copied by Rosalyn Charters on Tue Nov 07, 2012  1:10 PM ------      Message from: Melene Plan      Created: Tue Nov 07, 2012 11:14 AM       NEEDS STAPLES OUT THEN.NO STUDIES SINCE OPEN      ----- Message -----         From: Lars Mage, PA-C         Sent: 11/07/2012  11:10 AM           To: Melene Plan, RN            Open AAA follow up in 2-3 weeks Dr. Hart Rochester       ------  notified patient of fu appt. with dr. Hart Rochester on 11-28-12 9:45 am

## 2012-11-07 NOTE — Discharge Summary (Signed)
[facility] Discharge Summary   Patient ID:  [name] MRN: [id number] DOB/AGE: 01/27/1951 62 y.o.  Admit date: 11/01/2012 Discharge date: 11/07/2012 Date of Surgery: 10/27/2012 - 11/01/2012 Surgeon: Surgeon(s): Pryor Ochoa, MD Larina Earthly, MD  Admission Diagnosis: Abdominal Aortic Aneurysm Status post Aortic Dissection  Discharge Diagnoses:  Abdominal Aortic Aneurysm Status post Aortic Dissection  Secondary Diagnoses: Past Medical History  Diagnosis Date  . ERECTILE DYSFUNCTION, ORGANIC   . Unspecified vitamin D deficiency   . HYPERLIPIDEMIA   . ASTHMA   . ALLERGIC RHINITIS   . Dissecting aortic aneurysm, thoracic 02/20/2012    s/p emergent surgical repair  . Hyperglycemia   . Anxiety   . Complication of anesthesia     neuropathy- knees to feet post surgery, due to positional in last surgery, brachial plexus   . OBSTRUCTIVE SLEEP APNEA 02/2008 sleep study    moderate & central- not able to tolerate CPAP   . Shortness of breath     related to anxiety   . GERD (gastroesophageal reflux disease)     related to diet & anxiety   . COPD (chronic obstructive pulmonary disease)     also states he has been told that he has slight emphysema   . Arthritis     OA, hands, - thumbs- specifically  . DVT (deep venous thrombosis)     coumadin- x3 months , post op  . Anemia     post op, treated /w Fe  . Hypertension     pt. followwed by Dr. Eden Emms  . MIGRAINE HEADACHE     induced from physical , emotional stress , but sometimes occur randomly, also experiences BPPV    Procedure(s): ANEURYSM ABDOMINAL AORTIC REPAIR  Discharged Condition: good  HPI: 62 year old male status post repair of a type A aortic dissection by Dr. Morton Peters in September of 2013. He was also found to have a 6.5 cm infrarenal abdominal aortic aneurysm. The dissection extended down into the infrarenal aneurysm and into the left renal artery. Patient has recovered nicely from the  aortic surgery and now returns for discussion regarding his infrarenal aortic aneurysm. He had a CT angiogram performed last week which I have reviewed with multiple colleagues. He does not appear to be a candidate for aortic stent grafting. He does have a true and false lumen extending below the renal arteries and the left renal artery was perfused through both the true and false lumen. The right renal artery is perfused through the true lumen reservoir measures approximately 6.7 cm in maximum diameter.    On 11/01/2012 Dr. Hart Rochester performed Resection and grafting of infrarenal abdominal aortic aneurysm with insertion aorto by common iliac graft-16 x 8 mm Hemashield.  Dificult pain management he was placed on a morphine PCA.  Post-op day 2 he was transferred to 2000.  His diet was advanced and physical therapy was ordered for increased mobility.  After daily doses of dulcolax he had a small BM and normal bowel sounds.  His plan of care was discussed with his daughter.  He is being discharged home with home health PT and a rolling walker.    Hospital Course:  DANTRELL SCHERTZER is a 62 y.o. male is S/P  Procedure(s): ANEURYSM ABDOMINAL AORTIC REPAIR Extubated: POD # 0 Physical exam: Abdominal incision is clean and dry.  Positive bowel sounds with soft abdomin. Palpable DP/PT pulses bilateral feet. Post-op wounds clean, dry, intact or healing well Pt. Ambulating, voiding and taking  PO diet without difficulty. Pt pain controlled with PO pain meds. Labs as below Complications: Pain control  Consults:    Ranelle Oyster, MD Physician PT/OT  Significant Diagnostic Studies: CBC Lab Results  Component Value Date   WBC 5.6 11/06/2012   HGB 10.5* 11/07/2012   HCT 31.3* 11/07/2012   MCV 90.0 11/06/2012   PLT 127* 11/06/2012    BMET    Component Value Date/Time   NA 138 11/06/2012 0410   K 3.7 11/06/2012 0410   CL 103 11/06/2012 0410   CO2 26 11/06/2012 0410   GLUCOSE 117* 11/06/2012 0410   BUN 12  11/06/2012 0410   CREATININE 0.94 11/06/2012 0410   CREATININE 0.84 06/09/2012 1515   CALCIUM 8.3* 11/06/2012 0410   GFRNONAA 90* 11/06/2012 0410   GFRAA >90 11/06/2012 0410   COAG Lab Results  Component Value Date   INR 1.19 11/01/2012   INR 1.10 10/27/2012   INR 2.50 04/24/2012     Disposition:  Discharge to :Home Discharge Orders   Future Orders Complete By Expires     Call MD for:  redness, tenderness, or signs of infection (pain, swelling, bleeding, redness, odor or green/yellow discharge around incision site)  As directed     Call MD for:  severe or increased pain, loss or decreased feeling  in affected limb(s)  As directed     Call MD for:  temperature >100.5  As directed     Driving Restrictions  As directed     Comments:      No driving for 12 weeks    Lifting restrictions  As directed     Comments:      No lifting for 12 weeks    Resume previous diet  As directed     Walker rolling  As directed         Medication List    TAKE these medications       ALPRAZolam 1 MG tablet  Commonly known as:  XANAX  Take 0.25 mg by mouth 4 (four) times daily as needed for sleep or anxiety.     aspirin EC 81 MG tablet  Take 81 mg by mouth daily with lunch.     calcium carbonate 750 MG chewable tablet  Commonly known as:  TUMS EX  Chew 1 tablet by mouth daily.     cholecalciferol 1000 UNITS tablet  Commonly known as:  VITAMIN D  Take 2,000 Units by mouth daily.     EPINEPHrine 0.3 mg/0.3 mL Devi  Commonly known as:  EPI-PEN  Inject 0.3 mg into the muscle as needed (for anaphylaxis).     fexofenadine 180 MG tablet  Commonly known as:  ALLEGRA  Take 90 mg by mouth 2 (two) times daily.     fluticasone 50 MCG/ACT nasal spray  Commonly known as:  FLONASE  Place 2 sprays into the nose daily as needed for allergies.     lisinopril 5 MG tablet  Commonly known as:  PRINIVIL,ZESTRIL  Take 5 mg by mouth 2 (two) times daily.     metoprolol tartrate 25 MG tablet  Commonly  known as:  LOPRESSOR  Take 25 mg by mouth 2 (two) times daily. At noon and at bedtime (after 11pm)     montelukast 10 MG tablet  Commonly known as:  SINGULAIR  Take 5 mg by mouth 2 (two) times daily.     multivitamin with minerals Tabs  Take 1 tablet by mouth daily.  omega-3 acid ethyl esters 1 G capsule  Commonly known as:  LOVAZA  Take 1 g by mouth every evening.     OVER THE COUNTER MEDICATION  Apply 1 application topically daily as needed (for acne). Oxy 10 vanishing cream     oxyCODONE 5 MG immediate release tablet  Commonly known as:  Oxy IR/ROXICODONE  Take 1 tablet (5 mg total) by mouth every 4 (four) hours as needed.     polyethylene glycol packet  Commonly known as:  MIRALAX / GLYCOLAX  Take 17 g by mouth daily.     predniSONE 10 MG tablet  Commonly known as:  DELTASONE  Take 2.5 mg by mouth 2 (two) times daily as needed (for shortness of breath and vertigo from allergies).     rosuvastatin 10 MG tablet  Commonly known as:  CRESTOR  Take 10 mg by mouth daily after supper.     thiamine 100 MG tablet  Commonly known as:  VITAMIN B-1  Take 100 mg by mouth 2 (two) times daily at 10 AM and 5 PM.     vitamin B-12 500 MCG tablet  Commonly known as:  CYANOCOBALAMIN  Take 500-1,000 mcg by mouth 3 (three) times a week. Random days       Verbal and written Discharge instructions given to the patient. Wound care per Discharge AVS     Follow-up Information   Follow up with Josephina Gip, MD In 3 weeks. (sent)    Contact information:   [address] [place] [address] [phone]       Signed: [facility] 11/07/2012, 11:18 AM  - For VQI Registry use --- Instructions: Press F2 to tab through selections.  Delete question if not applicable.   Post-op:  Time to Extubation: [x ] In OR, [ ]  < 12 hrs, [ ]  12-24 hrs, [ ]  >=24 hrs Vasopressors Req. Post-op: No ICU Stay: 2 days Transfusion: No  If yes, 0 units given MI: [x ] No, [ ]  Troponin only, [  ] EKG or Clinical New Arrhythmia: No  Complications: CHF: No Resp failure: [x ] none, [ ]  Pneumonia, [ ]  Ventilator Chg in renal function: [x ] none, [ ]  Inc. Cr > 0.5, [ ]  Temp. Dialysis, [ ]  Permanent dialysis Leg ischemia: [x ] No, [ ]  Yes, no Surgery needed, [ ]  Yes, Surgery needed, [ ]  Amputation Bowel ischemia: [x ] No, [ ]  Medical Rx, [ ]  Surgical Rx Wound complication: [x ] No, [ ]  Superficial separation/infection, [ ]  Return to OR Return to OR: No  Return to OR for bleeding: No Stroke: [x ] None, [ ]  Minor, [ ]  Major  Discharge medications: Statin use:  Yes ASA use:  Yes Plavix use:  No  for medical reason   Beta blocker use: Yes

## 2012-11-07 NOTE — Progress Notes (Addendum)
[facility]   Post-op  Intra-abdominal Surgery note  Date of Surgery: 10/27/2012 - 11/01/2012  Surgeon(s): Pryor Ochoa, MD Larina Earthly, MD  6 Days Post-Op Procedure(s): ANEURYSM ABDOMINAL AORTIC REPAIR  History of Present Illness  [name] is a 62 y.o. male who is  up s/p Procedure(s): ANEURYSM ABDOMINAL AORTIC REPAIR Pt is doing well. denies increased incisional pain; denies nausea/vomiting; denies diarrhea. has had flatus;has not had BM  IMAGING: No results found.  Significant Diagnostic Studies: CBC Lab Results  Component Value Date   WBC 5.6 11/06/2012   HGB 8.9* 11/06/2012   HCT 26.2* 11/06/2012   MCV 90.0 11/06/2012   PLT 127* 11/06/2012    BMET    Component Value Date/Time   NA 138 11/06/2012 0410   K 3.7 11/06/2012 0410   CL 103 11/06/2012 0410   CO2 26 11/06/2012 0410   GLUCOSE 117* 11/06/2012 0410   BUN 12 11/06/2012 0410   CREATININE 0.94 11/06/2012 0410   CREATININE 0.84 06/09/2012 1515   CALCIUM 8.3* 11/06/2012 0410   GFRNONAA 90* 11/06/2012 0410   GFRAA >90 11/06/2012 0410    COAG Lab Results  Component Value Date   INR 1.19 11/01/2012   INR 1.10 10/27/2012   INR 2.50 04/24/2012   No results found for this basename: PTT    I/O last 3 completed shifts: In: 840 [P.O.:840] Out: -     Physical Examination BP Readings from Last 3 Encounters:  11/07/12 139/85  11/07/12 139/85  10/27/12 120/64   Temp Readings from Last 3 Encounters:  11/07/12 98.4 F (36.9 C) Oral  11/07/12 98.4 F (36.9 C) Oral  06/27/12 98.2 F (36.8 C) Oral   SpO2 Readings from Last 3 Encounters:  11/07/12 96%  11/07/12 96%  10/27/12 95%   Pulse Readings from Last 3 Encounters:  11/07/12 83  11/07/12 83  10/27/12 86    General: A&O x 3, WDWN male in NAD Pulmonary: normal non-labored breathing , without Rales, rhonchi,  wheezing Cardiac: Heart rate : regular ,  Abdomen:abdomen soft Abdominal wound:clean, dry, intact  Neurologic:  A&O X 3; Appropriate Affect ;  Speech is fluent/normal  Vascular Exam:BLE warm and well perfused Extremities without ischemic changes, no Gangrene, no cellulitis; no open wounds;   LOWER EXTREMITY PULSES           RIGHT                                      LEFT      POSTERIOR TIBIAL  palpable  palpable       DORSALIS PEDIS      ANTERIOR TIBIAL  palpable  palpable      Assessment/Plan: [name] is a 62 y.o. male who is 6 Days Post-Op Procedure(s): ANEURYSM ABDOMINAL AORTIC REPAIR Hemoglobin 8.9 stable Fleets per patient request today Restarted lisinopril daily and lopressor QD Ordered HH PT  PT for ambulation home verses CIR.    [facility]  11/07/2012 8:21 AM     Agree with above assessment Abdomen soft nontender Abdominal incision healing nicely 3+ femoral and dorsalis pedis pulses palpable with both feet well perfused  Patient concerned about not having had a bowel movement and drop in hemoglobin to 9 g No evidence of bleeding Will repeat CBC this a.m. if hemoglobin stable the patient continues to ambulate well will discharge home  Duplex suppositories this morning if no results we'll give fleets enema prior to DC home  Think the patient would do better when he is independent at home and he agrees with this

## 2012-11-07 NOTE — Progress Notes (Signed)
Discharge instructions given to pt along with new prescription. Pt verbalized his understanding. Awaiting walker for pt to go home with. Pt is stable for discharge with family.

## 2012-11-07 NOTE — Care Management Note (Signed)
    Page 1 of 2   11/07/2012     3:44:10 PM   CARE MANAGEMENT NOTE 11/07/2012  Patient:  NOTNAMED, SCHOLZ   Account Number:  [id number]  Date Initiated:  11/07/2012  Documentation initiated by:  Jayton Popelka  Subjective/Objective Assessment:   PT S/P AAA RESECTION ON 11/01/12.  PTA, PT RESIDES AT HOME, AND IS INDEPENDENT.     Action/Plan:   PT NEEDS HH FOLLOW UP.  REQUESTS [facility], RW FOR HOME.  START OF CARE 24-48H POST DC DATE.  FAMILY TO PROVIDE ASSISTANCE AT HOME.   Anticipated DC Date:  11/07/2012   Anticipated DC Plan:  HOME W HOME HEALTH SERVICES      DC Planning Services  CM consult      [facility] Choice  HOME HEALTH   Choice offered to / List presented to:  C-1 Patient   DME arranged  Levan Hurst      DME agency  [facility]     [facility] arranged  HH-1 RN  HH-2 PT      [facility] agency  [facility]   Status of service:  Completed, signed off Medicare Important Message given?   (If response is "NO", the following Medicare IM given date fields will be blank) Date Medicare IM given:   Date Additional Medicare IM given:    Discharge Disposition:  HOME W HOME HEALTH SERVICES  Per UR Regulation:  Reviewed for med. necessity/level of care/duration of stay  If discussed at Long Length of Stay Meetings, dates discussed:    Comments:

## 2012-11-08 ENCOUNTER — Telehealth: Payer: Self-pay

## 2012-11-08 ENCOUNTER — Telehealth: Payer: Self-pay | Admitting: Internal Medicine

## 2012-11-08 DIAGNOSIS — G8918 Other acute postprocedural pain: Secondary | ICD-10-CM

## 2012-11-08 MED ORDER — POLYETHYLENE GLYCOL 3350 17 G PO PACK: 17.0000 g | PACK | Freq: Every day | ORAL | Status: DC

## 2012-11-08 MED ORDER — HYDROCODONE-ACETAMINOPHEN 5-325 MG PO TABS: ORAL_TABLET | ORAL | Status: DC

## 2012-11-08 MED ORDER — TAMSULOSIN HCL 0.4 MG PO CAPS: 0.4000 mg | ORAL_CAPSULE | Freq: Every day | ORAL | Status: DC

## 2012-11-08 NOTE — Telephone Encounter (Signed)
Done. thanks

## 2012-11-08 NOTE — Telephone Encounter (Signed)
Pharmacist from [facility] called to state pt. rec'd Oxycodone 5 mg tabs for pain, upon discharge.  Has tried to take the Oxycodone, and it makes him very groggy.  States the patient thought he was to receive Vicodin for pain @ discharge.  Is requesting to be changed to the Vicodin, and willing to return the Oxycodone to the pharmacy.  Referred the pharmacist to [facility], [place] for our practice.  States that Narda Rutherford requested that the office handle changing the Rx to Vicodin.  Shelva Majestic, RPh. A verbal order (per standing order) for Hydrocodone/Acetaminophen 5/325 mg, take 1-2 tabs q 6 hrs/ prn for pain, # 20 / no refills.  Advised pharmacist that patient does not need to come to the office to pick up an Rx.

## 2012-11-08 NOTE — Telephone Encounter (Signed)
Pt just had AAA surgery.  Discharged yesterday.  Requesting an RX Flomax and Miralax.  Having problems urinating and pain meds are causing constipation.  He uses [facility] outpatient pharmacy.

## 2012-11-09 ENCOUNTER — Telehealth: Payer: Self-pay | Admitting: *Deleted

## 2012-11-09 NOTE — Telephone Encounter (Signed)
Wife is aware

## 2012-11-09 NOTE — Telephone Encounter (Signed)
I received a call from [facility] asking to remove patients staples from his AAA repair on 11/01/12. I explained that these would need to remain in for at least 2 weeks. Patients return appt for staple removal and to follow up with Dr Hart Rochester is scheduled 11/28/12.  I called patient to explain why  the staples could not be removed at this time and he stated he wanted to ask me a few questions, that he had not received instructions upon discharge.  He asked about a walking program, a neighbor was going to bring him a treadmill and if he was to be lifting anything. I explained to him that he should not be on an exercise program, only to walk around in his home until he sees Dr Hart Rochester. I instructed him that he should not be lifting anything weighing more than a gallon of milk. He had some concern with constipation so I instructed him to increase his water, fiber in his diet and as he was up ambulating more he should notice improvement.  He was most appreciative and agreed to call if he had any other concerns.

## 2012-11-13 ENCOUNTER — Telehealth: Payer: Self-pay | Admitting: Neurology

## 2012-11-13 NOTE — Telephone Encounter (Signed)
I called and spoke with the patient concerning scheduling a NCV/EMG per Dr. Oliva Bustard notes in [facility] to schedule a rv-ncv/emg. Patient wants to know if he should have the ncv/emg prior to the r/s. I will call patient back once I speak with Dr. Vickey Huger.

## 2012-11-14 ENCOUNTER — Telehealth: Payer: Self-pay | Admitting: *Deleted

## 2012-11-14 DIAGNOSIS — G540 Brachial plexus disorders: Secondary | ICD-10-CM

## 2012-11-14 NOTE — Telephone Encounter (Signed)
Patient called wanting to know when he could have the NCV/EMG done, per physician's last office note in [facility].  Please place order in Epic.

## 2012-11-14 NOTE — Addendum Note (Signed)
Addended by: [name] on: 11/14/2012 06:04 PM   Modules accepted: Orders

## 2012-11-14 NOTE — Telephone Encounter (Signed)
Created a new phone note.

## 2012-11-15 ENCOUNTER — Telehealth: Payer: Self-pay | Admitting: Neurology

## 2012-11-16 ENCOUNTER — Encounter: Payer: Self-pay | Admitting: *Deleted

## 2012-11-16 ENCOUNTER — Telehealth: Payer: Self-pay | Admitting: *Deleted

## 2012-11-16 ENCOUNTER — Emergency Department (HOSPITAL_COMMUNITY): Payer: 59

## 2012-11-16 ENCOUNTER — Emergency Department (HOSPITAL_COMMUNITY)
Admission: EM | Admit: 2012-11-16 | Discharge: 2012-11-16 | Disposition: A | Discharge status: Home / Self Care | Payer: 59 | Attending: Emergency Medicine | Admitting: Emergency Medicine

## 2012-11-16 ENCOUNTER — Encounter (HOSPITAL_COMMUNITY): Payer: Self-pay | Admitting: Family Medicine

## 2012-11-16 DIAGNOSIS — R0602 Shortness of breath: Secondary | ICD-10-CM

## 2012-11-16 DIAGNOSIS — J309 Allergic rhinitis, unspecified: Secondary | ICD-10-CM | POA: Insufficient documentation

## 2012-11-16 DIAGNOSIS — E559 Vitamin D deficiency, unspecified: Secondary | ICD-10-CM | POA: Insufficient documentation

## 2012-11-16 DIAGNOSIS — IMO0002 Reserved for concepts with insufficient information to code with codable children: Secondary | ICD-10-CM | POA: Insufficient documentation

## 2012-11-16 DIAGNOSIS — F411 Generalized anxiety disorder: Secondary | ICD-10-CM | POA: Insufficient documentation

## 2012-11-16 DIAGNOSIS — J449 Chronic obstructive pulmonary disease, unspecified: Secondary | ICD-10-CM | POA: Insufficient documentation

## 2012-11-16 DIAGNOSIS — G43909 Migraine, unspecified, not intractable, without status migrainosus: Secondary | ICD-10-CM | POA: Insufficient documentation

## 2012-11-16 DIAGNOSIS — Z86718 Personal history of other venous thrombosis and embolism: Secondary | ICD-10-CM | POA: Insufficient documentation

## 2012-11-16 DIAGNOSIS — Z862 Personal history of diseases of the blood and blood-forming organs and certain disorders involving the immune mechanism: Secondary | ICD-10-CM | POA: Insufficient documentation

## 2012-11-16 DIAGNOSIS — Z7982 Long term (current) use of aspirin: Secondary | ICD-10-CM | POA: Insufficient documentation

## 2012-11-16 DIAGNOSIS — R7309 Other abnormal glucose: Secondary | ICD-10-CM | POA: Insufficient documentation

## 2012-11-16 DIAGNOSIS — J45909 Unspecified asthma, uncomplicated: Secondary | ICD-10-CM | POA: Insufficient documentation

## 2012-11-16 DIAGNOSIS — K219 Gastro-esophageal reflux disease without esophagitis: Secondary | ICD-10-CM | POA: Insufficient documentation

## 2012-11-16 DIAGNOSIS — M129 Arthropathy, unspecified: Secondary | ICD-10-CM | POA: Insufficient documentation

## 2012-11-16 DIAGNOSIS — R109 Unspecified abdominal pain: Secondary | ICD-10-CM | POA: Insufficient documentation

## 2012-11-16 DIAGNOSIS — Z79899 Other long term (current) drug therapy: Secondary | ICD-10-CM | POA: Insufficient documentation

## 2012-11-16 DIAGNOSIS — E785 Hyperlipidemia, unspecified: Secondary | ICD-10-CM | POA: Insufficient documentation

## 2012-11-16 DIAGNOSIS — F419 Anxiety disorder, unspecified: Secondary | ICD-10-CM

## 2012-11-16 DIAGNOSIS — Z7901 Long term (current) use of anticoagulants: Secondary | ICD-10-CM | POA: Insufficient documentation

## 2012-11-16 DIAGNOSIS — G4733 Obstructive sleep apnea (adult) (pediatric): Secondary | ICD-10-CM | POA: Insufficient documentation

## 2012-11-16 DIAGNOSIS — Z8679 Personal history of other diseases of the circulatory system: Secondary | ICD-10-CM | POA: Insufficient documentation

## 2012-11-16 DIAGNOSIS — N529 Male erectile dysfunction, unspecified: Secondary | ICD-10-CM | POA: Insufficient documentation

## 2012-11-16 DIAGNOSIS — Z87891 Personal history of nicotine dependence: Secondary | ICD-10-CM | POA: Insufficient documentation

## 2012-11-16 DIAGNOSIS — J4489 Other specified chronic obstructive pulmonary disease: Secondary | ICD-10-CM | POA: Insufficient documentation

## 2012-11-16 DIAGNOSIS — I10 Essential (primary) hypertension: Secondary | ICD-10-CM | POA: Insufficient documentation

## 2012-11-16 LAB — CBC
HCT: 35.8 % — ABNORMAL LOW (ref 39.0–52.0)
MCH: 30.7 pg (ref 26.0–34.0)
MCHC: 33.8 g/dL (ref 30.0–36.0)
RBC: 3.94 MIL/uL — ABNORMAL LOW (ref 4.22–5.81)
WBC: 10.9 10*3/uL — ABNORMAL HIGH (ref 4.0–10.5)

## 2012-11-16 LAB — BASIC METABOLIC PANEL
BUN: 23 mg/dL (ref 6–23)
Calcium: 9.3 mg/dL (ref 8.4–10.5)
Creatinine, Ser: 0.77 mg/dL (ref 0.50–1.35)
GFR calc non Af Amer: >90 mL/min (ref >90)
Glucose, Bld: 155 mg/dL — ABNORMAL HIGH (ref 70–99)
Sodium: 134 mEq/L — ABNORMAL LOW (ref 135–145)

## 2012-11-16 MED ORDER — IOHEXOL 350 MG/ML SOLN
100.0000 mL | Freq: Once | INTRAVENOUS | Status: AC | PRN
  Administered 2012-11-16: 100 mL via INTRAVENOUS

## 2012-11-16 MED ORDER — ALPRAZOLAM 0.25 MG PO TABS
0.2500 mg | ORAL_TABLET | Freq: Once | ORAL | Status: AC
  Administered 2012-11-16: 0.25 mg via ORAL
  Filled 2012-11-16: qty 1

## 2012-11-16 NOTE — ED Notes (Addendum)
Pt c/o sob since he got home from surgery. Pt reports he was just having episode of sob before but today it has been chronic and he has become anxious from it. Pt reports the sob has been worse today too. Pt c/o CP that started mid afternoon today, but has gone away already. Pt had sx for AAA. Pt reports he is supposed to have a post-op appt 2 weeks out but then they changed it to 3 weeks out. Pt had the sx done on 11/01/12. Pt reports he has been fatigued since he left the hospital but worsened today. Pt reports he is really anxious and has a sense that something bad is about to happen. Pt is in nad, skin is diaphoretic, resp e/u.

## 2012-11-16 NOTE — ED Notes (Signed)
Pt returned from radiology.

## 2012-11-16 NOTE — ED Notes (Signed)
Portable radiology at bedside.

## 2012-11-16 NOTE — Telephone Encounter (Signed)
Patient called c/o shortness of breath and not having relief upon resting.  He had had episodes earlier today but after resting the symptoms improved, now however the symptoms have worsened. He is experiencing weakness, tingling in both calves and very anxious. He is s/p AAA repair 11/01/12.  I spoke with Dr Fabienne Bruns and directed patient to emergency department.

## 2012-11-16 NOTE — ED Provider Notes (Signed)
History     CSN: [id number]  Arrival date & time 11/16/12  1602   First MD Initiated Contact with Patient 11/16/12 1648      Chief Complaint  Patient presents with  . Shortness of Breath    (Consider location/radiation/quality/duration/timing/severity/associated sxs/prior treatment) HPI [name] is a 62 y.o. male with a history of COPD, shortness of breath (related to anxiety) dissecting aortic aneurysm with post surgical DVT (September 2013) and recent AAA repair her (Dr. Hart Rochester 11/01/2012) presents emergency department complaining of shortness of breath.  Onset of symptoms have been present since surgery however they've been gradually worsening and became concerning today.  Patient states that he feels as though he is unable to get enough oxygen even when taking a deep breath.  In addition patient reports that he is very anxious and has not taken his Xanax.  Patient denies any leg swelling, cough, hemoptysis, calf pain, headaches, change in vision, fever, night sweats or chills.  Past Medical History  Diagnosis Date  . ERECTILE DYSFUNCTION, ORGANIC   . Unspecified vitamin D deficiency   . HYPERLIPIDEMIA   . ASTHMA   . ALLERGIC RHINITIS   . Dissecting aortic aneurysm, thoracic 02/20/2012    s/p emergent surgical repair  . Hyperglycemia   . Anxiety   . Complication of anesthesia     neuropathy- knees to feet post surgery, due to positional in last surgery, brachial plexus   . OBSTRUCTIVE SLEEP APNEA 02/2008 sleep study    moderate & central- not able to tolerate CPAP   . Shortness of breath     related to anxiety   . GERD (gastroesophageal reflux disease)     related to diet & anxiety   . COPD (chronic obstructive pulmonary disease)     also states he has been told that he has slight emphysema   . Arthritis     OA, hands, - thumbs- specifically  . DVT (deep venous thrombosis)     coumadin- x3 months , post op  . Anemia     post op, treated /w Fe  . Hypertension      pt. followwed by Dr. Eden Emms  . MIGRAINE HEADACHE     induced from physical , emotional stress , but sometimes occur randomly, also experiences BPPV    Past Surgical History  Procedure Laterality Date  . Nasal fx without repair    . Thoracic aortic aneurysm repair  02/20/2012    Procedure: THORACIC ASCENDING ANEURYSM REPAIR (AAA);  Surgeon: Kerin Perna, MD;  Location[facility];  Service: Open Heart Surgery;  Laterality: N/A;  . Abdominal aortic aneurysm repair N/A 11/01/2012    Procedure: ANEURYSM ABDOMINAL AORTIC REPAIR;  Surgeon: Pryor Ochoa, MD;  Location: [facility] OR;  Service: Vascular;  Laterality: N/A;  Resection and Grafting of Abdominal Aortic Aneurysm,(AORTA BI ILIAC)    Family History  Problem Relation Age of Onset  . Diabetes Mother   . Heart disease Maternal Uncle   . Heart disease Paternal Uncle   . Heart disease Cousin   . Lung cancer Cousin     2 cousins    History  Substance Use Topics  . Smoking status: Former Smoker -- 0.10 packs/day    Quit date: 02/18/2012  . Smokeless tobacco: Never Used     Comment: 10 cigs/day, quit 02/2012 after TAA emergent repair  . Alcohol Use: Yes     Comment: shot of Tequila every other day       Review  of Systems Ten systems reviewed and are negative for acute change, except as noted in the HPI.    Allergies  Albuterol; Testosterone; Codeine; and Ultram  Home Medications   Current Outpatient Rx  Name  Route  Sig  Dispense  Refill  . ALPRAZolam (XANAX) 1 MG tablet   Oral   Take 0.25 mg by mouth 4 (four) times daily as needed for sleep or anxiety.         [name] aspirin EC 81 MG tablet   Oral   Take 81 mg by mouth daily with lunch.         . calcium carbonate (TUMS EX) 750 MG chewable tablet   Oral   Chew 1 tablet by mouth daily.         . cholecalciferol (VITAMIN D) 1000 UNITS tablet   Oral   Take 2,000 Units by mouth daily.         [name] EPINEPHrine (EPI-PEN) 0.3 mg/0.3 mL DEVI   Intramuscular   Inject 0.3 mg  into the muscle as needed (for anaphylaxis).          . fexofenadine (ALLEGRA) 180 MG tablet   Oral   Take 90 mg by mouth 2 (two) times daily.          . fluticasone (FLONASE) 50 MCG/ACT nasal spray   Nasal   Place 2 sprays into the nose daily as needed for allergies.         [name] HYDROcodone-acetaminophen (NORCO) 5-325 MG per tablet      Take 1-2 tabs q 6 hrs/ prn/ pain.   20 tablet   0   . lisinopril (PRINIVIL,ZESTRIL) 5 MG tablet   Oral   Take 5 mg by mouth 2 (two) times daily.         . metoprolol tartrate (LOPRESSOR) 25 MG tablet   Oral   Take 25 mg by mouth 2 (two) times daily. At noon and at bedtime (after 11pm)         . montelukast (SINGULAIR) 10 MG tablet   Oral   Take 5 mg by mouth 2 (two) times daily.         . Multiple Vitamin (MULTIVITAMIN WITH MINERALS) TABS   Oral   Take 1 tablet by mouth daily.         [name] omega-3 acid ethyl esters (LOVAZA) 1 G capsule   Oral   Take 1 g by mouth every evening.          [name] OVER THE COUNTER MEDICATION   Topical   Apply 1 application topically daily as needed (for acne). Oxy 10 vanishing cream         . oxyCODONE (OXY IR/ROXICODONE) 5 MG immediate release tablet   Oral   Take 1 tablet (5 mg total) by mouth every 4 (four) hours as needed.   30 tablet   0   . polyethylene glycol (MIRALAX / GLYCOLAX) packet   Oral   Take 17 g by mouth daily.   30 each   1   . predniSONE (DELTASONE) 10 MG tablet   Oral   Take 2.5 mg by mouth 2 (two) times daily as needed (for shortness of breath and vertigo from allergies).         . rosuvastatin (CRESTOR) 10 MG tablet   Oral   Take 10 mg by mouth daily after supper.          . tamsulosin (FLOMAX) 0.4 MG CAPS   Oral  Take 1 capsule (0.4 mg total) by mouth daily.   30 capsule   3   . thiamine (VITAMIN B-1) 100 MG tablet   Oral   Take 100 mg by mouth 2 (two) times daily at 10 AM and 5 PM.          . vitamin B-12 (CYANOCOBALAMIN) 500 MCG tablet   Oral    Take 500-1,000 mcg by mouth 3 (three) times a week. Random days           BP 115/52  Pulse 63  Temp(Src) 97.9 F (36.6 C)  Resp 16  SpO2 100%  Physical Exam  Nursing note and vitals reviewed. Constitutional: He is oriented to person, place, and time. He appears well-developed and well-nourished. He appears distressed.  Anxious appearing  HENT:  Head: Normocephalic and atraumatic.  Eyes: Conjunctivae and EOM are normal.  Neck: Normal range of motion.  Cardiovascular:  Regular rate rhythm, intact distal pulses, no pitting edema.  Pulmonary/Chest: Effort normal.  100% on room air, in no acute respiratory distress.  Lungs clear to auscultation bilaterally.  Abdominal:  Surgical scar well healing without purulent drainage warmth or erythema.  Musculoskeletal: Normal range of motion.  No calf tenderness  Neurological: He is alert and oriented to person, place, and time.  Skin: Skin is warm and dry. No rash noted. He is not diaphoretic.    ED Course  Procedures (including critical care time)  Labs Reviewed  CBC - Abnormal; Notable for the following:    WBC 10.9 (*)    RBC 3.94 (*)    Hemoglobin 12.1 (*)    HCT 35.8 (*)    All other components within normal limits  BASIC METABOLIC PANEL - Abnormal; Notable for the following:    Sodium 134 (*)    Glucose, Bld 155 (*)    All other components within normal limits  PRO B NATRIURETIC PEPTIDE  POCT I-STAT TROPONIN I   Ct Angio Chest Pe W/cm &/or Wo Cm  11/16/2012   *RADIOLOGY REPORT*  Clinical Data: Shortness of breath, recent abdominal aortic aneurysm repair  CT ANGIOGRAPHY CHEST  Technique:  Multidetector CT imaging of the chest using the standard protocol during bolus administration of intravenous contrast. Multiplanar reconstructed images including MIPs were obtained and reviewed to evaluate the vascular anatomy.  Contrast: OMNIPAQUE IOHEXOL 350 MG/ML SOLN  Comparison: Chest radiograph 11/16/2012, CT 10/10/2012   Findings: Hypodense right thyroid lobe lesion is stable.  The patient is status post graft repair of previously seen type A aortic dissection.  No significant change in appearance of type B aortic dissection with passage of contrast into the false lumen at the level of the descending thoracic aorta and continuing to the visualized portions of the upper abdominal aorta.  No periaortic fluid collection.  Great vessels are normal in caliber and perfused.  Please note this examination was performed for optimal visualization of the pulmonary arteries, which affects the bolus timing and renders visualization of the aorta are relatively more suboptimal with respect to with luminal content.  The study is of adequate technical quality for evaluation for pulmonary embolism up to and including the 3rd order pulmonary arteries. No focal filling defect is seen to suggest acute pulmonary embolism.  No lymphadenopathy.  Heart size is normal. Evidence of median sternotomy again noted.  No pericardial or pleural effusion.  Emphysematous change with biapical blebs re-identified.  Minimal bibasilar dependent atelectasis.  No pneumothorax.  No new focal consolidation, mass, or nodule.  No acute osseous abnormality.  IMPRESSION: Postoperative change as above without significant change in type B aortic dissection allowing for technique optimization for visualization of pulmonary arterial opacification.  No CT evidence for acute pulmonary embolism.   Original Report Authenticated By: Christiana Pellant, M.D.   Dg Chest Port 1 View  11/16/2012   *RADIOLOGY REPORT*  Clinical Data: Shortness of breath.  Left upper chest pain.  Prior aortic aneurysm repair.  PORTABLE CHEST - 1 VIEW  Comparison: 11/02/2012  Findings: Removal of nasogastric and right IJ Swan-Ganz catheters.  Prior median sternotomy.  Surgical clips which project over the right axilla.  Mild cardiomegaly with tortuous thoracic aorta. Costophrenic angles minimally excluded. No  pleural effusion or pneumothorax.  Persistent mild pulmonary venous congestion. Medial right base air space disease.  IMPRESSION: Cardiomegaly with persistent mild pulmonary venous congestion.  Developing right base airspace disease, likely atelectasis.  Removal of support apparatus.   Original Report Authenticated By: Jeronimo Greaves, M.D.    Date: 11/16/2012  Rate: 78  Rhythm: normal sinus rhythm  QRS Axis: normal  Intervals: normal  ST/T Wave abnormalities: normal  Conduction Disutrbances: none  Narrative Interpretation:   Old EKG Reviewed: No significant changes noted     1. SOB (shortness of breath)   2. Anxiety       MDM  62 year old male to emergency department 2 weeks status post AAA repair complaining of shortness of breath.  CT angios order to rule out pulmonary embolism.  Postoperative changes seen, however no PE.  Patient has maintained 100% pulse ox on room air throughout hospital stay.  Breathing improved with Xanax treatment.  Troponin negative, normal ECG, x-ray without acute infiltrate and not elevated BNP.  Very low concern for cardiac or pulmonary etiology of symptoms.  Patient advised to followup with primary care physician as well as vascular surgeon.  Results discussed with attending, patient and family member who agrees with disposition plan.  At this time there does not appear to be any evidence of an acute emergency medical condition and the patient appears stable for discharge with appropriate outpatient follow up.Diagnosis was discussed with patient who verbalizes understanding and is agreeable to discharge.          Jaci Carrel, [place] 11/16/12 2032

## 2012-11-16 NOTE — ED Provider Notes (Signed)
Medical screening examination/treatment/procedure(s) were conducted as a shared visit with non-physician practitioner(s) and myself.  I personally evaluated the patient during the encounter  Patient seen by me and known to ID department. Came for SOB but workup  Negative for PE or pneumonia. Patient in NAD, okay for discharge home and followup.. Will return for new or worse symptoms. S/P ascending aortic repair in September and end of May abd aortic repair.   Results for orders placed during the hospital encounter of 11/16/12  CBC      Result Value Range   WBC 10.9 (*) 4.0 - 10.5 K/uL   RBC 3.94 (*) 4.22 - 5.81 MIL/uL   Hemoglobin 12.1 (*) 13.0 - 17.0 g/dL   HCT 41.3 (*) 24.4 - 01.0 %   MCV 90.9  78.0 - 100.0 fL   MCH 30.7  26.0 - 34.0 pg   MCHC 33.8  30.0 - 36.0 g/dL   RDW 27.2  53.6 - 64.4 %   Platelets 386  150 - 400 K/uL  BASIC METABOLIC PANEL      Result Value Range   Sodium 134 (*) 135 - 145 mEq/L   Potassium 4.4  3.5 - 5.1 mEq/L   Chloride 99  96 - 112 mEq/L   CO2 23  19 - 32 mEq/L   Glucose, Bld 155 (*) 70 - 99 mg/dL   BUN 23  6 - 23 mg/dL   Creatinine, Ser 0.34  0.50 - 1.35 mg/dL   Calcium 9.3  8.4 - 74.2 mg/dL   GFR calc non Af Amer >90  >90 mL/min   GFR calc Af Amer >90  >90 mL/min  PRO B NATRIURETIC PEPTIDE      Result Value Range   Pro B Natriuretic peptide (BNP) 38.3  0 - 125 pg/mL  POCT I-STAT TROPONIN I      Result Value Range   Troponin i, poc 0.01  0.00 - 0.08 ng/mL   Comment 3            Ct Angio Chest Pe W/cm &/or Wo Cm  11/16/2012   *RADIOLOGY REPORT*  Clinical Data: Shortness of breath, recent abdominal aortic aneurysm repair  CT ANGIOGRAPHY CHEST  Technique:  Multidetector CT imaging of the chest using the standard protocol during bolus administration of intravenous contrast. Multiplanar reconstructed images including MIPs were obtained and reviewed to evaluate the vascular anatomy.  Contrast: OMNIPAQUE IOHEXOL 350 MG/ML SOLN  Comparison: Chest  radiograph 11/16/2012, CT 10/10/2012  Findings: Hypodense right thyroid lobe lesion is stable.  The patient is status post graft repair of previously seen type A aortic dissection.  No significant change in appearance of type B aortic dissection with passage of contrast into the false lumen at the level of the descending thoracic aorta and continuing to the visualized portions of the upper abdominal aorta.  No periaortic fluid collection.  Great vessels are normal in caliber and perfused.  Please note this examination was performed for optimal visualization of the pulmonary arteries, which affects the bolus timing and renders visualization of the aorta are relatively more suboptimal with respect to with luminal content.  The study is of adequate technical quality for evaluation for pulmonary embolism up to and including the 3rd order pulmonary arteries. No focal filling defect is seen to suggest acute pulmonary embolism.  No lymphadenopathy.  Heart size is normal. Evidence of median sternotomy again noted.  No pericardial or pleural effusion.  Emphysematous change with biapical blebs re-identified.  Minimal bibasilar  dependent atelectasis.  No pneumothorax.  No new focal consolidation, mass, or nodule.  No acute osseous abnormality.  IMPRESSION: Postoperative change as above without significant change in type B aortic dissection allowing for technique optimization for visualization of pulmonary arterial opacification.  No CT evidence for acute pulmonary embolism.   Original Report Authenticated By: Christiana Pellant, M.D.   Nm Myocar Multi W/spect W/wall Motion / Ef  10/19/2012   [name] is a 62 year old gentleman with a recent history of chest pain.  He was referred to the stress lab by Dr. Kathlee Nations Trigt for [facility].  He received a resting dose of Myoview and the resting images were obtained following brief delay.  He then received a dose of Lexiscan.  He received a second dose of Myoview and  quantitated gated SPECT images were obtained following brief delay.  There are no EKG changes to suggest ischemia.  The raw data images reveal no significant motion artifact.  The stress Myoview images reveal smooth and homogeneous uptake of all areas of the myocardium.  The resting Myoview images reveal a similar pattern of smooth and homogeneous uptake of all areas of the myocardium.  There is no evidence of ischemia.  Quantitated gated SPECT images reveal an end diastolic volume of 96 ml.  The end-systolic volume is 32 ml.  The ejection fraction is calculated at 67%.  There are no segmental wall motion abnormalities.  This is interpreted as a normal  Lexiscan Myoview study.  There is no evidence of ischemia.  There is normal left ventricular systolic function.  Kristeen Miss, MD, [facility]  Oct 19, 2012,  4:00 PM   Original Report Authenticated By: Kristeen Miss, M.D.   [facility] 1 View  11/16/2012   *RADIOLOGY REPORT*  Clinical Data: Shortness of breath.  Left upper chest pain.  Prior aortic aneurysm repair.  PORTABLE CHEST - 1 VIEW  Comparison: 11/02/2012  Findings: Removal of nasogastric and right IJ Swan-Ganz catheters.  Prior median sternotomy.  Surgical clips which project over the right axilla.  Mild cardiomegaly with tortuous thoracic aorta. Costophrenic angles minimally excluded. No pleural effusion or pneumothorax.  Persistent mild pulmonary venous congestion. Medial right base air space disease.  IMPRESSION: Cardiomegaly with persistent mild pulmonary venous congestion.  Developing right base airspace disease, likely atelectasis.  Removal of support apparatus.   Original Report Authenticated By: Jeronimo Greaves, M.D.   Dg Chest Portable 1 View  11/02/2012   *RADIOLOGY REPORT*  Clinical Data: Postoperative examination  PORTABLE CHEST - 1 VIEW  Comparison: 11/01/2012; 04/12/2013  Findings: Grossly unchanged enlarged cardiac silhouette and mediastinal contours post median sternotomy.  Stable positioning of  support apparatus.  No pneumothorax.  Pulmonary vasculature remains indistinct with cephalization of flow.  Left perihilar heterogeneous opacities are grossly unchanged.  No new focal airspace opacities.  No definite pleural effusion.  Unchanged bones.  IMPRESSION: 1.  Stable positioning of support apparatus.  No pneumothorax. 2.  Persistent findings of mild pulmonary venous congestion and left perihilar atelectasis.   Original Report Authenticated By: Tacey Ruiz, MD   Dg Chest Portable 1 View  11/01/2012   *RADIOLOGY REPORT*  Clinical Data: Evaluate line placement  PORTABLE CHEST - 1 VIEW  Comparison: 04/12/2012  Findings: Right IJ PA catheter is noted with tip in the expected location of the main pulmonary artery.  Nasogastric tube is identified with side port below GE junction.  Heart size appears normal.  Lung volumes are low.  There is pulmonary  vascular congestion. Granuloma is identified the right upper lobe.  IMPRESSION:  1.  Cardiac enlargement and pulmonary vascular congestion. 2.  PA catheter tip is in the expected location of the main pulmonary artery.   Original Report Authenticated By: Signa Kell, M.D.   Dg Abd Portable 1v  11/01/2012   *RADIOLOGY REPORT*  Clinical Data: Postop AAA repair  PORTABLE ABDOMEN - 1 VIEW  Comparison: 10/10/2012  Findings: Normal bowel gas pattern.  No abnormal bowel dilatation identified.  There is skin staples along the midline of the abdomen.  IMPRESSION:  1.  Normal bowel gas pattern.   Original Report Authenticated By: Signa Kell, M.D.      Shelda Jakes, MD 11/16/12 2107

## 2012-11-16 NOTE — ED Notes (Signed)
Per pt recent AAA surgery an has had increased SOB. sts very anxious. sts hx of blood clots.

## 2012-11-20 ENCOUNTER — Other Ambulatory Visit: Payer: Self-pay | Admitting: *Deleted

## 2012-11-20 DIAGNOSIS — G8918 Other acute postprocedural pain: Secondary | ICD-10-CM

## 2012-11-20 MED ORDER — HYDROCODONE-ACETAMINOPHEN 5-325 MG PO TABS: ORAL_TABLET | ORAL | Status: DC

## 2012-11-20 NOTE — Telephone Encounter (Signed)
Called pharmacy had to leave authorization on doctors voice mail...[name]

## 2012-11-27 ENCOUNTER — Encounter: Payer: Self-pay | Admitting: Cardiovascular Disease

## 2012-11-27 ENCOUNTER — Encounter: Payer: Self-pay | Admitting: Vascular Surgery

## 2012-11-28 ENCOUNTER — Ambulatory Visit (INDEPENDENT_AMBULATORY_CARE_PROVIDER_SITE_OTHER): Payer: 59 | Admitting: Vascular Surgery

## 2012-11-28 ENCOUNTER — Encounter: Payer: Self-pay | Admitting: Vascular Surgery

## 2012-11-28 VITALS — BP 135/73 | HR 74 | Resp 18 | Ht 76.0 in | Wt 218.0 lb

## 2012-11-28 DIAGNOSIS — I714 Abdominal aortic aneurysm, without rupture: Secondary | ICD-10-CM

## 2012-11-28 NOTE — Progress Notes (Signed)
Subjective:     Patient ID: [name], male   DOB: Sep 14, 1950, 62 y.o.   MRN: [id number]  HPI this 62 year old male returns for initial followup regarding resection and grafting abdominal aortic aneurysm-status post type B aortic dissection extending into the left renal artery. He had an aortobifem iliac graft. He's done very well postoperatively. He has had good urine output. He has had some occasional shortness of breath probably due to anxiety and went to the emergency department on 1 occasion. He is regaining his appetite slowly but still feels weak. He has no specific complaints.  Past Medical History  Diagnosis Date  . ERECTILE DYSFUNCTION, ORGANIC   . Unspecified vitamin D deficiency   . HYPERLIPIDEMIA   . ASTHMA   . ALLERGIC RHINITIS   . Dissecting aortic aneurysm, thoracic 02/20/2012    s/p emergent surgical repair  . Hyperglycemia   . Anxiety   . Complication of anesthesia     neuropathy- knees to feet post surgery, due to positional in last surgery, brachial plexus   . OBSTRUCTIVE SLEEP APNEA 02/2008 sleep study    moderate & central- not able to tolerate CPAP   . Shortness of breath     related to anxiety   . GERD (gastroesophageal reflux disease)     related to diet & anxiety   . COPD (chronic obstructive pulmonary disease)     also states he has been told that he has slight emphysema   . Arthritis     OA, hands, - thumbs- specifically  . DVT (deep venous thrombosis)     coumadin- x3 months , post op  . Anemia     post op, treated /w Fe  . Hypertension     pt. followwed by Dr. Eden Emms  . MIGRAINE HEADACHE     induced from physical , emotional stress , but sometimes occur randomly, also experiences BPPV    History  Substance Use Topics  . Smoking status: Former Smoker -- 0.10 packs/day    Types: Cigarettes    Quit date: 02/18/2012  . Smokeless tobacco: Never Used     Comment: 10 cigs/day, quit 02/2012 after TAA emergent repair  . Alcohol Use: Yes      Comment: shot of Tequila every other day     Family History  Problem Relation Age of Onset  . Diabetes Mother   . Heart disease Maternal Uncle   . Heart disease Paternal Uncle   . Heart disease Cousin   . Lung cancer Cousin     2 cousins    Allergies  Allergen Reactions  . Albuterol Other (See Comments)    Panic attack  . Testosterone Shortness Of Breath    Medication:Androgel Pt states trouble breathing at night  . Codeine Nausea And Vomiting  . Ultram (Tramadol) Nausea And Vomiting    Current outpatient prescriptions:ALPRAZolam (XANAX) 1 MG tablet, Take 0.25 mg by mouth 4 (four) times daily as needed for sleep or anxiety., Disp: , Rfl: ;  aspirin EC 81 MG tablet, Take 81 mg by mouth daily with lunch., Disp: , Rfl: ;  calcium carbonate (TUMS EX) 750 MG chewable tablet, Chew 1 tablet by mouth daily., Disp: , Rfl: ;  cholecalciferol (VITAMIN D) 1000 UNITS tablet, Take 2,000 Units by mouth daily., Disp: , Rfl:  EPINEPHrine (EPI-PEN) 0.3 mg/0.3 mL DEVI, Inject 0.3 mg into the muscle as needed (for anaphylaxis). , Disp: , Rfl: ;  fexofenadine (ALLEGRA) 180 MG tablet, Take 90 mg by mouth  2 (two) times daily. , Disp: , Rfl: ;  fluticasone (FLONASE) 50 MCG/ACT nasal spray, Place 2 sprays into the nose daily as needed for allergies., Disp: , Rfl: ;  HYDROcodone-acetaminophen (NORCO) 5-325 MG per tablet, Take 1-2 tabs q 6 hrs/ prn/ pain., Disp: 20 tablet, Rfl: 0 lisinopril (PRINIVIL,ZESTRIL) 5 MG tablet, Take 5 mg by mouth 2 (two) times daily., Disp: , Rfl: ;  metoprolol tartrate (LOPRESSOR) 25 MG tablet, Take 25 mg by mouth 2 (two) times daily. At noon and at bedtime (after 11pm), Disp: , Rfl: ;  montelukast (SINGULAIR) 10 MG tablet, Take 5 mg by mouth 2 (two) times daily., Disp: , Rfl: ;  Multiple Vitamin (MULTIVITAMIN WITH MINERALS) TABS, Take 1 tablet by mouth daily., Disp: , Rfl:  omega-3 acid ethyl esters (LOVAZA) 1 G capsule, Take 1 g by mouth every evening. , Disp: , Rfl: ;  OVER THE  COUNTER MEDICATION, Apply 1 application topically daily as needed (for acne). Oxy 10 vanishing cream, Disp: , Rfl: ;  oxyCODONE (OXY IR/ROXICODONE) 5 MG immediate release tablet, Take 1 tablet (5 mg total) by mouth every 4 (four) hours as needed., Disp: 30 tablet, Rfl: 0 polyethylene glycol (MIRALAX / GLYCOLAX) packet, Take 17 g by mouth daily., Disp: 30 each, Rfl: 1;  predniSONE (DELTASONE) 10 MG tablet, Take 2.5 mg by mouth 2 (two) times daily as needed (for shortness of breath and vertigo from allergies)., Disp: , Rfl: ;  rosuvastatin (CRESTOR) 10 MG tablet, Take 10 mg by mouth daily after supper. , Disp: , Rfl:  tamsulosin (FLOMAX) 0.4 MG CAPS, Take 1 capsule (0.4 mg total) by mouth daily., Disp: 30 capsule, Rfl: 3;  thiamine (VITAMIN B-1) 100 MG tablet, Take 100 mg by mouth 2 (two) times daily at 10 AM and 5 PM. , Disp: , Rfl: ;  vitamin B-12 (CYANOCOBALAMIN) 500 MCG tablet, Take 500-1,000 mcg by mouth 3 (three) times a week. Random days, Disp: , Rfl:   BP 135/73  Pulse 74  Resp 18  Ht 6\' 4"  (1.93 m)  Wt 218 lb (98.884 kg)  BMI 26.55 kg/m2  Body mass index is 26.55 kg/(m^2).          Review of Systems     Objective:   Physical Exam BP 135/73  Pulse 74  Resp 18  Ht 6\' 4"  (1.93 m)  Wt 218 lb (98.884 kg)  BMI 26.55 kg/m2  General well-developed well-nourished male in no apparent distress alert and oriented x3 Abdomen soft with well-healed midline incision skin staples removed no evidence of ventral hernia 3+ femoral and posterior tibial pulses palpable bilaterally.     Assessment:     Doing well 4 weeks post resection and grafting of infrarenal abdominal aortic aneurysm with type A aortic dissection including into the left renal artery    Plan:     Return in 2 months for continued followup Will check renal function prior to his return He will followup with his medical doctor in the interim

## 2012-11-28 NOTE — Addendum Note (Signed)
Addended by: Sharee Pimple on: 11/28/2012 11:04 AM   Modules accepted: Orders

## 2012-12-05 ENCOUNTER — Encounter: Payer: Self-pay | Admitting: Internal Medicine

## 2012-12-05 ENCOUNTER — Other Ambulatory Visit (INDEPENDENT_AMBULATORY_CARE_PROVIDER_SITE_OTHER): Payer: 59

## 2012-12-05 ENCOUNTER — Ambulatory Visit (INDEPENDENT_AMBULATORY_CARE_PROVIDER_SITE_OTHER): Payer: 59 | Admitting: Internal Medicine

## 2012-12-05 VITALS — BP 120/68 | HR 84 | Temp 97.0°F

## 2012-12-05 DIAGNOSIS — R7309 Other abnormal glucose: Secondary | ICD-10-CM

## 2012-12-05 DIAGNOSIS — F411 Generalized anxiety disorder: Secondary | ICD-10-CM

## 2012-12-05 DIAGNOSIS — R200 Anesthesia of skin: Secondary | ICD-10-CM

## 2012-12-05 DIAGNOSIS — R739 Hyperglycemia, unspecified: Secondary | ICD-10-CM

## 2012-12-05 DIAGNOSIS — F419 Anxiety disorder, unspecified: Secondary | ICD-10-CM

## 2012-12-05 DIAGNOSIS — R209 Unspecified disturbances of skin sensation: Secondary | ICD-10-CM

## 2012-12-05 DIAGNOSIS — R55 Syncope and collapse: Secondary | ICD-10-CM

## 2012-12-05 DIAGNOSIS — R42 Dizziness and giddiness: Secondary | ICD-10-CM

## 2012-12-05 MED ORDER — FLUTICASONE PROPIONATE 50 MCG/ACT NA SUSP: 2.0000 | Freq: Every day | NASAL | Status: DC

## 2012-12-05 MED ORDER — AMITRIPTYLINE HCL 10 MG PO TABS: 10.0000 mg | ORAL_TABLET | Freq: Every day | ORAL | Status: DC

## 2012-12-05 NOTE — Progress Notes (Signed)
Subjective:    Patient ID: [name], male    DOB: 05-01-1951, 61 y.o.   MRN: [id number]  HPI  Here for numbness - hands, BLE (distal, symmetric) and near syncope event x 2 in past 2 weeks  Reports increasing anxiety symptoms: fatigue, poor focus, poor sleep - temporary improvement with prn low dose xanax, prefers qhs tequila shot - reluctant to consider additional meds, "but i need to sleep"  Also reviewed interval events and chronic medical issues:  PVD: AAA repair 11/01/12 with L renal art involvement - Hx TAA with emergent surgical repair of thoracic aortic aneurysm dissection 02/20/12. Postop course 02/2012 complicated by encephalopathy and development of left lower leg DVT s/p 3 months warfarin (DVT felt provoked rFVIIa administration intraoperatively)   Hyperglycemia noted during hospitalization 02/2012 - briefly on OHA, DC's due to symptoms of hypoglycemia, has not been checking cbgs at home since, strong FH DM  possible R brachial plexus injury intraoperatively 02/2012 -abnormal appearance of right forearm and numbness of right hand- pending neuro follow up for repeat NCS  sleep apnea - sleep study 9/09 with moderate sleep apnea reviewed. However, patient intolerant of CPAP -   dyslipidemia - prev on lovaza, simva and lipitor tx for same- changed from vytorin tabs to crestor 02/2012 -the patient reports compliance with medication(s) as prescribed. Denies adverse side effects.  Past Medical History  Diagnosis Date  . ERECTILE DYSFUNCTION, ORGANIC   . Unspecified vitamin D deficiency   . HYPERLIPIDEMIA   . ASTHMA   . ALLERGIC RHINITIS   . Dissecting aortic aneurysm, thoracic 02/20/2012    s/p emergent surgical repair  . Hyperglycemia   . Anxiety   . OBSTRUCTIVE SLEEP APNEA 02/2008 sleep study    moderate & central- not able to tolerate CPAP   . Shortness of breath     related to anxiety   . GERD (gastroesophageal reflux disease)     related to diet & anxiety   . COPD  (chronic obstructive pulmonary disease)     also states he has been told that he has slight emphysema   . Arthritis     OA, hands, - thumbs- specifically  . DVT (deep venous thrombosis)     coumadin- x3 months , post op  . Anemia     post op, treated /w Fe  . Hypertension     pt. followwed by Dr. Eden Emms  . MIGRAINE HEADACHE     induced from physical , emotional stress , but sometimes occur randomly, also experiences BPPV   Family History  Problem Relation Age of Onset  . Diabetes Mother   . Heart disease Maternal Uncle   . Heart disease Paternal Uncle   . Heart disease Cousin   . Lung cancer Cousin     2 cousins   History  Substance Use Topics  . Smoking status: Former Smoker -- 0.10 packs/day    Types: Cigarettes    Quit date: 02/18/2012  . Smokeless tobacco: Never Used     Comment: 10 cigs/day, quit 02/2012 after TAA emergent repair  . Alcohol Use: Yes     Comment: shot of Tequila every other day     Review of Systems Constitutional: Negative for fever or weight change.  Respiratory: Negative for cough; positive for episodic shortness of breath.   Cardiovascular: Negative for chest pain or palpitations.  Gastrointestinal: Negative for abdominal pain, no bowel changes.  Musculoskeletal: Negative for gait problem or joint swelling.  Skin: Negative for rash.  Neurological: Positive for numbness and diffuse "weak spells" - near syncope but no syncope. Negative for dizziness or headache.  No other specific complaints in a complete review of systems (except as listed in HPI above).      Objective:   Physical Exam  BP 120/68  Pulse 84  Temp(Src) 97 F (36.1 C) (Oral)  SpO2 94% Wt Readings from Last 3 Encounters:  11/28/12 218 lb (98.884 kg)  11/07/12 219 lb 9.6 oz (99.61 kg)  11/07/12 219 lb 9.6 oz (99.61 kg)   Constitutional:  He appears well-developed and well-nourished. Mild emotional distress. Sister at side  Neck: Normal range of motion. Neck supple. No JVD or  LAD present. No thyromegaly present.  Cardiovascular: Normal rate, regular rhythm and normal heart sounds.  No murmur heard. no BLE edema Pulmonary/Chest: Effort normal and breath sounds normal. No respiratory distress. no wheezes.  Musculoskeletal: min atrophy of proximal right forearm muscles, volar surface - but normal range of motion at right elbow right wrist and right fingers. Ligamentous function intact. Neurological: Numbness median distribution of right hand but equal bilateral hand grip strength. he is alert and oriented to person, place, and time. No cranial nerve deficit. Speech fluent, no dysarthria. Coordination and gait are cautious but normal.  Psychiatric: he has an anxious and slightly dysphoric mood. behavior is normal. Judgment and thought content normal.    Lab Results  Component Value Date   WBC 10.9* 11/16/2012   HGB 12.1* 11/16/2012   HCT 35.8* 11/16/2012   PLT 386 11/16/2012   CHOL 119 03/14/2012   TRIG 76.0 03/14/2012   HDL 30.20* 03/14/2012   LDLDIRECT 114* 03/21/2010   ALT 21 11/02/2012   AST 21 11/02/2012   NA 134* 11/16/2012   K 4.4 11/16/2012   CL 99 11/16/2012   CREATININE 0.77 11/16/2012   BUN 23 11/16/2012   CO2 23 11/16/2012   TSH 0.95 02/16/2012   PSA 0.47 01/02/2010   INR 1.19 11/01/2012   HGBA1C 6.0 03/14/2012       Assessment & Plan:   Near syncope - ?overtx of HTN, ?arrthymia - HD stable today, CV and neuro exam benign ER eval 6/5 for similar symptoms reviewed - medically unremarkable Reduce antiHTN: hold ACEI until further notice, continue beta-blocker Arrange follow up with cards (pt requests same)  Numbness - may be related to anxiety? Has seen neuro for same, follow up  For NCS as planned and work on anxiety mgmt - see below  Episodic vertigo - chronic hx same, exac by anxiety Neuro exam benign, ENT with chronic allergies, no acute problem 'refer to vestibular rehab  Also see problem list. Medications and labs reviewed today.  Time spent with pt/sister  today 42 minutes, greater than 50% time spent counseling patient on AAA repair, numbness, near syncope symptoms and anxiety  - Also medication rec and review of prior records

## 2012-12-05 NOTE — Patient Instructions (Signed)
It was good to see you today. We have reviewed your interval records today Test(s) ordered today. Your results will be released to MyChart (or called to you) after review, usually within 72hours after test completion. If any changes need to be made, you will be notified at that same time. Medications reviewed and updated - stop Vicodin and Flomax, hold lisinopril until further notice, Resume Flonase spray and start amitriptyline at bedtime Your prescription(s) have been submitted to your pharmacy. Please take as directed and contact our office if you believe you are having problem(s) with the medication(s). we'll make referral to vestibular rehabilitation and schedule appointment with Dr. Eden Emms. Our office will contact you regarding appointment(s) once made. Please schedule followup in 3 months, call sooner if problems.

## 2012-12-05 NOTE — Assessment & Plan Note (Signed)
A1c of 7.5 early September 2013 when admitted for aortic dissection Treated with Lantus and sliding scale insulin, also suspect exacerbation related to steroid use in hospital Off glyburide since 03/2012 - recheck a1c now and prn  Lab Results  Component Value Date   HGBA1C 5.9 12/05/2012

## 2012-12-05 NOTE — Assessment & Plan Note (Signed)
Increase symptoms since 02/2012 dissection of TAA with emergent repair and 10/2012 AAA repair -  Prn xanax but poor sleep as uses only during day Agreeable to trial of amitriptyline qhs - erx done

## 2012-12-07 ENCOUNTER — Encounter: Payer: Self-pay | Admitting: *Deleted

## 2012-12-07 ENCOUNTER — Ambulatory Visit (INDEPENDENT_AMBULATORY_CARE_PROVIDER_SITE_OTHER): Payer: 59 | Admitting: Cardiovascular Disease

## 2012-12-07 VITALS — BP 123/70 | HR 71 | Wt 218.0 lb

## 2012-12-07 DIAGNOSIS — I714 Abdominal aortic aneurysm, without rupture: Secondary | ICD-10-CM

## 2012-12-07 DIAGNOSIS — F419 Anxiety disorder, unspecified: Secondary | ICD-10-CM

## 2012-12-07 DIAGNOSIS — I7101 Dissection of thoracic aorta: Secondary | ICD-10-CM

## 2012-12-07 DIAGNOSIS — F411 Generalized anxiety disorder: Secondary | ICD-10-CM

## 2012-12-07 NOTE — Patient Instructions (Signed)
Your physician wants you to follow-up in:   October  WITH  DR Eden Emms AND ECHO SAME DAY   You will receive a reminder letter in the mail two months in advance. If you don't receive a letter, please call our office to schedule the follow-up appointment. Your physician recommends that you continue on your current medications as directed. Please refer to the Current Medication list given to you today.  Your physician has requested that you have an echocardiogram. Echocardiography is a painless test that uses sound waves to create images of your heart. It provides your doctor with information about the size and shape of your heart and how well your heart's chambers and valves are working. This procedure takes approximately one hour. There are no restrictions for this procedure. DUE IN October

## 2012-12-07 NOTE — Assessment & Plan Note (Signed)
S/P repair F/U US per [facility].  Wound healed well

## 2012-12-07 NOTE — Assessment & Plan Note (Signed)
S/P repair with resuspended AV  F/U echo in October no AR on last one

## 2012-12-07 NOTE — Assessment & Plan Note (Addendum)
Significant issue.  On elavil which will help neuropathy  And sleep as well F/U Dr Felicity Coyer

## 2012-12-07 NOTE — Progress Notes (Signed)
Patient ID: [name], male   DOB: 1950-12-27, 62 y.o.   MRN: [id number] 62 yo with emergent type A disection repair 02/19/12 by PVT. Post op course complicated by DVT on 3 month course of coumadin. Had pericardial effusion and this is improved on recent echo reviewed from 9/30. No signs of tamponade. Previous smoker but has not restarted since surgery. Surgery 02/19/12 Status post repair of type a ascending dissection with Heaney shield graft used to replace ascending aorta and hemi-arch reconstruction, resuspension of aortic valve Has a residual 6cm infrarenal AAA Since surgery has had paresthesias in 4th and 5th digits on right hand Likely brachial plexus injury with axillary artery cannulation on right side. He had a multitude of questions Still with tingling in fingers and bicipital weakness  There is a family history of aneurysms and with presentation of AAA and thoracic disection I suggested that he should discuss with PVT/Leschber screening his cerebral circulation.  Discussed the resolution of his pericardial effusion and ongoing need to monitor his resuspended AV   Had stage AAA repair by Dr Hart Rochester 5/14  F/U venous duplex of LLE today showed resolution of thrombus and no DVT on left  Will stop coumadin.   Echo 04/13/12  Normal EF and no AR  Study Conclusions  - Left ventricle: The cavity size was normal. Wall thickness was increased in a pattern of mild LVH. Systolic function was normal. The estimated ejection fraction was in the range of 60% to 65%. Septal bounce possible consistent with prior surgery. Wall motion was normal; there were no regional wall motion abnormalities. - Aortic valve: There was no stenosis. - Aorta: Mildly dilated aortic root. Aortic root dimension:27mm (ED). - Mitral valve: Mildly calcified annulus. Trivial regurgitation. - Left atrium: The atrium was mildly dilated. - Right ventricle: The cavity size was normal. Systolic function was normal. - Right  atrium: The atrium was mildly dilated. - Pulmonary arteries: No complete TR doppler jet so unable to estimate PA systolic pressure. - Inferior vena cava: The vessel was normal in size; the respirophasic diameter changes were in the normal range (= 50%); findings are consistent with normal central venous pressure.    ROS: Denies fever, malais, weight loss, blurry vision, decreased visual acuity, cough, sputum, SOB, hemoptysis, pleuritic pain, palpitaitons, heartburn, abdominal pain, melena, lower extremity edema, claudication, or rash.  All other systems reviewed and negative  General: Affect appropriate Healthy:  appears stated age HEENT: normal Neck supple with no adenopathy JVP normal no bruits no thyromegaly Lungs clear with no wheezing and good diaphragmatic motion Heart:  S1/S2 systolic  murmur, no rub, gallop or click PMI normal Abdomen: benighn, BS positve, no tenderness, AAA scar no bruit.  No HSM or HJR Distal pulses intact with no bruits No edema Neuro non-focal Skin warm and dry No muscular weakness   Current Outpatient Prescriptions  Medication Sig Dispense Refill  . ALPRAZolam (XANAX) 1 MG tablet Take 0.25 mg by mouth 4 (four) times daily as needed for sleep or anxiety.      [name] amitriptyline (ELAVIL) 10 MG tablet Take 1 tablet (10 mg total) by mouth at bedtime.  30 tablet  3  . aspirin EC 81 MG tablet Take 81 mg by mouth daily with lunch.      . calcium carbonate (TUMS EX) 750 MG chewable tablet Chew 1 tablet by mouth daily.      . cholecalciferol (VITAMIN D) 1000 UNITS tablet Take 2,000 Units by mouth daily.      [name]  EPINEPHrine (EPI-PEN) 0.3 mg/0.3 mL DEVI Inject 0.3 mg into the muscle as needed (for anaphylaxis).       . fexofenadine (ALLEGRA) 180 MG tablet Take 90 mg by mouth 2 (two) times daily.       . fluticasone (FLONASE) 50 MCG/ACT nasal spray Place 2 sprays into the nose daily.  16 g  1  . metoprolol tartrate (LOPRESSOR) 25 MG tablet Take 25 mg by mouth 2  (two) times daily. At noon and at bedtime (after 11pm)      . montelukast (SINGULAIR) 10 MG tablet Take 5 mg by mouth 2 (two) times daily.      . Multiple Vitamin (MULTIVITAMIN WITH MINERALS) TABS Take 1 tablet by mouth daily.      [name] omega-3 acid ethyl esters (LOVAZA) 1 G capsule Take 1 g by mouth every evening.       [name] OVER THE COUNTER MEDICATION Apply 1 application topically daily as needed (for acne). Oxy 10 vanishing cream      . polyethylene glycol (MIRALAX / GLYCOLAX) packet Take 17 g by mouth daily.  30 each  1  . predniSONE (DELTASONE) 10 MG tablet Take 2.5 mg by mouth 2 (two) times daily as needed (for shortness of breath and vertigo from allergies).      . rosuvastatin (CRESTOR) 10 MG tablet Take 10 mg by mouth daily after supper.       . thiamine (VITAMIN B-1) 100 MG tablet Take 100 mg by mouth 2 (two) times daily at 10 AM and 5 PM.       . vitamin B-12 (CYANOCOBALAMIN) 500 MCG tablet Take 500-1,000 mcg by mouth 3 (three) times a week. Random days      . [DISCONTINUED] oxyCODONE (OXY IR/ROXICODONE) 5 MG immediate release tablet Take 1 tablet (5 mg total) by mouth every 4 (four) hours as needed.  30 tablet  0   No current facility-administered medications for this visit.    Allergies  Albuterol; Testosterone; Codeine; and Ultram  Electrocardiogram:  11/17/12  SR rate 78 normal  Assessment and Plan

## 2012-12-18 ENCOUNTER — Ambulatory Visit: Payer: 59 | Attending: Internal Medicine | Admitting: Physical Therapy

## 2012-12-18 ENCOUNTER — Other Ambulatory Visit: Payer: Self-pay | Admitting: Internal Medicine

## 2012-12-18 DIAGNOSIS — IMO0001 Reserved for inherently not codable concepts without codable children: Secondary | ICD-10-CM | POA: Insufficient documentation

## 2012-12-18 DIAGNOSIS — H8110 Benign paroxysmal vertigo, unspecified ear: Secondary | ICD-10-CM | POA: Insufficient documentation

## 2012-12-20 ENCOUNTER — Other Ambulatory Visit: Payer: Self-pay | Admitting: *Deleted

## 2012-12-20 ENCOUNTER — Encounter (INDEPENDENT_AMBULATORY_CARE_PROVIDER_SITE_OTHER): Payer: Commercial Managed Care - PPO

## 2012-12-20 ENCOUNTER — Ambulatory Visit (INDEPENDENT_AMBULATORY_CARE_PROVIDER_SITE_OTHER): Payer: Commercial Managed Care - PPO | Admitting: Neurology

## 2012-12-20 DIAGNOSIS — G5611 Other lesions of median nerve, right upper limb: Secondary | ICD-10-CM

## 2012-12-20 DIAGNOSIS — Z0289 Encounter for other administrative examinations: Secondary | ICD-10-CM

## 2012-12-20 DIAGNOSIS — G540 Brachial plexus disorders: Secondary | ICD-10-CM

## 2012-12-20 DIAGNOSIS — G5610 Other lesions of median nerve, unspecified upper limb: Secondary | ICD-10-CM

## 2012-12-20 MED ORDER — ALPRAZOLAM 1 MG PO TABS: 0.2500 mg | ORAL_TABLET | Freq: Four times a day (QID) | ORAL | Status: DC | PRN

## 2012-12-20 NOTE — Telephone Encounter (Signed)
Faxed script back to [place].../lmb 

## 2012-12-20 NOTE — Procedures (Signed)
  HISTORY:  [name] is a 62 year old gentleman who underwent surgery for an aortic dissection in September 2013. Following surgery, the patient had some numbness and weakness involving the right upper extremity and he was felt to have a brachial plexopathy associated with a stretch injury. The patient has had some improvement since that time, and he comes in for an evaluation today. The patient has some residual numbness in the hand, and some weakness as well. The patient denies any discomfort.  NERVE CONDUCTION STUDIES:  Nerve conduction studies were performed on the right upper extremity. The distal motor latencies for the right median and ulnar nerves were normal, with prolongation of the right radial motor latency. The motor amplitude for the right median nerve was low, but was normal for the right ulnar nerve and right radial nerve. The nerve conduction velocities for the right median and ulnar nerves were normal. There was prolongation of the F wave latencies for the right median and ulnar nerves. The sensory latencies for the right median, ulnar, and radial nerves were normal.  EMG STUDIES:  EMG study was performed on the right upper extremity:  The first dorsal interosseous muscle reveals 2 to 5 K units with decreased recruitment. No fibrillations or positive waves were noted. The abductor pollicis brevis muscle reveals 2 to 5 K units with moderately decreased recruitment. No fibrillations or positive waves were noted. The extensor indicis proprius muscle reveals 1 to 3 K units with full recruitment. No fibrillations or positive waves were noted. The pronator teres muscle reveals 2 to 6 K units with moderately decreased recruitment. No fibrillations or positive waves were noted. The biceps muscle reveals 1 to 2 K units with full recruitment. No fibrillations or positive waves were noted. The triceps muscle reveals 2 to 4 K units with full recruitment. No fibrillations or positive  waves were noted. The anterior deltoid muscle reveals 2 to 3 K units with full recruitment. No fibrillations or positive waves were noted. The cervical paraspinal muscles were tested at 2 levels. No abnormalities of insertional activity were seen at either level tested. There was good relaxation.   IMPRESSION:  Nerve conduction studies of the right upper extremity shows evidence of a primarily motor involvement of the right median nerve. EMG evaluation of the right upper extremity shows findings consistent primarily with a proximal right median neuropathy suggestive of injury at the ligament of Struthers. Changes are chronic and stable in nature, moderate to severe in severity. No evidence of a right cervical radiculopathy is seen. Given this patient's history, the patient may have suffered a "double crush syndrome" involving the proximal median nerve following a prior brachial plexopathy. Ulnar and radial nerve innervated muscles appear to be relatively unaffected.  Marlan Palau MD 12/20/2012 4:33 PM  [facility] [address] [place], [place] [address]  Phone [phone] Fax [phone]

## 2012-12-21 ENCOUNTER — Encounter: Payer: Self-pay | Admitting: *Deleted

## 2012-12-21 NOTE — Progress Notes (Signed)
Quick Note:  Please mail report to patient and make a RV appointment. ______

## 2012-12-25 ENCOUNTER — Telehealth: Payer: Self-pay | Admitting: Internal Medicine

## 2012-12-25 NOTE — Telephone Encounter (Signed)
Rec'd records from Neuro[facility] forward 2 pages to [employer]

## 2012-12-26 ENCOUNTER — Telehealth: Payer: Self-pay | Admitting: Neurology

## 2012-12-26 NOTE — Telephone Encounter (Signed)
Needs RV - please note in documentation if this was done. CD

## 2012-12-27 NOTE — Telephone Encounter (Signed)
Patient was sched. 01/24/13.

## 2013-01-01 ENCOUNTER — Other Ambulatory Visit: Payer: Self-pay | Admitting: Internal Medicine

## 2013-01-08 NOTE — Telephone Encounter (Signed)
Left message for patient that the NCV showed nerve damage to the medial nerve, per Dr. Vickey Huger.  She will discuss results on his follow up appointment on 01-24-13.

## 2013-01-09 ENCOUNTER — Encounter: Payer: Self-pay | Admitting: *Deleted

## 2013-01-09 ENCOUNTER — Telehealth: Payer: Self-pay | Admitting: Neurology

## 2013-01-09 NOTE — Telephone Encounter (Signed)
Patient wants to know if he should be using any precautions since his NCV was abnormal? He is scheduled to be seen 01-24-13.  Please advise.

## 2013-01-09 NOTE — Telephone Encounter (Signed)
Please send patient a copy of the NCS and EMG report, which I had printed out .  He is not at risk to injury his nerve further with regular ADL ,  and I explained this to him today  on the telephone. Was very surprised when he told me that he had not the of conduction and EMG report by mail.

## 2013-01-24 ENCOUNTER — Ambulatory Visit (INDEPENDENT_AMBULATORY_CARE_PROVIDER_SITE_OTHER): Payer: Commercial Managed Care - PPO | Admitting: Neurology

## 2013-01-24 ENCOUNTER — Encounter: Payer: Self-pay | Admitting: Neurology

## 2013-01-24 VITALS — BP 142/73 | HR 84 | Resp 16 | Ht 75.0 in | Wt 228.0 lb

## 2013-01-24 DIAGNOSIS — G540 Brachial plexus disorders: Secondary | ICD-10-CM

## 2013-01-24 NOTE — Progress Notes (Signed)
[facility]  Provider:  Melvyn Davidson, M D  Referring Provider: Newt Lukes, MD Primary Care Physician:  Ryan Paci, MD  Chief Complaint  Patient presents with  . Follow-up    migraine,rm 10    HPI:  [name] is a 62 y.o. male  Is seen here as a  revisit  from [name] for  Brachial neuropathy   The [name] is a 62 year old Caucasian gentleman, who  underwent surgery for an aortic dissection in September 2013.  Following the surgery he had first some numbness and weakness involving the right upper extremity. The patient had experienced shortly at the improvement since that time when he reported that he still had some residual numbness of the dominant hand as well as some noticeable weakness no she had no discomfort associated no pain. I referred the patient for I nerve conduction study with EMG which was performed on 12-20-12 by Dr. Lesia Sago. The patient's blood conduction studies and EMGs confirm a proximal right median  neuropathy of the brachial plexus, located lately at the ligament of Struthers. The changes were already chronic and moderate to severe. A cervical radiculopathy and impingement of the exiting nerves was not noted it is likely that the patient suffered a so-called double crush syndrome doing has had surgery. It is important to note that the ulnar and radial nerve is of the right extremity are unaffected.  [name]  reports some tingling and he extends his right upper extremity which is a new phenomenon. It is slow not troubling him very much but it is noticeable. The area of dysesthesia affects the skin dermatome. It extends from the medial biceps through the antebrachial region into fingers of his right hand.  He had questions about using amitriptyline to treat the tingling numbness. It will  also help with sleep initiation and amitriptyline is used in patients with neck pain as well as neuropathy. His neuropathy seems also  to be related to the surgery and possibly the anesthesia perhaps the loss of blood flow.  The patient reports that his sudden loss of awareness spells that he had suffered around the time of the surgery has not occurred at least for the last four to six weeks. I have asked him to try to avoid decongestants. An EEG performed on 06-1612 was normal.  Review of Systems: Out of a complete 14 system review, the patient complains of only the following symptoms, and all other reviewed systems are negative. See above .  History   Social History  . Marital Status: Divorced    Spouse Name: N/A    Number of Children: 2  . Years of Education: acad. deg.   Occupational History  . internal med resident [employer]   Social History Main Topics  . Smoking status: Former Smoker -- 0.10 packs/day    Types: Cigarettes    Quit date: 02/18/2012  . Smokeless tobacco: Never Used     Comment: 10 cigs/day, quit 02/2012 after TAA emergent repair  . Alcohol Use: Yes     Comment: shot of Tequila every other day   . Drug Use: No  . Sexual Activity: Not on file   Other Topics Concern  . Not on file   Social History Narrative   Resides alone,consumes a small cup of caffeine daily.    Family History  Problem Relation Age of Onset  . Diabetes Mother   . Heart disease Maternal Uncle   . Heart disease Paternal  Uncle   . Heart disease Cousin   . Lung cancer Cousin     2 cousins  . Heart disease Maternal Grandmother     Past Medical History  Diagnosis Date  . ERECTILE DYSFUNCTION, ORGANIC   . Unspecified vitamin D deficiency   . HYPERLIPIDEMIA   . ASTHMA   . ALLERGIC RHINITIS   . Dissecting aortic aneurysm, thoracic 02/20/2012    s/p emergent surgical repair  . Hyperglycemia   . Anxiety   . OBSTRUCTIVE SLEEP APNEA 02/2008 sleep study    moderate & central- not able to tolerate CPAP   . Shortness of breath     related to anxiety   . GERD (gastroesophageal reflux disease)     related to diet &  anxiety   . COPD (chronic obstructive pulmonary disease)     also states he has been told that he has slight emphysema   . Arthritis     OA, hands, - thumbs- specifically  . DVT (deep venous thrombosis)     coumadin- x3 months , post op  . Anemia     post op, treated /w Fe  . Hypertension     pt. followwed by Dr. Eden Emms  . MIGRAINE HEADACHE     induced from physical , emotional stress , but sometimes occur randomly, also experiences BPPV  . Hypogonadism male   . Anticoagulant long-term use   . Brachial plexopathy     double crush injury with median nerve damage on the right dominant hand.   . Brachial plexopathy     double crush injury to the median nerve fascicle prox right     Past Surgical History  Procedure Laterality Date  . Nasal fx without repair    . Thoracic aortic aneurysm repair  02/20/2012    Procedure: THORACIC ASCENDING ANEURYSM REPAIR (AAA);  Surgeon: Kerin Perna, MD;  Location: [facility] OR;  Service: Open Heart Surgery;  Laterality: N/A;  . Abdominal aortic aneurysm repair N/A 11/01/2012    Procedure: ANEURYSM ABDOMINAL AORTIC REPAIR;  Surgeon: Pryor Ochoa, MD;  Location: [facility] OR;  Service: Vascular;  Laterality: N/A;  Resection and Grafting of Abdominal Aortic Aneurysm,(AORTA BI ILIAC)  . Foot arthrodesis, triple      Current Outpatient Prescriptions  Medication Sig Dispense Refill  . ALPRAZolam (XANAX) 1 MG tablet Take 0.5 tablets (0.5 mg total) by mouth 4 (four) times daily as needed for sleep or anxiety.  30 tablet  3  . amitriptyline (ELAVIL) 10 MG tablet Take 1 tablet (10 mg total) by mouth at bedtime.  30 tablet  3  . aspirin EC 81 MG tablet Take 81 mg by mouth daily with lunch.      . calcium carbonate (TUMS EX) 750 MG chewable tablet Chew 1 tablet by mouth daily.      . cholecalciferol (VITAMIN D) 1000 UNITS tablet Take 2,000 Units by mouth daily.      [name] EPINEPHrine (EPI-PEN) 0.3 mg/0.3 mL DEVI Inject 0.3 mg into the muscle as needed (for anaphylaxis).        . fexofenadine (ALLEGRA) 180 MG tablet Take 90 mg by mouth 2 (two) times daily.       . fluticasone (FLONASE) 50 MCG/ACT nasal spray Place 2 sprays into the nose daily.  16 g  1  . metoprolol tartrate (LOPRESSOR) 25 MG tablet Take 25 mg by mouth 2 (two) times daily. At noon and at bedtime (after 11pm)      . montelukast (SINGULAIR)  10 MG tablet TAKE 1 TABLET BY MOUTH ONCE DAILY  90 tablet  3  . Multiple Vitamin (MULTIVITAMIN WITH MINERALS) TABS Take 1 tablet by mouth daily.      [name] omega-3 acid ethyl esters (LOVAZA) 1 G capsule Take 1 g by mouth every evening.       [name] OVER THE COUNTER MEDICATION Apply 1 application topically daily as needed (for acne). Oxy 10 vanishing cream      . polyethylene glycol (MIRALAX / GLYCOLAX) packet Take 17 g by mouth daily.  30 each  1  . predniSONE (DELTASONE) 10 MG tablet Take 2.5 mg by mouth 2 (two) times daily as needed (for shortness of breath and vertigo from allergies).      . rosuvastatin (CRESTOR) 10 MG tablet Take 10 mg by mouth daily after supper.       . thiamine (VITAMIN B-1) 100 MG tablet Take 100 mg by mouth 2 (two) times daily at 10 AM and 5 PM.       . vitamin B-12 (CYANOCOBALAMIN) 500 MCG tablet Take 500-1,000 mcg by mouth 3 (three) times a week. Random days      . [DISCONTINUED] oxyCODONE (OXY IR/ROXICODONE) 5 MG immediate release tablet Take 1 tablet (5 mg total) by mouth every 4 (four) hours as needed.  30 tablet  0   No current facility-administered medications for this visit.    Allergies as of 01/24/2013 - Review Complete 01/24/2013  Allergen Reaction Noted  . Albuterol Other (See Comments) 05/23/2012  . Testosterone Shortness Of Breath 02/16/2012  . Codeine Nausea And Vomiting 10/24/2012  . Ultram [tramadol] Nausea And Vomiting 11/16/2012    Vitals: BP 142/73  Pulse 84  Resp 16  Ht 6\' 3"  (1.905 m)  Wt 228 lb (103.42 kg)  BMI 28.5 kg/m2 Last Weight:  Wt Readings from Last 1 Encounters:  01/24/13 228 lb (103.42 kg)   Last  Height:   Ht Readings from Last 1 Encounters:  01/24/13 6\' 3"  (1.905 m)     The patient is awake, alert in no acute distress. Well developed and groomed. Her cephalic.  Lungs good auscultation. Of repair from September 2013 have healed well. The patient has normal final it extremities nor waxed and we'll joints,. Mental status is intact, cranial nerves intact, motor examination showed the right biceps atrophic and the median nerve damage reflected and antebrachial numbness of the right hand and I did and medial antebrachial. Sensory is otherwise intact to his and symmetric to light touch except for the right hand.  There was no ataxia or dysmetria but a weaker right grip strength is noted.  Gait and station and reflexes are unchanged.  The patient still has a good prognosis for further neurologic recovery his median nerve is damaged at the proximal level but not the neighboring ulna nerve radial nerves.  As I mentioned in my initial consultation this was likely a compresses and secondary ischemic development the cervical nerves were not affected but the physical forming the median nerve.  Her second problem migraines had initially bursa worsened since surgery the increased from 1 or 2 a year to  2  Migraines weekly and were associated with blurred vision changes,  even with vision loss once .  He has now had  4 or 5 a month since April he has been taking amitriptyline at a very low dose at night this seems to have helped.  I have reordered  occupational therapy with r upper extremity  evaluation  that was originally ordered on January 20th 2014.  After a normal EEG and no further spells of decreased level of consciousness I have positive any further workup for this condition unless the need arises again.  But there is a possibility that the patient had suffered some perfusion related diffuse brain injury at the time office emergency aortic surgery.

## 2013-01-26 LAB — BASIC METABOLIC PANEL
CO2: 22 mEq/L (ref 19–32)
Calcium: 9 mg/dL (ref 8.4–10.5)
Chloride: 104 mEq/L (ref 96–112)
Creat: 0.85 mg/dL (ref 0.50–1.35)
Glucose, Bld: 190 mg/dL — ABNORMAL HIGH (ref 70–99)

## 2013-01-26 NOTE — Telephone Encounter (Signed)
Patient came in for visit, was given copy of NCV/EMG during visit

## 2013-01-29 ENCOUNTER — Encounter: Payer: Self-pay | Admitting: Vascular Surgery

## 2013-01-30 ENCOUNTER — Encounter: Payer: Self-pay | Admitting: Vascular Surgery

## 2013-01-30 ENCOUNTER — Encounter: Payer: Self-pay | Admitting: *Deleted

## 2013-01-30 ENCOUNTER — Ambulatory Visit (INDEPENDENT_AMBULATORY_CARE_PROVIDER_SITE_OTHER): Payer: 59 | Admitting: Vascular Surgery

## 2013-01-30 VITALS — BP 152/77 | HR 81 | Resp 18 | Ht 76.0 in | Wt 228.0 lb

## 2013-01-30 DIAGNOSIS — Z48812 Encounter for surgical aftercare following surgery on the circulatory system: Secondary | ICD-10-CM

## 2013-01-30 DIAGNOSIS — I714 Abdominal aortic aneurysm, without rupture: Secondary | ICD-10-CM

## 2013-01-30 NOTE — Addendum Note (Signed)
Addended by: Adria Dill L on: 01/30/2013 04:27 PM   Modules accepted: Orders

## 2013-01-30 NOTE — Progress Notes (Signed)
Subjective:     Patient ID: [name], male   DOB: 07/31/1950, 62 y.o.   MRN: [id number]  HPI this 62 year old male returns for continued followup regarding his abdominal aortic aneurysm resection performed 11/01/2012. He also had a type a dissection which extended down into the left renal artery. He has done very well postoperatively. His serum creatinine last week was normal at 0.85. He has adequate urine output. He is generally weak has not regained his preoperative strength level but has no other specific complaints.   Review of Systems     Objective:   Physical Exam BP 152/77  Pulse 81  Resp 18  Ht 6\' 4"  (1.93 m)  Wt 228 lb (103.42 kg)  BMI 27.76 kg/m2  General well-developed well-nourished male no apparent stress alert and oriented x3 Chest no rhonchi or wheezing Abdomen no evidence of ventral hernia no tenderness 3+ femoral and posterior tibial pulses palpable bilaterally      Assessment:     doing well 3 months post resection grafting infrarenal abdominal aortic aneurysm with proximal type a aortic dissection in the left renal artery    Plan:     patient return in 8 months with CT angiogram of abdomen and chest to look at type a dissection and aortic graft

## 2013-01-31 ENCOUNTER — Encounter: Payer: Self-pay | Admitting: *Deleted

## 2013-01-31 IMAGING — CR DG CHEST 1V PORT
1 series · 1 of 1 positions shown · non-contrast
Comparison: 03/22/2012

CLINICAL DATA: Weakness.  Recent aortic dissection in February 2012.

PORTABLE CHEST - 1 VIEW

[AP]
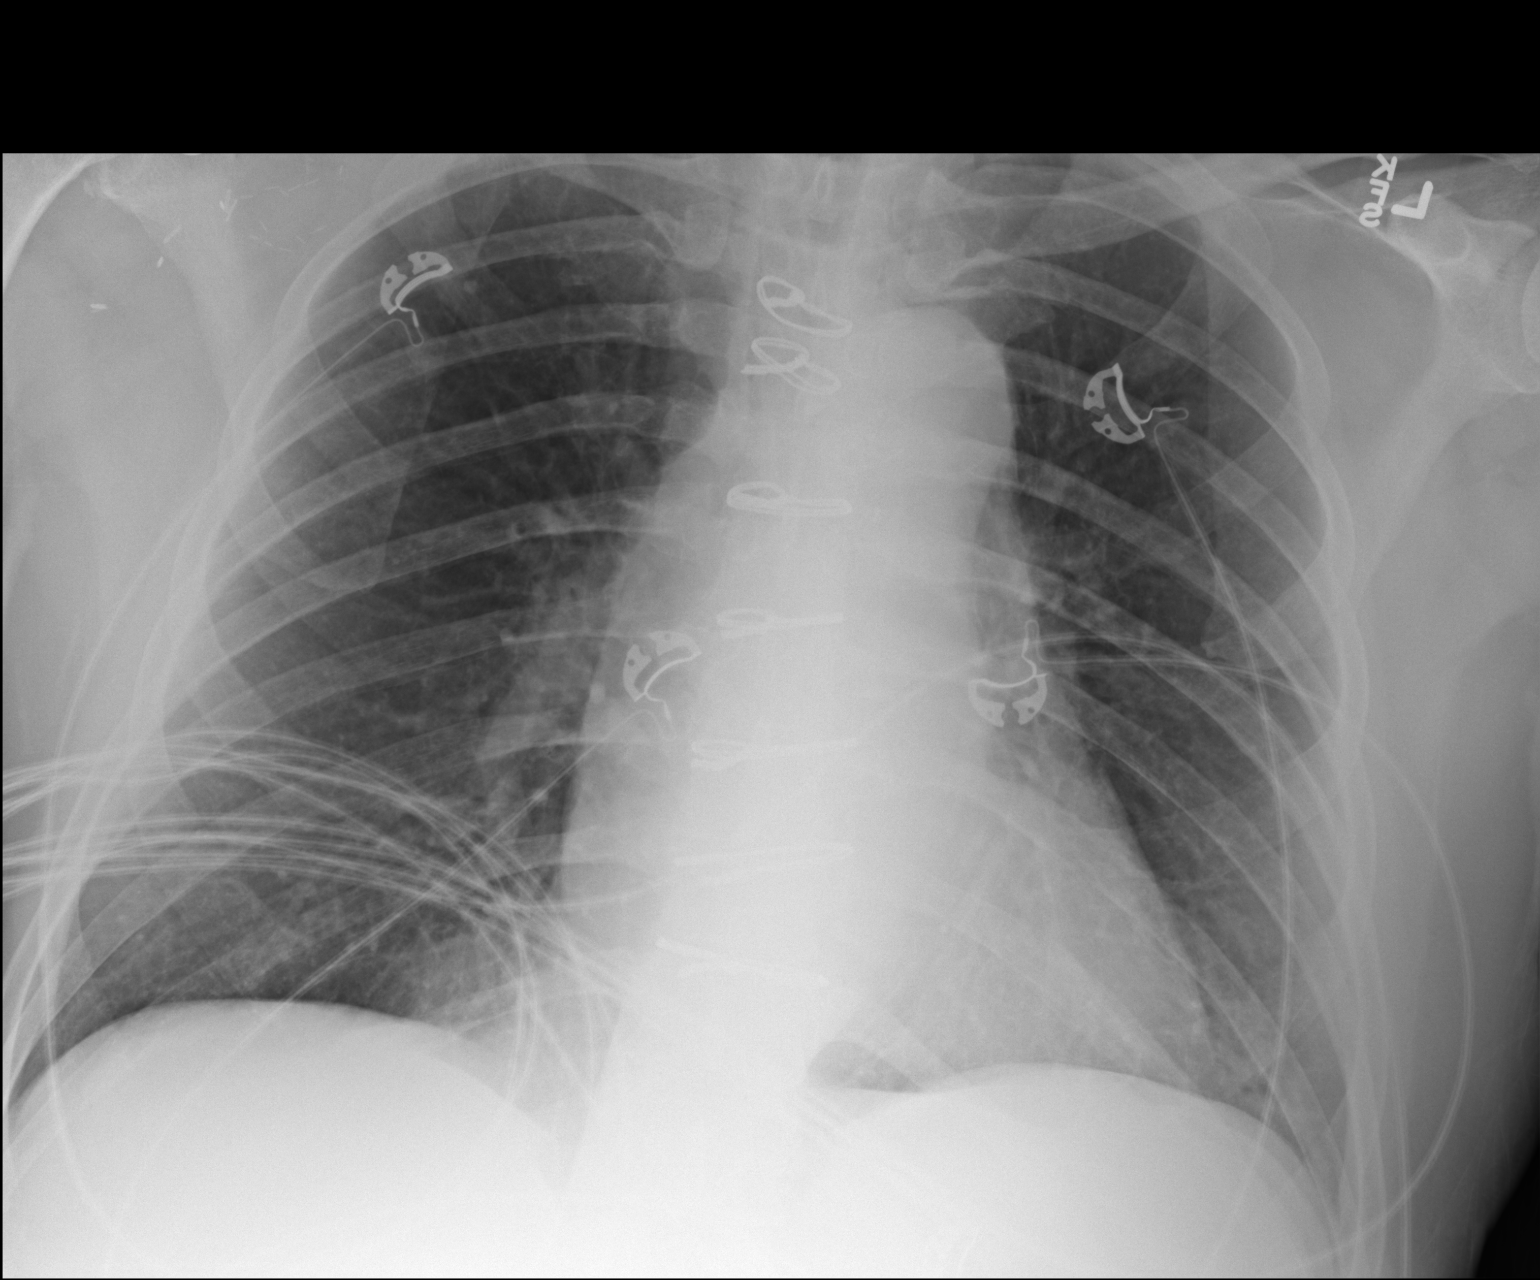

[1 of 1 positions shown; findings below may reference images not displayed]

FINDINGS: The heart size and pulmonary vascularity are normal and
the lungs are clear.  Chronic prominence of the thoracic aorta.
IMPRESSION: No acute abnormalities.

## 2013-02-08 ENCOUNTER — Ambulatory Visit: Payer: 59 | Attending: Internal Medicine | Admitting: Occupational Therapy

## 2013-02-08 DIAGNOSIS — H8110 Benign paroxysmal vertigo, unspecified ear: Secondary | ICD-10-CM | POA: Insufficient documentation

## 2013-02-08 DIAGNOSIS — IMO0001 Reserved for inherently not codable concepts without codable children: Secondary | ICD-10-CM | POA: Insufficient documentation

## 2013-02-14 ENCOUNTER — Ambulatory Visit: Payer: 59 | Attending: Internal Medicine | Admitting: Occupational Therapy

## 2013-02-14 DIAGNOSIS — H8110 Benign paroxysmal vertigo, unspecified ear: Secondary | ICD-10-CM | POA: Insufficient documentation

## 2013-02-14 DIAGNOSIS — IMO0001 Reserved for inherently not codable concepts without codable children: Secondary | ICD-10-CM | POA: Insufficient documentation

## 2013-02-20 ENCOUNTER — Ambulatory Visit: Payer: 59 | Admitting: Internal Medicine

## 2013-02-20 ENCOUNTER — Encounter: Payer: 59 | Admitting: Occupational Therapy

## 2013-02-21 ENCOUNTER — Encounter: Payer: Self-pay | Admitting: Internal Medicine

## 2013-02-21 ENCOUNTER — Ambulatory Visit (INDEPENDENT_AMBULATORY_CARE_PROVIDER_SITE_OTHER): Payer: 59 | Admitting: Internal Medicine

## 2013-02-21 VITALS — BP 130/78 | HR 76 | Temp 97.0°F | Wt 233.0 lb

## 2013-02-21 DIAGNOSIS — I714 Abdominal aortic aneurysm, without rupture, unspecified: Secondary | ICD-10-CM

## 2013-02-21 DIAGNOSIS — R739 Hyperglycemia, unspecified: Secondary | ICD-10-CM

## 2013-02-21 DIAGNOSIS — R5383 Other fatigue: Secondary | ICD-10-CM

## 2013-02-21 DIAGNOSIS — F419 Anxiety disorder, unspecified: Secondary | ICD-10-CM

## 2013-02-21 DIAGNOSIS — E785 Hyperlipidemia, unspecified: Secondary | ICD-10-CM

## 2013-02-21 DIAGNOSIS — F411 Generalized anxiety disorder: Secondary | ICD-10-CM

## 2013-02-21 DIAGNOSIS — R7309 Other abnormal glucose: Secondary | ICD-10-CM

## 2013-02-21 DIAGNOSIS — R5381 Other malaise: Secondary | ICD-10-CM

## 2013-02-21 MED ORDER — METOPROLOL TARTRATE 25 MG PO TABS: 25.0000 mg | ORAL_TABLET | Freq: Two times a day (BID) | ORAL | Status: DC

## 2013-02-21 NOTE — Assessment & Plan Note (Signed)
Increase symptoms since 02/2012 dissection of TAA with emergent repair and 10/2012 AAA repair -  Prn xanax but poor sleep as uses only during day began trial of amitriptyline qhs 08/2012 - generally improved  The current medical regimen is effective;  continue present plan and medications.

## 2013-02-21 NOTE — Assessment & Plan Note (Signed)
Check lipids Changed vytorin to crestor 2013  titrate as needed to keep LDL<100

## 2013-02-21 NOTE — Progress Notes (Signed)
Subjective:    Patient ID: [name], male    DOB: 1951-03-14, 61 y.o.   MRN: [id number]  HPI Here for follow up - reviewed interval events and chronic medical issues:  PVD: AAA repair 11/01/12 with L renal art involvement - Hx TAA with emergent surgical repair of thoracic aortic aneurysm dissection 02/20/12. Postop course 02/2012 complicated by encephalopathy and development of left lower leg DVT s/p 3 months warfarin (DVT felt provoked rFVIIa administration intraoperatively)  Hyperglycemia noted during hospitalization 02/2012 - briefly on OHA, DC's due to symptoms of hypoglycemia, has not been checking cbgs at home since, strong FH DM  possible R brachial plexus injury intraoperatively 02/2012 -abnormal appearance of right forearm and numbness of right hand- pending neuro follow up for repeat NCS  sleep apnea - sleep study 9/09 with moderate sleep apnea reviewed. However, patient intolerant of CPAP -   dyslipidemia - prev on lovaza, simva and lipitor tx for same- changed from vytorin tabs to crestor 02/2012 -the patient reports compliance with medication(s) as prescribed. Denies adverse side effects.  Past Medical History  Diagnosis Date  . ERECTILE DYSFUNCTION, ORGANIC   . Unspecified vitamin D deficiency   . HYPERLIPIDEMIA   . ASTHMA   . ALLERGIC RHINITIS   . Dissecting aortic aneurysm, thoracic 02/20/2012    s/p emergent surgical repair  . Hyperglycemia   . Anxiety   . OBSTRUCTIVE SLEEP APNEA 02/2008 sleep study    moderate & central- not able to tolerate CPAP   . Shortness of breath     related to anxiety   . GERD (gastroesophageal reflux disease)     related to diet & anxiety   . COPD (chronic obstructive pulmonary disease)     also states he has been told that he has slight emphysema   . Arthritis     OA, hands, - thumbs- specifically  . DVT (deep venous thrombosis)     coumadin- x3 months , post op  . Anemia     post op, treated /w Fe  . Hypertension     pt.  followwed by Dr. Eden Emms  . MIGRAINE HEADACHE     induced from physical , emotional stress , but sometimes occur randomly, also experiences BPPV  . Hypogonadism male   . Anticoagulant long-term use   . Brachial plexopathy     double crush injury with median nerve damage on the right dominant hand.   . Brachial plexopathy     double crush injury to the median nerve fascicle prox right     Review of Systems Constitutional: Negative for fever or weight change. +fatigue Respiratory: Negative for cough or shortness of breath.   Cardiovascular: Negative for chest pain or palpitations.       Objective:   Physical Exam BP 130/78  Pulse 76  Temp(Src) 97 F (36.1 C) (Oral)  Wt 233 lb (105.688 kg)  BMI 28.37 kg/m2  SpO2 95% Wt Readings from Last 3 Encounters:  02/21/13 233 lb (105.688 kg)  01/30/13 228 lb (103.42 kg)  01/24/13 228 lb (103.42 kg)   Constitutional:  He appears well-developed and well-nourished.  Cardiovascular: Normal rate, regular rhythm and normal heart sounds.  No murmur heard. no BLE edema Pulmonary/Chest: Effort normal and breath sounds normal. No respiratory distress. no wheezes.  Musculoskeletal: min atrophy of proximal right forearm muscles, volar surface - but normal range of motion at right elbow right wrist and right fingers. Ligamentous function intact. Neurological: Numbness median distribution of right hand  but equal bilateral hand grip strength. he is alert and oriented to person, place, and time. No cranial nerve deficit. Speech fluent, no dysarthria. Coordination and gait are cautious but normal.  Psychiatric: he has a mildly anxious and slightly dysphoric mood. behavior is normal. Judgment and thought content normal.    Lab Results  Component Value Date   WBC 10.9* 11/16/2012   HGB 12.1* 11/16/2012   HCT 35.8* 11/16/2012   PLT 386 11/16/2012   CHOL 119 03/14/2012   TRIG 76.0 03/14/2012   HDL 30.20* 03/14/2012   LDLDIRECT 114* 03/21/2010   ALT 21 11/02/2012    AST 21 11/02/2012   NA 139 01/26/2013   K 4.0 01/26/2013   CL 104 01/26/2013   CREATININE 0.85 01/26/2013   BUN 17 01/26/2013   CO2 22 01/26/2013   TSH 0.95 02/16/2012   PSA 0.47 01/02/2010   INR 1.19 11/01/2012   HGBA1C 5.9 12/05/2012       Assessment & Plan:   Fatigue - nonspecific symptoms/exam - check screening labs  Also see problem list. Medications and labs reviewed today.

## 2013-02-21 NOTE — Patient Instructions (Signed)
It was good to see you today. We have reviewed your interval records today Test(s) ordered today. Your results will be released to MyChart (or called to you) after review, usually within 72hours after test completion. If any changes need to be made, you will be notified at that same time. Medications reviewed and updated - no changes recommended today Please schedule followup in 6 months, call sooner if problems.

## 2013-02-21 NOTE — Assessment & Plan Note (Signed)
A1c of 7.5 early September 2013 when admitted for aortic dissection Treated with Lantus and sliding scale insulin, also suspect exacerbation related to steroid use in hospital Off glyburide since 03/2012 - recheck a1c now and prn  Lab Results  Component Value Date   HGBA1C 5.9 12/05/2012

## 2013-02-21 NOTE — Assessment & Plan Note (Signed)
S/p repair 10/2012 - following with [facility] and doing well

## 2013-02-22 ENCOUNTER — Ambulatory Visit: Payer: 59 | Admitting: Occupational Therapy

## 2013-02-27 ENCOUNTER — Other Ambulatory Visit: Payer: Self-pay | Admitting: Internal Medicine

## 2013-02-27 ENCOUNTER — Ambulatory Visit: Payer: 59 | Admitting: Occupational Therapy

## 2013-03-01 ENCOUNTER — Ambulatory Visit: Payer: 59 | Admitting: Occupational Therapy

## 2013-03-01 ENCOUNTER — Other Ambulatory Visit (INDEPENDENT_AMBULATORY_CARE_PROVIDER_SITE_OTHER): Payer: 59

## 2013-03-01 DIAGNOSIS — R5383 Other fatigue: Secondary | ICD-10-CM

## 2013-03-01 DIAGNOSIS — E785 Hyperlipidemia, unspecified: Secondary | ICD-10-CM

## 2013-03-01 DIAGNOSIS — R5381 Other malaise: Secondary | ICD-10-CM

## 2013-03-01 LAB — CBC WITH DIFFERENTIAL/PLATELET
Basophils Relative: 0.3 % (ref 0.0–3.0)
Eosinophils Absolute: 0.1 10*3/uL (ref 0.0–0.7)
Eosinophils Relative: 1.2 % (ref 0.0–5.0)
Lymphocytes Relative: 25.5 % (ref 12.0–46.0)
MCHC: 33.6 g/dL (ref 30.0–36.0)
Monocytes Absolute: 0.9 10*3/uL (ref 0.1–1.0)
Neutrophils Relative %: 61.9 % (ref 43.0–77.0)
Platelets: 173 10*3/uL (ref 150.0–400.0)
RBC: 5.08 Mil/uL (ref 4.22–5.81)
WBC: 7.7 10*3/uL (ref 4.5–10.5)

## 2013-03-01 LAB — LIPID PANEL
LDL Cholesterol: 51 mg/dL (ref 0–99)
Total CHOL/HDL Ratio: 3

## 2013-03-01 LAB — HEMOGLOBIN A1C: Hgb A1c MFr Bld: 7 % — ABNORMAL HIGH (ref 4.6–6.5)

## 2013-03-02 LAB — VITAMIN D 25 HYDROXY (VIT D DEFICIENCY, FRACTURES): Vit D, 25-Hydroxy: 43 ng/mL (ref 30–89)

## 2013-03-07 ENCOUNTER — Encounter: Payer: 59 | Admitting: Occupational Therapy

## 2013-03-09 ENCOUNTER — Encounter: Payer: 59 | Admitting: Occupational Therapy

## 2013-03-09 ENCOUNTER — Other Ambulatory Visit: Payer: Self-pay | Admitting: *Deleted

## 2013-03-09 DIAGNOSIS — I7101 Dissection of thoracic aorta: Secondary | ICD-10-CM

## 2013-03-09 DIAGNOSIS — I714 Abdominal aortic aneurysm, without rupture: Secondary | ICD-10-CM

## 2013-03-13 ENCOUNTER — Encounter: Payer: 59 | Admitting: Occupational Therapy

## 2013-03-21 ENCOUNTER — Ambulatory Visit (INDEPENDENT_AMBULATORY_CARE_PROVIDER_SITE_OTHER): Payer: 59 | Admitting: Internal Medicine

## 2013-03-21 ENCOUNTER — Encounter: Payer: Self-pay | Admitting: Internal Medicine

## 2013-03-21 VITALS — BP 132/70 | HR 68 | Temp 97.2°F

## 2013-03-21 DIAGNOSIS — F419 Anxiety disorder, unspecified: Secondary | ICD-10-CM

## 2013-03-21 DIAGNOSIS — F411 Generalized anxiety disorder: Secondary | ICD-10-CM

## 2013-03-21 DIAGNOSIS — J309 Allergic rhinitis, unspecified: Secondary | ICD-10-CM

## 2013-03-21 DIAGNOSIS — E119 Type 2 diabetes mellitus without complications: Secondary | ICD-10-CM

## 2013-03-21 DIAGNOSIS — G609 Hereditary and idiopathic neuropathy, unspecified: Secondary | ICD-10-CM

## 2013-03-21 DIAGNOSIS — G629 Polyneuropathy, unspecified: Secondary | ICD-10-CM | POA: Insufficient documentation

## 2013-03-21 MED ORDER — AZELASTINE HCL 0.1 % NA SOLN: 2.0000 | Freq: Two times a day (BID) | NASAL | Status: DC

## 2013-03-21 MED ORDER — LORAZEPAM 0.5 MG PO TABS: 0.5000 mg | ORAL_TABLET | Freq: Two times a day (BID) | ORAL | Status: DC | PRN

## 2013-03-21 MED ORDER — GABAPENTIN 100 MG PO CAPS: 100.0000 mg | ORAL_CAPSULE | Freq: Three times a day (TID) | ORAL | Status: DC

## 2013-03-21 MED ORDER — METFORMIN HCL ER 500 MG PO TB24: 500.0000 mg | ORAL_TABLET | Freq: Every day | ORAL | Status: DC

## 2013-03-21 NOTE — Assessment & Plan Note (Signed)
Continue max med tx, pred pak prn Advised on tobacco cessation Add Astelin nasal spray to minimize pred use with dx DM

## 2013-03-21 NOTE — Progress Notes (Signed)
Subjective:    Patient ID: [name], male    DOB: [birth year], 62 y.o.   MRN: [id number]  HPI Here for follow up labs - reviewed interval events and chronic medical issues:  PVD: AAA repair 11/01/12 with L renal art involvement - Hx TAA with emergent surgical repair of thoracic aortic aneurysm dissection 02/20/12. Postop course 02/2012 complicated by encephalopathy and development of left lower leg DVT s/p 3 months warfarin (DVT felt provoked rFVIIa administration intraoperatively)  DM2 - diet controlled -initially, hyperglycemia noted during hospitalization 02/2012 - briefly on OHA, DC's due to symptoms of hypoglycemia, has not been checking cbgs at home since; strong FH DM  s/p R brachial plexus injury intraoperatively 02/2012 -abnormal appearance of right forearm and numbness of right hand-  Ongoing neuro care for same, s/p repeat NCS confirming "permenent" damage  sleep apnea - sleep study 9/09 with moderate sleep apnea reviewed. However, patient intolerant of CPAP -   dyslipidemia - prev on lovaza, simva and lipitor tx for same- changed from vytorin tabs to crestor 02/2012 -the patient reports compliance with medication(s) as prescribed. Denies adverse side effects.  Past Medical History  Diagnosis Date  . ERECTILE DYSFUNCTION, ORGANIC   . Unspecified vitamin D deficiency   . HYPERLIPIDEMIA   . ASTHMA   . ALLERGIC RHINITIS   . Dissecting aortic aneurysm, thoracic 02/20/2012    s/p emergent surgical repair  . Hyperglycemia   . Anxiety   . OBSTRUCTIVE SLEEP APNEA 02/2008 sleep study    moderate & central- not able to tolerate CPAP   . Shortness of breath     related to anxiety   . GERD (gastroesophageal reflux disease)     related to diet & anxiety   . COPD (chronic obstructive pulmonary disease)     also states he has been told that he has slight emphysema   . Arthritis     OA, hands, - thumbs- specifically  . DVT (deep venous thrombosis)     coumadin- x3 months , post op   . Anemia     post op, treated /w Fe  . Hypertension     pt. followwed by Dr. Eden Emms  . MIGRAINE HEADACHE     induced from physical , emotional stress , but sometimes occur randomly, also experiences BPPV  . Hypogonadism male   . Anticoagulant long-term use   . Brachial plexopathy     double crush injury with median nerve damage on the right dominant hand.   . Brachial plexopathy     double crush injury to the median nerve fascicle prox right     Review of Systems Constitutional: Negative for fever or weight change. +fatigue Respiratory: Negative for cough or shortness of breath.   Cardiovascular: Negative for chest pain or palpitations.       Objective:   Physical Exam BP 132/70  Pulse 68  Temp(Src) 97.2 F (36.2 C) (Oral)  SpO2 96% Wt Readings from Last 3 Encounters:  02/21/13 233 lb (105.688 kg)  01/30/13 228 lb (103.42 kg)  01/24/13 228 lb (103.42 kg)   Constitutional:  He appears well-developed and well-nourished.  Cardiovascular: Normal rate, regular rhythm and normal heart sounds.  No murmur heard. no BLE edema Pulmonary/Chest: Effort normal and breath sounds normal. No respiratory distress. no wheezes.  Musculoskeletal: min atrophy of proximal right forearm muscles, volar surface - but normal range of motion at right elbow right wrist and right fingers. Ligamentous function intact. Neurological: Numbness median distribution of right  hand but equal bilateral hand grip strength. he is alert and oriented to person, place, and time. No cranial nerve deficit. Speech fluent, no dysarthria. Coordination and gait are cautious but normal.  Psychiatric: he has a mildly anxious and slightly dysphoric mood. behavior is normal. Judgment and thought content normal.    Lab Results  Component Value Date   WBC 7.7 03/01/2013   HGB 15.1 03/01/2013   HCT 44.7 03/01/2013   PLT 173.0 03/01/2013   CHOL 109 03/01/2013   TRIG 104.0 03/01/2013   HDL 37.10* 03/01/2013   LDLDIRECT 114*  03/21/2010   ALT 21 11/02/2012   AST 21 11/02/2012   NA 139 01/26/2013   K 4.0 01/26/2013   CL 104 01/26/2013   CREATININE 0.85 01/26/2013   BUN 17 01/26/2013   CO2 22 01/26/2013   TSH 0.73 03/01/2013   PSA 0.47 01/02/2010   INR 1.19 11/01/2012   HGBA1C 7.0* 03/01/2013       Assessment & Plan:   see problem list. Medications and labs reviewed today.

## 2013-03-21 NOTE — Patient Instructions (Signed)
It was good to see you today. We have reviewed your interval records today Medications reviewed and updated -  start extended release metformin 500 mg once daily and Astelin nasal spray for allergies Your prescription(s) have been submitted to your pharmacy. Please take as directed and contact our office if you believe you are having problem(s) with the medication(s). We'll make referral to, for nutrition consultation of diabetes, my office will call regarding this referral Please schedule followup in 3 months for diabetes mellitus, call sooner if problems.

## 2013-03-21 NOTE — Assessment & Plan Note (Signed)
Neuropathic pain, exacerbated by anxiety and history of injury to brachial plexus during axillary dissection for cardiopulmonary bypass support during intraoperative repair of aortic dissection Follows with neuro for same Concerned amitriptyline exacerbating hyperglycemia so we'll change to gabapentin for management of paresthesia

## 2013-03-21 NOTE — Assessment & Plan Note (Signed)
Initially dx 02/2012 during hospitalization - a1c 7.2 02/2012 Began OHA x 1 month, then DC'd due to hypoglycemia Exacerbated by pred use for allergic rhinitis and poor diet/exercise Discussion med options as diet alone ineffective Will start metformin xr 500mg  qd and refer to nutrition - requests [name]  follow up 3 mo

## 2013-03-21 NOTE — Assessment & Plan Note (Signed)
Significant symptoms, overlap with PTSD like Reaction to September 2013 spontaneous dissection of thoracic aortic aneurysm and emergent repair followed by may 2014 AAA repair Also exacerbated by and of long term relationship with girlfriend and death of mother Prn xanax but poor sleep as uses only during day began trial of amitriptyline qhs 08/2012 - generally improved, but stop 03/2013 due to ?side effects  Request to try lorazepam in place of alprazolam. Prescription provided today Also encouraged to followup on psychological or psychiatric care, patient will continue to consider Denies SI/HI

## 2013-03-23 ENCOUNTER — Other Ambulatory Visit: Payer: Self-pay | Admitting: Internal Medicine

## 2013-03-27 ENCOUNTER — Other Ambulatory Visit: Payer: Self-pay

## 2013-03-27 ENCOUNTER — Telehealth: Payer: Self-pay | Admitting: Vascular Surgery

## 2013-03-27 DIAGNOSIS — I714 Abdominal aortic aneurysm, without rupture: Secondary | ICD-10-CM

## 2013-03-27 NOTE — Telephone Encounter (Signed)
Message copied by Jena Gauss on Tue Mar 27, 2013 12:42 PM ------      Message from: Josephina Gip D      Created: Mon Mar 26, 2013  1:48 PM      Regarding: RE: CT sching       Marcelino Duster      No need to repeat CT of the chest if Dr. Morton Peters is following this            Thanks Dr. Hart Rochester      ----- Message -----         From: Jena Gauss         Sent: 03/26/2013  11:12 AM           To: Pryor Ochoa, MD      Subject: CT sching                                                Good Morning Dr. Hart Rochester,            On 01/30/13 you ordered an 8 m f/u w/ CTA ABD/PEL runoff and CTA Chest prior, however; pt is having a CTA Chest on 04/04/13 w/ Dr. Morton Peters.            Do you want to repeat the CTA Chest in 8 m?            Thank you,      Elon Jester       ------

## 2013-03-28 ENCOUNTER — Encounter: Payer: Self-pay | Admitting: Internal Medicine

## 2013-03-28 ENCOUNTER — Other Ambulatory Visit: Payer: Self-pay

## 2013-03-28 DIAGNOSIS — I714 Abdominal aortic aneurysm, without rupture: Secondary | ICD-10-CM

## 2013-03-28 DIAGNOSIS — Z48812 Encounter for surgical aftercare following surgery on the circulatory system: Secondary | ICD-10-CM

## 2013-03-29 ENCOUNTER — Encounter: Payer: Self-pay | Admitting: Vascular Surgery

## 2013-03-29 MED ORDER — AMITRIPTYLINE HCL 10 MG PO TABS: 10.0000 mg | ORAL_TABLET | Freq: Every day | ORAL | Status: DC

## 2013-03-29 NOTE — Telephone Encounter (Signed)
Please ask [facility] to followup on the status of the nutrition referral placed October 8. Thanks

## 2013-03-30 ENCOUNTER — Other Ambulatory Visit: Payer: Self-pay

## 2013-04-02 ENCOUNTER — Other Ambulatory Visit: Payer: Self-pay | Admitting: *Deleted

## 2013-04-04 ENCOUNTER — Encounter: Payer: Self-pay | Admitting: Cardiothoracic Surgery

## 2013-04-04 ENCOUNTER — Ambulatory Visit (INDEPENDENT_AMBULATORY_CARE_PROVIDER_SITE_OTHER): Payer: 59 | Admitting: Cardiothoracic Surgery

## 2013-04-04 ENCOUNTER — Ambulatory Visit
Admission: RE | Admit: 2013-04-04 | Discharge: 2013-04-04 | Disposition: A | Discharge status: Home / Self Care | Payer: 59 | Source: Ambulatory Visit | Attending: Cardiothoracic Surgery | Admitting: Cardiothoracic Surgery

## 2013-04-04 VITALS — BP 137/84 | HR 70 | Resp 16 | Ht 76.0 in | Wt 233.0 lb

## 2013-04-04 DIAGNOSIS — Z09 Encounter for follow-up examination after completed treatment for conditions other than malignant neoplasm: Secondary | ICD-10-CM

## 2013-04-04 DIAGNOSIS — I71019 Dissection of thoracic aorta, unspecified: Secondary | ICD-10-CM

## 2013-04-04 DIAGNOSIS — I7101 Dissection of thoracic aorta: Secondary | ICD-10-CM

## 2013-04-04 DIAGNOSIS — I7100 Dissection of unspecified site of aorta: Secondary | ICD-10-CM

## 2013-04-04 MED ORDER — IOHEXOL 350 MG/ML SOLN
80.0000 mL | Freq: Once | INTRAVENOUS | Status: AC | PRN
  Administered 2013-04-04: 80 mL via INTRAVENOUS

## 2013-04-04 NOTE — Progress Notes (Signed)
PCP is Rene Paci, MD Referring Provider is Newt Lukes, MD  Chief Complaint  Patient presents with  . Routine Post Op    6 month f/u with CTA CHEST    HPI: One year followup after repair of type A dissection with a 30 mm Dacron graft and resuspension of the aortic valve. After hospital discharge he developed DVT and was on Coumadin for 3 months. He is now on aspirin 81 mg. He has no chest pain. He has residual right arm brachial plexus stretch injury with mild motor weakness. He has stopped smoking. Several months after repair of his ascending dissection he had open repair of a AAA by Dr. Hart Rochester.  CTA of the thoracic aorta today shows intact repair of the ascending aorta to the proximal arch. The false lumen of the descending thoracic aorta is thrombosed except for some faint areas of contrast seen in the mid descending thoracic aorta. The transverse diameter of the descending thoracic aorta is 4.4 cm and stable.  Past Medical History  Diagnosis Date  . ERECTILE DYSFUNCTION, ORGANIC   . Unspecified vitamin D deficiency   . HYPERLIPIDEMIA   . ASTHMA   . ALLERGIC RHINITIS   . Dissecting aortic aneurysm, thoracic 02/20/2012    s/p emergent surgical repair  . Hyperglycemia   . Anxiety   . OBSTRUCTIVE SLEEP APNEA 02/2008 sleep study    moderate & central- not able to tolerate CPAP   . GERD (gastroesophageal reflux disease)     related to diet & anxiety   . COPD (chronic obstructive pulmonary disease)     also states he has been told that he has slight emphysema   . Arthritis     OA, hands, - thumbs- specifically  . DVT (deep venous thrombosis)     coumadin- x3 months , post op  . Anemia     post op, treated /w Fe  . Hypertension     pt. followwed by Dr. Eden Emms  . MIGRAINE HEADACHE     induced from physical , emotional stress , but sometimes occur randomly, also experiences BPPV  . Hypogonadism male   . Brachial plexopathy     double crush injury with median nerve  damage on the right dominant hand.   . Brachial plexopathy     double crush injury to the median nerve fascicle prox right   . DM2 (diabetes mellitus, type 2)     Past Surgical History  Procedure Laterality Date  . Nasal fx without repair    . Thoracic aortic aneurysm repair  02/20/2012    Procedure: THORACIC ASCENDING ANEURYSM REPAIR (AAA);  Surgeon: Kerin Perna, MD;  Location: [facility] OR;  Service: Open Heart Surgery;  Laterality: N/A;  . Abdominal aortic aneurysm repair N/A 11/01/2012    Procedure: ANEURYSM ABDOMINAL AORTIC REPAIR;  Surgeon: Pryor Ochoa, MD;  Location[facility] OR;  Service: Vascular;  Laterality: N/A;  Resection and Grafting of Abdominal Aortic Aneurysm,(AORTA BI ILIAC)  . Foot arthrodesis, triple      Family History  Problem Relation Age of Onset  . Diabetes Mother   . Heart disease Maternal Uncle   . Heart disease Paternal Uncle   . Heart disease Cousin   . Lung cancer Cousin     2 cousins  . Heart disease Maternal Grandmother     Social History History  Substance Use Topics  . Smoking status: Former Smoker -- 0.10 packs/day    Types: Cigarettes    Quit date:  02/18/2012  . Smokeless tobacco: Never Used     Comment: 10 cigs/day, quit 02/2012 after TAA emergent repair  . Alcohol Use: Yes     Comment: shot of Tequila every other day     Current Outpatient Prescriptions  Medication Sig Dispense Refill  . ALPRAZolam (XANAX) 1 MG tablet Take 0.5 tablets (0.5 mg total) by mouth 4 (four) times daily as needed for sleep or anxiety.  30 tablet  3  . amitriptyline (ELAVIL) 10 MG tablet Take 1 tablet (10 mg total) by mouth at bedtime.  30 tablet  3  . aspirin EC 81 MG tablet Take 81 mg by mouth daily with lunch.      [name] azelastine (ASTELIN) 137 MCG/SPRAY nasal spray Place 2 sprays into the nose 2 (two) times daily. Use in each nostril as directed  30 mL  2  . calcium carbonate (TUMS EX) 750 MG chewable tablet Chew 1 tablet by mouth daily.      . cholecalciferol  (VITAMIN D) 1000 UNITS tablet Take 2,000 Units by mouth daily.      [name] EPINEPHrine (EPI-PEN) 0.3 mg/0.3 mL DEVI Inject 0.3 mg into the muscle as needed (for anaphylaxis).       . fexofenadine (ALLEGRA) 180 MG tablet Take 90 mg by mouth 2 (two) times daily.       . fluticasone (FLONASE) 50 MCG/ACT nasal spray Place 2 sprays into the nose daily.  16 g  1  . LORazepam (ATIVAN) 0.5 MG tablet Take 1 tablet (0.5 mg total) by mouth 2 (two) times daily as needed for anxiety.  30 tablet  1  . metFORMIN (GLUCOPHAGE XR) 500 MG 24 hr tablet Take 1 tablet (500 mg total) by mouth daily with breakfast.  90 tablet  3  . metoprolol tartrate (LOPRESSOR) 25 MG tablet Take 1 tablet (25 mg total) by mouth 2 (two) times daily.      . montelukast (SINGULAIR) 10 MG tablet TAKE 1 TABLET BY MOUTH ONCE DAILY  90 tablet  3  . Multiple Vitamin (MULTIVITAMIN WITH MINERALS) TABS Take 1 tablet by mouth daily.      [name] omega-3 acid ethyl esters (LOVAZA) 1 G capsule TAKE 1 CAPSULE BY MOUTH 2 TIMES DAILY.  180 capsule  3  . OVER THE COUNTER MEDICATION Apply 1 application topically daily as needed (for acne). Oxy 10 vanishing cream      . polyethylene glycol powder (GLYCOLAX/MIRALAX) powder TAKE 17G BY MOUTH DAILY.  527 g  1  . predniSONE (DELTASONE) 10 MG tablet Take 2.5 mg by mouth 2 (two) times daily as needed (for shortness of breath and vertigo from allergies).      . rosuvastatin (CRESTOR) 10 MG tablet Take 10 mg by mouth daily after supper.       . thiamine (VITAMIN B-1) 100 MG tablet Take 100 mg by mouth 2 (two) times daily at 10 AM and 5 PM.       . vitamin B-12 (CYANOCOBALAMIN) 500 MCG tablet Take 500-1,000 mcg by mouth daily. Random days      . [DISCONTINUED] oxyCODONE (OXY IR/ROXICODONE) 5 MG immediate release tablet Take 1 tablet (5 mg total) by mouth every 4 (four) hours as needed.  30 tablet  0   No current facility-administered medications for this visit.    Allergies  Allergen Reactions  . Albuterol Other (See  Comments)    Panic attack  . Testosterone Shortness Of Breath    Medication:Androgel Pt states trouble breathing at  night  . Codeine Nausea And Vomiting  . Gabapentin     "drunk" feeling  . Ultram [Tramadol] Nausea And Vomiting    Review of Systems he complains of some dizziness which has been treated by neurology.  BP 137/84  Pulse 70  Resp 16  Ht 6\' 4"  (1.93 m)  Wt 233 lb (105.688 kg)  BMI 28.37 kg/m2  SpO2 97% Physical Exam Alert and comfortable HEENT normocephalic pupils equal Neck with good pulses no JVD Thorax clear breath sounds bilaterally well-healed sternotomy Extremities good pulses no significant focal motor problems Cardiac regular rhythm without murmur or gallop  Diagnostic Tests: CTA shows intact repair of the ascending aorta the proximal arch. False lumen of the descending thoracic aorta is mainly thrombosed except for a pain area of contrast in the mid descending thoracic aorta  Impression: Doing well one year following dissection repair. Continue nonsmoking, beta blocker for blood pressure control and 81 mg aspirin and statin.  Plan: CTA of the thoracic aorta one year to followup the false lumen of the descending aorta

## 2013-04-05 ENCOUNTER — Other Ambulatory Visit: Payer: Self-pay | Admitting: Internal Medicine

## 2013-04-12 ENCOUNTER — Other Ambulatory Visit: Payer: Self-pay

## 2013-04-12 DIAGNOSIS — I712 Thoracic aortic aneurysm, without rupture: Secondary | ICD-10-CM

## 2013-04-13 ENCOUNTER — Other Ambulatory Visit: Payer: Self-pay | Admitting: Dietician

## 2013-04-13 ENCOUNTER — Encounter: Payer: Self-pay | Admitting: Internal Medicine

## 2013-04-13 DIAGNOSIS — E119 Type 2 diabetes mellitus without complications: Secondary | ICD-10-CM

## 2013-04-13 NOTE — Progress Notes (Signed)
Note order for diabetes training/education sent to [facility]. Patient requests appointment and has been scheduled for 04-16-13 at 10[facility]30 [facility]. Corrected referral entered for tracking. Please sign and close note.

## 2013-04-13 NOTE — Progress Notes (Signed)
Noted and agree. Thanks.

## 2013-04-16 ENCOUNTER — Other Ambulatory Visit: Payer: Self-pay | Admitting: Internal Medicine

## 2013-04-16 ENCOUNTER — Ambulatory Visit (INDEPENDENT_AMBULATORY_CARE_PROVIDER_SITE_OTHER): Payer: 59 | Admitting: Dietician

## 2013-04-16 VITALS — Wt 233.0 lb

## 2013-04-16 DIAGNOSIS — E119 Type 2 diabetes mellitus without complications: Secondary | ICD-10-CM

## 2013-04-16 MED ORDER — ALPRAZOLAM 1 MG PO TABS: 0.2500 mg | ORAL_TABLET | Freq: Four times a day (QID) | ORAL | Status: DC | PRN

## 2013-04-16 NOTE — Telephone Encounter (Signed)
Faxed script bck to [place]...lmb

## 2013-04-16 NOTE — Patient Instructions (Addendum)
Weight 233# Blood sugar: 145 mg/dl 2-3 hours after breakfast  I will request blood sugar testing supplies.   You are going to use your meter to do event specific monitoring.   Work on depression, stress and exercise.  Bring meter with you next visit in 3-4 weeks.

## 2013-04-16 NOTE — Progress Notes (Signed)
Medical Nutrition Therapy:  Appt start time: 1030 end time:  1140. Patient with newly diagnosed diabetes. He reports significant stressors in past year Assessment:  Primary concerns today: Blood sugar control.  Current BMI 28, CBG is 145 3 hours after breakfast today. Labs noted with A1C 7.0%,  HDL 37 Medicines note.  He started metformin Friday with supper and has not had any tolerance problems Usual eating pattern includes 3 meals and 2-3 snacks per day. Frequent foods include grits or cereal, fried foods, soup/stew, vegetables,  Fruits, 2% milk. .   24-hr recall: Reports difficulty sleeping for a while  B - instant grits x 2 packs, large sausage link   Snk -protein shake and fruit  L ( PM)- salad, sandwich, vegetables   D ( PM)- Beef stew, fried shrimp  and salad.   Usual physical activity includes walks daily 15 minutes or more at work, stationary bike for at least 15 minutes on weekends.  Progress Towards Goal(s):  In progress.   Nutritional Diagnosis:  NB-1.1 Food and nutrition-related knowledge deficit As related to lack of prior exposure to diabetes meal planning.  As evidenced by his report and newly diagnosed diabetes.    Intervention:  Nutrition education about diabetes meal planning (lower fat cooking methods and food choices, incorporating whole grains, using plate method to plan well balanced meals), self monitoring (how to use accu chek nano meter, target blood sugars for pre and post meals) and  importance of exercise to blood sugar control. Recommend periodic self monitoring as desired by patient to learn about how foods/medicine and exercise affect blood sugars,  5-7% weight loss and daily walking for 30 minutes .  Coordination of care- request diabetes self monitoring supplies-  Link to Wellness visit.  Monitoring/Evaluation:  Dietary intake, exercise, blood sugars, and body weight in 4 week(s) .

## 2013-04-19 ENCOUNTER — Other Ambulatory Visit: Payer: Self-pay | Admitting: *Deleted

## 2013-04-19 ENCOUNTER — Other Ambulatory Visit: Payer: Self-pay

## 2013-04-19 ENCOUNTER — Telehealth: Payer: Self-pay | Admitting: Internal Medicine

## 2013-04-19 DIAGNOSIS — I714 Abdominal aortic aneurysm, without rupture: Secondary | ICD-10-CM

## 2013-04-19 DIAGNOSIS — I70219 Atherosclerosis of native arteries of extremities with intermittent claudication, unspecified extremity: Secondary | ICD-10-CM

## 2013-04-19 NOTE — Telephone Encounter (Signed)
04/19/2013 pt needs a referral to Dr. Hart Rochester, vein and vascular, for testing.  Please ct pt.

## 2013-04-20 ENCOUNTER — Other Ambulatory Visit: Payer: Self-pay | Admitting: Dietician

## 2013-04-20 DIAGNOSIS — E119 Type 2 diabetes mellitus without complications: Secondary | ICD-10-CM

## 2013-04-20 MED ORDER — ACCU-CHEK FASTCLIX LANCETS MISC: 1.0000 | Freq: Every day | Status: DC

## 2013-04-20 MED ORDER — GLUCOSE BLOOD VI STRP: ORAL_STRIP | Status: DC

## 2013-04-20 NOTE — Telephone Encounter (Signed)
ordered

## 2013-04-20 NOTE — Telephone Encounter (Signed)
Patient requests diabetes testing supplies for Accu Chek Nano meter provided at recent visit be sent to [facility] pharmacy.

## 2013-04-20 NOTE — Telephone Encounter (Signed)
Notified pt referral has been place. Will received call from [facility] once appt has been made...lmb

## 2013-05-02 ENCOUNTER — Ambulatory Visit: Payer: 59 | Admitting: Cardiothoracic Surgery

## 2013-05-02 ENCOUNTER — Other Ambulatory Visit: Payer: Self-pay | Admitting: Vascular Surgery

## 2013-05-02 ENCOUNTER — Other Ambulatory Visit: Payer: 59

## 2013-05-02 DIAGNOSIS — M79609 Pain in unspecified limb: Secondary | ICD-10-CM

## 2013-05-14 ENCOUNTER — Encounter: Payer: Self-pay | Admitting: Vascular Surgery

## 2013-05-15 ENCOUNTER — Encounter: Payer: Self-pay | Admitting: Vascular Surgery

## 2013-05-15 ENCOUNTER — Ambulatory Visit (HOSPITAL_COMMUNITY)
Admission: RE | Admit: 2013-05-15 | Discharge: 2013-05-15 | Disposition: A | Discharge status: Home / Self Care | Payer: 59 | Source: Ambulatory Visit | Attending: Vascular Surgery | Admitting: Vascular Surgery

## 2013-05-15 ENCOUNTER — Ambulatory Visit (INDEPENDENT_AMBULATORY_CARE_PROVIDER_SITE_OTHER): Payer: 59 | Admitting: Vascular Surgery

## 2013-05-15 VITALS — BP 153/82 | HR 63 | Resp 18 | Ht 76.0 in | Wt 233.0 lb

## 2013-05-15 DIAGNOSIS — M79604 Pain in right leg: Secondary | ICD-10-CM

## 2013-05-15 DIAGNOSIS — M79609 Pain in unspecified limb: Secondary | ICD-10-CM | POA: Insufficient documentation

## 2013-05-15 NOTE — Progress Notes (Signed)
Subjective:     Patient ID: [name], male   DOB: 02-25-1951, 62 y.o.   MRN: [id number]  HPI this 62 year old male was referred for cramping in both lower extremities today with ambulation. He states over the past month or so he has developed some calf discomfort with walking long distances. Her peripheral neuropathy which he states started after his aortic surgery. He had an abdominal aortic aneurysm resected by me in May of 2014. He previously had a Type A aortic dissection. He is followed by Dr. Donata Clay.. He states that the numbness in his feet remains unchanged and he does get a cold sensation in his legs.  Past Medical History  Diagnosis Date  . ERECTILE DYSFUNCTION, ORGANIC   . Unspecified vitamin D deficiency   . HYPERLIPIDEMIA   . ASTHMA   . ALLERGIC RHINITIS   . Dissecting aortic aneurysm, thoracic 02/20/2012    s/p emergent surgical repair  . Hyperglycemia   . Anxiety   . OBSTRUCTIVE SLEEP APNEA 02/2008 sleep study    moderate & central- not able to tolerate CPAP   . GERD (gastroesophageal reflux disease)     related to diet & anxiety   . COPD (chronic obstructive pulmonary disease)     also states he has been told that he has slight emphysema   . Arthritis     OA, hands, - thumbs- specifically  . DVT (deep venous thrombosis)     coumadin- x3 months , post op  . Anemia     post op, treated /w Fe  . Hypertension     pt. followwed by Dr. Eden Emms  . MIGRAINE HEADACHE     induced from physical , emotional stress , but sometimes occur randomly, also experiences BPPV  . Hypogonadism male   . Brachial plexopathy     double crush injury with median nerve damage on the right dominant hand.   . Brachial plexopathy     double crush injury to the median nerve fascicle prox right   . DM2 (diabetes mellitus, type 2)     History  Substance Use Topics  . Smoking status: Former Smoker -- 0.10 packs/day    Types: Cigarettes    Quit date: 02/18/2012  . Smokeless tobacco:  Never Used     Comment: 10 cigs/day, quit 02/2012 after TAA emergent repair  . Alcohol Use: Yes     Comment: shot of Tequila every other day     Family History  Problem Relation Age of Onset  . Diabetes Mother   . Heart disease Maternal Uncle   . Heart disease Paternal Uncle   . Heart disease Cousin   . Lung cancer Cousin     2 cousins  . Heart disease Maternal Grandmother     Allergies  Allergen Reactions  . Albuterol Other (See Comments)    Panic attack  . Testosterone Shortness Of Breath    Medication:Androgel Pt states trouble breathing at night  . Codeine Nausea And Vomiting  . Gabapentin     "drunk" feeling  . Ultram [Tramadol] Nausea And Vomiting    Current outpatient prescriptions:ACCU-CHEK FASTCLIX LANCETS MISC, 1 each by Does not apply route daily. Dx code 250.00, Disp: 102 each, Rfl: 12;  ALPRAZolam (XANAX) 1 MG tablet, Take 0.5 tablets (0.5 mg total) by mouth 4 (four) times daily as needed for sleep or anxiety., Disp: 30 tablet, Rfl: 3;  amitriptyline (ELAVIL) 10 MG tablet, Take 1 tablet (10 mg total) by mouth at bedtime.,  Disp: 30 tablet, Rfl: 3 aspirin EC 81 MG tablet, Take 81 mg by mouth daily with lunch., Disp: , Rfl: ;  azelastine (ASTELIN) 137 MCG/SPRAY nasal spray, Place 2 sprays into the nose 2 (two) times daily. Use in each nostril as directed, Disp: 30 mL, Rfl: 2;  calcium carbonate (TUMS EX) 750 MG chewable tablet, Chew 1 tablet by mouth daily., Disp: , Rfl: ;  cholecalciferol (VITAMIN D) 1000 UNITS tablet, Take 2,000 Units by mouth daily., Disp: , Rfl:  CRESTOR 10 MG tablet, TAKE 1 TABLET (10 MG TOTAL) BY MOUTH DAILY., Disp: 90 tablet, Rfl: 3;  EPINEPHrine (EPI-PEN) 0.3 mg/0.3 mL DEVI, Inject 0.3 mg into the muscle as needed (for anaphylaxis). , Disp: , Rfl: ;  fexofenadine (ALLEGRA) 180 MG tablet, Take 90 mg by mouth 2 (two) times daily. , Disp: , Rfl: ;  fluticasone (FLONASE) 50 MCG/ACT nasal spray, PLACE 2 SPRAYS INTO THE NOSE DAILY., Disp: 16 g, Rfl:  3 glucose blood (ACCU-CHEK SMARTVIEW) test strip, Use to check blood sugar as instructed up to 1x per day dx code 250.00, Disp: 50 each, Rfl: 12;  LORazepam (ATIVAN) 0.5 MG tablet, Take 1 tablet (0.5 mg total) by mouth 2 (two) times daily as needed for anxiety., Disp: 30 tablet, Rfl: 1;  metFORMIN (GLUCOPHAGE XR) 500 MG 24 hr tablet, Take 1 tablet (500 mg total) by mouth daily with breakfast., Disp: 90 tablet, Rfl: 3 metoprolol tartrate (LOPRESSOR) 25 MG tablet, Take 1 tablet (25 mg total) by mouth 2 (two) times daily., Disp: , Rfl: ;  montelukast (SINGULAIR) 10 MG tablet, TAKE 1 TABLET BY MOUTH ONCE DAILY, Disp: 90 tablet, Rfl: 3;  Multiple Vitamin (MULTIVITAMIN WITH MINERALS) TABS, Take 1 tablet by mouth daily., Disp: , Rfl: ;  omega-3 acid ethyl esters (LOVAZA) 1 G capsule, TAKE 1 CAPSULE BY MOUTH 2 TIMES DAILY., Disp: 180 capsule, Rfl: 3 OVER THE COUNTER MEDICATION, Apply 1 application topically daily as needed (for acne). Oxy 10 vanishing cream, Disp: , Rfl: ;  polyethylene glycol powder (GLYCOLAX/MIRALAX) powder, TAKE 17G BY MOUTH DAILY., Disp: 527 g, Rfl: 1;  predniSONE (DELTASONE) 10 MG tablet, Take 2.5 mg by mouth 2 (two) times daily as needed (for shortness of breath and vertigo from allergies)., Disp: , Rfl:  rosuvastatin (CRESTOR) 10 MG tablet, Take 10 mg by mouth daily after supper. , Disp: , Rfl: ;  thiamine (VITAMIN B-1) 100 MG tablet, Take 100 mg by mouth 2 (two) times daily at 10 AM and 5 PM. , Disp: , Rfl: ;  vitamin B-12 (CYANOCOBALAMIN) 500 MCG tablet, Take 500-1,000 mcg by mouth daily. Random days, Disp: , Rfl:  [DISCONTINUED] oxyCODONE (OXY IR/ROXICODONE) 5 MG immediate release tablet, Take 1 tablet (5 mg total) by mouth every 4 (four) hours as needed., Disp: 30 tablet, Rfl: 0  BP 153/82  Pulse 63  Resp 18  Ht 6\' 4"  (1.93 m)  Wt 233 lb (105.688 kg)  BMI 28.37 kg/m2  Body mass index is 28.37 kg/(m^2).           Review of Systems denies chest pain, dyspnea on exertion,  PND, orthopnea. Does have a history of weakness in his arms and feet occasional migraine headaches dizziness and poor appetite. Also generalized weakness. Denies other symptoms and complete review of systems    Objective:   Physical Exam BP 153/82  Pulse 63  Resp 18  Ht 6\' 4"  (1.93 m)  Wt 233 lb (105.688 kg)  BMI 28.37 kg/m2  Gen.-alert and oriented  x3 in no apparent distress HEENT normal for age Lungs no rhonchi or wheezing Cardiovascular regular rhythm no murmurs carotid pulses 3+ palpable no bruits audible Abdomen soft nontender no palpable masses Musculoskeletal free of  major deformities Skin clear -no rashes Neurologic normal Lower extremities 3+ femoral and dorsalis pedis pulses palpable bilaterally with no edema  Today I ordered lower extremity arterial Doppler study which are reviewed and interpreted. Is totally normal. He has triphasic flow in both feet with ABIs of 1.2 on the right 1.18 on the left       Assessment:     Bilateral calf discomfort with ambulation-no evidence of arterial insufficiency as an etiology Occasionally patients with spinal stenosis will have symptoms mimicking vascular claudication but he has no history of spinal stenosis    Plan:     Have encouraged patient to try to increase his activity as tolerated. He is due to return to see me in April with CT angiogram of chest and abdomen 2 check on status of type a dissection. This was treated surgically by Dr.  Donata Clay and 2013

## 2013-05-28 ENCOUNTER — Other Ambulatory Visit: Payer: Self-pay | Admitting: *Deleted

## 2013-05-28 MED ORDER — LORAZEPAM 0.5 MG PO TABS: 0.5000 mg | ORAL_TABLET | Freq: Two times a day (BID) | ORAL | Status: DC | PRN

## 2013-05-28 NOTE — Telephone Encounter (Signed)
Faxed script back to [place].../lmb 

## 2013-06-18 ENCOUNTER — Other Ambulatory Visit: Payer: Self-pay | Admitting: Internal Medicine

## 2013-06-19 NOTE — Telephone Encounter (Signed)
Last filled 05/01/2013 last office visit 03/21/13--received under [facility] requests

## 2013-08-02 ENCOUNTER — Other Ambulatory Visit: Payer: Self-pay | Admitting: Internal Medicine

## 2013-08-22 ENCOUNTER — Other Ambulatory Visit: Payer: Self-pay | Admitting: *Deleted

## 2013-08-22 MED ORDER — METOPROLOL TARTRATE 25 MG PO TABS: 25.0000 mg | ORAL_TABLET | Freq: Two times a day (BID) | ORAL | Status: DC

## 2013-09-01 IMAGING — CR DG CHEST 1V PORT
1 series · 1 of 1 positions shown · non-contrast
Comparison: 04/12/2012

CLINICAL DATA: Evaluate line placement

PORTABLE CHEST - 1 VIEW

[AP]
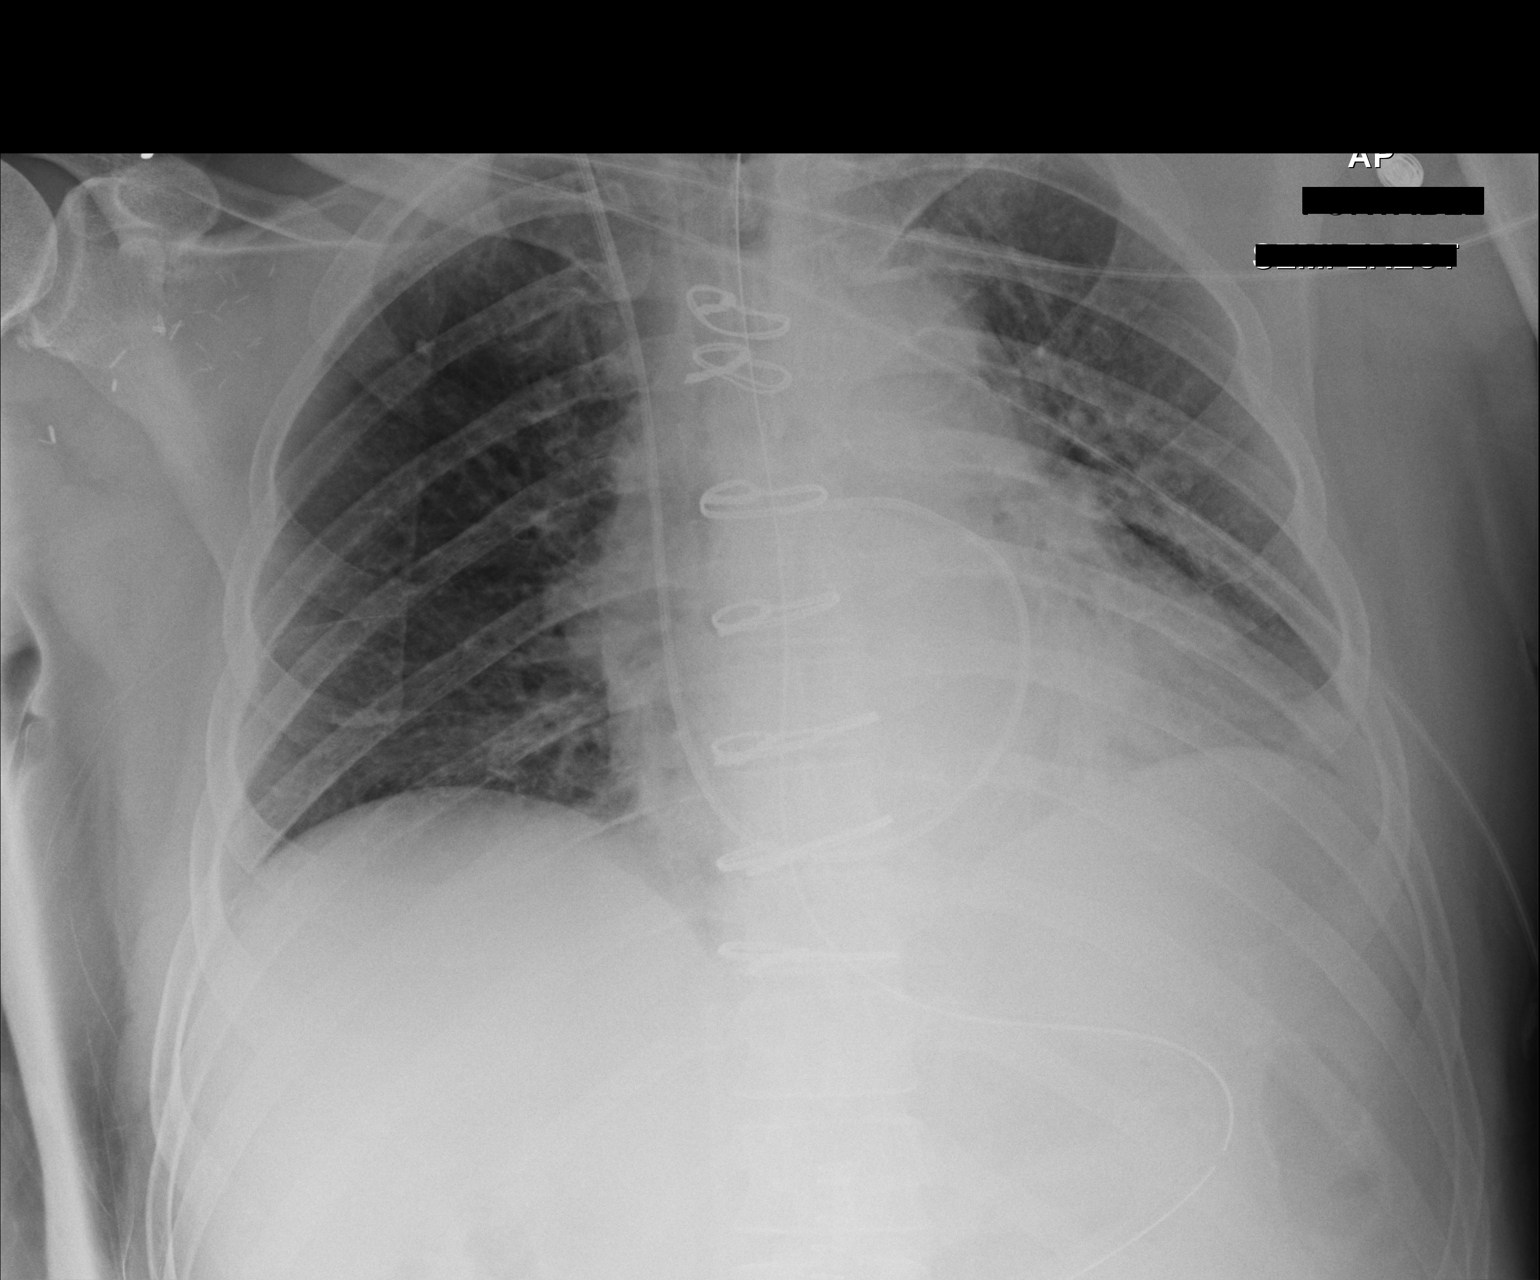

[1 of 1 positions shown; findings below may reference images not displayed]

FINDINGS: Right IJ PA catheter is noted with tip in the expected
location of the main pulmonary artery.

Nasogastric tube is identified with side port below GE junction.

Heart size appears normal.

Lung volumes are low.  There is pulmonary vascular congestion.
Granuloma is identified the right upper lobe.
IMPRESSION: 1.  Cardiac enlargement and pulmonary vascular congestion.
2.  PA catheter tip is in the expected location of the main
pulmonary artery.

## 2013-09-03 ENCOUNTER — Other Ambulatory Visit: Payer: Self-pay | Admitting: Internal Medicine

## 2013-09-03 NOTE — Telephone Encounter (Signed)
Faxed script back to [place].../lmb 

## 2013-09-10 ENCOUNTER — Other Ambulatory Visit: Payer: Self-pay | Admitting: Vascular Surgery

## 2013-09-17 ENCOUNTER — Other Ambulatory Visit: Payer: Self-pay | Admitting: Internal Medicine

## 2013-09-24 ENCOUNTER — Encounter: Payer: Self-pay | Admitting: Vascular Surgery

## 2013-09-25 ENCOUNTER — Encounter (HOSPITAL_COMMUNITY): Payer: 59

## 2013-09-25 ENCOUNTER — Encounter: Payer: Self-pay | Admitting: Vascular Surgery

## 2013-09-25 ENCOUNTER — Other Ambulatory Visit (HOSPITAL_COMMUNITY): Payer: Self-pay | Admitting: Radiology

## 2013-09-25 ENCOUNTER — Ambulatory Visit (HOSPITAL_COMMUNITY)
Admission: RE | Admit: 2013-09-25 | Discharge: 2013-09-25 | Disposition: A | Discharge status: Home / Self Care | Payer: 59 | Source: Ambulatory Visit | Attending: Vascular Surgery | Admitting: Vascular Surgery

## 2013-09-25 ENCOUNTER — Ambulatory Visit (INDEPENDENT_AMBULATORY_CARE_PROVIDER_SITE_OTHER): Payer: 59 | Admitting: Vascular Surgery

## 2013-09-25 ENCOUNTER — Other Ambulatory Visit: Payer: Self-pay | Admitting: *Deleted

## 2013-09-25 VITALS — BP 135/66 | HR 74 | Ht 76.0 in | Wt 232.0 lb

## 2013-09-25 DIAGNOSIS — I714 Abdominal aortic aneurysm, without rupture, unspecified: Secondary | ICD-10-CM | POA: Insufficient documentation

## 2013-09-25 DIAGNOSIS — Z48812 Encounter for surgical aftercare following surgery on the circulatory system: Secondary | ICD-10-CM

## 2013-09-25 DIAGNOSIS — I712 Thoracic aortic aneurysm, without rupture, unspecified: Secondary | ICD-10-CM | POA: Insufficient documentation

## 2013-09-25 DIAGNOSIS — Z5189 Encounter for other specified aftercare: Secondary | ICD-10-CM | POA: Insufficient documentation

## 2013-09-25 LAB — CREATININE, SERUM
CREATININE: 0.92 mg/dL (ref 0.50–1.35)
GFR calc Af Amer: >90 mL/min (ref >90)
GFR calc non Af Amer: 90 mL/min — ABNORMAL LOW (ref >90)

## 2013-09-25 LAB — BUN: BUN: 21 mg/dL (ref 6–23)

## 2013-09-25 MED ORDER — IOHEXOL 350 MG/ML SOLN
100.0000 mL | Freq: Once | INTRAVENOUS | Status: AC | PRN
Start: 2013-09-25 — End: 2013-09-25
  Administered 2013-09-25: 100 mL via INTRAVENOUS

## 2013-09-25 NOTE — Progress Notes (Signed)
Subjective:     Patient ID: [name], male   DOB: 10/09/1950, 63 y.o.   MRN: [id number]  HPI this 63 year old male returns for continued followup regarding his thoracic aortic dissection an infrarenal abdominal aortic aneurysm resection. He had a tight a resection repaired by Dr. Tharon Aquas trigt and in May of 2014 had an infrarenal abdominal aortic aneurysm resection. Today he returns with a CT angiogram of the abdomen and chest. He denies any abdominal or chest symptoms. He has had some mild tingling in both legs in the past and I continues on an intermittent basis. He does not have true claudication symptoms.  Past Medical History  Diagnosis Date  . ERECTILE DYSFUNCTION, ORGANIC   . Unspecified vitamin D deficiency   . HYPERLIPIDEMIA   . ASTHMA   . ALLERGIC RHINITIS   . Dissecting aortic aneurysm, thoracic 02/20/2012    s/p emergent surgical repair  . Hyperglycemia   . Anxiety   . OBSTRUCTIVE SLEEP APNEA 02/2008 sleep study    moderate & central- not able to tolerate CPAP   . GERD (gastroesophageal reflux disease)     related to diet & anxiety   . COPD (chronic obstructive pulmonary disease)     also states he has been told that he has slight emphysema   . Arthritis     OA, hands, - thumbs- specifically  . DVT (deep venous thrombosis)     coumadin- x3 months , post op  . Anemia     post op, treated /w Fe  . Hypertension     pt. followwed by Dr. Johnsie Cancel  . MIGRAINE HEADACHE     induced from physical , emotional stress , but sometimes occur randomly, also experiences BPPV  . Hypogonadism male   . Brachial plexopathy     double crush injury with median nerve damage on the right dominant hand.   . Brachial plexopathy     double crush injury to the median nerve fascicle prox right   . DM2 (diabetes mellitus, type 2)     History  Substance Use Topics  . Smoking status: Former Smoker -- 0.10 packs/day    Types: Cigarettes    Quit date: 02/18/2012  . Smokeless tobacco:  Never Used     Comment: 10 cigs/day, quit 02/2012 after TAA emergent repair  . Alcohol Use: Yes     Comment: shot of Tequila every other day     Family History  Problem Relation Age of Onset  . Diabetes Mother   . Heart disease Maternal Uncle   . Heart disease Paternal Uncle   . Heart disease Cousin   . Lung cancer Cousin     2 cousins  . Heart disease Maternal Grandmother     Allergies  Allergen Reactions  . Albuterol Other (See Comments)    Panic attack  . Testosterone Shortness Of Breath    Medication:Androgel Pt states trouble breathing at night  . Codeine Nausea And Vomiting  . Gabapentin     "drunk" feeling  . Ultram [Tramadol] Nausea And Vomiting    Current outpatient prescriptions:ACCU-CHEK FASTCLIX LANCETS MISC, 1 each by Does not apply route daily. Dx code 250.00, Disp: 102 each, Rfl: 12;  ALPRAZolam (XANAX) 1 MG tablet, TAKE 1/2 TABLET BY MOUTH FOUR TIMES DAILY AS NEEDED FOR SLEEP OR ANXIETY, Disp: 30 tablet, Rfl: 3;  amitriptyline (ELAVIL) 10 MG tablet, TAKE 1 TABLET BY MOUTH ONCE DAILY AT BEDTIME, Disp: 90 tablet, Rfl: 2 aspirin EC 81 MG  tablet, Take 81 mg by mouth daily with lunch., Disp: , Rfl: ;  azelastine (ASTELIN) 137 MCG/SPRAY nasal spray, PLACE 2 SPRAYS INTO EACH NOSTRIL 2 (TWO) TIMES DAILY., Disp: 30 mL, Rfl: 2;  calcium carbonate (TUMS EX) 750 MG chewable tablet, Chew 1 tablet by mouth daily., Disp: , Rfl: ;  cholecalciferol (VITAMIN D) 1000 UNITS tablet, Take 2,000 Units by mouth daily., Disp: , Rfl:  CRESTOR 10 MG tablet, TAKE 1 TABLET (10 MG TOTAL) BY MOUTH DAILY., Disp: 90 tablet, Rfl: 3;  EPINEPHrine (EPI-PEN) 0.3 mg/0.3 mL DEVI, Inject 0.3 mg into the muscle as needed (for anaphylaxis). , Disp: , Rfl: ;  EPIPEN 2-PAK 0.3 MG/0.3ML SOAJ injection, INJECT 0.3 MLS (0.3 MG TOTAL) INTO THE MUSCLE ONCE., Disp: 2 Device, Rfl: 0;  fexofenadine (ALLEGRA) 180 MG tablet, Take 90 mg by mouth 2 (two) times daily. , Disp: , Rfl:  fluticasone (FLONASE) 50 MCG/ACT nasal  spray, PLACE 2 SPRAYS INTO THE NOSE DAILY., Disp: 16 g, Rfl: 3;  LORazepam (ATIVAN) 0.5 MG tablet, Take 1 tablet (0.5 mg total) by mouth 2 (two) times daily as needed for anxiety., Disp: 30 tablet, Rfl: 1;  metFORMIN (GLUCOPHAGE XR) 500 MG 24 hr tablet, Take 1 tablet (500 mg total) by mouth daily with breakfast., Disp: 90 tablet, Rfl: 3 metoprolol tartrate (LOPRESSOR) 25 MG tablet, Take 1 tablet (25 mg total) by mouth 2 (two) times daily., Disp: 180 tablet, Rfl: 2;  montelukast (SINGULAIR) 10 MG tablet, TAKE 1 TABLET BY MOUTH ONCE DAILY, Disp: 90 tablet, Rfl: 3;  Multiple Vitamin (MULTIVITAMIN WITH MINERALS) TABS, Take 1 tablet by mouth daily., Disp: , Rfl: ;  omega-3 acid ethyl esters (LOVAZA) 1 G capsule, TAKE 1 CAPSULE BY MOUTH 2 TIMES DAILY., Disp: 180 capsule, Rfl: 3 OVER THE COUNTER MEDICATION, Apply 1 application topically daily as needed (for acne). Oxy 10 vanishing cream, Disp: , Rfl: ;  polyethylene glycol powder (GLYCOLAX/MIRALAX) powder, TAKE 17G BY MOUTH DAILY., Disp: 527 g, Rfl: 5;  predniSONE (DELTASONE) 10 MG tablet, Take 2.5 mg by mouth 2 (two) times daily as needed (for shortness of breath and vertigo from allergies)., Disp: , Rfl:  rosuvastatin (CRESTOR) 10 MG tablet, Take 10 mg by mouth daily after supper. , Disp: , Rfl: ;  thiamine (VITAMIN B-1) 100 MG tablet, Take 100 mg by mouth 2 (two) times daily at 10 AM and 5 PM. , Disp: , Rfl: ;  vitamin B-12 (CYANOCOBALAMIN) 500 MCG tablet, Take 500-1,000 mcg by mouth daily. Random days, Disp: , Rfl:  glucose blood (ACCU-CHEK SMARTVIEW) test strip, Use to check blood sugar as instructed up to 1x per day dx code 250.00, Disp: 50 each, Rfl: 12;  [DISCONTINUED] oxyCODONE (OXY IR/ROXICODONE) 5 MG immediate release tablet, Take 1 tablet (5 mg total) by mouth every 4 (four) hours as needed., Disp: 30 tablet, Rfl: 0  BP 135/66  Pulse 74  Ht 6\' 4"  (1.93 m)  Wt 232 lb (105.235 kg)  BMI 28.25 kg/m2  SpO2 97%  Body mass index is 28.25  kg/(m^2).          Review of Systems complains of occasional mild chest discomfort, discomforting calves on occasion, history of DVT, history of varicose veins, asthma, wheezing, weakness in arms and legs particularly right arm due to median nerve injury, occasional dizziness. All other systems negative complete review of system     Objective:   Physical Exam BP 135/66  Pulse 74  Ht 6\' 4"  (1.93 m)  Wt 232 lb (  105.235 kg)  BMI 28.25 kg/m2  SpO2 97%  Gen.-alert and oriented x3 in no apparent distress HEENT normal for age Lungs no rhonchi or wheezing Cardiovascular regular rhythm no murmurs carotid pulses 3+ palpable no bruits audible Abdomen soft nontender no palpable masses Musculoskeletal free of  major deformities Skin clear -no rashes Neurologic normal Lower extremities 3+ femoral and dorsalis pedis pulses palpable bilaterally with no edema  Data ordered a CT angiogram of the abdomen and chest and reviewed it by computer. I compared this to the October of 2014 CT angiogram of the chest which did not include the abdomen. Patient continues to have a true lumen which supplies the SMA, celiac axis, and right kidney. The false lumen fills retrograde from the left renal artery orifice up to about the diaphragm or slightly higher and it feeds at 80 intercostal branch which is keeping this patent. In diameter of the thoracic aorta has not changed-proximally 4.2 cm and of the suprarenal aorta about 3.6 cm. This all appears stable. The infrarenal aortic graft is widely patent with good flow into both iliac arteries.        Assessment:     I discussed these findings at great length with the patient who had multiple questions. The diameter of the thoracic and abdominal aorta is have not changed despite the fact that the false lumen continues to be patent being filled retrograde. I told him that the main concern would be if the diameter of the thoracic aorta or suprarenal aorta began to  enlarge. The retrograde filling of the false lumen which is supplying an intercostal branch could be a critical area of blood supply to the spinal cord.    Plan:     Once follow patient again in 6 months with CT angiogram of chest and abdomen to see if there has been any change. The patient developed sudden chest abdominal discomfort he knows to report to the emergency department immediately.

## 2013-09-27 ENCOUNTER — Other Ambulatory Visit: Payer: Self-pay

## 2013-09-27 DIAGNOSIS — Z48812 Encounter for surgical aftercare following surgery on the circulatory system: Secondary | ICD-10-CM

## 2013-09-27 DIAGNOSIS — I714 Abdominal aortic aneurysm, without rupture, unspecified: Secondary | ICD-10-CM

## 2013-09-27 NOTE — Addendum Note (Signed)
Addended by: Dorthula Rue L on: 09/27/2013 11:31 AM   Modules accepted: Orders

## 2013-10-02 ENCOUNTER — Other Ambulatory Visit (HOSPITAL_COMMUNITY): Payer: 59

## 2013-10-02 ENCOUNTER — Ambulatory Visit: Payer: 59 | Admitting: Vascular Surgery

## 2013-10-02 ENCOUNTER — Encounter (HOSPITAL_COMMUNITY): Payer: 59

## 2013-10-17 ENCOUNTER — Encounter: Payer: Self-pay | Admitting: Internal Medicine

## 2013-10-19 ENCOUNTER — Other Ambulatory Visit: Payer: Self-pay | Admitting: Internal Medicine

## 2013-11-23 ENCOUNTER — Encounter: Payer: Self-pay | Admitting: Internal Medicine

## 2013-12-06 ENCOUNTER — Other Ambulatory Visit: Payer: Self-pay

## 2013-12-06 MED ORDER — AZELASTINE HCL 0.1 % NA SOLN: NASAL | Status: DC

## 2013-12-12 ENCOUNTER — Encounter: Payer: Self-pay | Admitting: Internal Medicine

## 2013-12-12 ENCOUNTER — Other Ambulatory Visit (INDEPENDENT_AMBULATORY_CARE_PROVIDER_SITE_OTHER): Payer: 59

## 2013-12-12 ENCOUNTER — Ambulatory Visit (INDEPENDENT_AMBULATORY_CARE_PROVIDER_SITE_OTHER): Payer: 59 | Admitting: Internal Medicine

## 2013-12-12 VITALS — BP 138/78 | HR 84 | Temp 96.9°F | Wt 235.0 lb

## 2013-12-12 DIAGNOSIS — Z1159 Encounter for screening for other viral diseases: Secondary | ICD-10-CM

## 2013-12-12 DIAGNOSIS — E785 Hyperlipidemia, unspecified: Secondary | ICD-10-CM

## 2013-12-12 DIAGNOSIS — E119 Type 2 diabetes mellitus without complications: Secondary | ICD-10-CM

## 2013-12-12 DIAGNOSIS — R0989 Other specified symptoms and signs involving the circulatory and respiratory systems: Secondary | ICD-10-CM

## 2013-12-12 DIAGNOSIS — F411 Generalized anxiety disorder: Secondary | ICD-10-CM

## 2013-12-12 DIAGNOSIS — F419 Anxiety disorder, unspecified: Secondary | ICD-10-CM

## 2013-12-12 DIAGNOSIS — Z23 Encounter for immunization: Secondary | ICD-10-CM

## 2013-12-12 DIAGNOSIS — Z2911 Encounter for prophylactic immunotherapy for respiratory syncytial virus (RSV): Secondary | ICD-10-CM

## 2013-12-12 LAB — HEMOGLOBIN A1C: HEMOGLOBIN A1C: 6.7 % — AB (ref 4.6–6.5)

## 2013-12-12 NOTE — Progress Notes (Signed)
Pre visit review using our clinic review tool, if applicable. No additional management support is needed unless otherwise documented below in the visit note. 

## 2013-12-12 NOTE — Assessment & Plan Note (Signed)
Check lipids annually Changed vytorin to crestor 2013  titrate as needed to keep LDL<100

## 2013-12-12 NOTE — Progress Notes (Signed)
Subjective:    Patient ID: [name], male    DOB: [birth year], 63 y.o.   MRN: [id number]  HPI  Patient here for follow up - to review xrays/report from chiropractor Also reviewed chronic medical issues and interval medical events  Past Medical History  Diagnosis Date  . ERECTILE DYSFUNCTION, ORGANIC   . Unspecified vitamin D deficiency   . HYPERLIPIDEMIA   . ASTHMA   . ALLERGIC RHINITIS   . Dissecting aortic aneurysm, thoracic 02/20/2012    s/p emergent surgical repair  . Hyperglycemia   . Anxiety   . OBSTRUCTIVE SLEEP APNEA 02/2008 sleep study    moderate & central- not able to tolerate CPAP   . GERD (gastroesophageal reflux disease)     related to diet & anxiety   . COPD (chronic obstructive pulmonary disease)     also states he has been told that he has slight emphysema   . Arthritis     OA, hands, - thumbs- specifically  . DVT (deep venous thrombosis)     coumadin- x3 months , post op  . Anemia     post op, treated /w Fe  . Hypertension     pt. followwed by Dr. Johnsie Cancel  . MIGRAINE HEADACHE     induced from physical , emotional stress , but sometimes occur randomly, also experiences BPPV  . Hypogonadism male   . Brachial plexopathy     double crush injury with median nerve damage on the right dominant hand.   . Brachial plexopathy     double crush injury to the median nerve fascicle prox right   . DM2 (diabetes mellitus, type 2)     Review of Systems  Constitutional: Positive for fatigue. Negative for fever and unexpected weight change.  Respiratory: Negative for cough and shortness of breath.   Cardiovascular: Negative for palpitations and leg swelling.  Psychiatric/Behavioral: Positive for sleep disturbance and dysphoric mood (chronic). The patient is nervous/anxious (chronic, working w/ therapist).        Objective:   Physical Exam  BP 180/90  Pulse 84  Temp(Src) 96.9 F (36.1 C) (Oral)  Wt 235 lb (106.595 kg)  SpO2 97% Wt Readings from Last 3  Encounters:  12/12/13 235 lb (106.595 kg)  09/25/13 232 lb (105.235 kg)  05/15/13 233 lb (105.688 kg)   Constitutional: he appears well-developed and well-nourished. No distress.  Neck: L bruit appreciated -Normal range of motion. Neck supple. No JVD present. No thyromegaly present.  Cardiovascular: Normal rate, regular rhythm and normal heart sounds.  No murmur heard. No BLE edema. Pulmonary/Chest: Effort normal and breath sounds normal. No respiratory distress. he has no wheezes.  Psychiatric: he has a normal mood and affect. His behavior is normal. Judgment and thought content normal.   Lab Results  Component Value Date   WBC 7.7 03/01/2013   HGB 15.1 03/01/2013   HCT 44.7 03/01/2013   PLT 173.0 03/01/2013   GLUCOSE 190* 01/26/2013   CHOL 109 03/01/2013   TRIG 104.0 03/01/2013   HDL 37.10* 03/01/2013   LDLDIRECT 114* 03/21/2010   LDLCALC 51 03/01/2013   ALT 21 11/02/2012   AST 21 11/02/2012   NA 139 01/26/2013   K 4.0 01/26/2013   CL 104 01/26/2013   CREATININE 0.92 09/25/2013   BUN 21 09/25/2013   CO2 22 01/26/2013   TSH 0.73 03/01/2013   PSA 0.47 01/02/2010   INR 1.19 11/01/2012   HGBA1C 7.0* 03/01/2013    Ct Angio Abdomen W/cm &/  or Wo Contrast  09/25/2013   CLINICAL DATA:  63 year old male with history of abdominal aortic aneurysm status post open repair in May of 2014 and prior Stanford type A aortic dissection status post open repair in September of 2013.  EXAM: CT ANGIOGRAPHY CHEST, ABDOMEN AND PELVIS  TECHNIQUE: Multidetector CT imaging through the chest, abdomen and pelvis was performed using the standard protocol during bolus administration of intravenous contrast. Multiplanar reconstructed images and MIPs were obtained and reviewed to evaluate the vascular anatomy.  CONTRAST:  114mL OMNIPAQUE IOHEXOL 350 MG/ML SOLN  COMPARISON:  Most recent prior CTA of the chest 04/04/2013 ; most recent prior CTA abdomen and pelvis 10/10/2012  FINDINGS: CTA CHEST FINDINGS  VASCULAR  Surgical changes of  prior tube graft repair of the ascending thoracic aorta and hemi arch reconstruction without significant interval change. The transverse aorta remains ectatic with a maximal diameter of 4.2 cm which is unchanged compared to prior. The proximal descending thoracic aorta measures 3.8 cm, also unchanged. A residual Stanford type B dissection of the descending thoracic aorta is again noticed. There is persistent partial opacification of the false lumen. Maximal diameter of the dissected segment of the descending thoracic aorta is unchanged at 4.5 cm. The aortic valve appears to be a re-suspended native valve. There is thickening and calcification of the valve cusps. The origins of the great vessels remain widely patent with minimal atherosclerotic disease. The heart is borderline enlarged. There is some right ventricular and right atrial dilatation. No pericardial effusion. Atherosclerotic calcifications are noted throughout the visualized coronary arteries. Normal caliber main and central pulmonary arteries.  NON VASCULAR  Mediastinum: Unremarkable CT appearance of the thyroid gland. No suspicious mediastinal or hilar adenopathy. No soft tissue mediastinal mass. The thoracic esophagus is unremarkable.  Lungs/Pleura: Negative for pleural effusion. Combined paraseptal and centrilobular emphysema. Stable calcified granuloma in the right upper lobe. Evaluation for small pulmonary nodules slightly limited by respiratory motion. No suspicious nodule or mass identified. Mild dependent atelectasis and bronchial wall thickening. Otherwise, the lungs are clear.  Bones/Soft Tissues: Healed median sternotomy. No acute fracture or aggressive appearing lytic or blastic osseous lesion.  Review of the MIP images confirms the above findings.  CTA ABDOMEN AND PELVIS FINDINGS  VASCULAR  Aorta: Very similar appearance of the proximal abdominal aortic dissection flap. The false lumen has partially thrombosed compared to 10/10/2012,  particularly at the superior extent in the regions of the celiac artery origin. Dissection flap again extends into the mid aspect of the left renal artery without imaging evidence of significant flow limitation. The dissection flap demonstrates no distal propagation. Compared to the prior imaging, there has been interval open surgical repair of the previously identified abdominal aortic aneurysm with an aorto-biiliac tube graft and partial resection of the aneurysm sac. The proximal and distal anastomoses are intact without evidence of the dissection or new aneurysmal dilatation. Residual peripherally calcified collapsed aneurysm sac is present about the tube graft. There is no evidence of endoleak. The aorta just proximal to the proximal anastomosis measures 3.6 x 3.7 cm which is unchanged.  Celiac: Originates from the true lumen. Conventional hepatic arterial anatomy. No splenic or hepatic artery aneurysm.  SMA: Arises from the true lumen.  Widely patent.  Renals: Single dominant renal arteries bilaterally. The right renal artery arises from the true lumen and is unremarkable. A left renal artery straddles the dissection flap. The dissection flap continues into the mid aspect of the renal artery but does not appear to  limit arterial flow.  IMA: Ligated at origin.  Inflow: The visualized bilateral proximal common iliac arteries demonstrate minimal atherosclerotic disease and are otherwise widely patent. No aneurysmal dilatation.  Veins: No focal venous abnormality given limitations of non venous phase timing.  NON-VASCULAR  Abdomen: Unremarkable CT appearance of the stomach, duodenum, spleen, adrenal glands and pancreas, and liver. Small stones layer within the gallbladder lumen. No evidence of gallbladder wall thickening or pericholecystic fluid. No intra or extra biliary ductal dilatation.  Unremarkable appearance of the bilateral kidneys. No focal solid lesion, hydronephrosis or nephrolithiasis. Symmetric  bilateral renal parenchymal enhancement. No evidence of bowel obstruction or focal bowel wall thickening. No free fluid or suspicious adenopathy.  Bones/Soft Tissues: No acute fracture or aggressive appearing lytic or blastic osseous lesion. Mild multilevel degenerative disc disease particularly in the lower lumbar spine.  Review of the MIP images confirms the above findings.  IMPRESSION: CHEST CTA  1. Stable postsurgical changes of tube graft ascending aortic repair and hemi aortic arch reconstruction without evidence of new aneurysmal dilatation, dissection or other acute abnormality. 2. Stable residual chronic Stanford type B aneurysmal dissection of the descending thoracic aorta. The maximal diameter is unchanged at 4.5 cm. The 3. Thickening and calcification of re-suspended native aortic valve. 4. Atherosclerosis including multivessel coronary artery disease. 5. Stable combined paraseptal and centrilobular emphysema. ABDOMEN CTA  1. Interval open surgical repair of the previously identified fusiform infrarenal abdominal aortic aneurysm with open tube graft aorto bi-iliac repair. No evidence of postsurgical complication, new aneurysmal dilatation or dissection. 2. Essentially stable abdominal component of the chronic Stanford type B aortic dissection. There has been no further propagation of the dissection flap, or enlargement of the aorta. Incidentally, there has been interval partial thrombosis of the false lumen compared to 10/10/2012. 3. Stable appearance of dissection flap extension into the left main renal artery without evidence of flow limitation or delayed renal parenchymal enhancement. 4. Additional incidental findings as above without interval change. Signed,  Criselda Peaches, MD  [facility]  [facility]   Electronically Signed   By: Jacqulynn Cadet M.D.   On: 09/25/2013 15:56       Assessment & Plan:   Problem List Items Addressed This Visit     Anxiety     Significant symptoms, overlap with PTSD like Reaction to September 2013 spontaneous dissection of thoracic aortic aneurysm and emergent repair followed by may 2014 AAA repair Also exacerbated by and of long term relationship with girlfriend and death of mother Prn xanax but poor sleep as uses only during day began trial of amitriptyline qhs 08/2012 - generally improved trial lorazepam in place of  03/2013 ineffective, back on alpraz. Prescription provided today Also encouraged to followup on psychological or psychiatric care, patient will continue to consider Denies SI/HI    DM2 (diabetes mellitus, type 2) - Primary      Initially dx 02/2012 during hospitalization - a1c 7.2 02/2012 Began OHA x 1 month, then [place] due to hypoglycemia Exacerbated by pred use for allergic rhinitis and poor diet/exercise Discussion med options as diet alone ineffective started metformin xr 500mg  qd 03/2103 and s/p nutrition  eval by Yaakov Guthrie Lab Results  Component Value Date   HGBA1C 7.0* 03/01/2013       Relevant Orders      Hemoglobin A1c      Doppler carotid   HYPERLIPIDEMIA     Check lipids annually Changed vytorin to crestor 2013  titrate as  needed to keep LDL<100    Relevant Orders      Doppler carotid   Left carotid bruit     Reviewed calcifications seen on chiropractor plain xray 03/2012 carotid doppler with B ICAS<40%, but given risks for PVD and bruit, recheck now    Relevant Orders      Doppler carotid    Other Visit Diagnoses   Screening for viral disease        Relevant Orders       Hepatitis C antibody

## 2013-12-12 NOTE — Patient Instructions (Signed)
It was good to see you today.  We have reviewed your prior records including labs and tests today  Zostavax updated today  Test(s) ordered today. Your results will be released to [place] (or called to you) after review, usually within 72hours after test completion. If any changes need to be made, you will be notified at that same time.  Medications reviewed and updated, no changes recommended at this time.  we'll make referral for carotid Doppler. Our office will contact you regarding appointment(s) once made.  Please schedule followup in 6 months, call sooner if problems.

## 2013-12-12 NOTE — Assessment & Plan Note (Signed)
Initially dx 02/2012 during hospitalization - a1c 7.2 02/2012 Began OHA x 1 month, then [place] due to hypoglycemia Exacerbated by pred use for allergic rhinitis and poor diet/exercise Discussion med options as diet alone ineffective started metformin xr 500mg  qd 03/2103 and s/p nutrition  eval by Yaakov Guthrie Lab Results  Component Value Date   HGBA1C 7.0* 03/01/2013

## 2013-12-12 NOTE — Assessment & Plan Note (Signed)
Reviewed calcifications seen on chiropractor plain xray 03/2012 carotid doppler with B ICAS<40%, but given risks for PVD and bruit, recheck now

## 2013-12-12 NOTE — Assessment & Plan Note (Signed)
Significant symptoms, overlap with PTSD like Reaction to September 2013 spontaneous dissection of thoracic aortic aneurysm and emergent repair followed by may 2014 AAA repair Also exacerbated by and of long term relationship with girlfriend and death of mother Prn xanax but poor sleep as uses only during day began trial of amitriptyline qhs 08/2012 - generally improved trial lorazepam in place of  03/2013 ineffective, back on alpraz. Prescription provided today Also encouraged to followup on psychological or psychiatric care, patient will continue to consider Denies SI/HI

## 2013-12-13 LAB — HEPATITIS C ANTIBODY: HCV Ab: NEGATIVE

## 2013-12-27 ENCOUNTER — Other Ambulatory Visit (HOSPITAL_COMMUNITY): Payer: Self-pay | Admitting: Radiology

## 2013-12-27 ENCOUNTER — Ambulatory Visit (HOSPITAL_COMMUNITY): Payer: 59 | Attending: Internal Medicine | Admitting: Radiology

## 2013-12-27 DIAGNOSIS — Z87891 Personal history of nicotine dependence: Secondary | ICD-10-CM | POA: Insufficient documentation

## 2013-12-27 DIAGNOSIS — E785 Hyperlipidemia, unspecified: Secondary | ICD-10-CM | POA: Insufficient documentation

## 2013-12-27 DIAGNOSIS — E119 Type 2 diabetes mellitus without complications: Secondary | ICD-10-CM | POA: Insufficient documentation

## 2013-12-27 DIAGNOSIS — I6529 Occlusion and stenosis of unspecified carotid artery: Secondary | ICD-10-CM | POA: Insufficient documentation

## 2013-12-27 DIAGNOSIS — R0989 Other specified symptoms and signs involving the circulatory and respiratory systems: Secondary | ICD-10-CM | POA: Insufficient documentation

## 2013-12-27 DIAGNOSIS — I10 Essential (primary) hypertension: Secondary | ICD-10-CM | POA: Insufficient documentation

## 2013-12-27 NOTE — Progress Notes (Signed)
Carotid Duplex performed. 

## 2013-12-31 ENCOUNTER — Encounter: Payer: Self-pay | Admitting: Internal Medicine

## 2014-02-06 ENCOUNTER — Ambulatory Visit (HOSPITAL_COMMUNITY)
Admission: RE | Admit: 2014-02-06 | Discharge: 2014-02-06 | Disposition: A | Discharge status: Home / Self Care | Payer: 59 | Source: Ambulatory Visit | Attending: Internal Medicine | Admitting: Internal Medicine

## 2014-02-06 ENCOUNTER — Encounter: Payer: Self-pay | Admitting: Internal Medicine

## 2014-02-06 ENCOUNTER — Ambulatory Visit (INDEPENDENT_AMBULATORY_CARE_PROVIDER_SITE_OTHER): Payer: 59 | Admitting: Internal Medicine

## 2014-02-06 VITALS — BP 142/74 | HR 93 | Temp 96.8°F | Ht 73.0 in | Wt 238.1 lb

## 2014-02-06 DIAGNOSIS — W010XXA Fall on same level from slipping, tripping and stumbling without subsequent striking against object, initial encounter: Secondary | ICD-10-CM | POA: Diagnosis not present

## 2014-02-06 DIAGNOSIS — M549 Dorsalgia, unspecified: Secondary | ICD-10-CM

## 2014-02-06 DIAGNOSIS — W19XXXA Unspecified fall, initial encounter: Secondary | ICD-10-CM | POA: Diagnosis not present

## 2014-02-06 DIAGNOSIS — M545 Low back pain, unspecified: Secondary | ICD-10-CM | POA: Diagnosis present

## 2014-02-06 DIAGNOSIS — M542 Cervicalgia: Secondary | ICD-10-CM | POA: Insufficient documentation

## 2014-02-06 MED ORDER — ACETAMINOPHEN 500 MG PO TABS: 500.0000 mg | ORAL_TABLET | Freq: Four times a day (QID) | ORAL | Status: DC | PRN

## 2014-02-06 NOTE — Progress Notes (Signed)
Patient ID: [name], male   DOB: [birth year], 63 y.o.   MRN: [id number]    Subjective:   Patient ID: [name] male   DOB: Jan 14, 1951 63 y.o.   MRN: [id number]  HPI: [name] is a 63 y.o. pleasant man with past medical history of hypertension, hyperlipidemia, non-insulin dependent Type II DM, COPD and asthma, Type 1 aortic dissection s/p surgery (2013), osteoarthritis, brachial plexopathy of right hand, migraine headaches, anxiety, allergic rhinitis, OSA,  and GERD who presents with chief complaint of recent mechanical fall.   He reports he was taking a shower this morning when he stepped with one foot out of the shower to grab his toothbrush and then slipped and fell backwards on top of a shower seat. He hit the back of his head, his neck, and lower back region. He has not taken anything for the pain or applied ice or heat. He denies headache, vision change, tinnitus, dizziness from baseline, or memory loss. He denies weakness in his arms or legs or paresthesias from baseline in his right arm and both legs. He is able to walk without difficulty. He denies prior history of neck or back problems and reports he was told he has degenerative disc disease.     Past Medical History  Diagnosis Date  . ERECTILE DYSFUNCTION, ORGANIC   . Unspecified vitamin D deficiency   . HYPERLIPIDEMIA   . ASTHMA   . ALLERGIC RHINITIS   . Dissecting aortic aneurysm, thoracic 02/20/2012    s/p emergent surgical repair  . Hyperglycemia   . Anxiety   . OBSTRUCTIVE SLEEP APNEA 02/2008 sleep study    moderate & central- not able to tolerate CPAP   . GERD (gastroesophageal reflux disease)     related to diet & anxiety   . COPD (chronic obstructive pulmonary disease)     also states he has been told that he has slight emphysema   . Arthritis     OA, hands, - thumbs- specifically  . DVT (deep venous thrombosis)     coumadin- x3 months , post op  . Anemia     post op, treated /w Fe  .  Hypertension     pt. followwed by Dr. Johnsie Cancel  . MIGRAINE HEADACHE     induced from physical , emotional stress , but sometimes occur randomly, also experiences BPPV  . Hypogonadism male   . Brachial plexopathy     double crush injury with median nerve damage on the right dominant hand.   . Brachial plexopathy     double crush injury to the median nerve fascicle prox right   . DM2 (diabetes mellitus, type 2)    Current Outpatient Prescriptions  Medication Sig Dispense Refill  . ACCU-CHEK FASTCLIX LANCETS MISC 1 each by Does not apply route daily. Dx code 250.00  102 each  12  . ALPRAZolam (XANAX) 1 MG tablet TAKE 1/2 TABLET BY MOUTH FOUR TIMES DAILY AS NEEDED FOR SLEEP OR ANXIETY  30 tablet  3  . amitriptyline (ELAVIL) 10 MG tablet TAKE 1 TABLET BY MOUTH ONCE DAILY AT BEDTIME  90 tablet  2  . aspirin EC 81 MG tablet Take 81 mg by mouth daily with lunch.      [name] azelastine (ASTELIN) 0.1 % nasal spray PLACE 2 SPRAYS INTO EACH NOSTRIL 2 (TWO) TIMES DAILY.  30 mL  2  . calcium carbonate (TUMS EX) 750 MG chewable tablet Chew 1 tablet by mouth daily.      [name]  cholecalciferol (VITAMIN D) 1000 UNITS tablet Take 2,000 Units by mouth daily.      . CRESTOR 10 MG tablet TAKE 1 TABLET (10 MG TOTAL) BY MOUTH DAILY.  90 tablet  3  . EPINEPHrine (EPI-PEN) 0.3 mg/0.3 mL DEVI Inject 0.3 mg into the muscle as needed (for anaphylaxis).       . fexofenadine (ALLEGRA) 180 MG tablet Take 90 mg by mouth 2 (two) times daily.       . fluticasone (FLONASE) 50 MCG/ACT nasal spray PLACE 2 SPRAYS INTO THE NOSE DAILY.  16 g  3  . glucose blood (ACCU-CHEK SMARTVIEW) test strip Use to check blood sugar as instructed up to 1x per day dx code 250.00  50 each  12  . LORazepam (ATIVAN) 0.5 MG tablet Take 1 tablet (0.5 mg total) by mouth 2 (two) times daily as needed for anxiety.  30 tablet  1  . metFORMIN (GLUCOPHAGE XR) 500 MG 24 hr tablet Take 1 tablet (500 mg total) by mouth daily with breakfast.  90 tablet  3  . metoprolol  tartrate (LOPRESSOR) 25 MG tablet Take 1 tablet (25 mg total) by mouth 2 (two) times daily.  180 tablet  2  . montelukast (SINGULAIR) 10 MG tablet TAKE 1 TABLET BY MOUTH ONCE DAILY  90 tablet  3  . Multiple Vitamin (MULTIVITAMIN WITH MINERALS) TABS Take 1 tablet by mouth daily.      [name] omega-3 acid ethyl esters (LOVAZA) 1 G capsule TAKE 1 CAPSULE BY MOUTH 2 TIMES DAILY.  180 capsule  3  . OVER THE COUNTER MEDICATION Apply 1 application topically daily as needed (for acne). Oxy 10 vanishing cream      . polyethylene glycol powder (GLYCOLAX/MIRALAX) powder TAKE 17G BY MOUTH DAILY.  527 g  5  . predniSONE (DELTASONE) 10 MG tablet Take 2.5 mg by mouth 2 (two) times daily as needed (for shortness of breath and vertigo from allergies).      . rosuvastatin (CRESTOR) 10 MG tablet Take 10 mg by mouth daily after supper.       . thiamine (VITAMIN B-1) 100 MG tablet Take 100 mg by mouth 2 (two) times daily at 10 AM and 5 PM.       . vitamin B-12 (CYANOCOBALAMIN) 500 MCG tablet Take 500-1,000 mcg by mouth daily. Random days      . [DISCONTINUED] oxyCODONE (OXY IR/ROXICODONE) 5 MG immediate release tablet Take 1 tablet (5 mg total) by mouth every 4 (four) hours as needed.  30 tablet  0   No current facility-administered medications for this visit.   Family History  Problem Relation Age of Onset  . Diabetes Mother   . Heart disease Maternal Uncle   . Heart disease Paternal Uncle   . Heart disease Cousin   . Lung cancer Cousin     2 cousins  . Heart disease Maternal Grandmother    History   Social History  . Marital Status: Divorced    Spouse Name: N/A    Number of Children: 2  . Years of Education: acad. deg.   Occupational History  . internal med resident [employer] History Main Topics  . Smoking status: Former Smoker -- 0.10 packs/day    Types: Cigarettes    Quit date: 02/18/2012  . Smokeless tobacco: Never Used     Comment: 10 cigs/day, quit 02/2012 after TAA emergent repair  .  Alcohol Use: Yes     Comment: shot of Tequila  every other day   . Drug Use: No  . Sexual Activity: Not on file   Other Topics Concern  . Not on file   Social History Narrative   Resides alone,consumes a small cup of caffeine daily.   Review of Systems: Review of Systems  Constitutional: Positive for malaise/fatigue (chronic). Negative for fever and chills.  HENT: Positive for congestion (chronic due to AR). Negative for tinnitus.   Respiratory: Negative for cough, shortness of breath and wheezing.   Cardiovascular: Negative for chest pain and leg swelling.  Gastrointestinal: Positive for nausea (since changing fish oil supplements). Negative for vomiting, abdominal pain, diarrhea and constipation.  Genitourinary: Negative for dysuria, urgency and frequency.  Musculoskeletal: Positive for back pain (since fall) and neck pain (since fall ).  Neurological: Positive for dizziness (chronic ), sensory change (chronic in right UE and bilateral LE) and weakness (chronic). Negative for headaches.  Endo/Heme/Allergies: Positive for environmental allergies.  Psychiatric/Behavioral: Negative for memory loss.    Objective:  Physical Exam: Filed Vitals:   02/06/14 1410 02/06/14 1420  BP: 142/74 142/74  Pulse: 93 93  Temp: 96.8 F (36 C) 96.8 F (36 C)  TempSrc: Oral Oral  Height: 6\' 4"  (1.93 m) 6\' 1"  (1.854 m)  Weight: 238 lb 1.6 oz (108.001 kg) 238 lb 1.6 oz (108.001 kg)  SpO2: 96% 96%    Physical Exam  Constitutional: He is oriented to person, place, and time. He appears well-developed and well-nourished. No distress.  HENT:  Head: Normocephalic and atraumatic.  Right Ear: External ear normal.  Left Ear: External ear normal.  Nose: Nose normal.  Mouth/Throat: Oropharynx is clear and moist. No oropharyngeal exudate.  Eyes: Conjunctivae and EOM are normal. Pupils are equal, round, and reactive to light. Right eye exhibits no discharge. Left eye exhibits no discharge. No scleral  icterus.  Neck: Normal range of motion. Neck supple.  Pain with neck movement. Mild cervical paraspinal tenderness.   Cardiovascular: Normal rate and regular rhythm.   Pulmonary/Chest: Effort normal and breath sounds normal. No respiratory distress. He has no wheezes. He has no rales.  Abdominal: Soft. Bowel sounds are normal. He exhibits no distension. There is no tenderness. There is no rebound and no guarding.  Musculoskeletal: Normal range of motion. He exhibits no edema and no tenderness.  Normal flexion and extension of lumbar spine.  Neurological: He is alert and oriented to person, place, and time. No cranial nerve deficit.  Decreased sensation to light touch of right UE (chronic). Normal 5/5 muscle strength throughout.  Skin: Skin is warm and dry. No rash noted. He is not diaphoretic. No erythema. No pallor.  Psychiatric: He has a normal mood and affect. His behavior is normal. Judgment and thought content normal.    Assessment & Plan:   Please see problem list for problem-based assessment and plan

## 2014-02-06 NOTE — Assessment & Plan Note (Addendum)
Assessment: Pt with recent mechanical fall today after slip in shower falling backwards on a shower seat who presents with neck and low back pain without alarm symptoms most likely due to cervical and lumbosacral strain.   Plan:  -Obtain Xray of cervical and lumbar spine ---> degenerative disc disc without fracture -Pt declined CT head w/o contrast at this time due to radiation exposure concern  -Prescribe acetaminophen 500 mg Q 6 hr PRN pain (pt reported he does not like to take pain medications) -Pt instructed to apply ice for 2-3 days and then heat to the area. Pt also to avoid heavy lifting.  -Pt instructed to return if symptoms worsen or do not improve

## 2014-02-06 NOTE — Patient Instructions (Addendum)
-Will get xrays of your neck and low back today -Take tylenol 500 mg every 6 hrs as needed for pain -Apply ice to the area for the first 2-3 days and then heat -If your pain worsens please come back, nice seeing you today!    Lumbosacral Strain Lumbosacral strain is a strain of any of the parts that make up your lumbosacral vertebrae. Your lumbosacral vertebrae are the bones that make up the lower third of your backbone. Your lumbosacral vertebrae are held together by muscles and tough, fibrous tissue (ligaments).  CAUSES  A sudden blow to your back can cause lumbosacral strain. Also, anything that causes an excessive stretch of the muscles in the low back can cause this strain. This is typically seen when people exert themselves strenuously, fall, lift heavy objects, bend, or crouch repeatedly. RISK FACTORS  Physically demanding work.  Participation in pushing or pulling sports or sports that require a sudden twist of the back (tennis, golf, baseball).  Weight lifting.  Excessive lower back curvature.  Forward-tilted pelvis.  Weak back or abdominal muscles or both.  Tight hamstrings. SIGNS AND SYMPTOMS  Lumbosacral strain may cause pain in the area of your injury or pain that moves (radiates) down your leg.  DIAGNOSIS Your health care provider can often diagnose lumbosacral strain through a physical exam. In some cases, you may need tests such as X-ray exams.  TREATMENT  Treatment for your lower back injury depends on many factors that your clinician will have to evaluate. However, most treatment will include the use of anti-inflammatory medicines. HOME CARE INSTRUCTIONS   Avoid hard physical activities (tennis, racquetball, waterskiing) if you are not in proper physical condition for it. This may aggravate or create problems.  If you have a back problem, avoid sports requiring sudden body movements. Swimming and walking are generally safer activities.  Maintain good  posture.  Maintain a healthy weight.  For acute conditions, you may put ice on the injured area.  Put ice in a plastic bag.  Place a towel between your skin and the bag.  Leave the ice on for 20 minutes, 2-3 times a day.  When the low back starts healing, stretching and strengthening exercises may be recommended. SEEK MEDICAL CARE IF:  Your back pain is getting worse.  You experience severe back pain not relieved with medicines. SEEK IMMEDIATE MEDICAL CARE IF:   You have numbness, tingling, weakness, or problems with the use of your arms or legs.  There is a change in bowel or bladder control.  You have increasing pain in any area of the body, including your belly (abdomen).  You notice shortness of breath, dizziness, or feel faint.  You feel sick to your stomach (nauseous), are throwing up (vomiting), or become sweaty.  You notice discoloration of your toes or legs, or your feet get very cold. MAKE SURE YOU:   Understand these instructions.  Will watch your condition.  Will get help right away if you are not doing well or get worse. Document Released: 03/10/2005 Document Revised: 06/05/2013 Document Reviewed: 01/17/2013 [facility] Patient Information 2015 [place], [place]. This information is not intended to replace advice given to you by your health care provider. Make sure you discuss any questions you have with your health care provider.   Cervical Sprain A cervical sprain is an injury in the neck in which the strong, fibrous tissues (ligaments) that connect your neck bones stretch or tear. Cervical sprains can range from mild to severe. Severe cervical  sprains can cause the neck vertebrae to be unstable. This can lead to damage of the spinal cord and can result in serious nervous system problems. The amount of time it takes for a cervical sprain to get better depends on the cause and extent of the injury. Most cervical sprains heal in 1 to 3 weeks. CAUSES  Severe  cervical sprains may be caused by:   Contact sport injuries (such as from football, rugby, wrestling, hockey, auto racing, gymnastics, diving, martial arts, or boxing).   Motor vehicle collisions.   Whiplash injuries. This is an injury from a sudden forward and backward whipping movement of the head and neck.  Falls.  Mild cervical sprains may be caused by:   Being in an awkward position, such as while cradling a telephone between your ear and shoulder.   Sitting in a chair that does not offer proper support.   Working at a poorly Landscape [employer] station.   Looking up or down for long periods of time.  SYMPTOMS   Pain, soreness, stiffness, or a burning sensation in the front, back, or sides of the neck. This discomfort may develop immediately after the injury or slowly, 24 hours or more after the injury.   Pain or tenderness directly in the middle of the back of the neck.   Shoulder or upper back pain.   Limited ability to move the neck.   Headache.   Dizziness.   Weakness, numbness, or tingling in the hands or arms.   Muscle spasms.   Difficulty swallowing or chewing.   Tenderness and swelling of the neck.  DIAGNOSIS  Most of the time your health care provider can diagnose a cervical sprain by taking your history and doing a physical exam. Your health care provider will ask about previous neck injuries and any known neck problems, such as arthritis in the neck. X-rays may be taken to find out if there are any other problems, such as with the bones of the neck. Other tests, such as a CT scan or MRI, may also be needed.  TREATMENT  Treatment depends on the severity of the cervical sprain. Mild sprains can be treated with rest, keeping the neck in place (immobilization), and pain medicines. Severe cervical sprains are immediately immobilized. Further treatment is done to help with pain, muscle spasms, and other symptoms and may include:  Medicines, such  as pain relievers, numbing medicines, or muscle relaxants.   Physical therapy. This may involve stretching exercises, strengthening exercises, and posture training. Exercises and improved posture can help stabilize the neck, strengthen muscles, and help stop symptoms from returning.  HOME CARE INSTRUCTIONS   Put ice on the injured area.   Put ice in a plastic bag.   Place a towel between your skin and the bag.   Leave the ice on for 15-20 minutes, 3-4 times a day.   If your injury was severe, you may have been given a cervical collar to wear. A cervical collar is a two-piece collar designed to keep your neck from moving while it heals.  Do not remove the collar unless instructed by your health care provider.  If you have long hair, keep it outside of the collar.  Ask your health care provider before making any adjustments to your collar. Minor adjustments may be required over time to improve comfort and reduce pressure on your chin or on the back of your head.  Ifyou are allowed to remove the collar for cleaning or bathing, follow  your health care provider's instructions on how to do so safely.  Keep your collar clean by wiping it with mild soap and water and drying it completely. If the collar you have been given includes removable pads, remove them every 1-2 days and hand wash them with soap and water. Allow them to air dry. They should be completely dry before you wear them in the collar.  If you are allowed to remove the collar for cleaning and bathing, wash and dry the skin of your neck. Check your skin for irritation or sores. If you see any, tell your health care provider.  Do not drive while wearing the collar.   Only take over-the-counter or prescription medicines for pain, discomfort, or fever as directed by your health care provider.   Keep all follow-up appointments as directed by your health care provider.   Keep all physical therapy appointments as directed by  your health care provider.   Make any needed adjustments to your workstation to promote good posture.   Avoid positions and activities that make your symptoms worse.   Warm up and stretch before being active to help prevent problems.  SEEK MEDICAL CARE IF:   Your pain is not controlled with medicine.   You are unable to decrease your pain medicine over time as planned.   Your activity level is not improving as expected.  SEEK IMMEDIATE MEDICAL CARE IF:   You develop any bleeding.  You develop stomach upset.  You have signs of an allergic reaction to your medicine.   Your symptoms get worse.   You develop new, unexplained symptoms.   You have numbness, tingling, weakness, or paralysis in any part of your body.  MAKE SURE YOU:   Understand these instructions.  Will watch your condition.  Will get help right away if you are not doing well or get worse. Document Released: 03/28/2007 Document Revised: 06/05/2013 Document Reviewed: 12/06/2012 [facility] Patient Information 2015 [place], [place]. This information is not intended to replace advice given to you by your health care provider. Make sure you discuss any questions you have with your health care provider.    General Instructions:   Please bring your medicines with you each time you come to clinic.  Medicines may include prescription medications, over-the-counter medications, herbal remedies, eye drops, vitamins, or other pills.   Progress Toward Treatment Goals:  No flowsheet data found.  Self Care Goals & Plans:  No flowsheet data found.  No flowsheet data found.   Care Management & Community Referrals:  No flowsheet data found.

## 2014-02-13 NOTE — Progress Notes (Signed)
Case discussed with Dr. Rabbani at time of visit. We reviewed the resident's history and exam and pertinent patient test results. I agree with the assessment, diagnosis, and plan of care documented in the resident's note. 

## 2014-02-28 ENCOUNTER — Other Ambulatory Visit: Payer: Self-pay

## 2014-02-28 MED ORDER — FLUTICASONE PROPIONATE 50 MCG/ACT NA SUSP: NASAL | Status: DC

## 2014-03-29 ENCOUNTER — Other Ambulatory Visit: Payer: Self-pay | Admitting: Vascular Surgery

## 2014-03-29 LAB — BUN: BUN: 18 mg/dL (ref 6–23)

## 2014-03-29 LAB — CREATININE, SERUM: Creat: 0.94 mg/dL (ref 0.50–1.35)

## 2014-04-01 ENCOUNTER — Encounter: Payer: Self-pay | Admitting: Vascular Surgery

## 2014-04-01 ENCOUNTER — Other Ambulatory Visit: Payer: Self-pay | Admitting: *Deleted

## 2014-04-01 DIAGNOSIS — I714 Abdominal aortic aneurysm, without rupture, unspecified: Secondary | ICD-10-CM

## 2014-04-02 ENCOUNTER — Encounter: Payer: Self-pay | Admitting: Vascular Surgery

## 2014-04-02 ENCOUNTER — Ambulatory Visit (INDEPENDENT_AMBULATORY_CARE_PROVIDER_SITE_OTHER): Payer: 59 | Admitting: Vascular Surgery

## 2014-04-02 ENCOUNTER — Ambulatory Visit
Admission: RE | Admit: 2014-04-02 | Discharge: 2014-04-02 | Disposition: A | Discharge status: Home / Self Care | Payer: 59 | Source: Ambulatory Visit | Attending: Vascular Surgery | Admitting: Vascular Surgery

## 2014-04-02 VITALS — BP 141/80 | HR 86 | Ht 73.0 in | Wt 243.2 lb

## 2014-04-02 DIAGNOSIS — Z48812 Encounter for surgical aftercare following surgery on the circulatory system: Secondary | ICD-10-CM

## 2014-04-02 DIAGNOSIS — I714 Abdominal aortic aneurysm, without rupture, unspecified: Secondary | ICD-10-CM

## 2014-04-02 DIAGNOSIS — Z9889 Other specified postprocedural states: Secondary | ICD-10-CM | POA: Insufficient documentation

## 2014-04-02 MED ORDER — IOHEXOL 350 MG/ML SOLN
100.0000 mL | Freq: Once | INTRAVENOUS | Status: AC | PRN
  Administered 2014-04-02: 100 mL via INTRAVENOUS

## 2014-04-02 NOTE — Progress Notes (Signed)
Subjective:     Patient ID: [name], male   DOB: 1950/06/25, 63 y.o.   MRN: [id number]  HPI this 63 year old male returns for continued followup regarding his Type A aortic dissection repair by Dr. Tharon Aquas Trigt and infrarenal abdominal aortic aneurysm repair by me last year. Patient has been followed with CT angiogram. He denies any new symptoms including abdominal or back pain. He does not have claudication symptoms. He denies other neurologic symptoms such as lateralizing weakness, aphasia, amaurosis fugax, diplopia, blurred vision, and syncope. His renal function has been stable.  Past Medical History  Diagnosis Date  . ERECTILE DYSFUNCTION, ORGANIC   . Unspecified vitamin D deficiency   . HYPERLIPIDEMIA   . ASTHMA   . ALLERGIC RHINITIS   . Dissecting aortic aneurysm, thoracic 02/20/2012    s/p emergent surgical repair  . Hyperglycemia   . Anxiety   . OBSTRUCTIVE SLEEP APNEA 02/2008 sleep study    moderate & central- not able to tolerate CPAP   . GERD (gastroesophageal reflux disease)     related to diet & anxiety   . COPD (chronic obstructive pulmonary disease)     also states he has been told that he has slight emphysema   . Arthritis     OA, hands, - thumbs- specifically  . DVT (deep venous thrombosis)     coumadin- x3 months , post op  . Anemia     post op, treated /w Fe  . Hypertension     pt. followwed by Dr. Johnsie Cancel  . MIGRAINE HEADACHE     induced from physical , emotional stress , but sometimes occur randomly, also experiences BPPV  . Hypogonadism male   . Brachial plexopathy     double crush injury with median nerve damage on the right dominant hand.   . Brachial plexopathy     double crush injury to the median nerve fascicle prox right   . DM2 (diabetes mellitus, type 2)   . AAA (abdominal aortic aneurysm)     History  Substance Use Topics  . Smoking status: Former Smoker -- 0.10 packs/day    Types: Cigarettes    Quit date: 02/18/2012  . Smokeless  tobacco: Never Used     Comment: 10 cigs/day, quit 02/2012 after TAA emergent repair  . Alcohol Use: Yes     Comment: shot of Tequila every other day     Family History  Problem Relation Age of Onset  . Diabetes Mother   . Heart disease Maternal Uncle   . Heart disease Paternal Uncle   . Heart disease Cousin   . Lung cancer Cousin     2 cousins  . Heart disease Maternal Grandmother   . Deep vein thrombosis Father   . Varicose Veins Father     Allergies  Allergen Reactions  . Albuterol Other (See Comments)    Panic attack  . Testosterone Shortness Of Breath    Medication:Androgel Pt states trouble breathing at night  . Codeine Nausea And Vomiting  . Gabapentin     "drunk" feeling  . Ultram [Tramadol] Nausea And Vomiting    Current outpatient prescriptions:ACCU-CHEK FASTCLIX LANCETS MISC, 1 each by Does not apply route daily. Dx code 250.00, Disp: 102 each, Rfl: 12;  acetaminophen (TYLENOL) 500 MG tablet, Take 1 tablet (500 mg total) by mouth every 6 (six) hours as needed., Disp: 30 tablet, Rfl: 0;  ALPRAZolam (XANAX) 1 MG tablet, TAKE 1/2 TABLET BY MOUTH FOUR TIMES DAILY AS NEEDED  FOR SLEEP OR ANXIETY, Disp: 30 tablet, Rfl: 3 amitriptyline (ELAVIL) 10 MG tablet, TAKE 1 TABLET BY MOUTH ONCE DAILY AT BEDTIME, Disp: 90 tablet, Rfl: 2;  aspirin EC 81 MG tablet, Take 81 mg by mouth daily with lunch., Disp: , Rfl: ;  azelastine (ASTELIN) 0.1 % nasal spray, PLACE 2 SPRAYS INTO EACH NOSTRIL 2 (TWO) TIMES DAILY., Disp: 30 mL, Rfl: 2;  calcium carbonate (TUMS EX) 750 MG chewable tablet, Chew 1 tablet by mouth daily., Disp: , Rfl:  cholecalciferol (VITAMIN D) 1000 UNITS tablet, Take 2,000 Units by mouth daily., Disp: , Rfl: ;  CRESTOR 10 MG tablet, TAKE 1 TABLET (10 MG TOTAL) BY MOUTH DAILY., Disp: 90 tablet, Rfl: 3;  EPINEPHrine (EPI-PEN) 0.3 mg/0.3 mL DEVI, Inject 0.3 mg into the muscle as needed (for anaphylaxis). , Disp: , Rfl: ;  fexofenadine (ALLEGRA) 180 MG tablet, Take 90 mg by mouth 2  (two) times daily. , Disp: , Rfl:  fluticasone (FLONASE) 50 MCG/ACT nasal spray, PLACE 2 SPRAYS INTO THE NOSE DAILY., Disp: 16 g, Rfl: 3;  glucose blood (ACCU-CHEK SMARTVIEW) test strip, Use to check blood sugar as instructed up to 1x per day dx code 250.00, Disp: 50 each, Rfl: 12;  LORazepam (ATIVAN) 0.5 MG tablet, Take 1 tablet (0.5 mg total) by mouth 2 (two) times daily as needed for anxiety., Disp: 30 tablet, Rfl: 1 metFORMIN (GLUCOPHAGE XR) 500 MG 24 hr tablet, Take 1 tablet (500 mg total) by mouth daily with breakfast., Disp: 90 tablet, Rfl: 3;  metoprolol tartrate (LOPRESSOR) 25 MG tablet, Take 1 tablet (25 mg total) by mouth 2 (two) times daily., Disp: 180 tablet, Rfl: 2;  montelukast (SINGULAIR) 10 MG tablet, TAKE 1 TABLET BY MOUTH ONCE DAILY, Disp: 90 tablet, Rfl: 3 Multiple Vitamin (MULTIVITAMIN WITH MINERALS) TABS, Take 1 tablet by mouth daily., Disp: , Rfl: ;  omega-3 acid ethyl esters (LOVAZA) 1 G capsule, TAKE 1 CAPSULE BY MOUTH 2 TIMES DAILY., Disp: 180 capsule, Rfl: 3;  OVER THE COUNTER MEDICATION, Apply 1 application topically daily as needed (for acne). Oxy 10 vanishing cream, Disp: , Rfl: ;  polyethylene glycol powder (GLYCOLAX/MIRALAX) powder, TAKE 17G BY MOUTH DAILY., Disp: 527 g, Rfl: 5 predniSONE (DELTASONE) 10 MG tablet, Take 2.5 mg by mouth 2 (two) times daily as needed (for shortness of breath and vertigo from allergies)., Disp: , Rfl: ;  rosuvastatin (CRESTOR) 10 MG tablet, Take 10 mg by mouth daily after supper. , Disp: , Rfl: ;  thiamine (VITAMIN B-1) 100 MG tablet, Take 100 mg by mouth 2 (two) times daily at 10 AM and 5 PM. , Disp: , Rfl:  vitamin B-12 (CYANOCOBALAMIN) 500 MCG tablet, Take 500-1,000 mcg by mouth daily. Random days, Disp: , Rfl: ;  [DISCONTINUED] oxyCODONE (OXY IR/ROXICODONE) 5 MG immediate release tablet, Take 1 tablet (5 mg total) by mouth every 4 (four) hours as needed., Disp: 30 tablet, Rfl: 0  BP 141/80  Pulse 86  Ht 6\' 1"  (1.854 m)  Wt 243 lb 3.2 oz  (110.315 kg)  BMI 32.09 kg/m2  SpO2 96%  Body mass index is 32.09 kg/(m^2).           Review of Systems has occasional chest discomfort, dyspnea on exertion, leg pain with walking, asthma secondary to allergies, weakness in arms and legs, occasional dizziness. Also complains of sleep apnea. Other systems negative and a complete review of systems    Objective:   Physical Exam BP 141/80  Pulse 86  Ht 6'  1" (1.854 m)  Wt 243 lb 3.2 oz (110.315 kg)  BMI 32.09 kg/m2  SpO2 96%  Gen.-alert and oriented x3 in no apparent distress HEENT normal for age Lungs no rhonchi or wheezing Cardiovascular regular rhythm no murmurs carotid pulses 3+ palpable no bruits audible Abdomen soft nontender no palpable masses Musculoskeletal free of  major deformities Skin clear -no rashes Neurologic normal Lower extremities 3+ femoral and dorsalis pedis pulses palpable bilaterally with no edema  Today I ordered a CT angiogram of the chest and abdomen which are reviewed by computer. This study looks essentially unchanged from the study done in April of 2015. The true lumen supplies the celiac axis, SMA, and right renal artery as well as the infrarenal aortic graft. The false lumen and continues to fill retrograde up to the distal thoracic aorta. This area is showing no enlargement. It seems to be remaining patent because of some lumbar branches which are being supplied I. the false lumen. There is some dissection in the origin of the left renal artery but it is being well supplied and the length of the left and right kidneys is similar. Good runoff to both legs.        Assessment:     Status post Type A aortic dissection repair an infrarenal abdominal aortic aneurysm repair with persistent false lumen supplying left kidney and intercostal and lumbar branches posteriorly with true lumen supplying celiac axis, SMA, and right renal artery. No evidence of enlargement of thoracic aorta over previous  studies    Plan:     Patient is scheduled to see Dr. Prescott Gum in the next few weeks for further evaluation and followup Return to see me in one year with repeat CT angiogram of chest and abdomen

## 2014-04-03 NOTE — Addendum Note (Signed)
Addended by: Mena Goes on: 04/03/2014 09:37 AM   Modules accepted: Orders

## 2014-04-08 ENCOUNTER — Other Ambulatory Visit: Payer: Self-pay | Admitting: Internal Medicine

## 2014-04-08 MED ORDER — ALPRAZOLAM 1 MG PO TABS: 1.0000 mg | ORAL_TABLET | Freq: Three times a day (TID) | ORAL | Status: DC | PRN

## 2014-04-08 NOTE — Telephone Encounter (Signed)
Sent maintenance meds. Pls advise on alprazolam.../lmb

## 2014-04-08 NOTE — Telephone Encounter (Signed)
Faxed script for xanax back to [place]...[name]

## 2014-04-10 ENCOUNTER — Encounter: Payer: Self-pay | Admitting: Cardiothoracic Surgery

## 2014-04-10 ENCOUNTER — Ambulatory Visit (INDEPENDENT_AMBULATORY_CARE_PROVIDER_SITE_OTHER): Payer: 59 | Admitting: Cardiothoracic Surgery

## 2014-04-10 VITALS — BP 129/74 | HR 64 | Ht 72.0 in | Wt 243.0 lb

## 2014-04-10 DIAGNOSIS — I712 Thoracic aortic aneurysm, without rupture: Secondary | ICD-10-CM

## 2014-04-10 DIAGNOSIS — Z9889 Other specified postprocedural states: Secondary | ICD-10-CM

## 2014-04-10 DIAGNOSIS — I7121 Aneurysm of the ascending aorta, without rupture: Secondary | ICD-10-CM

## 2014-04-10 NOTE — Progress Notes (Signed)
PCP is Gwendolyn Grant, MD Referring Provider is Rowe Clack, MD  Chief Complaint  Patient presents with  . F/U CARDIAC    1 YR F/U VISIT    HPI: 2 year followup after emergency repair of a ascending Stanford type A aortic dissection with resuspension of the aortic valve and hemashield straight graft with hypothermic circulatory arrest.. The patient socially had open repair of a abdominal aortic aneurysm. The patient's descending thoracic aortic false lumen is partially thrombosed. There remains a false lumen in the distal thoracic aorta and proximal abdominal aorta which has not increased in size and remains less than 4.0 cm. He denies any chest pain or back pain. He is stop smoking since his surgery. He has gained weight however  now  240-250 pounds. He has not had hypertension. He is working full-time. His last echocardiogram was 2 years ago following surgery.   Past Medical History  Diagnosis Date  . ERECTILE DYSFUNCTION, ORGANIC   . Unspecified vitamin D deficiency   . HYPERLIPIDEMIA   . ASTHMA   . ALLERGIC RHINITIS   . Dissecting aortic aneurysm, thoracic 02/20/2012    s/p emergent surgical repair  . Hyperglycemia   . Anxiety   . OBSTRUCTIVE SLEEP APNEA 02/2008 sleep study    moderate & central- not able to tolerate CPAP   . GERD (gastroesophageal reflux disease)     related to diet & anxiety   . COPD (chronic obstructive pulmonary disease)     also states he has been told that he has slight emphysema   . Arthritis     OA, hands, - thumbs- specifically  . DVT (deep venous thrombosis)     coumadin- x3 months , post op  . Anemia     post op, treated /w Fe  . Hypertension     pt. followwed by Dr. Johnsie Cancel  . MIGRAINE HEADACHE     induced from physical , emotional stress , but sometimes occur randomly, also experiences BPPV  . Hypogonadism male   . Brachial plexopathy     double crush injury with median nerve damage on the right dominant hand.   . Brachial plexopathy      double crush injury to the median nerve fascicle prox right   . DM2 (diabetes mellitus, type 2)   . AAA (abdominal aortic aneurysm)     Past Surgical History  Procedure Laterality Date  . Nasal fx without repair    . Thoracic aortic aneurysm repair  02/20/2012    Procedure: THORACIC ASCENDING ANEURYSM REPAIR (AAA);  Surgeon: Ivin Poot, MD;  Location: [facility];  Service: Open Heart Surgery;  Laterality: N/A;  . Abdominal aortic aneurysm repair N/A 11/01/2012    Procedure: ANEURYSM ABDOMINAL AORTIC REPAIR;  Surgeon: Mal Misty, MD;  Location: [facility];  Service: Vascular;  Laterality: N/A;  Resection and Grafting of Abdominal Aortic Aneurysm,(AORTA BI ILIAC)  . Foot arthrodesis, triple      Family History  Problem Relation Age of Onset  . Diabetes Mother   . Heart disease Maternal Uncle   . Heart disease Paternal Uncle   . Heart disease Cousin   . Lung cancer Cousin     2 cousins  . Heart disease Maternal Grandmother   . Deep vein thrombosis Father   . Varicose Veins Father     Social History History  Substance Use Topics  . Smoking status: Former Smoker -- 0.10 packs/day    Types: Cigarettes    Quit date:  02/18/2012  . Smokeless tobacco: Never Used     Comment: 10 cigs/day, quit 02/2012 after TAA emergent repair  . Alcohol Use: Yes     Comment: shot of Tequila every other day     Current Outpatient Prescriptions  Medication Sig Dispense Refill  . ACCU-CHEK FASTCLIX LANCETS MISC 1 each by Does not apply route daily. Dx code 250.00  102 each  12  . ALPRAZolam (XANAX) 1 MG tablet Take 1 tablet (1 mg total) by mouth 3 (three) times daily as needed for anxiety.  30 tablet  0  . amitriptyline (ELAVIL) 10 MG tablet TAKE 1 TABLET BY MOUTH ONCE DAILY AT BEDTIME  90 tablet  2  . aspirin EC 81 MG tablet Take 81 mg by mouth daily with lunch.      [name] azelastine (ASTELIN) 0.1 % nasal spray PLACE 2 SPRAYS INTO EACH NOSTRIL 2 (TWO) TIMES DAILY.  30 mL  2  . cholecalciferol (VITAMIN  D) 1000 UNITS tablet Take 2,000 Units by mouth daily.      . CRESTOR 10 MG tablet TAKE 1 TABLET (10 MG TOTAL) BY MOUTH DAILY.  90 tablet  2  . fexofenadine (ALLEGRA) 180 MG tablet Take 90 mg by mouth 2 (two) times daily.       . fluticasone (FLONASE) 50 MCG/ACT nasal spray PLACE 2 SPRAYS INTO THE NOSE DAILY.  16 g  3  . glucose blood (ACCU-CHEK SMARTVIEW) test strip Use to check blood sugar as instructed up to 1x per day dx code 250.00  50 each  12  . metFORMIN (GLUCOPHAGE-XR) 500 MG 24 hr tablet TAKE 1 TABLET (500 MG TOTAL) BY MOUTH DAILY WITH BREAKFAST.  90 tablet  2  . metoprolol tartrate (LOPRESSOR) 25 MG tablet Take 1 tablet (25 mg total) by mouth 2 (two) times daily.  180 tablet  2  . montelukast (SINGULAIR) 10 MG tablet TAKE 1 TABLET BY MOUTH ONCE DAILY  90 tablet  2  . Multiple Vitamin (MULTIVITAMIN WITH MINERALS) TABS Take 1 tablet by mouth daily.      [name] omega-3 acid ethyl esters (LOVAZA) 1 G capsule TAKE 1 CAPSULE BY MOUTH 2 TIMES DAILY.  180 capsule  3  . OVER THE COUNTER MEDICATION Apply 1 application topically daily as needed (for acne). Oxy 10 vanishing cream      . polyethylene glycol powder (GLYCOLAX/MIRALAX) powder TAKE 17G BY MOUTH DAILY.  527 g  5  . predniSONE (DELTASONE) 10 MG tablet Take 2.5 mg by mouth 2 (two) times daily as needed (for shortness of breath and vertigo from allergies).      . thiamine (VITAMIN B-1) 100 MG tablet Take 100 mg by mouth 2 (two) times daily at 10 AM and 5 PM.       . vitamin B-12 (CYANOCOBALAMIN) 500 MCG tablet Take 500-1,000 mcg by mouth daily. Random days      . acetaminophen (TYLENOL) 500 MG tablet Take 1 tablet (500 mg total) by mouth every 6 (six) hours as needed.  30 tablet  0  . calcium carbonate (TUMS EX) 750 MG chewable tablet Chew 1 tablet by mouth daily.      [name] EPINEPHrine (EPI-PEN) 0.3 mg/0.3 mL DEVI Inject 0.3 mg into the muscle as needed (for anaphylaxis).       . [DISCONTINUED] oxyCODONE (OXY IR/ROXICODONE) 5 MG immediate release  tablet Take 1 tablet (5 mg total) by mouth every 4 (four) hours as needed.  30 tablet  0   No  current facility-administered medications for this visit.    Allergies  Allergen Reactions  . Albuterol Other (See Comments)    Panic attack  . Testosterone Shortness Of Breath    Medication:Androgel Pt states trouble breathing at night  . Codeine Nausea And Vomiting  . Gabapentin     "drunk" feeling  . Ultram [Tramadol] Nausea And Vomiting    Review of Systems patient having anxiety attacks and some difficult dreams. He states he feels tired. He states he walks a mile a day. He is able to do yard work and dig holes.  BP 129/74  Pulse 64  Ht 6' (1.829 m)  Wt 243 lb (110.224 kg)  BMI 32.95 kg/m2  SpO2 97% Physical Exam Alert and comfortable, overweight Lungs clear Heart regular rhythm, soft 2/6 systolic flow murmur Abdomen soft without pulsatile mass No pedal edema Peripheral pulses intact  Diagnostic Tests: CT of the thoracic aorta reviewed demonstrating intact repair of the ascending aorta. Transverse arch and descending thoracic aorta are only mildly dilated-4.2 cm  Impression: Doing well 2 years postop emergency repair of ascending dissection. We'll assess aortic valve an LV function with echocardiogram.  Plan: Patient will need CTA of the thoracic aorta one year. He'll continue his current meds and activity levels.

## 2014-04-11 ENCOUNTER — Other Ambulatory Visit: Payer: Self-pay | Admitting: *Deleted

## 2014-04-11 DIAGNOSIS — I359 Nonrheumatic aortic valve disorder, unspecified: Secondary | ICD-10-CM

## 2014-04-15 ENCOUNTER — Ambulatory Visit (HOSPITAL_COMMUNITY): Payer: 59 | Attending: Cardiothoracic Surgery | Admitting: Cardiology

## 2014-04-15 ENCOUNTER — Encounter: Payer: Self-pay | Admitting: Nurse Practitioner

## 2014-04-15 VITALS — BP 132/70 | HR 63

## 2014-04-15 DIAGNOSIS — I10 Essential (primary) hypertension: Secondary | ICD-10-CM | POA: Diagnosis not present

## 2014-04-15 DIAGNOSIS — I359 Nonrheumatic aortic valve disorder, unspecified: Secondary | ICD-10-CM

## 2014-04-15 DIAGNOSIS — E785 Hyperlipidemia, unspecified: Secondary | ICD-10-CM | POA: Insufficient documentation

## 2014-04-15 DIAGNOSIS — E119 Type 2 diabetes mellitus without complications: Secondary | ICD-10-CM | POA: Diagnosis not present

## 2014-04-15 DIAGNOSIS — Z954 Presence of other heart-valve replacement: Secondary | ICD-10-CM | POA: Diagnosis present

## 2014-04-15 NOTE — Progress Notes (Signed)
Echo performed. 

## 2014-04-15 NOTE — Progress Notes (Signed)
Patient here for an echocardiogram today. When echo was about finished, he states he  Was having some intermittant  Sub sternal chest pain, radiating to both jaws. Some dizziness as well. Rates it at 2-3 on a scale of 1-10. He says he has intermittant indigestion. BP 130/80 pulse 64 02 sat 96%. Dr. Liane Comber consulted as DOD. Per her verbal order, EKG was done.  She advised patient be given a Nitro 0.4mg   Sl now. He refused, stating it is now just "indigestion". Dr Meda Coffee notified and came to speak with patient. He told her it was almost completely gone; has had this sensation before when swallowing large pills. No diaphoresis, no N/Vomiting. Informed him his EKG showed NSR. Additional VS on extended vitals. He was encouraged to make a followup appointment with Dr. Johnsie Cancel, but declined saying he wanted to see what echo showed. This encounter will be routed to Dr. Johnsie Cancel also.

## 2014-04-29 NOTE — Progress Notes (Signed)
Spoke with patient today. He is willing to schedule an appointment.

## 2014-04-30 ENCOUNTER — Other Ambulatory Visit: Payer: Self-pay

## 2014-04-30 ENCOUNTER — Encounter: Payer: Self-pay | Admitting: Cardiovascular Disease

## 2014-04-30 ENCOUNTER — Ambulatory Visit (INDEPENDENT_AMBULATORY_CARE_PROVIDER_SITE_OTHER): Payer: 59 | Admitting: Cardiovascular Disease

## 2014-04-30 VITALS — BP 130/80 | HR 89 | Ht 76.0 in | Wt 238.0 lb

## 2014-04-30 DIAGNOSIS — R079 Chest pain, unspecified: Secondary | ICD-10-CM

## 2014-04-30 DIAGNOSIS — I71019 Dissection of thoracic aorta, unspecified: Secondary | ICD-10-CM

## 2014-04-30 DIAGNOSIS — R072 Precordial pain: Secondary | ICD-10-CM

## 2014-04-30 DIAGNOSIS — I714 Abdominal aortic aneurysm, without rupture, unspecified: Secondary | ICD-10-CM

## 2014-04-30 DIAGNOSIS — E119 Type 2 diabetes mellitus without complications: Secondary | ICD-10-CM

## 2014-04-30 DIAGNOSIS — I7101 Dissection of thoracic aorta: Secondary | ICD-10-CM

## 2014-04-30 DIAGNOSIS — I824Y1 Acute embolism and thrombosis of unspecified deep veins of right proximal lower extremity: Secondary | ICD-10-CM

## 2014-04-30 DIAGNOSIS — E785 Hyperlipidemia, unspecified: Secondary | ICD-10-CM

## 2014-04-30 MED ORDER — METOPROLOL TARTRATE 25 MG PO TABS: 25.0000 mg | ORAL_TABLET | Freq: Three times a day (TID) | ORAL | Status: DC

## 2014-04-30 NOTE — Assessment & Plan Note (Signed)
No AR murmur echo with no change in aortic root size F/U PVT  Increase beta blocker to 25 tid  Patient is hesitant to go to 50 bid regarding fatigue and side effects

## 2014-04-30 NOTE — Assessment & Plan Note (Signed)
Has coronary artery calcification on CT  Some relief with nitro no known CAD prior to aortic issues.  F/U lexiscan myovue  ECG unremarkable

## 2014-04-30 NOTE — Patient Instructions (Addendum)
Your physician wants you to follow-up in: [place] will receive a reminder letter in the mail two months in advance. If you don't receive a letter, please call our office to schedule the follow-up appointment. Your physician has recommended you make the following change in your medication: [place] TO   [place]  A DAY   Your physician has requested that you have a lexiscan myoview. For further information please visit [email or web address]. Please follow instruction sheet, as given.

## 2014-04-30 NOTE — Assessment & Plan Note (Signed)
Discussed low carb diet.  Target hemoglobin A1c is 6.5 or less.  Continue current medications.  

## 2014-04-30 NOTE — Progress Notes (Signed)
Patient ID: [name], male   DOB: 1951/01/16, 63 y.o.   MRN: [id number] 62yo with emergent type A disection repair 02/19/12 by PVT. Post op course complicated by DVT on 3 month course of coumadin.Previous smoker but has not restarted since surgery. Surgery 02/19/12 Status post repair of type a ascending dissection with Heaney shield graft used to replace ascending aorta and hemi-arch reconstruction, resuspension of aortic valve Has a residual 6cm infrarenal AAA Since surgery has had paresthesias in 4th and 5th digits on right hand Likely brachial plexus injury with axillary artery cannulation on right side.Still with tingling in fingers and bicipital weakness  There is a family history of aneurysms and with presentation of AAA and thoracic disection I suggested that he should discuss with PVT/Leschber screening his cerebral circulation.  Discussed the resolution of his pericardial effusion and ongoing need to monitor his resuspended AV   Had staged  AAA repair by Dr Kellie Simmering 5/14  F/U venous duplex of LLE today showed resolution of thrombus and no DVT on left  Coumadin stopped   Echo 04/13/12 Normal EF and no AR  Study Conclusions  - Left ventricle: The cavity size was normal. Wall thickness was increased in a pattern of mild LVH. Systolic function was normal. The estimated ejection fraction was in the range of 60% to 65%. Septal bounce possible consistent with prior surgery. Wall motion was normal; there were no regional wall motion abnormalities. - Aortic valve: There was no stenosis. - Aorta: Mildly dilated aortic root. Aortic root dimension:75mm (ED). - Mitral valve: Mildly calcified annulus. Trivial regurgitation. - Left atrium: The atrium was mildly dilated. - Right ventricle: The cavity size was normal. Systolic function was normal. - Right atrium: The atrium was mildly dilated. - Pulmonary arteries: No complete TR doppler jet so unable to estimate PA systolic pressure. -  Inferior vena cava: The vessel was normal in size; the respirophasic diameter changes were in the normal range (= 50%); findings are consistent with normal central venous Pressure.  Reviewed cT done 10/15 and residual distal thoracic dissection stable  Stable appearance of type B thoracic aortic dissection beginning in the distal portion of the descending thoracic aorta and extending into the proximal abdominal aorta. The celiac, superior mesenteric and right renal artery all arise from the true lumen. Dissection flap extends into the proximal portion the left renal artery which is stable compared to prior exam. Symmetric enhancement of both kidneys is noted suggesting no significant impaired flow the either kidney.  Status post surgical repair of infrarenal abdominal aortic aneurysm with no evidence of endoleak.  Spent over 30 minutes discussing his aortic issues.  Patient was scarred by Dr Kellie Simmering about "retrograde flow"  Discussed issues of false lumen and need for surveillance Also discussed benefits of beta blocker to minimize change in residual aortic size    ROS: Denies fever, malais, weight loss, blurry vision, decreased visual acuity, cough, sputum, SOB, hemoptysis, pleuritic pain, palpitaitons, heartburn, abdominal pain, melena, lower extremity edema, claudication, or rash.  All other systems reviewed and negative  General: Affect appropriate Healthy:  appears stated age 62: normal Neck supple with no adenopathy JVP normal no bruits no thyromegaly Lungs clear with no wheezing and good diaphragmatic motion Heart:  S1/S2 SEM no AR murmur, no rub, gallop or click PMI normal Abdomen: benighn, BS positve, no tenderness, post AAA repair  no bruit.  No HSM or HJR Distal pulses intact with no bruits No edema Neuro non-focal Skin warm and dry  No muscular weakness   Current Outpatient Prescriptions  Medication Sig Dispense Refill  . ACCU-CHEK FASTCLIX LANCETS MISC 1  each by Does not apply route daily. Dx code 250.00 102 each 12  . acetaminophen (TYLENOL) 500 MG tablet Take 1 tablet (500 mg total) by mouth every 6 (six) hours as needed. 30 tablet 0  . ALPRAZolam (XANAX) 1 MG tablet Take 1 tablet (1 mg total) by mouth 3 (three) times daily as needed for anxiety. 30 tablet 0  . amitriptyline (ELAVIL) 10 MG tablet TAKE 1 TABLET BY MOUTH ONCE DAILY AT BEDTIME 90 tablet 2  . aspirin EC 81 MG tablet Take 81 mg by mouth daily with lunch.    [name] azelastine (ASTELIN) 0.1 % nasal spray PLACE 2 SPRAYS INTO EACH NOSTRIL 2 (TWO) TIMES DAILY. 30 mL 2  . calcium carbonate (TUMS EX) 750 MG chewable tablet Chew 1 tablet by mouth daily.    . cholecalciferol (VITAMIN D) 1000 UNITS tablet Take 2,000 Units by mouth daily.    . CRESTOR 10 MG tablet TAKE 1 TABLET (10 MG TOTAL) BY MOUTH DAILY. 90 tablet 2  . EPINEPHrine (EPI-PEN) 0.3 mg/0.3 mL DEVI Inject 0.3 mg into the muscle as needed (for anaphylaxis).     . fexofenadine (ALLEGRA) 180 MG tablet Take 90 mg by mouth 2 (two) times daily.     . fluticasone (FLONASE) 50 MCG/ACT nasal spray PLACE 2 SPRAYS INTO THE NOSE DAILY. 16 g 3  . glucose blood (ACCU-CHEK SMARTVIEW) test strip Use to check blood sugar as instructed up to 1x per day dx code 250.00 50 each 12  . metFORMIN (GLUCOPHAGE-XR) 500 MG 24 hr tablet TAKE 1 TABLET (500 MG TOTAL) BY MOUTH DAILY WITH BREAKFAST. 90 tablet 2  . metoprolol tartrate (LOPRESSOR) 25 MG tablet Take 1 tablet (25 mg total) by mouth 2 (two) times daily. 180 tablet 2  . montelukast (SINGULAIR) 10 MG tablet TAKE 1 TABLET BY MOUTH ONCE DAILY 90 tablet 2  . Multiple Vitamin (MULTIVITAMIN WITH MINERALS) TABS Take 1 tablet by mouth daily.    [name] omega-3 acid ethyl esters (LOVAZA) 1 G capsule TAKE 1 CAPSULE BY MOUTH 2 TIMES DAILY. 180 capsule 3  . OVER THE COUNTER MEDICATION Apply 1 application topically daily as needed (for acne). Oxy 10 vanishing cream    . polyethylene glycol powder (GLYCOLAX/MIRALAX) powder  TAKE 17G BY MOUTH DAILY. 527 g 5  . predniSONE (DELTASONE) 10 MG tablet Take 2.5 mg by mouth 2 (two) times daily as needed (for shortness of breath and vertigo from allergies).    . thiamine (VITAMIN B-1) 100 MG tablet Take 100 mg by mouth 2 (two) times daily at 10 AM and 5 PM.     . vitamin B-12 (CYANOCOBALAMIN) 500 MCG tablet Take 500-1,000 mcg by mouth daily. Random days    . [DISCONTINUED] oxyCODONE (OXY IR/ROXICODONE) 5 MG immediate release tablet Take 1 tablet (5 mg total) by mouth every 4 (four) hours as needed. 30 tablet 0   No current facility-administered medications for this visit.    Allergies  Albuterol; Testosterone; Codeine; Gabapentin; and Ultram  Electrocardiogram:  11/17/12 SR rate 68 normal ECG   04/15/14 SR no acute changes compared to June   Assessment and Plan

## 2014-04-30 NOTE — Assessment & Plan Note (Signed)
Stable l repair by recent CT no palpable mass

## 2014-04-30 NOTE — Assessment & Plan Note (Signed)
Resolved now off anticoagulation

## 2014-04-30 NOTE — Assessment & Plan Note (Signed)
Cholesterol is at goal.  Continue current dose of statin and diet Rx.  No myalgias or side effects.  F/U  LFT's in 6 months. Lab Results  Component Value Date   LDLCALC 51 03/01/2013

## 2014-05-01 ENCOUNTER — Other Ambulatory Visit: Payer: Self-pay | Admitting: Internal Medicine

## 2014-05-01 ENCOUNTER — Encounter: Payer: 59 | Admitting: Internal Medicine

## 2014-05-03 ENCOUNTER — Ambulatory Visit (HOSPITAL_COMMUNITY): Payer: 59 | Attending: Cardiovascular Disease | Admitting: Radiology

## 2014-05-03 DIAGNOSIS — I739 Peripheral vascular disease, unspecified: Secondary | ICD-10-CM | POA: Diagnosis not present

## 2014-05-03 DIAGNOSIS — I10 Essential (primary) hypertension: Secondary | ICD-10-CM | POA: Diagnosis not present

## 2014-05-03 DIAGNOSIS — R002 Palpitations: Secondary | ICD-10-CM | POA: Insufficient documentation

## 2014-05-03 DIAGNOSIS — R079 Chest pain, unspecified: Secondary | ICD-10-CM | POA: Diagnosis not present

## 2014-05-03 DIAGNOSIS — R0602 Shortness of breath: Secondary | ICD-10-CM | POA: Diagnosis not present

## 2014-05-03 DIAGNOSIS — E119 Type 2 diabetes mellitus without complications: Secondary | ICD-10-CM | POA: Diagnosis not present

## 2014-05-03 MED ORDER — AMINOPHYLLINE 25 MG/ML IV SOLN: 80.0000 mg | INTRAVENOUS | Status: AC

## 2014-05-03 MED ORDER — TECHNETIUM TC 99M SESTAMIBI GENERIC - CARDIOLITE
30.0000 | Freq: Once | INTRAVENOUS | Status: AC | PRN
  Administered 2014-05-03: 30 via INTRAVENOUS

## 2014-05-03 MED ORDER — REGADENOSON 0.4 MG/5ML IV SOLN
0.4000 mg | Freq: Once | INTRAVENOUS | Status: AC
  Administered 2014-05-03: 0.4 mg via INTRAVENOUS

## 2014-05-03 MED ORDER — TECHNETIUM TC 99M SESTAMIBI GENERIC - CARDIOLITE
10.0000 | Freq: Once | INTRAVENOUS | Status: AC | PRN
  Administered 2014-05-03: 10 via INTRAVENOUS

## 2014-05-03 NOTE — Progress Notes (Signed)
[facility] [address] [place], [place] [address] [phone]    Cardiology Nuclear Med Study  [name] is a 63 y.o. male     MRN : [id number]     DOB: 1951-02-08  Procedure Date: 05/03/2014  Nuclear Med Background Indication for Stress Test:  Evaluation for Ischemia History:  Ct-coronary calcification; 2014 MPI-nml, EF 67% Cardiac Risk Factors: Carotid Disease, Hypertension, NIDDM and PVD  Symptoms:  Chest Pain, Palpitations and SOB   Nuclear Pre-Procedure Caffeine/Decaff Intake:  None NPO After: 10:00pm   Lungs:  clear O2 Sat: 96% on room air. IV 0.9% NS with Angio Cath:  20g  IV Site: L Hand  IV Started by:  Ileene Hutchinson, EMT-P  Chest Size (in):  48 Cup Size: n/a  Height: 6\' 4"  (1.93 m)  Weight:  242 lb (109.77 kg)  BMI:  Body mass index is 29.47 kg/(m^2). Tech Comments:  Held Lopressor, metformin    Nuclear Med Study 1 or 2 day study: 1 day  Stress Test Type:  Carlton Adam  Reading MD: Candee Furbish, MD  Order Authorizing Provider:  P. Johnsie Cancel, MD  Resting Radionuclide: Technetium 58m Sestamibi  Resting Radionuclide Dose: 11.0 mCi   Stress Radionuclide:  Technetium 24m Sestamibi  Stress Radionuclide Dose: 33.0 mCi           Stress Protocol Rest HR: 61 Stress HR: 87  Rest BP: 128/79 Stress BP: 122/55  Exercise Time (min): n/a METS: n/a   Predicted Max HR: 160 bpm % Max HR: 54.38 bpm Rate Pressure Product: 14388   Dose of Adenosine (mg):  n/a Dose of Lexiscan: 0.4 mg  Dose of Atropine (mg): n/a Dose of Dobutamine: n/a mcg/kg/min (at max HR)  Stress Test Technologist: Glade Lloyd, BS-ES  Nuclear Technologist:  Earl Many, CNMT     Rest Procedure:  Myocardial perfusion imaging was performed at rest 45 minutes following the intravenous administration of Technetium 21m Sestamibi. Rest ECG: Sinus rhythm, 61, nonspecific ST-T wave changes.  Stress Procedure:  The patient received IV Lexiscan 0.4 mg over 15-seconds.   Technetium 65m Sestamibi injected at 30-seconds.  Quantitative spect images were obtained after a 45 minute delay.  During the infusion of Lexiscan the patient complained of SOB, flushed, chest tightness, anxiety, dizziness, fatigue and nausea.  Most symptoms subsided except the nausea.  Administered 75 mg aminophylline and the patient began to feel better within a minute.    Stress ECG: No significant change from baseline ECG  QPS Raw Data Images:  Normal; no motion artifact; normal heart/lung ratio. Stress Images:  Normal homogeneous uptake in all areas of the myocardium. Rest Images:  Normal homogeneous uptake in all areas of the myocardium. Subtraction (SDS):  No evidence of ischemia. Transient Ischemic Dilatation (Normal <1.22):  0.94 Lung/Heart Ratio (Normal <0.45):  0.28  Quantitative Gated Spect Images QGS EDV:  112 ml QGS ESV:  44 ml  Impression Exercise Capacity:  Lexiscan with no exercise. BP Response:  Normal blood pressure response. Clinical Symptoms:  Typical symptoms with pharmacologic agent ECG Impression:  No significant ST segment change suggestive of ischemia. Comparison with Prior Nuclear Study: No images to compare  Overall Impression:  Low risk stress nuclear study , No evidence of ischemia.  LV Ejection Fraction: 61%.  LV Wall Motion:  NL LV Function; NL Wall Motion  Candee Furbish, MD

## 2014-05-06 MED ORDER — AMINOPHYLLINE 25 MG/ML IV SOLN
75.0000 mg | Freq: Once | INTRAVENOUS | Status: AC
  Administered 2014-05-03: 75 mg via INTRAVENOUS

## 2014-05-06 NOTE — Addendum Note (Signed)
Addended by: Gardiner Coins on: 05/06/2014 09:21 AM   Modules accepted: Orders

## 2014-05-07 ENCOUNTER — Telehealth: Payer: Self-pay | Admitting: Cardiovascular Disease

## 2014-05-07 NOTE — Telephone Encounter (Signed)
PT AWARE OF MYOVIEW RESULTS./CY 

## 2014-05-07 NOTE — Telephone Encounter (Signed)
Follow up    Patient calling stating someone called him today returning call back .

## 2014-05-07 NOTE — Telephone Encounter (Signed)
New Msg   Patient returning call and would like to be contacted at [phone]

## 2014-05-28 ENCOUNTER — Other Ambulatory Visit: Payer: Self-pay | Admitting: Internal Medicine

## 2014-06-03 ENCOUNTER — Other Ambulatory Visit: Payer: Self-pay | Admitting: Geriatric Medicine

## 2014-06-03 MED ORDER — ALPRAZOLAM 1 MG PO TABS: 0.5000 mg | ORAL_TABLET | Freq: Four times a day (QID) | ORAL | Status: DC | PRN

## 2014-06-03 MED ORDER — POLYETHYLENE GLYCOL 3350 17 GM/SCOOP PO POWD: ORAL | Status: DC

## 2014-07-08 ENCOUNTER — Other Ambulatory Visit: Payer: Self-pay | Admitting: Internal Medicine

## 2014-07-11 ENCOUNTER — Encounter: Payer: 59 | Admitting: Internal Medicine

## 2014-07-12 ENCOUNTER — Other Ambulatory Visit (INDEPENDENT_AMBULATORY_CARE_PROVIDER_SITE_OTHER): Payer: 59

## 2014-07-12 ENCOUNTER — Encounter: Payer: Self-pay | Admitting: Internal Medicine

## 2014-07-12 ENCOUNTER — Ambulatory Visit (INDEPENDENT_AMBULATORY_CARE_PROVIDER_SITE_OTHER): Payer: 59 | Admitting: Internal Medicine

## 2014-07-12 VITALS — BP 152/80 | HR 90 | Temp 97.7°F | Resp 16 | Ht 76.0 in | Wt 242.8 lb

## 2014-07-12 DIAGNOSIS — Z0000 Encounter for general adult medical examination without abnormal findings: Secondary | ICD-10-CM

## 2014-07-12 DIAGNOSIS — R5383 Other fatigue: Secondary | ICD-10-CM

## 2014-07-12 DIAGNOSIS — G43B0 Ophthalmoplegic migraine, not intractable: Secondary | ICD-10-CM

## 2014-07-12 DIAGNOSIS — W19XXXS Unspecified fall, sequela: Secondary | ICD-10-CM

## 2014-07-12 LAB — CBC
HEMATOCRIT: 45.3 % (ref 39.0–52.0)
HEMOGLOBIN: 15.5 g/dL (ref 13.0–17.0)
MCHC: 34.2 g/dL (ref 30.0–36.0)
MCV: 92.6 fl (ref 78.0–100.0)
Platelets: 185 10*3/uL (ref 150.0–400.0)
RBC: 4.89 Mil/uL (ref 4.22–5.81)
RDW: 13.3 % (ref 11.5–15.5)
WBC: 7.2 10*3/uL (ref 4.0–10.5)

## 2014-07-12 LAB — FOLATE: Folate: >24.6 ng/mL (ref >5.9)

## 2014-07-12 LAB — COMPREHENSIVE METABOLIC PANEL
ALT: 46 U/L (ref 0–53)
AST: 29 U/L (ref 0–37)
Albumin: 4.3 g/dL (ref 3.5–5.2)
Alkaline Phosphatase: 30 U/L — ABNORMAL LOW (ref 39–117)
BILIRUBIN TOTAL: 1 mg/dL (ref 0.2–1.2)
BUN: 22 mg/dL (ref 6–23)
CALCIUM: 9.2 mg/dL (ref 8.4–10.5)
CHLORIDE: 102 meq/L (ref 96–112)
CO2: 24 mEq/L (ref 19–32)
Creatinine, Ser: 1.04 mg/dL (ref 0.40–1.50)
GFR: 77.12 mL/min (ref >60.00)
Glucose, Bld: 232 mg/dL — ABNORMAL HIGH (ref 70–99)
Potassium: 4.4 mEq/L (ref 3.5–5.1)
SODIUM: 137 meq/L (ref 135–145)
TOTAL PROTEIN: 7 g/dL (ref 6.0–8.3)

## 2014-07-12 LAB — TSH: TSH: 1.18 u[IU]/mL (ref 0.35–4.50)

## 2014-07-12 LAB — VITAMIN B12: VITAMIN B 12: 913 pg/mL — AB (ref 211–911)

## 2014-07-12 LAB — HEMOGLOBIN A1C: Hgb A1c MFr Bld: 7.3 % — ABNORMAL HIGH (ref 4.6–6.5)

## 2014-07-12 NOTE — Patient Instructions (Signed)
We will check on some blood tests to try to find the cause of your tiredness. I will send a note to Dr. Asa Lente to see what she thinks about whether you need screening for the brain to see if there are aneurysms.   It is okay to stop checking your blood sugars. If you have any symptoms of low blood sugars then check it.   It is okay to take 1/2 amitriptyline and see if you have any headaches. If you are able to stay on the 1/2 dose that is okay to do. If you start having the headaches and visual changes go back to taking the whole amitriptyline.   If we cannot find a cause for the tiredness it may be worthwhile to repeat the sleep study.  For the hair loss, it may be worthwhile to try taking biotin as this is important for hair health.

## 2014-07-12 NOTE — Progress Notes (Signed)
Pre visit review using our clinic review tool, if applicable. No additional management support is needed unless otherwise documented below in the visit note. 

## 2014-07-13 ENCOUNTER — Encounter: Payer: Self-pay | Admitting: Internal Medicine

## 2014-07-13 DIAGNOSIS — Z0000 Encounter for general adult medical examination without abnormal findings: Secondary | ICD-10-CM | POA: Insufficient documentation

## 2014-07-13 NOTE — Progress Notes (Signed)
   Subjective:    Patient ID: [name], male    DOB: 11-20-1950, 64 y.o.   MRN: [id number]  HPI  The patient is a 64 YO man here for wellness. He also wants to know if he can decrease his headache medicine as he is not having many episodes anymore. He has stopped checking his sugars since they were not abnormal.   Diet: DM if diabetic Physical activity: active, chops wood and cares for property Depression/mood screen: negative, mood improved Hearing: intact to whispered voice Visual acuity: grossly normal, performs annual eye exam  ADLs: capable Fall risk: low Home safety: good Cognitive evaluation: intact to orientation, naming, recall and repetition EOL planning: adv directives, full code/ I agree  I have personally reviewed and have noted 1. The patient's medical and social history 2. Their use of alcohol, tobacco or illicit drugs 3. Their current medications and supplements 4. The patient's functional ability including ADL's, fall risks, home safety risks and hearing or visual impairment. 5. Diet and physical activities 6. Evidence for depression or mood disorders 7.  Care team reviewed and updated  Review of Systems  Constitutional: Negative for fever, activity change, appetite change, fatigue and unexpected weight change.  Eyes: Negative.   Respiratory: Negative for cough, chest tightness, shortness of breath and wheezing.   Cardiovascular: Negative for chest pain, palpitations and leg swelling.  Gastrointestinal: Negative.   Genitourinary: Negative.   Musculoskeletal: Positive for arthralgias. Negative for gait problem.  Skin: Negative.   Neurological: Positive for numbness and headaches. Negative for tremors, weakness and light-headedness.  Psychiatric/Behavioral: Positive for dysphoric mood. Negative for sleep disturbance. The patient is nervous/anxious.       Objective:   Physical Exam  Constitutional: He is oriented to person, place, and time. He appears  well-developed and well-nourished.  HENT:  Head: Normocephalic and atraumatic.  Eyes: EOM are normal.  Neck: Normal range of motion.  Cardiovascular: Normal rate.   Murmur heard. Pulmonary/Chest: Effort normal and breath sounds normal. No respiratory distress. He has no wheezes. He has no rales.  Abdominal: Soft. Bowel sounds are normal. He exhibits no distension. There is no tenderness.  Musculoskeletal: He exhibits no edema.  Neurological: He is alert and oriented to person, place, and time. Coordination normal.  numbness in the 4-5th right fingers, part of the arm, legs in the foot area.   Skin: Skin is warm and dry.  Psychiatric: He has a normal mood and affect. His behavior is normal.   Filed Vitals:   07/12/14 1024 07/12/14 1103  BP: 170/100 152/80  Pulse: 90   Temp: 97.7 F (36.5 C)   TempSrc: Oral   Resp: 16   Height: 6\' 4"  (1.93 m)   Weight: 242 lb 12.8 oz (110.133 kg)   SpO2: 92%       Assessment & Plan:

## 2014-07-13 NOTE — Assessment & Plan Note (Addendum)
Given that he has had dissection and repair in the abdomen and chest suggestion by cardiology that he have cranial screening for aneurysm. Unclear if this is wise and will forward to PCP for consideration. He does have migraines which he has had since his 40s-50s. Has had CT several years ago and cannot get MRI due to hardware with his AAA repair. Checking bloodwork today for tiredness including HgA1c, TSH, B12, folate, CBC, CMP. Increased BP today is alarming and will have close follow up. If still elevated needs aggressive titration. Last at cardiology was at goal. They are increased his beta blocker currently and doing well on 75 mg metoprolol daily (goal is 50 mg bid). HR still around 90 and room to increase beta blocker. Has had flu shot, pneumonia, tetanus. Is overdue for colonoscopy and he did not want to schedule that today.

## 2014-07-13 NOTE — Assessment & Plan Note (Signed)
Can try 1/2 elavil at night time and if more headaches he will go back to 1 pill at night time. If doing well on 1/2 pill at night time he can try going off.

## 2014-07-13 NOTE — Assessment & Plan Note (Signed)
Has not fallen since fall and is very careful, home without trip hazards.

## 2014-07-17 ENCOUNTER — Ambulatory Visit (INDEPENDENT_AMBULATORY_CARE_PROVIDER_SITE_OTHER): Payer: 59 | Admitting: Cardiothoracic Surgery

## 2014-07-17 ENCOUNTER — Encounter: Payer: Self-pay | Admitting: Cardiothoracic Surgery

## 2014-07-17 VITALS — BP 129/77 | HR 74 | Resp 20 | Ht 76.0 in | Wt 242.0 lb

## 2014-07-17 DIAGNOSIS — Z9889 Other specified postprocedural states: Secondary | ICD-10-CM

## 2014-07-17 DIAGNOSIS — I712 Thoracic aortic aneurysm, without rupture: Secondary | ICD-10-CM

## 2014-07-17 DIAGNOSIS — I7121 Aneurysm of the ascending aorta, without rupture: Secondary | ICD-10-CM

## 2014-07-17 NOTE — Progress Notes (Signed)
PCP is Gwendolyn Grant, MD Referring Provider is Rowe Clack, MD  Chief Complaint  Patient presents with  . Follow-up    3 month f/u, last 2D ECHO 04/15/14    [id number] returns for 3 month followup status post repair of Stanford type A ascending dissection with straight graft and resuspension of aortic valve September 2013.the false lumen of the distal aorta remains patent but is not enlarged and last diameter of the descending thoracic aorta by CTA last summer was 4.0 cm. after repair of his acute dissection the patient underwent a repair of a AAA May 2014 by Dr. Kellie Simmering. The patient had a echocardiogram 3 months ago which showed normal LV function, normal aortic valve function without AS or AI.  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 He has stopped smoking. His blood pressure is well-controlled.  Today he complains of a persistent numbness and some weakness in his right hand right upper extremity. He also complains of "neurocognitive"problems with abnormal thoughts which combine dreams and hallucinations with reality.he has difficulty with short  term memory and short-term reading comprehension. He had one episode of transient blurred vision after working in the yard recently. Recently he had bilateral carotid Dopplers which showed no significant stenosis. He is in a sinus rhythm. He takes aspirin daily.   Past Medical History  Diagnosis Date  . ERECTILE DYSFUNCTION, ORGANIC   . Unspecified vitamin D deficiency   . HYPERLIPIDEMIA   . ASTHMA   . ALLERGIC RHINITIS   . Dissecting aortic aneurysm, thoracic 02/20/2012    s/p emergent surgical repair  . Hyperglycemia   . Anxiety   . OBSTRUCTIVE SLEEP APNEA 02/2008 sleep study    moderate & central- not able to tolerate CPAP   . GERD (gastroesophageal reflux disease)     related to diet & anxiety   . COPD (chronic obstructive pulmonary disease)     also states he has  been told that he has slight emphysema   . Arthritis     OA, hands, - thumbs- specifically  . DVT (deep venous thrombosis)     coumadin- x3 months , post op  . Anemia     post op, treated /w Fe  . Hypertension     pt. followwed by Dr. Johnsie Cancel  . MIGRAINE HEADACHE     induced from physical , emotional stress , but sometimes occur randomly, also experiences BPPV  . Hypogonadism male   . Brachial plexopathy     double crush injury with median nerve damage on the right dominant hand.   . Brachial plexopathy     double crush injury to the median nerve fascicle prox right   . DM2 (diabetes mellitus, type 2)   . AAA (abdominal aortic aneurysm)     Past Surgical History  Procedure Laterality Date  . Nasal fx without repair    . Thoracic aortic aneurysm repair  02/20/2012    Procedure: THORACIC ASCENDING ANEURYSM REPAIR (AAA);  Surgeon: Ivin Poot, MD;  Location: [facility];  Service: Open Heart Surgery;  Laterality: N/A;  . Abdominal aortic aneurysm repair N/A 11/01/2012    Procedure: ANEURYSM ABDOMINAL AORTIC REPAIR;  Surgeon: Mal Misty, MD;  Location: [facility];  Service: Vascular;  Laterality: N/A;  Resection and Grafting of Abdominal Aortic Aneurysm,(AORTA BI ILIAC)  . Foot arthrodesis, triple      Family History  Problem Relation Age of Onset  . Diabetes  Mother   . Heart disease Maternal Uncle   . Heart disease Paternal Uncle   . Heart disease Cousin   . Lung cancer Cousin     2 cousins  . Heart disease Maternal Grandmother   . Deep vein thrombosis Father   . Varicose Veins Father     Social History History  Substance Use Topics  . Smoking status: Former Smoker -- 0.10 packs/day    Types: Cigarettes    Quit date: 02/18/2012  . Smokeless tobacco: Never Used     Comment: 10 cigs/day, quit 02/2012 after TAA emergent repair  . Alcohol Use: Yes     Comment: shot of Tequila every other day     Current Outpatient Prescriptions  Medication Sig Dispense Refill  . ACCU-CHEK  FASTCLIX LANCETS MISC 1 each by Does not apply route daily. Dx code 250.00 102 each 12  . ALPRAZolam (XANAX) 1 MG tablet Take 0.5 tablets (0.5 mg total) by mouth 4 (four) times daily as needed for anxiety or sleep. 60 tablet 5  . amitriptyline (ELAVIL) 10 MG tablet TAKE 1 TABLET BY MOUTH ONCE DAILY AT BEDTIME 90 tablet 2  . aspirin EC 81 MG tablet Take 81 mg by mouth daily with lunch.    [name] azelastine (ASTELIN) 0.1 % nasal spray PLACE 2 SPRAYS INTO EACH NOSTRIL 2 (TWO) TIMES DAILY. 30 mL 2  . calcium carbonate (TUMS EX) 750 MG chewable tablet Chew 1 tablet by mouth daily.    . cholecalciferol (VITAMIN D) 1000 UNITS tablet Take 2,000 Units by mouth daily.    . CRESTOR 10 MG tablet TAKE 1 TABLET (10 MG TOTAL) BY MOUTH DAILY. 90 tablet 2  . EPINEPHrine (EPI-PEN) 0.3 mg/0.3 mL DEVI Inject 0.3 mg into the muscle as needed (for anaphylaxis).     . fexofenadine (ALLEGRA) 180 MG tablet Take 90 mg by mouth 2 (two) times daily.     . fluticasone (FLONASE) 50 MCG/ACT nasal spray INSTILL 2 SPRAYS INT0 EACH NOSTRIL ONCE DAILY 16 g 3  . glucose blood (ACCU-CHEK SMARTVIEW) test strip Use to check blood sugar as instructed up to 1x per day dx code 250.00 50 each 12  . metFORMIN (GLUCOPHAGE-XR) 500 MG 24 hr tablet TAKE 1 TABLET (500 MG TOTAL) BY MOUTH DAILY WITH BREAKFAST. 90 tablet 2  . metoprolol tartrate (LOPRESSOR) 25 MG tablet Take 1 tablet (25 mg total) by mouth 3 (three) times daily. 270 tablet 2  . montelukast (SINGULAIR) 10 MG tablet TAKE 1 TABLET BY MOUTH ONCE DAILY 90 tablet 2  . Multiple Vitamin (MULTIVITAMIN WITH MINERALS) TABS Take 1 tablet by mouth daily.    [name] omega-3 acid ethyl esters (LOVAZA) 1 G capsule TAKE 1 CAPSULE BY MOUTH 2 TIMES DAILY. 180 capsule 3  . OVER THE COUNTER MEDICATION Apply 1 application topically daily as needed (for acne). Oxy 10 vanishing cream    . polyethylene glycol powder (GLYCOLAX/MIRALAX) powder TAKE 17G BY MOUTH DAILY. 527 g 5  . predniSONE (DELTASONE) 10 MG tablet Take  2.5 mg by mouth 2 (two) times daily as needed (for shortness of breath and vertigo from allergies).    . thiamine (VITAMIN B-1) 100 MG tablet Take 100 mg by mouth 2 (two) times daily at 10 AM and 5 PM.     . vitamin B-12 (CYANOCOBALAMIN) 500 MCG tablet Take 500-1,000 mcg by mouth daily. Random days    . [DISCONTINUED] oxyCODONE (OXY IR/ROXICODONE) 5 MG immediate release tablet Take 1 tablet (5 mg total) by  mouth every 4 (four) hours as needed. 30 tablet 0   No current facility-administered medications for this visit.    Allergies  Allergen Reactions  . Albuterol Other (See Comments)    Panic attack  . Testosterone Shortness Of Breath    Medication:Androgel Pt states trouble breathing at night  . Codeine Nausea And Vomiting  . Gabapentin     "drunk" feeling  . Ultram [Tramadol] Nausea And Vomiting    Review of Systems    General:   Normal appetite, no weight loss, no fever or night sweats Cardiac:    -  Chest pain with exertion,  - chest pain at rest,   -SOB with exertion,                   - PND,  - orthopnea, -  palpitations or arrhythmias,    - history atrial fibrillation                 - dizzy spells-presyncope, - Syncope, -and is a the base of LE edema Respiratory:  No Shortness of breath,  no home oxygen, no productive cough,  No sleep apnea, no CPAP at night, no hemoptysis, no COPD GI:           No difficulty swallowing, no reflux, no hiatal hernia-heartburn, no chronic                          abdominal pain, no hematochezia, no hematemesis, no melena GU:        No dysuria, no frequency, no UTI recently, no hematuria, no kidney stones,               No BPH Vascular: No pain suggestive of claudication, no varicose veins, no DVT, no nonhealing                Foot ulcer, no rest pain suggestive of ischemia Neuro:   No stroke, no TIAs, no seizures, no neuropathy, no gait instability, no                             memory/cognitive dysfunction                Musculoskeletal:  No  arthritis, no joint swelling, no difficulty walking, no decreased  Mobility  + weakness or RUE                     Skin:     No rash, no ulcerations or pressure sores Psych:    + anxiety, no depression, Eyes:    No change in vision, no amaurosis, no eye surgery ENT:    No hearing loss, no loose or painful teeth, no dentures, no recent dental                          procedure Hematologic:  No easy bruising, no bleeding disorder, no frequent epistaxes Endocrine:  No diabetes,   checks CBG at home    BP 129/77 mmHg  Pulse 74  Resp 20  Ht 6\' 4"  (1.93 m)  Wt 242 lb (109.77 kg)  BMI 29.47 kg/m2  SpO2 95% Physical Exam  General: anxious overweight Caucasian male no acute distress HEENT: Normocephalic pupils equal , dentition adequate Neck: Supple without JVD, adenopathy, or bruit Chest: Clear to auscultation, symmetrical breath sounds, no rhonchi, no tenderness  or deformity Cardiovascular: Regular rate and rhythm, 2/6 systolic ejection murmur murmur, no gallop, peripheral pulses             palpable in all extremities Abdomen:  Soft, nontender, no palpable mass or organomegaly Extremities: Warm, well-perfused, no clubbing cyanosis edema or tenderness,              no venous stasis changes of the legs Rectal/GU: Deferred Neuro: Grossly non--focal except for slight decreased right hand grip strength Skin: Clean and dry without rash or ulceration   Diagnostic Tests: None done today  Most recent echocardiogram and most recent CTA of the thoracic aorta personally  reviewed by myself  Impression: Patient doing well 2 1/2  years after emergency repair of a Stanford type A ascending thoracic dissection with resuspension of aortic valve. His blood pressure is well controlled.[name] He is  not smoking.his transient visual symptoms are concerning and he is recommended to see his ophthalmologist and we will refer him back to his neurologist Dr. Maureen Chatters because of persistent and increasing  neurocognitive symptoms.  Plan:continue current medications and level of activity. Refer to orthopedics for upper extremity weakness and numbness. Refer to neurology regarding neurocognitive symptoms and transient blurred vision Return later this year after followup CTA of the thoracic aorta in October.

## 2014-07-18 NOTE — Telephone Encounter (Signed)
Error msg

## 2014-07-22 ENCOUNTER — Ambulatory Visit: Payer: 59 | Admitting: Thoracic Surgery (Cardiothoracic Vascular Surgery)

## 2014-07-29 ENCOUNTER — Ambulatory Visit: Payer: 59 | Admitting: Sports Medicine

## 2014-08-20 ENCOUNTER — Encounter: Payer: Self-pay | Admitting: Sports Medicine

## 2014-08-20 ENCOUNTER — Ambulatory Visit (INDEPENDENT_AMBULATORY_CARE_PROVIDER_SITE_OTHER): Payer: 59 | Admitting: Sports Medicine

## 2014-08-20 VITALS — BP 148/74 | Ht 76.0 in | Wt 242.0 lb

## 2014-08-20 DIAGNOSIS — M25511 Pain in right shoulder: Secondary | ICD-10-CM

## 2014-08-20 NOTE — Patient Instructions (Signed)
Your symptoms today are most consistent with a minor right-sided labral tear, which is a type of degenerative tear.  1. Your options would be to rest the shoulder and monitor improvement or a steroid injection, which you preferred not to do today.    2. Continue with your usual acitviites, but try to avoid motions that cause you pain and may add strai to the area.  3. Follow-up with me if you notice your symptoms are worsening.

## 2014-08-20 NOTE — Progress Notes (Signed)
Subjective:     Patient ID: [name], male   DOB: Nov 25, 1950, 64 y.o.   MRN: [id number]  HPI Mr. Denise Bramblett is a 64y.o. Male here today for a 30-month history of right shoulder pain. Pt reports that he first noted the pain while shoveling mulch 3 months ago, it which time he stopped and noted it improved. He describes the pain as a sharp, shooting pain along the anterior surface of his shoulder and towards his mid-arm, and was exacerbated by specific movements. He reports that the symptoms returned when he attempted to shovel snow and then later mulch, at which time he became concerned. He denies hearing a pop or catching sensation, but notes that pain is now present when hh attempts to lift his arms above his head or while driving a car. This pain does not wake him up at night. He reports that these symptoms differ from the pain he has associated with his median and radial nerve injuries, but reports he does feel weaker and continues to have hand pain and numbness and general arm atrophy because of this.   Review of Systems     Objective:   Physical Exam Filed Vitals:   08/20/14 1141  BP: 148/74  Height: 6\' 4"  (1.93 m)  Weight: 242 lb (109.77 kg)   well-developed, well-nourished. No acute distress. Vital signs reviewed  Right shoulder: Full range of motion. No tenderness to palpation over the bicipital groove nor over the ac joint. Negative empty can, negative [place]. Rotator cuff strength is 5/5 and does not reproduce pain. Negative O'Brien's, negative clunk. No obvious Popeye deformity.    Assessment:     Right shoulder pain likely secondary to degenerative labral tear    Plan:     Patient's exam is fairly unremarkable today. However his history suggests a possible labral tear. Patient states that over the past 3 months his symptoms have been improving especially when he stays away from activities that aggravate it such as shoveling. I discussed an intra-articular cortisone  injection but the patient would like to wait on that for now. I do not think the patient has suffered a major rotator cuff injury. Our plan at this time is to take a watchful waiting approach. He understands that we can reconsider an injection later date if we need to. We could also consider imaging initially in the form of an ultrasound but ultimately possibly needing an MRI to rule out a significant labral tear if symptoms persist or worsen. He is instructed to avoid those activities that cause him discomfort. Otherwise I think he can increase activity as tolerated and he will follow-up with me for ongoing or recalcitrant issues.  Patient was seen with the medical student today. I agree with the above plan of care.

## 2014-08-26 ENCOUNTER — Ambulatory Visit (INDEPENDENT_AMBULATORY_CARE_PROVIDER_SITE_OTHER): Payer: 59 | Admitting: Neurology

## 2014-08-26 ENCOUNTER — Encounter: Payer: Self-pay | Admitting: Neurology

## 2014-08-26 VITALS — BP 125/70 | HR 68 | Resp 18 | Ht 74.75 in | Wt 241.6 lb

## 2014-08-26 DIAGNOSIS — G3184 Mild cognitive impairment, so stated: Secondary | ICD-10-CM

## 2014-08-26 DIAGNOSIS — R413 Other amnesia: Secondary | ICD-10-CM | POA: Diagnosis not present

## 2014-08-26 DIAGNOSIS — H8112 Benign paroxysmal vertigo, left ear: Secondary | ICD-10-CM

## 2014-08-26 DIAGNOSIS — H8110 Benign paroxysmal vertigo, unspecified ear: Secondary | ICD-10-CM | POA: Insufficient documentation

## 2014-08-26 MED ORDER — ALPRAZOLAM 1 MG PO TABS: 0.5000 mg | ORAL_TABLET | Freq: Four times a day (QID) | ORAL | Status: DC | PRN

## 2014-08-26 MED ORDER — PREGABALIN 50 MG PO CAPS: 50.0000 mg | ORAL_CAPSULE | Freq: Three times a day (TID) | ORAL | Status: DC

## 2014-08-26 MED ORDER — DONEPEZIL HCL 5 MG PO TABS: 5.0000 mg | ORAL_TABLET | Freq: Every day | ORAL | Status: DC

## 2014-08-26 NOTE — Progress Notes (Signed)
[facility]  Provider:  Larey Seat, M D  Referring Provider: Rowe Clack, MD Primary Care Physician:  Gwendolyn Grant, MD  Chief Complaint  Patient presents with  . NP Neurocognitive problems [name]    Rm 11, alone    HPI:  [name] is a 64 y.o. male  Is seen here as a referral by Dr Nils Pyle after reporting confusional episodes.  Formerly seen  from Dr. Asa Lente for Brachial neuropathy, 08-26-14 Periods are episodes during which he feels confused, slightly disoriented to  Time frame, he feels that he has trouble differentiating if he is dreaming, if he  is awake, is in a memory . He reports also that his behaviors have changed but he cannot really explain those. #1 his leaving cabinet doors open which is not his habit usually. He misplaces things frequently and gets panicky when not able to locate them. Also there is a difficulty with abstraction. He mentioned that he hurt somebody referred to a saying ; "I've been there,  done that and I brought the T-shirt " he could not understand what the "bought the T-shirt "part was about. He also has noticed that he sometimes gets off the elevator in the hospital and is not sure if he has to go left or right- it takes him a while to reorient himself. He also mentioned an event when he tried to and was preparing to mow his lawn, he had to fill the lawnmower with gasoline and held the petroleum can cap  In  one hand -he forgot what it was used for- until he realized that this was cap that closed the petroleum can. He has also trouble with recognize him some word sometimes in mid sentence sometimes word that he is just reading sometimes words he wants to use and speak. He is less comfortbale with change and new situations, any divergence form his routine.   Amnestic episodes. Family history positive for a mother with  dementia in her 31 's. He spoke to his sister, and she has some symptoms , too. He is  weaning off amitriptyline.     Last visit note 8- 2014 CD.  [name] is a 63 year old Caucasian gentleman, who  underwent surgery for an aortic dissection in September 2013. Following the surgery he had first some numbness and weakness involving the right upper extremity. The patient had experienced shortly at the improvement since that time when he reported that he still had some residual numbness of the dominant hand as well as some noticeable weakness no she had no discomfort associated no pain. I referred the patient for I nerve conduction study with EMG which was performed on 12-20-12 by Dr. Floyde Parkins. The patient's blood conduction studies and EMGs confirm a proximal right median  neuropathy of the brachial plexus, located lately at the ligament of Struthers. The changes were already chronic and moderate to severe. A cervical radiculopathy and impingement of the exiting nerves was not noted it is likely that the patient suffered a so-called double crush syndrome doing has had surgery. It is important to note that the ulnar and radial nerve is of the right extremity are unaffected.  [name]  reports some tingling and he extends his right upper extremity which is a new phenomenon. It is slow not troubling him very much but it is noticeable. The area of dysesthesia affects the skin dermatome. It extends from the medial biceps through the antebrachial region into fingers of  his right hand. He had questions about using amitriptyline to treat the tingling numbness. It will  also help with sleep initiation and amitriptyline is used in patients with neck pain as well as neuropathy. His neuropathy seems also to be related to the surgery and possibly the anesthesia perhaps the loss of blood flow.  The patient reports that his sudden loss of awareness spells that he had suffered around the time of the surgery has not occurred at least for the last four to six weeks. I have asked him to try to avoid  decongestants. An EEG performed on 06-1612 was normal.  Review of Systems: Out of a complete 14 system review, the patient complains of only the following symptoms, and all other reviewed systems are negative. See above .  History   Social History  . Marital Status: Divorced    Spouse Name: N/A  . Number of Children: 2  . Years of Education: acad. deg.   Occupational History  . internal med resident [employer] History Main Topics  . Smoking status: Former Smoker -- 0.10 packs/day    Types: Cigarettes    Quit date: 02/18/2012  . Smokeless tobacco: Never Used     Comment: 10 cigs/day, quit 02/2012 after TAA emergent repair  . Alcohol Use: 0.0 oz/week    0 Standard drinks or equivalent per week     Comment: shot of Tequila every day   . Drug Use: No  . Sexual Activity: Not on file   Other Topics Concern  . Not on file   Social History Narrative   Resides alone,consumes a small cup of caffeine daily.    Family History  Problem Relation Age of Onset  . Diabetes Mother   . Heart disease Maternal Uncle   . Heart disease Paternal Uncle   . Heart disease Cousin   . Lung cancer Cousin     2 cousins  . Heart disease Maternal Grandmother   . Deep vein thrombosis Father   . Varicose Veins Father   . Stroke Mother   . Dementia Mother     Past Medical History  Diagnosis Date  . ERECTILE DYSFUNCTION, ORGANIC   . Unspecified vitamin D deficiency   . HYPERLIPIDEMIA   . ASTHMA   . ALLERGIC RHINITIS   . Dissecting aortic aneurysm, thoracic 02/20/2012    s/p emergent surgical repair  . Hyperglycemia   . Anxiety   . OBSTRUCTIVE SLEEP APNEA 02/2008 sleep study    moderate & central- not able to tolerate CPAP   . GERD (gastroesophageal reflux disease)     related to diet & anxiety   . COPD (chronic obstructive pulmonary disease)     also states he has been told that he has slight emphysema   . Arthritis     OA, hands, - thumbs- specifically  . DVT (deep venous  thrombosis)     coumadin- x3 months , post op  . Anemia     post op, treated /w Fe  . Hypertension     pt. followwed by Dr. Johnsie Cancel  . MIGRAINE HEADACHE     induced from physical , emotional stress , but sometimes occur randomly, also experiences BPPV  . Hypogonadism male   . Brachial plexopathy     double crush injury with median nerve damage on the right dominant hand.   . Brachial plexopathy     double crush injury to the median nerve fascicle prox right   . DM2 (  diabetes mellitus, type 2)   . AAA (abdominal aortic aneurysm)     Past Surgical History  Procedure Laterality Date  . Nasal fx without repair    . Thoracic aortic aneurysm repair  02/20/2012    Procedure: THORACIC ASCENDING ANEURYSM REPAIR (AAA);  Surgeon: Ivin Poot, MD;  Location: [facility];  Service: Open Heart Surgery;  Laterality: N/A;  . Abdominal aortic aneurysm repair N/A 11/01/2012    Procedure: ANEURYSM ABDOMINAL AORTIC REPAIR;  Surgeon: Mal Misty, MD;  Location: [facility];  Service: Vascular;  Laterality: N/A;  Resection and Grafting of Abdominal Aortic Aneurysm,(AORTA BI ILIAC)  . Foot arthrodesis, triple      Current Outpatient Prescriptions  Medication Sig Dispense Refill  . ACCU-CHEK FASTCLIX LANCETS MISC 1 each by Does not apply route daily. Dx code 250.00 102 each 12  . ALPRAZolam (XANAX) 1 MG tablet Take 0.5 tablets (0.5 mg total) by mouth 4 (four) times daily as needed for anxiety or sleep. 60 tablet 5  . amitriptyline (ELAVIL) 10 MG tablet TAKE 1 TABLET BY MOUTH ONCE DAILY AT BEDTIME 90 tablet 2  . aspirin EC 81 MG tablet Take 81 mg by mouth daily with lunch.    [name] azelastine (ASTELIN) 0.1 % nasal spray PLACE 2 SPRAYS INTO EACH NOSTRIL 2 (TWO) TIMES DAILY. 30 mL 2  . calcium carbonate (TUMS EX) 750 MG chewable tablet Chew 1 tablet by mouth daily.    . cholecalciferol (VITAMIN D) 1000 UNITS tablet Take 2,000 Units by mouth daily.    . CRESTOR 10 MG tablet TAKE 1 TABLET (10 MG TOTAL) BY MOUTH DAILY. 90  tablet 2  . EPINEPHrine (EPI-PEN) 0.3 mg/0.3 mL DEVI Inject 0.3 mg into the muscle as needed (for anaphylaxis).     . fexofenadine (ALLEGRA) 180 MG tablet Take 90 mg by mouth 2 (two) times daily.     . fluticasone (FLONASE) 50 MCG/ACT nasal spray INSTILL 2 SPRAYS INT0 EACH NOSTRIL ONCE DAILY 16 g 3  . glucose blood (ACCU-CHEK SMARTVIEW) test strip Use to check blood sugar as instructed up to 1x per day dx code 250.00 50 each 12  . metFORMIN (GLUCOPHAGE-XR) 500 MG 24 hr tablet TAKE 1 TABLET (500 MG TOTAL) BY MOUTH DAILY WITH BREAKFAST. 90 tablet 2  . metoprolol tartrate (LOPRESSOR) 25 MG tablet Take 1 tablet (25 mg total) by mouth 3 (three) times daily. 270 tablet 2  . montelukast (SINGULAIR) 10 MG tablet TAKE 1 TABLET BY MOUTH ONCE DAILY 90 tablet 2  . Multiple Vitamin (MULTIVITAMIN WITH MINERALS) TABS Take 1 tablet by mouth daily.    [name] omega-3 acid ethyl esters (LOVAZA) 1 G capsule TAKE 1 CAPSULE BY MOUTH 2 TIMES DAILY. 180 capsule 3  . OVER THE COUNTER MEDICATION Apply 1 application topically daily as needed (for acne). Oxy 10 vanishing cream    . polyethylene glycol powder (GLYCOLAX/MIRALAX) powder TAKE 17G BY MOUTH DAILY. 527 g 5  . predniSONE (DELTASONE) 10 MG tablet Take 2.5 mg by mouth 2 (two) times daily as needed (for shortness of breath and vertigo from allergies).    . thiamine (VITAMIN B-1) 100 MG tablet Take 100 mg by mouth 2 (two) times daily at 10 AM and 5 PM.     . vitamin B-12 (CYANOCOBALAMIN) 500 MCG tablet Take 500-1,000 mcg by mouth daily. Random days    . [DISCONTINUED] oxyCODONE (OXY IR/ROXICODONE) 5 MG immediate release tablet Take 1 tablet (5 mg total) by mouth every 4 (four) hours as  needed. 30 tablet 0   No current facility-administered medications for this visit.    Allergies as of 08/26/2014 - Review Complete 08/26/2014  Allergen Reaction Noted  . Albuterol Other (See Comments) 05/23/2012  . Testosterone Shortness Of Breath 02/16/2012  . Codeine Nausea And Vomiting  10/24/2012  . Gabapentin  03/29/2013  . Ultram [tramadol] Nausea And Vomiting 11/16/2012    Vitals: BP 125/70 mmHg  Pulse 68  Resp 18  Ht 6' 2.75" (1.899 m)  Wt 241 lb 9.6 oz (109.589 kg)  BMI 30.39 kg/m2 Last Weight:  Wt Readings from Last 1 Encounters:  08/26/14 241 lb 9.6 oz (109.589 kg)   Last Height:   Ht Readings from Last 1 Encounters:  08/26/14 6' 2.75" (1.899 m)    February  2016 Dr. Prescott Gum Note  ; HY:[id number] returns for 3 month followup status post repair of Stanford type A ascending dissection with straight graft and resuspension of aortic valve September 2013.the false lumen of the distal aorta remains patent but is not enlarged and last diameter of the descending thoracic aorta by CTA last summer was 4.0 cm. after repair of his acute dissection the patient underwent a repair of a AAA May 2014 by Dr. Kellie Simmering.The patient had a echocardiogram 3 months ago which showed normal LV function, normal aortic valve function without AS or AI. 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 He has stopped smoking. His blood pressure is well-controlled. Today he complains of a persistent numbness and some weakness in his right hand right upper extremity. He also complains of "neurocognitive"problems with abnormal thoughts which combine dreams and hallucinations with reality.he has difficulty with short  term memory and short-term reading comprehension. He had one episode of transient blurred vision after working in the yard recently. Recently he had bilateral carotid Dopplers which showed no significant stenosis. He is in a sinus rhythm. He takes aspirin daily.   General: The patient is awake, alert and appears not in acute distress. The patient is well groomed. Head: Normocephalic, atraumatic. Neck is supple. Mallampati 3 , neck circumference: 18 Cardiovascular:  Regular rate and rhythm , without  murmurs or carotid bruit, and  without distended neck veins. Respiratory: Lungs are clear to auscultation. Skin:  Without evidence of edema, or rash Trunk: BMI is  elevated and patient  has normal posture.   Neurologic exam : The patient is awake and alert, oriented to place and time.  Memory subjective  described as disturbed - There is a normal attention span & concentration ability.  Speech is fluent without dysarthria, dysphonia , but he reports subjective  aphasia. Mood and affect are appropriate.  Cranial nerves: Pupils are equal and briskly reactive to light. Funduscopic exam without  evidence of pallor or edema.  Extraocular movements in vertical and horizontal planes intact and without nystagmus. Visual fields by finger perimetry are intact. Hearing to finger rub intact.   Facial sensation intact to fine touch. Facial motor strength is symmetric and tongue and uvula move midline.  Motor exam:  Normal tone and normal muscle bulk. The patient has a weaker grip strength on the right than left and a week of pinch strength as well as the thenar atrophy. Since his brachial plexus injury his right arm has become smaller than his left and his right shoulder is slightly droopier than his left. He does have facial symmetry however. Sensory:  Fine touch, pinprick and vibration were tested  in all extremities. Proprioception is normal in the upper extremeties, the feet and toes lost fine touch and vibration. He lost temperature sensation .  Coordination: Rapid alternating movements in the fingers/hands is tested and normal. Finger-to-nose maneuver tested and normal without evidence of ataxia, dysmetria or tremor. He had initial dysmetria on the left, but not when repeated.    Gait and station: Patient walks without assistive device and is able and assisted stool climb up to the exam table. Strength within normal limits.  Stance is stable and normal. Tandem gait is fragmented, he has overcome Vestibulitis - took vestibular  rehab, he turns with 4 Steps, which are unfragmented. Romberg testing is negative  Deep tendon reflexes: in the upper and lower extremities are symmetric and intact. Babinski maneuver response is downgoing.   Assessment:  After physical and neurologic examination, review of laboratory studies, imaging, neurophysiology testing and pre-existing records, assessment :  Mr. [name] has well recovered from his brachial plexus injury as documented on his right arm in the past, he had bouts of ataxia and risk of falling before his vertigo was found to be was related to vestibular inflammation during allergy season. He just confirmed with me that he went through vestibular rehabilitation which has improved his balance. Here on testing there is still a mild swaying noted and these tandem gait was impaired.  The patient's amnestic spells slight confusional spells were further worked up and his medication was reviewed I agree that amitriptyline may be causing some of these concentration and multitasking issues however its take it only 10 mg at night and and now on an just a half of 10 mg at night. I would like for the patient to wean off if he can tolerated it does help her sleep however. It also helped with his migraines. He has Xanax for sleep which I will allow him to continue.  Cognitive impairment was not found on test. The patient underwent a Montral cognitive assessment test is 29 out of 30 points. He was able to do a trail making test he was able to name and repeat he recalled 4 out of 5 recall words his visual-spatial orientation is intact as is his attention. The patient maintained 82 F words., passed his  fluency test and was able to draw a clock face.  Plan: Treatment plan and additional workup will be reviewed under Problem List.  Brief trance like stage , confusion,  Dizziness in episodic fashion, word recall delay.   EEG,  Continue XANAX,  neuropsychogical battery referrall.  D/c  Elavil Continue Fexofenadine for vertigo.- singulair for DOB. Has a spell once a month- not frequent enough to capture on EEG or EKG.  Start Gabapentin- for neuropathy and spells. Migraine.

## 2014-10-18 ENCOUNTER — Telehealth: Payer: Self-pay | Admitting: Cardiovascular Disease

## 2014-10-18 NOTE — Telephone Encounter (Signed)
New Message  Upon making an appt from recalls for this pt. He asked if 07/18 was the soonest appt with Dr. Johnsie Cancel. I explained the recall process. Pt mumbled " because I have been having heart problem". Upon completion of the scheduled appointment. I asked if the pt would like to send a message to the nurse. He declined with a "No, thank you, I'll just come to the appt"   Sending message to document pt's chart.

## 2014-10-21 NOTE — Telephone Encounter (Signed)
UNABLE TO LEAVE  MESSAGE  WILL TRY LATER .Adonis Housekeeper

## 2014-10-25 NOTE — Telephone Encounter (Signed)
PT'S  PHONE  IS NOT  ACCEPTING  PHONE CALLS AT [facility]

## 2014-10-30 NOTE — Telephone Encounter (Signed)
LMTCB ./CY 

## 2014-10-31 NOTE — Telephone Encounter (Signed)
SPOKE WITH PT RE MESSAGE  PER PT  HAS  BEEN  EXPERIENCING  SPASM LIKE  FEELING ( VIBRATION) TO  L  NIPPLE  AREA  LASTING  A  FEW  SECONDS  AND  THEN DISAPPEARS  IS  OCCURING  ON A  DAILY  BASIS   FREQUENTLY   NO  OTHER  SYMPTOMS HAS APPT  NOT  UNTIL  July  18  PT  DID NOT  IF  SHOULD  WAIT THAT  LONG  WILL FORWARD TO  DR Johnsie Cancel FOR  REVIEW .Adonis Housekeeper

## 2014-10-31 NOTE — Telephone Encounter (Signed)
Does not sound like a heart issue ok to wait

## 2014-10-31 NOTE — Telephone Encounter (Signed)
LEFT VOICE MAIL FOR PT  EPISODE  DOES  NOT SOUND HEART RELATED  AND  OKAY TO WAIT   TO BE  SEEN UNTIL  July APPT

## 2014-11-01 ENCOUNTER — Other Ambulatory Visit: Payer: Self-pay | Admitting: Internal Medicine

## 2014-11-21 NOTE — Telephone Encounter (Signed)
Error

## 2014-11-26 LAB — HM DIABETES EYE EXAM

## 2014-11-29 ENCOUNTER — Other Ambulatory Visit: Payer: Self-pay | Admitting: Internal Medicine

## 2014-12-12 ENCOUNTER — Encounter: Payer: Self-pay | Admitting: Internal Medicine

## 2014-12-27 NOTE — Progress Notes (Signed)
Patient ID: [name], male   DOB: Mar 22, 1951, 64 y.o.   MRN: [id number] 64 y.o. with emergent type A disection repair 02/19/12 by PVT. Post op course complicated by DVT on 3 month course of coumadin.Previous smoker but has not restarted since surgery. Surgery 02/19/12 Status post repair of type A ascending dissection with Heaney shield graft used to replace ascending aorta and hemi-arch reconstruction, resuspension of aortic valve Had a residual 6cm infrarenal AAA Since surgery has  paresthesias in 4th and 5th digits on right hand Likely brachial plexus injury with axillary artery cannulation on right side.Still with tingling in fingers and bicipital weakness   Discussed the resolution of his pericardial effusion and ongoing need to monitor his resuspended AV   Had staged  AAA repair by Dr Kellie Simmering 5/14  F/U venous duplex of LLE today showed resolution of thrombus and no DVT on left  Coumadin stopped   Echo 04/13/12 Normal EF and no AR  Study Conclusions  - Left ventricle: The cavity size was normal. Wall thickness was increased in a pattern of mild LVH. Systolic function was normal. The estimated ejection fraction was in the range of 60% to 65%. Septal bounce possible consistent with prior surgery. Wall motion was normal; there were no regional wall motion abnormalities. - Aortic valve: There was no stenosis. - Aorta: Mildly dilated aortic root. Aortic root dimension:71mm (ED). - Mitral valve: Mildly calcified annulus. Trivial regurgitation. - Left atrium: The atrium was mildly dilated. - Right ventricle: The cavity size was normal. Systolic function was normal. - Right atrium: The atrium was mildly dilated. - Pulmonary arteries: No complete TR doppler jet so unable to estimate PA systolic pressure. - Inferior vena cava: The vessel was normal in size; the respirophasic diameter changes were in the normal range (= 50%); findings are consistent with normal central  venous Pressure.  Reviewed CT done 10/15 and residual distal thoracic dissection stable  Stable appearance of type B thoracic aortic dissection beginning in the distal portion of the descending thoracic aorta and extending into the proximal abdominal aorta. The celiac, superior mesenteric and right renal artery all arise from the true lumen. Dissection flap extends into the proximal portion the left renal artery which is stable compared to prior exam. Symmetric enhancement of both kidneys is noted suggesting no significant impaired flow the either kidney.  Status post surgical repair of infrarenal abdominal aortic aneurysm with no evidence of endoleak.  Spent over 30 minutes discussing his aortic issues.  Patient was scarred by Dr Kellie Simmering about "retrograde flow"  Discussed issues of false lumen and need for surveillance Also discussed benefits of beta blocker to minimize change in residual aortic size    05/06/14  Myovue reviewed Normal no ischemia or infarct 04/15/14 Echo reviewed Normal EF 60-65% mild biatrial enlargement no valve disease   ROS: Denies fever, malais, weight loss, blurry vision, decreased visual acuity, cough, sputum, SOB, hemoptysis, pleuritic pain, palpitaitons, heartburn, abdominal pain, melena, lower extremity edema, claudication, or rash.  All other systems reviewed and negative  General: Affect appropriate Healthy:  appears stated age 43: normal Neck supple with no adenopathy JVP normal no bruits no thyromegaly Lungs clear with no wheezing and good diaphragmatic motion Heart:  S1/S2 SEM no AR murmur, no rub, gallop or click PMI normal Abdomen: benighn, BS positve, no tenderness, post AAA repair  no bruit.  No HSM or HJR Distal pulses intact with no bruits No edema Neuro non-focal Skin warm and dry No muscular weakness  Current Outpatient Prescriptions  Medication Sig Dispense Refill  . ACCU-CHEK FASTCLIX LANCETS MISC 1 each by Does not apply  route daily. Dx code 250.00 102 each 12  . ALPRAZolam (XANAX) 1 MG tablet Take 0.5 tablets (0.5 mg total) by mouth 4 (four) times daily as needed for anxiety or sleep. 60 tablet 5  . aspirin EC 81 MG tablet Take 81 mg by mouth daily with lunch.    [name] azelastine (ASTELIN) 0.1 % nasal spray Place 2 sprays into both nostrils 2 (two) times daily. 30 mL 2  . calcium carbonate (TUMS EX) 750 MG chewable tablet Chew 1 tablet by mouth daily as needed for heartburn.     . cholecalciferol (VITAMIN D) 1000 UNITS tablet Take 2,000 Units by mouth daily.    . CRESTOR 10 MG tablet TAKE 1 TABLET (10 MG TOTAL) BY MOUTH DAILY. 90 tablet 2  . EPIPEN 2-PAK 0.3 MG/0.3ML SOAJ injection Inject 0.3 mLs (0.3 mg total) into the muscle once. 2 Device 3  . fexofenadine (ALLEGRA) 180 MG tablet Take 90 mg by mouth 2 (two) times daily.     . fluticasone (FLONASE) 50 MCG/ACT nasal spray Place 2 sprays into both nostrils daily. 16 g 3  . glucose blood (ACCU-CHEK SMARTVIEW) test strip Use to check blood sugar as instructed up to 1x per day dx code 250.00 50 each 12  . metFORMIN (GLUCOPHAGE-XR) 500 MG 24 hr tablet TAKE 1 TABLET (500 MG TOTAL) BY MOUTH DAILY WITH BREAKFAST. 90 tablet 2  . metoprolol tartrate (LOPRESSOR) 25 MG tablet Take 1 tablet (25 mg total) by mouth 3 (three) times daily. 270 tablet 2  . montelukast (SINGULAIR) 10 MG tablet Take 5 mg by mouth 2 (two) times daily.    . Multiple Vitamin (MULTIVITAMIN WITH MINERALS) TABS Take 1 tablet by mouth daily.    [name] omega-3 acid ethyl esters (LOVAZA) 1 G capsule Take 1 capsule (1 g total) by mouth 2 (two) times daily. 180 capsule 3  . OVER THE COUNTER MEDICATION Apply 1 application topically daily as needed (for acne). Oxy 10 vanishing cream    . polyethylene glycol powder (GLYCOLAX/MIRALAX) powder TAKE 17G BY MOUTH DAILY. 527 g 5  . predniSONE (DELTASONE) 10 MG tablet Take 2.5 mg by mouth 2 (two) times daily as needed (for shortness of breath and vertigo from allergies).    .  thiamine (VITAMIN B-1) 100 MG tablet Take 100 mg by mouth 2 (two) times daily at 10 AM and 5 PM.     . vitamin B-12 (CYANOCOBALAMIN) 500 MCG tablet Take 500-1,000 mcg by mouth daily.     . [DISCONTINUED] oxyCODONE (OXY IR/ROXICODONE) 5 MG immediate release tablet Take 1 tablet (5 mg total) by mouth every 4 (four) hours as needed. 30 tablet 0   No current facility-administered medications for this visit.    Allergies  Testosterone; Codeine; Gabapentin; Ultram; and Albuterol  Electrocardiogram:  11/17/12 SR rate 68 normal ECG   04/15/14 SR no acute changes compared to June   Assessment and Plan Dissection:  Grafted aortic root  F/u echo in a year AV resuspended no change in SEM no AR murmur AAA:  F/U CT per Dr Kellie Simmering for false lumen Chest Pain Resolved normal myovue 2015 DM Discussed low carb diet.  Target hemoglobin A1c is 6.5 or less.  Continue current medications. Chol:    Cholesterol is at goal.  Continue current dose of statin and diet Rx.  No myalgias or side effects.  F/U  LFT's in 6 months. Lab Results  Component Value Date   LDLCALC 51 03/01/2013   DVT:   Resolved by Duplex off anticoagulaiton related to vascular surgery  Bruit:  Duplex 12/2013 plaque no stenosis f/u 12/2015   Cognitive:  Some issues with memory did not want to start aricept.  F/u neuro

## 2014-12-30 ENCOUNTER — Ambulatory Visit (INDEPENDENT_AMBULATORY_CARE_PROVIDER_SITE_OTHER): Payer: 59 | Admitting: Cardiovascular Disease

## 2014-12-30 ENCOUNTER — Encounter: Payer: Self-pay | Admitting: Cardiovascular Disease

## 2014-12-30 VITALS — BP 106/70 | HR 63 | Ht 76.0 in | Wt 241.8 lb

## 2014-12-30 DIAGNOSIS — I7100 Dissection of unspecified site of aorta: Secondary | ICD-10-CM | POA: Diagnosis not present

## 2014-12-30 NOTE — Patient Instructions (Addendum)
Medication Instructions:  No changes   Labwork: none  Testing/Procedures:  Your physician has requested that you have a carotid duplex. This test is an ultrasound of the carotid arteries in your neck. It looks at blood flow through these arteries that supply the brain with blood. Allow one hour for this exam. There are no restrictions or special instructions. DUE  July  2017  Follow-Up: Your physician wants you to follow-up in:  [place] will receive a reminder letter in the mail two months in advance. If you don't receive a letter, please call our office to schedule the follow-up appointment.   Any Other Special Instructions Will Be Listed Below (If Applicable).

## 2015-01-02 ENCOUNTER — Other Ambulatory Visit: Payer: Self-pay | Admitting: Internal Medicine

## 2015-01-13 ENCOUNTER — Other Ambulatory Visit (INDEPENDENT_AMBULATORY_CARE_PROVIDER_SITE_OTHER): Payer: 59

## 2015-01-13 ENCOUNTER — Ambulatory Visit (INDEPENDENT_AMBULATORY_CARE_PROVIDER_SITE_OTHER): Payer: 59 | Admitting: Internal Medicine

## 2015-01-13 ENCOUNTER — Encounter: Payer: Self-pay | Admitting: Internal Medicine

## 2015-01-13 VITALS — BP 122/64 | HR 72 | Temp 97.6°F | Ht 76.0 in | Wt 239.8 lb

## 2015-01-13 DIAGNOSIS — E785 Hyperlipidemia, unspecified: Secondary | ICD-10-CM

## 2015-01-13 DIAGNOSIS — G629 Polyneuropathy, unspecified: Secondary | ICD-10-CM | POA: Diagnosis not present

## 2015-01-13 DIAGNOSIS — Z0000 Encounter for general adult medical examination without abnormal findings: Secondary | ICD-10-CM | POA: Diagnosis not present

## 2015-01-13 DIAGNOSIS — E119 Type 2 diabetes mellitus without complications: Secondary | ICD-10-CM

## 2015-01-13 LAB — HEMOGLOBIN A1C: Hgb A1c MFr Bld: 6.8 % — ABNORMAL HIGH (ref 4.6–6.5)

## 2015-01-13 LAB — MICROALBUMIN / CREATININE URINE RATIO
CREATININE, U: 262.1 mg/dL
Microalb Creat Ratio: 0.6 mg/g (ref 0.0–30.0)
Microalb, Ur: 1.5 mg/dL (ref 0.0–1.9)

## 2015-01-13 LAB — LIPID PANEL
CHOLESTEROL: 126 mg/dL (ref 0–200)
HDL: 36.2 mg/dL — AB (ref >39.00)
LDL Cholesterol: 56 mg/dL (ref 0–99)
NonHDL: 89.69
Total CHOL/HDL Ratio: 3
Triglycerides: 166 mg/dL — ABNORMAL HIGH (ref 0.0–149.0)
VLDL: 33.2 mg/dL (ref 0.0–40.0)

## 2015-01-13 LAB — BASIC METABOLIC PANEL
BUN: 21 mg/dL (ref 6–23)
CALCIUM: 9.4 mg/dL (ref 8.4–10.5)
CO2: 21 mEq/L (ref 19–32)
CREATININE: 0.99 mg/dL (ref 0.40–1.50)
Chloride: 108 mEq/L (ref 96–112)
GFR: 81.5 mL/min (ref >60.00)
GLUCOSE: 164 mg/dL — AB (ref 70–99)
Potassium: 4.5 mEq/L (ref 3.5–5.1)
Sodium: 140 mEq/L (ref 135–145)

## 2015-01-13 MED ORDER — METFORMIN HCL ER 500 MG PO TB24: 500.0000 mg | ORAL_TABLET | Freq: Every day | ORAL | Status: DC

## 2015-01-13 NOTE — Assessment & Plan Note (Signed)
Initially dx 02/2012 during hospitalization - a1c 7.2 02/2012 Began OHA x 1 month, then [place] due to hypoglycemia Exacerbated by pred use for allergic rhinitis and poor diet/exercise started metformin xr 500mg  qd 03/2013 and s/p nutrition eval by Yaakov Guthrie Lab Results  Component Value Date   HGBA1C 7.3* 07/12/2014

## 2015-01-13 NOTE — Assessment & Plan Note (Signed)
Neuropathic pain, exacerbated by anxiety and history of injury to brachial plexus during axillary dissection for cardiopulmonary bypass support during intraoperative repair of aortic dissection Concerned amitriptyline exacerbating memory impairment so stopped same Considering potential side effects so reluctant to try Lyrica or gabapentin for management of paresthesia Encouraged to continue following with neurology as ongoing

## 2015-01-13 NOTE — Patient Instructions (Addendum)
It was good to see you today.  We have reviewed your prior records including labs and tests today  Health Maintenance reviewed - will arrange for Cologaurd for colon cancer screening - all other recommended immunizations and age-appropriate screenings are up-to-date.  Test(s) ordered today. Your results will be released to [place] (or called to you) after review, usually within 72hours after test completion. If any changes need to be made, you will be notified at that same time.  Medications reviewed and updated, no changes recommended at this time. Ok to try OTC Benadryl or Unisom for sleep if needed  Please schedule followup in 6 months for diabetes exam and labs, call sooner if problems.  Health Maintenance A healthy lifestyle and preventative care can promote health and wellness.  Maintain regular health, dental, and eye exams.  Eat a healthy diet. Foods like vegetables, fruits, whole grains, low-fat dairy products, and lean protein foods contain the nutrients you need and are low in calories. Decrease your intake of foods high in solid fats, added sugars, and salt. Get information about a proper diet from your health care provider, if necessary.  Regular physical exercise is one of the most important things you can do for your health. Most adults should get at least 150 minutes of moderate-intensity exercise (any activity that increases your heart rate and causes you to sweat) each week. In addition, most adults need muscle-strengthening exercises on 2 or more days a week.   Maintain a healthy weight. The body mass index (BMI) is a screening tool to identify possible weight problems. It provides an estimate of body fat based on height and weight. Your health care provider can find your BMI and can help you achieve or maintain a healthy weight. For males 20 years and older:  A BMI below 18.5 is considered underweight.  A BMI of 18.5 to 24.9 is normal.  A BMI of 25 to 29.9 is  considered overweight.  A BMI of 30 and above is considered obese.  Maintain normal blood lipids and cholesterol by exercising and minimizing your intake of saturated fat. Eat a balanced diet with plenty of fruits and vegetables. Blood tests for lipids and cholesterol should begin at age 67 and be repeated every 5 years. If your lipid or cholesterol levels are high, you are over age 57, or you are at high risk for heart disease, you may need your cholesterol levels checked more frequently.Ongoing high lipid and cholesterol levels should be treated with medicines if diet and exercise are not working.  If you smoke, find out from your health care provider how to quit. If you do not use tobacco, do not start.  Lung cancer screening is recommended for adults aged 25-80 years who are at high risk for developing lung cancer because of a history of smoking. A yearly low-dose CT scan of the lungs is recommended for people who have at least a 30-pack-year history of smoking and are current smokers or have quit within the past 15 years. A pack year of smoking is smoking an average of 1 pack of cigarettes a day for 1 year (for example, a 30-pack-year history of smoking could mean smoking 1 pack a day for 30 years or 2 packs a day for 15 years). Yearly screening should continue until the smoker has stopped smoking for at least 15 years. Yearly screening should be stopped for people who develop a health problem that would prevent them from having lung cancer treatment.  If you  choose to drink alcohol, do not have more than 2 drinks per day. One drink is considered to be 12 oz (360 mL) of beer, 5 oz (150 mL) of wine, or 1.5 oz (45 mL) of liquor.  Avoid the use of street drugs. Do not share needles with anyone. Ask for help if you need support or instructions about stopping the use of drugs.  High blood pressure causes heart disease and increases the risk of stroke. Blood pressure should be checked at least every  1-2 years. Ongoing high blood pressure should be treated with medicines if weight loss and exercise are not effective.  If you are 36-68 years old, ask your health care provider if you should take aspirin to prevent heart disease.  Diabetes screening involves taking a blood sample to check your fasting blood sugar level. This should be done once every 3 years after age 62 if you are at a normal weight and without risk factors for diabetes. Testing should be considered at a younger age or be carried out more frequently if you are overweight and have at least 1 risk factor for diabetes.  Colorectal cancer can be detected and often prevented. Most routine colorectal cancer screening begins at the age of 51 and continues through age 68. However, your health care provider may recommend screening at an earlier age if you have risk factors for colon cancer. On a yearly basis, your health care provider may provide home test kits to check for hidden blood in the stool. A small camera at the end of a tube may be used to directly examine the colon (sigmoidoscopy or colonoscopy) to detect the earliest forms of colorectal cancer. Talk to your health care provider about this at age 60 when routine screening begins. A direct exam of the colon should be repeated every 5-10 years through age 102, unless early forms of precancerous polyps or small growths are found.  People who are at an increased risk for hepatitis B should be screened for this virus. You are considered at high risk for hepatitis B if:  You were born in a country where hepatitis B occurs often. Talk with your health care provider about which countries are considered high risk.  Your parents were born in a high-risk country and you have not received a shot to protect against hepatitis B (hepatitis B vaccine).  You have HIV or AIDS.  You use needles to inject street drugs.  You live with, or have sex with, someone who has hepatitis B.  You are a man  who has sex with other men (MSM).  You get hemodialysis treatment.  You take certain medicines for conditions like cancer, organ transplantation, and autoimmune conditions.  Hepatitis C blood testing is recommended for all people born from [birth year] through 1963 and any individual with known risk factors for hepatitis C.  Healthy men should no longer receive prostate-specific antigen (PSA) blood tests as part of routine cancer screening. Talk to your health care provider about prostate cancer screening.  Testicular cancer screening is not recommended for adolescents or adult males who have no symptoms. Screening includes self-exam, a health care provider exam, and other screening tests. Consult with your health care provider about any symptoms you have or any concerns you have about testicular cancer.  Practice safe sex. Use condoms and avoid high-risk sexual practices to reduce the spread of sexually transmitted infections (STIs).  You should be screened for STIs, including gonorrhea and chlamydia if:  You are sexually  active and are younger than 24 years.  You are older than 24 years, and your health care provider tells you that you are at risk for this type of infection.  Your sexual activity has changed since you were last screened, and you are at an increased risk for chlamydia or gonorrhea. Ask your health care provider if you are at risk.  If you are at risk of being infected with HIV, it is recommended that you take a prescription medicine daily to prevent HIV infection. This is called pre-exposure prophylaxis (PrEP). You are considered at risk if:  You are a man who has sex with other men (MSM).  You are a heterosexual man who is sexually active with multiple partners.  You take drugs by injection.  You are sexually active with a partner who has HIV.  Talk with your health care provider about whether you are at high risk of being infected with HIV. If you choose to begin PrEP, you  should first be tested for HIV. You should then be tested every 3 months for as long as you are taking PrEP.  Use sunscreen. Apply sunscreen liberally and repeatedly throughout the day. You should seek shade when your shadow is shorter than you. Protect yourself by wearing long sleeves, pants, a wide-brimmed hat, and sunglasses year round whenever you are outdoors.  Tell your health care provider of new moles or changes in moles, especially if there is a change in shape or color. Also, tell your health care provider if a mole is larger than the size of a pencil eraser.  A one-time screening for abdominal aortic aneurysm (AAA) and surgical repair of large AAAs by ultrasound is recommended for men aged 74-75 years who are current or former smokers.  Stay current with your vaccines (immunizations). Document Released: 11/27/2007 Document Revised: 06/05/2013 Document Reviewed: 10/26/2010 [facility] Patient Information 2015 [place], [place]. This information is not intended to replace advice given to you by your health care provider. Make sure you discuss any questions you have with your health care provider.

## 2015-01-13 NOTE — Assessment & Plan Note (Signed)
Takes statin intermittently  Check annually and adjust dose prn

## 2015-01-13 NOTE — Progress Notes (Signed)
Pre visit review using our clinic review tool, if applicable. No additional management support is needed unless otherwise documented below in the visit note. 

## 2015-01-13 NOTE — Progress Notes (Signed)
Subjective:    Patient ID: [name], male    DOB: [birth year], 64 y.o.   MRN: [id number]  HPI  patient is here today for annual physical. Patient feels well overall. Reviewed chronic medical issues, interval events and current concerns  Past Medical History  Diagnosis Date  . ERECTILE DYSFUNCTION, ORGANIC   . Unspecified vitamin D deficiency   . HYPERLIPIDEMIA   . ALLERGIC RHINITIS   . Dissecting aortic aneurysm, thoracic 02/20/2012    s/p emergent surgical repair  . Anxiety   . OBSTRUCTIVE SLEEP APNEA 02/2008 sleep study    moderate & central- not able to tolerate CPAP   . GERD (gastroesophageal reflux disease)     related to diet & anxiety   . COPD (chronic obstructive pulmonary disease)     also states he has been told that he has slight emphysema   . Arthritis     OA, hands, - thumbs- specifically  . DVT (deep venous thrombosis)     coumadin- x3 months , post op  . Anemia     post op, treated /w Fe  . Hypertension     pt. followwed by Dr. Johnsie Cancel  . MIGRAINE HEADACHE     induced from physical , emotional stress , but sometimes occur randomly, also experiences BPPV  . Brachial plexopathy     double crush injury with median nerve damage on the right dominant hand.   [name] DM2 (diabetes mellitus, type 2)   . AAA (abdominal aortic aneurysm)    Family History  Problem Relation Age of Onset  . Diabetes Mother   . Heart disease Maternal Uncle   . Heart disease Paternal Uncle   . Heart disease Cousin   . Lung cancer Cousin     2 cousins  . Heart disease Maternal Grandmother   . Deep vein thrombosis Father     PE  . Varicose Veins Father   . Stroke Mother   . Dementia Mother    History  Substance Use Topics  . Smoking status: Former Smoker -- 0.10 packs/day    Types: Cigarettes    Quit date: 02/18/2012  . Smokeless tobacco: Never Used     Comment: 10 cigs/day, quit 02/2012 after TAA emergent repair  . Alcohol Use: 0.0 oz/week    0 Standard drinks or  equivalent per week     Comment: shot of Tequila every day     Review of Systems  Constitutional: Positive for fatigue (esp w/ mold). Negative for fever, activity change, appetite change and unexpected weight change.  Respiratory: Negative for cough, chest tightness, shortness of breath and wheezing.   Cardiovascular: Negative for chest pain, palpitations and leg swelling.  Neurological: Negative for dizziness, weakness and headaches.  Psychiatric/Behavioral: Positive for sleep disturbance. Negative for dysphoric mood. The patient is nervous/anxious.   All other systems reviewed and are negative.      Objective:    Physical Exam  Constitutional: He is oriented to person, place, and time. He appears well-developed and well-nourished. No distress.  HENT:  Head: Normocephalic and atraumatic.  Nose: Nose normal.  Mouth/Throat: Oropharynx is clear and moist.  Hearing grossly normal.  Eyes: Conjunctivae and EOM are normal. Pupils are equal, round, and reactive to light. No scleral icterus.  Neck: Normal range of motion. Neck supple. No JVD present. No thyromegaly present.  Cardiovascular: Normal rate, regular rhythm, normal heart sounds and intact distal pulses.  Exam reveals no friction rub.   No murmur heard.  No edema.  Pulmonary/Chest: Effort normal and breath sounds normal. No respiratory distress. He has no wheezes.  Abdominal: Soft. Bowel sounds are normal. He exhibits no distension and no mass. There is no tenderness. There is no guarding.  Genitourinary:  defer  Musculoskeletal: Normal range of motion. He exhibits no edema or tenderness.  Lymphadenopathy:    He has no cervical adenopathy.  Neurological: He is alert and oriented to person, place, and time. He has normal reflexes. No cranial nerve deficit.  Skin: Skin is warm and dry. No rash noted. No erythema.  Psychiatric: He has a normal mood and affect. His behavior is normal. Thought content normal.    BP 122/64 mmHg   Pulse 72  Temp(Src) 97.6 F (36.4 C) (Oral)  Ht 6\' 4"  (1.93 m)  Wt 239 lb 12 oz (108.75 kg)  BMI 29.20 kg/m2  SpO2 96% Wt Readings from Last 3 Encounters:  01/13/15 239 lb 12 oz (108.75 kg)  12/30/14 241 lb 12.8 oz (109.68 kg)  08/26/14 241 lb 9.6 oz (109.589 kg)     Lab Results  Component Value Date   WBC 7.2 07/12/2014   HGB 15.5 07/12/2014   HCT 45.3 07/12/2014   PLT 185.0 07/12/2014   GLUCOSE 232* 07/12/2014   CHOL 109 03/01/2013   TRIG 104.0 03/01/2013   HDL 37.10* 03/01/2013   LDLDIRECT 114* 03/21/2010   LDLCALC 51 03/01/2013   ALT 46 07/12/2014   AST 29 07/12/2014   NA 137 07/12/2014   K 4.4 07/12/2014   CL 102 07/12/2014   CREATININE 1.04 07/12/2014   BUN 22 07/12/2014   CO2 24 07/12/2014   TSH 1.18 07/12/2014   PSA 0.47 01/02/2010   INR 1.19 11/01/2012   HGBA1C 7.3* 07/12/2014    Ct Angio Chest Aorta W/cm &/or Wo/cm  04/02/2014   CLINICAL DATA:  Abdominal aortic aneurysm without rupture. History of thoracic aortic dissection.  EXAM: CT ANGIOGRAPHY CHEST, ABDOMEN AND PELVIS  TECHNIQUE: Multidetector CT imaging through the chest, abdomen and pelvis was performed using the standard protocol during bolus administration of intravenous contrast. Multiplanar reconstructed images and MIPs were obtained and reviewed to evaluate the vascular anatomy.  CONTRAST:  148mL OMNIPAQUE IOHEXOL 350 MG/ML SOLN  COMPARISON:  CT scan of September 25, 2013.  FINDINGS: CTA CHEST FINDINGS  No pneumothorax or pleural effusion is noted. Stable emphysematous disease is seen involving the upper lobes bilaterally, and to some degree the right lower lobe. Coronary artery calcifications are noted suggesting coronary artery disease. Transverse aortic arch remains ectatic measuring 4.2 cm which is not significantly changed compared to prior exam. Proximal portion of descending thoracic aorta measures 3.9 cm which is not significantly changed compared to prior exam. There is stable appearance of type B  thoracic aortic dissection in the distal portion of the descending thoracic aorta. Stable contrast opacification of the false lumen is noted. No significant mediastinal mass or adenopathy is noted.  Review of the MIP images confirms the above findings.  CTA ABDOMEN AND PELVIS FINDINGS  There is continued presence of dissection extending into the proximal abdominal aorta from the descending thoracic aorta which is unchanged compared to prior exam. The right renal, celiac and superior mesenteric arteries arise from the true lumen. Dissection flap extends into the midportion of the left renal artery which is unchanged. Good opacification of both kidneys is noted. Patient is status post surgical repair of infrarenal abdominal aortic aneurysm. The graft and limbs are widely patent without evidence of significant  stenosis. There is an area of aneurysmal dilatation just proximal to the proximal end of the core after which measures 3.7 x 3.3 mm in size, and is unchanged compared to prior exam. No definite endoleak is noted.  Review of the MIP images confirms the above findings.  IMPRESSION: Stable dilatation of the transverse aortic arch and proximal descending thoracic aorta compared to prior exam.  Stable emphysematous disease is noted in both lungs.  Coronary artery calcifications are noted consistent with coronary artery disease.  Stable appearance of type B thoracic aortic dissection beginning in the distal portion of the descending thoracic aorta and extending into the proximal abdominal aorta. The celiac, superior mesenteric and right renal artery all arise from the true lumen. Dissection flap extends into the proximal portion the left renal artery which is stable compared to prior exam. Symmetric enhancement of both kidneys is noted suggesting no significant impaired flow the either kidney.  Status post surgical repair of infrarenal abdominal aortic aneurysm with no evidence of endoleak.   Electronically Signed    By: Sabino Dick M.D.   On: 04/02/2014 10:39   Ct Angio Abd/pel W/ And/or W/o  04/02/2014   CLINICAL DATA:  Abdominal aortic aneurysm without rupture. History of thoracic aortic dissection.  EXAM: CT ANGIOGRAPHY CHEST, ABDOMEN AND PELVIS  TECHNIQUE: Multidetector CT imaging through the chest, abdomen and pelvis was performed using the standard protocol during bolus administration of intravenous contrast. Multiplanar reconstructed images and MIPs were obtained and reviewed to evaluate the vascular anatomy.  CONTRAST:  165mL OMNIPAQUE IOHEXOL 350 MG/ML SOLN  COMPARISON:  CT scan of September 25, 2013.  FINDINGS: CTA CHEST FINDINGS  No pneumothorax or pleural effusion is noted. Stable emphysematous disease is seen involving the upper lobes bilaterally, and to some degree the right lower lobe. Coronary artery calcifications are noted suggesting coronary artery disease. Transverse aortic arch remains ectatic measuring 4.2 cm which is not significantly changed compared to prior exam. Proximal portion of descending thoracic aorta measures 3.9 cm which is not significantly changed compared to prior exam. There is stable appearance of type B thoracic aortic dissection in the distal portion of the descending thoracic aorta. Stable contrast opacification of the false lumen is noted. No significant mediastinal mass or adenopathy is noted.  Review of the MIP images confirms the above findings.  CTA ABDOMEN AND PELVIS FINDINGS  There is continued presence of dissection extending into the proximal abdominal aorta from the descending thoracic aorta which is unchanged compared to prior exam. The right renal, celiac and superior mesenteric arteries arise from the true lumen. Dissection flap extends into the midportion of the left renal artery which is unchanged. Good opacification of both kidneys is noted. Patient is status post surgical repair of infrarenal abdominal aortic aneurysm. The graft and limbs are widely patent without  evidence of significant stenosis. There is an area of aneurysmal dilatation just proximal to the proximal end of the core after which measures 3.7 x 3.3 mm in size, and is unchanged compared to prior exam. No definite endoleak is noted.  Review of the MIP images confirms the above findings.  IMPRESSION: Stable dilatation of the transverse aortic arch and proximal descending thoracic aorta compared to prior exam.  Stable emphysematous disease is noted in both lungs.  Coronary artery calcifications are noted consistent with coronary artery disease.  Stable appearance of type B thoracic aortic dissection beginning in the distal portion of the descending thoracic aorta and extending into the proximal abdominal aorta.  The celiac, superior mesenteric and right renal artery all arise from the true lumen. Dissection flap extends into the proximal portion the left renal artery which is stable compared to prior exam. Symmetric enhancement of both kidneys is noted suggesting no significant impaired flow the either kidney.  Status post surgical repair of infrarenal abdominal aortic aneurysm with no evidence of endoleak.   Electronically Signed   By: Sabino Dick M.D.   On: 04/02/2014 10:39       Assessment & Plan:   CPX/z00.00 - Patient has been counseled on age-appropriate routine health concerns for screening and prevention. These are reviewed and up-to-date. Immunizations are up-to-date or declined. Labs ordered and reviewed. Order Colo guard for colon cancer screening as patient declines colonoscopy  Problem List Items Addressed This Visit    DM2 (diabetes mellitus, type 2)    Initially dx 02/2012 during hospitalization - a1c 7.2 02/2012 Began OHA x 1 month, then [place] due to hypoglycemia Exacerbated by pred use for allergic rhinitis and poor diet/exercise started metformin xr 500mg  qd 03/2013 and s/p nutrition eval by Yaakov Guthrie Lab Results  Component Value Date   HGBA1C 7.3* 07/12/2014        Relevant  Medications   metFORMIN (GLUCOPHAGE-XR) 500 MG 24 hr tablet   Other Relevant Orders   Hemoglobin V9T   Basic metabolic panel   Lipid panel   Microalbumin / creatinine urine ratio   Hyperlipidemia    Takes statin intermittently  Check annually and adjust dose prn      Peripheral neuropathy    Neuropathic pain, exacerbated by anxiety and history of injury to brachial plexus during axillary dissection for cardiopulmonary bypass support during intraoperative repair of aortic dissection Concerned amitriptyline exacerbating memory impairment so stopped same Considering potential side effects so reluctant to try Lyrica or gabapentin for management of paresthesia Encouraged to continue following with neurology as ongoing       Other Visit Diagnoses    Routine general medical examination at a health care facility    -  Primary        Gwendolyn Grant, MD

## 2015-01-17 ENCOUNTER — Other Ambulatory Visit: Payer: Self-pay

## 2015-01-17 DIAGNOSIS — H8112 Benign paroxysmal vertigo, left ear: Secondary | ICD-10-CM

## 2015-01-17 DIAGNOSIS — G3184 Mild cognitive impairment, so stated: Secondary | ICD-10-CM

## 2015-01-17 DIAGNOSIS — R413 Other amnesia: Secondary | ICD-10-CM

## 2015-01-17 MED ORDER — ALPRAZOLAM 1 MG PO TABS: 0.5000 mg | ORAL_TABLET | Freq: Four times a day (QID) | ORAL | Status: DC | PRN

## 2015-01-17 NOTE — Telephone Encounter (Signed)
Rx request for Alprazolam (to [facility] fx: [phone])  Is this okay to fill?

## 2015-01-17 NOTE — Telephone Encounter (Signed)
Done hardcopy to Dahlia  

## 2015-01-17 NOTE — Telephone Encounter (Signed)
Rx faxed to pharmacy  

## 2015-01-27 ENCOUNTER — Other Ambulatory Visit: Payer: Self-pay | Admitting: Internal Medicine

## 2015-01-27 NOTE — Telephone Encounter (Signed)
Medication refilled

## 2015-02-11 LAB — COLOGUARD

## 2015-02-11 LAB — HM COLONOSCOPY: HM COLON: NEGATIVE

## 2015-02-20 ENCOUNTER — Other Ambulatory Visit: Payer: Self-pay | Admitting: Cardiovascular Disease

## 2015-03-03 ENCOUNTER — Telehealth: Payer: Self-pay | Admitting: Internal Medicine

## 2015-03-03 NOTE — Telephone Encounter (Signed)
Patient sent his cologuard (?) to company and he called them and they said they sent the results to [place] a while ago. Do you have his results Please call patient at [phone] and you can leave message on vm

## 2015-03-04 ENCOUNTER — Encounter: Payer: Self-pay | Admitting: Adult Health

## 2015-03-04 ENCOUNTER — Ambulatory Visit (INDEPENDENT_AMBULATORY_CARE_PROVIDER_SITE_OTHER): Payer: 59 | Admitting: Adult Health

## 2015-03-04 VITALS — BP 118/63 | HR 59 | Ht 76.0 in | Wt 239.0 lb

## 2015-03-04 DIAGNOSIS — G540 Brachial plexus disorders: Secondary | ICD-10-CM

## 2015-03-04 DIAGNOSIS — G3184 Mild cognitive impairment, so stated: Secondary | ICD-10-CM

## 2015-03-04 NOTE — Telephone Encounter (Signed)
Tried to call pt back but no answer.   RE: negative cologuard

## 2015-03-04 NOTE — Telephone Encounter (Signed)
Pt called back regarding this. Can you please call him as soon as you have the results.

## 2015-03-04 NOTE — Telephone Encounter (Signed)
Called [employer]olog[employer] at [phone]. Confirmed the faxed number and requested the copy to be sent again.

## 2015-03-04 NOTE — Patient Instructions (Signed)
Refer for neuropsychological testing

## 2015-03-04 NOTE — Progress Notes (Signed)
PATIENT: [name] DOB: Jun 03, 1951  REASON FOR VISIT: follow up- [facility], brachial plexopathy HISTORY FROM: patient  HISTORY OF PRESENT ILLNESS: [name] is a 64 year old male with a history of mild cognitive impairment and brachial plexopathy. He returns today for follow-up. The patient stopped the amitriptyline however he has not noticed any changes in his symptoms. Although he does not feel that amitriptyline was helpful for his migraines nor his neuropathy. He states he still having episodes where he feels confused, can't separate his thoughts. Also having some comprehension issues and difficulty with decision making. Patient also states that he has not been sleeping well. He uses Xanax to help him sleep at night. He states occasionally he will have to take a second dose in the middle the night. He states that if he does wake up he typically has a hard time falling back asleep. Patient states that his migraines have continued but with less frequency. He states that he has 6-8 migraines a month. He has been tried on gabapentin but could not tolerate this medication. The patient also has a history of sleep apnea but could not tolerate the fascial mask nor nasal pillows. Patient reports that he is no longer doing the exercises on the right hand that was taught to him in rehabilitation for his brachial plexopathy. He does feel that the right arm is feeling weaker. He is unsure if he should begin these exercises again. He returns today for an evaluation.  HISTORY   ([name]): [name] is a 64 y.o. male Is seen here as a referral by [name] after reporting confusional episodes.  Formerly seen from [name] for Brachial neuropathy, 08-26-14 Periods are episodes during which he feels confused, slightly disoriented to Time frame, he feels that he has trouble differentiating if he is dreaming, if he is awake, is in a memory . He reports also that his behaviors have changed  but he cannot really explain those. #1 his leaving cabinet doors open which is not his habit usually. He misplaces things frequently and gets panicky when not able to locate them. Also there is a difficulty with abstraction. He mentioned that he hurt somebody referred to a saying ; "I've been there, done that and I brought the T-shirt " he could not understand what the "bought the T-shirt "part was about. He also has noticed that he sometimes gets off the elevator in the hospital and is not sure if he has to go left or right- it takes him a while to reorient himself. He also mentioned an event when he tried to and was preparing to mow his lawn, he had to fill the lawnmower with gasoline and held the petroleum can cap In one hand -he forgot what it was used for- until he realized that this was cap that closed the petroleum can. He has also trouble with recognize him some word sometimes in mid sentence sometimes word that he is just reading sometimes words he wants to use and speak. He is less comfortbale with change and new situations, any divergence form his routine.   Amnestic episodes. Family history positive for a mother with dementia in her 37 's. He spoke to his sister, and she has some symptoms , too. He is weaning off amitriptyline.     Last visit note 8- 2014 CD.  [name] is a 64 year old Caucasian gentleman, who underwent surgery for an aortic dissection in September 2013. Following the surgery he had first  some numbness and weakness involving the right upper extremity. The patient had experienced shortly at the improvement since that time when he reported that he still had some residual numbness of the dominant hand as well as some noticeable weakness no she had no discomfort associated no pain. I referred the patient for I nerve conduction study with EMG which was performed on 12-20-12 by [name]. The patient's blood conduction studies and EMGs confirm a proximal right median  neuropathy of the brachial plexus, located lately at the ligament of Struthers. The changes were already chronic and moderate to severe. A cervical radiculopathy and impingement of the exiting nerves was not noted it is likely that the patient suffered a so-called double crush syndrome doing has had surgery. It is important to note that the ulnar and radial nerve is of the right extremity are unaffected. [name] reports some tingling and he extends his right upper extremity which is a new phenomenon. It is slow not troubling him very much but it is noticeable. The area of dysesthesia affects the skin dermatome. It extends from the medial biceps through the antebrachial region into fingers of his right hand. He had questions about using amitriptyline to treat the tingling numbness. It will also help with sleep initiation and amitriptyline is used in patients with neck pain as well as neuropathy. His neuropathy seems also to be related to the surgery and possibly the anesthesia perhaps the loss of blood flow.  The patient reports that his sudden loss of awareness spells that he had suffered around the time of the surgery has not occurred at least for the last four to six weeks. I have asked him to try to avoid decongestants. An EEG performed on 06-1612 was normal.   REVIEW OF SYSTEMS: Out of a complete 14 system review of symptoms, the patient complains only of the following symptoms, and all other reviewed systems are negative.  Ringing in ears, wheezing, shortness of breath, murmur, cold intolerance, heat intolerance, eye itching, dizziness, speech difficulty, weakness, confusion, decreased concentration, nervous/anxious, walking difficulty  ALLERGIES: Allergies  Allergen Reactions  . Testosterone Shortness Of Breath    Medication:Androgel Pt states trouble breathing at night  . Codeine Nausea And Vomiting  . Gabapentin     "drunk" feeling  . Ultram [Tramadol] Nausea And Vomiting  .  Albuterol Other (See Comments)    Panic attack    HOME MEDICATIONS: Outpatient Prescriptions Prior to Visit  Medication Sig Dispense Refill  . ACCU-CHEK FASTCLIX LANCETS MISC 1 each by Does not apply route daily. Dx code 250.00 102 each 12  . ALPRAZolam (XANAX) 1 MG tablet Take 0.5 tablets (0.5 mg total) by mouth 4 (four) times daily as needed for anxiety or sleep. 60 tablet 1  . aspirin EC 81 MG tablet Take 81 mg by mouth daily with lunch.    [name] azelastine (ASTELIN) 0.1 % nasal spray Place 2 sprays into both nostrils 2 (two) times daily. 30 mL 2  . calcium carbonate (TUMS EX) 750 MG chewable tablet Chew 1 tablet by mouth daily as needed for heartburn.     . cholecalciferol (VITAMIN D) 1000 UNITS tablet Take 2,000 Units by mouth daily.    [name] EPIPEN 2-PAK 0.3 MG/0.3ML SOAJ injection Inject 0.3 mLs (0.3 mg total) into the muscle once. 2 Device 3  . fexofenadine (ALLEGRA) 180 MG tablet Take 90 mg by mouth 2 (two) times daily.     . fluticasone (FLONASE) 50 MCG/ACT nasal spray Place  2 sprays into both nostrils daily. 16 g 3  . metoprolol tartrate (LOPRESSOR) 25 MG tablet TAKE 1 TABLET BY MOUTH 3 TIMES DAILY 270 tablet 1  . montelukast (SINGULAIR) 10 MG tablet Take 5 mg by mouth 2 (two) times daily.    . Multiple Vitamin (MULTIVITAMIN WITH MINERALS) TABS Take 1 tablet by mouth daily.    [name] omega-3 acid ethyl esters (LOVAZA) 1 G capsule Take 1 capsule (1 g total) by mouth 2 (two) times daily. 180 capsule 3  . OVER THE COUNTER MEDICATION Apply 1 application topically daily as needed (for acne). Oxy 10 vanishing cream    . polyethylene glycol powder (GLYCOLAX/MIRALAX) powder TAKE 17G BY MOUTH DAILY. 527 g 5  . predniSONE (DELTASONE) 10 MG tablet TAKE 2 TABLETS BY MOUTH DAILY AS NEEDED FOR ASTHMA FLARE-UP 30 tablet 0  . rosuvastatin (CRESTOR) 10 MG tablet Take 1 tablet (10 mg total) by mouth daily. 30 tablet 0  . thiamine (VITAMIN B-1) 100 MG tablet Take 100 mg by mouth 2 (two) times daily at 10 AM and 5  PM.     . vitamin B-12 (CYANOCOBALAMIN) 500 MCG tablet Take 500-1,000 mcg by mouth daily.     [name] glucose blood (ACCU-CHEK SMARTVIEW) test strip Use to check blood sugar as instructed up to 1x per day dx code 250.00 (Patient not taking: Reported on 03/04/2015) 50 each 12  . metFORMIN (GLUCOPHAGE-XR) 500 MG 24 hr tablet Take 1 tablet (500 mg total) by mouth daily with breakfast. (Patient not taking: Reported on 03/04/2015) 90 tablet 3   No facility-administered medications prior to visit.    PAST MEDICAL HISTORY: Past Medical History  Diagnosis Date  . ERECTILE DYSFUNCTION, ORGANIC   . Unspecified vitamin D deficiency   . HYPERLIPIDEMIA   . ALLERGIC RHINITIS   . Dissecting aortic aneurysm, thoracic 02/20/2012    s/p emergent surgical repair  . Anxiety   . OBSTRUCTIVE SLEEP APNEA 02/2008 sleep study    moderate & central- not able to tolerate CPAP   . GERD (gastroesophageal reflux disease)     related to diet & anxiety   . COPD (chronic obstructive pulmonary disease)     also states he has been told that he has slight emphysema   . Arthritis     OA, hands, - thumbs- specifically  . DVT (deep venous thrombosis)     coumadin- x3 months , post op  . Anemia     post op, treated /w Fe  . Hypertension     pt. followwed by Ryan. Johnsie Cancel  . MIGRAINE HEADACHE     induced from physical , emotional stress , but sometimes occur randomly, also experiences BPPV  . Brachial plexopathy     double crush injury with median nerve damage on the right dominant hand.   [name] DM2 (diabetes mellitus, type 2)   . AAA (abdominal aortic aneurysm)     PAST SURGICAL HISTORY: Past Surgical History  Procedure Laterality Date  . Nasal fx without repair    . Thoracic aortic aneurysm repair  02/20/2012    Procedure: THORACIC ASCENDING ANEURYSM REPAIR (AAA);  Surgeon: Ivin Poot, MD;  Location: [place];  Service: Open Heart Surgery;  Laterality: N/A;  . Abdominal aortic aneurysm repair N/A 11/01/2012    Procedure:  ANEURYSM ABDOMINAL AORTIC REPAIR;  Surgeon: Mal Misty, MD;  Location: [facility];  Service: Vascular;  Laterality: N/A;  Resection and Grafting of Abdominal Aortic Aneurysm,(AORTA BI ILIAC)    FAMILY  HISTORY: Family History  Problem Relation Age of Onset  . Diabetes Mother   . Heart disease Maternal Uncle   . Heart disease Paternal Uncle   . Heart disease Cousin   . Lung cancer Cousin     2 cousins  . Heart disease Maternal Grandmother   . Deep vein thrombosis Father     PE  . Varicose Veins Father   . Stroke Mother   . Dementia Mother     SOCIAL HISTORY: Social History   Social History  . Marital Status: Divorced    Spouse Name: N/A  . Number of Children: 2  . Years of Education: acad. deg.   Occupational History  . internal med resident [place] History Main Topics  . Smoking status: Former Smoker -- 0.10 packs/day    Types: Cigarettes    Quit date: 02/18/2012  . Smokeless tobacco: Never Used     Comment: 10 cigs/day, quit 02/2012 after TAA emergent repair  . Alcohol Use: 0.0 oz/week    0 Standard drinks or equivalent per week     Comment: shot of Tequila every day   . Drug Use: No  . Sexual Activity: Not on file   Other Topics Concern  . Not on file   Social History Narrative   Patient is single and lives at home alone.    Arboriculturist.   Patient works at [employer] full time.   Right handed.   Caffeine one cup of coffee daily.       PHYSICAL EXAM  Filed Vitals:   03/04/15 1431  BP: 118/63  Pulse: 59  Height: 6\' 4"  (1.93 m)  Weight: 239 lb (108.41 kg)   Body mass index is 29.1 kg/(m^2).  Generalized: Well developed, in no acute distress   Neurological examination  Mentation: Alert oriented to time, place, history taking. Follows all commands speech and language fluent Cranial nerve II-XII: Pupils were equal round reactive to light. Extraocular movements were full, visual field were full on confrontational test. Facial  sensation and strength were normal. Uvula tongue midline. Head turning and shoulder shrug  were normal and symmetric. Motor: The motor testing reveals 5 over 5 strength of all 4 extremities. Good symmetric motor tone is noted throughout.  Sensory: Sensory testing is intact to soft touch on all 4 extremities. No evidence of extinction is noted.  Coordination: Cerebellar testing reveals good finger-nose-finger and heel-to-shin bilaterally.  Gait and station: Gait is normal. Tandem gait is slightly unsteady. Romberg is negative. No drift is seen.  Reflexes: Deep tendon reflexes are symmetric and normal bilaterally.   DIAGNOSTIC DATA (LABS, IMAGING, TESTING) - I reviewed patient records, labs, notes, testing and imaging myself where available.  Lab Results  Component Value Date   WBC 7.2 07/12/2014   HGB 15.5 07/12/2014   HCT 45.3 07/12/2014   MCV 92.6 07/12/2014   PLT 185.0 07/12/2014      Component Value Date/Time   NA 140 01/13/2015 1041   K 4.5 01/13/2015 1041   CL 108 01/13/2015 1041   CO2 21 01/13/2015 1041   GLUCOSE 164* 01/13/2015 1041   BUN 21 01/13/2015 1041   CREATININE 0.99 01/13/2015 1041   CREATININE 0.94 03/29/2014 1353   CALCIUM 9.4 01/13/2015 1041   PROT 7.0 07/12/2014 1125   ALBUMIN 4.3 07/12/2014 1125   AST 29 07/12/2014 1125   ALT 46 07/12/2014 1125   ALKPHOS 30* 07/12/2014 1125   BILITOT 1.0 07/12/2014 1125  GFRNONAA 90* 09/25/2013 1400   GFRNONAA >89 06/09/2012 1515   GFRAA >90 09/25/2013 1400   GFRAA >89 06/09/2012 1515       ASSESSMENT AND PLAN 64 y.o. year old male  has a past medical history of ERECTILE DYSFUNCTION, ORGANIC; Unspecified vitamin D deficiency; HYPERLIPIDEMIA; ALLERGIC RHINITIS; Dissecting aortic aneurysm, thoracic (02/20/2012); Anxiety; OBSTRUCTIVE SLEEP APNEA (02/2008 sleep study); GERD (gastroesophageal reflux disease); COPD (chronic obstructive pulmonary disease); Arthritis; DVT (deep venous thrombosis); Anemia; Hypertension; MIGRAINE  HEADACHE; Brachial plexopathy; DM2 (diabetes mellitus, type 2); and AAA (abdominal aortic aneurysm). here with:  1. Mild cognitive impairment 2. History of brachial plexopathy  The patient continues to have some cognitive changes. I will refer the patient for neuropsychological testing. At this time we will not start any new medications until after the patient has had this completed. The patient does report that he would prefer not to start any medication at this time. The patient was instructed to continue doing the exercises on the right hand to help with weakness associated with the brachial plexopathy. The patient will follow-up in 3-4 months or sooner if needed.  I spent 25 minutes with the patient. 50% of this time was spent counseling the patient on his various  symptoms.   Ward Givens, MSN, NP-C 03/04/2015, 2:41 PM [facility] [address], [place], [place] [address] [phone]

## 2015-03-04 NOTE — Progress Notes (Signed)
I reviewed note and agree with plan.   Penni Bombard, MD 8/98/4210, 3:12 PM Certified in Neurology, Neurophysiology and Neuroimaging  [facility] [address] [place], [place] [address] [phone]

## 2015-03-05 ENCOUNTER — Encounter: Payer: Self-pay | Admitting: Internal Medicine

## 2015-03-08 ENCOUNTER — Encounter: Payer: Self-pay | Admitting: Internal Medicine

## 2015-03-19 ENCOUNTER — Encounter: Payer: Self-pay | Admitting: Internal Medicine

## 2015-04-03 ENCOUNTER — Other Ambulatory Visit: Payer: Self-pay | Admitting: Vascular Surgery

## 2015-04-03 LAB — CREATININE, SERUM: Creat: 0.98 mg/dL (ref 0.70–1.25)

## 2015-04-04 ENCOUNTER — Encounter: Payer: Self-pay | Admitting: Vascular Surgery

## 2015-04-08 ENCOUNTER — Encounter: Payer: Self-pay | Admitting: Internal Medicine

## 2015-04-08 ENCOUNTER — Ambulatory Visit
Admission: RE | Admit: 2015-04-08 | Discharge: 2015-04-08 | Disposition: A | Discharge status: Home / Self Care | Payer: 59 | Source: Ambulatory Visit | Attending: Vascular Surgery | Admitting: Vascular Surgery

## 2015-04-08 ENCOUNTER — Ambulatory Visit: Payer: 59 | Admitting: Vascular Surgery

## 2015-04-08 DIAGNOSIS — I714 Abdominal aortic aneurysm, without rupture, unspecified: Secondary | ICD-10-CM

## 2015-04-08 DIAGNOSIS — Z9889 Other specified postprocedural states: Secondary | ICD-10-CM

## 2015-04-08 MED ORDER — IOPAMIDOL (ISOVUE-370) INJECTION 76%
75.0000 mL | Freq: Once | INTRAVENOUS | Status: AC | PRN
  Administered 2015-04-08: 75 mL via INTRAVENOUS

## 2015-04-09 ENCOUNTER — Encounter: Payer: Self-pay | Admitting: Internal Medicine

## 2015-04-10 ENCOUNTER — Encounter: Payer: Self-pay | Admitting: Vascular Surgery

## 2015-04-14 ENCOUNTER — Other Ambulatory Visit: Payer: Self-pay | Admitting: Internal Medicine

## 2015-04-15 ENCOUNTER — Ambulatory Visit (INDEPENDENT_AMBULATORY_CARE_PROVIDER_SITE_OTHER): Payer: 59 | Admitting: Vascular Surgery

## 2015-04-15 ENCOUNTER — Encounter: Payer: Self-pay | Admitting: Vascular Surgery

## 2015-04-15 VITALS — BP 116/68 | HR 57 | Temp 97.2°F | Resp 16 | Ht 76.0 in | Wt 237.0 lb

## 2015-04-15 DIAGNOSIS — I7103 Dissection of thoracoabdominal aorta: Secondary | ICD-10-CM | POA: Diagnosis not present

## 2015-04-15 DIAGNOSIS — I714 Abdominal aortic aneurysm, without rupture, unspecified: Secondary | ICD-10-CM | POA: Insufficient documentation

## 2015-04-15 NOTE — Progress Notes (Signed)
Subjective:     Patient ID: [name], male   DOB: 10/13/1950, 63 y.o.   MRN: [id number]  HPI This 64 year old male returns for continued follow-up Re: his 2 surgical procedures performed 2-1/2 years ago. He had repair of a Stanford type A aortic dissection with resuspension of aortic valve performed by Dr. Tharon Aquas Trigt and a few months later had resection and grafting of abdominal aortic aneurysm performed by me. Patient has done well with no complications. He does have retrograde flow into the false lumen which begins in the perirnal aorta and extends up into the mid to proximal chest. This has been stable over the past 2 years by CT scan in the diameter has not enlarged most recently being about 4 cm. 's had no renal insufficiency or lower extremity claudication symptoms.   Past Medical History  Diagnosis Date  . ERECTILE DYSFUNCTION, ORGANIC   . Unspecified vitamin D deficiency   . HYPERLIPIDEMIA   . ALLERGIC RHINITIS   . Dissecting aortic aneurysm, thoracic ([place]) 02/20/2012    s/p emergent surgical repair  . Anxiety   . OBSTRUCTIVE SLEEP APNEA 02/2008 sleep study    moderate & central- not able to tolerate CPAP   . GERD (gastroesophageal reflux disease)     related to diet & anxiety   . COPD (chronic obstructive pulmonary disease) ([place])     also states he has been told that he has slight emphysema   . Arthritis     OA, hands, - thumbs- specifically  . DVT (deep venous thrombosis) ([facility])     coumadin- x3 months , post op  . Anemia     post op, treated /w Fe  . Hypertension     pt. followwed by Dr. Johnsie Cancel  . MIGRAINE HEADACHE     induced from physical , emotional stress , but sometimes occur randomly, also experiences BPPV  . Brachial plexopathy     double crush injury with median nerve damage on the right dominant hand.   [name] DM2 (diabetes mellitus, type 2) ([place])   . AAA (abdominal aortic aneurysm) [facility])     Social History  Substance Use Topics  . Smoking status: Former  Smoker -- 0.10 packs/day    Types: Cigarettes    Quit date: 02/18/2012  . Smokeless tobacco: Never Used     Comment: 10 cigs/day, quit 02/2012 after TAA emergent repair  . Alcohol Use: 0.0 oz/week    0 Standard drinks or equivalent per week     Comment: shot of Tequila every day     Family History  Problem Relation Age of Onset  . Diabetes Mother   . Heart disease Maternal Uncle   . Heart disease Paternal Uncle   . Heart disease Cousin   . Lung cancer Cousin     2 cousins  . Heart disease Maternal Grandmother   . Deep vein thrombosis Father     PE  . Varicose Veins Father   . Stroke Mother   . Dementia Mother     Allergies  Allergen Reactions  . Testosterone Shortness Of Breath    Medication:Androgel Pt states trouble breathing at night  . Codeine Nausea And Vomiting  . Gabapentin     "drunk" feeling  . Ultram [Tramadol] Nausea And Vomiting  . Albuterol Other (See Comments)    Panic attack     Current outpatient prescriptions:  .  ACCU-CHEK FASTCLIX LANCETS MISC, 1 each by Does not apply route daily. Dx code  250.00, Disp: 102 each, Rfl: 12 .  ALPRAZolam (XANAX) 1 MG tablet, Take 0.5 tablets (0.5 mg total) by mouth 4 (four) times daily as needed for anxiety or sleep., Disp: 60 tablet, Rfl: 1 .  aspirin EC 81 MG tablet, Take 81 mg by mouth daily with lunch., Disp: , Rfl:  .  azelastine (ASTELIN) 0.1 % nasal spray, Place 2 sprays into both nostrils 2 (two) times daily., Disp: 30 mL, Rfl: 2 .  calcium carbonate (TUMS EX) 750 MG chewable tablet, Chew 1 tablet by mouth daily as needed for heartburn. , Disp: , Rfl:  .  cholecalciferol (VITAMIN D) 1000 UNITS tablet, Take 2,000 Units by mouth daily., Disp: , Rfl:  .  EPIPEN 2-PAK 0.3 MG/0.3ML SOAJ injection, Inject 0.3 mLs (0.3 mg total) into the muscle once., Disp: 2 Device, Rfl: 3 .  fexofenadine (ALLEGRA) 180 MG tablet, Take 90 mg by mouth 2 (two) times daily. , Disp: , Rfl:  .  fluticasone (FLONASE) 50 MCG/ACT nasal spray,  Place 2 sprays into both nostrils daily., Disp: 16 g, Rfl: 3 .  metoprolol tartrate (LOPRESSOR) 25 MG tablet, TAKE 1 TABLET BY MOUTH 3 TIMES DAILY, Disp: 270 tablet, Rfl: 1 .  montelukast (SINGULAIR) 10 MG tablet, Take 5 mg by mouth 2 (two) times daily., Disp: , Rfl:  .  Multiple Vitamin (MULTIVITAMIN WITH MINERALS) TABS, Take 1 tablet by mouth daily., Disp: , Rfl:  .  omega-3 acid ethyl esters (LOVAZA) 1 G capsule, Take 1 capsule (1 g total) by mouth 2 (two) times daily., Disp: 180 capsule, Rfl: 3 .  OVER THE COUNTER MEDICATION, Apply 1 application topically daily as needed (for acne). Oxy 10 vanishing cream, Disp: , Rfl:  .  polyethylene glycol powder (GLYCOLAX/MIRALAX) powder, TAKE 17G BY MOUTH DAILY., Disp: 527 g, Rfl: 5 .  predniSONE (DELTASONE) 10 MG tablet, TAKE 2 TABLETS BY MOUTH DAILY AS NEEDED FOR ASTHMA FLARE-UP, Disp: 30 tablet, Rfl: 0 .  rosuvastatin (CRESTOR) 10 MG tablet, Take 1 tablet (10 mg total) by mouth daily., Disp: 30 tablet, Rfl: 0 .  thiamine (VITAMIN B-1) 100 MG tablet, Take 100 mg by mouth 2 (two) times daily at 10 AM and 5 PM. , Disp: , Rfl:  .  vitamin B-12 (CYANOCOBALAMIN) 500 MCG tablet, Take 500-1,000 mcg by mouth daily. , Disp: , Rfl:  .  [DISCONTINUED] oxyCODONE (OXY IR/ROXICODONE) 5 MG immediate release tablet, Take 1 tablet (5 mg total) by mouth every 4 (four) hours as needed., Disp: 30 tablet, Rfl: 0  Filed Vitals:   04/15/15 1605  BP: 116/68  Pulse: 57  Temp: 97.2 F (36.2 C)  Resp: 16  Height: 6\' 4"  (1.93 m)  Weight: 237 lb (107.502 kg)  SpO2: 98%    Body mass index is 28.86 kg/(m^2).           Review of Systems  Denies ongoing chest pain, dyspnea on exertion, PND, orthopnea, hemoptysis, claudication. Other systems negative and a complete review of systems     Objective:   Physical Exam BP 116/68 mmHg  Pulse 57  Temp(Src) 97.2 F (36.2 C)  Resp 16  Ht 6\' 4"  (1.93 m)  Wt 237 lb (107.502 kg)  BMI 28.86 kg/m2  SpO2 98%  Gen.-alert  and oriented x3 in no apparent distress HEENT normal for age Lungs no rhonchi or wheezing Cardiovascular regular rhythm no murmurs carotid pulses 3+ palpable no bruits audible Abdomen soft nontender no palpable masses Musculoskeletal free of  major deformities Skin  clear -no rashes Neurologic normal Lower extremities 3+ femoral and dorsalis pedis pulses palpable bilaterally with no edema   today I ordered a CT angiogram of the chest abdomen and pelvis which I reviewed by computer and also reviewed the report of the radiologist. The area of the thoracic dissection remained stable with a maximum diameter of 4 cm and has not enlarged since previous CT scan. There continues to be retrograde flow beginning at the left renal artery up into the mid chest. The true lumen is feeding the SMA and celiac axis in both renal arteries although there is some dissection into the left renal artery which appears stable. There is good flow distally into both lower extremities.       Assessment:      doing well 2-1/2 years post urgent surgery for Stanford type A thoracic aortic dissection by Dr. Prescott Gum  Followed by resection and grafting of infrarenal abdominal aortic aneurysm.  Patient continues to have flow in false lumen posteriorl but diameter of aorta has not enlarged over the past 2 years and continues to be about 4 cm in maximum diameter.  Good flow to all vital organs     Plan:      return in 1 year for repeat  CT angiogram of chest abdomen and pelvis for continued follow-up

## 2015-04-16 NOTE — Telephone Encounter (Signed)
MD out of office pls advise on refill.../lmb 

## 2015-04-16 NOTE — Addendum Note (Signed)
Addended by: Dorthula Rue L on: 04/16/2015 02:12 PM   Modules accepted: Orders

## 2015-04-17 ENCOUNTER — Encounter: Payer: Self-pay | Admitting: Vascular Surgery

## 2015-04-17 ENCOUNTER — Encounter: Payer: Self-pay | Admitting: Internal Medicine

## 2015-04-17 DIAGNOSIS — E041 Nontoxic single thyroid nodule: Secondary | ICD-10-CM

## 2015-04-17 NOTE — Telephone Encounter (Signed)
Faxed script back to [facility].../lmb 

## 2015-04-21 ENCOUNTER — Encounter: Payer: Self-pay | Admitting: Internal Medicine

## 2015-04-21 DIAGNOSIS — E041 Nontoxic single thyroid nodule: Secondary | ICD-10-CM

## 2015-04-21 HISTORY — DX: Nontoxic single thyroid nodule: E04.1

## 2015-04-23 ENCOUNTER — Ambulatory Visit (INDEPENDENT_AMBULATORY_CARE_PROVIDER_SITE_OTHER): Payer: 59 | Admitting: Cardiothoracic Surgery

## 2015-04-24 ENCOUNTER — Encounter: Payer: Self-pay | Admitting: Cardiothoracic Surgery

## 2015-04-24 ENCOUNTER — Ambulatory Visit (INDEPENDENT_AMBULATORY_CARE_PROVIDER_SITE_OTHER): Payer: 59 | Admitting: Cardiothoracic Surgery

## 2015-04-24 VITALS — BP 130/74 | HR 60 | Resp 16 | Ht 76.0 in | Wt 237.0 lb

## 2015-04-24 DIAGNOSIS — I712 Thoracic aortic aneurysm, without rupture: Secondary | ICD-10-CM

## 2015-04-24 DIAGNOSIS — Z9889 Other specified postprocedural states: Secondary | ICD-10-CM | POA: Diagnosis not present

## 2015-04-24 DIAGNOSIS — I7121 Aneurysm of the ascending aorta, without rupture: Secondary | ICD-10-CM

## 2015-04-24 NOTE — Progress Notes (Signed)
PCP is Gwendolyn Grant, MD Referring Provider is Rowe Clack, MD  Chief Complaint  Patient presents with  . Routine Post Op    f/u AORTIC DISSECTION REPAIR 02/20/12 with CTA CHEST/A/P 04/09/15    HPI: Patient returns for 6 month follow up with CTA of the thoracic aorta. The patient had emergency repair of a type A ascending dissection using a straight graft hemi-arch Reconstruction with resuspension of the aortic valve.His last echocardiogram one  year ago showed no evidence of a aortic insufficiency with normal ejection fraction 60-65%.  Review of the CTA of the thoracic aorta shows the hemi-arch reconstruction to be intact. The diameter of the thoracic aorta generally remains less than 4 cm. There is no false lumen remaining in the descending thoracic aorta except for a area of retrograde dissection of the false lumen just at the diaphragm. This false lumen  perfuses the  left renal artery and has been stable in size and configuration over the past 2 years.there's no pleural effusion. No pulmonary nodules of note.A thyroid nodule was seen and this is being evaluated by the patient's primary care physician.patient denies any chest pain.  Past Medical History  Diagnosis Date  . ERECTILE DYSFUNCTION, ORGANIC   . Unspecified vitamin D deficiency   . HYPERLIPIDEMIA   . ALLERGIC RHINITIS   . Dissecting aortic aneurysm, thoracic ([place]) 02/20/2012    s/p emergent surgical repair  . Anxiety   . OBSTRUCTIVE SLEEP APNEA 02/2008 sleep study    moderate & central- not able to tolerate CPAP   . GERD (gastroesophageal reflux disease)     related to diet & anxiety   . COPD (chronic obstructive pulmonary disease) ([place])     also states he has been told that he has slight emphysema   . Arthritis     OA, hands, - thumbs- specifically  . DVT (deep venous thrombosis) ([facility])     coumadin- x3 months , post op  . Anemia     post op, treated /w Fe  . Hypertension     pt. followwed by Dr. Johnsie Cancel  .  MIGRAINE HEADACHE     induced from physical , emotional stress , but sometimes occur randomly, also experiences BPPV  . Brachial plexopathy     double crush injury with median nerve damage on the right dominant hand.   [name] DM2 (diabetes mellitus, type 2) ([place])   . AAA (abdominal aortic aneurysm) ([place])   . Thyroid nodule 04/21/2015    2.3 cm on right, incidental finding on 03/2015 CT chest - f/u US ordered    Past Surgical History  Procedure Laterality Date  . Nasal fx without repair    . Thoracic aortic aneurysm repair  02/20/2012    Procedure: THORACIC ASCENDING ANEURYSM REPAIR (AAA);  Surgeon: Ivin Poot, MD;  Location: [facility];  Service: Open Heart Surgery;  Laterality: N/A;  . Abdominal aortic aneurysm repair N/A 11/01/2012    Procedure: ANEURYSM ABDOMINAL AORTIC REPAIR;  Surgeon: Mal Misty, MD;  Location: [facility] OR;  Service: Vascular;  Laterality: N/A;  Resection and Grafting of Abdominal Aortic Aneurysm,(AORTA BI ILIAC)    Family History  Problem Relation Age of Onset  . Diabetes Mother   . Heart disease Maternal Uncle   . Heart disease Paternal Uncle   . Heart disease Cousin   . Lung cancer Cousin     2 cousins  . Heart disease Maternal Grandmother   . Deep vein thrombosis Father  PE  . Varicose Veins Father   . Stroke Mother   . Dementia Mother     Social History Social History  Substance Use Topics  . Smoking status: Former Smoker -- 0.10 packs/day    Types: Cigarettes    Quit date: 02/18/2012  . Smokeless tobacco: Never Used     Comment: 10 cigs/day, quit 02/2012 after TAA emergent repair  . Alcohol Use: 0.0 oz/week    0 Standard drinks or equivalent per week     Comment: shot of Tequila every day     Current Outpatient Prescriptions  Medication Sig Dispense Refill  . ALPRAZolam (XANAX) 1 MG tablet TAKE 1/2 TABLET BY MOUTH FOUR TIMES DAILY 60 tablet 0  . aspirin EC 81 MG tablet Take 81 mg by mouth daily with lunch.    [name] azelastine (ASTELIN) 0.1 % nasal  spray Place 2 sprays into both nostrils 2 (two) times daily. 30 mL 2  . calcium carbonate (TUMS EX) 750 MG chewable tablet Chew 1 tablet by mouth daily as needed for heartburn.     . cholecalciferol (VITAMIN D) 1000 UNITS tablet Take 2,000 Units by mouth daily.    [name] EPIPEN 2-PAK 0.3 MG/0.3ML SOAJ injection Inject 0.3 mLs (0.3 mg total) into the muscle once. 2 Device 3  . fexofenadine (ALLEGRA) 180 MG tablet Take 90 mg by mouth 2 (two) times daily.     . fluticasone (FLONASE) 50 MCG/ACT nasal spray Place 2 sprays into both nostrils daily. 16 g 3  . metoprolol tartrate (LOPRESSOR) 25 MG tablet TAKE 1 TABLET BY MOUTH 3 TIMES DAILY 270 tablet 1  . montelukast (SINGULAIR) 10 MG tablet Take 5 mg by mouth 2 (two) times daily.    . Multiple Vitamin (MULTIVITAMIN WITH MINERALS) TABS Take 1 tablet by mouth daily.    [name] omega-3 acid ethyl esters (LOVAZA) 1 G capsule Take 1 capsule (1 g total) by mouth 2 (two) times daily. 180 capsule 3  . OVER THE COUNTER MEDICATION Apply 1 application topically daily as needed (for acne). Oxy 10 vanishing cream    . polyethylene glycol powder (GLYCOLAX/MIRALAX) powder MIX 17G WITH LIQUID AND DRINK BY MOUTH DAILY. 527 g 5  . predniSONE (DELTASONE) 10 MG tablet TAKE 2 TABLETS BY MOUTH DAILY AS NEEDED FOR ASTHMA FLARE-UP 30 tablet 0  . rosuvastatin (CRESTOR) 10 MG tablet Take 1 tablet (10 mg total) by mouth daily. 30 tablet 0  . thiamine (VITAMIN B-1) 100 MG tablet Take 100 mg by mouth 2 (two) times daily at 10 AM and 5 PM.     . vitamin B-12 (CYANOCOBALAMIN) 500 MCG tablet Take 500-1,000 mcg by mouth daily.     . [DISCONTINUED] oxyCODONE (OXY IR/ROXICODONE) 5 MG immediate release tablet Take 1 tablet (5 mg total) by mouth every 4 (four) hours as needed. 30 tablet 0   No current facility-administered medications for this visit.    Allergies  Allergen Reactions  . Testosterone Shortness Of Breath    Medication:Androgel Pt states trouble breathing at night  . Codeine  Nausea And Vomiting  . Gabapentin     "drunk" feeling  . Ultram [Tramadol] Nausea And Vomiting  . Albuterol Other (See Comments)    Panic attack    Review of Systems  Weight stable No night sweats or fever Patient ambulates 20 minutes daily without chest pain or shortness of breath He remains tobacco free No pedal edema No abdominal pain or back pain No hematuria No cough or hemoptysis No  falls or syncope  BP 130/74 mmHg  Pulse 60  Resp 16  Ht 6\' 4"  (1.93 m)  Wt 237 lb (107.502 kg)  BMI 28.86 kg/m2  SpO2 97% Physical Exam      Physical Exam  General: overweight but healthy-appearing middle-aged Caucasian male no distress HEENT: Normocephalic pupils equal , dentition adequate Neck: Supple without JVD, adenopathy, or bruit Chest: Clear to auscultation, symmetrical breath sounds, no rhonchi, no tenderness             or deformity. Prior sternal incision well-healed. Cardiovascular: Regular rate and rhythm, soft systolic flow murmur , no gallop, peripheral pulses             palpable in all extremities Abdomen:  Soft, nontender, no palpable mass or organomegaly Extremities: Warm, well-perfused, no clubbing cyanosis edema or tenderness,              no venous stasis changes of the legs Rectal/GU: Deferred Neuro: Grossly non--focal and symmetrical throughout Skin: Clean and dry without rash or ulceration   Diagnostic Tests: CTS thoracic aorta is independently reviewed. There is no residual false lumen with an intact hemi-arch repair without false aneurysm. The diameter of the thoracic aorta is 4.1 cm at maximum.the great vessels of the arch are widely patent  Impression: Stable repair of type A ascending thoracic dissection with resuspension of aortic valve No evidence of recurrent aortic insufficiency Patient is asymptomatic with well-controlled blood pressure, lipid  control, and nonsmoking  Plan:continue with surveillance scans coordinated with the patient's  vascular surgeon Dr. Kellie Simmering in one year.   Len Childs, MD [facility] [phone]

## 2015-04-28 ENCOUNTER — Other Ambulatory Visit: Payer: Self-pay | Admitting: Internal Medicine

## 2015-04-28 ENCOUNTER — Ambulatory Visit
Admission: RE | Admit: 2015-04-28 | Discharge: 2015-04-28 | Disposition: A | Discharge status: Home / Self Care | Payer: 59 | Source: Ambulatory Visit | Attending: Internal Medicine | Admitting: Internal Medicine

## 2015-04-28 DIAGNOSIS — E041 Nontoxic single thyroid nodule: Secondary | ICD-10-CM

## 2015-04-30 ENCOUNTER — Telehealth: Payer: Self-pay | Admitting: Internal Medicine

## 2015-04-30 NOTE — Telephone Encounter (Signed)
Confirmed that pt did see his results via my chart. Pt stated that he was concerned about the findings and needing a biopsy. Assured him that it is normal to feel that way but without concrete information either way he should try to stay calm and approach the unknown with as much positivity as possible. Pt asked about Dr. Lesle Chris since he was recommended to see her. I told him that she is an excellent choice to help him through this journey of his life.   Offered the pt additional assistance now and in the future.

## 2015-04-30 NOTE — Telephone Encounter (Signed)
Pt called back regarding his ultra sound results. Please call him at [phone]

## 2015-05-05 ENCOUNTER — Other Ambulatory Visit: Payer: Self-pay | Admitting: Family

## 2015-05-14 ENCOUNTER — Other Ambulatory Visit: Payer: Self-pay | Admitting: Internal Medicine

## 2015-05-21 ENCOUNTER — Ambulatory Visit (INDEPENDENT_AMBULATORY_CARE_PROVIDER_SITE_OTHER): Payer: 59 | Admitting: Internal Medicine

## 2015-05-21 ENCOUNTER — Encounter: Payer: Self-pay | Admitting: Internal Medicine

## 2015-05-21 VITALS — BP 160/68 | HR 59 | Temp 97.6°F | Wt 238.9 lb

## 2015-05-21 DIAGNOSIS — L739 Follicular disorder, unspecified: Secondary | ICD-10-CM | POA: Insufficient documentation

## 2015-05-21 MED ORDER — SULFAMETHOXAZOLE-TRIMETHOPRIM 800-160 MG PO TABS: 1.0000 | ORAL_TABLET | Freq: Two times a day (BID) | ORAL | Status: DC

## 2015-05-21 NOTE — Progress Notes (Signed)
Patient ID: [name], male   DOB: 1950/10/27, 64 y.o.   MRN: [id number]   Subjective:   Patient ID: [name] male   DOB: Jun 12, 1951 64 y.o.   MRN: [id number]  HPI: [name] is a 64 y.o. pleasant gentleman with PMH of HTN, HLD, T2DM, COPD, OA, GERD, and Type A ascending aortic dissection s/p repair in 2013 who presents with complaint of a new skin lesion. He noticed a sore spot on his right chest this morning. He felt a lump that was warm and mildly tender/painful to touch, 2-3/10 in severity, with a red appearance. He thought it looked like a boil, which he has previously had during his childhood. He cannot recall any injury/trauma or insect/spider bite to the area. He has not noticed any discharge. He is concerned about possible infectious process due to his cardiac history. He will be having a biopsy tomorrow for a thyroid nodule which was seen incidentally on f/u CTA of his thoracic aorta. He will also see his dentist in 2 weeks for a bridge which he will be taking oral Amoxicillin. Please see problem list for status of patient's chronic medical issues.    Past Medical History  Diagnosis Date  . ERECTILE DYSFUNCTION, ORGANIC   . Unspecified vitamin D deficiency   . HYPERLIPIDEMIA   . ALLERGIC RHINITIS   . Dissecting aortic aneurysm, thoracic ([place]) 02/20/2012    s/p emergent surgical repair  . Anxiety   . OBSTRUCTIVE SLEEP APNEA 02/2008 sleep study    moderate & central- not able to tolerate CPAP   . GERD (gastroesophageal reflux disease)     related to diet & anxiety   . COPD (chronic obstructive pulmonary disease) ([place])     also states he has been told that he has slight emphysema   . Arthritis     OA, hands, - thumbs- specifically  . DVT (deep venous thrombosis) ([facility])     coumadin- x3 months , post op  . Anemia     post op, treated /w Fe  . Hypertension     pt. followwed by Dr. Johnsie Cancel  . MIGRAINE HEADACHE     induced from physical , emotional stress ,  but sometimes occur randomly, also experiences BPPV  . Brachial plexopathy     double crush injury with median nerve damage on the right dominant hand.   [name] DM2 (diabetes mellitus, type 2) ([place])   . AAA (abdominal aortic aneurysm) ([place])   . Thyroid nodule 04/21/2015    2.3 cm on right, incidental finding on 03/2015 CT chest - f/u US ordered   Current Outpatient Prescriptions  Medication Sig Dispense Refill  . ALPRAZolam (XANAX) 1 MG tablet TAKE 1/2 TABLET BY MOUTH FOUR TIMES DAILY 60 tablet 0  . aspirin EC 81 MG tablet Take 81 mg by mouth daily with lunch.    [name] azelastine (ASTELIN) 0.1 % nasal spray Place 2 sprays into both nostrils 2 (two) times daily. 30 mL 2  . calcium carbonate (TUMS EX) 750 MG chewable tablet Chew 1 tablet by mouth daily as needed for heartburn.     . cholecalciferol (VITAMIN D) 1000 UNITS tablet Take 2,000 Units by mouth daily.    [name] EPIPEN 2-PAK 0.3 MG/0.3ML SOAJ injection Inject 0.3 mLs (0.3 mg total) into the muscle once. 2 Device 3  . fexofenadine (ALLEGRA) 180 MG tablet Take 90 mg by mouth 2 (two) times daily.     . fluticasone (FLONASE) 50 MCG/ACT nasal  spray Place 2 sprays into both nostrils daily. 16 g 3  . metoprolol tartrate (LOPRESSOR) 25 MG tablet TAKE 1 TABLET BY MOUTH 3 TIMES DAILY 270 tablet 1  . montelukast (SINGULAIR) 10 MG tablet Take 5 mg by mouth 2 (two) times daily.    . montelukast (SINGULAIR) 10 MG tablet Take 1 tablet (10 mg total) by mouth daily. 90 tablet 1  . Multiple Vitamin (MULTIVITAMIN WITH MINERALS) TABS Take 1 tablet by mouth daily.    [name] omega-3 acid ethyl esters (LOVAZA) 1 G capsule Take 1 capsule (1 g total) by mouth 2 (two) times daily. 180 capsule 3  . OVER THE COUNTER MEDICATION Apply 1 application topically daily as needed (for acne). Oxy 10 vanishing cream    . polyethylene glycol powder (GLYCOLAX/MIRALAX) powder MIX 17G WITH LIQUID AND DRINK BY MOUTH DAILY. 527 g 5  . predniSONE (DELTASONE) 10 MG tablet TAKE 2 TABLETS BY MOUTH  DAILY AS NEEDED FOR ASTHMA FLARE-UP 30 tablet 0  . rosuvastatin (CRESTOR) 10 MG tablet Take 1 tablet (10 mg total) by mouth daily. 30 tablet 0  . sulfamethoxazole-trimethoprim (BACTRIM DS,SEPTRA DS) 800-160 MG tablet Take 1 tablet by mouth 2 (two) times daily. 14 tablet 0  . thiamine (VITAMIN B-1) 100 MG tablet Take 100 mg by mouth 2 (two) times daily at 10 AM and 5 PM.     . vitamin B-12 (CYANOCOBALAMIN) 500 MCG tablet Take 500-1,000 mcg by mouth daily.     . [DISCONTINUED] oxyCODONE (OXY IR/ROXICODONE) 5 MG immediate release tablet Take 1 tablet (5 mg total) by mouth every 4 (four) hours as needed. 30 tablet 0   No current facility-administered medications for this visit.   Family History  Problem Relation Age of Onset  . Diabetes Mother   . Heart disease Maternal Uncle   . Heart disease Paternal Uncle   . Heart disease Cousin   . Lung cancer Cousin     2 cousins  . Heart disease Maternal Grandmother   . Deep vein thrombosis Father     PE  . Varicose Veins Father   . Stroke Mother   . Dementia Mother    Social History   Social History  . Marital Status: Divorced    Spouse Name: N/A  . Number of Children: 2  . Years of Education: acad. deg.   Occupational History  . internal med resident [place] History Main Topics  . Smoking status: Former Smoker -- 0.10 packs/day    Types: Cigarettes    Quit date: 02/18/2012  . Smokeless tobacco: Never Used     Comment: 10 cigs/day, quit 02/2012 after TAA emergent repair  . Alcohol Use: 0.0 oz/week    0 Standard drinks or equivalent per week     Comment: shot of Tequila every day   . Drug Use: No  . Sexual Activity: Not Asked   Other Topics Concern  . None   Social History Narrative   Patient is single and lives at home alone.    Arboriculturist.   Patient works at [employer] full time.   Right handed.   Caffeine one cup of coffee daily.    Review of Systems: Review of Systems  Constitutional: Negative for  fever and chills.  Respiratory: Negative for shortness of breath.   Cardiovascular: Negative for chest pain, palpitations and leg swelling.  Gastrointestinal: Negative for nausea, vomiting and abdominal pain.  Musculoskeletal: Negative for myalgias.  Skin: Positive for rash. Negative  for itching.    Objective:  Physical Exam: Filed Vitals:   05/21/15 1406  BP: 160/68  Pulse: 59  Temp: 97.6 F (36.4 C)  TempSrc: Oral  Weight: 238 lb 14.4 oz (108.364 kg)  SpO2: 100%   Physical Exam  Constitutional: He is oriented to person, place, and time. He appears well-developed and well-nourished. No distress.  HENT:  Head: Normocephalic and atraumatic.  Neck: Neck supple.  Cardiovascular: Normal rate and regular rhythm.   Systolic murmur heard best a upper sternal borders  Pulmonary/Chest: Effort normal and breath sounds normal. He has no wheezes. He has no rales.  Abdominal: Soft. There is no tenderness.  Musculoskeletal: Normal range of motion. He exhibits no edema or tenderness.  Lymphadenopathy:    He has no cervical adenopathy.  Neurological: He is alert and oriented to person, place, and time.  Skin:  Erythematous, blanching skin lesion at superolateral border of right nipple. Small nodule felt beneath superficial skin, more pronounced at hair follicle. Not warm to touch, no drainage or pus. Minimally tender to palpation.    Assessment & Plan:  Please see problem list for assessment and plan.

## 2015-05-21 NOTE — Assessment & Plan Note (Signed)
Patient with erythematous, blanching skin lesion at superolateral border of right nipple. Small nodule felt beneath superficial skin, more pronounced at hair follicle. Not warm to touch, no drainage or pus. Minimally tender to palpation. Appears to be a folliculitis with some involvement beneath superficial skin. Will treat with 1 week of antibiotics to cover for Staph and Strep which is worrisome with his cardiac history and to prevent formation of an abscess. He is also advised to use warm compresses throughout the day and call us if no improvement or worsening of symptoms after antibiotic course. As for his thyroid biopsy tomorrow, there is no indication for prophylactic antibiotics, but he will be covered regardless with the Bactrim we are giving him. -Bactrim DS BID for 7 days -Warm compresses -Call us for worsening of symptoms or side effect to antibiotics

## 2015-05-21 NOTE — Patient Instructions (Signed)
I have sent a prescription for the antibiotic Bactrim to your pharmacy. Please take this twice a day for 7 days. If you should have any side effects, please call us.  Please also use warm compresses daily to help with improvement of your folliculitis.  Folliculitis Folliculitis is redness, soreness, and swelling (inflammation) of the hair follicles. This condition can occur anywhere on the body. People with weakened immune systems, diabetes, or obesity have a greater risk of getting folliculitis. CAUSES  Bacterial infection. This is the most common cause.  Fungal infection.  Viral infection.  Contact with certain chemicals, especially oils and tars. Long-term folliculitis can result from bacteria that live in the nostrils. The bacteria may trigger multiple outbreaks of folliculitis over time. SYMPTOMS Folliculitis most commonly occurs on the scalp, thighs, legs, back, buttocks, and areas where hair is shaved frequently. An early sign of folliculitis is a small, white or yellow, pus-filled, itchy lesion (pustule). These lesions appear on a red, inflamed follicle. They are usually less than 0.2 inches (5 mm) wide. When there is an infection of the follicle that goes deeper, it becomes a boil or furuncle. A group of closely packed boils creates a larger lesion (carbuncle). Carbuncles tend to occur in hairy, sweaty areas of the body. DIAGNOSIS  Your caregiver can usually tell what is wrong by doing a physical exam. A sample may be taken from one of the lesions and tested in a lab. This can help determine what is causing your folliculitis. TREATMENT  Treatment may include:  Applying warm compresses to the affected areas.  Taking antibiotic medicines orally or applying them to the skin.  Draining the lesions if they contain a large amount of pus or fluid.  Laser hair removal for cases of long-lasting folliculitis. This helps to prevent regrowth of the hair. HOME CARE INSTRUCTIONS  Apply warm  compresses to the affected areas as directed by your caregiver.  If antibiotics are prescribed, take them as directed. Finish them even if you start to feel better.  You may take over-the-counter medicines to relieve itching.  Do not shave irritated skin.  Follow up with your caregiver as directed. SEEK IMMEDIATE MEDICAL CARE IF:   You have increasing redness, swelling, or pain in the affected area.  You have a fever. MAKE SURE YOU:  Understand these instructions.  Will watch your condition.  Will get help right away if you are not doing well or get worse.   This information is not intended to replace advice given to you by your health care provider. Make sure you discuss any questions you have with your health care provider.   Document Released: 08/09/2001 Document Revised: 06/21/2014 Document Reviewed: 08/31/2011 Elsevier Interactive Patient Education [employer].

## 2015-05-22 ENCOUNTER — Encounter: Payer: Self-pay | Admitting: Internal Medicine

## 2015-05-22 ENCOUNTER — Ambulatory Visit (INDEPENDENT_AMBULATORY_CARE_PROVIDER_SITE_OTHER): Payer: 59 | Admitting: Internal Medicine

## 2015-05-22 VITALS — BP 104/62 | HR 62 | Temp 97.6°F | Resp 12 | Ht 76.0 in | Wt 237.0 lb

## 2015-05-22 DIAGNOSIS — E041 Nontoxic single thyroid nodule: Secondary | ICD-10-CM

## 2015-05-22 LAB — TSH: TSH: 1.02 u[IU]/mL (ref 0.35–4.50)

## 2015-05-22 LAB — T3, FREE: T3, Free: 3.3 pg/mL (ref 2.3–4.2)

## 2015-05-22 LAB — T4, FREE: Free T4: 0.76 ng/dL (ref 0.60–1.60)

## 2015-05-22 NOTE — Progress Notes (Addendum)
Patient ID: [name], male   DOB: May 06, 1951, 64 y.o.   MRN: [id number]   HPI  [name] is a 64 y.o.-year-old male, referred by his PCP, Dr. Asa Lente, for evaluation for thyroid nodule.  He was found to have a thyroid nodule in 04/08/2015, when he had a CT angiogram of the chest for a history of type B thoracic aortic dissection. Thyroid ultrasound was recommended at that time. Looking back on his previous CT angiograms, the thyroid nodule was present at least since 2014  Thyroid U/S (04/28/2015): Solitary 1.6 x 1.6 x 1.4 cm solid nodule, superior pole, without microcalcifications.    Pt denies: - feeling nodules in neck - dysphagia - SOB with lying down He has: - + more choking over time, mostly at night  - + hoarseness (unclear if from postnasal drip)  I reviewed pt's thyroid tests: Lab Results  Component Value Date   TSH 1.18 07/12/2014   TSH 0.73 03/01/2013   TSH 0.95 02/16/2012   TSH 0.76 02/11/2011   TSH 1.15 01/02/2010   TSH 1.05 12/17/2008   TSH 1.260 06/20/2008    No FH of thyroid ds. No FH of thyroid cancer. No h/o radiation tx to head or neck.  No seaweed or kelp. No recent contrast studies. + Prednisone 2.5 mg daily in allergy season. No herbal supplements. On Biotin supplements (B complex) - occasionally - not in 1 week. + PN from surgery.   Pt also has a history of AAA repaired in 10/2014. On Bactrim for folliculitis. On Lovaza for OA >> working well. He also has a history of hyperlipidemia, hypertension, diabetes and anxiety.  ROS: Constitutional: no weight gain/loss, + fatigue, no subjective hyperthermia/hypothermia, + nocturia, + poor sleep Eyes: no blurry vision, no xerophthalmia ENT: no sore throat, no nodules palpated in throat, no dysphagia/odynophagia, + hoarseness Cardiovascular: no CP/SOB/palpitations/leg swelling Respiratory: no cough/SOB Gastrointestinal: + occ. N/no V/D/C Musculoskeletal: + muscle aches/no joint aches Skin: no  rashes Neurological: no tremors/+ numbness (neuropathy)/tingling/dizziness, + occular migraines Psychiatric: no depression/+ anxiety  Past Medical History  Diagnosis Date  . ERECTILE DYSFUNCTION, ORGANIC   . Unspecified vitamin D deficiency   . HYPERLIPIDEMIA   . ALLERGIC RHINITIS   . Dissecting aortic aneurysm, thoracic ([place]) 02/20/2012    s/p emergent surgical repair  . Anxiety   . OBSTRUCTIVE SLEEP APNEA 02/2008 sleep study    moderate & central- not able to tolerate CPAP   . GERD (gastroesophageal reflux disease)     related to diet & anxiety   . COPD (chronic obstructive pulmonary disease) ([place])     also states he has been told that he has slight emphysema   . Arthritis     OA, hands, - thumbs- specifically  . DVT (deep venous thrombosis) ([facility])     coumadin- x3 months , post op  . Anemia     post op, treated /w Fe  . Hypertension     pt. followwed by Dr. Johnsie Cancel  . MIGRAINE HEADACHE     induced from physical , emotional stress , but sometimes occur randomly, also experiences BPPV  . Brachial plexopathy     double crush injury with median nerve damage on the right dominant hand.   [name] DM2 (diabetes mellitus, type 2) ([place])   . AAA (abdominal aortic aneurysm) ([place])   . Thyroid nodule 04/21/2015    2.3 cm on right, incidental finding on 03/2015 CT chest - f/u US ordered   Past Surgical History  Procedure Laterality Date  . Nasal fx without repair    . Thoracic aortic aneurysm repair  02/20/2012    Procedure: THORACIC ASCENDING ANEURYSM REPAIR (AAA);  Surgeon: Ivin Poot, MD;  Location: [facility];  Service: Open Heart Surgery;  Laterality: N/A;  . Abdominal aortic aneurysm repair N/A 11/01/2012    Procedure: ANEURYSM ABDOMINAL AORTIC REPAIR;  Surgeon: Mal Misty, MD;  Location[facility];  Service: Vascular;  Laterality: N/A;  Resection and Grafting of Abdominal Aortic Aneurysm,(AORTA BI ILIAC)   Social History   Social History  . Marital Status: Divorced    Spouse Name: N/A    . Number of Children: 2  . Years of Education: acad. deg.   Occupational History  . [place] History Main Topics  . Smoking status: Former Smoker -- 0.10 packs/day    Types: Cigarettes    Quit date: 02/18/2012  . Smokeless tobacco: Never Used     Comment: 10 cigs/day, quit 02/2012 after TAA emergent repair  . Alcohol Use: 0.0 oz/week    0 Standard drinks or equivalent per week     Comment: shot of Tequila every day per med advice  . Drug Use: No   Social History Narrative   Patient is single and lives at home alone.    Arboriculturist.   Patient works at [employer] full time.   Right handed.   Caffeine one cup of coffee daily.    Current Outpatient Prescriptions on File Prior to Visit  Medication Sig Dispense Refill  . ALPRAZolam (XANAX) 1 MG tablet TAKE 1/2 TABLET BY MOUTH FOUR TIMES DAILY 60 tablet 0  . aspirin EC 81 MG tablet Take 81 mg by mouth daily with lunch.    [name] azelastine (ASTELIN) 0.1 % nasal spray Place 2 sprays into both nostrils 2 (two) times daily. 30 mL 2  . calcium carbonate (TUMS EX) 750 MG chewable tablet Chew 1 tablet by mouth daily as needed for heartburn.     . cholecalciferol (VITAMIN D) 1000 UNITS tablet Take 2,000 Units by mouth daily.    [name] EPIPEN 2-PAK 0.3 MG/0.3ML SOAJ injection Inject 0.3 mLs (0.3 mg total) into the muscle once. 2 Device 3  . fexofenadine (ALLEGRA) 180 MG tablet Take 90 mg by mouth 2 (two) times daily.     . fluticasone (FLONASE) 50 MCG/ACT nasal spray Place 2 sprays into both nostrils daily. 16 g 3  . metoprolol tartrate (LOPRESSOR) 25 MG tablet TAKE 1 TABLET BY MOUTH 3 TIMES DAILY 270 tablet 1  . montelukast (SINGULAIR) 10 MG tablet Take 5 mg by mouth 2 (two) times daily.    . Multiple Vitamin (MULTIVITAMIN WITH MINERALS) TABS Take 1 tablet by mouth daily.    [name] omega-3 acid ethyl esters (LOVAZA) 1 G capsule Take 1 capsule (1 g total) by mouth 2 (two) times daily. 180 capsule 3  . OVER THE COUNTER  MEDICATION Apply 1 application topically daily as needed (for acne). Oxy 10 vanishing cream    . polyethylene glycol powder (GLYCOLAX/MIRALAX) powder MIX 17G WITH LIQUID AND DRINK BY MOUTH DAILY. 527 g 5  . rosuvastatin (CRESTOR) 10 MG tablet Take 1 tablet (10 mg total) by mouth daily. 30 tablet 0  . sulfamethoxazole-trimethoprim (BACTRIM DS,SEPTRA DS) 800-160 MG tablet Take 1 tablet by mouth 2 (two) times daily. 14 tablet 0  . thiamine (VITAMIN B-1) 100 MG tablet Take 100 mg by mouth 2 (two) times daily at 10 AM  and 5 PM.     . vitamin B-12 (CYANOCOBALAMIN) 500 MCG tablet Take 500-1,000 mcg by mouth daily.     . [DISCONTINUED] oxyCODONE (OXY IR/ROXICODONE) 5 MG immediate release tablet Take 1 tablet (5 mg total) by mouth every 4 (four) hours as needed. 30 tablet 0   No current facility-administered medications on file prior to visit.   Allergies  Allergen Reactions  . Testosterone Shortness Of Breath    Medication:Androgel Pt states trouble breathing at night  . Codeine Nausea And Vomiting  . Gabapentin     "drunk" feeling  . Ultram [Tramadol] Nausea And Vomiting  . Albuterol Other (See Comments)    Panic attack   Family History  Problem Relation Age of Onset  . Diabetes Mother   . Heart disease Maternal Uncle   . Heart disease Paternal Uncle   . Heart disease Cousin   . Lung cancer Cousin     2 cousins  . Heart disease Maternal Grandmother   . Deep vein thrombosis Father     PE  . Varicose Veins Father   . Stroke Mother   . Dementia Mother    PE: BP 104/62 mmHg  Pulse 62  Temp(Src) 97.6 F (36.4 C) (Oral)  Resp 12  Ht 6\' 4"  (1.93 m)  Wt 237 lb (107.502 kg)  BMI 28.86 kg/m2  SpO2 97% Wt Readings from Last 3 Encounters:  05/22/15 237 lb (107.502 kg)  05/21/15 238 lb 14.4 oz (108.364 kg)  04/24/15 237 lb (107.502 kg)   Constitutional: overweight, in NAD Eyes: PERRLA, EOMI, no exophthalmos ENT: moist mucous membranes, no thyromegaly, no thyroid nodules palpated, no  cervical lymphadenopathy Cardiovascular: RRR, + 3/6 SEM, no RG Respiratory: CTA B Gastrointestinal: abdomen soft, NT, ND, BS+ Musculoskeletal: no deformities, strength intact in all 4;  Skin: moist, warm, no rashes Neurological: no tremor with outstretched hands, DTR normal in all 4  ASSESSMENT: 1. Thyroid nodule  PLAN: 1. Thyroid nodule - I reviewed the images of his thyroid ultrasound along with the patient. I pointed out that, because he is a man, he is at higher risk for [facility]. Otherwise, the nodule is:  - not very large (medium-sized) - mostly isoechoic with a hypoechoic rim - without microcalcifications - without internal blood flow - in some views, it is more tall then wide - well delimited from surrounding tissue Pt does not have a thyroid cancer family history or a personal history of RxTx to head/neck >>  these would favor benignity.  - the only way that we can tell exactly if it is cancer or not is by doing a thyroid biopsy (FNA). I explained what the test entails. - We discussed about other options, to wait for another 6 months to a year and see if the nodule grows, and only intervene at that time.  - patient decided to have the FNA done now >> I ordered this.  - We discussed about the fact that the FNA results could be: Benign, malignant, and inconclusive. We discussed about a plan of action in each of the above situations. - I explained that this is not cancer, we can continue to follow him on a yearly basis, and check another ultrasound in another year or 2. - We discussed about the indolent nature of the most common types of thyroid cancer, also about the selective need for radioactive iodine treatment after the surgery, the purpose of their RAI treatment, follow-up of the thyroid cancer with use of thyroglobulin (tumor  marker), possible metastatic sites. - I did explain that, while thyroid surgery is not a complicated one, it still can have side effects  - He is a higher  risk for surgery did to his history of AAA. He is not on blood thinners. - I'll see him back in a year, assuming her FNA is normal. If FNA abnormal, we will meet sooner.  - We will check TFTs today, since his last set were from almost a year ago  - time spent with the patient: 1 hour, of which >50% was spent in obtaining information about his thyroid nodule, reviewing his previous labs, evaluations, and treatments, counseling him about his condition (please see the discussed topics above), and developing a plan to further investigate it; he had a number of questions which I addressed.  Orders Placed This Encounter  Procedures  . US Thyroid Biopsy  . T3, free  . T4, free  . TSH   CC:  Dr. Jenkins Rouge Dr. Tharon Aquas Tright Dr. Delma Officer Dr. Tinnie Gens  Office Visit on 05/22/2015  Component Date Value Ref Range Status  . T3, Free 05/22/2015 3.3  2.3 - 4.2 pg/mL Final  . Free T4 05/22/2015 0.76  0.60 - 1.60 ng/dL Final  . TSH 05/22/2015 1.02  0.35 - 4.50 uIU/mL Final  TFTs normal.  Adequacy Reason Satisfactory For Evaluation. Diagnosis THYROID, RIGHT UPPER POLE, FINE NEEDLE ASPIRATION (SPECIMEN 1 OF 1 COLLECTED 07-17-2015): FINDINGS CONSISTENT WITH A HURTHLE CELL LESION AND/OR NEOPLASM (BETHESDA CATEGORY IV). Willeen Niece MD Pathologist, Electronic Signature (Case signed 07/21/2015) Specimen Clinical Information Solitary 16 x 16 x 14 mm solid nodule, Superior pole, Without microcalcifications Source Thyroid, Fine Needle Aspiration, Rt Lobe RUP Superior, (Specimen 1 of 1, collected on 07/17/15 )  With this result, risk of cancer is 15-30%. The [place] recommendation is thyroid lobectomy. I called and discussed with him. He will let me know if he decides for surgery.

## 2015-05-22 NOTE — Patient Instructions (Signed)
Please stop at the lab.  You will be called to schedule the thyroid biopsy.  Please return in 1 year.    Thyroid Biopsy The thyroid gland is a butterfly-shaped gland located in the front of the neck. It produces hormones that affect metabolism, growth and development, and body temperature. Thyroid biopsy is a procedure in which small samples of tissue or fluid are removed from the thyroid gland. The samples are then looked at under a microscope to check for abnormalities. This procedure is done to determine the cause of thyroid problems. It may be done to check for infection, cancer, or other thyroid problems. Two methods may be used for a thyroid biopsy. In one method, a thin needle is inserted through the skin and into the thyroid gland. In the other method, an open incision is made through the skin. LET [facility] CARE PROVIDER KNOW ABOUT:   Any allergies you have.  All medicines you are taking, including vitamins, herbs, eye drops, creams, and over-the-counter medicines.  Previous problems you or members of your family have had with the use of anesthetics.  Any blood disorders you have.  Previous surgeries you have had.  Medical conditions you have. RISKS AND COMPLICATIONS Generally, this is a safe procedure. However, problems can occur and include:  Bleeding from the procedure site.  Infection.  Injury to structures near the thyroid gland. BEFORE THE PROCEDURE   Ask your health care provider about:  Changing or stopping your regular medicines. This is especially important if you are taking diabetes medicines or blood thinners.  Taking medicines such as aspirin and ibuprofen. These medicines can thin your blood. Do not take these medicines before your procedure if your health care provider asks you not to.  Do not eat or drink anything after midnight on the night before the procedure or as directed by your health care provider.  You may have a blood sample  taken. PROCEDURE Either of these methods may be used to perform a thyroid biopsy:  Fine needle biopsy. You may be given medicine to help you relax (sedative). You will be asked to lie on your back with your head tipped backward to extend your neck. An area on your neck will be cleaned. A needle will then be inserted through the skin of your neck. You may be asked to avoid coughing, talking, swallowing, or making sounds during some portions of the procedure. The needle will be withdrawn once the tissue or fluid samples have been removed. Pressure may be applied to your neck to reduce swelling and ensure that bleeding has stopped. The samples will be sent to a lab for examination.  Open biopsy. You will be given medicine to make you sleep (general anesthetic). An incision will be made in your neck. A sample of thyroid tissue will be removed using surgical tools. The tissue sample will be sent for examination. In some cases, the sample may be examined during the biopsy. If that is done and cancer cells are found, some or all of the thyroid gland may be removed. The incision will be closed with stitches. AFTER THE PROCEDURE   Your recovery will be assessed and monitored.  You may have soreness and tenderness at the site of the biopsy. This should go away after a few days.  If you had an open biopsy, you may have a hoarse voice or sore throat for a couple days.  It is your responsibility to get your test results.   This information is  not intended to replace advice given to you by your health care provider. Make sure you discuss any questions you have with your health care provider.   Document Released: 03/28/2007 Document Revised: 06/21/2014 Document Reviewed: 08/23/2013 Elsevier Interactive Patient Education [employer].

## 2015-05-29 ENCOUNTER — Other Ambulatory Visit: Payer: Self-pay | Admitting: Internal Medicine

## 2015-05-30 NOTE — Telephone Encounter (Signed)
Faxed to pharmacy

## 2015-06-02 NOTE — Progress Notes (Signed)
I saw and evaluated the patient.  I personally confirmed the key portions of Dr. Patel's history and exam and reviewed pertinent patient test results.  The assessment, diagnosis, and plan were formulated together and I agree with the documentation in the resident's note. 

## 2015-06-13 ENCOUNTER — Ambulatory Visit: Payer: 59 | Attending: Adult Health | Admitting: Psychology

## 2015-06-13 DIAGNOSIS — R410 Disorientation, unspecified: Secondary | ICD-10-CM | POA: Diagnosis not present

## 2015-06-13 DIAGNOSIS — F419 Anxiety disorder, unspecified: Secondary | ICD-10-CM | POA: Insufficient documentation

## 2015-06-13 DIAGNOSIS — R413 Other amnesia: Secondary | ICD-10-CM | POA: Insufficient documentation

## 2015-06-13 NOTE — Progress Notes (Signed)
[facility]  [address]   Telephone [phone] [address] Fax [phone] [place], [place] [address]  Initial Contact Note  Name:  [name] Date of Birth; 10-11-1950 MRN:  [id number] Date:  06/13/2015  [name] is an 64 y.o. male who was referred for neuropsychological evaluation by Vaughan Browner, NP of [facility] due to an aproximate one year history of confusional episodes and cognitive difficulties.   A total of 3 hours was spent today reviewing medical records, interviewing (CPT [phone]) [name] and administering and scoring neurocognitive tests (CPT [phone] & [phone]).  Preliminary Diagnostic Impressions: Confusion, episodic [R41.0] Memory difficulties [R41.3] Anxiety, mild [F41.9]  There were no concerns expressed or behaviors displayed by [name] that would require immediate attention.   A full report will follow once the planned testing has been completed. His next appointment is scheduled for 06/20/15.   Jamey Ripa, Ph.D Licensed Psychologist 06/13/2015

## 2015-06-16 ENCOUNTER — Emergency Department (HOSPITAL_COMMUNITY)
Admission: EM | Admit: 2015-06-16 | Discharge: 2015-06-16 | Disposition: A | Discharge status: Home / Self Care | Payer: 59 | Source: Home / Self Care | Attending: Internal Medicine | Admitting: Internal Medicine

## 2015-06-16 ENCOUNTER — Encounter (HOSPITAL_COMMUNITY): Payer: Self-pay | Admitting: Emergency Medicine

## 2015-06-16 DIAGNOSIS — M545 Low back pain: Secondary | ICD-10-CM

## 2015-06-16 LAB — POCT URINALYSIS DIP (DEVICE)
BILIRUBIN URINE: NEGATIVE
Glucose, UA: NEGATIVE mg/dL
HGB URINE DIPSTICK: NEGATIVE
Ketones, ur: NEGATIVE mg/dL
Leukocytes, UA: NEGATIVE
NITRITE: NEGATIVE
PH: 5.5 (ref 5.0–8.0)
Protein, ur: NEGATIVE mg/dL
Specific Gravity, Urine: >=1.03 (ref 1.005–1.030)
UROBILINOGEN UA: 0.2 mg/dL (ref 0.0–1.0)

## 2015-06-16 MED ORDER — HYDROCODONE-ACETAMINOPHEN 5-325 MG PO TABS: 1.0000 | ORAL_TABLET | ORAL | Status: DC | PRN

## 2015-06-16 MED ORDER — KETOROLAC TROMETHAMINE 60 MG/2ML IM SOLN
60.0000 mg | Freq: Once | INTRAMUSCULAR | Status: AC
  Administered 2015-06-16: 60 mg via INTRAMUSCULAR

## 2015-06-16 MED ORDER — KETOROLAC TROMETHAMINE 60 MG/2ML IM SOLN
INTRAMUSCULAR | Status: AC
  Filled 2015-06-16: qty 2

## 2015-06-16 MED ORDER — METAXALONE 800 MG PO TABS: 800.0000 mg | ORAL_TABLET | Freq: Three times a day (TID) | ORAL | Status: DC

## 2015-06-16 MED FILL — HYDROCODON-APAP 5-325: 5-325 | 2 days supply | Qty: 10 | Fill #0

## 2015-06-16 MED FILL — METAXALONE 800 MG TABLET: 800 | 7 days supply | Qty: 21 | Fill #0

## 2015-06-16 NOTE — Discharge Instructions (Signed)
A toradol injection was given today for pain in the back. Prescription for skelaxin (metaxolone, a muscle relaxer) was sent to the [facility] pharmacy. Prescription for small number (10 tablets) of vicodin was printed. Recheck or followup PCP for persistent symptoms; anticipate gradual improvement over the next several days.

## 2015-06-16 NOTE — ED Provider Notes (Signed)
CSN: [id number]     Arrival date & time 06/16/15  1310 History   First MD Initiated Contact with Patient 06/16/15 1438     Chief Complaint  Patient presents with  . Back Pain  . Hematuria   HPI  Patient is a 65 year old gentleman who presents today with the onset of pain 24 hours ago across his low back, radiating forwards into the bilateral lower quadrants and also into the bilateral upper buttocks.  He has some urinary hesitancy, decreased volumes. He has had no incontinence, and no change in bowel habits.  He has noted blood-tinged urine this morning, and actually intermittently for a couple of weeks.  He has a lot of pain in his back with change of position, especially moving from sitting to standing. He has spasms in his low back.  No fever. Some nausea when the pain is intense. He is able to walk with assistance into the urgent care.  He has foot neuropathy, that at times radiates into his calves.  History of a couple episodes of similar back pain, remotely, which responded to chiropractic care.  Since he had an aortic dissection repaired, he doesn't think he is a candidate for chiropractic care anymore.   Past Medical History  Diagnosis Date  . ERECTILE DYSFUNCTION, ORGANIC   . Unspecified vitamin D deficiency   . HYPERLIPIDEMIA   . ALLERGIC RHINITIS   . Dissecting aortic aneurysm, thoracic ([place]) 02/20/2012    s/p emergent surgical repair  . Anxiety   . OBSTRUCTIVE SLEEP APNEA 02/2008 sleep study    moderate & central- not able to tolerate CPAP   . GERD (gastroesophageal reflux disease)     related to diet & anxiety   . COPD (chronic obstructive pulmonary disease) ([place])     also states he has been told that he has slight emphysema   . Arthritis     OA, hands, - thumbs- specifically  . DVT (deep venous thrombosis) ([facility])     coumadin- x3 months , post op  . Anemia     post op, treated /w Fe  . Hypertension     pt. followwed by Dr. Johnsie Cancel  . MIGRAINE HEADACHE     induced  from physical , emotional stress , but sometimes occur randomly, also experiences BPPV  . Brachial plexopathy     double crush injury with median nerve damage on the right dominant hand.   [name] DM2 (diabetes mellitus, type 2) ([place])   . AAA (abdominal aortic aneurysm) ([place])   . Thyroid nodule 04/21/2015    2.3 cm on right, incidental finding on 03/2015 CT chest - f/u US ordered   Past Surgical History  Procedure Laterality Date  . Nasal fx without repair    . Thoracic aortic aneurysm repair  02/20/2012    Procedure: THORACIC ASCENDING ANEURYSM REPAIR (AAA);  Surgeon: Ivin Poot, MD;  Location: [facility];  Service: Open Heart Surgery;  Laterality: N/A;  . Abdominal aortic aneurysm repair N/A 11/01/2012    Procedure: ANEURYSM ABDOMINAL AORTIC REPAIR;  Surgeon: Mal Misty, MD;  Location:  [facility] OR;  Service: Vascular;  Laterality: N/A;  Resection and Grafting of Abdominal Aortic Aneurysm,(AORTA BI ILIAC)   Family History  Problem Relation Age of Onset  . Diabetes Mother   . Heart disease Maternal Uncle   . Heart disease Paternal Uncle   . Heart disease Cousin   . Lung cancer Cousin     2 cousins  . Heart disease Maternal Grandmother   .  Deep vein thrombosis Father     PE  . Varicose Veins Father   . Stroke Mother   . Dementia Mother    Social History  Substance Use Topics  . Smoking status: Former Smoker -- 0.10 packs/day    Types: Cigarettes    Quit date: 02/18/2012  . Smokeless tobacco: Never Used     Comment: 10 cigs/day, quit 02/2012 after TAA emergent repair  . Alcohol Use: 0.0 oz/week    0 Standard drinks or equivalent per week     Comment: shot of Tequila every day     Review of Systems  All other systems reviewed and are negative.   Allergies  Testosterone; Codeine; Gabapentin; Ultram; and Albuterol  Home Medications   Prior to Admission medications   Medication Sig Start Date End Date Taking? Authorizing Provider  ALPRAZolam Duanne Moron) 1 MG tablet TAKE 1/2 TABLET BY  MOUTH FOUR TIMES DAILY 05/29/15  Yes Hoyt Koch, MD  aspirin EC 81 MG tablet Take 81 mg by mouth daily with lunch.   Yes Historical Provider, MD  azelastine (ASTELIN) 0.1 % nasal spray Place 2 sprays into both nostrils 2 (two) times daily. 11/01/14  Yes Rowe Clack, MD  calcium carbonate (TUMS EX) 750 MG chewable tablet Chew 1 tablet by mouth daily as needed for heartburn.    Yes Historical Provider, MD  cholecalciferol (VITAMIN D) 1000 UNITS tablet Take 2,000 Units by mouth daily. 02/11/11  Yes Rowe Clack, MD  EPIPEN 2-PAK 0.3 MG/0.3ML SOAJ injection Inject 0.3 mLs (0.3 mg total) into the muscle once. 12/02/14  Yes Rowe Clack, MD  fexofenadine (ALLEGRA) 180 MG tablet Take 90 mg by mouth 2 (two) times daily.    Yes Historical Provider, MD  fluticasone (FLONASE) 50 MCG/ACT nasal spray Place 2 sprays into both nostrils daily. 11/01/14  Yes Rowe Clack, MD  metFORMIN (GLUCOPHAGE-XR) 500 MG 24 hr tablet Take 500 mg by mouth daily with supper.  05/05/15  Yes Historical Provider, MD  metoprolol tartrate (LOPRESSOR) 25 MG tablet TAKE 1 TABLET BY MOUTH 3 TIMES DAILY 02/21/15  Yes Josue Hector, MD  montelukast (SINGULAIR) 10 MG tablet Take 5 mg by mouth 2 (two) times daily.   Yes Historical Provider, MD  Multiple Vitamin (MULTIVITAMIN WITH MINERALS) TABS Take 1 tablet by mouth daily.   Yes Historical Provider, MD  omega-3 acid ethyl esters (LOVAZA) 1 G capsule Take 1 capsule (1 g total) by mouth 2 (two) times daily. 11/01/14  Yes Rowe Clack, MD  OVER THE COUNTER MEDICATION Apply 1 application topically daily as needed (for acne). Oxy 10 vanishing cream   Yes Historical Provider, MD  polyethylene glycol powder (GLYCOLAX/MIRALAX) powder MIX 17G WITH LIQUID AND DRINK BY MOUTH DAILY. 04/16/15  Yes Rowe Clack, MD  rosuvastatin (CRESTOR) 10 MG tablet Take 1 tablet (10 mg total) by mouth daily. 01/03/15  Yes Rowe Clack, MD  sulfamethoxazole-trimethoprim  (BACTRIM DS,SEPTRA DS) 800-160 MG tablet Take 1 tablet by mouth 2 (two) times daily. 05/21/15  Yes Zada Finders, MD  thiamine (VITAMIN B-1) 100 MG tablet Take 100 mg by mouth 2 (two) times daily at 10 AM and 5 PM.    Yes Historical Provider, MD  vitamin B-12 (CYANOCOBALAMIN) 500 MCG tablet Take 500-1,000 mcg by mouth daily.    Yes Historical Provider, MD   Meds Ordered and Administered this Visit   Medications  ketorolac (TORADOL) injection 60 mg (60 mg Intramuscular Given 06/16/15 1502)  BP 119/74 mmHg  Pulse 78  Temp(Src) 98.2 F (36.8 C) (Oral)  Resp 14  SpO2 100%   Physical Exam  Constitutional: He is oriented to person, place, and time.  Alert, nicely groomed Sitting on the edge of the exam table, looks tense, and episodically has back spasms that are very painful  HENT:  Head: Atraumatic.  Eyes:  Conjugate gaze, no eye redness/drainage  Neck: Neck supple.  Cardiovascular: Normal rate.   Pulmonary/Chest: No respiratory distress.  Abdominal: He exhibits no distension.  Musculoskeletal: Normal range of motion.  No midline posterior tenderness to percussion, does have left greater than right marked spasm of the paralumbar muscles, tender on the left  Neurological: He is alert and oriented to person, place, and time.  Walking slowly/painfully, difficulty appears to be due to pain rather than weakness. Able to walk independently to bathroom.  Skin: Skin is warm and dry.  No cyanosis  Nursing note and vitals reviewed.   ED Course  Procedures (including critical care time)  Results for orders placed or performed during the hospital encounter of 06/16/15  POCT urinalysis dip (device)  Result Value Ref Range   Glucose, UA NEGATIVE NEGATIVE mg/dL   Bilirubin Urine NEGATIVE NEGATIVE   Ketones, ur NEGATIVE NEGATIVE mg/dL   Specific Gravity, Urine >=1.030 1.005 - 1.030   Hgb urine dipstick NEGATIVE NEGATIVE   pH 5.5 5.0 - 8.0   Protein, ur NEGATIVE NEGATIVE mg/dL    Urobilinogen, UA 0.2 0.0 - 1.0 mg/dL   Nitrite NEGATIVE NEGATIVE   Leukocytes, UA NEGATIVE NEGATIVE     MDM   1. Bilateral low back pain, with sciatica presence unspecified    New Prescriptions       HYDROCODONE-ACETAMINOPHEN (NORCO/VICODIN) 5-325 MG TABLET    Take 1 tablet by mouth every 4 (four) hours as needed.   METAXALONE (SKELAXIN) 800 MG TABLET    Take 1 tablet (800 mg total) by mouth 3 (three) times daily.    Patient very concerned about cardiac risk for NSAIDs, so will give rx for very small number (10 tablets) of vicodin for pain relief.  Trial of muscle relaxer, massage. Recheck or FU PCP if not starting to improve in several days.     Sherlene Shams, MD 06/18/15 1101

## 2015-06-16 NOTE — ED Notes (Signed)
Patient c/o severe lower back pain onset yesterday. Patient reports that he was bending over with laundry and also picking up firewood. Patient is having difficulty ambulating. Patient also reports he noticied some blood in his urine this morning and is having some difficulty urination. Patient is in NAD.

## 2015-06-19 DIAGNOSIS — M5136 Other intervertebral disc degeneration, lumbar region: Secondary | ICD-10-CM | POA: Diagnosis not present

## 2015-06-19 DIAGNOSIS — M9905 Segmental and somatic dysfunction of pelvic region: Secondary | ICD-10-CM | POA: Diagnosis not present

## 2015-06-19 DIAGNOSIS — M9902 Segmental and somatic dysfunction of thoracic region: Secondary | ICD-10-CM | POA: Diagnosis not present

## 2015-06-19 DIAGNOSIS — M5137 Other intervertebral disc degeneration, lumbosacral region: Secondary | ICD-10-CM | POA: Diagnosis not present

## 2015-06-19 DIAGNOSIS — M4126 Other idiopathic scoliosis, lumbar region: Secondary | ICD-10-CM | POA: Diagnosis not present

## 2015-06-19 DIAGNOSIS — M9903 Segmental and somatic dysfunction of lumbar region: Secondary | ICD-10-CM | POA: Diagnosis not present

## 2015-06-19 DIAGNOSIS — M6240 Contracture of muscle, unspecified site: Secondary | ICD-10-CM | POA: Diagnosis not present

## 2015-06-20 ENCOUNTER — Ambulatory Visit: Payer: 59 | Attending: Adult Health | Admitting: Psychology

## 2015-06-20 DIAGNOSIS — R413 Other amnesia: Secondary | ICD-10-CM

## 2015-06-20 DIAGNOSIS — R41840 Attention and concentration deficit: Secondary | ICD-10-CM

## 2015-06-20 NOTE — Progress Notes (Addendum)
[facility]  [address]   Telephone [phone] [address] Fax [phone] [place], [place] [address]    NEUROPSYCHOLOGICAL EVALUATION  *CONFIDENTIAL* This report should not be released without the consent of the client  Name:   [name]. Adelson Date of Birth:  2050-07-04 [facility] MR#:  [id number] Dates of Evaluation: 06/13/15 & 06/20/15  Reason for Referral [name] is a 65 year old right-handed man with a recent history of unexplained transient confusional episodes and cognitive decline. A head CT on 04/02/12 did not show any acute intracranial abnormalities. He was referred for neuropsychological evaluation by Ryan Browner, NP of [facility] to assess his current cognitive and emotional functioning.  Sources of Information Electronic medical records from the [facility] were reviewed. [name] was interviewed.    Chief Complaints & Current Status  [name] reported that his cognitive functioning has declined within the past three years. He cited examples of misplacing things more frequently, his mind wandering in the midst of conversations and work meetings, decreased ability to comprehend what he has read, repeating what he had just said (according to co-workers), brief confusional behavior (e.g., picking up a television remote control instead of the telephone to make a call; getting got off an elevator at work on a familiar floor and not being sure which way to turn), having more trouble finding words while speaking, problems with spelling and being slower to make decisions. He did not cite any problems or changes with behavioral self-control, mood or everyday judgment.   He stated his belief that his cognitive difficulties began shortly after he underwent surgery to repair a dissecting aortic aneurysm in September 2013. (It was noted in medical records that he experienced post-operative bouts of hypoxia and  confusion). In addition, he reported that subsequent to that surgery he has experienced fatigue, right upper extremity numbness and weakness (diagnosed as brachial plexopathy), neuropathy in his legs and a several month period of despondent mood.  He was not aware of any other changes in his health, medication usage or social situation that was coincident with the onset of his symptoms. He reported that his sleep has improved within the past few months since he began taking alprazolam at bedtime.  More recently, he reported onset of discrete, transient confusional episodes beginning in fall 2015. He described himself as suddenly being unable to differentiate whether he is dreaming, remembering something from a past event or distorting current sensory input. For example, he was watching television when he saw a picture of what appeared to be his neighbor's car on television then remembered that he saw the car the previous day. In another episode, in the midst of putting fuel in his lawn mower car, he temporarily did not recognize what the fuel nozzle cap was that he was holding. He reported occasionally seeing what appears to be a vague image of an animal running across the floor. One time he thought he saw a monkey sitting on a man driving the car behind him. He denied having had auditory or somatic hallucinations. He denied having delusional thoughts.  He has often asked himself whether he just dreamed about something of did it really happen. He denied any concomitant loss of awareness of his surroundings or himself, or loss of consciousness. He later recalls details of his perceptions and thoughts from these episodes. He was not aware of any precipitants to the first two or three episodes though his last confusional episode was followed  by a migraine aura.   With regards to his emotional status, he described himself as typically an anxious person who "worries about everything". Of late, specific areas of worry  have included aging, his job performance and his health. He reported a reduction in his anxiety and improved sleep since he began taking alprazolam at bedtime in February 2016.   He reported that he continues to work, drive and live independently without major difficulties.   Background [name] lives alone. He was divorced in 2002. He has two adult daughters.   He has been employed by [employer] for the past thirty two years. 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   He reported that he earned a Bachelor's degree in Psychology and a Master's degree in [place], the latter in 1981. He reported no history of school-based attentional or learning problems.  His past medical history was notable for allergic rhinitis, arthritis (hands), brachial plexopathy (diagnosed after surgery of 2013), chronic obstructive pulmonary disease, Diabetes mellitus, Type 2, gastroesophageal reflux disease, hyperlipidemia, migraine headaches (six to eight per month on average) and obstructive sleep apnea (diagnosed via sleep study in 2009; unable to tolerate CPAP). He also underwent surgery for repair of an abdominal aortic aneurysm in May 2014. Recently, he was found to have a thyroid nodule in October 2016. A thyroid biopsy is pending.   He denied history of head injury, seizure activity, neurological infection or exposure to neurotoxic substances. He reported no history of alcohol abuse. He has been drinking one shot of tequila most every night for many years. He denied use illicit drugs. He stopped smoking after his AAA surgery in 2013.   He reported mental health contacts including marriage counseling about fifteen years ago and a few sessions with a psychologist in 2014 to address depressed mood. He reported no history of mood instability or suicidal behavior. He  has never been hospitalized for a psychiatric reason.   He reported no family history of dementia or psychiatric disorder.  His current medicatiion include alprazolam (as needed for sleep and/or anxiety), aspirin, azelastine, fexofenadine, fluticasone, metformin, metoprolol tartrate, montelukast, rosuvastatin and various vitamin supplements.  Evaluation Procedures In addition to review of medical records and clinical interviews, the following tests or questionnaires were administered:  Animal Naming Test Beck Depression Inventory-II  Boston Naming Test Controlled Oral Word Association Test Rey Complex Figure: copy Stroop Color and Word Test Trail Making A & B Wechsler Adult Intelligence Scale-IV: Music therapist, Coding & Digit Span   Wechsler Memory Scale-IV  Wide Range Achievement Test-4: Word Reading Zung Self-Rating Anxiety Scale  Test Results Observations, Validity & Interpretative Considerations He appeared as an appropriately dressed and groomed man. He did not display any unusual motor mannerisms. He interacted in a pleasant and somewhat anxious manner. His affect appeared within a wide range without signs of lability. No problems were evident for either verbal expression or understanding conversation. His thought processes were coherent and organized. There were no indications of loose associations or verbal perseverations. His thought content was devoid of bizarre or delusional ideas.  It was concluded that the test results represented a valid measure of his cognitive functioning. He did complain of back pain and intermittently grimaced in pain during the 06/20/15 session, however, though seemed to maintain his focus and persist to task. He did not report or display problems with vision (he wore  his eyeglasses), hearing or motor control.  He did not display signs of tremor or gross motor discoordination. For the most part, he had no apparent problems understanding task directions  though occasionally needed them to be repeated after an apparent attentional lapse. There were no signs of impulsive or perseverative responding. He made several self-deprecatory remarks about his performances on testing. He appeared to expend optimal effort.   His baseline intellectual potential was estimated to fall within the High Average range based on his educational and occupational background.   His test scores were corrected to reflect norms for his age and, whenever possible, his gender and educational level (i.e., 18 years). A listing of his test scores can be found at the end of this report.   Test Results & Interpretation His performances on brief timed tests that required focused visual attention and processing speed to transcribe symbols to match digits using a key (Wechsler Adult Intelligence Scale-IV (WAIS-IV) Coding) or to draw lines to connect numbers in sequence arrayed on a page (Trails A) fell within the Average range.   His performances on tests that required more complex attentional skills were mixed. On the one hand, his speed on a test that demanded complex visual sequencing and set shifting (Trails B) fell within the Average range. In contrast, he showed reduced efficiency on a test that required selective attention and response inhibition (Stroop Color and Word Test) in order to name the print color of a word while simultaneously ignoring the conflicting word.   Working memory is the attentional capacity to encode, hold and manipulate information within temporary memory. His auditory working memory was within normal expectations as he scored within the Average range on tests that required him to mentally rearrange digit sequences in reverse or ascending sequence (WAIS-IV Digit Span). In contrast, a composite measure of his visual working memory, based on his immediate recognition of symbols in left to right order (Wechsler Memory Scale-IV (WMS-IV) Symbol Span) and immediate  recall of spatial locations within a grid (WMS-IV Spatial Addition) fell at the lower boundary of the Low Average range.   His immediate memory was lower than expected. A composite measure of his ability to immediately recall auditory and visual information (WMS-IV Immediate Memory Index) fell within the Low Average range at the 19th percentile. He demonstrated a particular weakness for immediate recall of stories read to him as his score on that subtest fell at the 9th percentile. He reported that he had experienced a lapse of attention while one of the stories was read to him. He scored within the Average range on the other immediate auditory memory subtests that required learning of word pairs to multiple presentations, which differed from the single-trial learning of the story recall task. His immediate recall of designs and their spatial locations and of figural designs fell within the Average range.   A composite measure of delayed memory (WMS-IV Delayed Memory Index) fell within the Low Average range at the 12th percentile. His Delayed Memory Index was slightly lower than expected given his Immediate Memory Index, which was primarily due to his lower than expected delayed recall of designs and their spatial locations. His delayed recall of stories, word pairs and figural designs was as expected given his initial level of encoding. A recognition format using yes/no questions or multiple choices did not appreciably aid his delayed recall.   His ability to name to confrontation [facility] [employer]) was within the High Average range.  A measure of word  reading (Wide Range Achievement Test-4) fell within the Average range.    Measures of verbal fluency, which demanded ongoing self-monitoring and cognitive flexibility, were within the Average range based on his abilities to fluently generate words to letter prompts (Controlled Oral Word Association Test) or name animals Health and safety inspector Naming Test).     Visual  perceptual and visuospatial constructional skills were intact. There were no signs of spatial inattention or problems with visual recognition. He performed within the Average range on a test of visuospatial organization that required assembly of two-dimensional block designs from models Product manager). His copy of a spatially-complex geometric design (Rey Complex Figure) was within normal limits.   He performed as expected on a test of nonverbal problem-solving and conceptual flexibility that required inferring logical ways to sort geometric designs on the basis of ongoing feedback [employer]). He quickly inferred the initial sorting principle, deduced all possible sorting ideas, made an average number of errors, consistently maintained set and adaptively shifted to a new strategy as necessary.  He did not report significant affective distress on standardized, self-report symptom questionnaires. On the Beck Depression Inventory-II, his score of 5 fell within the minimal or non-depressed range. No symptom was endorsed beyond a 1 on a 0 - 3 scale. Of note, he did not report having had any suicidal thoughts within the past two weeks. On the Zung Self-Rating Anxiety Scale, his score of 35 fell within the normal range.    Summary & Conclusions Alek Okumura is a 65 year old man who reported having experienced cognitive difficulties since 2013 and transient confusional episodes since 2015. He stated his belief that his cognitive difficulties began shortly after he underwent surgery to repair a dissecting aortic aneurysm in September 2013. In addition, he reported that subsequent to that surgery he has experienced persisting fatigue, right upper extremity numbness and weakness, neuropathy in his legs as well as a several month period of depression.     Neuropsychological testing indicated that his memory functioning was lower than expected within the Low Average range and marked by  unevenness for both acquisition and delayed recall. He also demonstrated a weakness for attention; and clinical observations were notable for a few though clinically significant lapses of attention due to internal distractibility. There were no other indications of cognitive dysfunction. Other than longtime anxious mood, he did not report a significant level of affective distress nor difficulties with psychosocial adjustment at this time.    In sum, there are several possible causes for his cognitive weaknesses that are not mutually exclusive, namely the effects of anxious mood, obstructive sleep apnea, fatigue and possible hypoxic encephalopathy subsequent to surgery in 2013.  Diagnostic Impressions Attention/concentration deficit [R41.840] Memory difficulties [R41.3] Confusion, episodic [R41.0] Anxiety, mild [F41.9]  Recommendations 1. Mr. Yohey has reportedly been unable to tolerate a CPAP device. He was encouraged to re-visit the idea of using a CPAP with his physician.     2. He would likely benefit from mindfulness-based stress reduction training or other relaxation techniques as a way to promote improved mental focus and calmer mood.   3. Given his attentional weakness, report of daytime fatigue and history of obstructive sleep apnea, I wonder whether use of a wakefulness-enhancing agent or perhaps a stimulant medication could be helpful, if medically appropriate.   I have appreciated the opportunity to evaluate Mr. Friedel. The results and recommendations from this evaluation were discussed with him on 06/20/15. Please feel free to contact me with any  comments or questions.       _____________________ Jamey Ripa, Ph.D Licensed Psychologist         ADDENDUM-NEUROPSYCHOLOGICAL TEST RESULTS  Animal Naming Test Score= 20 70th (adjusted for age, gender and educational level)   Boston Naming Test Score= 34/60 82nd (adjusted for age, gender and educational level)               Controlled Oral Word Association Test Score=  57 words/2 repetitions 47th (adjusted for age, gender and educational level)   Rey Complex Figure: copy       Score= 30/36  Within normal limits    Stroop Color and Word Test  Score Residual % (adjusted for age and educational level)  Word 96 -14 16th     Color 66 -13 14th     Color-Word 24 -19   4th      Trails A Score=  42s  0e 37th (adjusted for age, gender and educational level)  Trails B Score= 88s  1e 28th (adjusted for age, gender and educational level)   Wechsler Adult Intelligence Scale-IV Subtest Scaled Score Percentile  Block Design   9 37th   Digit Span  Forward               Backward               Sequencing 10   8   11 11      50th   25th  63rd 63rd       Coding     9 37th     Wechsler Memory Scale-IV Index Index Score Percentile  Immediate Memory  87 19th    Auditory Memory  87 19th   Visual Memory  87  19th   Delayed Memory  82   12th   Visual Working Memory  37    9th    [employer] Test  Total errors=  10   68th (adjusted for age and education)  Perseverative errors=    5   63rd    Categories=    6 >16th     Trials to first category=  11 >16th    Failure to maintain set   0 >16th   Learning to learn= -1.04 >16th    Wide Range Achievement Test-4  Subtest  Raw score Standard score Percentile  Word Reading 65/70 106 66th

## 2015-06-24 DIAGNOSIS — M6240 Contracture of muscle, unspecified site: Secondary | ICD-10-CM | POA: Diagnosis not present

## 2015-06-24 DIAGNOSIS — M9903 Segmental and somatic dysfunction of lumbar region: Secondary | ICD-10-CM | POA: Diagnosis not present

## 2015-06-24 DIAGNOSIS — M9902 Segmental and somatic dysfunction of thoracic region: Secondary | ICD-10-CM | POA: Diagnosis not present

## 2015-06-24 DIAGNOSIS — M4126 Other idiopathic scoliosis, lumbar region: Secondary | ICD-10-CM | POA: Diagnosis not present

## 2015-06-24 DIAGNOSIS — M5137 Other intervertebral disc degeneration, lumbosacral region: Secondary | ICD-10-CM | POA: Diagnosis not present

## 2015-06-24 DIAGNOSIS — M5136 Other intervertebral disc degeneration, lumbar region: Secondary | ICD-10-CM | POA: Diagnosis not present

## 2015-06-24 DIAGNOSIS — M9905 Segmental and somatic dysfunction of pelvic region: Secondary | ICD-10-CM | POA: Diagnosis not present

## 2015-06-26 DIAGNOSIS — M4126 Other idiopathic scoliosis, lumbar region: Secondary | ICD-10-CM | POA: Diagnosis not present

## 2015-06-26 DIAGNOSIS — M5137 Other intervertebral disc degeneration, lumbosacral region: Secondary | ICD-10-CM | POA: Diagnosis not present

## 2015-06-26 DIAGNOSIS — M6240 Contracture of muscle, unspecified site: Secondary | ICD-10-CM | POA: Diagnosis not present

## 2015-06-26 DIAGNOSIS — M9902 Segmental and somatic dysfunction of thoracic region: Secondary | ICD-10-CM | POA: Diagnosis not present

## 2015-06-26 DIAGNOSIS — M5136 Other intervertebral disc degeneration, lumbar region: Secondary | ICD-10-CM | POA: Diagnosis not present

## 2015-06-26 DIAGNOSIS — M9905 Segmental and somatic dysfunction of pelvic region: Secondary | ICD-10-CM | POA: Diagnosis not present

## 2015-06-26 DIAGNOSIS — M9903 Segmental and somatic dysfunction of lumbar region: Secondary | ICD-10-CM | POA: Diagnosis not present

## 2015-07-01 DIAGNOSIS — M5136 Other intervertebral disc degeneration, lumbar region: Secondary | ICD-10-CM | POA: Diagnosis not present

## 2015-07-01 DIAGNOSIS — M4126 Other idiopathic scoliosis, lumbar region: Secondary | ICD-10-CM | POA: Diagnosis not present

## 2015-07-01 DIAGNOSIS — M5137 Other intervertebral disc degeneration, lumbosacral region: Secondary | ICD-10-CM | POA: Diagnosis not present

## 2015-07-01 DIAGNOSIS — M9903 Segmental and somatic dysfunction of lumbar region: Secondary | ICD-10-CM | POA: Diagnosis not present

## 2015-07-01 DIAGNOSIS — M9902 Segmental and somatic dysfunction of thoracic region: Secondary | ICD-10-CM | POA: Diagnosis not present

## 2015-07-01 DIAGNOSIS — M9905 Segmental and somatic dysfunction of pelvic region: Secondary | ICD-10-CM | POA: Diagnosis not present

## 2015-07-01 DIAGNOSIS — M6240 Contracture of muscle, unspecified site: Secondary | ICD-10-CM | POA: Diagnosis not present

## 2015-07-02 ENCOUNTER — Encounter: Payer: Self-pay | Admitting: *Deleted

## 2015-07-03 NOTE — Progress Notes (Signed)
Patient ID: [name], male   DOB: 1950-07-19, 65 y.o.   MRN: [id number]   65 y.o. with emergent type A disection repair 02/19/12 by PVT. Post op course complicated by DVT on 3 month course of coumadin.Previous smoker but has not restarted since surgery. Surgery 02/19/12 Status post repair of type A ascending dissection with Heaney shield graft used to replace ascending aorta and hemi-arch reconstruction, resuspension of aortic valve Had a residual 6cm infrarenal AAA Since surgery has  paresthesias in 4th and 5th digits on right hand Likely brachial plexus injury with axillary artery cannulation on right side.Still with tingling in fingers and bicipital weakness   Discussed the resolution of his pericardial effusion and ongoing need to monitor his resuspended AV   Had staged  AAA repair by Dr Kellie Simmering 10/2012  F/U venous duplex of LLE today showed resolution of thrombus and no DVT on left  Coumadin stopped   Echo 04/13/12 Normal EF and no AR  Study Conclusions  - Left ventricle: The cavity size was normal. Wall thickness was increased in a pattern of mild LVH. Systolic function was normal. The estimated ejection fraction was in the range of 60% to 65%. Septal bounce possible consistent with prior surgery. Wall motion was normal; there were no regional wall motion abnormalities. - Aortic valve: There was no stenosis. - Aorta: Mildly dilated aortic root. Aortic root dimension:33mm (ED). - Mitral valve: Mildly calcified annulus. Trivial regurgitation. - Left atrium: The atrium was mildly dilated. - Right ventricle: The cavity size was normal. Systolic function was normal. - Right atrium: The atrium was mildly dilated. - Pulmonary arteries: No complete TR doppler jet so unable to estimate PA systolic pressure. - Inferior vena cava: The vessel was normal in size; the respirophasic diameter changes were in the normal range (= 50%); findings are consistent with normal central  venous Pressure.  Reviewed CT done 10/15 and residual distal thoracic dissection stable  Stable appearance of type B thoracic aortic dissection beginning in the distal portion of the descending thoracic aorta and extending into the proximal abdominal aorta. The celiac, superior mesenteric and right renal artery all arise from the true lumen. Dissection flap extends into the proximal portion the left renal artery which is stable compared to prior exam. Symmetric enhancement of both kidneys is noted suggesting no significant impaired flow the either kidney.  Status post surgical repair of infrarenal abdominal aortic aneurysm with no evidence of endoleak.  Spent over 30 minutes discussing his aortic issues.  Patient was scarred by Dr Kellie Simmering about "retrograde flow"  Discussed issues of false lumen and need for surveillance Also discussed benefits of beta blocker to minimize change in residual aortic size    05/06/14  Myovue reviewed Normal no ischemia or infarct 04/15/14 Echo reviewed Normal EF 60-65% mild biatrial enlargement no valve disease   ROS: Denies fever, malais, weight loss, blurry vision, decreased visual acuity, cough, sputum, SOB, hemoptysis, pleuritic pain, palpitaitons, heartburn, abdominal pain, melena, lower extremity edema, claudication, or rash.  All other systems reviewed and negative  General: Affect appropriate Healthy:  appears stated age 7: normal Neck supple with no adenopathy JVP normal no bruits no thyromegaly Lungs clear with no wheezing and good diaphragmatic motion Heart:  S1/S2 SEM no AR murmur, no rub, gallop or click PMI normal Abdomen: benighn, BS positve, no tenderness, post AAA repair  no bruit.  No HSM or HJR Distal pulses intact with no bruits No edema Neuro non-focal Skin warm and dry No  muscular weakness   Current Outpatient Prescriptions  Medication Sig Dispense Refill  . ALPRAZolam (XANAX) 1 MG tablet TAKE 1/2 TABLET BY MOUTH  FOUR TIMES DAILY 60 tablet 0  . aspirin EC 81 MG tablet Take 81 mg by mouth daily with lunch.    [name] azelastine (ASTELIN) 0.1 % nasal spray Place 2 sprays into both nostrils 2 (two) times daily. 30 mL 2  . calcium carbonate (TUMS EX) 750 MG chewable tablet Chew 1 tablet by mouth daily as needed for heartburn.     . cholecalciferol (VITAMIN D) 1000 UNITS tablet Take 2,000 Units by mouth daily.    [name] EPIPEN 2-PAK 0.3 MG/0.3ML SOAJ injection Inject 0.3 mLs (0.3 mg total) into the muscle once. 2 Device 3  . fexofenadine (ALLEGRA) 180 MG tablet Take 90 mg by mouth 2 (two) times daily.     . fluticasone (FLONASE) 50 MCG/ACT nasal spray Place 2 sprays into both nostrils daily. 16 g 3  . HYDROcodone-acetaminophen (NORCO/VICODIN) 5-325 MG tablet Take 1 tablet by mouth every 4 (four) hours as needed for moderate pain or severe pain.    . metaxalone (SKELAXIN) 800 MG tablet Take 800 mg by mouth daily as needed. For pain  0  . metFORMIN (GLUCOPHAGE-XR) 500 MG 24 hr tablet Take 500 mg by mouth daily with supper.   3  . metoprolol tartrate (LOPRESSOR) 25 MG tablet TAKE 1 TABLET BY MOUTH 3 TIMES DAILY 270 tablet 1  . montelukast (SINGULAIR) 10 MG tablet Take 5 mg by mouth 2 (two) times daily.    . Multiple Vitamin (MULTIVITAMIN WITH MINERALS) TABS Take 1 tablet by mouth daily.    [name] omega-3 acid ethyl esters (LOVAZA) 1 G capsule Take 1 capsule (1 g total) by mouth 2 (two) times daily. 180 capsule 3  . OVER THE COUNTER MEDICATION Apply 1 application topically daily as needed (for acne). Oxy 10 vanishing cream    . polyethylene glycol powder (GLYCOLAX/MIRALAX) powder MIX 17G WITH LIQUID AND DRINK BY MOUTH DAILY. 527 g 5  . rosuvastatin (CRESTOR) 10 MG tablet Take 1 tablet (10 mg total) by mouth daily. 30 tablet 0  . thiamine (VITAMIN B-1) 100 MG tablet Take 100 mg by mouth 2 (two) times daily at 10 AM and 5 PM.     . vitamin B-12 (CYANOCOBALAMIN) 500 MCG tablet Take 500-1,000 mcg by mouth daily.     . [DISCONTINUED]  oxyCODONE (OXY IR/ROXICODONE) 5 MG immediate release tablet Take 1 tablet (5 mg total) by mouth every 4 (four) hours as needed. 30 tablet 0   No current facility-administered medications for this visit.    Allergies  Testosterone; Codeine; Gabapentin; Ultram; and Albuterol  Electrocardiogram:  11/17/12 SR rate 68 normal ECG   04/15/14 SR no acute changes compared to June  07/04/15  SR rate 64 normal   Assessment and Plan Dissection:  Grafted aortic root  F/u echo in a year AV resuspended no change in SEM no AR murmur AAA:  F/U CT per Dr Kellie Simmering for false lumen Chest Pain Resolved normal myovue 2015 DM Discussed low carb diet.  Target hemoglobin A1c is 6.5 or less.  Continue current medications. Chol:    Cholesterol is at goal.  Continue current dose of statin and diet Rx.  No myalgias or side effects.  F/U  LFT's in 6 months. Lab Results  Component Value Date   LDLCALC 56 01/13/2015   DVT:   Resolved by Duplex off anticoagulaiton related to vascular surgery  Bruit:  Duplex 12/2013 plaque no stenosis f/u 12/2015   Cognitive:  Some issues with memory ? Migrainous aura f/u neuro  Jenkins Rouge

## 2015-07-04 ENCOUNTER — Ambulatory Visit (INDEPENDENT_AMBULATORY_CARE_PROVIDER_SITE_OTHER): Payer: 59 | Admitting: Cardiovascular Disease

## 2015-07-04 ENCOUNTER — Encounter: Payer: Self-pay | Admitting: Cardiovascular Disease

## 2015-07-04 VITALS — BP 112/52 | HR 64 | Ht 76.0 in | Wt 233.0 lb

## 2015-07-04 DIAGNOSIS — I71019 Dissection of thoracic aorta, unspecified: Secondary | ICD-10-CM

## 2015-07-04 DIAGNOSIS — I359 Nonrheumatic aortic valve disorder, unspecified: Secondary | ICD-10-CM | POA: Diagnosis not present

## 2015-07-04 DIAGNOSIS — I7101 Dissection of thoracic aorta: Secondary | ICD-10-CM | POA: Diagnosis not present

## 2015-07-04 NOTE — Patient Instructions (Signed)

## 2015-07-08 ENCOUNTER — Ambulatory Visit: Payer: 59 | Admitting: Adult Health

## 2015-07-09 ENCOUNTER — Encounter: Payer: Self-pay | Admitting: Adult Health

## 2015-07-09 NOTE — Addendum Note (Signed)
Addended by: Freada Bergeron on: 07/09/2015 05:34 PM   Modules accepted: Orders

## 2015-07-14 ENCOUNTER — Other Ambulatory Visit: Payer: Self-pay | Admitting: Internal Medicine

## 2015-07-15 MED FILL — PREVIDENT 5000 1.1% DRY MOU: 1.1 | 30 days supply | Qty: 100 | Fill #0

## 2015-07-15 MED FILL — ALPRAZolam 1 MG TABS: 1 | 30 days supply | Qty: 60 | Fill #0

## 2015-07-15 NOTE — Telephone Encounter (Signed)
Faxed alprazolam back to pharmacy...[name]

## 2015-07-15 NOTE — Telephone Encounter (Signed)
Please advise. Thanks.  

## 2015-07-15 NOTE — Telephone Encounter (Signed)
Printed and signed.  

## 2015-07-16 ENCOUNTER — Other Ambulatory Visit (INDEPENDENT_AMBULATORY_CARE_PROVIDER_SITE_OTHER): Payer: 59

## 2015-07-16 ENCOUNTER — Ambulatory Visit (INDEPENDENT_AMBULATORY_CARE_PROVIDER_SITE_OTHER): Payer: 59 | Admitting: Internal Medicine

## 2015-07-16 ENCOUNTER — Encounter: Payer: 59 | Admitting: Internal Medicine

## 2015-07-16 ENCOUNTER — Encounter: Payer: Self-pay | Admitting: Internal Medicine

## 2015-07-16 VITALS — BP 150/60 | HR 70 | Temp 98.3°F | Resp 12 | Ht 76.0 in | Wt 237.0 lb

## 2015-07-16 DIAGNOSIS — E538 Deficiency of other specified B group vitamins: Secondary | ICD-10-CM

## 2015-07-16 DIAGNOSIS — E559 Vitamin D deficiency, unspecified: Secondary | ICD-10-CM

## 2015-07-16 DIAGNOSIS — E119 Type 2 diabetes mellitus without complications: Secondary | ICD-10-CM

## 2015-07-16 DIAGNOSIS — E785 Hyperlipidemia, unspecified: Secondary | ICD-10-CM

## 2015-07-16 DIAGNOSIS — G3184 Mild cognitive impairment, so stated: Secondary | ICD-10-CM

## 2015-07-16 DIAGNOSIS — Z0000 Encounter for general adult medical examination without abnormal findings: Secondary | ICD-10-CM

## 2015-07-16 LAB — LIPID PANEL
CHOL/HDL RATIO: 3
CHOLESTEROL: 108 mg/dL (ref 0–200)
HDL: 34.3 mg/dL — ABNORMAL LOW (ref >39.00)
LDL CALC: 49 mg/dL (ref 0–99)
NonHDL: 73.33
Triglycerides: 122 mg/dL (ref 0.0–149.0)
VLDL: 24.4 mg/dL (ref 0.0–40.0)

## 2015-07-16 LAB — VITAMIN B12: VITAMIN B 12: 400 pg/mL (ref 211–911)

## 2015-07-16 LAB — CBC
HEMATOCRIT: 45.4 % (ref 39.0–52.0)
Hemoglobin: 15.1 g/dL (ref 13.0–17.0)
MCHC: 33.3 g/dL (ref 30.0–36.0)
MCV: 95.2 fl (ref 78.0–100.0)
Platelets: 178 10*3/uL (ref 150.0–400.0)
RBC: 4.77 Mil/uL (ref 4.22–5.81)
RDW: 13.3 % (ref 11.5–15.5)
WBC: 7 10*3/uL (ref 4.0–10.5)

## 2015-07-16 LAB — COMPREHENSIVE METABOLIC PANEL
ALBUMIN: 4 g/dL (ref 3.5–5.2)
ALT: 34 U/L (ref 0–53)
AST: 23 U/L (ref 0–37)
Alkaline Phosphatase: 23 U/L — ABNORMAL LOW (ref 39–117)
BUN: 23 mg/dL (ref 6–23)
CHLORIDE: 106 meq/L (ref 96–112)
CO2: 26 meq/L (ref 19–32)
CREATININE: 0.94 mg/dL (ref 0.40–1.50)
Calcium: 9 mg/dL (ref 8.4–10.5)
GFR: 86.38 mL/min (ref >60.00)
GLUCOSE: 190 mg/dL — AB (ref 70–99)
Potassium: 4.3 mEq/L (ref 3.5–5.1)
SODIUM: 141 meq/L (ref 135–145)
Total Bilirubin: 0.6 mg/dL (ref 0.2–1.2)
Total Protein: 6.7 g/dL (ref 6.0–8.3)

## 2015-07-16 LAB — HEMOGLOBIN A1C: Hgb A1c MFr Bld: 6.6 % — ABNORMAL HIGH (ref 4.6–6.5)

## 2015-07-16 MED ORDER — TRAZODONE HCL 50 MG PO TABS: 25.0000 mg | ORAL_TABLET | Freq: Every evening | ORAL | Status: DC | PRN

## 2015-07-16 MED FILL — traZODone HCL 50 MG TABS: 50 | 30 days supply | Qty: 30 | Fill #0

## 2015-07-16 NOTE — Progress Notes (Signed)
Pre visit review using our clinic review tool, if applicable. No additional management support is needed unless otherwise documented below in the visit note. 

## 2015-07-16 NOTE — Patient Instructions (Addendum)
We have cleaned out the ear today so you should hear better.   We have sent in a low dose of the trazodone that you can talk to the neurologist about trying. If you decide to try take 1 pill about 30 minutes before bedtime to help with sleep.   We are checking the blood work to see if we can make any changes to improve on the energy. We will let you know if you need to continue the B12.

## 2015-07-17 ENCOUNTER — Other Ambulatory Visit (HOSPITAL_COMMUNITY)
Admission: RE | Admit: 2015-07-17 | Discharge: 2015-07-17 | Disposition: A | Discharge status: Home / Self Care | Payer: 59 | Source: Ambulatory Visit | Attending: Interventional Radiology | Admitting: Interventional Radiology

## 2015-07-17 ENCOUNTER — Ambulatory Visit
Admission: RE | Admit: 2015-07-17 | Discharge: 2015-07-17 | Disposition: A | Discharge status: Home / Self Care | Payer: 59 | Source: Ambulatory Visit | Attending: Internal Medicine | Admitting: Internal Medicine

## 2015-07-17 DIAGNOSIS — E041 Nontoxic single thyroid nodule: Secondary | ICD-10-CM | POA: Insufficient documentation

## 2015-07-18 ENCOUNTER — Encounter: Payer: Self-pay | Admitting: Internal Medicine

## 2015-07-18 DIAGNOSIS — Z0000 Encounter for general adult medical examination without abnormal findings: Secondary | ICD-10-CM | POA: Insufficient documentation

## 2015-07-18 DIAGNOSIS — Z0001 Encounter for general adult medical examination with abnormal findings: Secondary | ICD-10-CM | POA: Insufficient documentation

## 2015-07-18 NOTE — Assessment & Plan Note (Signed)
Controlled and neuropathy which is likely from the aortic dissection surgery. Taking metformin. Not on ACE-I or ARB. Checking HgA1c and adjust as needed. Minimal exercise.

## 2015-07-18 NOTE — Assessment & Plan Note (Addendum)
Still taking supplementation.

## 2015-07-18 NOTE — Progress Notes (Signed)
   Subjective:    Patient ID: [name], male    DOB: [birth year], 65 y.o.   MRN: [id number]  HPI The patient is a 65 YO man coming in for wellness. Still having problems with sleeping and using xanax to sleep and this does not always help. No other new concerns, getting thyroid biopsy this week.  PMH, [facility], social history reviewed and updated.   Review of Systems  Constitutional: Positive for fatigue. Negative for fever, activity change, appetite change and unexpected weight change.  Eyes: Negative.   Respiratory: Negative for cough, chest tightness, shortness of breath and wheezing.   Cardiovascular: Negative for chest pain, palpitations and leg swelling.  Gastrointestinal: Negative.   Genitourinary: Negative.   Musculoskeletal: Positive for arthralgias. Negative for gait problem.  Skin: Negative.   Neurological: Positive for numbness and headaches. Negative for tremors, weakness and light-headedness.  Psychiatric/Behavioral: Positive for sleep disturbance and dysphoric mood. The patient is nervous/anxious.       Objective:   Physical Exam  Constitutional: He is oriented to person, place, and time. He appears well-developed and well-nourished.  HENT:  Head: Normocephalic and atraumatic.  Eyes: EOM are normal.  Neck: Normal range of motion.  Cardiovascular: Normal rate.   Murmur heard. Pulmonary/Chest: Effort normal and breath sounds normal. No respiratory distress. He has no wheezes. He has no rales.  Abdominal: Soft. Bowel sounds are normal. He exhibits no distension. There is no tenderness.  Musculoskeletal: He exhibits no edema.  Neurological: He is alert and oriented to person, place, and time. Coordination normal.  numbness in the 4-5th right fingers, part of the arm, legs in the foot area.   Skin: Skin is warm and dry.  Psychiatric: He has a normal mood and affect. His behavior is normal.   Filed Vitals:   07/16/15 0941  BP: 150/60  Pulse: 70  Temp: 98.3 F  (36.8 C)  TempSrc: Oral  Resp: 12  Height: 6\' 4"  (1.93 m)  Weight: 237 lb (107.502 kg)  SpO2: 97%       Assessment & Plan:

## 2015-07-18 NOTE — Assessment & Plan Note (Signed)
Likely due to some hypoxia during the dissection repair. Seeing neurology and may talk to them about some stimulant agents to help with attention.

## 2015-07-18 NOTE — Assessment & Plan Note (Signed)
Checking labs today and adjust as needed. Immunizations up to date and colonoscopy up to date. Declines HIV screening today.

## 2015-07-18 NOTE — Assessment & Plan Note (Signed)
Checking lipid panel, on crestor and fish oil. No side effects, adjust as needed.

## 2015-07-21 ENCOUNTER — Other Ambulatory Visit: Payer: Self-pay | Admitting: Internal Medicine

## 2015-07-21 MED FILL — FLUTICASONE PROP 50 MCG SPR: 50 | 30 days supply | Qty: 16 | Fill #0

## 2015-07-22 MED FILL — POLYETHYLENE GLYCOL 3350 PO: 30 days supply | Qty: 527 | Fill #2

## 2015-07-28 ENCOUNTER — Ambulatory Visit (INDEPENDENT_AMBULATORY_CARE_PROVIDER_SITE_OTHER): Payer: 59 | Admitting: Adult Health

## 2015-07-28 ENCOUNTER — Encounter: Payer: Self-pay | Admitting: Adult Health

## 2015-07-28 VITALS — BP 120/64 | HR 86 | Resp 20 | Ht 76.0 in | Wt 237.0 lb

## 2015-07-28 DIAGNOSIS — G540 Brachial plexus disorders: Secondary | ICD-10-CM

## 2015-07-28 DIAGNOSIS — R4189 Other symptoms and signs involving cognitive functions and awareness: Secondary | ICD-10-CM

## 2015-07-28 NOTE — Patient Instructions (Signed)
Consider mindfulness-based stress reduction training or other relaxation techniques We will continue to monitor your symptoms  If your symptoms worsen or you develop new symptoms please let us know.

## 2015-07-28 NOTE — Progress Notes (Signed)
PATIENT: [name] DOB: Mar 26, 1951  REASON FOR VISIT: follow up- cognitive impairment, brachial plexopathy HISTORY FROM: patient  HISTORY OF PRESENT ILLNESS: [name] is a 65 year old male with a history of mild cognitive impairment and brachial plexopathy. He returns today for follow-up. The patient did go to neuropsychological testing. His results indicated that there may be several causes for his cognitive weaknesses such as anxious mood, obstructive sleep apnea, fatigue and possible hypoxic encephalopathy due to surgery in 2013. Dr. Georgiana Spinner also recommended considering treatment with CPAP again. He also felt that he would benefit from on mindful based stress reduction training or other relaxation techniques. He also felt that a possible stimulant could be beneficial. The patient feels that he continues to have issues with his thought process. He states that for example he was sending an email out at work and was trying to find a person in his contacts that was gone from the position for 20 years. He states that there has also been occasions where he has put his cereal in his MiraLAX. He continues to take Xanax at night and feels that that helped with his anxiety. He has tried trazodone in the past but could not tolerate this medication as he felt that it caused his mind to race. At this time the patient does not want to take any new medication. The patient states he is also been diagnosed with peripheral neuropathy and he feels that over time the numbness has gotten worse. He occasionally has discomfort but very mild. Patient states that he will have some trouble with the right hand however he has not been doing his exercises that he learned after eating diagnosed with brachial plexopathy. He returns today for an evaluation.  HISTORY 03/04/15: [name] is a 65 year old male with a history of mild cognitive impairment and brachial plexopathy. He returns today for follow-up. The patient  stopped the amitriptyline however he has not noticed any changes in his symptoms. Although he does not feel that amitriptyline was helpful for his migraines nor his neuropathy. He states he still having episodes where he feels confused, can't separate his thoughts. Also having some comprehension issues and difficulty with decision making. Patient also states that he has not been sleeping well. He uses Xanax to help him sleep at night. He states occasionally he will have to take a second dose in the middle the night. He states that if he does wake up he typically has a hard time falling back asleep. Patient states that his migraines have continued but with less frequency. He states that he has 6-8 migraines a month. He has been tried on gabapentin but could not tolerate this medication. The patient also has a history of sleep apnea but could not tolerate the fascial mask nor nasal pillows. Patient reports that he is no longer doing the exercises on the right hand that was taught to him in rehabilitation for his brachial plexopathy. He does feel that the right arm is feeling weaker. He is unsure if he should begin these exercises again. He returns today for an evaluation.  HISTORY  (DOHMEIER): [name] is a 64 y.o. male Is seen here as a referral by Dr Nils Pyle after reporting confusional episodes.  Formerly seen from Dr. Asa Lente for Brachial neuropathy, 08-26-14 Periods are episodes during which he feels confused, slightly disoriented to Time frame, he feels that he has trouble differentiating if he is dreaming, if he is awake, is in a memory .  He reports also that his behaviors have changed but he cannot really explain those. #1 his leaving cabinet doors open which is not his habit usually. He misplaces things frequently and gets panicky when not able to locate them. Also there is a difficulty with abstraction. He mentioned that he hurt somebody referred to a saying ; "I've been there, done  that and I brought the T-shirt " he could not understand what the "bought the T-shirt "part was about. He also has noticed that he sometimes gets off the elevator in the hospital and is not sure if he has to go left or right- it takes him a while to reorient himself. He also mentioned an event when he tried to and was preparing to mow his lawn, he had to fill the lawnmower with gasoline and held the petroleum can cap In one hand -he forgot what it was used for- until he realized that this was cap that closed the petroleum can. He has also trouble with recognize him some word sometimes in mid sentence sometimes word that he is just reading sometimes words he wants to use and speak. He is less comfortbale with change and new situations, any divergence form his routine.   Amnestic episodes. Family history positive for a mother with dementia in her 37 's. He spoke to his sister, and she has some symptoms , too. He is weaning off amitriptyline.     Last visit note 8- 2014 CD.  [name] is a 65 year old Caucasian gentleman, who underwent surgery for an aortic dissection in September 2013. Following the surgery he had first some numbness and weakness involving the right upper extremity. The patient had experienced shortly at the improvement since that time when he reported that he still had some residual numbness of the dominant hand as well as some noticeable weakness no she had no discomfort associated no pain. I referred the patient for I nerve conduction study with EMG which was performed on 12-20-12 by Dr. Floyde Parkins. The patient's blood conduction studies and EMGs confirm a proximal right median neuropathy of the brachial plexus, located lately at the ligament of Struthers. The changes were already chronic and moderate to severe. A cervical radiculopathy and impingement of the exiting nerves was not noted it is likely that the patient suffered a so-called double crush syndrome doing has had  surgery. It is important to note that the ulnar and radial nerve is of the right extremity are unaffected. Mr. Gelman reports some tingling and he extends his right upper extremity which is a new phenomenon. It is slow not troubling him very much but it is noticeable. The area of dysesthesia affects the skin dermatome. It extends from the medial biceps through the antebrachial region into fingers of his right hand. He had questions about using amitriptyline to treat the tingling numbness. It will also help with sleep initiation and amitriptyline is used in patients with neck pain as well as neuropathy. His neuropathy seems also to be related to the surgery and possibly the anesthesia perhaps the loss of blood flow.  The patient reports that his sudden loss of awareness spells that he had suffered around the time of the surgery has not occurred at least for the last four to six weeks. I have asked him to try to avoid decongestants. An EEG performed on 06-1612 was normal.    REVIEW OF SYSTEMS: Out of a complete 14 system review of symptoms, the patient complains only of the following symptoms,  and all other reviewed systems are negative.  Fatigue, eye itching, light sensitivity, shortness of breath, chest pain, insomnia, frequent waking, environmental allergies, difficulty urinating, frequency of urination, dizziness, confusion, decreased concentration, nervous/anxious  ALLERGIES: Allergies  Allergen Reactions  . Testosterone Shortness Of Breath    Medication:Androgel Pt states trouble breathing at night  . Codeine Nausea And Vomiting  . Gabapentin     "drunk" feeling  . Ultram [Tramadol] Nausea And Vomiting  . Albuterol Other (See Comments)    Panic attack    HOME MEDICATIONS: Outpatient Prescriptions Prior to Visit  Medication Sig Dispense Refill  . ALPRAZolam (XANAX) 1 MG tablet TAKE 1/2 TABLET BY MOUTH FOUR TIMES DAILY 60 tablet 0  . aspirin EC 81 MG tablet Take 81 mg by mouth  daily with lunch.    [name] azelastine (ASTELIN) 0.1 % nasal spray Place 2 sprays into both nostrils 2 (two) times daily. 30 mL 2  . calcium carbonate (TUMS EX) 750 MG chewable tablet Chew 1 tablet by mouth daily as needed for heartburn.     . cholecalciferol (VITAMIN D) 1000 UNITS tablet Take 2,000 Units by mouth daily.    [name] EPIPEN 2-PAK 0.3 MG/0.3ML SOAJ injection Inject 0.3 mLs (0.3 mg total) into the muscle once. 2 Device 3  . fexofenadine (ALLEGRA) 180 MG tablet Take 90 mg by mouth 2 (two) times daily.     . fluticasone (FLONASE) 50 MCG/ACT nasal spray PLACE 2 SPRAYS INTO BOTH NOSTRILS DAILY. 16 g 5  . HYDROcodone-acetaminophen (NORCO/VICODIN) 5-325 MG tablet Take 1 tablet by mouth every 4 (four) hours as needed for moderate pain or severe pain. Reported on 07/16/2015    . metFORMIN (GLUCOPHAGE-XR) 500 MG 24 hr tablet Take 500 mg by mouth daily with supper.   3  . metoprolol tartrate (LOPRESSOR) 25 MG tablet TAKE 1 TABLET BY MOUTH 3 TIMES DAILY 270 tablet 1  . montelukast (SINGULAIR) 10 MG tablet Take 5 mg by mouth 2 (two) times daily.    . Multiple Vitamin (MULTIVITAMIN WITH MINERALS) TABS Take 1 tablet by mouth daily.    [name] omega-3 acid ethyl esters (LOVAZA) 1 G capsule Take 1 capsule (1 g total) by mouth 2 (two) times daily. 180 capsule 3  . OVER THE COUNTER MEDICATION Apply 1 application topically daily as needed (for acne). Oxy 10 vanishing cream    . polyethylene glycol powder (GLYCOLAX/MIRALAX) powder MIX 17G WITH LIQUID AND DRINK BY MOUTH DAILY. 527 g 5  . rosuvastatin (CRESTOR) 10 MG tablet Take 1 tablet (10 mg total) by mouth daily. 30 tablet 0  . thiamine (VITAMIN B-1) 100 MG tablet Take 100 mg by mouth 2 (two) times daily at 10 AM and 5 PM.     . vitamin B-12 (CYANOCOBALAMIN) 500 MCG tablet Take 500-1,000 mcg by mouth daily.     . metaxalone (SKELAXIN) 800 MG tablet Take 800 mg by mouth daily as needed. Reported on 07/16/2015  0  . traZODone (DESYREL) 50 MG tablet Take 0.5-1 tablets (25-50  mg total) by mouth at bedtime as needed for sleep. 30 tablet 3   No facility-administered medications prior to visit.    PAST MEDICAL HISTORY: Past Medical History  Diagnosis Date  . ERECTILE DYSFUNCTION, ORGANIC   . Unspecified vitamin D deficiency   . HYPERLIPIDEMIA   . ALLERGIC RHINITIS   . Dissecting aortic aneurysm, thoracic ([place]) 02/20/2012    s/p emergent surgical repair  . Anxiety   . OBSTRUCTIVE SLEEP APNEA 02/2008 sleep study  moderate & central- not able to tolerate CPAP   . GERD (gastroesophageal reflux disease)     related to diet & anxiety   . COPD (chronic obstructive pulmonary disease) ([place])     also states he has been told that he has slight emphysema   . Arthritis     OA, hands, - thumbs- specifically  . DVT (deep venous thrombosis) ([facility])     coumadin- x3 months , post op  . Anemia     post op, treated /w Fe  . Hypertension     pt. followwed by Dr. Johnsie Cancel  . MIGRAINE HEADACHE     induced from physical , emotional stress , but sometimes occur randomly, also experiences BPPV  . Brachial plexopathy     double crush injury with median nerve damage on the right dominant hand.   [name] DM2 (diabetes mellitus, type 2) ([place])   . AAA (abdominal aortic aneurysm) ([place])   . Thyroid nodule 04/21/2015    2.3 cm on right, incidental finding on 03/2015 CT chest - f/u US ordered    PAST SURGICAL HISTORY: Past Surgical History  Procedure Laterality Date  . Nasal fx without repair    . Thoracic aortic aneurysm repair  02/20/2012    Procedure: THORACIC ASCENDING ANEURYSM REPAIR (AAA);  Surgeon: Ivin Poot, MD;  Location: [facility];  Service: Open Heart Surgery;  Laterality: N/A;  . Abdominal aortic aneurysm repair N/A 11/01/2012    Procedure: ANEURYSM ABDOMINAL AORTIC REPAIR;  Surgeon: Mal Misty, MD;  Location[facility];  Service: Vascular;  Laterality: N/A;  Resection and Grafting of Abdominal Aortic Aneurysm,(AORTA BI ILIAC)    FAMILY HISTORY: Family History  Problem  Relation Age of Onset  . Diabetes Mother   . Heart disease Maternal Uncle   . Heart disease Paternal Uncle   . Heart disease Cousin   . Lung cancer Cousin     2 cousins  . Heart disease Maternal Grandmother   . Deep vein thrombosis Father     PE  . Varicose Veins Father   . Stroke Mother   . Dementia Mother     SOCIAL HISTORY: Social History   Social History  . Marital Status: Divorced    Spouse Name: N/A  . Number of Children: 2  . Years of Education: acad. deg.   Occupational History  . internal med resident [employer] History Main Topics  . Smoking status: Former Smoker -- 0.10 packs/day    Types: Cigarettes    Quit date: 02/18/2012  . Smokeless tobacco: Never Used     Comment: 10 cigs/day, quit 02/2012 after TAA emergent repair  . Alcohol Use: 0.0 oz/week    0 Standard drinks or equivalent per week     Comment: shot of Tequila every day   . Drug Use: No  . Sexual Activity: Not on file   Other Topics Concern  . Not on file   Social History Narrative   Patient is single and lives at home alone.    Arboriculturist.   Patient works at [employer] full time.   Right handed.   Caffeine one cup of coffee daily.       PHYSICAL EXAM  Filed Vitals:   07/28/15 1506  BP: 120/64  Pulse: 86  Resp: 20  Height: 6\' 4"  (1.93 m)  Weight: 237 lb (107.502 kg)   Body mass index is 28.86 kg/(m^2).  Generalized: Well developed, in no acute distress  Neurological examination  Mentation: Alert oriented to time, place, history taking. Follows all commands speech and language fluent Cranial nerve II-XII: Pupils were equal round reactive to light. Extraocular movements were full, visual field were full on confrontational test. Facial sensation and strength were normal. Uvula tongue midline. Head turning and shoulder shrug  were normal and symmetric. Motor: The motor testing reveals 5 over 5 strength of all 4 extremities. Good symmetric motor tone is noted  throughout.  Sensory: Sensory testing is intact to soft touch on all 4 extremities. No evidence of extinction is noted.  Coordination: Cerebellar testing reveals good finger-nose-finger and heel-to-shin bilaterally.  Gait and station: Gait is normal. Tandem gait is unsteady. Romberg is negative. No drift is seen.  Reflexes: Deep tendon reflexes are symmetric and normal bilaterally.   DIAGNOSTIC DATA (LABS, IMAGING, TESTING) - I reviewed patient records, labs, notes, testing and imaging myself where available.  Lab Results  Component Value Date   WBC 7.0 07/16/2015   HGB 15.1 07/16/2015   HCT 45.4 07/16/2015   MCV 95.2 07/16/2015   PLT 178.0 07/16/2015      Component Value Date/Time   NA 141 07/16/2015 1043   K 4.3 07/16/2015 1043   CL 106 07/16/2015 1043   CO2 26 07/16/2015 1043   GLUCOSE 190* 07/16/2015 1043   BUN 23 07/16/2015 1043   CREATININE 0.94 07/16/2015 1043   CREATININE 0.98 04/03/2015 0957   CALCIUM 9.0 07/16/2015 1043   PROT 6.7 07/16/2015 1043   ALBUMIN 4.0 07/16/2015 1043   AST 23 07/16/2015 1043   ALT 34 07/16/2015 1043   ALKPHOS 23* 07/16/2015 1043   BILITOT 0.6 07/16/2015 1043   GFRNONAA 90* 09/25/2013 1400   GFRNONAA >89 06/09/2012 1515   GFRAA >90 09/25/2013 1400   GFRAA >89 06/09/2012 1515   Lab Results  Component Value Date   CHOL 108 07/16/2015   HDL 34.30* 07/16/2015   LDLCALC 49 07/16/2015   LDLDIRECT 114* 03/21/2010   TRIG 122.0 07/16/2015   CHOLHDL 3 07/16/2015   Lab Results  Component Value Date   HGBA1C 6.6* 07/16/2015   Lab Results  Component Value Date   VITAMINB12 400 07/16/2015       ASSESSMENT AND PLAN 65 y.o. year old male  has a past medical history of ERECTILE DYSFUNCTION, ORGANIC; Unspecified vitamin D deficiency; HYPERLIPIDEMIA; ALLERGIC RHINITIS; Dissecting aortic aneurysm, thoracic ([place]) (02/20/2012); Anxiety; OBSTRUCTIVE SLEEP APNEA (02/2008 sleep study); GERD (gastroesophageal reflux disease); COPD (chronic  obstructive pulmonary disease) ([place]); Arthritis; DVT (deep venous thrombosis) ([place]); Anemia; Hypertension; MIGRAINE HEADACHE; Brachial plexopathy; DM2 (diabetes mellitus, type 2) ([place]); AAA (abdominal aortic aneurysm) ([place]); and Thyroid nodule (04/21/2015). here with:  1. Memory difficulty 2. Brachial plexopathy  The patient is encouraged to participate in relaxation techniques as suggested by Dr. Valentina Shaggy. Patient is amenable to this. He is encouraged to continue with the exercises that he learned for brachial plexopathy. He will follow-up in 6 months with Dr. Brett Fairy or sooner if needed.     Ward Givens, MSN, NP-C 07/28/2015, 3:19 PM [facility] [address] [place], [place] [address] [phone]

## 2015-07-29 NOTE — Progress Notes (Signed)
I agree with the assessment and plan as directed by NP .The patient is known to me .   Jaesean Litzau, MD  

## 2015-08-13 MED FILL — METFORMIN HCL ER 500 MG TAB: 500 | 90 days supply | Qty: 90 | Fill #2

## 2015-08-13 MED FILL — FLUTICASONE PROP 50 MCG SPR: 50 | 30 days supply | Qty: 16 | Fill #1

## 2015-08-14 ENCOUNTER — Other Ambulatory Visit: Payer: Self-pay | Admitting: *Deleted

## 2015-08-14 MED ORDER — METOPROLOL TARTRATE 25 MG PO TABS: 25.0000 mg | ORAL_TABLET | Freq: Three times a day (TID) | ORAL | Status: DC

## 2015-08-14 MED FILL — METOPROLOL TARTRATE 25 MG T: 25 | 90 days supply | Qty: 270 | Fill #0

## 2015-08-16 ENCOUNTER — Encounter: Payer: Self-pay | Admitting: Internal Medicine

## 2015-08-18 ENCOUNTER — Other Ambulatory Visit: Payer: Self-pay | Admitting: Internal Medicine

## 2015-08-18 DIAGNOSIS — E041 Nontoxic single thyroid nodule: Secondary | ICD-10-CM

## 2015-08-25 ENCOUNTER — Other Ambulatory Visit: Payer: Self-pay | Admitting: Internal Medicine

## 2015-08-25 MED FILL — MONTELUKAST SOD 10 MG TAB: 10 | 90 days supply | Qty: 90 | Fill #1

## 2015-08-26 MED FILL — ALPRAZolam 1 MG TABS: 1 | 30 days supply | Qty: 60 | Fill #0

## 2015-08-26 NOTE — Telephone Encounter (Signed)
Faxed to pharmacy

## 2015-08-29 ENCOUNTER — Encounter: Payer: Self-pay | Admitting: Internal Medicine

## 2015-09-01 ENCOUNTER — Ambulatory Visit (INDEPENDENT_AMBULATORY_CARE_PROVIDER_SITE_OTHER): Payer: 59 | Admitting: Internal Medicine

## 2015-09-01 ENCOUNTER — Encounter: Payer: Self-pay | Admitting: Internal Medicine

## 2015-09-01 DIAGNOSIS — L570 Actinic keratosis: Secondary | ICD-10-CM

## 2015-09-01 NOTE — Progress Notes (Signed)
Patient ID: GASTON HARDEN, male   DOB: 13-May-1951, 65 y.o.   MRN: [id number]  This was an informal visit wherein I evaluated three different skin lesions for Mr. Borowsky.  1. Actinic keratosis: On his right dorsal wrist, he had an ill-defined scaly plaque 1cm in diameter that I believe was an actinic keratosis, less likely a seborrheic keratosis. I froze this today; if it recurs in 2-4 weeks, I asked him to come back to be re-evaluated.   2. Seborrheic keratosis: On his left temple, he had a stuck-on hyperpigmented papule that I believed was a seborrheic keratosis. I reassured him this was benign and he elected not to have it frozen off for now.  3. Common acquired nevus: On his left upper chest, he has a symmetric, well-defined, evenly-colored, 27mm hyperpigmented macule that he says has not changed in the last few years. I believe this is a benign nevus without features concerning for melanoma and recommended keeping an eye on it.  Loleta Chance, MD

## 2015-09-02 NOTE — Progress Notes (Signed)
Internal Medicine Clinic Attending  Case discussed with Dr. Melburn Hake at the time of the visit.  We reviewed the resident's history and exam and pertinent patient test results.  I agree with the assessment, diagnosis, and plan of care documented in the resident's note. I was present for the entire visit. Dr Melburn Hake obtained verbal consent in my presence. Verified the pt and site and procedure.

## 2015-09-16 DIAGNOSIS — D440 Neoplasm of uncertain behavior of thyroid gland: Secondary | ICD-10-CM | POA: Diagnosis not present

## 2015-09-22 ENCOUNTER — Other Ambulatory Visit: Payer: Self-pay | Admitting: Family

## 2015-09-22 ENCOUNTER — Encounter: Payer: Self-pay | Admitting: Internal Medicine

## 2015-09-22 ENCOUNTER — Ambulatory Visit (INDEPENDENT_AMBULATORY_CARE_PROVIDER_SITE_OTHER): Payer: 59 | Admitting: Internal Medicine

## 2015-09-22 VITALS — BP 145/61 | HR 62 | Ht 76.0 in | Wt 237.7 lb

## 2015-09-22 DIAGNOSIS — R238 Other skin changes: Secondary | ICD-10-CM | POA: Insufficient documentation

## 2015-09-22 DIAGNOSIS — L859 Epidermal thickening, unspecified: Secondary | ICD-10-CM

## 2015-09-22 MED FILL — predniSONE 10 MG TABS: 10 | 5 days supply | Qty: 10 | Fill #0

## 2015-09-22 NOTE — Assessment & Plan Note (Signed)
He has a hyperkeratotic papule on his right dorsal forearm; at the last visit, I felt this was most likely an actinic keratosis, but it did not resolve after a deep freeze. Today, I wonder whether this is actually a squamous cell carcinoma in situ, or Bowen's disease as it did not resolve with a freeze and it has a subtle papular component. I offered a shave biopsy to get a definitive answer, but he elected to try another freeze cycle, which I think is reasonable and I have done today.  If the lesion does not completely disappear after blistering in the next 2-3 weeks, I asked him to come back for a shave biopsy.

## 2015-09-22 NOTE — Progress Notes (Signed)
Patient ID: [name], male   DOB: April 13, 1951, 65 y.o.   MRN: [id number] [facility] Subjective:   Patient ID: [name] male   DOB: Dec 23, 1950 65 y.o.   MRN: [id number]  HPI: RyanArav CAL Davidson is a 65 y.o. male with a history detailed below here for evaluation of a skin lesion.  Skin lesion on right dorsal wrist: I evaluated this on 3/20 and felt it was likely an actinic keratosis. After freezing it, the lesion blistered but has persisted. I questioned whether this could be squamous cell carcinoma in situ so I asked him to return for either a re-freeze or shave biopsy today. He elected to try another freeze so we've done that today.  Past Medical History  Diagnosis Date  . ERECTILE DYSFUNCTION, ORGANIC   . Unspecified vitamin D deficiency   . HYPERLIPIDEMIA   . ALLERGIC RHINITIS   . Dissecting aortic aneurysm, thoracic ([place]) 02/20/2012    s/p emergent surgical repair  . Anxiety   . OBSTRUCTIVE SLEEP APNEA 02/2008 sleep study    moderate & central- not able to tolerate CPAP   . GERD (gastroesophageal reflux disease)     related to diet & anxiety   . COPD (chronic obstructive pulmonary disease) ([place])     also states he has been told that he has slight emphysema   . Arthritis     OA, hands, - thumbs- specifically  . DVT (deep venous thrombosis) ([facility])     coumadin- x3 months , post op  . Anemia     post op, treated /w Fe  . Hypertension     pt. followwed by Dr. Johnsie Cancel  . MIGRAINE HEADACHE     induced from physical , emotional stress , but sometimes occur randomly, also experiences BPPV  . Brachial plexopathy     double crush injury with median nerve damage on the right dominant hand.   [name] DM2 (diabetes mellitus, type 2) ([place])   . AAA (abdominal aortic aneurysm) ([place])   . Thyroid nodule 04/21/2015    2.3 cm on right, incidental finding on 03/2015 CT chest - f/u US ordered   Current Outpatient Prescriptions  Medication Sig Dispense Refill  .  ALPRAZolam (XANAX) 1 MG tablet TAKE 1/2 TABLET BY MOUTH 4 TIMES DAILY 60 tablet 1  . aspirin EC 81 MG tablet Take 81 mg by mouth daily with lunch.    [name] azelastine (ASTELIN) 0.1 % nasal spray Place 2 sprays into both nostrils 2 (two) times daily. 30 mL 2  . calcium carbonate (TUMS EX) 750 MG chewable tablet Chew 1 tablet by mouth daily as needed for heartburn.     . cholecalciferol (VITAMIN D) 1000 UNITS tablet Take 2,000 Units by mouth daily.    [name] EPIPEN 2-PAK 0.3 MG/0.3ML SOAJ injection Inject 0.3 mLs (0.3 mg total) into the muscle once. 2 Device 3  . fexofenadine (ALLEGRA) 180 MG tablet Take 90 mg by mouth 2 (two) times daily.     . fluticasone (FLONASE) 50 MCG/ACT nasal spray PLACE 2 SPRAYS INTO BOTH NOSTRILS DAILY. 16 g 5  . HYDROcodone-acetaminophen (NORCO/VICODIN) 5-325 MG tablet Take 1 tablet by mouth every 4 (four) hours as needed for moderate pain or severe pain. Reported on 07/16/2015    . metFORMIN (GLUCOPHAGE-XR) 500 MG 24 hr tablet Take 500 mg by mouth daily with supper.   3  . metoprolol tartrate (LOPRESSOR) 25 MG tablet Take 1 tablet (25 mg total) by mouth 3 (  three) times daily. 270 tablet 3  . montelukast (SINGULAIR) 10 MG tablet Take 5 mg by mouth 2 (two) times daily.    . Multiple Vitamin (MULTIVITAMIN WITH MINERALS) TABS Take 1 tablet by mouth daily.    [name] omega-3 acid ethyl esters (LOVAZA) 1 G capsule Take 1 capsule (1 g total) by mouth 2 (two) times daily. 180 capsule 3  . OVER THE COUNTER MEDICATION Apply 1 application topically daily as needed (for acne). Oxy 10 vanishing cream    . polyethylene glycol powder (GLYCOLAX/MIRALAX) powder MIX 17G WITH LIQUID AND DRINK BY MOUTH DAILY. 527 g 5  . predniSONE (DELTASONE) 10 MG tablet TAKE 2 TABLETS BY MOUTH DAILY AS NEEDED FOR ASTHMA FLARE-UP 10 tablet 0  . rosuvastatin (CRESTOR) 10 MG tablet Take 1 tablet (10 mg total) by mouth daily. 30 tablet 0  . thiamine (VITAMIN B-1) 100 MG tablet Take 100 mg by mouth 2 (two) times daily at 10 AM  and 5 PM.     . vitamin B-12 (CYANOCOBALAMIN) 500 MCG tablet Take 500-1,000 mcg by mouth daily.     . [DISCONTINUED] oxyCODONE (OXY IR/ROXICODONE) 5 MG immediate release tablet Take 1 tablet (5 mg total) by mouth every 4 (four) hours as needed. 30 tablet 0   No current facility-administered medications for this visit.   Family History  Problem Relation Age of Onset  . Diabetes Mother   . Heart disease Maternal Uncle   . Heart disease Paternal Uncle   . Heart disease Cousin   . Lung cancer Cousin     2 cousins  . Heart disease Maternal Grandmother   . Deep vein thrombosis Father     PE  . Varicose Veins Father   . Stroke Mother   . Dementia Mother    Social History   Social History  . Marital Status: Divorced    Spouse Name: N/A  . Number of Children: 2  . Years of Education: acad. deg.   Occupational History  . internal med resident [place] History Main Topics  . Smoking status: Former Smoker -- 0.10 packs/day    Types: Cigarettes    Quit date: 02/18/2012  . Smokeless tobacco: Never Used     Comment: 10 cigs/day, quit 02/2012 after TAA emergent repair  . Alcohol Use: 0.0 oz/week    0 Standard drinks or equivalent per week     Comment: shot of Tequila every day   . Drug Use: No  . Sexual Activity: Not on file   Other Topics Concern  . Not on file   Social History Narrative   Patient is single and lives at home alone.    Arboriculturist.   Patient works at [employer] full time.   Right handed.   Caffeine one cup of coffee daily.    Review of Systems  Constitutional: Negative for fever.  Skin: Positive for rash.  Neurological: Negative for headaches.   Objective:  Physical Exam: Filed Vitals:   09/22/15 1421  BP: 145/61  Pulse: 62  Height: 6\' 4"  (1.93 m)  Weight: 237 lb 11.2 oz (107.82 kg)  SpO2: 97%   General: resting in chair comfortably, appropriately conversational Ext: warm and well perfused, without pedal edema Skin: 27mm  hyperkeratotic papule on right dorsal forearm Neuro: alert and oriented X3, cranial nerves II-XII grossly intact, moving all extremities well  Cryotherapy procedure: Consent was obtained verbally after risks of infection and bleeding were discussed. The lesion was frozen for  15 seconds. The patient tolerated the freeze and there was no bleeding nor other complication. Dr. Daryll Drown was present for the entirety of the procedure.  Assessment & Plan:  Case discussed with Dr. Marrianne Mood of skin He has a hyperkeratotic papule on his right dorsal forearm; at the last visit, I felt this was most likely an actinic keratosis, but it did not resolve after a deep freeze. Today, I wonder whether this is actually a squamous cell carcinoma in situ, or Bowen's disease as it did not resolve with a freeze and it has a subtle papular component. I offered a shave biopsy to get a definitive answer, but he elected to try another freeze cycle, which I think is reasonable and I have done today.  If the lesion does not completely disappear after blistering in the next 2-3 weeks, I asked him to come back for a shave biopsy.   Follow Up: In 2-3 weeks if the lesion does not go away

## 2015-09-25 ENCOUNTER — Telehealth: Payer: Self-pay | Admitting: Internal Medicine

## 2015-09-25 NOTE — Telephone Encounter (Signed)
Patient says he uses it for allergies. He takes a 10 mg tablet and divides it in 4 pieces. He said its broken down into 2.5 mg. He says he has not had it filled in a long time, but when pollen is bad he needs it. He is requesting 10 mg tablets. Please advise, thanks.

## 2015-09-25 NOTE — Telephone Encounter (Signed)
Patient aware and will deal with it. He does not want to try anything else at this time.

## 2015-09-25 NOTE — Telephone Encounter (Signed)
This medicine should not be used for that purpose.

## 2015-09-25 NOTE — Telephone Encounter (Signed)
Please advise on correct dosage, thanks.

## 2015-09-25 NOTE — Telephone Encounter (Signed)
Patient caleld regarding current prednisone. He states that he needs 2.5 mg twice daily. The current dose doesn't match up, and he does not understand why. i was unable to find any notation regarding the change. Please advise.

## 2015-09-25 NOTE — Telephone Encounter (Signed)
This is for asthma flare and correct dosage 20 mg daily for 5 days. What is he needing 2.5 mg twice daily for?

## 2015-10-06 ENCOUNTER — Encounter: Payer: 59 | Admitting: Internal Medicine

## 2015-10-06 MED FILL — POLYETHYLENE GLYCOL 3350 PO: 30 days supply | Qty: 527 | Fill #3

## 2015-10-06 MED FILL — OMEGA-3 ETHYL ESTERS 1 GM C: 1 | 90 days supply | Qty: 180 | Fill #3

## 2015-10-06 MED FILL — FLUTICASONE PROP 50 MCG SPR: 50 | 30 days supply | Qty: 16 | Fill #2

## 2015-10-08 NOTE — Progress Notes (Signed)
Internal Medicine Clinic Attending  I saw and evaluated the patient.  I personally confirmed the key portions of the history and exam documented by Dr. Melburn Hake and I reviewed pertinent patient test results.  The assessment, diagnosis, and plan were formulated together and I agree with the documentation in the resident's note.  I was present for procedure.

## 2015-10-23 ENCOUNTER — Other Ambulatory Visit: Payer: Self-pay | Admitting: Internal Medicine

## 2015-10-23 MED FILL — ALPRAZolam 1 MG TABS: 1 | 30 days supply | Qty: 60 | Fill #1

## 2015-10-23 MED FILL — AZELASTINE HCL 137 MCG SPRY: 0.1 | 50 days supply | Qty: 30 | Fill #0

## 2015-11-05 ENCOUNTER — Telehealth: Payer: Self-pay | Admitting: Internal Medicine

## 2015-11-05 NOTE — Telephone Encounter (Signed)
Pt request to speak to you, he said couple days ago that spoke with about issue of insurance. Please call him back

## 2015-11-06 NOTE — Telephone Encounter (Signed)
LVM for pt to call back as soon as possible.   

## 2015-11-06 NOTE — Telephone Encounter (Signed)
Pt called back. He stated that he spoke to me a few days ago but after speaking to him a little bit more, he may have spoke to a [place] in billing.  Pt states that he was charged full price for the cologuard that was done in 2016. Patient currently has the [facility] insurance. Informed patient that I will contact all parties involved with the matter to see if there is anything that can be done to get the procedure covered.

## 2015-11-11 NOTE — Telephone Encounter (Signed)
Contacted Glass blower/designer.  They did 2 appeals and [employer] denied both. Patient also did there own appeals.  [place] Therapist, occupational) explained that self insured plans do not have to follow the rules as far as covering the [employer] test since they are self insured.   My next step is to contact [employer] directly and the patient (their appeals).

## 2015-11-12 MED FILL — AMOXICILLIN 500 MG CAPSULE: 500 | 3 days supply | Qty: 12 | Fill #2

## 2015-11-12 MED FILL — METOPROLOL TARTRATE 25 MG T: 25 | 90 days supply | Qty: 270 | Fill #1

## 2015-11-12 MED FILL — METFORMIN HCL ER 500 MG TAB: 500 | 90 days supply | Qty: 90 | Fill #3

## 2015-11-12 MED FILL — PREVIDENT 5000 1.1% DRY MOU: 1.1 | 30 days supply | Qty: 100 | Fill #1

## 2015-11-12 NOTE — Telephone Encounter (Signed)
LVM for pt to call back as soon as possible.   

## 2015-11-12 NOTE — Telephone Encounter (Signed)
Pt called back and I gave the benefit information listed below.  Pt is scanning the appeal denial and sending it to me for additional review.

## 2015-11-12 NOTE — Telephone Encounter (Signed)
Spoke to [employer]-   She states that the plan for 2016 did not cover cologuard even as routine prevenative/the benefit is not covered even as an exclusion.  She states that the patient appeal has been entered yet (so it is there and review has not started or has not arrived yet).   Still waiting on pt to return call.

## 2015-11-27 DIAGNOSIS — H04123 Dry eye syndrome of bilateral lacrimal glands: Secondary | ICD-10-CM | POA: Diagnosis not present

## 2015-11-27 DIAGNOSIS — H10413 Chronic giant papillary conjunctivitis, bilateral: Secondary | ICD-10-CM | POA: Diagnosis not present

## 2015-11-27 DIAGNOSIS — H25813 Combined forms of age-related cataract, bilateral: Secondary | ICD-10-CM | POA: Diagnosis not present

## 2015-11-27 DIAGNOSIS — H401131 Primary open-angle glaucoma, bilateral, mild stage: Secondary | ICD-10-CM | POA: Diagnosis not present

## 2015-11-27 DIAGNOSIS — E119 Type 2 diabetes mellitus without complications: Secondary | ICD-10-CM | POA: Diagnosis not present

## 2015-12-02 NOTE — Telephone Encounter (Signed)
Contacted pt and informed that [employer] does not cover. The appeals process will allow the provider to send a letter of medical necessity for review.   Dr. Asa Lente did order the test, however, would you sign the letter on her behalf?

## 2015-12-04 NOTE — Telephone Encounter (Signed)
Yeah, that's fine

## 2015-12-08 ENCOUNTER — Other Ambulatory Visit: Payer: Self-pay | Admitting: Internal Medicine

## 2015-12-08 MED FILL — POLYETHYLENE GLYCOL 3350 PO: 30 days supply | Qty: 527 | Fill #4

## 2015-12-08 MED FILL — MONTELUKAST SOD 10 MG TAB: 10 | 90 days supply | Qty: 90 | Fill #0

## 2015-12-08 MED FILL — FLUTICASONE PROP 50 MCG SPR: 50 | 90 days supply | Qty: 48 | Fill #3

## 2015-12-09 MED FILL — ALPRAZolam 1 MG TABS: 1 | 30 days supply | Qty: 60 | Fill #0

## 2015-12-09 MED FILL — HYDROCODON-APAP 5-325: 5-325 | 2 days supply | Qty: 18 | Fill #0

## 2015-12-09 NOTE — Telephone Encounter (Signed)
Letter written and printed for review.

## 2015-12-09 NOTE — Telephone Encounter (Signed)
Faxed to pharmacy

## 2015-12-22 NOTE — Telephone Encounter (Signed)
Appeal was faxed on 12/10/2015

## 2015-12-31 ENCOUNTER — Ambulatory Visit: Payer: 59 | Admitting: Neurology

## 2016-01-05 ENCOUNTER — Other Ambulatory Visit: Payer: Self-pay | Admitting: Internal Medicine

## 2016-01-05 MED FILL — OMEGA-3 ETHYL ESTERS 1 GM C: 1 | 90 days supply | Qty: 180 | Fill #0

## 2016-01-06 ENCOUNTER — Other Ambulatory Visit: Payer: Self-pay | Admitting: Cardiovascular Disease

## 2016-01-06 DIAGNOSIS — I6523 Occlusion and stenosis of bilateral carotid arteries: Secondary | ICD-10-CM

## 2016-01-09 ENCOUNTER — Encounter (HOSPITAL_COMMUNITY): Payer: 59

## 2016-01-09 ENCOUNTER — Ambulatory Visit (HOSPITAL_COMMUNITY)
Admission: RE | Admit: 2016-01-09 | Discharge: 2016-01-09 | Disposition: A | Discharge status: Home / Self Care | Payer: 59 | Source: Ambulatory Visit | Attending: Cardiology | Admitting: Cardiology

## 2016-01-09 DIAGNOSIS — K219 Gastro-esophageal reflux disease without esophagitis: Secondary | ICD-10-CM | POA: Insufficient documentation

## 2016-01-09 DIAGNOSIS — F419 Anxiety disorder, unspecified: Secondary | ICD-10-CM | POA: Insufficient documentation

## 2016-01-09 DIAGNOSIS — E785 Hyperlipidemia, unspecified: Secondary | ICD-10-CM | POA: Insufficient documentation

## 2016-01-09 DIAGNOSIS — J449 Chronic obstructive pulmonary disease, unspecified: Secondary | ICD-10-CM | POA: Insufficient documentation

## 2016-01-09 DIAGNOSIS — G4733 Obstructive sleep apnea (adult) (pediatric): Secondary | ICD-10-CM | POA: Insufficient documentation

## 2016-01-09 DIAGNOSIS — E119 Type 2 diabetes mellitus without complications: Secondary | ICD-10-CM | POA: Insufficient documentation

## 2016-01-09 DIAGNOSIS — I10 Essential (primary) hypertension: Secondary | ICD-10-CM | POA: Diagnosis not present

## 2016-01-09 DIAGNOSIS — I6523 Occlusion and stenosis of bilateral carotid arteries: Secondary | ICD-10-CM | POA: Diagnosis not present

## 2016-01-13 ENCOUNTER — Ambulatory Visit (INDEPENDENT_AMBULATORY_CARE_PROVIDER_SITE_OTHER): Payer: 59 | Admitting: Internal Medicine

## 2016-01-13 ENCOUNTER — Encounter: Payer: Self-pay | Admitting: Internal Medicine

## 2016-01-13 ENCOUNTER — Other Ambulatory Visit (INDEPENDENT_AMBULATORY_CARE_PROVIDER_SITE_OTHER): Payer: 59

## 2016-01-13 VITALS — BP 130/60 | HR 60 | Temp 97.8°F | Resp 18 | Ht 76.0 in | Wt 234.0 lb

## 2016-01-13 DIAGNOSIS — E041 Nontoxic single thyroid nodule: Secondary | ICD-10-CM | POA: Diagnosis not present

## 2016-01-13 DIAGNOSIS — E538 Deficiency of other specified B group vitamins: Secondary | ICD-10-CM

## 2016-01-13 DIAGNOSIS — E119 Type 2 diabetes mellitus without complications: Secondary | ICD-10-CM

## 2016-01-13 LAB — LIPID PANEL
CHOL/HDL RATIO: 3
Cholesterol: 118 mg/dL (ref 0–200)
HDL: 38.9 mg/dL — ABNORMAL LOW (ref >39.00)
LDL CALC: 61 mg/dL (ref 0–99)
NONHDL: 79.28
Triglycerides: 92 mg/dL (ref 0.0–149.0)
VLDL: 18.4 mg/dL (ref 0.0–40.0)

## 2016-01-13 LAB — COMPREHENSIVE METABOLIC PANEL
ALK PHOS: 27 U/L — AB (ref 39–117)
ALT: 36 U/L (ref 0–53)
AST: 23 U/L (ref 0–37)
Albumin: 4.1 g/dL (ref 3.5–5.2)
BUN: 21 mg/dL (ref 6–23)
CHLORIDE: 104 meq/L (ref 96–112)
CO2: 29 meq/L (ref 19–32)
Calcium: 9.2 mg/dL (ref 8.4–10.5)
Creatinine, Ser: 0.96 mg/dL (ref 0.40–1.50)
GFR: 84.17 mL/min (ref >60.00)
GLUCOSE: 163 mg/dL — AB (ref 70–99)
POTASSIUM: 4.5 meq/L (ref 3.5–5.1)
SODIUM: 139 meq/L (ref 135–145)
TOTAL PROTEIN: 6.9 g/dL (ref 6.0–8.3)
Total Bilirubin: 1.2 mg/dL (ref 0.2–1.2)

## 2016-01-13 LAB — VITAMIN D 25 HYDROXY (VIT D DEFICIENCY, FRACTURES): VITD: 51.67 ng/mL (ref 30.00–100.00)

## 2016-01-13 LAB — HEMOGLOBIN A1C: HEMOGLOBIN A1C: 6.6 % — AB (ref 4.6–6.5)

## 2016-01-13 LAB — VITAMIN B12: Vitamin B-12: 639 pg/mL (ref 211–911)

## 2016-01-13 NOTE — Patient Instructions (Signed)
Think about doing a second consultation with Dr. Buddy Duty about the thyroid biopsy to see what he thinks about it.  We are checking the labs today and will call you back with the results.

## 2016-01-13 NOTE — Progress Notes (Signed)
Pre visit review using our clinic review tool, if applicable. No additional management support is needed unless otherwise documented below in the visit note. 

## 2016-01-13 NOTE — Assessment & Plan Note (Signed)
Have recommended that he see a second endocrinologist to see if he needs that testing or surgery or at least ongoing monitoring for his thyroid nodule with cancerous cells. He declines that at this time and does not want to pursue additional treatment right now.

## 2016-01-13 NOTE — Assessment & Plan Note (Signed)
Checking HgA1c, lipid, CMP today. Taking metformin XR daily. Not on ACE-I or ARB. Not complicated but does have ongoing neuropathy after his ruptured thoracic aneurysm.

## 2016-01-13 NOTE — Progress Notes (Signed)
   Subjective:    Patient ID: [name], male    DOB: 1950/09/10, 65 y.o.   MRN: [id number]  HPI The patient is a 65 YO man coming in for follow up of several medical conditions including his recent thyroid biopsy (found some cancerous cells, he was advised to have another test which is $3600 to help differentiate his risk of it to decide if he needs surgery) and his diabetes (on metformin daily, had monitored his sugars and they were normal, not checking now) and his blood pressure (doing well overall at home on his metoprolol, denies chest pains or headaches). Some ongoing concerns including his fatigue (tried starting B12 and is feeling some better but worse with exposure to mold).   Review of Systems  Constitutional: Positive for fatigue. Negative for activity change, appetite change, fever and unexpected weight change.  Eyes: Negative.   Respiratory: Negative for cough, chest tightness, shortness of breath and wheezing.   Cardiovascular: Negative for chest pain, palpitations and leg swelling.  Gastrointestinal: Negative.   Musculoskeletal: Positive for arthralgias and myalgias. Negative for gait problem.  Skin: Negative.   Neurological: Positive for numbness and headaches. Negative for tremors, weakness and light-headedness.  Psychiatric/Behavioral: Positive for dysphoric mood and sleep disturbance. The patient is nervous/anxious.       Objective:   Physical Exam  Constitutional: He is oriented to person, place, and time. He appears well-developed and well-nourished.  HENT:  Head: Normocephalic and atraumatic.  Eyes: EOM are normal.  Neck: Normal range of motion.  Cardiovascular: Normal rate.   Murmur heard. Pulmonary/Chest: Effort normal and breath sounds normal. No respiratory distress. He has no wheezes. He has no rales.  Abdominal: Soft. Bowel sounds are normal. He exhibits no distension. There is no tenderness.  Musculoskeletal: He exhibits no edema.  Neurological: He  is alert and oriented to person, place, and time. Coordination normal.  Skin: Skin is warm and dry.  Psychiatric: He has a normal mood and affect. His behavior is normal.   Vitals:   01/13/16 0933 01/13/16 1015  BP: (!) 150/58 130/60  Pulse: 60   Resp: 18   Temp: 97.8 F (36.6 C)   TempSrc: Oral   SpO2: 97%   Weight: 234 lb (106.1 kg)   Height: 6\' 4"  (1.93 m)       Assessment & Plan:

## 2016-01-19 MED FILL — POLYETHYLENE GLYCOL 3350 PO: 30 days supply | Qty: 527 | Fill #5

## 2016-01-19 MED FILL — ALPRAZolam 1 MG TABS: 1 | 30 days supply | Qty: 60 | Fill #1

## 2016-01-26 NOTE — Progress Notes (Signed)
Patient ID: [name], male   DOB: 23-Apr-1951, 65 y.o.   MRN: [id number]   65 y.o. with emergent type A disection repair 02/19/12 by PVT. Post op course complicated by DVT on 3 month course of coumadin.Previous smoker but has not restarted since surgery. Surgery 02/19/12 Status post repair of type A ascending dissection with Heaney shield graft used to replace ascending aorta and hemi-arch reconstruction, resuspension of aortic valve Had a residual 6cm infrarenal AAA Since surgery has  paresthesias in 4th and 5th digits on right hand Likely brachial plexus injury with axillary artery cannulation on right side.Still with tingling in fingers and bicipital weakness   Discussed the resolution of his pericardial effusion and ongoing need to monitor his resuspended AV   Had staged  AAA repair by Dr Kellie Simmering 10/2012  F/U venous duplex of LLE today showed resolution of thrombus and no DVT on left  Coumadin stopped   Echo 04/13/12 Normal EF and no AR  Study Conclusions  - Left ventricle: The cavity size was normal. Wall thickness was increased in a pattern of mild LVH. Systolic function was normal. The estimated ejection fraction was in the range of 60% to 65%. Septal bounce possible consistent with prior surgery. Wall motion was normal; there were no regional wall motion abnormalities. - Aortic valve: There was no stenosis. - Aorta: Mildly dilated aortic root. Aortic root dimension:41mm (ED). - Mitral valve: Mildly calcified annulus. Trivial regurgitation. - Left atrium: The atrium was mildly dilated. - Right ventricle: The cavity size was normal. Systolic function was normal. - Right atrium: The atrium was mildly dilated. - Pulmonary arteries: No complete TR doppler jet so unable to estimate PA systolic pressure. - Inferior vena cava: The vessel was normal in size; the respirophasic diameter changes were in the normal range (= 50%); findings are consistent with normal central  venous Pressure.  Reviewed CT done 10/15 and residual distal thoracic dissection stable  Stable appearance of type B thoracic aortic dissection beginning in the distal portion of the descending thoracic aorta and extending into the proximal abdominal aorta. The celiac, superior mesenteric and right renal artery all arise from the true lumen. Dissection flap extends into the proximal portion the left renal artery which is stable compared to prior exam. Symmetric enhancement of both kidneys is noted suggesting no significant impaired flow the either kidney.  Status post surgical repair of infrarenal abdominal aortic aneurysm with no evidence of endoleak.  Spent over 30 minutes discussing his aortic issues.  Patient was scarred by Dr Kellie Simmering about "retrograde flow"  Discussed issues of false lumen and need for surveillance Also discussed benefits of beta blocker to minimize change in residual aortic size    05/06/14  Myovue reviewed Normal no ischemia or infarct 04/15/14 Echo reviewed Normal EF 60-65% mild biatrial enlargement no valve disease   Job may be reclassified since less research being done at [facility] Does statistics for docs  ROS: Denies fever, malais, weight loss, blurry vision, decreased visual acuity, cough, sputum, SOB, hemoptysis, pleuritic pain, palpitaitons, heartburn, abdominal pain, melena, lower extremity edema, claudication, or rash.  All other systems reviewed and negative  General: Affect appropriate Healthy:  appears stated age 18: normal Neck supple with no adenopathy JVP normal no bruits no thyromegaly Lungs clear with no wheezing and good diaphragmatic motion Heart:  S1/S2 SEM no AR murmur, no rub, gallop or click PMI normal Abdomen: benighn, BS positve, no tenderness, post AAA repair  no bruit.  No HSM or  HJR Distal pulses intact with no bruits No edema Neuro non-focal Skin warm and dry No muscular weakness   Current Outpatient Prescriptions   Medication Sig Dispense Refill  . ALPRAZolam (XANAX) 1 MG tablet TAKE 1/2 TABLET BY MOUTH FOUR TIMES DAILY 60 tablet 1  . aspirin EC 81 MG tablet Take 81 mg by mouth daily with lunch.    [name] azelastine (ASTELIN) 0.1 % nasal spray PLACE 2 SPRAYS INTO BOTH NOSTRILS 2 (TWO) TIMES DAILY. 30 mL 2  . calcium carbonate (TUMS EX) 750 MG chewable tablet Chew 1 tablet by mouth daily as needed for heartburn.     . cholecalciferol (VITAMIN D) 1000 UNITS tablet Take 2,000 Units by mouth daily.    [name] EPIPEN 2-PAK 0.3 MG/0.3ML SOAJ injection Inject 0.3 mLs (0.3 mg total) into the muscle once. 2 Device 3  . fexofenadine (ALLEGRA) 180 MG tablet Take 90 mg by mouth 2 (two) times daily.     . fluticasone (FLONASE) 50 MCG/ACT nasal spray PLACE 2 SPRAYS INTO BOTH NOSTRILS DAILY. 16 g 5  . metFORMIN (GLUCOPHAGE-XR) 500 MG 24 hr tablet Take 500 mg by mouth daily with supper.   3  . metoprolol tartrate (LOPRESSOR) 25 MG tablet Take 1 tablet (25 mg total) by mouth 3 (three) times daily. 270 tablet 3  . montelukast (SINGULAIR) 10 MG tablet Take 5 mg by mouth 2 (two) times daily.    . Multiple Vitamin (MULTIVITAMIN WITH MINERALS) TABS Take 1 tablet by mouth daily.    [name] omega-3 acid ethyl esters (LOVAZA) 1 g capsule TAKE 1 CAPSULE BY MOUTH 2 TIMES DAILY. 180 capsule 3  . OVER THE COUNTER MEDICATION Apply 1 application topically daily as needed (for acne). Oxy 10 vanishing cream    . polyethylene glycol powder (GLYCOLAX/MIRALAX) powder MIX 17G WITH LIQUID AND DRINK BY MOUTH DAILY. 527 g 5  . predniSONE (DELTASONE) 10 MG tablet TAKE 2 TABLETS BY MOUTH DAILY AS NEEDED FOR ASTHMA FLARE-UP 10 tablet 0  . rosuvastatin (CRESTOR) 10 MG tablet Take 1 tablet (10 mg total) by mouth daily. 30 tablet 0  . thiamine (VITAMIN B-1) 100 MG tablet Take 100 mg by mouth 2 (two) times daily at 10 AM and 5 PM.     . vitamin B-12 (CYANOCOBALAMIN) 500 MCG tablet Take 500-1,000 mcg by mouth daily.      No current facility-administered medications  for this visit.     Allergies  Testosterone; Codeine; Gabapentin; Ultram [tramadol]; and Albuterol  Electrocardiogram:  11/17/12 SR rate 68 normal ECG   04/15/14 SR no acute changes compared to June  07/04/15  SR rate 64 normal   Assessment and Plan Dissection:  Grafted aortic root  F/u echo in a year AV resuspended no change in SEM no AR murmur AAA:  F/U CT per Dr Kellie Simmering for false lumen Chest Pain Resolved normal myovue 2015 DM Discussed low carb diet.  Target hemoglobin A1c is 6.5 or less.  Continue current medications. Chol:    Cholesterol is at goal.  Continue current dose of statin and diet Rx.  No myalgias or side effects.  F/U  LFT's in 6 months. Lab Results  Component Value Date   [place] 61 01/13/2016   DVT:   Resolved by Duplex off anticoagulaiton related to vascular surgery  Bruit:  01/09/16 duplex reviewed plaque no stenosis f/u 01/08/18 Cognitive:  Some issues with memory ? Migrainous aura f/u neuro  [place]

## 2016-01-27 ENCOUNTER — Ambulatory Visit (INDEPENDENT_AMBULATORY_CARE_PROVIDER_SITE_OTHER): Payer: 59 | Admitting: Cardiovascular Disease

## 2016-01-27 ENCOUNTER — Encounter: Payer: Self-pay | Admitting: Cardiovascular Disease

## 2016-01-27 VITALS — BP 120/50 | HR 59 | Ht 76.0 in | Wt 236.8 lb

## 2016-01-27 DIAGNOSIS — I359 Nonrheumatic aortic valve disorder, unspecified: Secondary | ICD-10-CM

## 2016-01-27 DIAGNOSIS — R011 Cardiac murmur, unspecified: Secondary | ICD-10-CM

## 2016-01-27 NOTE — Patient Instructions (Addendum)
Medication Instructions:  Your physician recommends that you continue on your current medications as directed. Please refer to the Current Medication list given to you today.  Labwork: NONE  Testing/Procedures: Your physician has requested that you have an echocardiogram in a year. Echocardiography is a painless test that uses sound waves to create images of your heart. It provides your doctor with information about the size and shape of your heart and how well your heart's chambers and valves are working. This procedure takes approximately one hour. There are no restrictions for this procedure.  Follow-Up: Your physician wants you to follow-up in: 12 months with Dr. Johnsie Cancel. You will receive a reminder letter in the mail two months in advance. If you don't receive a letter, please call our office to schedule the follow-up appointment.   If you need a refill on your cardiac medications before your next appointment, please call your pharmacy.

## 2016-01-28 ENCOUNTER — Ambulatory Visit (INDEPENDENT_AMBULATORY_CARE_PROVIDER_SITE_OTHER): Payer: 59 | Admitting: Internal Medicine

## 2016-01-28 DIAGNOSIS — Z9189 Other specified personal risk factors, not elsewhere classified: Secondary | ICD-10-CM

## 2016-01-28 DIAGNOSIS — Z23 Encounter for immunization: Secondary | ICD-10-CM

## 2016-01-28 MED ORDER — AZITHROMYCIN 500 MG PO TABS: 500.0000 mg | ORAL_TABLET | Freq: Every day | ORAL | 0 refills | Status: DC

## 2016-01-28 MED FILL — AZITHROMYCIN 500 MG TABLET: 500 | 5 days supply | Qty: 5 | Fill #0

## 2016-01-28 NOTE — Patient Instructions (Signed)
Remember to get TDAP vaccine through employee health in september

## 2016-01-28 NOTE — Progress Notes (Signed)
RFV: pretravel counseling for 7 day trip to [place]:    Patient ID: [name], male    DOB: 28-Mar-1951, 65 y.o.   MRN: [id number]  HPI 65yo M with hx of AAA, vascular disease, DM2 who will be going to visit his broter in [place] for 7 days in November 2017. The patient has had flu vaccine last year, received tdap in 2007. No prior hx of outside [place] travel.  Allergies  Allergen Reactions  . Testosterone Shortness Of Breath    Medication:Androgel Pt states trouble breathing at night  . Codeine Nausea And Vomiting  . Gabapentin     "drunk" feeling  . Ultram [Tramadol] Nausea And Vomiting  . Albuterol Other (See Comments)    Panic attack   Current Outpatient Prescriptions on File Prior to Visit  Medication Sig Dispense Refill  . ALPRAZolam (XANAX) 1 MG tablet TAKE 1/2 TABLET BY MOUTH FOUR TIMES DAILY 60 tablet 1  . aspirin EC 81 MG tablet Take 81 mg by mouth daily with lunch.    [name] azelastine (ASTELIN) 0.1 % nasal spray PLACE 2 SPRAYS INTO BOTH NOSTRILS 2 (TWO) TIMES DAILY. 30 mL 2  . calcium carbonate (TUMS EX) 750 MG chewable tablet Chew 1 tablet by mouth daily as needed for heartburn.     . cholecalciferol (VITAMIN D) 1000 UNITS tablet Take 2,000 Units by mouth daily.    [name] EPIPEN 2-PAK 0.3 MG/0.3ML SOAJ injection Inject 0.3 mLs (0.3 mg total) into the muscle once. 2 Device 3  . fexofenadine (ALLEGRA) 180 MG tablet Take 90 mg by mouth 2 (two) times daily.     . fluticasone (FLONASE) 50 MCG/ACT nasal spray PLACE 2 SPRAYS INTO BOTH NOSTRILS DAILY. 16 g 5  . metFORMIN (GLUCOPHAGE-XR) 500 MG 24 hr tablet Take 500 mg by mouth daily with supper.   3  . metoprolol tartrate (LOPRESSOR) 25 MG tablet Take 1 tablet (25 mg total) by mouth 3 (three) times daily. 270 tablet 3  . montelukast (SINGULAIR) 10 MG tablet Take 5 mg by mouth 2 (two) times daily.    . Multiple Vitamin (MULTIVITAMIN WITH MINERALS) TABS Take 1 tablet by mouth daily.    [name] omega-3 acid ethyl  esters (LOVAZA) 1 g capsule TAKE 1 CAPSULE BY MOUTH 2 TIMES DAILY. 180 capsule 3  . OVER THE COUNTER MEDICATION Apply 1 application topically daily as needed (for acne). Oxy 10 vanishing cream    . polyethylene glycol powder (GLYCOLAX/MIRALAX) powder MIX 17G WITH LIQUID AND DRINK BY MOUTH DAILY. 527 g 5  . predniSONE (DELTASONE) 10 MG tablet TAKE 2 TABLETS BY MOUTH DAILY AS NEEDED FOR ASTHMA FLARE-UP 10 tablet 0  . rosuvastatin (CRESTOR) 10 MG tablet Take 1 tablet (10 mg total) by mouth daily. 30 tablet 0  . thiamine (VITAMIN B-1) 100 MG tablet Take 100 mg by mouth 2 (two) times daily at 10 AM and 5 PM.     . vitamin B-12 (CYANOCOBALAMIN) 500 MCG tablet Take 500-1,000 mcg by mouth daily.     . [DISCONTINUED] oxyCODONE (OXY IR/ROXICODONE) 5 MG immediate release tablet Take 1 tablet (5 mg total) by mouth every 4 (four) hours as needed. 30 tablet 0   No current facility-administered medications on file prior to visit.    Active Ambulatory Problems    Diagnosis Date Noted  . Vitamin D deficiency 12/17/2008  . Hyperlipidemia 12/17/2008  . OBSTRUCTIVE SLEEP APNEA 12/17/2008  . Migraine 12/17/2008  . ALLERGIC RHINITIS 12/17/2008  .  ASTHMA 12/17/2008  . ERECTILE DYSFUNCTION, ORGANIC 12/17/2008  . Injury of right brachial plexus 03/15/2012  . AAA (abdominal aortic aneurysm) ([place]) 03/17/2012  . Occlusion and stenosis of carotid artery without mention of cerebral infarction 04/11/2012  . Anxiety   . Syncope   . Brachial plexopathy   . DM2 (diabetes mellitus, type 2) ([place])   . Peripheral neuropathy ([place]) 03/21/2013  . Leg pain, bilateral 05/15/2013  . Left carotid bruit 12/12/2013  . S/P aortic dissection repair 04/02/2014  . Transient amnesia 08/26/2014  . MCI (mild cognitive impairment) 08/26/2014  . Benign paroxysmal positional vertigo 08/26/2014  . AAA (abdominal aortic aneurysm) without rupture ([place]) 04/15/2015  . Thyroid nodule 04/21/2015  . Routine general medical examination at a  health care facility 07/18/2015   Resolved Ambulatory Problems    Diagnosis Date Noted  . FATIGUE 12/17/2008  . CHICKENPOX, HX OF 12/17/2008  . Hypogonadism male 02/11/2011  . Long term (current) use of anticoagulants 03/09/2012  . Acute venous embolism and thrombosis of deep vessels of proximal lower extremity ([place]) 03/09/2012  . Rash 03/14/2012  . Dissecting aortic aneurysm, thoracic ([place]) 02/20/2012  . Hyperglycemia   . Chest pain 04/30/2014  . Routine general medical examination at a health care facility 07/13/2014  . Dissection of thoracoabdominal aorta ([place]) 04/15/2015  . Folliculitis [id number]  . Papule of skin 09/22/2015   Past Medical History:  Diagnosis Date  . AAA (abdominal aortic aneurysm) ([place])   . ALLERGIC RHINITIS   . Anemia   . Anxiety   . Arthritis   . Brachial plexopathy   . COPD (chronic obstructive pulmonary disease) ([place])   . Dissecting aortic aneurysm, thoracic ([place]) 02/20/2012  . DM2 (diabetes mellitus, type 2) ([place])   . DVT (deep venous thrombosis) ([place])   . ERECTILE DYSFUNCTION, ORGANIC   . GERD (gastroesophageal reflux disease)   . HYPERLIPIDEMIA   . Hypertension   . MIGRAINE HEADACHE   . OBSTRUCTIVE SLEEP APNEA 02/2008 sleep study  . Thyroid nodule 04/21/2015  . Unspecified vitamin D deficiency    Social History  Substance Use Topics  . Smoking status: Former Smoker    Packs/day: 0.10    Types: Cigarettes    Quit date: 02/18/2012  . Smokeless tobacco: Never Used     Comment: 10 cigs/day, quit 02/2012 after TAA emergent repair  . Alcohol use 0.0 oz/week     Comment: shot of Tequila every day      Review of Systems     Objective:   Physical Exam      Assessment & Plan:  Pre travel vaccination = recommend that he get hep A #1, and typhoid injection today. He is also due for tdap booster in September for which we will remind him to get at employee health  Traveler's diarrhea = gave precautions and also rx for azithromycin to use if  needed  Mosquito bite prevention = recommend to use deet and premethrin spray

## 2016-02-09 ENCOUNTER — Other Ambulatory Visit: Payer: Self-pay | Admitting: Internal Medicine

## 2016-02-09 MED FILL — METOPROLOL TARTRATE 25 MG T: 25 | 90 days supply | Qty: 270 | Fill #2

## 2016-02-09 MED FILL — METFORMIN HCL ER 500 MG TAB: 500 | 90 days supply | Qty: 90 | Fill #0

## 2016-02-09 MED FILL — AZELASTINE HCL 137 MCG SPRY: 0.1 | 50 days supply | Qty: 30 | Fill #1

## 2016-02-10 ENCOUNTER — Encounter: Payer: Self-pay | Admitting: Neurology

## 2016-02-10 ENCOUNTER — Ambulatory Visit (INDEPENDENT_AMBULATORY_CARE_PROVIDER_SITE_OTHER): Payer: 59 | Admitting: Neurology

## 2016-02-10 VITALS — BP 146/60 | HR 68 | Resp 20 | Ht 76.0 in | Wt 231.0 lb

## 2016-02-10 DIAGNOSIS — G3184 Mild cognitive impairment, so stated: Secondary | ICD-10-CM | POA: Diagnosis not present

## 2016-02-10 DIAGNOSIS — G609 Hereditary and idiopathic neuropathy, unspecified: Secondary | ICD-10-CM | POA: Diagnosis not present

## 2016-02-10 NOTE — Progress Notes (Signed)
PATIENT: [name] DOB: [birth year]  REASON FOR VISIT: follow up- cognitive impairment, brachial plexopathy HISTORY FROM: patient  HISTORY OF PRESENT ILLNESS:  02-10-2016 [name] is a 65 year old male with a history of mild cognitive impairment and brachial plexopathy.  The patient did go to neuropsychological testing. His results indicated that there may be several causes for his cognitive weaknesses such as anxious mood, obstructive sleep apnea, fatigue and possible hypoxic encephalopathy due to surgery in 2013.  Dr. Valentina Shaggy also recommended considering treatment with CPAP again. He also felt that he would benefit from on mindful based stress reduction training or other relaxation techniques. He also felt that a possible stimulant could be beneficial.The patient feels that he continues to have issues with his thought process.  He feels at times as if he has an aura to a migraine but the headache never follows. He also has stated that sometimes he reports a sentence and cannot really extract the demeaning and repeated reading will actually increase his frustration- but not his understanding. If he can take a step back and approach the question anew, he is usually doing much better with it.  He states that for example he was sending an email out at work and was trying to find a person in his contacts that was gone from the position for 20 years. He states that there has also been occasions where he has put it Mucinex in his cereal. He has been stunned by misperceptions of his- the chest-pocket on his shirt being left sided, struck him as odd. He noted all other shirts to have the same  Shirt pocket, and only than felt OK about it. He feels sometimes unable to assemble a card-box, a simple visual spatial context problem became a hindrance.  He continues to take Xanax at night and feels that that helped with his anxiety. He has tried trazodone in the past but could not tolerate this medication  as he felt that it caused his mind to race.  [name] also reports a new foot pain in the center of the  left heel, burning in the calls and other feet and toe pain have also been more frequent, so I do consider that he may progress in his  peripheral neuropathy. It was also mentioned to him that he may half planta fasciitis, for which the exercises would be helpful especially stretching the Achillis tendon and dorsi flexion of the foot.   HISTORY 03/04/15: [name] is a 65 year old male with a history of mild cognitive impairment and brachial plexopathy. He returns today for follow-up. The patient stopped the amitriptyline however he has not noticed any changes in his symptoms. Although he does not feel that amitriptyline was helpful for his migraines nor his neuropathy. He states he still having episodes where he feels confused, can't separate his thoughts. Also having some comprehension issues and difficulty with decision making. Patient also states that he has not been sleeping well. He uses Xanax to help him sleep at night. He states occasionally he will have to take a second dose in the middle the night. He states that if he does wake up he typically has a hard time falling back asleep. Patient states that his migraines have continued but with less frequency. He states that he has 6-8 migraines a month. He has been tried on gabapentin but could not tolerate this medication. The patient also has a history of sleep apnea but could not tolerate the fascial mask nor  nasal pillows. Patient reports that he is no longer doing the exercises on the right hand that was taught to him in rehabilitation for his brachial plexopathy.   HISTORY  (Kruti Horacek): [name] is a 65 y.o. male Is seen here as a referral by Dr Nils Pyle after reporting confusional episodes.  Formerly seen from Dr. Asa Lente for Brachial neuropathy, 08-26-14 Periods are episodes during which he feels confused, slightly  disoriented to Time frame, he feels that he has trouble differentiating if he is dreaming, if he is awake, is in a memory . He reports also that his behaviors have changed but he cannot really explain those. #1 his leaving cabinet doors open which is not his habit usually. He misplaces things frequently and gets panicky when not able to locate them. Also there is a difficulty with abstraction. He mentioned that he hurt somebody referred to a saying ; "I've been there, done that and I brought the T-shirt " he could not understand what the "bought the T-shirt "part was about. He also has noticed that he sometimes gets off the elevator in the hospital and is not sure if he has to go left or right- it takes him a while to reorient himself. He also mentioned an event when he tried to and was preparing to mow his lawn, he had to fill the lawnmower with gasoline and held the petroleum can cap In one hand -he forgot what it was used for- until he realized that this was cap that closed the petroleum can. He has also trouble with recognize him some word sometimes in mid sentence sometimes word that he is just reading sometimes words he wants to use and speak. He is less comfortbale with change and new situations, any divergence form his routine.  Amnestic episodes. Family history positive for a mother with dementia in her 14 's. He spoke to his sister, and she has some symptoms , too. He is weaning off amitriptyline.     Last visit note 8- 2014 CD.  [name] is a 65 year old Caucasian gentleman, who underwent surgery for an aortic dissection in September 2013. Following the surgery he had first some numbness and weakness involving the right upper extremity. The patient had experienced shortly at the improvement since that time when he reported that he still had some residual numbness of the dominant hand as well as some noticeable weakness no she had no discomfort associated no pain. I referred the  patient for I nerve conduction study with EMG which was performed on 12-20-12 by Dr. Floyde Parkins. The patient's blood conduction studies and EMGs confirm a proximal right median neuropathy of the brachial plexus, located lately at the ligament of Struthers. The changes were already chronic and moderate to severe. A cervical radiculopathy and impingement of the exiting nerves was not noted it is likely that the patient suffered a so-called double crush syndrome doing has had surgery. It is important to note that the ulnar and radial nerve is of the right extremity are unaffected. [name] reports some tingling and he extends his right upper extremity which is a new phenomenon. It is slow not troubling him very much but it is noticeable. The area of dysesthesia affects the skin dermatome. It extends from the medial biceps through the antebrachial region into fingers of his right hand. He had questions about using amitriptyline to treat the tingling numbness. It will also help with sleep initiation and amitriptyline is used in patients with neck pain  as well as neuropathy. His neuropathy seems also to be related to the surgery and possibly the anesthesia perhaps the loss of blood flow.  The patient reports that his sudden loss of awareness spells that he had suffered around the time of the surgery has not occurred at least for the last four to six weeks. I have asked him to try to avoid decongestants. An EEG performed on 06-1612 was normal.    REVIEW OF SYSTEMS: Out of a complete 14 system review of symptoms, the patient complains only of the following symptoms, and all other reviewed systems are negative.  Fatigue, eye itching, light sensitivity, shortness of breath, chest pain, insomnia, frequent waking, environmental allergies, difficulty urinating, frequency of urination, dizziness, confusion, decreased concentration, nervous/anxious  ALLERGIES: Allergies  Allergen Reactions  . Testosterone  Shortness Of Breath    Medication:Androgel Pt states trouble breathing at night  . Codeine Nausea And Vomiting  . Gabapentin     "drunk" feeling  . Ultram [Tramadol] Nausea And Vomiting  . Albuterol Other (See Comments)    Panic attack    HOME MEDICATIONS: Outpatient Medications Prior to Visit  Medication Sig Dispense Refill  . ALPRAZolam (XANAX) 1 MG tablet TAKE 1/2 TABLET BY MOUTH FOUR TIMES DAILY 60 tablet 1  . aspirin EC 81 MG tablet Take 81 mg by mouth daily with lunch.    [name] azelastine (ASTELIN) 0.1 % nasal spray PLACE 2 SPRAYS INTO BOTH NOSTRILS 2 (TWO) TIMES DAILY. 30 mL 2  . azithromycin (ZITHROMAX) 500 MG tablet Take 1 tablet (500 mg total) by mouth daily. If you have 3+loose stools/24hr. Can stop taking if diarrhea resolves 5 tablet 0  . calcium carbonate (TUMS EX) 750 MG chewable tablet Chew 1 tablet by mouth daily as needed for heartburn.     . cholecalciferol (VITAMIN D) 1000 UNITS tablet Take 2,000 Units by mouth daily.    [name] EPIPEN 2-PAK 0.3 MG/0.3ML SOAJ injection Inject 0.3 mLs (0.3 mg total) into the muscle once. 2 Device 3  . fexofenadine (ALLEGRA) 180 MG tablet Take 90 mg by mouth 2 (two) times daily.     . fluticasone (FLONASE) 50 MCG/ACT nasal spray PLACE 2 SPRAYS INTO BOTH NOSTRILS DAILY. 16 g 5  . metFORMIN (GLUCOPHAGE-XR) 500 MG 24 hr tablet Take 500 mg by mouth daily with supper.   3  . metFORMIN (GLUCOPHAGE-XR) 500 MG 24 hr tablet TAKE 1 TABLET BY MOUTH DAILY WITH BREAKFAST. 90 tablet 3  . metoprolol tartrate (LOPRESSOR) 25 MG tablet Take 1 tablet (25 mg total) by mouth 3 (three) times daily. 270 tablet 3  . montelukast (SINGULAIR) 10 MG tablet Take 5 mg by mouth 2 (two) times daily.    . Multiple Vitamin (MULTIVITAMIN WITH MINERALS) TABS Take 1 tablet by mouth daily.    [name] omega-3 acid ethyl esters (LOVAZA) 1 g capsule TAKE 1 CAPSULE BY MOUTH 2 TIMES DAILY. 180 capsule 3  . OVER THE COUNTER MEDICATION Apply 1 application topically daily as needed (for acne). Oxy  10 vanishing cream    . polyethylene glycol powder (GLYCOLAX/MIRALAX) powder MIX 17G WITH LIQUID AND DRINK BY MOUTH DAILY. 527 g 5  . predniSONE (DELTASONE) 10 MG tablet TAKE 2 TABLETS BY MOUTH DAILY AS NEEDED FOR ASTHMA FLARE-UP 10 tablet 0  . rosuvastatin (CRESTOR) 10 MG tablet Take 1 tablet (10 mg total) by mouth daily. 30 tablet 0  . thiamine (VITAMIN B-1) 100 MG tablet Take 100 mg by mouth 2 (two) times daily at  10 AM and 5 PM.     . vitamin B-12 (CYANOCOBALAMIN) 500 MCG tablet Take 500-1,000 mcg by mouth daily.      No facility-administered medications prior to visit.     PAST MEDICAL HISTORY: Past Medical History:  Diagnosis Date  . AAA (abdominal aortic aneurysm) ([place])   . ALLERGIC RHINITIS   . Anemia    post op, treated /w Fe  . Anxiety   . Arthritis    OA, hands, - thumbs- specifically  . Brachial plexopathy    double crush injury with median nerve damage on the right dominant hand.   [name] COPD (chronic obstructive pulmonary disease) ([place])    also states he has been told that he has slight emphysema   . Dissecting aortic aneurysm, thoracic ([place]) 02/20/2012   s/p emergent surgical repair  . DM2 (diabetes mellitus, type 2) ([place])   . DVT (deep venous thrombosis) ([facility])    coumadin- x3 months , post op  . ERECTILE DYSFUNCTION, ORGANIC   . GERD (gastroesophageal reflux disease)    related to diet & anxiety   . HYPERLIPIDEMIA   . Hypertension    pt. followwed by Dr. Johnsie Cancel  . MIGRAINE HEADACHE    induced from physical , emotional stress , but sometimes occur randomly, also experiences BPPV  . OBSTRUCTIVE SLEEP APNEA 02/2008 sleep study   moderate & central- not able to tolerate CPAP   . Thyroid nodule 04/21/2015   2.3 cm on right, incidental finding on 03/2015 CT chest - f/u US ordered  . Unspecified vitamin D deficiency     PAST SURGICAL HISTORY: Past Surgical History:  Procedure Laterality Date  . ABDOMINAL AORTIC ANEURYSM REPAIR N/A 11/01/2012   Procedure: ANEURYSM  ABDOMINAL AORTIC REPAIR;  Surgeon: Mal Misty, MD;  Location: [facility];  Service: Vascular;  Laterality: N/A;  Resection and Grafting of Abdominal Aortic Aneurysm,(AORTA BI ILIAC)  . Nasal fx without repair    . THORACIC AORTIC ANEURYSM REPAIR  02/20/2012   Procedure: THORACIC ASCENDING ANEURYSM REPAIR (AAA);  Surgeon: Ivin Poot, MD;  Location: [facility];  Service: Open Heart Surgery;  Laterality: N/A;    FAMILY HISTORY: Family History  Problem Relation Age of Onset  . Diabetes Mother   . Stroke Mother   . Dementia Mother   . Deep vein thrombosis Father     PE  . Varicose Veins Father   . Heart disease Maternal Grandmother   . Heart disease Maternal Uncle   . Heart disease Paternal Uncle   . Heart disease Cousin   . Lung cancer Cousin     2 cousins    SOCIAL HISTORY: Social History   Social History  . Marital status: Divorced    Spouse name: N/A  . Number of children: 2  . Years of education: acad. deg.   Occupational History  . internal med resident [place] History Main Topics  . Smoking status: Former Smoker    Packs/day: 0.10    Types: Cigarettes    Quit date: 02/18/2012  . Smokeless tobacco: Never Used     Comment: 10 cigs/day, quit 02/2012 after TAA emergent repair  . Alcohol use 0.0 oz/week     Comment: shot of Tequila every day   . Drug use: No  . Sexual activity: Not on file   Other Topics Concern  . Not on file   Social History Narrative   Patient is single and lives at home alone.  Arboriculturist.   Patient works at [employer] full time.   Right handed.   Caffeine one cup of coffee daily.       PHYSICAL EXAM  Vitals:   02/10/16 1434  BP: (!) 146/60  Pulse: 68  Resp: 20  Weight: 231 lb (104.8 kg)  Height: 6\' 4"  (1.93 m)   Body mass index is 28.12 kg/m.  Generalized: Well developed, in no acute distress   Neurological examination  Mentation: Alert oriented to time, place, history taking. Montreal Cognitive Assessment   02/10/2016  Visuospatial/ Executive (0/5) 5  Naming (0/3) 3  Attention: Read list of digits (0/2) 2  Attention: Read list of letters (0/1) 1  Attention: Serial 7 subtraction starting at 100 (0/3) 2  Language: Repeat phrase (0/2) 1  Language : Fluency (0/1) 1  Abstraction (0/2) 2  Delayed Recall (0/5) 2  Orientation (0/6) 6  Total 25  Adjusted Score (based on education) 25    Follows all commands speech and language fluent Cranial nerve :  Pupils were equal round reactive to light. Extraocular movements were full, visual field were full on confrontational test.  Facial sensation and strength were normal. Uvula and  tongue midline. Head turning and shoulder shrug were symmetric. Motor: 5 / 5 strength of all 4 extremities, symmetric motor tone is noted throughout.  Sensory: Sensory testing is intact to soft touch on all 4 extremities. No evidence of extinction is noted.  Coordination: finger-nose-finger and heel-to-shin intact bilaterally.  Gait and station: Gait is normal. Tandem gait is unsteady.  Reflexes: Deep tendon reflexes are symmetric, bilaterally.   DIAGNOSTIC DATA (LABS, IMAGING, TESTING) - I reviewed patient records, labs, notes, testing and imaging myself where available.  Lab Results  Component Value Date   WBC 7.0 07/16/2015   HGB 15.1 07/16/2015   HCT 45.4 07/16/2015   MCV 95.2 07/16/2015   PLT 178.0 07/16/2015      Component Value Date/Time   NA 139 01/13/2016 1020   K 4.5 01/13/2016 1020   CL 104 01/13/2016 1020   CO2 29 01/13/2016 1020   GLUCOSE 163 (H) 01/13/2016 1020   BUN 21 01/13/2016 1020   CREATININE 0.96 01/13/2016 1020   CREATININE 0.98 04/03/2015 0957   CALCIUM 9.2 01/13/2016 1020   PROT 6.9 01/13/2016 1020   ALBUMIN 4.1 01/13/2016 1020   AST 23 01/13/2016 1020   ALT 36 01/13/2016 1020   ALKPHOS 27 (L) 01/13/2016 1020   BILITOT 1.2 01/13/2016 1020   GFRNONAA 90 (L) 09/25/2013 1400   GFRNONAA >89 06/09/2012 1515   GFRAA >90 09/25/2013  1400   GFRAA >89 06/09/2012 1515   Lab Results  Component Value Date   CHOL 118 01/13/2016   HDL 38.90 (L) 01/13/2016   LDLCALC 61 01/13/2016   LDLDIRECT 114 (H) 03/21/2010   TRIG 92.0 01/13/2016   CHOLHDL 3 01/13/2016   Lab Results  Component Value Date   HGBA1C 6.6 (H) 01/13/2016   Lab Results  Component Value Date   [id number] 01/13/2016       ASSESSMENT AND PLAN 65 y.o. year old male  has a past medical history of AAA (abdominal aortic aneurysm) ([place]); ALLERGIC RHINITIS; Anemia; Anxiety; Arthritis; Brachial plexopathy; COPD (chronic obstructive pulmonary disease) ([place]); Dissecting aortic aneurysm, thoracic ([place]) (02/20/2012); DM2 (diabetes mellitus, type 2) ([place]); DVT (deep venous thrombosis) ([place]); ERECTILE DYSFUNCTION, ORGANIC; GERD (gastroesophageal reflux disease); HYPERLIPIDEMIA; Hypertension; MIGRAINE HEADACHE; OBSTRUCTIVE SLEEP APNEA (02/2008 sleep study); Thyroid nodule (04/21/2015); and Unspecified vitamin D  deficiency. here with:  1. Memory impairment, following cardiac surgery - amnestic episodes, confusional episodes.  2. Brachial plexopathy 3. Peripheral NP.   The patient is encouraged to participate in relaxation techniques as suggested by Dr. Valentina Shaggy. He is encouraged to continue with the exercises that he learned for brachial plexopathy.  He does have a MOCA result that corresponds with MCI. 25/30 , "pump brain"  He will follow-up in 6 months with NP. consider Aricept.     Cai Flott, MD  02/10/2016, 3:06 PM [facility] [address], [place], [place] [address] [phone]

## 2016-02-12 ENCOUNTER — Other Ambulatory Visit: Payer: Self-pay | Admitting: Neurology

## 2016-02-12 DIAGNOSIS — G609 Hereditary and idiopathic neuropathy, unspecified: Secondary | ICD-10-CM

## 2016-02-12 DIAGNOSIS — G3184 Mild cognitive impairment, so stated: Secondary | ICD-10-CM

## 2016-03-02 ENCOUNTER — Other Ambulatory Visit: Payer: Self-pay | Admitting: *Deleted

## 2016-03-02 MED ORDER — MONTELUKAST SODIUM 10 MG PO TABS: 5.0000 mg | ORAL_TABLET | Freq: Two times a day (BID) | ORAL | 1 refills | Status: DC

## 2016-03-02 MED FILL — MONTELUKAST SOD 10 MG TAB: 10 | 90 days supply | Qty: 90 | Fill #0

## 2016-03-03 ENCOUNTER — Other Ambulatory Visit: Payer: Self-pay | Admitting: Internal Medicine

## 2016-03-04 MED FILL — ALPRAZolam 1 MG TABS: 1 | 30 days supply | Qty: 60 | Fill #0

## 2016-03-04 NOTE — Telephone Encounter (Signed)
Sent to pharmacy 

## 2016-03-11 NOTE — Progress Notes (Deleted)
This encounter was created in error - please disregard.

## 2016-03-31 ENCOUNTER — Other Ambulatory Visit: Payer: Self-pay | Admitting: Internal Medicine

## 2016-03-31 MED FILL — AMOXICILLIN 500 MG CAPSULE: 500 | 3 days supply | Qty: 12 | Fill #0

## 2016-03-31 MED FILL — POLYETHYLENE GLYCOL 3350 PO: 30 days supply | Qty: 527 | Fill #0

## 2016-03-31 MED FILL — OMEGA-3 ETHYL ESTERS 1 GM C: 1 | 90 days supply | Qty: 180 | Fill #1

## 2016-04-12 MED FILL — ALPRAZolam 1 MG TABS: 1 | 30 days supply | Qty: 60 | Fill #1

## 2016-04-13 ENCOUNTER — Ambulatory Visit
Admission: RE | Admit: 2016-04-13 | Discharge: 2016-04-13 | Disposition: A | Discharge status: Home / Self Care | Payer: 59 | Source: Ambulatory Visit | Attending: Neurology | Admitting: Neurology

## 2016-04-13 ENCOUNTER — Ambulatory Visit
Admission: RE | Admit: 2016-04-13 | Discharge: 2016-04-13 | Disposition: A | Discharge status: Home / Self Care | Payer: 59 | Source: Ambulatory Visit | Attending: Vascular Surgery | Admitting: Vascular Surgery

## 2016-04-13 ENCOUNTER — Other Ambulatory Visit: Payer: Self-pay | Admitting: Vascular Surgery

## 2016-04-13 ENCOUNTER — Telehealth: Payer: Self-pay

## 2016-04-13 ENCOUNTER — Other Ambulatory Visit: Payer: Self-pay | Admitting: *Deleted

## 2016-04-13 DIAGNOSIS — I714 Abdominal aortic aneurysm, without rupture, unspecified: Secondary | ICD-10-CM

## 2016-04-13 DIAGNOSIS — G3184 Mild cognitive impairment, so stated: Secondary | ICD-10-CM

## 2016-04-13 DIAGNOSIS — G609 Hereditary and idiopathic neuropathy, unspecified: Secondary | ICD-10-CM

## 2016-04-13 DIAGNOSIS — Z01818 Encounter for other preprocedural examination: Secondary | ICD-10-CM | POA: Diagnosis not present

## 2016-04-13 DIAGNOSIS — I716 Thoracoabdominal aortic aneurysm, without rupture, unspecified: Secondary | ICD-10-CM

## 2016-04-13 DIAGNOSIS — I7103 Dissection of thoracoabdominal aorta: Secondary | ICD-10-CM

## 2016-04-13 DIAGNOSIS — R410 Disorientation, unspecified: Secondary | ICD-10-CM | POA: Diagnosis not present

## 2016-04-13 DIAGNOSIS — I712 Thoracic aortic aneurysm, without rupture: Secondary | ICD-10-CM | POA: Diagnosis not present

## 2016-04-13 MED ORDER — IOPAMIDOL (ISOVUE-370) INJECTION 76%
125.0000 mL | Freq: Once | INTRAVENOUS | Status: AC | PRN
  Administered 2016-04-13: 125 mL via INTRAVENOUS

## 2016-04-13 MED ORDER — GADOBENATE DIMEGLUMINE 529 MG/ML IV SOLN
20.0000 mL | Freq: Once | INTRAVENOUS | Status: AC | PRN
  Administered 2016-04-13: 20 mL via INTRAVENOUS

## 2016-04-13 NOTE — Telephone Encounter (Signed)
I called pt to discuss MRI results. No answer, left a message asking pt to call me back.

## 2016-04-13 NOTE — Telephone Encounter (Signed)
-----   Message from Larey Seat, MD sent at 04/13/2016  3:28 PM EDT ----- Dear [name]. The Brain MRI was normal for age, noted were some microvascular changes and generalized, mild  brain atrophy.  This is not a sign of dementia. CD

## 2016-04-14 NOTE — Telephone Encounter (Signed)
Pt returned call. Nurse took call

## 2016-04-14 NOTE — Telephone Encounter (Signed)
Dr. Brett Fairy called pt and sent pt a mychart message regarding his MRI. If pt calls back with further questions, please send the message to her.

## 2016-04-14 NOTE — Telephone Encounter (Signed)
The changes seen in this MRI are not advanced for age.  [name] has 2 , tiny possible hemosiderin deposits, these are seen in the context with small or micro vessel disease and do not constitute sequela of a BRAIN BLEED.

## 2016-04-14 NOTE — Telephone Encounter (Signed)
I spoke to [name] regarding his MRI. I advised him that Dr. Brett Fairy reviewed his MRI brain results and wanted [name] to know that his MRI was norma for his age, however, microvascular changes and generalized mild brain atrophy were noted. This is not a sign of dementia. [name] says that he can see the MRI results, and the impression mentions few punctate foci of hemosiderin deposition. [name] wants a call from Dr. Brett Fairy to discuss. I offered him a follow up to discuss but he declined, saying he is about to go on vacation and wants this taken care of before then so he is not worrying about it on vacation.  [name] can be reached at [phone]. If you have to leave a message, he may not get it at that number, and is asking then that you send a mychart message to the [name] to give him a good time to call back.

## 2016-04-20 ENCOUNTER — Ambulatory Visit: Payer: 59 | Admitting: Vascular Surgery

## 2016-04-21 ENCOUNTER — Encounter: Payer: 59 | Admitting: Cardiothoracic Surgery

## 2016-04-27 ENCOUNTER — Ambulatory Visit: Payer: 59 | Admitting: Vascular Surgery

## 2016-04-28 ENCOUNTER — Encounter: Payer: 59 | Admitting: Cardiothoracic Surgery

## 2016-05-05 ENCOUNTER — Encounter: Payer: Self-pay | Admitting: Vascular Surgery

## 2016-05-05 ENCOUNTER — Encounter: Payer: Self-pay | Admitting: Cardiothoracic Surgery

## 2016-05-05 ENCOUNTER — Ambulatory Visit (INDEPENDENT_AMBULATORY_CARE_PROVIDER_SITE_OTHER): Payer: 59 | Admitting: Cardiothoracic Surgery

## 2016-05-05 VITALS — BP 135/69 | HR 68 | Resp 16 | Ht 76.0 in | Wt 238.0 lb

## 2016-05-05 DIAGNOSIS — I7101 Dissection of thoracic aorta: Secondary | ICD-10-CM

## 2016-05-05 DIAGNOSIS — I71019 Dissection of thoracic aorta, unspecified: Secondary | ICD-10-CM

## 2016-05-05 DIAGNOSIS — Z9889 Other specified postprocedural states: Secondary | ICD-10-CM

## 2016-05-05 NOTE — Progress Notes (Signed)
PCP is Hoyt Koch, MD Referring Provider is Josue Hector, MD  Chief Complaint  Patient presents with  . Routine Post Op    1 yr f/u with CTA C/A/P 04/13/16 per [facility]    HPI:One year follow up with CTA of the thoracic aorta Patient had emergency repair of [place] type A ascending aortic dissection in 2013. The ascending aorta was replaced with a Hemashield straight graft with resuspension of the aortic valve using axillary artery cannulation and circulatory arrest.  The patient subsequently had resection and grafting of a AAA by Dr. Kellie Simmering in 2014 and did well.  Patient's cardiac status is followed by Dr. Johnsie Cancel. Myocardial perfusion scan was low risk in 2015. Last echocardiogram showed no significant AI, good LV function.  CTA of thoracic and abdominal aorta shows stable repair of the ascending aorta without aneurysmal expansion of the arch or descending thoracic aorta. There is a short segment of patent false lumen in the distal descending thoracic aorta which is stable. The abdominal aortic graft is intact and the abdominal viscera and kidneys are well perfused.  There are no at risk pulmonary a nodules or mediastinal adenopathy Past Medical History:  Diagnosis Date  . AAA (abdominal aortic aneurysm) ([place])   . ALLERGIC RHINITIS   . Anemia    post op, treated /w Fe  . Anxiety   . Arthritis    OA, hands, - thumbs- specifically  . Brachial plexopathy    double crush injury with median nerve damage on the right dominant hand.   [name] COPD (chronic obstructive pulmonary disease) ([place])    also states he has been told that he has slight emphysema   . Dissecting aortic aneurysm, thoracic ([place]) 02/20/2012   s/p emergent surgical repair  . DM2 (diabetes mellitus, type 2) ([place])   . DVT (deep venous thrombosis) ([facility])    coumadin- x3 months , post op  . ERECTILE DYSFUNCTION, ORGANIC   . GERD (gastroesophageal reflux disease)    related to diet & anxiety   . HYPERLIPIDEMIA   .  Hypertension    pt. followwed by Dr. Johnsie Cancel  . MIGRAINE HEADACHE    induced from physical , emotional stress , but sometimes occur randomly, also experiences BPPV  . OBSTRUCTIVE SLEEP APNEA 02/2008 sleep study   moderate & central- not able to tolerate CPAP   . Thyroid nodule 04/21/2015   2.3 cm on right, incidental finding on 03/2015 CT chest - f/u US ordered  . Unspecified vitamin D deficiency     Past Surgical History:  Procedure Laterality Date  . ABDOMINAL AORTIC ANEURYSM REPAIR N/A 11/01/2012   Procedure: ANEURYSM ABDOMINAL AORTIC REPAIR;  Surgeon: Mal Misty, MD;  Location[facility];  Service: Vascular;  Laterality: N/A;  Resection and Grafting of Abdominal Aortic Aneurysm,(AORTA BI ILIAC)  . Nasal fx without repair    . THORACIC AORTIC ANEURYSM REPAIR  02/20/2012   Procedure: THORACIC ASCENDING ANEURYSM REPAIR (AAA);  Surgeon: Ivin Poot, MD;  Location: [place];  Service: Open Heart Surgery;  Laterality: N/A;    Family History  Problem Relation Age of Onset  . Diabetes Mother   . Stroke Mother   . Dementia Mother   . Deep vein thrombosis Father     PE  . Varicose Veins Father   . Heart disease Maternal Grandmother   . Heart disease Maternal Uncle   . Heart disease Paternal Uncle   . Heart disease Cousin   . Lung cancer Cousin  2 cousins    Social History Social History  Substance Use Topics  . Smoking status: Former Smoker    Packs/day: 0.10    Types: Cigarettes    Quit date: 02/18/2012  . Smokeless tobacco: Never Used     Comment: 10 cigs/day, quit 02/2012 after TAA emergent repair  . Alcohol use 0.0 oz/week     Comment: shot of Tequila every day     Current Outpatient Prescriptions  Medication Sig Dispense Refill  . ALPRAZolam (XANAX) 1 MG tablet TAKE 1/2 TABLET BY MOUTH FOUR TIMES DAILY 60 tablet 3  . aspirin EC 81 MG tablet Take 81 mg by mouth daily with lunch.    [name] azelastine (ASTELIN) 0.1 % nasal spray PLACE 2 SPRAYS INTO BOTH NOSTRILS 2 (TWO) TIMES  DAILY. 30 mL 2  . calcium carbonate (TUMS EX) 750 MG chewable tablet Chew 1 tablet by mouth daily as needed for heartburn.     . cholecalciferol (VITAMIN D) 1000 UNITS tablet Take 2,000 Units by mouth daily.    [name] EPIPEN 2-PAK 0.3 MG/0.3ML SOAJ injection Inject 0.3 mLs (0.3 mg total) into the muscle once. 2 Device 3  . fexofenadine (ALLEGRA) 180 MG tablet Take 90 mg by mouth 2 (two) times daily.     . fluticasone (FLONASE) 50 MCG/ACT nasal spray PLACE 2 SPRAYS INTO BOTH NOSTRILS DAILY. 16 g 5  . metFORMIN (GLUCOPHAGE) 500 MG tablet Take by mouth daily with supper.    . metoprolol tartrate (LOPRESSOR) 25 MG tablet Take 1 tablet (25 mg total) by mouth 3 (three) times daily. 270 tablet 3  . montelukast (SINGULAIR) 10 MG tablet Take 0.5 tablets (5 mg total) by mouth 2 (two) times daily. 90 tablet 1  . Multiple Vitamin (MULTIVITAMIN WITH MINERALS) TABS Take 1 tablet by mouth daily.    [name] omega-3 acid ethyl esters (LOVAZA) 1 g capsule TAKE 1 CAPSULE BY MOUTH 2 TIMES DAILY. 180 capsule 3  . OVER THE COUNTER MEDICATION Apply 1 application topically daily as needed (for acne). Oxy 10 vanishing cream    . polyethylene glycol powder (GLYCOLAX/MIRALAX) powder MIX 17G WITH LIQUID AND DRINK BY MOUTH DAILY. 527 g 5  . predniSONE (DELTASONE) 10 MG tablet TAKE 2 TABLETS BY MOUTH DAILY AS NEEDED FOR ASTHMA FLARE-UP 10 tablet 0  . rosuvastatin (CRESTOR) 10 MG tablet Take 1 tablet (10 mg total) by mouth daily. 30 tablet 0  . thiamine (VITAMIN B-1) 100 MG tablet Take 100 mg by mouth 2 (two) times daily at 10 AM and 5 PM.     . vitamin B-12 (CYANOCOBALAMIN) 500 MCG tablet Take 500-1,000 mcg by mouth daily.     [name] azithromycin (ZITHROMAX) 500 MG tablet Take 1 tablet (500 mg total) by mouth daily. If you have 3+loose stools/24hr. Can stop taking if diarrhea resolves (Patient not taking: Reported on 05/05/2016) 5 tablet 0   No current facility-administered medications for this visit.     Allergies  Allergen Reactions  .  Testosterone Shortness Of Breath    Medication:Androgel Pt states trouble breathing at night  . Codeine Nausea And Vomiting  . Gabapentin     "drunk" feeling  . Ultram [Tramadol] Nausea And Vomiting  . Albuterol Other (See Comments)    Panic attack    Review of Systems         Review of Systems :  [ y ] = yes, [  ] = no        General :  Weight gain [  ]  Weight loss  [   ]  Fatigue [  ]  Fever [  ]  Chills  [  ]                                Weakness  [  ]           HEENT    Headache [  ]  Dizziness [  ]  Blurred vision [  ] Glaucoma  [  ]                          Nosebleeds [  ] Painful or loose teeth [  ]        Cardiac :  Chest pain/ pressure [  ]  Resting SOB [  ] exertional SOB [ mild ]                        Orthopnea [  ]  Pedal edema  [  ]  Palpitations [  ] Syncope/presyncope [ ]                         Paroxysmal nocturnal dyspnea [  ]         Pulmonary : cough [ occasional ]  wheezing [  ]  Hemoptysis [  ] Sputum [  ] Snoring [  ]                              Pneumothorax [  ]  Sleep apnea [  ]        GI : Vomiting [  ]  Dysphagia [  ]  Melena  [  ]  Abdominal pain [  ] BRBPR [  ]gallstones noted on CT scan              Heart burn [  ]  Constipation [  ] Diarrhea  [  ] Colonoscopy [   ]        GU : Hematuria [  ]  Dysuria [  ]  Nocturia [  ] UTI's [  ] Left kidney stone 3 mm noted on CT scan       Vascular : Claudication [  ]  Rest pain [  ]  DVT [  ] Vein stripping [  ] leg ulcers [  ]                          TIA [  ] Stroke [  ]  Varicose veins [  ]        NEURO :  Headaches  [place]  ] Seizures [  ] Vision changes [  ] Paresthesias [  ]                                       Seizures [  ]        Musculoskeletal :  Arthritis [  ] Gout  [  ]  Back pain [  ]  Joint pain [  ]        Skin :  Rash [  ]  Melanoma [  ] Sores [  ]  Heme : Bleeding problems [  ]Clotting Disorders [  ] Anemia [  ]Blood Transfusion [ ]         Endocrine : Diabetes [  ] Heat or Cold  intolerance [  ] Polyuria [  ]excessive thirst [ ] right thyroid nodule op seed previously this year followed by endocrinologist        Psych : Depression [  ]  Anxiety [ yes ]  Psych hospitalizations [  ] Memory change [  ]                                                BP 135/69 (BP Location: Right Arm, Patient Position: Sitting, Cuff Size: Large)   Pulse 68   Resp 16   Ht 6\' 4"  (1.93 m)   Wt 238 lb (108 kg)   SpO2 96% Comment: ON RA  BMI 28.97 kg/m  Physical Exam       Physical Exam  General: Anxious middle-aged Caucasian male no acute distress HEENT: Normocephalic pupils equal , dentition adequate Neck: Supple without JVD, adenopathy, or bruit Chest: Clear to auscultation, symmetrical breath sounds, no rhonchi, no tenderness             or deformity Cardiovascular: Regular rate and rhythm, no murmur, no gallop, peripheral pulses             palpable in all extremities Abdomen:  Soft, nontender, no palpable mass or organomegaly Extremities: Warm, well-perfused, no clubbing cyanosis edema or tenderness,              no venous stasis changes of the legs Rectal/GU: Deferred Neuro: Grossly non--focal and symmetrical throughout Skin: Clean and dry without rash or ulceration  Diagnostic Tests: CTA of thoracic and abdominal aorta showing intact repairs and no dilatation of the small segments of false lumen in the distal thoracic aorta and proximal abdominal aorta Impression: Patient doing well after repair of type A ascending aortic dissection with straight graft and resuspension of aortic valve No evidence of anastomotic pseudoaneurysm or expansion of the false l lumen--most of the false lumen has thrombosed in the chest.  Plan: Scanning surveillance CT scan in one year. Blood pressure and lipid control., Continued tobacco cessation Daily exercise 30 minute walk Aspirin daily   Len Childs, MD [facility] [phone]

## 2016-05-08 ENCOUNTER — Encounter: Payer: Self-pay | Admitting: Internal Medicine

## 2016-05-08 DIAGNOSIS — E041 Nontoxic single thyroid nodule: Secondary | ICD-10-CM

## 2016-05-10 ENCOUNTER — Other Ambulatory Visit: Payer: Self-pay | Admitting: Internal Medicine

## 2016-05-10 MED FILL — METOPROLOL TARTRATE 25 MG T: 25 | 90 days supply | Qty: 270 | Fill #3

## 2016-05-10 MED FILL — ROSUVASTATIN CALCIUM 10 MG: 10 | 30 days supply | Qty: 30 | Fill #0

## 2016-05-10 MED FILL — METFORMIN HCL ER 500 MG TAB: 500 | 90 days supply | Qty: 90 | Fill #1

## 2016-05-11 MED FILL — predniSONE 10 MG TABS: 10 | 5 days supply | Qty: 10 | Fill #0

## 2016-05-12 ENCOUNTER — Encounter: Payer: Self-pay | Admitting: Vascular Surgery

## 2016-05-12 ENCOUNTER — Ambulatory Visit (INDEPENDENT_AMBULATORY_CARE_PROVIDER_SITE_OTHER): Payer: 59 | Admitting: Vascular Surgery

## 2016-05-12 VITALS — BP 128/61 | HR 65 | Temp 98.2°F | Resp 18 | Ht 76.0 in | Wt 233.0 lb

## 2016-05-12 DIAGNOSIS — I714 Abdominal aortic aneurysm, without rupture, unspecified: Secondary | ICD-10-CM

## 2016-05-12 NOTE — Progress Notes (Signed)
HISTORY AND PHYSICAL     CC:  F/u after CT Referring Provider:  Rowe Clack, MD  HPI: This is a 65 y.o. male who presents today for f/u for repair of a type A aortic dissection with resuspension of aortic valve performed by Dr. Tharon Aquas Trigt and a few months later had resection and grafting of abdominal aortic aneurysm performed by Dr. Kellie Simmering in 2014.  Pt continues to do well since his repair.  In 04/15/15, he was noted to have some retrograde flow into the false lumen.  This has continued to be stable.    He saw Dr. Lawson Fiscal on 05/05/16 & got a good report.  He states he wants to see him back in a year with a CTA.    The pt is on oral agents for DM.  He is on a statin for cholesterol management.  He takes a BB for blood pressure control.  He takes a daily aspirin.    He did have an incidental finding of a thyroid nodule on CT scan and this is being worked up and evaluated.  Past Medical History:  Diagnosis Date  . AAA (abdominal aortic aneurysm) ([place])   . ALLERGIC RHINITIS   . Anemia    post op, treated /w Fe  . Anxiety   . Arthritis    OA, hands, - thumbs- specifically  . Brachial plexopathy    double crush injury with median nerve damage on the right dominant hand.   [name] COPD (chronic obstructive pulmonary disease) ([place])    also states he has been told that he has slight emphysema   . Dissecting aortic aneurysm, thoracic ([place]) 02/20/2012   s/p emergent surgical repair  . DM2 (diabetes mellitus, type 2) ([place])   . DVT (deep venous thrombosis) ([facility])    coumadin- x3 months , post op  . ERECTILE DYSFUNCTION, ORGANIC   . GERD (gastroesophageal reflux disease)    related to diet & anxiety   . HYPERLIPIDEMIA   . Hypertension    pt. followwed by Dr. Johnsie Cancel  . MIGRAINE HEADACHE    induced from physical , emotional stress , but sometimes occur randomly, also experiences BPPV  . OBSTRUCTIVE SLEEP APNEA 02/2008 sleep study   moderate & central- not able to tolerate CPAP   .  Thyroid nodule 04/21/2015   2.3 cm on right, incidental finding on 03/2015 CT chest - f/u US ordered  . Unspecified vitamin D deficiency     Past Surgical History:  Procedure Laterality Date  . ABDOMINAL AORTIC ANEURYSM REPAIR N/A 11/01/2012   Procedure: ANEURYSM ABDOMINAL AORTIC REPAIR;  Surgeon: Mal Misty, MD;  Location: [facility] OR;  Service: Vascular;  Laterality: N/A;  Resection and Grafting of Abdominal Aortic Aneurysm,(AORTA BI ILIAC)  . Nasal fx without repair    . THORACIC AORTIC ANEURYSM REPAIR  02/20/2012   Procedure: THORACIC ASCENDING ANEURYSM REPAIR (AAA);  Surgeon: Ivin Poot, MD;  Location: [place];  Service: Open Heart Surgery;  Laterality: N/A;    Allergies  Allergen Reactions  . Testosterone Shortness Of Breath    Medication:Androgel Pt states trouble breathing at night  . Codeine Nausea And Vomiting  . Gabapentin     "drunk" feeling  . Ultram [Tramadol] Nausea And Vomiting  . Albuterol Other (See Comments)    Panic attack    Current Outpatient Prescriptions  Medication Sig Dispense Refill  . ALPRAZolam (XANAX) 1 MG tablet TAKE 1/2 TABLET BY MOUTH FOUR TIMES DAILY 60 tablet 3  .  aspirin EC 81 MG tablet Take 81 mg by mouth daily with lunch.    [name] azelastine (ASTELIN) 0.1 % nasal spray PLACE 2 SPRAYS INTO BOTH NOSTRILS 2 (TWO) TIMES DAILY. 30 mL 2  . calcium carbonate (TUMS EX) 750 MG chewable tablet Chew 1 tablet by mouth daily as needed for heartburn.     . cholecalciferol (VITAMIN D) 1000 UNITS tablet Take 2,000 Units by mouth daily.    [name] EPIPEN 2-PAK 0.3 MG/0.3ML SOAJ injection Inject 0.3 mLs (0.3 mg total) into the muscle once. 2 Device 3  . fexofenadine (ALLEGRA) 180 MG tablet Take 90 mg by mouth 2 (two) times daily.     . fluticasone (FLONASE) 50 MCG/ACT nasal spray PLACE 2 SPRAYS INTO BOTH NOSTRILS DAILY. 16 g 5  . metFORMIN (GLUCOPHAGE) 500 MG tablet Take by mouth daily with supper.    . metoprolol tartrate (LOPRESSOR) 25 MG tablet Take 1 tablet (25 mg  total) by mouth 3 (three) times daily. 270 tablet 3  . montelukast (SINGULAIR) 10 MG tablet Take 0.5 tablets (5 mg total) by mouth 2 (two) times daily. 90 tablet 1  . Multiple Vitamin (MULTIVITAMIN WITH MINERALS) TABS Take 1 tablet by mouth daily.    [name] omega-3 acid ethyl esters (LOVAZA) 1 g capsule TAKE 1 CAPSULE BY MOUTH 2 TIMES DAILY. 180 capsule 3  . OVER THE COUNTER MEDICATION Apply 1 application topically daily as needed (for acne). Oxy 10 vanishing cream    . polyethylene glycol powder (GLYCOLAX/MIRALAX) powder MIX 17G WITH LIQUID AND DRINK BY MOUTH DAILY. 527 g 5  . predniSONE (DELTASONE) 10 MG tablet TAKE 2 TABLETS BY MOUTH DAILY AS NEEDED FOR ASTHMA FLARE-UP 10 tablet 0  . rosuvastatin (CRESTOR) 10 MG tablet TAKE 1 TABLET BY MOUTH ONCE DAILY 30 tablet 5  . thiamine (VITAMIN B-1) 100 MG tablet Take 100 mg by mouth 2 (two) times daily at 10 AM and 5 PM.     . vitamin B-12 (CYANOCOBALAMIN) 500 MCG tablet Take 500-1,000 mcg by mouth daily.     [name] azithromycin (ZITHROMAX) 500 MG tablet Take 1 tablet (500 mg total) by mouth daily. If you have 3+loose stools/24hr. Can stop taking if diarrhea resolves (Patient not taking: Reported on 05/12/2016) 5 tablet 0   No current facility-administered medications for this visit.     Family History  Problem Relation Age of Onset  . Diabetes Mother   . Stroke Mother   . Dementia Mother   . Deep vein thrombosis Father     PE  . Varicose Veins Father   . Heart disease Maternal Grandmother   . Heart disease Maternal Uncle   . Heart disease Paternal Uncle   . Heart disease Cousin   . Lung cancer Cousin     2 cousins    Social History   Social History  . Marital status: Divorced    Spouse name: N/A  . Number of children: 2  . Years of education: acad. deg.   Occupational History  . 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 Smoking status: Former Smoker    Packs/day: 0.10    Types: Cigarettes    Quit date: 02/18/2012    . Smokeless tobacco: Never Used     Comment: 10 cigs/day, quit 02/2012 after TAA emergent repair  . Alcohol use 0.0 oz/week     Comment: shot of Tequila every day   . Drug use: No  . Sexual activity: Not on  file   Other Topics Concern  . Not on file   Social History Narrative   Patient is single and lives at home alone.    Arboriculturist.   Patient works at [employer] full time.   Right handed.   Caffeine one cup of coffee daily.      REVIEW OF SYSTEMS:   [X]  denotes positive finding, [ ]  denotes negative finding Cardiac  Comments:  Chest pain or chest pressure:    Shortness of breath upon exertion:    Short of breath when lying flat:    Irregular heart rhythm:        Vascular    Pain in calf, thigh, or hip brought on by ambulation:    Pain in feet at night that wakes you up from your sleep:  x occasionally  Blood clot in your veins:    Leg swelling:         Pulmonary    Oxygen at home:    Productive cough:     Wheezing:         Neurologic    Sudden weakness in arms or legs:     Sudden numbness in arms or legs:     Sudden onset of difficulty speaking or slurred speech:    Temporary loss of vision in one eye:     Problems with dizziness:         Gastrointestinal    Blood in stool:     Vomited blood:         Genitourinary    Burning when urinating:     Blood in urine:        Psychiatric    Major depression:         Hematologic    Bleeding problems:    Problems with blood clotting too easily:        Skin    Rashes or ulcers:        Constitutional    Fever or chills:      PHYSICAL EXAMINATION:  Vitals:   05/12/16 1558  BP: 128/61  Pulse: 65  Resp: 18  Temp: 98.2 F (36.8 C)   Body mass index is 28.36 kg/m.  General:  WDWN in NAD; vital signs documented above Gait: Not observed HENT: WNL, normocephalic Pulmonary: normal non-labored breathing , without Rales, rhonchi,  wheezing Cardiac: regular HR, without  Murmurs, rubs or gallops;  without carotid bruits Abdomen: soft, NT, no masses Skin: without rashes Vascular Exam/Pulses:  Right Left  Radial 2+ (normal) 2+ (normal)  Popliteal 2+ (normal) 2+ (normal)  DP 2+ (normal) 2+ (normal)   Extremities: without ischemic changes, without Gangrene , without cellulitis; without open wounds;  Musculoskeletal: no muscle wasting or atrophy; there are spider veins present around the left ankle. Neurologic: A&O X 3;  No focal weakness or paresthesias are detected Psychiatric:  The pt has Normal affect.   Non-Invasive Vascular Imaging:   CTA 04/13/16: IMPRESSION: Stable appearance of the surgically replaced ascending thoracic aorta and infrarenal abdominal aorta.  Stable aneurysm and dissection involving the thoracoabdominal aorta. This is a type B dissection that starts in the mid descending thoracic aorta and terminates in the infrarenal abdominal aorta. Maximum size of the thoracoabdominal aorta measures 4.3 cm.  Emphysema.  Cholelithiasis without inflammatory changes.  Hepatic steatosis.  Nonobstructive left kidney stone.  Pt meds includes: Statin:  Yes.   Beta Blocker:  Yes.   Aspirin:  Yes.   ACEI:  No. ARB:  No.  CCB use:  No Other Antiplatelet/Anticoagulant:  No   ASSESSMENT/PLAN:: 65 y.o. male repair of a type A aortic dissection with resuspension of aortic valve performed by Dr. Tharon Aquas Trigt and a few months later had resection and grafting of abdominal aortic aneurysm performed by Dr. Kellie Simmering in 2014 her for f/u CT scan   -pt continues to do well since surgery -he does have some retrograde flow into the false lumen that remains unchanged from his previous CT scan.  -will f/u on CT scan that will be scheduled for one year.  At that time, if his CT is unchanged, will go to every 2 years for CT scan follow up.  -continue good blood pressure control -continue statin/aspirin   Leontine Locket, PA-C [facility] [phone]  Clinic MD:  Pt seen and examined in conjunction with Dr. Donnetta Hutching  I have examined the patient, reviewed and agree with above. Reviewed CT images with actual films with the patient. Change from one year ago. Does continue to have some flow in the false lumen with no dilatation. We'll see him again in one year with repeat CT scan. If that shows no change would drop back to every two-year evaluation Curt Jews, MD 05/12/2016 4:35 PM

## 2016-05-13 NOTE — Addendum Note (Signed)
Addended by: Lianne Cure A on: 05/13/2016 08:22 AM   Modules accepted: Orders

## 2016-05-16 IMAGING — US US THYROID BIOPSY
1 series · 12 of 12 positions shown · non-contrast
Comparison: none

CLINICAL DATA: Solitary right thyroid nodule

EXAM:
ULTRASOUND-GUIDED THYROID ASPIRATION BIOPSY
TECHNIQUE: The procedure, risks (including but not limited to bleeding,
infection, organ damage ), benefits, and alternatives were explained
to the patient. Questions regarding the procedure were encouraged
and answered. The patient understands and consents to the procedure.

[Series 1: us thyroid biopsy · 0.08mm/px · 12 acquisitions, 12 frames shown]
[im 1/12]
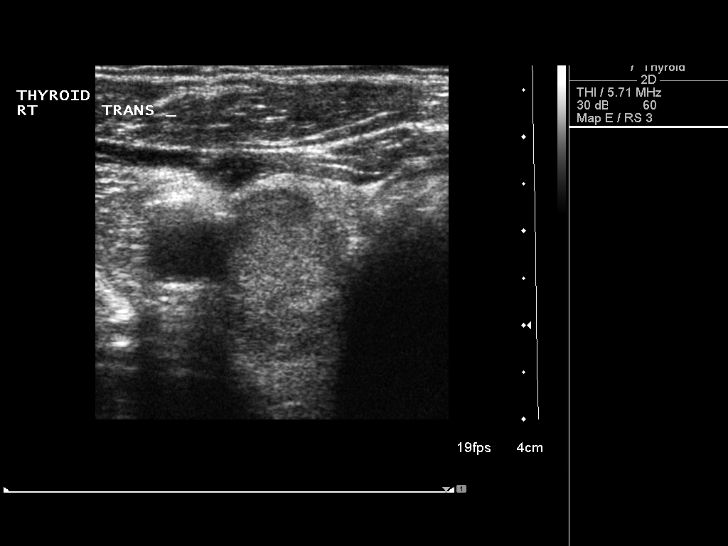
[im 2/12]
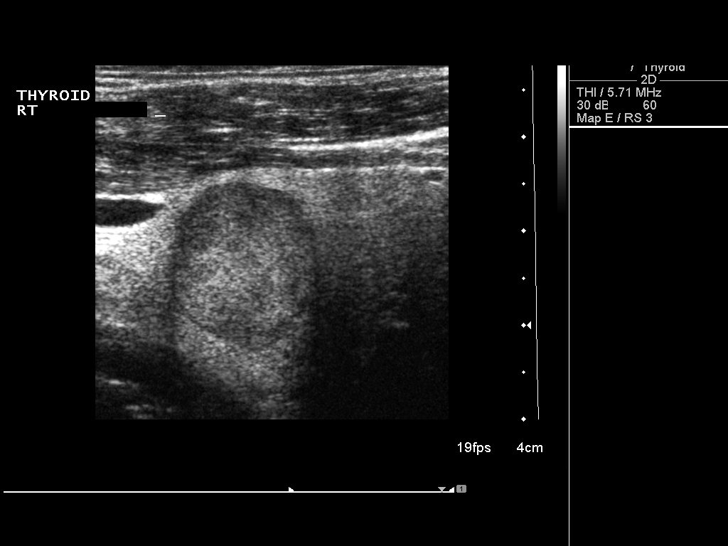
[im 3/12]
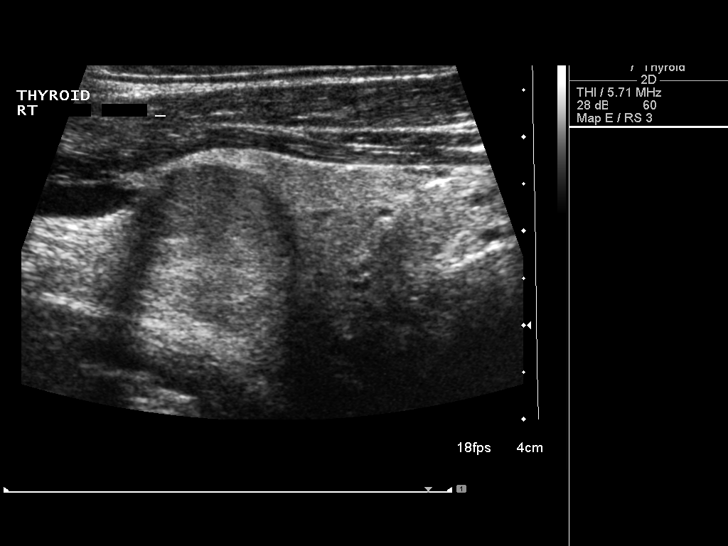
[im 4/12]
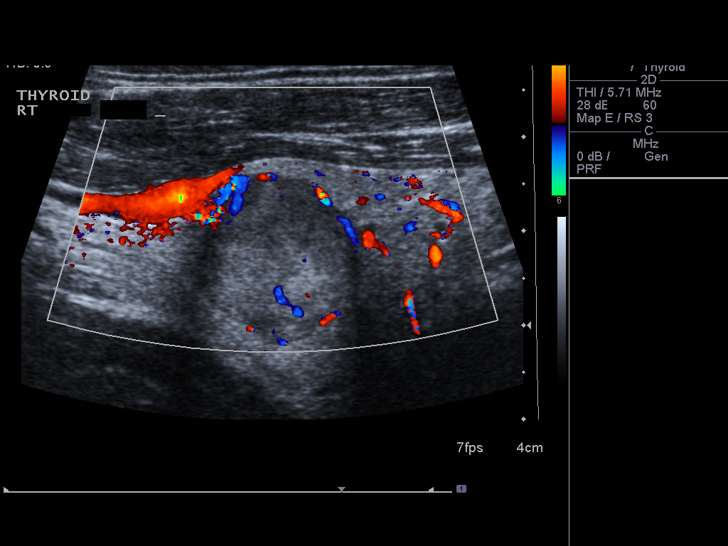
[im 5/12]
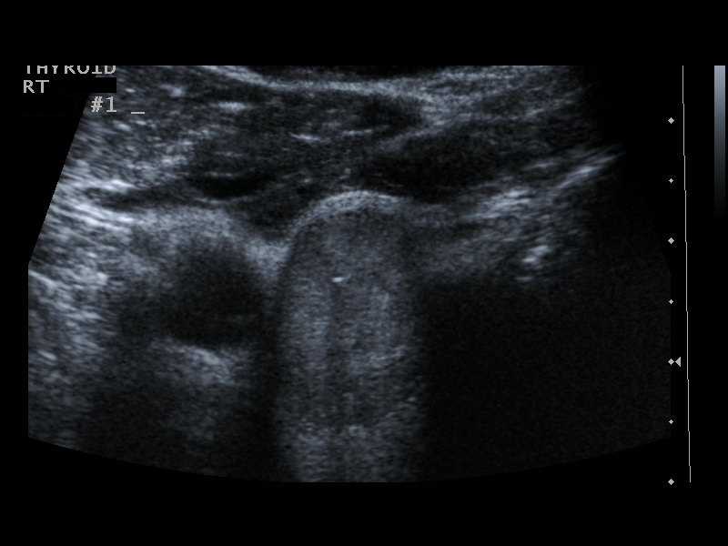
[im 6/12]
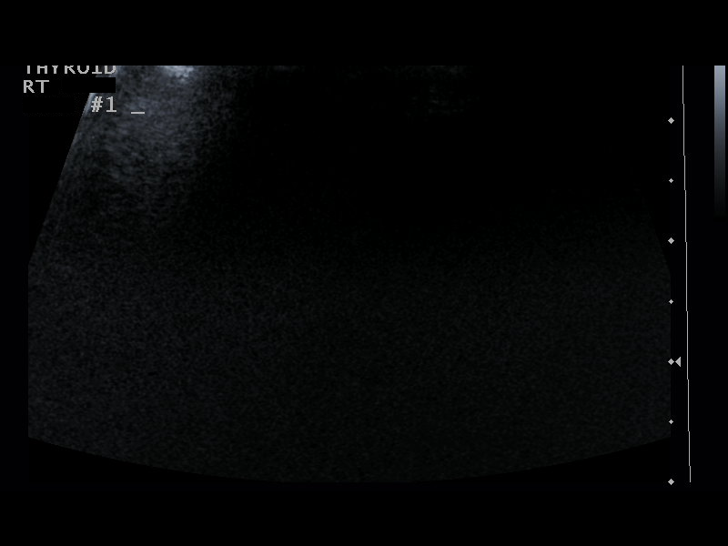
[im 7/12]
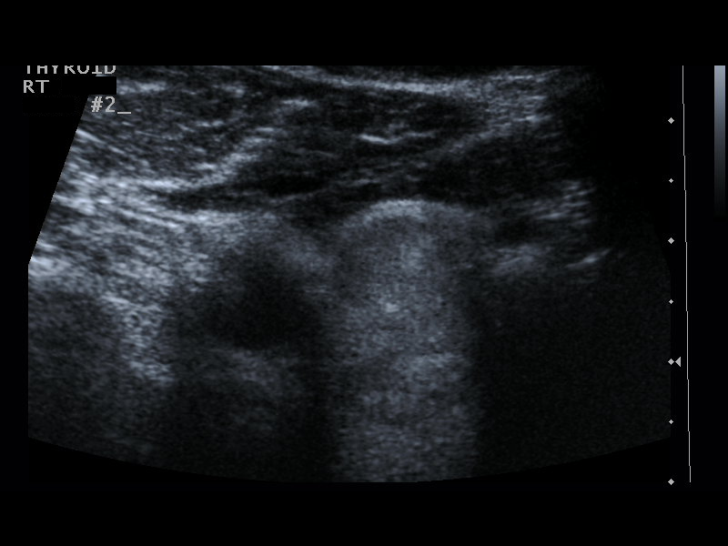
[im 8/12]
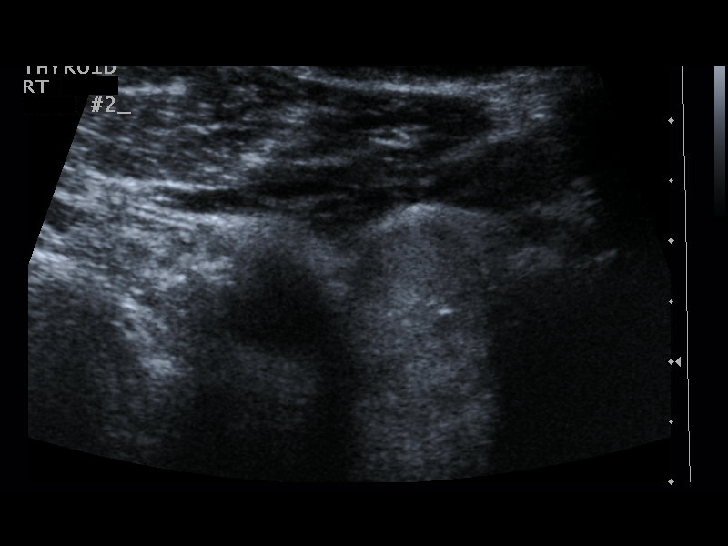
[im 9/12]
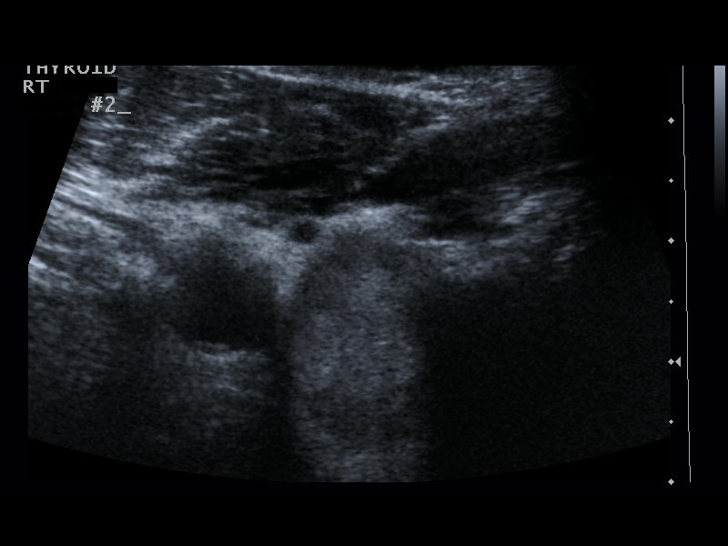
[im 10/12]
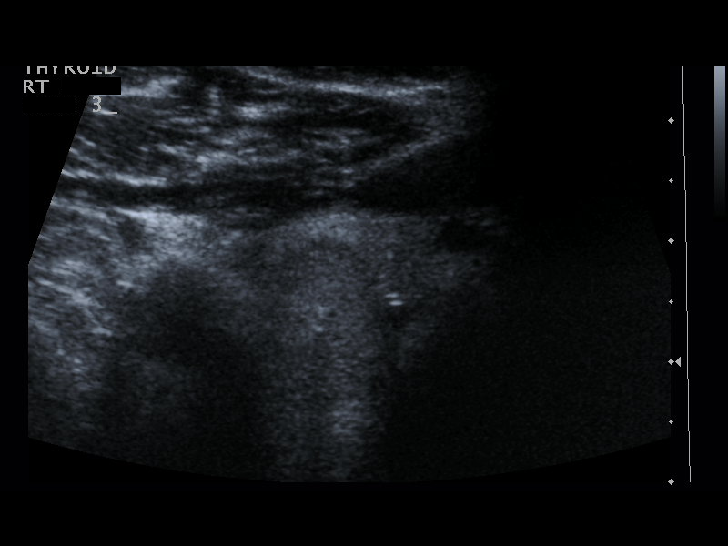
[im 11/12]
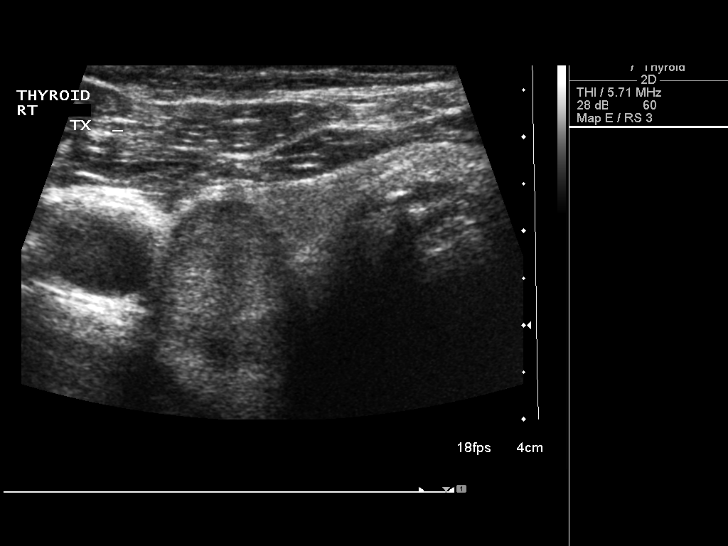
[im 12/12]
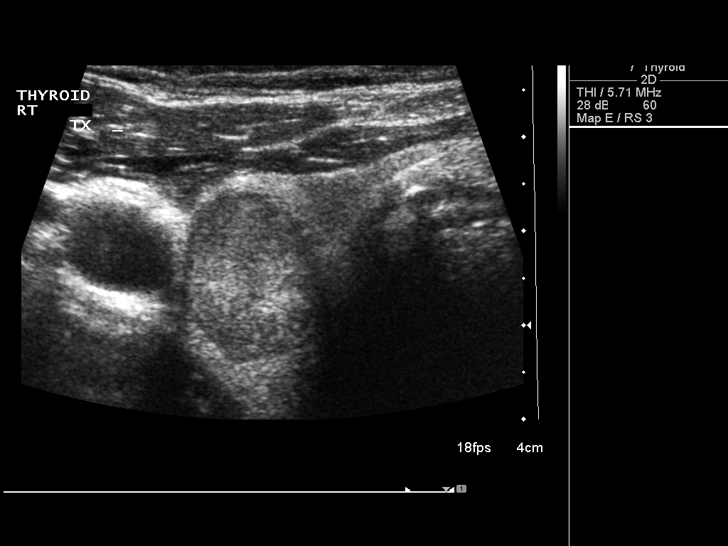

[12 of 12 positions shown; findings below may reference images not displayed]

Survey ultrasound was performed and the dominant lesion in the right
lobe was localized. An appropriate skin entry site was determined.
Skin was marked, then prepped with Betadine, draped in usual sterile
fashion, and infiltrated locally with 1% lidocaine. Under real-time
ultrasound guidance, 4 passes were made into the lesion with 25
gauge needles. The patient tolerated procedure well.

COMPLICATIONS:
COMPLICATIONS
none
IMPRESSION: 1. Technically successful ultrasound-guided thyroid aspiration
biopsy , solitary right nodule.

## 2016-05-21 ENCOUNTER — Ambulatory Visit: Payer: 59 | Admitting: Internal Medicine

## 2016-05-26 MED FILL — ALPRAZolam 1 MG TABS: 1 | 30 days supply | Qty: 60 | Fill #2

## 2016-05-26 MED FILL — MONTELUKAST SOD 10 MG TAB: 10 | 90 days supply | Qty: 90 | Fill #1

## 2016-05-31 DIAGNOSIS — E041 Nontoxic single thyroid nodule: Secondary | ICD-10-CM | POA: Diagnosis not present

## 2016-06-01 ENCOUNTER — Other Ambulatory Visit: Payer: Self-pay | Admitting: General Practice

## 2016-06-01 MED ORDER — FLUTICASONE PROPIONATE 50 MCG/ACT NA SUSP: NASAL | 3 refills | Status: DC

## 2016-06-01 MED FILL — FLUTICASONE PROP 50 MCG SPR: 50 | 30 days supply | Qty: 16 | Fill #0

## 2016-06-02 ENCOUNTER — Other Ambulatory Visit: Payer: Self-pay | Admitting: Internal Medicine

## 2016-06-02 DIAGNOSIS — E041 Nontoxic single thyroid nodule: Secondary | ICD-10-CM

## 2016-06-08 ENCOUNTER — Ambulatory Visit
Admission: RE | Admit: 2016-06-08 | Discharge: 2016-06-08 | Disposition: A | Discharge status: Home / Self Care | Payer: 59 | Source: Ambulatory Visit | Attending: Internal Medicine | Admitting: Internal Medicine

## 2016-06-08 DIAGNOSIS — E041 Nontoxic single thyroid nodule: Secondary | ICD-10-CM

## 2016-06-08 DIAGNOSIS — E042 Nontoxic multinodular goiter: Secondary | ICD-10-CM | POA: Diagnosis not present

## 2016-06-15 MED FILL — POLYETHYLENE GLYCOL 3350 PO: 30 days supply | Qty: 527 | Fill #1

## 2016-06-28 MED FILL — AZELASTINE HCL 137 MCG SPRY: 0.1 | 50 days supply | Qty: 30 | Fill #2

## 2016-06-28 MED FILL — FLUTICASONE PROP 50 MCG SPR: 50 | 30 days supply | Qty: 16 | Fill #1

## 2016-06-28 MED FILL — ALPRAZolam 1 MG TABS: 1 | 30 days supply | Qty: 60 | Fill #3

## 2016-06-28 MED FILL — OMEGA-3 ETHYL ESTERS 1 GM C: 1 | 90 days supply | Qty: 180 | Fill #2

## 2016-07-13 MED FILL — ROSUVASTATIN CALCIUM 10 MG: 10 | 30 days supply | Qty: 30 | Fill #1

## 2016-08-10 ENCOUNTER — Other Ambulatory Visit: Payer: Self-pay | Admitting: Internal Medicine

## 2016-08-10 MED FILL — ALPRAZolam 1 MG TABS: 1 | 30 days supply | Qty: 60 | Fill #0

## 2016-08-10 MED FILL — POLYETHYLENE GLYCOL 3350 PO: 30 days supply | Qty: 527 | Fill #2

## 2016-08-10 MED FILL — ROSUVASTATIN CALCIUM 10 MG: 10 | 30 days supply | Qty: 30 | Fill #2

## 2016-08-11 ENCOUNTER — Encounter: Payer: Self-pay | Admitting: Adult Health

## 2016-08-11 ENCOUNTER — Ambulatory Visit (INDEPENDENT_AMBULATORY_CARE_PROVIDER_SITE_OTHER): Payer: 59 | Admitting: Adult Health

## 2016-08-11 VITALS — BP 126/61 | HR 58 | Ht 76.0 in | Wt 239.4 lb

## 2016-08-11 DIAGNOSIS — G629 Polyneuropathy, unspecified: Secondary | ICD-10-CM | POA: Diagnosis not present

## 2016-08-11 DIAGNOSIS — G3184 Mild cognitive impairment, so stated: Secondary | ICD-10-CM

## 2016-08-11 NOTE — Progress Notes (Signed)
PATIENT: [name] DOB: 05-21-1951  REASON FOR VISIT: follow up- mild cognitive impairment, brachial plexopathy HISTORY FROM: patient  HISTORY OF PRESENT ILLNESS: [name] is a 66 year old male with a history of mild cognitive impairment and brachial plexopathy. He returns today for follow-up. The patient reports that everything has remained relatively the same. He states that since his last visit with Dr. Brett Fairy he has had 3 confusional events. He states he had 2 that were very similar one occurred at work and another at home. He states that he became confused and then went into a dark room and his symptoms improved within 15-20 minutes. He states the other events occurred while he was watching TV. States that it felt as if a migraine may be occurring however it never came and then his symptoms resolved. He states all of these confusional events feel as if he is getting a migraine but the migraine never comes. He feels that his neuropathy overall has remained stable. He does noted more numbness in the feet. Occasionally he'll get pain in the right toe. He is not interested in trying any  medication for neuropathy. He returns today for an evaluation.  HISTORY [name] is a 66 year old male with a history of mild cognitive impairment and brachial plexopathy. He returns today for follow-up. The patient did go to neuropsychological testing. His results indicated that there may be several causes for his cognitive weaknesses such as anxious mood, obstructive sleep apnea, fatigue and possible hypoxic encephalopathy due to surgery in 2013. Dr. Georgiana Spinner also recommended considering treatment with CPAP again. He also felt that he would benefit from on mindful based stress reduction training or other relaxation techniques. He also felt that a possible stimulant could be beneficial. The patient feels that he continues to have issues with his thought process. He states that for example he was sending  an email out at work and was trying to find a person in his contacts that was gone from the position for 20 years. He states that there has also been occasions where he has put his cereal in his MiraLAX. He continues to take Xanax at night and feels that that helped with his anxiety. He has tried trazodone in the past but could not tolerate this medication as he felt that it caused his mind to race. At this time the patient does not want to take any new medication. The patient states he is also been diagnosed with peripheral neuropathy and he feels that over time the numbness has gotten worse. He occasionally has discomfort but very mild. Patient states that he will have some trouble with the right hand however he has not been doing his exercises that he learned after eating diagnosed with brachial plexopathy. He returns today for an evaluation.  HISTORY 03/04/15: [name] is a 66 year old male with a history of mild cognitive impairment and brachial plexopathy. He returns today for follow-up. The patient stopped the amitriptyline however he has not noticed any changes in his symptoms. Although he does not feel that amitriptyline was helpful for his migraines nor his neuropathy. He states he still having episodes where he feels confused, can't separate his thoughts. Also having some comprehension issues and difficulty with decision making. Patient also states that he has not been sleeping well. He uses Xanax to help him sleep at night. He states occasionally he will have to take a second dose in the middle the night. He states that if he does wake up  he typically has a hard time falling back asleep. Patient states that his migraines have continued but with less frequency. He states that he has 6-8 migraines a month. He has been tried on gabapentin but could not tolerate this medication. The patient also has a history of sleep apnea but could not tolerate the fascial mask nor nasal pillows. Patient reports that  he is no longer doing the exercises on the right hand that was taught to him in rehabilitation for his brachial plexopathy. He does feel that the right arm is feeling weaker. He is unsure if he should begin these exercises again. He returns today for an evaluation.  HISTORY  (DOHMEIER): [name] is a 66 y.o. male Is seen here as a referral by Dr Nils Pyle after reporting confusional episodes.  Formerly seen from Dr. Asa Lente for Brachial neuropathy, 08-26-14 Periods are episodes during which he feels confused, slightly disoriented to Time frame, he feels that he has trouble differentiating if he is dreaming, if he is awake, is in a memory . He reports also that his behaviors have changed but he cannot really explain those. #1 his leaving cabinet doors open which is not his habit usually. He misplaces things frequently and gets panicky when not able to locate them. Also there is a difficulty with abstraction. He mentioned that he hurt somebody referred to a saying ; "I've been there, done that and I brought the T-shirt " he could not understand what the "bought the T-shirt "part was about. He also has noticed that he sometimes gets off the elevator in the hospital and is not sure if he has to go left or right- it takes him a while to reorient himself. He also mentioned an event when he tried to and was preparing to mow his lawn, he had to fill the lawnmower with gasoline and held the petroleum can cap In one hand -he forgot what it was used for- until he realized that this was cap that closed the petroleum can. He has also trouble with recognize him some word sometimes in mid sentence sometimes word that he is just reading sometimes words he wants to use and speak. He is less comfortbale with change and new situations, any divergence form his routine.   Amnestic episodes. Family history positive for a mother with dementia in her 43 's. He spoke to his sister, and she has some  symptoms , too. He is weaning off amitriptyline.    REVIEW OF SYSTEMS: Out of a complete 14 system review of symptoms, the patient complains only of the following symptoms, and all other reviewed systems are negative.  Memory loss, dizziness, numbness, speech difficulty, nervous/anxious, walking difficulty, environmental allergies, cold intolerance, heat intolerance, insomnia, frequent waking, shortness of breath, eye itching  ALLERGIES: Allergies  Allergen Reactions  . Testosterone Shortness Of Breath    Medication:Androgel Pt states trouble breathing at night  . Codeine Nausea And Vomiting  . Gabapentin     "drunk" feeling  . Ultram [Tramadol] Nausea And Vomiting  . Albuterol Other (See Comments)    Panic attack    HOME MEDICATIONS: Outpatient Medications Prior to Visit  Medication Sig Dispense Refill  . ALPRAZolam (XANAX) 1 MG tablet TAKE 1/2 TABLET BY MOUTH 4 TIMES A DAY 60 tablet 3  . aspirin EC 81 MG tablet Take 81 mg by mouth daily with lunch.    [name] azelastine (ASTELIN) 0.1 % nasal spray PLACE 2 SPRAYS INTO BOTH NOSTRILS 2 (TWO) TIMES  DAILY. 30 mL 2  . calcium carbonate (TUMS EX) 750 MG chewable tablet Chew 1 tablet by mouth daily as needed for heartburn.     . cholecalciferol (VITAMIN D) 1000 UNITS tablet Take 2,000 Units by mouth daily.    . fexofenadine (ALLEGRA) 180 MG tablet Take 90 mg by mouth 2 (two) times daily.     . fluticasone (FLONASE) 50 MCG/ACT nasal spray PLACE 2 SPRAYS INTO BOTH NOSTRILS DAILY. 16 g 3  . metFORMIN (GLUCOPHAGE) 500 MG tablet Take by mouth daily with supper.    . metoprolol tartrate (LOPRESSOR) 25 MG tablet Take 1 tablet (25 mg total) by mouth 3 (three) times daily. 270 tablet 3  . montelukast (SINGULAIR) 10 MG tablet Take 0.5 tablets (5 mg total) by mouth 2 (two) times daily. 90 tablet 1  . Multiple Vitamin (MULTIVITAMIN WITH MINERALS) TABS Take 1 tablet by mouth daily.    [name] omega-3 acid ethyl esters (LOVAZA) 1 g capsule TAKE 1 CAPSULE BY  MOUTH 2 TIMES DAILY. 180 capsule 3  . OVER THE COUNTER MEDICATION Apply 1 application topically daily as needed (for acne). Oxy 10 vanishing cream    . polyethylene glycol powder (GLYCOLAX/MIRALAX) powder MIX 17G WITH LIQUID AND DRINK BY MOUTH DAILY. 527 g 5  . predniSONE (DELTASONE) 10 MG tablet TAKE 2 TABLETS BY MOUTH DAILY AS NEEDED FOR ASTHMA FLARE-UP 10 tablet 0  . rosuvastatin (CRESTOR) 10 MG tablet TAKE 1 TABLET BY MOUTH ONCE DAILY 30 tablet 5  . thiamine (VITAMIN B-1) 100 MG tablet Take 100 mg by mouth 2 (two) times daily at 10 AM and 5 PM.     . vitamin B-12 (CYANOCOBALAMIN) 500 MCG tablet Take 500-1,000 mcg by mouth daily.     [name] azithromycin (ZITHROMAX) 500 MG tablet Take 1 tablet (500 mg total) by mouth daily. If you have 3+loose stools/24hr. Can stop taking if diarrhea resolves (Patient not taking: Reported on 08/11/2016) 5 tablet 0  . EPIPEN 2-PAK 0.3 MG/0.3ML SOAJ injection Inject 0.3 mLs (0.3 mg total) into the muscle once. (Patient not taking: Reported on 08/11/2016) 2 Device 3   No facility-administered medications prior to visit.     PAST MEDICAL HISTORY: Past Medical History:  Diagnosis Date  . AAA (abdominal aortic aneurysm) ([place])   . ALLERGIC RHINITIS   . Anemia    post op, treated /w Fe  . Anxiety   . Arthritis    OA, hands, - thumbs- specifically  . Brachial plexopathy    double crush injury with median nerve damage on the right dominant hand.   [name] COPD (chronic obstructive pulmonary disease) ([place])    also states he has been told that he has slight emphysema   . Dissecting aortic aneurysm, thoracic ([place]) 02/20/2012   s/p emergent surgical repair  . DM2 (diabetes mellitus, type 2) ([place])   . DVT (deep venous thrombosis) ([facility])    coumadin- x3 months , post op  . ERECTILE DYSFUNCTION, ORGANIC   . GERD (gastroesophageal reflux disease)    related to diet & anxiety   . HYPERLIPIDEMIA   . Hypertension    pt. followwed by Dr. Johnsie Cancel  . MIGRAINE HEADACHE    induced  from physical , emotional stress , but sometimes occur randomly, also experiences BPPV  . OBSTRUCTIVE SLEEP APNEA 02/2008 sleep study   moderate & central- not able to tolerate CPAP   . Thyroid nodule 04/21/2015   2.3 cm on right, incidental finding on 03/2015 CT chest - f/u  US ordered  . Unspecified vitamin D deficiency     PAST SURGICAL HISTORY: Past Surgical History:  Procedure Laterality Date  . ABDOMINAL AORTIC ANEURYSM REPAIR N/A 11/01/2012   Procedure: ANEURYSM ABDOMINAL AORTIC REPAIR;  Surgeon: Mal Misty, MD;  Location: [facility];  Service: Vascular;  Laterality: N/A;  Resection and Grafting of Abdominal Aortic Aneurysm,(AORTA BI ILIAC)  . Nasal fx without repair    . THORACIC AORTIC ANEURYSM REPAIR  02/20/2012   Procedure: THORACIC ASCENDING ANEURYSM REPAIR (AAA);  Surgeon: Ivin Poot, MD;  Location: [facility];  Service: Open Heart Surgery;  Laterality: N/A;    FAMILY HISTORY: Family History  Problem Relation Age of Onset  . Diabetes Mother   . Stroke Mother   . Dementia Mother   . Deep vein thrombosis Father     PE  . Varicose Veins Father   . Heart disease Maternal Grandmother   . Heart disease Maternal Uncle   . Heart disease Paternal Uncle   . Heart disease Cousin   . Lung cancer Cousin     2 cousins    SOCIAL HISTORY: Social History   Social History  . Marital status: Divorced    Spouse name: N/A  . Number of children: 2  . Years of education: acad. deg.   Occupational History  . internal med resident [place] History Main Topics  . Smoking status: Former Smoker    Packs/day: 0.10    Types: Cigarettes    Quit date: 02/18/2012  . Smokeless tobacco: Never Used     Comment: 10 cigs/day, quit 02/2012 after TAA emergent repair  . Alcohol use 0.0 oz/week     Comment: shot of Tequila every day   . Drug use: No  . Sexual activity: Not on file   Other Topics Concern  . Not on file   Social History Narrative   Patient is single and lives at  home alone.    Arboriculturist.   Patient works at [employer] full time.   Right handed.   Caffeine one cup of coffee daily.       PHYSICAL EXAM  Vitals:   08/11/16 1527  BP: 126/61  Pulse: (!) 58  Weight: 239 lb 6.4 oz (108.6 kg)  Height: 6\' 4"  (1.93 m)   Body mass index is 29.14 kg/m.   Montreal Cognitive Assessment  08/11/2016 02/10/2016  Visuospatial/ Executive (0/5) 5 5  Naming (0/3) 3 3  Attention: Read list of digits (0/2) 2 2  Attention: Read list of letters (0/1) 1 1  Attention: Serial 7 subtraction starting at 100 (0/3) 2 2  Language: Repeat phrase (0/2) 2 1  Language : Fluency (0/1) 1 1  Abstraction (0/2) 2 2  Delayed Recall (0/5) 3 2  Orientation (0/6) 6 6  Total 27 25  Adjusted Score (based on education) 27 25     Generalized: Well developed, in no acute distress   Neurological examination  Mentation: Alert oriented to time, place, history taking. Follows all commands speech and language fluent Cranial nerve II-XII: Pupils were equal round reactive to light. Extraocular movements were full, visual field were full on confrontational test. Facial sensation and strength were normal. Uvula tongue midline. Head turning and shoulder shrug  were normal and symmetric. Motor: The motor testing reveals 5 over 5 strength of all 4 extremities. Good symmetric motor tone is noted throughout.  Sensory: Sensory testing is intact to soft touch on all 4 extremities. No  evidence of extinction is noted.  Coordination: Cerebellar testing reveals good finger-nose-finger and heel-to-shin bilaterally.  Gait and station: Gait is normal. Tandem gait is slightly unsteady. Romberg is negative. No drift is seen.  Reflexes: Deep tendon reflexes are symmetric and normal bilaterally.   DIAGNOSTIC DATA (LABS, IMAGING, TESTING) - I reviewed patient records, labs, notes, testing and imaging myself where available.  Lab Results  Component Value Date   WBC 7.0 07/16/2015   HGB 15.1  07/16/2015   HCT 45.4 07/16/2015   MCV 95.2 07/16/2015   PLT 178.0 07/16/2015      Component Value Date/Time   NA 139 01/13/2016 1020   K 4.5 01/13/2016 1020   CL 104 01/13/2016 1020   CO2 29 01/13/2016 1020   GLUCOSE 163 (H) 01/13/2016 1020   BUN 21 01/13/2016 1020   CREATININE 0.96 01/13/2016 1020   CREATININE 0.98 04/03/2015 0957   CALCIUM 9.2 01/13/2016 1020   PROT 6.9 01/13/2016 1020   ALBUMIN 4.1 01/13/2016 1020   AST 23 01/13/2016 1020   ALT 36 01/13/2016 1020   ALKPHOS 27 (L) 01/13/2016 1020   BILITOT 1.2 01/13/2016 1020   GFRNONAA 90 (L) 09/25/2013 1400   GFRNONAA >89 06/09/2012 1515   GFRAA >90 09/25/2013 1400   GFRAA >89 06/09/2012 1515   Lab Results  Component Value Date   CHOL 118 01/13/2016   HDL 38.90 (L) 01/13/2016   LDLCALC 61 01/13/2016   LDLDIRECT 114 (H) 03/21/2010   TRIG 92.0 01/13/2016   CHOLHDL 3 01/13/2016   Lab Results  Component Value Date   HGBA1C 6.6 (H) 01/13/2016   Lab Results  Component Value Date   [id number] 01/13/2016   Lab Results  Component Value Date   TSH 1.02 05/22/2015      ASSESSMENT AND PLAN 66 y.o. year old male  has a past medical history of AAA (abdominal aortic aneurysm) ([place]); ALLERGIC RHINITIS; Anemia; Anxiety; Arthritis; Brachial plexopathy; COPD (chronic obstructive pulmonary disease) ([place]); Dissecting aortic aneurysm, thoracic ([place]) (02/20/2012); DM2 (diabetes mellitus, type 2) ([place]); DVT (deep venous thrombosis) ([place]); ERECTILE DYSFUNCTION, ORGANIC; GERD (gastroesophageal reflux disease); HYPERLIPIDEMIA; Hypertension; MIGRAINE HEADACHE; OBSTRUCTIVE SLEEP APNEA (02/2008 sleep study); Thyroid nodule (04/21/2015); and Unspecified vitamin D deficiency. here with:  1. Peripheral neuropathy 2. Cognitive impairment  The patient's neuropathy has gotten slightly worse in regards to numbness. He denies any consistent discomfort with his neuropathy. He does not wish to try any medication at this time. The patient's  memory score has remained stable. He will follow-up in one year with Dr.Dohmeier  I spent 25 minutes with the patient 50% of this time was spent discussing the patient's diagnosis and treatment.     Ward Givens, MSN, NP-C 08/11/2016, 3:34 PM [facility] [address] [place], [place] [address] [phone]

## 2016-08-11 NOTE — Patient Instructions (Signed)
Memory score is stable If your symptoms worsen or you develop new symptoms please let us know.   

## 2016-08-18 ENCOUNTER — Ambulatory Visit (INDEPENDENT_AMBULATORY_CARE_PROVIDER_SITE_OTHER): Payer: 59 | Admitting: *Deleted

## 2016-08-18 DIAGNOSIS — Z23 Encounter for immunization: Secondary | ICD-10-CM

## 2016-08-18 DIAGNOSIS — Z9189 Other specified personal risk factors, not elsewhere classified: Secondary | ICD-10-CM

## 2016-08-20 ENCOUNTER — Other Ambulatory Visit: Payer: Self-pay | Admitting: Cardiovascular Disease

## 2016-08-20 ENCOUNTER — Other Ambulatory Visit: Payer: Self-pay | Admitting: Internal Medicine

## 2016-08-20 DIAGNOSIS — M9903 Segmental and somatic dysfunction of lumbar region: Secondary | ICD-10-CM | POA: Diagnosis not present

## 2016-08-20 DIAGNOSIS — M9905 Segmental and somatic dysfunction of pelvic region: Secondary | ICD-10-CM | POA: Diagnosis not present

## 2016-08-20 DIAGNOSIS — M62830 Muscle spasm of back: Secondary | ICD-10-CM | POA: Diagnosis not present

## 2016-08-20 DIAGNOSIS — M9902 Segmental and somatic dysfunction of thoracic region: Secondary | ICD-10-CM | POA: Diagnosis not present

## 2016-08-20 DIAGNOSIS — M791 Myalgia: Secondary | ICD-10-CM | POA: Diagnosis not present

## 2016-08-20 MED FILL — FLUTICASONE PROP 50 MCG SPR: 50 | 30 days supply | Qty: 16 | Fill #2

## 2016-08-20 MED FILL — METOPROLOL TARTRATE 25 MG T: 25 | 90 days supply | Qty: 270 | Fill #0

## 2016-08-23 MED FILL — METFORMIN HCL ER 500 MG TAB: 500 | 90 days supply | Qty: 90 | Fill #2

## 2016-08-24 ENCOUNTER — Encounter: Payer: Self-pay | Admitting: Sports Medicine

## 2016-08-24 ENCOUNTER — Ambulatory Visit (INDEPENDENT_AMBULATORY_CARE_PROVIDER_SITE_OTHER): Payer: 59 | Admitting: Sports Medicine

## 2016-08-24 DIAGNOSIS — M545 Low back pain, unspecified: Secondary | ICD-10-CM | POA: Insufficient documentation

## 2016-08-24 DIAGNOSIS — G8929 Other chronic pain: Secondary | ICD-10-CM | POA: Diagnosis not present

## 2016-08-24 DIAGNOSIS — M79671 Pain in right foot: Secondary | ICD-10-CM | POA: Diagnosis not present

## 2016-08-24 NOTE — Progress Notes (Signed)
Subjective:    Patient ID: [name], male    DOB: Dec 19, 1950, 66 y.o.   MRN: [id number]   CC: right heel pain and low back pain  HPI: 66 y/o M presents for right heel pain and low back pain  Right heel pain - started 10 days ago \-he has had plantar fasciitis in the past and he was concerned he had it again - pain located most over right heel - he has tried stretching it and rolling his foot over a tennis ball and a frozen bottle of water - he feels his pain is getting a bit better - he can think of no injuries or changes in activities - foot did not swell     Back pain - back pain started 2-3 days after his foot pain started - he has a history of low back pain that feels similar to his previous episodes - no weakness or radiation of the pain to his legs - he does see a chiropractor which has been helpful in past for his back pain - he went to his chiropractor 4 days ago but but his back pain only minimally improved  Pertinent past medical history: none  Review of Systems  Per HPI    Objective:  BP (!) 137/55   Ht 6\' 4"  (1.93 m)   Wt 239 lb (108.4 kg)   BMI 29.09 kg/m  Vitals and nursing note reviewed  General: NAD MSK  Back - no tenderness to palpation over reported painful area - negative straight leg raise  Lower extremities Bilateral 5/5 strength of hip flexion, knee flexion and extension, foot plantar/dorsiflexion Normal appearance of right foot, no swelling or effusions noted TTP over posterior inferior heel consistent with area over the fat pad, not directly under the calcaneous  [place] [employer] bedside US performed which showed normal plantar fascia, achilles tendon and calcaneous. No abnormalities seen   Assessment & Plan:    Right foot pain Location of pain appears to be more over the fat pad. His pain is not consistent with plantar fasciitis given the location of his pain as well as his benign [place]. His pain is possibly due to some sort  of strain although he cannot think of any inciting event. His pain does appear to be improving without intervention - will start gell inserts to further cushion his foot to help with his pain - will follow as needed if his pain does not continue to improve  Low back pain Chronic low back pain usually managed by a chiropractor. Had a Xray of his back in 20156 which showed facet joint hypertrophy with narrowing of the L3-4, L4-5 and L5-S1 spaces. He does not have any radiculopathy or weakness - will continue to follow s needed for likely arthritic back - he may continue to see his chiropractor as needed for back pain - will follow with [place] for worsening back pain    Kianna Billet A. Lincoln Brigham MD, [facility] Resident PGY-3 Pager [phone]  Patient seen and evaluated with the resident. I agree with the above plan of care. Right calcaneus pain is not consistent with plantar fasciitis nor with Achilles tendinitis. His symptoms are slowly improving so we will simply treat this with a gel cup for comfort and take a watchful waiting approach. His low back pain is chronic. He has had success with chiropractic treatment in the past. I recommended that he try one more chiropractic session and if he continues to have pain afterwards  then we could consider an appointment with Dr. Balinda Quails for osteopathic manipulation prior to imaging. Patient will follow-up as needed.

## 2016-08-24 NOTE — Assessment & Plan Note (Signed)
Chronic low back pain usually managed by a chiropractor. Had a Xray of his back in 20156 which showed facet joint hypertrophy with narrowing of the L3-4, L4-5 and L5-S1 spaces. He does not have any radiculopathy or weakness - will continue to follow s needed for likely arthritic back - he may continue to see his chiropractor as needed for back pain - will follow with [place] for worsening back pain

## 2016-08-24 NOTE — Assessment & Plan Note (Signed)
Location of pain appears to be more over the fat pad. His pain is not consistent with plantar fasciitis given the location of his pain as well as his benign [place]. His pain is possibly due to some sort of strain although he cannot think of any inciting event. His pain does appear to be improving without intervention - will start gell inserts to further cushion his foot to help with his pain - will follow as needed if his pain does not continue to improve

## 2016-08-25 DIAGNOSIS — M9903 Segmental and somatic dysfunction of lumbar region: Secondary | ICD-10-CM | POA: Diagnosis not present

## 2016-08-25 DIAGNOSIS — M9902 Segmental and somatic dysfunction of thoracic region: Secondary | ICD-10-CM | POA: Diagnosis not present

## 2016-08-25 DIAGNOSIS — M9905 Segmental and somatic dysfunction of pelvic region: Secondary | ICD-10-CM | POA: Diagnosis not present

## 2016-08-25 DIAGNOSIS — M791 Myalgia: Secondary | ICD-10-CM | POA: Diagnosis not present

## 2016-08-25 DIAGNOSIS — M62830 Muscle spasm of back: Secondary | ICD-10-CM | POA: Diagnosis not present

## 2016-09-13 ENCOUNTER — Other Ambulatory Visit: Payer: Self-pay | Admitting: Internal Medicine

## 2016-09-13 MED FILL — ROSUVASTATIN CALCIUM 10 MG: 10 | 30 days supply | Qty: 30 | Fill #3

## 2016-09-13 MED FILL — ALPRAZolam 1 MG TABS: 1 | 30 days supply | Qty: 60 | Fill #1

## 2016-09-14 MED FILL — AZELASTINE HCL 137 MCG SPRY: 0.1 | 25 days supply | Qty: 30 | Fill #0

## 2016-09-14 MED FILL — MONTELUKAST SOD 10 MG TAB: 10 | 90 days supply | Qty: 90 | Fill #0

## 2016-09-30 MED FILL — FLUTICASONE PROP 50 MCG SPR: 50 | 30 days supply | Qty: 16 | Fill #3

## 2016-09-30 MED FILL — OMEGA-3 ETHYL ESTERS 1 GM C: 1 | 90 days supply | Qty: 180 | Fill #3

## 2016-10-01 DIAGNOSIS — E041 Nontoxic single thyroid nodule: Secondary | ICD-10-CM | POA: Diagnosis not present

## 2016-10-01 MED FILL — POLYETHYLENE GLYCOL 3350 PO: 30 days supply | Qty: 527 | Fill #3

## 2016-10-18 MED FILL — AZELASTINE HCL 137 MCG SPRY: 0.1 | 25 days supply | Qty: 30 | Fill #1

## 2016-10-18 MED FILL — ALPRAZolam 1 MG TABS: 1 | 30 days supply | Qty: 60 | Fill #2

## 2016-10-20 ENCOUNTER — Other Ambulatory Visit: Payer: Self-pay | Admitting: Internal Medicine

## 2016-10-22 MED FILL — EPINEPHRINE 0.3 MG AUTO-INJ: 0.3 | 30 days supply | Qty: 2 | Fill #0

## 2016-11-23 MED FILL — METFORMIN HCL ER 500 MG TAB: 500 | 90 days supply | Qty: 90 | Fill #3

## 2016-11-23 MED FILL — METOPROLOL TARTRATE 25 MG T: 25 | 90 days supply | Qty: 270 | Fill #1

## 2016-11-23 MED FILL — ALPRAZolam 1 MG TABS: 1 | 30 days supply | Qty: 60 | Fill #3

## 2016-11-24 ENCOUNTER — Other Ambulatory Visit: Payer: Self-pay | Admitting: Internal Medicine

## 2016-11-25 MED FILL — FLUTICASONE PROP 50 MCG SPR: 50 | 60 days supply | Qty: 16 | Fill #0

## 2016-11-29 MED FILL — AMOXICILLIN 500 MG CAPSULE: 500 | 3 days supply | Qty: 12 | Fill #1

## 2016-12-13 ENCOUNTER — Other Ambulatory Visit: Payer: Self-pay | Admitting: Internal Medicine

## 2016-12-13 MED FILL — MONTELUKAST SOD 10 MG TAB: 10 | 90 days supply | Qty: 90 | Fill #1

## 2016-12-13 MED FILL — ROSUVASTATIN CALCIUM 10 MG: 10 | 30 days supply | Qty: 30 | Fill #4

## 2016-12-13 MED FILL — POLYETHYLENE GLYCOL 3350 PO: 30 days supply | Qty: 527 | Fill #4

## 2016-12-13 NOTE — Telephone Encounter (Signed)
Please have patient check on refills before a new prescription is written.

## 2016-12-13 NOTE — Telephone Encounter (Signed)
Last dispensed 11/23/2016 60 tabs for 30 days and the website stated there is 3 refills left

## 2016-12-16 ENCOUNTER — Other Ambulatory Visit: Payer: Self-pay | Admitting: Internal Medicine

## 2016-12-27 ENCOUNTER — Other Ambulatory Visit: Payer: Self-pay | Admitting: Internal Medicine

## 2016-12-27 NOTE — Telephone Encounter (Signed)
[facility] advise, hasn't been seen since August 2017 Last dispensed 11/23/2016 with 60 tabs for 30 days

## 2016-12-28 MED FILL — ALPRAZolam 1 MG TABS: 1 | 30 days supply | Qty: 60 | Fill #0

## 2016-12-28 MED FILL — OMEGA-3 ETHYL ESTERS 1 GM C: 1 | 90 days supply | Qty: 180 | Fill #0

## 2016-12-28 NOTE — Telephone Encounter (Signed)
Rx faxed

## 2016-12-31 DIAGNOSIS — E119 Type 2 diabetes mellitus without complications: Secondary | ICD-10-CM | POA: Diagnosis not present

## 2016-12-31 DIAGNOSIS — H10413 Chronic giant papillary conjunctivitis, bilateral: Secondary | ICD-10-CM | POA: Diagnosis not present

## 2016-12-31 DIAGNOSIS — H401131 Primary open-angle glaucoma, bilateral, mild stage: Secondary | ICD-10-CM | POA: Diagnosis not present

## 2016-12-31 DIAGNOSIS — H25813 Combined forms of age-related cataract, bilateral: Secondary | ICD-10-CM | POA: Diagnosis not present

## 2016-12-31 DIAGNOSIS — H04123 Dry eye syndrome of bilateral lacrimal glands: Secondary | ICD-10-CM | POA: Diagnosis not present

## 2016-12-31 LAB — HM DIABETES EYE EXAM

## 2017-01-11 ENCOUNTER — Encounter (HOSPITAL_COMMUNITY): Payer: Self-pay | Admitting: Emergency Medicine

## 2017-01-11 ENCOUNTER — Telehealth: Payer: Self-pay | Admitting: Internal Medicine

## 2017-01-11 ENCOUNTER — Telehealth (HOSPITAL_COMMUNITY): Payer: Self-pay | Admitting: Emergency Medicine

## 2017-01-11 ENCOUNTER — Ambulatory Visit (HOSPITAL_COMMUNITY)
Admission: EM | Admit: 2017-01-11 | Discharge: 2017-01-11 | Disposition: A | Discharge status: Home / Self Care | Payer: 59 | Attending: Emergency Medicine | Admitting: Emergency Medicine

## 2017-01-11 ENCOUNTER — Ambulatory Visit (INDEPENDENT_AMBULATORY_CARE_PROVIDER_SITE_OTHER): Payer: 59

## 2017-01-11 DIAGNOSIS — K5900 Constipation, unspecified: Secondary | ICD-10-CM | POA: Diagnosis not present

## 2017-01-11 DIAGNOSIS — R109 Unspecified abdominal pain: Secondary | ICD-10-CM | POA: Diagnosis not present

## 2017-01-11 MED ORDER — BISACODYL 10 MG RE SUPP: 10.0000 mg | Freq: Every day | RECTAL | 0 refills | Status: DC | PRN

## 2017-01-11 MED ORDER — LACTULOSE 10 GM/15ML PO SOLN: 20.0000 g | Freq: Three times a day (TID) | ORAL | 0 refills | Status: DC

## 2017-01-11 MED FILL — GENERLAC 10 GM/15 ML SOLN: 10 | 3 days supply | Qty: 240 | Fill #0

## 2017-01-11 MED FILL — BISAC-EVAC 10 MG SUPP: 10 | 12 days supply | Qty: 12 | Fill #0

## 2017-01-11 NOTE — ED Provider Notes (Signed)
CSN: [id number]     Arrival date & time 01/11/17  1026 History   None    Chief Complaint  Patient presents with  . Constipation   (Consider location/radiation/quality/duration/timing/severity/associated sxs/prior Treatment) Patient c/o constipation.   The history is provided by the patient.  Constipation  Severity:  Severe Timing:  Constant Progression:  Waxing and waning Chronicity:  Recurrent Relieved by:  Nothing Ineffective treatments:  Miralax   Past Medical History:  Diagnosis Date  . AAA (abdominal aortic aneurysm) ([place])   . ALLERGIC RHINITIS   . Anemia    post op, treated /w Fe  . Anxiety   . Arthritis    OA, hands, - thumbs- specifically  . Brachial plexopathy    double crush injury with median nerve damage on the right dominant hand.   [name] COPD (chronic obstructive pulmonary disease) ([place])    also states he has been told that he has slight emphysema   . Dissecting aortic aneurysm, thoracic ([place]) 02/20/2012   s/p emergent surgical repair  . DM2 (diabetes mellitus, type 2) ([place])   . DVT (deep venous thrombosis) ([facility])    coumadin- x3 months , post op  . ERECTILE DYSFUNCTION, ORGANIC   . GERD (gastroesophageal reflux disease)    related to diet & anxiety   . HYPERLIPIDEMIA   . Hypertension    pt. followwed by Dr. Johnsie Cancel  . MIGRAINE HEADACHE    induced from physical , emotional stress , but sometimes occur randomly, also experiences BPPV  . OBSTRUCTIVE SLEEP APNEA 02/2008 sleep study   moderate & central- not able to tolerate CPAP   . Thyroid nodule 04/21/2015   2.3 cm on right, incidental finding on 03/2015 CT chest - f/u US ordered  . Unspecified vitamin D deficiency    Past Surgical History:  Procedure Laterality Date  . ABDOMINAL AORTIC ANEURYSM REPAIR N/A 11/01/2012   Procedure: ANEURYSM ABDOMINAL AORTIC REPAIR;  Surgeon: Mal Misty, MD;  Location: [facility] OR;  Service: Vascular;  Laterality: N/A;  Resection and Grafting of Abdominal Aortic Aneurysm,(AORTA BI  ILIAC)  . Nasal fx without repair    . THORACIC AORTIC ANEURYSM REPAIR  02/20/2012   Procedure: THORACIC ASCENDING ANEURYSM REPAIR (AAA);  Surgeon: Ivin Poot, MD;  Location: [facility];  Service: Open Heart Surgery;  Laterality: N/A;   Family History  Problem Relation Age of Onset  . Diabetes Mother   . Stroke Mother   . Dementia Mother   . Deep vein thrombosis Father        PE  . Varicose Veins Father   . Heart disease Maternal Grandmother   . Heart disease Maternal Uncle   . Heart disease Paternal Uncle   . Heart disease Cousin   . Lung cancer Cousin        2 cousins   Social History  Substance Use Topics  . Smoking status: Former Smoker    Packs/day: 0.10    Types: Cigarettes    Quit date: 02/18/2012  . Smokeless tobacco: Never Used     Comment: 10 cigs/day, quit 02/2012 after TAA emergent repair  . Alcohol use 0.0 oz/week     Comment: shot of Tequila every day     Review of Systems  Constitutional: Negative.   HENT: Negative.   Eyes: Negative.   Respiratory: Negative.   Cardiovascular: Negative.   Gastrointestinal: Positive for constipation.  Endocrine: Negative.   Genitourinary: Negative.   Musculoskeletal: Negative.   Allergic/Immunologic: Negative.   Neurological: Negative.  Allergies  Testosterone; Codeine; Gabapentin; Ultram [tramadol]; and Albuterol  Home Medications   Prior to Admission medications   Medication Sig Start Date End Date Taking? Authorizing Provider  ALPRAZolam Duanne Moron) 1 MG tablet TAKE 1/2 TABLET BY MOUTH 4 TIMES A DAY 12/28/16  Yes Golden Circle, FNP  aspirin EC 81 MG tablet Take 81 mg by mouth daily with lunch.   Yes [provider]  azelastine (ASTELIN) 0.1 % nasal spray PLACE 2 SPRAYS INTO BOTH NOSTRILS 2 TIMES DAILY. 09/14/16  Yes Hoyt Koch, MD  cholecalciferol (VITAMIN D) 1000 UNITS tablet Take 2,000 Units by mouth daily. 02/11/11  Yes Rowe Clack, MD  fexofenadine (ALLEGRA) 180 MG tablet Take 90 mg by  mouth 2 (two) times daily.    Yes [provider]  metFORMIN (GLUCOPHAGE) 500 MG tablet Take by mouth daily with supper.   Yes [provider]  metoprolol tartrate (LOPRESSOR) 25 MG tablet TAKE 1 TABLET BY MOUTH 3 TIMES DAILY. 08/20/16  Yes Josue Hector, MD  montelukast (SINGULAIR) 10 MG tablet TAKE 1/2 TABLET BY MOUTH 2 (TWO) TIMES DAILY. 09/14/16  Yes Hoyt Koch, MD  Multiple Vitamin (MULTIVITAMIN WITH MINERALS) TABS Take 1 tablet by mouth daily.   Yes [provider]  OVER THE COUNTER MEDICATION Apply 1 application topically daily as needed (for acne). Oxy 10 vanishing cream   Yes [provider]  polyethylene glycol powder (GLYCOLAX/MIRALAX) powder MIX 17G WITH LIQUID AND DRINK BY MOUTH DAILY. 03/31/16  Yes Hoyt Koch, MD  rosuvastatin (CRESTOR) 10 MG tablet TAKE 1 TABLET BY MOUTH ONCE DAILY 05/10/16  Yes Hoyt Koch, MD  thiamine (VITAMIN B-1) 100 MG tablet Take 100 mg by mouth 2 (two) times daily at 10 AM and 5 PM.    Yes [provider]  vitamin B-12 (CYANOCOBALAMIN) 500 MCG tablet Take 500-1,000 mcg by mouth daily.    Yes [provider]  azithromycin (ZITHROMAX) 500 MG tablet Take 1 tablet (500 mg total) by mouth daily. If you have 3+loose stools/24hr. Can stop taking if diarrhea resolves Patient not taking: Reported on 08/11/2016 01/28/16   Carlyle Basques, MD  bisacodyl (DULCOLAX) 10 MG suppository Place 1 suppository (10 mg total) rectally daily as needed for moderate constipation. 01/11/17   Lysbeth Penner, FNP  calcium carbonate (TUMS EX) 750 MG chewable tablet Chew 1 tablet by mouth daily as needed for heartburn.     [provider]  EPIPEN 2-PAK 0.3 MG/0.3ML SOAJ injection INJECT 0.3 MLS INTO THE MUSCLE ONCE. 10/22/16   Hoyt Koch, MD  fluticasone [facility]) 50 MCG/ACT nasal spray USE 2 SPRAYS IN [facility] NOSTRIL DAILY 08/20/16   Hoyt Koch, MD  fluticasone [facility]) 50 MCG/ACT  nasal spray USE 2 SPRAYS IN EACH NOSTRIL ONCE DAILY 11/25/16   Golden Circle, FNP  lactulose (CHRONULAC) 10 GM/15ML solution Take 30 mLs (20 g total) by mouth 3 (three) times daily. 01/11/17   Lysbeth Penner, FNP  omega-3 acid ethyl esters (LOVAZA) 1 g capsule TAKE 1 CAPSULE BY MOUTH 2 TIMES DAILY. 12/28/16   Golden Circle, FNP  predniSONE (DELTASONE) 10 MG tablet TAKE 2 TABLETS BY MOUTH DAILY AS NEEDED FOR ASTHMA FLARE-UP 05/11/16   Hoyt Koch, MD   Meds Ordered and Administered this Visit  Medications - No data to display  BP 137/61 (BP Location: Left Arm)   Pulse (!) 59   Temp 97.7 F (36.5 C) (Oral)   SpO2 98%  No data found.   Physical Exam  Constitutional: He appears well-developed and well-nourished.  HENT:  Head: Normocephalic and atraumatic.  Eyes: Pupils are equal, round, and reactive to light. Conjunctivae and EOM are normal.  Neck: Normal range of motion. Neck supple.  Cardiovascular: Normal rate, regular rhythm and normal heart sounds.   Pulmonary/Chest: Effort normal and breath sounds normal.  Abdominal: Soft. Bowel sounds are normal.  Nursing note and vitals reviewed.   Urgent Care Course     Procedures (including critical care time)  Labs Review Labs Reviewed - No data to display  Imaging Review Dg Abd 1 View  Result Date: 01/11/2017 CLINICAL DATA:  Ten day history of abdominal pain.  Constipation. EXAM: ABDOMEN - 1 VIEW COMPARISON:  CT abdomen and pelvis April 13, 2016 FINDINGS: There is diffuse stool throughout most of the colon. There is no bowel dilatation or air-fluid level to suggest bowel obstruction. No free air. There are surgical clips in the mid abdomen. There is a prominent phlebolith in the right pelvis. IMPRESSION: Diffuse stool throughout much of colon. No bowel obstruction or free air evident. Electronically Signed   By: Lowella Grip III M.D.   On: 01/11/2017 11:59     Visual Acuity Review  Right Eye Distance:    Left Eye Distance:   Bilateral Distance:    Right Eye Near:   Left Eye Near:    Bilateral Near:         MDM   1. Constipation, unspecified constipation type    Lactulose 30 grams po tid prn #242ml Dulcolax 10mg  PR qd prn #12  Push po fluids Continue Miralax      Lysbeth Penner, FNP 01/11/17 1221

## 2017-01-11 NOTE — Telephone Encounter (Signed)
Mr malburg called with concerns for medication and information he received from pharmacy.  Discussed medication and uses for him as well as other uses.  Also discussed how rapid suppositories would work

## 2017-01-11 NOTE — ED Triage Notes (Signed)
Pt reports constipation x10 days.  He has tried two enemas, peaches, nectarines, and 1/3 bottle of mag citrate.

## 2017-01-11 NOTE — Telephone Encounter (Signed)
Patient Name: [name]  DOB: 02/22/1951    Initial Comment [name] has been having constipation problems for 10 days. He feels like there is a blockage.   Nurse Assessment  Nurse: Leilani Merl, RN, Heather Date/Time (Eastern Time): 01/11/2017 9:51:27 AM  Confirm and document reason for call. If symptomatic, describe symptoms. ---[name] has been having constipation problems for 10 days. He feels like there is a blockage. He did have a very small BM yesterday  Does the patient have any new or worsening symptoms? ---Yes  Will a triage be completed? ---Yes  Related visit to physician within the last 2 weeks? ---No  Does the PT have any chronic conditions? (i.e. diabetes, asthma, etc.) ---Yes  List chronic conditions. ---See MR  Is this a behavioral health or substance abuse call? ---No     Guidelines    Guideline Title Affirmed Question Affirmed Notes  Constipation Abdomen is more swollen than usual    Final Disposition User   See Physician within 24 Hours [place], Therapist, sports, [facility]   Disagree/Comply: Comply

## 2017-01-14 ENCOUNTER — Encounter: Payer: Self-pay | Admitting: Nurse Practitioner

## 2017-01-14 ENCOUNTER — Ambulatory Visit (INDEPENDENT_AMBULATORY_CARE_PROVIDER_SITE_OTHER): Payer: 59 | Admitting: Nurse Practitioner

## 2017-01-14 VITALS — BP 120/58 | HR 56 | Temp 97.6°F | Ht 76.0 in | Wt 236.0 lb

## 2017-01-14 DIAGNOSIS — K5900 Constipation, unspecified: Secondary | ICD-10-CM

## 2017-01-14 MED ORDER — LACTULOSE 10 GM/15ML PO SOLN: 10.0000 g | Freq: Three times a day (TID) | ORAL | 0 refills | Status: DC

## 2017-01-14 MED FILL — GENERLAC 10 GM/15 ML SOLN: 10 | 5 days supply | Qty: 240 | Fill #0

## 2017-01-14 NOTE — Progress Notes (Signed)
Subjective:  Patient ID: [name], male    DOB: [birth year]  Age: 66 y.o. MRN: [id number]  CC: Constipation (constipation for 2 wks. went to urgent care--got med--lactulose and suppository--work a little. med consult)   HPI Constipation: Some BM with lactulose 2x/day and miralax once a day. Developed ABD bloating combination of miralax and lactulose. No blood in stool, no mucus in stool. No nausea, no diarrhea. Reports hx of rectal pain (resolved without any intervention). No FH of colon cancer or polyp or diverticulosis. cologuard done 01/2015 (normal). Will like to know if lactulose and dulcolax are enough to resolve constipation.  Outpatient Medications Prior to Visit  Medication Sig Dispense Refill  . ALPRAZolam (XANAX) 1 MG tablet TAKE 1/2 TABLET BY MOUTH 4 TIMES A DAY 60 tablet 0  . aspirin EC 81 MG tablet Take 81 mg by mouth daily with lunch.    [name] azelastine (ASTELIN) 0.1 % nasal spray PLACE 2 SPRAYS INTO BOTH NOSTRILS 2 TIMES DAILY. 30 mL 2  . bisacodyl (DULCOLAX) 10 MG suppository Place 1 suppository (10 mg total) rectally daily as needed for moderate constipation. 12 suppository 0  . calcium carbonate (TUMS EX) 750 MG chewable tablet Chew 1 tablet by mouth daily as needed for heartburn.     . cholecalciferol (VITAMIN D) 1000 UNITS tablet Take 2,000 Units by mouth daily.    [name] EPIPEN 2-PAK 0.3 MG/0.3ML SOAJ injection INJECT 0.3 MLS INTO THE MUSCLE ONCE. 2 Device 3  . fexofenadine (ALLEGRA) 180 MG tablet Take 90 mg by mouth 2 (two) times daily.     . fluticasone (FLONASE) 50 MCG/ACT nasal spray USE 2 SPRAYS IN EACH NOSTRIL DAILY 16 g 5  . fluticasone (FLONASE) 50 MCG/ACT nasal spray USE 2 SPRAYS IN EACH NOSTRIL ONCE DAILY 16 g 3  . metFORMIN (GLUCOPHAGE) 500 MG tablet Take by mouth daily with supper.    . metoprolol tartrate (LOPRESSOR) 25 MG tablet TAKE 1 TABLET BY MOUTH 3 TIMES DAILY. 270 tablet 1  . montelukast (SINGULAIR) 10 MG tablet TAKE 1/2 TABLET BY MOUTH 2  (TWO) TIMES DAILY. 90 tablet 1  . Multiple Vitamin (MULTIVITAMIN WITH MINERALS) TABS Take 1 tablet by mouth daily.    [name] omega-3 acid ethyl esters (LOVAZA) 1 g capsule TAKE 1 CAPSULE BY MOUTH 2 TIMES DAILY. 180 capsule 3  . OVER THE COUNTER MEDICATION Apply 1 application topically daily as needed (for acne). Oxy 10 vanishing cream    . polyethylene glycol powder (GLYCOLAX/MIRALAX) powder MIX 17G WITH LIQUID AND DRINK BY MOUTH DAILY. 527 g 5  . rosuvastatin (CRESTOR) 10 MG tablet TAKE 1 TABLET BY MOUTH ONCE DAILY 30 tablet 5  . thiamine (VITAMIN B-1) 100 MG tablet Take 100 mg by mouth 2 (two) times daily at 10 AM and 5 PM.     . vitamin B-12 (CYANOCOBALAMIN) 500 MCG tablet Take 500-1,000 mcg by mouth daily.     [name] lactulose (CHRONULAC) 10 GM/15ML solution Take 30 mLs (20 g total) by mouth 3 (three) times daily. 240 mL 0  . azithromycin (ZITHROMAX) 500 MG tablet Take 1 tablet (500 mg total) by mouth daily. If you have 3+loose stools/24hr. Can stop taking if diarrhea resolves (Patient not taking: Reported on 08/11/2016) 5 tablet 0  . predniSONE (DELTASONE) 10 MG tablet TAKE 2 TABLETS BY MOUTH DAILY AS NEEDED FOR ASTHMA FLARE-UP (Patient not taking: Reported on 01/14/2017) 10 tablet 0   No facility-administered medications prior to visit.     ROS See  HPI  Objective:  BP (!) 120/58   Pulse (!) 56   Temp 97.6 F (36.4 C)   Ht 6\' 4"  (1.93 m)   Wt 236 lb (107 kg)   SpO2 98%   BMI 28.73 kg/m   BP Readings from Last 3 Encounters:  01/14/17 (!) 120/58  01/11/17 137/61  08/24/16 (!) 137/55    Wt Readings from Last 3 Encounters:  01/14/17 236 lb (107 kg)  08/24/16 239 lb (108.4 kg)  08/11/16 239 lb 6.4 oz (108.6 kg)    Physical Exam  Constitutional: He is oriented to person, place, and time. No distress.  Cardiovascular: Normal rate.   Pulmonary/Chest: Effort normal.  Abdominal: Soft. Bowel sounds are normal. He exhibits distension. There is no tenderness. There is no rebound and no  guarding.  Neurological: He is alert and oriented to person, place, and time.  Vitals reviewed.   Lab Results  Component Value Date   WBC 7.0 07/16/2015   HGB 15.1 07/16/2015   HCT 45.4 07/16/2015   PLT 178.0 07/16/2015   GLUCOSE 163 (H) 01/13/2016   CHOL 118 01/13/2016   TRIG 92.0 01/13/2016   HDL 38.90 (L) 01/13/2016   LDLDIRECT 114 (H) 03/21/2010   LDLCALC 61 01/13/2016   ALT 36 01/13/2016   AST 23 01/13/2016   NA 139 01/13/2016   K 4.5 01/13/2016   CL 104 01/13/2016   CREATININE 0.96 01/13/2016   BUN 21 01/13/2016   CO2 29 01/13/2016   TSH 1.02 05/22/2015   PSA 0.47 01/02/2010   INR 1.19 11/01/2012   HGBA1C 6.6 (H) 01/13/2016   MICROALBUR 1.5 01/13/2015    Dg Abd 1 View  Result Date: 01/11/2017 CLINICAL DATA:  Ten day history of abdominal pain.  Constipation. EXAM: ABDOMEN - 1 VIEW COMPARISON:  CT abdomen and pelvis April 13, 2016 FINDINGS: There is diffuse stool throughout most of the colon. There is no bowel dilatation or air-fluid level to suggest bowel obstruction. No free air. There are surgical clips in the mid abdomen. There is a prominent phlebolith in the right pelvis. IMPRESSION: Diffuse stool throughout much of colon. No bowel obstruction or free air evident. Electronically Signed   By: Lowella Grip III M.D.   On: 01/11/2017 11:59    Assessment & Plan:   Aarik was seen today for constipation.  Diagnoses and all orders for this visit:  Constipation, unspecified constipation type -     lactulose (CHRONULAC) 10 GM/15ML solution; Take 15 mLs (10 g total) by mouth 3 (three) times daily.   I have changed Mr. Seawood's lactulose. I am also having him maintain his fexofenadine, cholecalciferol, aspirin EC, thiamine, OVER THE COUNTER MEDICATION, multivitamin with minerals, vitamin B-12, calcium carbonate, azithromycin, polyethylene glycol powder, metFORMIN, rosuvastatin, predniSONE, metoprolol tartrate, fluticasone, montelukast, azelastine, EPIPEN 2-PAK,  fluticasone, ALPRAZolam, omega-3 acid ethyl esters, and bisacodyl.  Meds ordered this encounter  Medications  . lactulose (CHRONULAC) 10 GM/15ML solution    Sig: Take 15 mLs (10 g total) by mouth 3 (three) times daily.    Dispense:  240 mL    Refill:  0    Order Specific Question:   Supervising Provider    Answer:   Cassandria Anger [[id number]]    Follow-up: Return if symptoms worsen or fail to improve.  Wilfred Lacy, NP

## 2017-01-14 NOTE — Patient Instructions (Addendum)
Continue Lactulose. Hold miralax while using lactulose. Start docusate sodium 100mg  OTC twice a day. Increase water intake.  With recent rectal pain and constipation, I recommend additional evaluation by GI. He declined referral to GI at this time. Let us know change you mind about GI referral.  Constipation, Adult Constipation is when a person has fewer bowel movements in a week than normal, has difficulty having a bowel movement, or has stools that are dry, hard, or larger than normal. Constipation may be caused by an underlying condition. It may become worse with age if a person takes certain medicines and does not take in enough fluids. Follow these instructions at home: Eating and drinking   Eat foods that have a lot of fiber, such as fresh fruits and vegetables, whole grains, and beans.  Limit foods that are high in fat, low in fiber, or overly processed, such as french fries, hamburgers, cookies, candies, and soda.  Drink enough fluid to keep your urine clear or pale yellow. General instructions  Exercise regularly or as told by your health care provider.  Go to the restroom when you have the urge to go. Do not hold it in.  Take over-the-counter and prescription medicines only as told by your health care provider. These include any fiber supplements.  Practice pelvic floor retraining exercises, such as deep breathing while relaxing the lower abdomen and pelvic floor relaxation during bowel movements.  Watch your condition for any changes.  Keep all follow-up visits as told by your health care provider. This is important. Contact a health care provider if:  You have pain that gets worse.  You have a fever.  You do not have a bowel movement after 4 days.  You vomit.  You are not hungry.  You lose weight.  You are bleeding from the anus.  You have thin, pencil-like stools. Get help right away if:  You have a fever and your symptoms suddenly get worse.  You  leak stool or have blood in your stool.  Your abdomen is bloated.  You have severe pain in your abdomen.  You feel dizzy or you faint. This information is not intended to replace advice given to you by your health care provider. Make sure you discuss any questions you have with your health care provider. Document Released: 02/27/2004 Document Revised: 12/19/2015 Document Reviewed: 11/19/2015 Elsevier Interactive Patient Education  2017 [employer].

## 2017-01-18 ENCOUNTER — Encounter: Payer: Self-pay | Admitting: Internal Medicine

## 2017-01-18 NOTE — Progress Notes (Signed)
Abstracted and sent to scan  

## 2017-01-27 ENCOUNTER — Ambulatory Visit (HOSPITAL_COMMUNITY): Payer: 59 | Attending: Cardiology

## 2017-01-27 ENCOUNTER — Other Ambulatory Visit: Payer: Self-pay

## 2017-01-27 DIAGNOSIS — R011 Cardiac murmur, unspecified: Secondary | ICD-10-CM | POA: Diagnosis not present

## 2017-01-27 DIAGNOSIS — I080 Rheumatic disorders of both mitral and aortic valves: Secondary | ICD-10-CM | POA: Diagnosis not present

## 2017-01-27 DIAGNOSIS — I359 Nonrheumatic aortic valve disorder, unspecified: Secondary | ICD-10-CM | POA: Diagnosis not present

## 2017-01-27 DIAGNOSIS — I420 Dilated cardiomyopathy: Secondary | ICD-10-CM | POA: Diagnosis not present

## 2017-01-31 ENCOUNTER — Other Ambulatory Visit: Payer: Self-pay | Admitting: Family

## 2017-01-31 MED FILL — ROSUVASTATIN CALCIUM 10 MG: 10 | 30 days supply | Qty: 30 | Fill #5

## 2017-01-31 MED FILL — AZELASTINE HCL 137 MCG SPRY: 0.1 | 25 days supply | Qty: 30 | Fill #2

## 2017-01-31 MED FILL — FLUTICASONE PROP 50 MCG SPR: 50 | 60 days supply | Qty: 16 | Fill #1

## 2017-01-31 MED FILL — POLYETHYLENE GLYCOL 3350 PO: 30 days supply | Qty: 527 | Fill #5

## 2017-01-31 NOTE — Telephone Encounter (Signed)
Check [place] registry last filled 12/28/2016...[name]

## 2017-02-01 MED FILL — ALPRAZolam 1 MG TABS: 1 | 30 days supply | Qty: 60 | Fill #0

## 2017-02-01 NOTE — Telephone Encounter (Signed)
Faxed script back to [place]...[name]

## 2017-02-22 ENCOUNTER — Other Ambulatory Visit: Payer: Self-pay | Admitting: Cardiovascular Disease

## 2017-02-22 ENCOUNTER — Other Ambulatory Visit: Payer: Self-pay | Admitting: Internal Medicine

## 2017-02-22 MED FILL — METFORMIN HCL ER 500 MG TAB: 500 | 90 days supply | Qty: 90 | Fill #0

## 2017-02-23 MED FILL — METOPROLOL TARTRATE 25 MG T: 25 | 90 days supply | Qty: 270 | Fill #0

## 2017-02-25 ENCOUNTER — Encounter: Payer: Self-pay | Admitting: Internal Medicine

## 2017-02-25 ENCOUNTER — Encounter: Payer: 59 | Admitting: Internal Medicine

## 2017-02-25 ENCOUNTER — Other Ambulatory Visit (INDEPENDENT_AMBULATORY_CARE_PROVIDER_SITE_OTHER): Payer: 59

## 2017-02-25 ENCOUNTER — Ambulatory Visit (INDEPENDENT_AMBULATORY_CARE_PROVIDER_SITE_OTHER): Payer: 59 | Admitting: Internal Medicine

## 2017-02-25 VITALS — BP 124/68 | HR 63 | Temp 97.6°F | Ht 76.0 in | Wt 235.0 lb

## 2017-02-25 DIAGNOSIS — E785 Hyperlipidemia, unspecified: Secondary | ICD-10-CM

## 2017-02-25 DIAGNOSIS — E041 Nontoxic single thyroid nodule: Secondary | ICD-10-CM | POA: Diagnosis not present

## 2017-02-25 DIAGNOSIS — E119 Type 2 diabetes mellitus without complications: Secondary | ICD-10-CM

## 2017-02-25 DIAGNOSIS — Z0000 Encounter for general adult medical examination without abnormal findings: Secondary | ICD-10-CM | POA: Diagnosis not present

## 2017-02-25 DIAGNOSIS — E559 Vitamin D deficiency, unspecified: Secondary | ICD-10-CM

## 2017-02-25 DIAGNOSIS — J4520 Mild intermittent asthma, uncomplicated: Secondary | ICD-10-CM | POA: Diagnosis not present

## 2017-02-25 DIAGNOSIS — Z23 Encounter for immunization: Secondary | ICD-10-CM

## 2017-02-25 LAB — CBC
HCT: 45.7 % (ref 39.0–52.0)
HEMOGLOBIN: 15.5 g/dL (ref 13.0–17.0)
MCHC: 33.8 g/dL (ref 30.0–36.0)
MCV: 94.5 fl (ref 78.0–100.0)
Platelets: 150 10*3/uL (ref 150.0–400.0)
RBC: 4.83 Mil/uL (ref 4.22–5.81)
RDW: 13.5 % (ref 11.5–15.5)
WBC: 6 10*3/uL (ref 4.0–10.5)

## 2017-02-25 LAB — LIPID PANEL
CHOL/HDL RATIO: 3
Cholesterol: 131 mg/dL (ref 0–200)
HDL: 45.4 mg/dL (ref >39.00)
LDL Cholesterol: 65 mg/dL (ref 0–99)
NONHDL: 85.13
Triglycerides: 102 mg/dL (ref 0.0–149.0)
VLDL: 20.4 mg/dL (ref 0.0–40.0)

## 2017-02-25 LAB — COMPREHENSIVE METABOLIC PANEL
ALT: 44 U/L (ref 0–53)
AST: 28 U/L (ref 0–37)
Albumin: 4.1 g/dL (ref 3.5–5.2)
Alkaline Phosphatase: 30 U/L — ABNORMAL LOW (ref 39–117)
BILIRUBIN TOTAL: 1 mg/dL (ref 0.2–1.2)
BUN: 26 mg/dL — ABNORMAL HIGH (ref 6–23)
CHLORIDE: 103 meq/L (ref 96–112)
CO2: 25 meq/L (ref 19–32)
CREATININE: 0.94 mg/dL (ref 0.40–1.50)
Calcium: 9.1 mg/dL (ref 8.4–10.5)
GFR: 85.93 mL/min (ref >60.00)
GLUCOSE: 205 mg/dL — AB (ref 70–99)
Potassium: 4 mEq/L (ref 3.5–5.1)
SODIUM: 137 meq/L (ref 135–145)
Total Protein: 6.9 g/dL (ref 6.0–8.3)

## 2017-02-25 LAB — HEMOGLOBIN A1C: Hgb A1c MFr Bld: 7 % — ABNORMAL HIGH (ref 4.6–6.5)

## 2017-02-25 LAB — VITAMIN D 25 HYDROXY (VIT D DEFICIENCY, FRACTURES): VITD: 39.49 ng/mL (ref 30.00–100.00)

## 2017-02-25 LAB — T4, FREE: Free T4: 0.83 ng/dL (ref 0.60–1.60)

## 2017-02-25 LAB — VITAMIN B12: Vitamin B-12: 438 pg/mL (ref 211–911)

## 2017-02-25 LAB — TSH: TSH: 1.2 u[IU]/mL (ref 0.35–4.50)

## 2017-02-25 NOTE — Assessment & Plan Note (Signed)
Checking vitamin D level and adjust therapy as needed.

## 2017-02-25 NOTE — Assessment & Plan Note (Signed)
Stable at this time no flare. Not using inhalers regularly.

## 2017-02-25 NOTE — Assessment & Plan Note (Signed)
Checking TSH and free T4, adjust as needed.

## 2017-02-25 NOTE — Patient Instructions (Signed)
We have given you the tetanus shot today and will check the labs.    Health Maintenance, Male A healthy lifestyle and preventive care is important for your health and wellness. Ask your health care provider about what schedule of regular examinations is right for you. What should I know about weight and diet? Eat a Healthy Diet  Eat plenty of vegetables, fruits, whole grains, low-fat dairy products, and lean protein.  Do not eat a lot of foods high in solid fats, added sugars, or salt.  Maintain a Healthy Weight Regular exercise can help you achieve or maintain a healthy weight. You should:  Do at least 150 minutes of exercise each week. The exercise should increase your heart rate and make you sweat (moderate-intensity exercise).  Do strength-training exercises at least twice a week.  Watch Your Levels of Cholesterol and Blood Lipids  Have your blood tested for lipids and cholesterol every 5 years starting at 64 years of age. If you are at high risk for heart disease, you should start having your blood tested when you are 64 years old. You may need to have your cholesterol levels checked more often if: ? Your lipid or cholesterol levels are high. ? You are older than 64 years of age. ? You are at high risk for heart disease.  What should I know about cancer screening? Many types of cancers can be detected early and may often be prevented. Lung Cancer  You should be screened every year for lung cancer if: ? You are a current smoker who has smoked for at least 30 years. ? You are a former smoker who has quit within the past 15 years.  Talk to your health care provider about your screening options, when you should start screening, and how often you should be screened.  Colorectal Cancer  Routine colorectal cancer screening usually begins at 64 years of age and should be repeated every 5-10 years until you are 64 years old. You may need to be screened more often if early forms of  precancerous polyps or small growths are found. Your health care provider may recommend screening at an earlier age if you have risk factors for colon cancer.  Your health care provider may recommend using home test kits to check for hidden blood in the stool.  A small camera at the end of a tube can be used to examine your colon (sigmoidoscopy or colonoscopy). This checks for the earliest forms of colorectal cancer.  Prostate and Testicular Cancer  Depending on your age and overall health, your health care provider may do certain tests to screen for prostate and testicular cancer.  Talk to your health care provider about any symptoms or concerns you have about testicular or prostate cancer.  Skin Cancer  Check your skin from head to toe regularly.  Tell your health care provider about any new moles or changes in moles, especially if: ? There is a change in a mole's size, shape, or color. ? You have a mole that is larger than a pencil eraser.  Always use sunscreen. Apply sunscreen liberally and repeat throughout the day.  Protect yourself by wearing long sleeves, pants, a wide-brimmed hat, and sunglasses when outside.  What should I know about heart disease, diabetes, and high blood pressure?  If you are 107-75 years of age, have your blood pressure checked every 3-5 years. If you are 32 years of age or older, have your blood pressure checked every year. You should have  your blood pressure measured twice-once when you are at a hospital or clinic, and once when you are not at a hospital or clinic. Record the average of the two measurements. To check your blood pressure when you are not at a hospital or clinic, you can use: ? An automated blood pressure machine at a pharmacy. ? A home blood pressure monitor.  Talk to your health care provider about your target blood pressure.  If you are between 45-79 years old, ask your health care provider if you should take aspirin to prevent heart  disease.  Have regular diabetes screenings by checking your fasting blood sugar level. ? If you are at a normal weight and have a low risk for diabetes, have this test once every three years after the age of 45. ? If you are overweight and have a high risk for diabetes, consider being tested at a younger age or more often.  A one-time screening for abdominal aortic aneurysm (AAA) by ultrasound is recommended for men aged 65-75 years who are current or former smokers. What should I know about preventing infection? Hepatitis B If you have a higher risk for hepatitis B, you should be screened for this virus. Talk with your health care provider to find out if you are at risk for hepatitis B infection. Hepatitis C Blood testing is recommended for:  Everyone born from 1945 through 1965.  Anyone with known risk factors for hepatitis C.  Sexually Transmitted Diseases (STDs)  You should be screened each year for STDs including gonorrhea and chlamydia if: ? You are sexually active and are younger than 64 years of age. ? You are older than 64 years of age and your health care provider tells you that you are at risk for this type of infection. ? Your sexual activity has changed since you were last screened and you are at an increased risk for chlamydia or gonorrhea. Ask your health care provider if you are at risk.  Talk with your health care provider about whether you are at high risk of being infected with HIV. Your health care provider may recommend a prescription medicine to help prevent HIV infection.  What else can I do?  Schedule regular health, dental, and eye exams.  Stay current with your vaccines (immunizations).  Do not use any tobacco products, such as cigarettes, chewing tobacco, and e-cigarettes. If you need help quitting, ask your health care provider.  Limit alcohol intake to no more than 2 drinks per day. One drink equals 12 ounces of beer, 5 ounces of wine, or 1 ounces of  hard liquor.  Do not use street drugs.  Do not share needles.  Ask your health care provider for help if you need support or information about quitting drugs.  Tell your health care provider if you often feel depressed.  Tell your health care provider if you have ever been abused or do not feel safe at home. This information is not intended to replace advice given to you by your health care provider. Make sure you discuss any questions you have with your health care provider. Document Released: 11/27/2007 Document Revised: 01/28/2016 Document Reviewed: 03/04/2015 Elsevier Interactive Patient Education  2018 [employer]  

## 2017-02-25 NOTE — Assessment & Plan Note (Signed)
Checking labs, counseled about shingrix. Gets flu shot at work. Given tdap at visit. Colonoscopy up to date. Counseled about sun safety and mole surveillance and dangers of distracted driving. Given screening recommendations.

## 2017-02-25 NOTE — Assessment & Plan Note (Signed)
Check lipid panel and adjust crestor as needed.

## 2017-02-25 NOTE — Progress Notes (Signed)
   Subjective:    Patient ID: [name], male    DOB: 1950-10-31, 66 y.o.   MRN: [id number]  HPI The patient is a 66 YO man coming in for wellness. Still feels like overall health is declining some. No social support network. Since his aneurysm repair he has struggled with problems. His anxiety is a little better lately.   Review of Systems  Constitutional: Positive for activity change and fatigue. Negative for appetite change, chills, fever and unexpected weight change.  HENT: Negative.   Eyes: Negative.   Respiratory: Positive for shortness of breath. Negative for cough and chest tightness.        With exertion, stable  Cardiovascular: Negative for chest pain, palpitations and leg swelling.  Gastrointestinal: Negative for abdominal distention, abdominal pain, constipation, diarrhea, nausea and vomiting.  Musculoskeletal: Positive for arthralgias and back pain.  Skin: Negative.   Neurological: Positive for dizziness and numbness. Negative for tremors, seizures and weakness.  Psychiatric/Behavioral: Positive for decreased concentration and dysphoric mood. Negative for agitation, behavioral problems, confusion, hallucinations, sleep disturbance and suicidal ideas. The patient is nervous/anxious.       Objective:   Physical Exam  Constitutional: He is oriented to person, place, and time. He appears well-developed and well-nourished.  HENT:  Head: Normocephalic and atraumatic.  Eyes: EOM are normal.  Neck: Normal range of motion.  Cardiovascular: Normal rate and regular rhythm.   Murmur heard. Pulmonary/Chest: Effort normal and breath sounds normal. No respiratory distress. He has no wheezes. He has no rales.  Abdominal: Soft. Bowel sounds are normal. He exhibits no distension. There is no tenderness. There is no rebound.  Musculoskeletal: He exhibits no edema.  Neurological: He is alert and oriented to person, place, and time. Coordination normal.  Skin: Skin is warm and dry.    Psychiatric: He has a normal mood and affect.   Vitals:   02/25/17 0929  BP: 124/68  Pulse: 63  Temp: 97.6 F (36.4 C)  TempSrc: Oral  SpO2: 98%  Weight: 235 lb (106.6 kg)  Height: 6\' 4"  (1.93 m)      Assessment & Plan:

## 2017-02-25 NOTE — Assessment & Plan Note (Signed)
Taking metformin 500 mg daily and checking HgA1c and adjust as needed.

## 2017-03-08 ENCOUNTER — Other Ambulatory Visit: Payer: Self-pay | Admitting: Family

## 2017-03-09 MED FILL — ALPRAZolam 1 MG TABS: 1 | 30 days supply | Qty: 60 | Fill #0

## 2017-03-09 NOTE — Telephone Encounter (Signed)
MD had already left so I called refill into [facility] pharmacy...[name]

## 2017-03-28 ENCOUNTER — Other Ambulatory Visit: Payer: Self-pay | Admitting: Internal Medicine

## 2017-03-28 MED FILL — OMEGA-3 ETHYL ESTERS 1 GM C: 1 | 90 days supply | Qty: 180 | Fill #1

## 2017-03-29 MED FILL — ROSUVASTATIN CALCIUM 10 MG: 10 | 90 days supply | Qty: 90 | Fill #0

## 2017-03-29 MED FILL — MONTELUKAST SOD 10 MG TAB: 10 | 90 days supply | Qty: 90 | Fill #0

## 2017-04-01 ENCOUNTER — Encounter: Payer: Self-pay | Admitting: Cardiovascular Disease

## 2017-04-04 MED FILL — PREVIDENT 5000 1.1% DRY MOU: 1.1 | 30 days supply | Qty: 100 | Fill #0

## 2017-04-05 ENCOUNTER — Other Ambulatory Visit: Payer: Self-pay | Admitting: Internal Medicine

## 2017-04-05 DIAGNOSIS — E041 Nontoxic single thyroid nodule: Secondary | ICD-10-CM

## 2017-04-06 ENCOUNTER — Other Ambulatory Visit: Payer: Self-pay | Admitting: Internal Medicine

## 2017-04-06 MED FILL — AZELASTINE HCL 137 MCG SPRY: 0.1 | 25 days supply | Qty: 30 | Fill #0

## 2017-04-06 MED FILL — FLUTICASONE PROP 50 MCG SPR: 50 | 30 days supply | Qty: 16 | Fill #2

## 2017-04-13 ENCOUNTER — Ambulatory Visit
Admission: RE | Admit: 2017-04-13 | Discharge: 2017-04-13 | Disposition: A | Discharge status: Home / Self Care | Payer: 59 | Source: Ambulatory Visit | Attending: Internal Medicine | Admitting: Internal Medicine

## 2017-04-13 ENCOUNTER — Other Ambulatory Visit: Payer: Self-pay | Admitting: Internal Medicine

## 2017-04-13 DIAGNOSIS — E042 Nontoxic multinodular goiter: Secondary | ICD-10-CM | POA: Diagnosis not present

## 2017-04-13 DIAGNOSIS — E041 Nontoxic single thyroid nodule: Secondary | ICD-10-CM

## 2017-04-13 NOTE — Progress Notes (Signed)
Patient ID: [name], male   DOB: [birth year], 66 y.o.   MRN: [id number]   65 y.o.  status post repair of type A ascending dissection 02/19/12 PVT  with Heaney shield graft used to replace ascending aorta and hemi-arch reconstruction, resuspension of aortic valve Had a residual 6cm infrarenal AAA Since surgery has  paresthesias in 4th and 5th digits on right hand Likely brachial plexus injury with axillary artery cannulation on right side.Still with tingling in fingers and bicipital weakness   Had staged  AAA repair by Dr Kellie Simmering 10/2012  F/U venous duplex of LLE today showed resolution of thrombus and no DVT on left  Coumadin stopped   Echo reviewed 01/27/17 EF normal mild AS mean gradient 11 mmHg mild AR and stable aortic root  Only complaint is fatigue   ROS: Denies fever, malais, weight loss, blurry vision, decreased visual acuity, cough, sputum, SOB, hemoptysis, pleuritic pain, palpitaitons, heartburn, abdominal pain, melena, lower extremity edema, claudication, or rash.  All other systems reviewed and negative  General: BP 124/70   Pulse 62   Ht 6\' 4"  (1.93 m)   Wt 236 lb 9.6 oz (107.3 kg)   SpO2 97%   BMI 28.80 kg/m  Affect appropriate Healthy:  appears stated age 4: normal Neck supple with no adenopathy JVP normal no bruits no thyromegaly Lungs clear with no wheezing and good diaphragmatic motion Heart:  S1/S2 AS/AR  murmur, no rub, gallop or click PMI normal post sternotomy  Abdomen: benighn, BS positve, no tenderness, no AAA no bruit.  No HSM or HJR Distal pulses intact with no bruits No edema Neuro non-focal Skin warm and dry No muscular weakness    Current Outpatient Medications  Medication Sig Dispense Refill  . ALPRAZolam (XANAX) 1 MG tablet TAKE 1/2 TABLET BY MOUTH 4 TIMES DAILY 60 tablet 3  . aspirin EC 81 MG tablet Take 81 mg by mouth daily with lunch.    [name] azelastine (ASTELIN) 0.1 % nasal spray PLACE 2 SPRAYS INTO BOTH NOSTRILS 2 TIMES DAILY. 30  mL 2  . bisacodyl (DULCOLAX) 10 MG suppository Place 1 suppository (10 mg total) rectally daily as needed for moderate constipation. 12 suppository 0  . calcium carbonate (TUMS EX) 750 MG chewable tablet Chew 1 tablet by mouth daily as needed for heartburn.     . cholecalciferol (VITAMIN D) 1000 UNITS tablet Take 2,000 Units by mouth daily.    [name] EPIPEN 2-PAK 0.3 MG/0.3ML SOAJ injection INJECT 0.3 MLS INTO THE MUSCLE ONCE. 2 Device 3  . fexofenadine (ALLEGRA) 180 MG tablet Take 90 mg by mouth 2 (two) times daily.     . fluticasone (FLONASE) 50 MCG/ACT nasal spray USE 2 SPRAYS IN EACH NOSTRIL DAILY 16 g 5  . metFORMIN (GLUCOPHAGE-XR) 500 MG 24 hr tablet Take 1 tablet (500 mg total) by mouth daily with breakfast. Keep 02/25/2017 appointment for further refills 90 tablet 0  . metoprolol tartrate (LOPRESSOR) 25 MG tablet TAKE 1 TABLET BY MOUTH 3 TIMES DAILY 270 tablet 1  . montelukast (SINGULAIR) 10 MG tablet TAKE 1/2 TABLET BY MOUTH 2 (TWO) TIMES DAILY. 90 tablet 1  . Multiple Vitamin (MULTIVITAMIN WITH MINERALS) TABS Take 1 tablet by mouth daily.    [name] omega-3 acid ethyl esters (LOVAZA) 1 g capsule TAKE 1 CAPSULE BY MOUTH 2 TIMES DAILY. 180 capsule 3  . OVER THE COUNTER MEDICATION Apply 1 application topically daily as needed (for acne). Oxy 10 vanishing cream    . polyethylene glycol  powder (GLYCOLAX/MIRALAX) powder Take 17 g as needed by mouth.  5  . PREVIDENT 5000 DRY MOUTH 1.1 % GEL dental gel APPLY THIN RIBBON AND BRUSH FOR 2 MINUTES DAILY IN PLACE OF CONVENTIONAL TOOTHPASTE  99  . rosuvastatin (CRESTOR) 10 MG tablet TAKE 1 TABLET BY MOUTH ONCE DAILY 30 tablet 5  . thiamine (VITAMIN B-1) 100 MG tablet Take 100 mg by mouth 2 (two) times daily at 10 AM and 5 PM.     . vitamin B-12 (CYANOCOBALAMIN) 500 MCG tablet Take 500-1,000 mcg by mouth daily.      No current facility-administered medications for this visit.     Allergies  Testosterone; Codeine; Gabapentin; Ultram [tramadol]; and  Albuterol  Electrocardiogram:  11/17/12 SR rate 68 normal ECG   04/15/14 SR no acute changes compared to June  07/04/15  SR rate 64 normal 04/18/17 SR rate 63 normal  Assessment and Plan Dissection:  Grafted aortic root  Echo 01/27/17 with stable mild AS mean gradient 11 mmHg Peak 21 mmHg no AR stable root size  AAA:  F/U CT per Dr Kellie Simmering for false lumen Type A Dissection: post repair re suspended AV mean gradient 11 mmHg mild AR by echo 01/27/17  Chest Pain Resolved normal myovue 2015 DM Discussed low carb diet.  Target hemoglobin A1c is 6.5 or less.  Continue current medications. Chol:    Cholesterol is at goal.  Continue current dose of statin and diet Rx.  No myalgias or side effects.  F/U  LFT's in 6 months. Lab Results  Component Value Date   LDLCALC 65 02/25/2017   DVT:   Resolved by Duplex off anticoagulaiton related to vascular surgery  Bruit:  01/09/16 duplex reviewed plaque no stenosis f/u 01/08/18 Cognitive:  Some issues with memory ? Migrainous aura f/u neuro More anxiety no social supports  Will consider stress testing in a year as last myovue was 2015  [employer]

## 2017-04-15 ENCOUNTER — Other Ambulatory Visit: Payer: Self-pay | Admitting: Cardiothoracic Surgery

## 2017-04-15 DIAGNOSIS — Z8679 Personal history of other diseases of the circulatory system: Secondary | ICD-10-CM

## 2017-04-15 NOTE — Telephone Encounter (Signed)
I did not see this in patients current med list so I routed it to [place]

## 2017-04-15 NOTE — Telephone Encounter (Signed)
Pt called in about the status on this med refill

## 2017-04-18 ENCOUNTER — Encounter: Payer: Self-pay | Admitting: Cardiovascular Disease

## 2017-04-18 ENCOUNTER — Ambulatory Visit (INDEPENDENT_AMBULATORY_CARE_PROVIDER_SITE_OTHER): Payer: 59 | Admitting: Cardiovascular Disease

## 2017-04-18 VITALS — BP 124/70 | HR 62 | Ht 76.0 in | Wt 236.6 lb

## 2017-04-18 DIAGNOSIS — I350 Nonrheumatic aortic (valve) stenosis: Secondary | ICD-10-CM | POA: Diagnosis not present

## 2017-04-18 MED FILL — POLYETHYLENE GLYCOL 3350 PO: 30 days supply | Qty: 527 | Fill #0

## 2017-04-18 NOTE — Patient Instructions (Addendum)

## 2017-04-21 MED FILL — ALPRAZolam 1 MG TABS: 1 | 30 days supply | Qty: 60 | Fill #1

## 2017-05-11 ENCOUNTER — Ambulatory Visit: Payer: 59 | Admitting: Cardiothoracic Surgery

## 2017-05-18 ENCOUNTER — Encounter: Payer: Self-pay | Admitting: Cardiothoracic Surgery

## 2017-05-18 ENCOUNTER — Ambulatory Visit: Payer: 59 | Admitting: Cardiothoracic Surgery

## 2017-05-18 ENCOUNTER — Ambulatory Visit
Admission: RE | Admit: 2017-05-18 | Discharge: 2017-05-18 | Disposition: A | Discharge status: Home / Self Care | Payer: 59 | Source: Ambulatory Visit | Attending: Cardiothoracic Surgery | Admitting: Cardiothoracic Surgery

## 2017-05-18 VITALS — BP 123/62 | HR 20 | Resp 20 | Ht 76.0 in | Wt 236.0 lb

## 2017-05-18 DIAGNOSIS — K5790 Diverticulosis of intestine, part unspecified, without perforation or abscess without bleeding: Secondary | ICD-10-CM | POA: Diagnosis not present

## 2017-05-18 DIAGNOSIS — Z8679 Personal history of other diseases of the circulatory system: Secondary | ICD-10-CM

## 2017-05-18 DIAGNOSIS — I7101 Dissection of thoracic aorta: Secondary | ICD-10-CM

## 2017-05-18 DIAGNOSIS — Z9889 Other specified postprocedural states: Secondary | ICD-10-CM | POA: Diagnosis not present

## 2017-05-18 DIAGNOSIS — I712 Thoracic aortic aneurysm, without rupture: Secondary | ICD-10-CM | POA: Diagnosis not present

## 2017-05-18 DIAGNOSIS — I71019 Dissection of thoracic aorta, unspecified: Secondary | ICD-10-CM

## 2017-05-18 MED ORDER — IOPAMIDOL (ISOVUE-370) INJECTION 76%
175.0000 mL | Freq: Once | INTRAVENOUS | Status: AC | PRN
  Administered 2017-05-18: 175 mL via INTRAVENOUS

## 2017-05-18 NOTE — Progress Notes (Signed)
PCP is Hoyt Koch, MD Referring Provider is Rowe Clack, MD  Chief Complaint  Patient presents with  . Thoracic Aortic Aneurysm    1 year f/u with Chest CTA    HPI: Patient returns for scheduled visit with annual CTA of the thoracic and abdominal aorta. Patient is status post repair of type I aortic dissection 5 years ago with a hemi-arch Dacron graft reconstruction and resuspension of the aortic valve. Subsequent to the aortic dissection the patient underwent AAA repair by Dr. Kellie Simmering.  The patient denies chest or abdominal pain. Echocardiogram 4 months ago shows normal LV function, mild aortic regurgitation, mild aortic stenosis  . CTA from  today shows an intact repair of the ascending aorta and proximal arch. The false lumen has totally thrombosed in the arch distally and in the descending thoracic aorta to just above the diaphragm were a false lumen reappears. The total diameter of the distal descending thoracic aorta including both the true lumen and thrombosed false lumen  measures 4.5 cm, up from 4.2 cm last year. The total diameter of the upper abdominal aorta including both true and false lumen appears to be stable from last year. The mesenteric and renal arteries appear to remain perfused from the true lumen.  Past Medical History:  Diagnosis Date  . AAA (abdominal aortic aneurysm) ([place])   . ALLERGIC RHINITIS   . Anemia    post op, treated /w Fe  . Anxiety   . Arthritis    OA, hands, - thumbs- specifically  . Brachial plexopathy    double crush injury with median nerve damage on the right dominant hand.   [name] COPD (chronic obstructive pulmonary disease) ([place])    also states he has been told that he has slight emphysema   . Dissecting aortic aneurysm, thoracic ([place]) 02/20/2012   s/p emergent surgical repair  . DM2 (diabetes mellitus, type 2) ([place])   . DVT (deep venous thrombosis) ([facility])    coumadin- x3 months , post op  . ERECTILE DYSFUNCTION, ORGANIC   .  GERD (gastroesophageal reflux disease)    related to diet & anxiety   . HYPERLIPIDEMIA   . Hypertension    pt. followwed by Dr. Johnsie Cancel  . MIGRAINE HEADACHE    induced from physical , emotional stress , but sometimes occur randomly, also experiences BPPV  . OBSTRUCTIVE SLEEP APNEA 02/2008 sleep study   moderate & central- not able to tolerate CPAP   . Thyroid nodule 04/21/2015   2.3 cm on right, incidental finding on 03/2015 CT chest - f/u US ordered  . Unspecified vitamin D deficiency     Past Surgical History:  Procedure Laterality Date  . ABDOMINAL AORTIC ANEURYSM REPAIR N/A 11/01/2012   Procedure: ANEURYSM ABDOMINAL AORTIC REPAIR;  Surgeon: Mal Misty, MD;  Location: [facility] OR;  Service: Vascular;  Laterality: N/A;  Resection and Grafting of Abdominal Aortic Aneurysm,(AORTA BI ILIAC)  . Nasal fx without repair    . THORACIC AORTIC ANEURYSM REPAIR  02/20/2012   Procedure: THORACIC ASCENDING ANEURYSM REPAIR (AAA);  Surgeon: Ivin Poot, MD;  Location: [place];  Service: Open Heart Surgery;  Laterality: N/A;    Family History  Problem Relation Age of Onset  . Diabetes Mother   . Stroke Mother   . Dementia Mother   . Deep vein thrombosis Father        PE  . Varicose Veins Father   . Heart disease Maternal Grandmother   . Heart disease  Maternal Uncle   . Heart disease Paternal Uncle   . Heart disease Cousin   . Lung cancer Cousin        2 cousins    Social History Social History   Tobacco Use  . Smoking status: Former Smoker    Packs/day: 0.10    Types: Cigarettes    Last attempt to quit: 02/18/2012    Years since quitting: 5.2  . Smokeless tobacco: Never Used  . Tobacco comment: 10 cigs/day, quit 02/2012 after TAA emergent repair  Substance Use Topics  . Alcohol use: Yes    Alcohol/week: 0.0 oz    Comment: shot of Tequila every day   . Drug use: No    Current Outpatient Medications  Medication Sig Dispense Refill  . ALPRAZolam (XANAX) 1 MG tablet TAKE 1/2 TABLET BY  MOUTH 4 TIMES DAILY 60 tablet 3  . aspirin EC 81 MG tablet Take 81 mg by mouth daily with lunch.    [name] azelastine (ASTELIN) 0.1 % nasal spray PLACE 2 SPRAYS INTO BOTH NOSTRILS 2 TIMES DAILY. 30 mL 2  . bisacodyl (DULCOLAX) 10 MG suppository Place 1 suppository (10 mg total) rectally daily as needed for moderate constipation. 12 suppository 0  . calcium carbonate (TUMS EX) 750 MG chewable tablet Chew 1 tablet by mouth daily as needed for heartburn.     . cholecalciferol (VITAMIN D) 1000 UNITS tablet Take 2,000 Units by mouth daily.    [name] EPIPEN 2-PAK 0.3 MG/0.3ML SOAJ injection INJECT 0.3 MLS INTO THE MUSCLE ONCE. 2 Device 3  . fexofenadine (ALLEGRA) 180 MG tablet Take 90 mg by mouth 2 (two) times daily.     . fluticasone (FLONASE) 50 MCG/ACT nasal spray USE 2 SPRAYS IN EACH NOSTRIL DAILY 16 g 5  . metFORMIN (GLUCOPHAGE-XR) 500 MG 24 hr tablet Take 1 tablet (500 mg total) by mouth daily with breakfast. Keep 02/25/2017 appointment for further refills 90 tablet 0  . metoprolol tartrate (LOPRESSOR) 25 MG tablet TAKE 1 TABLET BY MOUTH 3 TIMES DAILY 270 tablet 1  . montelukast (SINGULAIR) 10 MG tablet TAKE 1/2 TABLET BY MOUTH 2 (TWO) TIMES DAILY. 90 tablet 1  . Multiple Vitamin (MULTIVITAMIN WITH MINERALS) TABS Take 1 tablet by mouth daily.    [name] omega-3 acid ethyl esters (LOVAZA) 1 g capsule TAKE 1 CAPSULE BY MOUTH 2 TIMES DAILY. 180 capsule 3  . OVER THE COUNTER MEDICATION Apply 1 application topically daily as needed (for acne). Oxy 10 vanishing cream    . polyethylene glycol powder (GLYCOLAX/MIRALAX) powder MIX 17G WITH LIQUID AND DRINK BY MOUTH DAILY. 527 g 5  . polyethylene glycol powder (GLYCOLAX/MIRALAX) powder Take 17 g as needed by mouth.  5  . PREVIDENT 5000 DRY MOUTH 1.1 % GEL dental gel APPLY THIN RIBBON AND BRUSH FOR 2 MINUTES DAILY IN PLACE OF CONVENTIONAL TOOTHPASTE  99  . rosuvastatin (CRESTOR) 10 MG tablet TAKE 1 TABLET BY MOUTH ONCE DAILY 30 tablet 5  . thiamine (VITAMIN B-1) 100 MG  tablet Take 100 mg by mouth 2 (two) times daily at 10 AM and 5 PM.     . vitamin B-12 (CYANOCOBALAMIN) 500 MCG tablet Take 500-1,000 mcg by mouth daily.      No current facility-administered medications for this visit.     Allergies  Allergen Reactions  . Testosterone Shortness Of Breath    Medication:Androgel Pt states trouble breathing at night  . Codeine Nausea And Vomiting  . Gabapentin     "drunk" feeling  .  Ultram [Tramadol] Nausea And Vomiting  . Albuterol Other (See Comments)    Panic attack    Review of Systems  Patient has a thyroid nodule which is being watched by surgery The patient's diabetes remains under good control with A1c of 7.0 Pressure remains under good control with 25 mg twice a day metoprolol No hospitalizations for stroke, angina, abdominal pain BP 123/62   Pulse (!) 20   Resp 20 Comment: RA  Ht 6\' 4"  (1.93 m)   Wt 236 lb (107 kg)   SpO2 98%   BMI 28.73 kg/m  Physical Exam      Exam    General- alert and comfortable   Lungs- clear without rales, wheezes   Cor- regular rate and rhythm, no murmur , gallop   Abdomen- soft, non-tender   Extremities - warm, non-tender, minimal edema   Neuro- oriented, appropriate, no focal weakness   Diagnostic Tests: CT scan images personally reviewed with results as noted above. No significant or at risk changes from his scan from last year however the total diameter of the distal thoracic aorta is slightly enlarged  Impression: Status post repair of [place] type A aortic dissection Status post open repair of AAA Remaining -moderate thoracoabdominal aneurysm of the true and false lumen at the level of the distal descending thoracic and upper abdominal  aorta  Plan: Continue current medications and I would recommend another annual CTA of the thoracic and abdominal aorta   Len Childs, MD [facility] [phone]

## 2017-05-25 ENCOUNTER — Ambulatory Visit: Payer: 59 | Admitting: Vascular Surgery

## 2017-05-25 ENCOUNTER — Other Ambulatory Visit: Payer: Self-pay

## 2017-05-25 ENCOUNTER — Other Ambulatory Visit: Payer: Self-pay | Admitting: Internal Medicine

## 2017-05-25 ENCOUNTER — Encounter: Payer: Self-pay | Admitting: Vascular Surgery

## 2017-05-25 VITALS — BP 122/69 | HR 58 | Temp 96.6°F | Resp 18 | Ht 76.0 in | Wt 234.0 lb

## 2017-05-25 DIAGNOSIS — I714 Abdominal aortic aneurysm, without rupture, unspecified: Secondary | ICD-10-CM

## 2017-05-25 MED FILL — METFORMIN HCL ER 500 MG TAB: 500 | 90 days supply | Qty: 90 | Fill #0

## 2017-05-25 MED FILL — ALPRAZolam 1 MG TABS: 1 | 30 days supply | Qty: 60 | Fill #2

## 2017-05-25 MED FILL — METOPROLOL TARTRATE 25 MG T: 25 | 90 days supply | Qty: 270 | Fill #1

## 2017-05-25 NOTE — Progress Notes (Signed)
[facility] [place]  Patient name: [name] MRN: [id number] DOB: 1951-02-01 Sex: male  REASON FOR VISIT: Follow-up infrarenal aortic aneurysm repair and chronic thoracic dissection  HPI: [name] is a 66 y.o. male here today for follow-up.  He initially had had a sending arch repair for dissection and then had a infrarenal abdominal aortic aneurysm repair following repair of his type a a sending aortic dissection by Dr. Darcey Nora.  His infrarenal repair was with Dr. Kellie Simmering in May 2004 18.  He is continued to do well.  Is here today to discuss recent CT scan on 05/18/2017 for follow-up of his thoracic aorta and infrarenal.  He has no symptoms referable to his aneurysm or his dissection.  Past Medical History:  Diagnosis Date  . AAA (abdominal aortic aneurysm) ([place])   . ALLERGIC RHINITIS   . Anemia    post op, treated /w Fe  . Anxiety   . Arthritis    OA, hands, - thumbs- specifically  . Brachial plexopathy    double crush injury with median nerve damage on the right dominant hand.   [name] COPD (chronic obstructive pulmonary disease) ([place])    also states he has been told that he has slight emphysema   . Dissecting aortic aneurysm, thoracic ([place]) 02/20/2012   s/p emergent surgical repair  . DM2 (diabetes mellitus, type 2) ([place])   . DVT (deep venous thrombosis) ([facility])    coumadin- x3 months , post op  . ERECTILE DYSFUNCTION, ORGANIC   . GERD (gastroesophageal reflux disease)    related to diet & anxiety   . HYPERLIPIDEMIA   . Hypertension    pt. followwed by Dr. Johnsie Cancel  . MIGRAINE HEADACHE    induced from physical , emotional stress , but sometimes occur randomly, also experiences BPPV  . OBSTRUCTIVE SLEEP APNEA 02/2008 sleep study   moderate & central- not able to tolerate CPAP   . Thyroid nodule 04/21/2015   2.3 cm on right, incidental finding on 03/2015 CT chest - f/u US ordered  . Unspecified vitamin D  deficiency     Family History  Problem Relation Age of Onset  . Diabetes Mother   . Stroke Mother   . Dementia Mother   . Deep vein thrombosis Father        PE  . Varicose Veins Father   . Heart disease Maternal Grandmother   . Heart disease Maternal Uncle   . Heart disease Paternal Uncle   . Heart disease Cousin   . Lung cancer Cousin        2 cousins    SOCIAL HISTORY: Social History   Tobacco Use  . Smoking status: Former Smoker    Packs/day: 0.10    Types: Cigarettes    Last attempt to quit: 02/18/2012    Years since quitting: 5.2  . Smokeless tobacco: Never Used  . Tobacco comment: 10 cigs/day, quit 02/2012 after TAA emergent repair  Substance Use Topics  . Alcohol use: Yes    Alcohol/week: 0.0 oz    Comment: shot of Tequila every day     Allergies  Allergen Reactions  . Testosterone Shortness Of Breath    Medication:Androgel Pt states trouble breathing at night  . Codeine Nausea And Vomiting  . Gabapentin     "drunk" feeling  . Ultram [Tramadol] Nausea And Vomiting  . Albuterol Other (See Comments)    Panic attack    Current Outpatient Medications  Medication Sig  Dispense Refill  . ALPRAZolam (XANAX) 1 MG tablet TAKE 1/2 TABLET BY MOUTH 4 TIMES DAILY 60 tablet 3  . aspirin EC 81 MG tablet Take 81 mg by mouth daily with lunch.    [name] azelastine (ASTELIN) 0.1 % nasal spray PLACE 2 SPRAYS INTO BOTH NOSTRILS 2 TIMES DAILY. 30 mL 2  . bisacodyl (DULCOLAX) 10 MG suppository Place 1 suppository (10 mg total) rectally daily as needed for moderate constipation. 12 suppository 0  . calcium carbonate (TUMS EX) 750 MG chewable tablet Chew 1 tablet by mouth daily as needed for heartburn.     . cholecalciferol (VITAMIN D) 1000 UNITS tablet Take 2,000 Units by mouth daily.    [name] EPIPEN 2-PAK 0.3 MG/0.3ML SOAJ injection INJECT 0.3 MLS INTO THE MUSCLE ONCE. 2 Device 3  . fexofenadine (ALLEGRA) 180 MG tablet Take 90 mg by mouth 2 (two) times daily.     . fluticasone (FLONASE)  50 MCG/ACT nasal spray USE 2 SPRAYS IN EACH NOSTRIL DAILY 16 g 5  . metFORMIN (GLUCOPHAGE-XR) 500 MG 24 hr tablet Take 1 tablet (500 mg total) by mouth daily with breakfast. 90 tablet 3  . metoprolol tartrate (LOPRESSOR) 25 MG tablet TAKE 1 TABLET BY MOUTH 3 TIMES DAILY 270 tablet 1  . montelukast (SINGULAIR) 10 MG tablet TAKE 1/2 TABLET BY MOUTH 2 (TWO) TIMES DAILY. 90 tablet 1  . Multiple Vitamin (MULTIVITAMIN WITH MINERALS) TABS Take 1 tablet by mouth daily.    [name] omega-3 acid ethyl esters (LOVAZA) 1 g capsule TAKE 1 CAPSULE BY MOUTH 2 TIMES DAILY. 180 capsule 3  . OVER THE COUNTER MEDICATION Apply 1 application topically daily as needed (for acne). Oxy 10 vanishing cream    . polyethylene glycol powder (GLYCOLAX/MIRALAX) powder MIX 17G WITH LIQUID AND DRINK BY MOUTH DAILY. 527 g 5  . PREVIDENT 5000 DRY MOUTH 1.1 % GEL dental gel APPLY THIN RIBBON AND BRUSH FOR 2 MINUTES DAILY IN PLACE OF CONVENTIONAL TOOTHPASTE  99  . rosuvastatin (CRESTOR) 10 MG tablet TAKE 1 TABLET BY MOUTH ONCE DAILY 30 tablet 5  . thiamine (VITAMIN B-1) 100 MG tablet Take 100 mg by mouth 2 (two) times daily at 10 AM and 5 PM.     . vitamin B-12 (CYANOCOBALAMIN) 500 MCG tablet Take 500-1,000 mcg by mouth daily.      No current facility-administered medications for this visit.     REVIEW OF SYSTEMS:  [X]  denotes positive finding, [ ]  denotes negative finding Cardiac  Comments:  Chest pain or chest pressure: x   Shortness of breath upon exertion:    Short of breath when lying flat:    Irregular heart rhythm:        Vascular    Pain in calf, thigh, or hip brought on by ambulation: x   Pain in feet at night that wakes you up from your sleep:     Blood clot in your veins:    Leg swelling:           PHYSICAL EXAM: Vitals:   05/25/17 1621  BP: 122/69  Pulse: (!) 58  Resp: 18  Temp: (!) 96.6 F (35.9 C)  TempSrc: Oral  SpO2: 97%  Weight: 234 lb (106.1 kg)  Height: 6\' 4"  (1.93 m)    GENERAL: The patient is  a well-nourished male, in no acute distress. The vital signs are documented above. CARDIOVASCULAR: Carotid arteries without bruits bilaterally.  2+ radial 2+ femoral 2+ popliteal pulses and 2+ dorsalis pedis pulses  bilaterally.  Abdomen shows no evidence of aneurysm PULMONARY: There is good air exchange  MUSCULOSKELETAL: There are no major deformities or cyanosis. NEUROLOGIC: No focal weakness or paresthesias are detected. SKIN: There are no ulcers or rashes noted. PSYCHIATRIC: The patient has a normal affect.  DATA:  CT scan shows chronic thoracic dissection.  He has false lumen which is patent posteriorly with a retrograde feeling from his native artery at the level of his aortic aneurysm graft repair.  His mesenteric vessels and both renal arteries are filling off of his true lumen.  The overall size of his thoracic aorta is changed very slightly from 3.1-3.4 cm.  MEDICAL ISSUES: Stable overall with no significant change in his descending thoracic aorta.  He did have a copy of his report from a CT angiogram and had questions regarding coronary calcification.  He does not have any severe type symptoms and will discuss this when he sees Dr. Johnsie Cancel to determine if any further imaging is required.  Also has gallbladder sludge but no gallbladder symptoms.  Also has diverticulosis but no diverticulitis and also has a bilateral inguinal hernias with present.  I discussed all of these with the patient explained that there should be no consequence with these.  He was reassured with our discussion and will see the [place] again in 1 year with repeat CT scan    Rosetta Posner, MD [facility] Tel [phone] Pager [phone]

## 2017-06-03 NOTE — Progress Notes (Signed)
error    This encounter was created in error - please disregard.

## 2017-06-06 MED FILL — POLYETHYLENE GLYCOL 3350 PO: 30 days supply | Qty: 527 | Fill #1

## 2017-06-20 MED FILL — ROSUVASTATIN CALCIUM 10 MG: 10 | 90 days supply | Qty: 90 | Fill #1

## 2017-06-20 MED FILL — FLUTICASONE PROP 50 MCG SPR: 50 | 30 days supply | Qty: 16 | Fill #3

## 2017-06-20 MED FILL — MONTELUKAST SOD 10 MG TAB: 10 | 90 days supply | Qty: 90 | Fill #1

## 2017-06-20 MED FILL — OMEGA-3 ETHYL ESTERS 1 GM C: 1 | 90 days supply | Qty: 180 | Fill #2

## 2017-06-29 MED FILL — ALPRAZolam 1 MG TABS: 1 | 30 days supply | Qty: 60 | Fill #3

## 2017-08-08 ENCOUNTER — Other Ambulatory Visit: Payer: Self-pay | Admitting: Internal Medicine

## 2017-08-08 MED FILL — SM CLEARLAX POWDER: 30 days supply | Qty: 510 | Fill #2

## 2017-08-08 MED FILL — AZELASTINE HCL 137 MCG SPRY: 0.1 | 25 days supply | Qty: 30 | Fill #1

## 2017-08-08 MED FILL — ALPRAZolam 1 MG TABS: 1 | 30 days supply | Qty: 60 | Fill #0

## 2017-08-08 NOTE — Telephone Encounter (Signed)
Control database checked last refill: 06/29/2017 LOV: 02/25/2017

## 2017-08-11 ENCOUNTER — Other Ambulatory Visit: Payer: Self-pay | Admitting: Family

## 2017-08-17 ENCOUNTER — Encounter: Payer: Self-pay | Admitting: Neurology

## 2017-08-17 ENCOUNTER — Ambulatory Visit: Payer: 59 | Admitting: Neurology

## 2017-08-17 VITALS — BP 130/63 | HR 59 | Ht 76.0 in | Wt 237.0 lb

## 2017-08-17 DIAGNOSIS — G4733 Obstructive sleep apnea (adult) (pediatric): Secondary | ICD-10-CM | POA: Diagnosis not present

## 2017-08-17 DIAGNOSIS — R5382 Chronic fatigue, unspecified: Secondary | ICD-10-CM | POA: Diagnosis not present

## 2017-08-17 DIAGNOSIS — R6889 Other general symptoms and signs: Secondary | ICD-10-CM

## 2017-08-17 DIAGNOSIS — R4189 Other symptoms and signs involving cognitive functions and awareness: Secondary | ICD-10-CM

## 2017-08-17 DIAGNOSIS — I6782 Cerebral ischemia: Secondary | ICD-10-CM | POA: Insufficient documentation

## 2017-08-17 NOTE — Patient Instructions (Signed)

## 2017-08-17 NOTE — Progress Notes (Signed)
PATIENT: [name] DOB: 1950-09-06  REASON FOR VISIT: follow up- mild cognitive impairment, brachial plexopathy HISTORY FROM: patient  HISTORY OF PRESENT ILLNESS:  RV 08-17-2017, last visit was with Edman Circle, NP.  and dedicated to [facility].  [name] has followed here for peripheral nerve injuries as well as for a complex form of sleep apnea that was partially obstructive and partially central.   He is meanwhile 67 years of age and will turn 60 this November.  He lives alone, but has 2 daughters and now a grandchild on the way. He worries about dementia ever since heart surgery- He has difficulties with computer access. Password, user names, etc. 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 Forgot his daughter's birthday, misspelling, and he cannot recognize faces- but recalls names !  He recently mistook his cell phone for TV control. He feels fatigued, not refreshed or restored.  Bedtime 11- 11.30 PM, watches TV in den, no screen after 9.30, reads in a book, dim light, no late dinners, no alcohol, in a cool and quiet room.  Dr. Asa Lente prescribed Xanax and this has calmed him, helps with sleep. But he is worried about his cognitive function.  CPAP failed him, chin strap very uncomfortable -he wants to learn about dental or ENT treatments.    Montreal Cognitive Assessment  08/17/2017 08/11/2016 02/10/2016  Visuospatial/ Executive (0/5) 4 5 5   Naming (0/3) 3 3 3   Attention: Read list of digits (0/2) 2 2 2   Attention: Read list of letters (0/1) 0 1 1  Attention: Serial 7 subtraction starting at 100 (0/3) 2 2 2   Language: Repeat phrase (0/2) 1 2 1   Language : Fluency (0/1) 1 1 1   Abstraction (0/2) 2 2 2   Delayed Recall (0/5) 3 3 2   Orientation (0/6) 5 6 6   Total 23 27 25   Adjusted Score (based on education) - 7 25         [name] is a 67 year old male with a history of mild cognitive impairment and brachial plexopathy. He returns today for follow-up. The  patient reports that everything has remained relatively the same. He states that since his last visit with Dr. Brett Fairy he has had 3 confusional events. He states he had 2 that were very similar one occurred at work and another at home. He states that he became confused and then went into a dark room and his symptoms improved within 15-20 minutes. He states the other events occurred while he was watching TV. States that it felt as if a migraine may be occurring however it never came and then his symptoms resolved. He states all of these confusional events feel as if he is getting a migraine but the migraine never comes. He feels that his neuropathy overall has remained stable. He does noted more numbness in the feet. Occasionally he'll get pain in the right toe. He is not interested in trying any  medication for neuropathy. He returns today for an evaluation.  HISTORY [name] is a 67 year old male with a history of mild cognitive impairment and brachial plexopathy. He returns today for follow-up. The patient did go to neuropsychological testing. His results indicated that there may be several causes for his cognitive weaknesses such as anxious mood, obstructive sleep apnea, fatigue and possible hypoxic encephalopathy due to surgery in 2013. Dr. Georgiana Spinner also recommended considering treatment with CPAP again. He also felt that he would benefit from on mindful based stress reduction  training or other relaxation techniques. He also felt that a possible stimulant could be beneficial. The patient feels that he continues to have issues with his thought process. He states that for example he was sending an email out at work and was trying to find a person in his contacts that was gone from the position for 20 years. He states that there has also been occasions where he has put his cereal in his MiraLAX. He continues to take Xanax at night and feels that that helped with his anxiety. He has tried trazodone in the past  but could not tolerate this medication as he felt that it caused his mind to race. At this time the patient does not want to take any new medication. The patient states he is also been diagnosed with peripheral neuropathy and he feels that over time the numbness has gotten worse. He occasionally has discomfort but very mild. Patient states that he will have some trouble with the right hand however he has not been doing his exercises that he learned after eating diagnosed with brachial plexopathy. He returns today for an evaluation.  HISTORY 03/04/15: [name] is a 67 year old male with a history of mild cognitive impairment and brachial plexopathy. He returns today for follow-up. The patient stopped the amitriptyline however he has not noticed any changes in his symptoms. Although he does not feel that amitriptyline was helpful for his migraines nor his neuropathy. He states he still having episodes where he feels confused, can't separate his thoughts. Also having some comprehension issues and difficulty with decision making. Patient also states that he has not been sleeping well. He uses Xanax to help him sleep at night. He states occasionally he will have to take a second dose in the middle the night. He states that if he does wake up he typically has a hard time falling back asleep. Patient states that his migraines have continued but with less frequency. He states that he has 6-8 migraines a month. He has been tried on gabapentin but could not tolerate this medication. The patient also has a history of sleep apnea but could not tolerate the fascial mask nor nasal pillows. Patient reports that he is no longer doing the exercises on the right hand that was taught to him in rehabilitation for his brachial plexopathy. He does feel that the right arm is feeling weaker. He is unsure if he should begin these exercises again. He returns today for an evaluation.  HISTORY  (Savanah Bayles): [name] is a 67  y.o. male Is seen here as a referral by Dr Nils Pyle after reporting confusional episodes.  Formerly seen from Dr. Asa Lente for Brachial neuropathy, 08-26-14 Periods are episodes during which he feels confused, slightly disoriented to Time frame, he feels that he has trouble differentiating if he is dreaming, if he is awake, is in a memory . He reports also that his behaviors have changed but he cannot really explain those. #1 his leaving cabinet doors open which is not his habit usually. He misplaces things frequently and gets panicky when not able to locate them. Also there is a difficulty with abstraction. He mentioned that he hurt somebody referred to a saying ; "I've been there, done that and I brought the T-shirt " he could not understand what the "bought the T-shirt "part was about. He also has noticed that he sometimes gets off the elevator in the hospital and is not sure if he has to go left or  right- it takes him a while to reorient himself. He also mentioned an event when he tried to and was preparing to mow his lawn, he had to fill the lawnmower with gasoline and held the petroleum can cap In one hand -he forgot what it was used for- until he realized that this was cap that closed the petroleum can. He has also trouble with recognize him some word sometimes in mid sentence sometimes word that he is just reading sometimes words he wants to use and speak. He is less comfortbale with change and new situations, any divergence form his routine.   Amnestic episodes. Family history positive for a mother with dementia in her 79 's. He spoke to his sister, and she has some symptoms , too. He is weaning off amitriptyline.    REVIEW OF SYSTEMS: Out of a complete 14 system review of symptoms, the patient complains only of the following symptoms, and all other reviewed systems are negative. "PUMP BRAIN "  Memory loss, dizziness, numbness, environmental allergies, cold intolerance, heat  intolerance, insomnia, frequent waking, shortness of breath, eye itching  ALLERGIES: Allergies  Allergen Reactions  . Testosterone Shortness Of Breath    Medication:Androgel Pt states trouble breathing at night  . Codeine Nausea And Vomiting  . Gabapentin     "drunk" feeling  . Ultram [Tramadol] Nausea And Vomiting  . Albuterol Other (See Comments)    Panic attack    HOME MEDICATIONS: Outpatient Medications Prior to Visit  Medication Sig Dispense Refill  . ALPRAZolam (XANAX) 1 MG tablet TAKE 1/2 TABLET BY MOUTH 4 TIMES DAILY 60 tablet 3  . aspirin EC 81 MG tablet Take 81 mg by mouth daily with lunch.    [name] azelastine (ASTELIN) 0.1 % nasal spray PLACE 2 SPRAYS INTO BOTH NOSTRILS 2 TIMES DAILY. 30 mL 2  . bisacodyl (DULCOLAX) 10 MG suppository Place 1 suppository (10 mg total) rectally daily as needed for moderate constipation. 12 suppository 0  . calcium carbonate (TUMS EX) 750 MG chewable tablet Chew 1 tablet by mouth daily as needed for heartburn.     . cholecalciferol (VITAMIN D) 1000 UNITS tablet Take 2,000 Units by mouth daily.    [name] EPIPEN 2-PAK 0.3 MG/0.3ML SOAJ injection INJECT 0.3 MLS INTO THE MUSCLE ONCE. 2 Device 3  . fexofenadine (ALLEGRA) 180 MG tablet Take 90 mg by mouth 2 (two) times daily.     . fluticasone (FLONASE) 50 MCG/ACT nasal spray USE 2 SPRAYS IN EACH NOSTRIL DAILY 16 g 5  . metFORMIN (GLUCOPHAGE-XR) 500 MG 24 hr tablet Take 1 tablet (500 mg total) by mouth daily with breakfast. 90 tablet 3  . metoprolol tartrate (LOPRESSOR) 25 MG tablet TAKE 1 TABLET BY MOUTH 3 TIMES DAILY 270 tablet 1  . montelukast (SINGULAIR) 10 MG tablet TAKE 1/2 TABLET BY MOUTH 2 (TWO) TIMES DAILY. 90 tablet 1  . Multiple Vitamin (MULTIVITAMIN WITH MINERALS) TABS Take 1 tablet by mouth daily.    [name] omega-3 acid ethyl esters (LOVAZA) 1 g capsule TAKE 1 CAPSULE BY MOUTH 2 TIMES DAILY. 180 capsule 3  . OVER THE COUNTER MEDICATION Apply 1 application topically daily as needed (for acne). Oxy 10  vanishing cream    . polyethylene glycol powder (GLYCOLAX/MIRALAX) powder MIX 17G WITH LIQUID AND DRINK BY MOUTH DAILY. 527 g 5  . PREVIDENT 5000 DRY MOUTH 1.1 % GEL dental gel APPLY THIN RIBBON AND BRUSH FOR 2 MINUTES DAILY IN PLACE OF CONVENTIONAL TOOTHPASTE  99  . rosuvastatin (  CRESTOR) 10 MG tablet TAKE 1 TABLET BY MOUTH ONCE DAILY 30 tablet 5  . thiamine (VITAMIN B-1) 100 MG tablet Take 100 mg by mouth 2 (two) times daily at 10 AM and 5 PM.     . vitamin B-12 (CYANOCOBALAMIN) 500 MCG tablet Take 500-1,000 mcg by mouth daily.      No facility-administered medications prior to visit.     PAST MEDICAL HISTORY: Past Medical History:  Diagnosis Date  . AAA (abdominal aortic aneurysm) ([place])   . ALLERGIC RHINITIS   . Anemia    post op, treated /w Fe  . Anxiety   . Arthritis    OA, hands, - thumbs- specifically  . Brachial plexopathy    double crush injury with median nerve damage on the right dominant hand.   [name] COPD (chronic obstructive pulmonary disease) ([place])    also states he has been told that he has slight emphysema   . Dissecting aortic aneurysm, thoracic ([place]) 02/20/2012   s/p emergent surgical repair  . DM2 (diabetes mellitus, type 2) ([place])   . DVT (deep venous thrombosis) ([facility])    coumadin- x3 months , post op  . ERECTILE DYSFUNCTION, ORGANIC   . GERD (gastroesophageal reflux disease)    related to diet & anxiety   . HYPERLIPIDEMIA   . Hypertension    pt. followwed by Dr. Johnsie Cancel  . MIGRAINE HEADACHE    induced from physical , emotional stress , but sometimes occur randomly, also experiences BPPV  . OBSTRUCTIVE SLEEP APNEA 02/2008 sleep study   moderate & central- not able to tolerate CPAP   . Thyroid nodule 04/21/2015   2.3 cm on right, incidental finding on 03/2015 CT chest - f/u US ordered  . Unspecified vitamin D deficiency     PAST SURGICAL HISTORY: Past Surgical History:  Procedure Laterality Date  . ABDOMINAL AORTIC ANEURYSM REPAIR N/A 11/01/2012   Procedure:  ANEURYSM ABDOMINAL AORTIC REPAIR;  Surgeon: Mal Misty, MD;  Location: [facility] OR;  Service: Vascular;  Laterality: N/A;  Resection and Grafting of Abdominal Aortic Aneurysm,(AORTA BI ILIAC)  . Nasal fx without repair    . THORACIC AORTIC ANEURYSM REPAIR  02/20/2012   Procedure: THORACIC ASCENDING ANEURYSM REPAIR (AAA);  Surgeon: Ivin Poot, MD;  Location: [facility];  Service: Open Heart Surgery;  Laterality: N/A;    FAMILY HISTORY: Family History  Problem Relation Age of Onset  . Diabetes Mother   . Stroke Mother   . Dementia Mother   . Deep vein thrombosis Father        PE  . Varicose Veins Father   . Heart disease Maternal Grandmother   . Heart disease Maternal Uncle   . Heart disease Paternal Uncle   . Heart disease Cousin   . Lung cancer Cousin        2 cousins    SOCIAL HISTORY: Social History   Socioeconomic History  . Marital status: Divorced    Spouse name: Not on file  . Number of children: 2  . Years of education: acad. deg.  . Highest education level: Not on file  Social Needs  . Financial resource strain: Not on file  . Food insecurity - worry: Not on file  . Food insecurity - inability: Not on file  . Transportation needs - medical: Not on file  . Transportation needs - non-medical: Not on file  Occupational History  . Occupation: internal med resident    Employer: [employer]  Tobacco Use  .  Smoking status: Former Smoker    Packs/day: 0.10    Types: Cigarettes    Last attempt to quit: 02/18/2012    Years since quitting: 5.4  . Smokeless tobacco: Never Used  . Tobacco comment: 10 cigs/day, quit 02/2012 after TAA emergent repair  Substance and Sexual Activity  . Alcohol use: Yes    Alcohol/week: 0.0 oz    Comment: shot of Tequila every day   . Drug use: No  . Sexual activity: Not on file  Other Topics Concern  . Not on file  Social History Narrative   Patient is single and lives at home alone.    Arboriculturist.   Patient works at [employer] full  time.   Right handed.   Caffeine one cup of coffee daily.       PHYSICAL EXAM  Vitals:   08/17/17 1520  BP: 130/63  Pulse: (!) 59  Weight: 237 lb (107.5 kg)  Height: 6\' 4"  (1.93 m)   Body mass index is 28.85 kg/m.   Montreal Cognitive Assessment  08/17/2017 08/11/2016 02/10/2016  Visuospatial/ Executive (0/5) 4 5 5   Naming (0/3) 3 3 3   Attention: Read list of digits (0/2) 2 2 2   Attention: Read list of letters (0/1) 0 1 1  Attention: Serial 7 subtraction starting at 100 (0/3) 2 2 2   Language: Repeat phrase (0/2) 1 2 1   Language : Fluency (0/1) 1 1 1   Abstraction (0/2) 2 2 2   Delayed Recall (0/5) 3 3 2   Orientation (0/6) 5 6 6   Total 23 27 25   Adjusted Score (based on education) - 27 25     Generalized: Well developed, in no acute distress   Neurological examination  Mentation: Alert oriented to time, place, history taking. Follows all commands speech and language fluent Cranial nerve : He reports intact smell and taste.  Pupils were equal round reactive to light.  Extraocular movements were full, visual field were full on confrontational test. Facial sensation and strength were normal. Uvula tongue midline. Head turning and shoulder shrug  were normal and symmetric. Motor: 5/5  symmetric motor tone is noted throughout.  Sensory: Sensory testing is intact to soft touch on all 4 extremities. Numbness in either foot to vibration.  Coordination:  finger-nose- intact bilaterally.  Gait and station: Gait is normal. Tandem gait is slightly unsteady. Romberg is negative. No drift is seen.  Reflexes:  symmetric and normal bilaterally.   DIAGNOSTIC DATA (LABS, IMAGING, TESTING) - I reviewed patient records, labs, notes, testing and imaging myself where available.  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)   Lab Results  Component Value Date   WBC 6.0 02/25/2017   HGB 15.5 02/25/2017   HCT 45.7 02/25/2017   MCV 94.5  02/25/2017   PLT 150.0 02/25/2017      Component Value Date/Time   NA 137 02/25/2017 1038   K 4.0 02/25/2017 1038   CL 103 02/25/2017 1038   CO2 25 02/25/2017 1038   GLUCOSE 205 (H) 02/25/2017 1038   BUN 26 (H) 02/25/2017 1038   CREATININE 0.94 02/25/2017 1038   CREATININE 0.98 04/03/2015 0957   CALCIUM 9.1 02/25/2017 1038   PROT 6.9 02/25/2017 1038   ALBUMIN 4.1 02/25/2017 1038   AST 28 02/25/2017 1038   ALT 44 02/25/2017 1038   ALKPHOS 30 (L) 02/25/2017 1038   BILITOT 1.0 02/25/2017 1038   GFRNONAA 90 (L) 09/25/2013 1400  GFRNONAA >89 06/09/2012 1515   GFRAA >90 09/25/2013 1400   GFRAA >89 06/09/2012 1515   Lab Results  Component Value Date   CHOL 131 02/25/2017   HDL 45.40 02/25/2017   LDLCALC 65 02/25/2017   LDLDIRECT 114 (H) 03/21/2010   TRIG 102.0 02/25/2017   CHOLHDL 3 02/25/2017   Lab Results  Component Value Date   HGBA1C 7.0 (H) 02/25/2017   Lab Results  Component Value Date   VITAMINB12 438 02/25/2017   Lab Results  Component Value Date   TSH 1.20 02/25/2017    ASSESSMENT AND PLAN 67 y.o. year old male  has a past medical history of AAA (abdominal aortic aneurysm) ([place]), ALLERGIC RHINITIS, Anemia, Anxiety, Arthritis, Brachial plexopathy, COPD (chronic obstructive pulmonary disease) ([place]), Dissecting aortic aneurysm, thoracic ([place]) (02/20/2012), DM2 (diabetes mellitus, type 2) ([place]), DVT (deep venous thrombosis) ([place]), ERECTILE DYSFUNCTION, ORGANIC, GERD (gastroesophageal reflux disease), HYPERLIPIDEMIA, Hypertension, MIGRAINE HEADACHE, OBSTRUCTIVE SLEEP APNEA (02/2008 sleep study), Thyroid nodule (04/21/2015), and Unspecified vitamin D deficiency. here with:  1. Peripheral neuropathy declared as endocrine with finally diabetic levels of HBa1c.  2. Cognitive impairment now reaches early dementia stages- began after heart surgery" pump brain "  3. Fatigue, confusion- may be related to ischemic brain injury, may be to xanax - do not use Xanax in the day time.    4. Reportedly Complex Apnea ? I found 2009s PSG from [facility], Dr. Annamaria Boots interpreted , AHI  34/hr all OSA, and RDi almost doubled. No hypoxemia . Will need to repeat  PSG/ SPLIT and see if he can use a dental device.    I spent 45 minutes with the patient 50% of this time was spent discussing the patient's diagnosis and treatment.     Larey Seat, MD  08/17/2017, 4:01 PM [facility] [address] [place], [place] [address] [phone]

## 2017-08-23 ENCOUNTER — Encounter: Payer: Self-pay | Admitting: Internal Medicine

## 2017-08-23 ENCOUNTER — Other Ambulatory Visit: Payer: Self-pay

## 2017-08-23 ENCOUNTER — Other Ambulatory Visit: Payer: Self-pay | Admitting: Cardiovascular Disease

## 2017-08-23 MED ORDER — FLUTICASONE PROPIONATE 50 MCG/ACT NA SUSP: 2.0000 | Freq: Every day | NASAL | 5 refills | Status: DC

## 2017-08-23 MED FILL — FLUTICASONE PROP 50 MCG SPR: 50 | 30 days supply | Qty: 16 | Fill #0

## 2017-08-23 MED FILL — AMOXICILLIN 500 MG CAPSULE: 500 | 3 days supply | Qty: 12 | Fill #0

## 2017-08-23 MED FILL — PREVIDENT 5000 1.1% DRY MOU: 1.1 | 30 days supply | Qty: 100 | Fill #1

## 2017-08-23 MED FILL — METFORMIN HCL ER 500 MG TAB: 500 | 90 days supply | Qty: 90 | Fill #1

## 2017-08-24 MED FILL — METOPROLOL TARTRATE 25 MG T: 25 | 90 days supply | Qty: 270 | Fill #0

## 2017-09-19 ENCOUNTER — Other Ambulatory Visit: Payer: Self-pay | Admitting: Internal Medicine

## 2017-09-19 MED FILL — FLUTICASONE PROP 50 MCG SPR: 50 | 30 days supply | Qty: 16 | Fill #1

## 2017-09-19 MED FILL — OMEGA-3 ETHYL ESTERS 1 GM C: 1 | 90 days supply | Qty: 180 | Fill #3

## 2017-09-19 MED FILL — ROSUVASTATIN CALCIUM 10 MG: 10 | 30 days supply | Qty: 30 | Fill #0

## 2017-09-19 MED FILL — MONTELUKAST SOD 10 MG TAB: 10 | 90 days supply | Qty: 90 | Fill #0

## 2017-09-19 MED FILL — ALPRAZolam 1 MG TABS: 1 | 30 days supply | Qty: 60 | Fill #1

## 2017-10-10 MED FILL — SM CLEARLAX POWDER: 30 days supply | Qty: 510 | Fill #3

## 2017-10-28 ENCOUNTER — Ambulatory Visit (INDEPENDENT_AMBULATORY_CARE_PROVIDER_SITE_OTHER): Payer: 59 | Admitting: Neurology

## 2017-10-28 DIAGNOSIS — G4733 Obstructive sleep apnea (adult) (pediatric): Secondary | ICD-10-CM

## 2017-10-28 DIAGNOSIS — R6889 Other general symptoms and signs: Secondary | ICD-10-CM

## 2017-10-28 DIAGNOSIS — R4189 Other symptoms and signs involving cognitive functions and awareness: Secondary | ICD-10-CM

## 2017-10-28 DIAGNOSIS — R5382 Chronic fatigue, unspecified: Secondary | ICD-10-CM

## 2017-10-28 DIAGNOSIS — I6782 Cerebral ischemia: Secondary | ICD-10-CM

## 2017-10-31 MED FILL — ALPRAZolam 1 MG TABS: 1 | 30 days supply | Qty: 60 | Fill #2

## 2017-10-31 MED FILL — AZELASTINE HCL 137 MCG SPRY: 0.1 | 25 days supply | Qty: 30 | Fill #2

## 2017-10-31 NOTE — Procedures (Signed)
PATIENT'S NAME:  [name], [name] DOB:      [birth year]      MR#:    [id number]     DATE OF RECORDING: 10/28/2017 REFERRING M.D.:  Ward Givens, NP Study Performed:   Baseline Polysomnogram HISTORY: Mr. [name] has followed here for peripheral nerve injuries as well as for a complex form of sleep apnea that was partially obstructive and partially central, and he could not use CPAP. All this culminated after open heart surgery. He is meanwhile 67 years of age and will turn 87 this November. He worries about dementia ever since heart surgery- He has difficulties with computer access, remembering password, user names, etc. 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 Forgot his daughter's birthday, has been misspelling words that didn't give him trouble before.  He cannot recognize faces- but recalls names (Anosognosia) !  He recently mistook his cell phone for the TV control. He feels fatigued, not refreshed or restored.  Dr. Asa Lente prescribed Xanax and this has calmed him, helps with sleep. But he is worried about his cognitive function. CPAP failed him, chin strap very uncomfortable -he wants to learn about dental or ENT treatments.  DX:  OSA, Anxiety, COPD, Hypertension, and Migraine Headache.  The patient endorsed the Epworth Sleepiness Scale at - points.   The patient's weight 237 pounds with a height of 76 (inches), resulting in a BMI of 29.2 kg/m2. The patient's neck circumference measured 16 inches.  CURRENT MEDICATIONS: Xanax, Aspirin, Astelin, Dulcolax, Tums, Vitamin D, EpiPen, Allegra, Flonase, Glucophage, Lopressor, Singulair, Multivitamin, Lovaza, Miralax, Provident, Crestor, Vitamin B, Cyanocobalamin.   PROCEDURE:  This is a multichannel digital polysomnogram utilizing the Somnostar 11.2 system.  Electrodes and sensors were applied and monitored per AASM Specifications.   EEG, EOG, Chin and Limb EMG, were sampled at 200 Hz.  ECG, Snore and Nasal Pressure, Thermal Airflow,  Respiratory Effort, CPAP Flow and Pressure, Oximetry was sampled at 50 Hz. Digital video and audio were recorded.      BASELINE STUDY: Lights Out was at 22:59 and Lights On at 05:02.  Total recording time (TRT) was 363.5 minutes, with a total sleep time (TST) of 336 minutes.   The patient's sleep latency was 19 minutes.  REM latency was 114.5 minutes.  The sleep efficiency was 92.4 %.     SLEEP ARCHITECTURE: WASO (Wake after sleep onset) was 8 minutes.  There were 11.5 minutes in Stage N1, 281.5 minutes Stage N2, 0 minutes Stage N3 and 43 minutes in Stage REM.  The percentage of Stage N1 was 3.4%, Stage N2 was 83.8%, Stage N3 was 0% and Stage R (REM sleep) was 12.8%.   RESPIRATORY ANALYSIS:  There were a total of 11 respiratory events: There were 11 hypopneas with a hypopnea index of 2.0 /hour. The patient also had 0 respiratory event related arousals (RERAs).     The total APNEA/HYPOPNEA INDEX (AHI) was 2.0/hour and the total RESPIRATORY DISTURBANCE INDEX was 2.0 /hour.  1 event occurred in REM sleep and 20 events in NREM. The REM AHI was 1.4 /hour, versus a non-REM AHI of 2.0. The patient spent his total sleep time in the non- supine position ( 336 minutes) The supine AHI was 0.0 versus a non-supine AHI of 2.0.  OXYGEN SATURATION & C02:  The Wake baseline 02 saturation was 95%, with the lowest being 88%. Time spent below 89% saturation equaled 1 minute.   PERIODIC LIMB MOVEMENTS:  The patient had a total  of 5 Periodic Limb Movements.  The Periodic Limb Movement (PLM) index was 0.9 and the PLM Arousal index was 0.2/hour.  The arousals were noted as: 29 were spontaneous, 1 was associated with PLMs, and 0 were associated with respiratory events. Audio and video analysis did not show any abnormal or unusual movements, behaviors, phonations or vocalizations.   Loud Snoring was noted. EKG was in keeping with normal sinus rhythm and sinus bradycardia (48 bpm). Post-study, the patient indicated that sleep  was the same as usual.   IMPRESSION:  1. No clinically significant Obstructive or Central Sleep Apnea (OSA) noted. 2. No hypoxemia noted. 3. Loud snoring in non -supine sleep position.     RECOMMENDATIONS: There is no need for CPAP intervention, only snoring treatment should be considered. I suggest trying a dental device.    I certify that I have reviewed the entire raw data recording prior to the issuance of this report in accordance with the Standards of Accreditation of the American Academy of Sleep Medicine (AASM)    Larey Seat, MD   10-31-2017  Diplomat, American Board of Psychiatry and Neurology  Diplomat, American Board of [place] Director, [employer] [facility] [employer]

## 2017-11-01 ENCOUNTER — Telehealth: Payer: Self-pay | Admitting: Neurology

## 2017-11-01 NOTE — Telephone Encounter (Signed)
Called to review the patient sleep study. Pt answered but was unavailable to review the results at this time and will call back.

## 2017-11-01 NOTE — Telephone Encounter (Signed)
-----   Message from Larey Seat, MD sent at 10/31/2017  5:36 PM EDT ----- No apnea seen - will continue with sleep aid and snoring treatment, possibly by dental device.

## 2017-11-01 NOTE — Telephone Encounter (Signed)
Patient is returning your call and can be reached at work telephone number [phone].

## 2017-11-01 NOTE — Telephone Encounter (Signed)
Called the pt back and was able to review the pt sleep results with him. Pt verbalized understanding. Pt states he would like a copy of the sleep study mailed to his home. Pt had no questions at this time but was encouraged to call back if questions arise.

## 2017-11-14 MED FILL — METOPROLOL TARTRATE 25 MG T: 25 | 90 days supply | Qty: 270 | Fill #1

## 2017-11-14 MED FILL — METFORMIN HCL ER 500 MG TAB: 500 | 90 days supply | Qty: 90 | Fill #2

## 2017-11-24 MED FILL — SM CLEARLAX POWDER: 30 days supply | Qty: 510 | Fill #4

## 2017-11-24 MED FILL — FLUTICASONE PROP 50 MCG SPR: 50 | 30 days supply | Qty: 16 | Fill #2

## 2017-12-07 ENCOUNTER — Other Ambulatory Visit: Payer: Self-pay | Admitting: Internal Medicine

## 2017-12-07 MED FILL — ALPRAZolam 1 MG TABS: 1 | 30 days supply | Qty: 60 | Fill #3

## 2017-12-07 MED FILL — EPINEPHRINE 0.3 MG AUTO-INJ: 0.3 | 15 days supply | Qty: 2 | Fill #0

## 2017-12-19 MED FILL — MONTELUKAST SOD 10 MG TAB: 10 | 90 days supply | Qty: 90 | Fill #1

## 2017-12-29 ENCOUNTER — Other Ambulatory Visit: Payer: Self-pay | Admitting: Internal Medicine

## 2017-12-29 MED ORDER — OMEGA-3-ACID ETHYL ESTERS 1 G PO CAPS: 1.0000 g | ORAL_CAPSULE | Freq: Two times a day (BID) | ORAL | 3 refills | Status: DC

## 2017-12-29 MED FILL — OMEGA-3 ETHYL ESTER 1 GM CA: 1 | 90 days supply | Qty: 180 | Fill #0

## 2017-12-29 NOTE — Telephone Encounter (Signed)
[name] will get to this today

## 2017-12-29 NOTE — Telephone Encounter (Signed)
Is it okay to refill lovaza?

## 2017-12-29 NOTE — Telephone Encounter (Signed)
[name] is calling from [facility] pharmacy requesting Dr. Sharlet Salina to contact her. She states that the patient was requesting a refill and never mentioned prior authorization to them, and is unsure if he really needs prior authorization or if he was meaning just a refill. She is thinking he is confused. Please advise.    [name], [place] - [address] [facility].  [address] [place] [address]  Phone: [phone] Fax: [phone]

## 2017-12-29 NOTE — Telephone Encounter (Signed)
Copied from [place] [phone]. Topic: General - Other >> Dec 29, 2017 10:03 AM Judyann Munson wrote: Reason for CRM:   patient is calling and stating the medication omega-3 acid ethyl esters (LOVAZA) 1 g capsule is needing authorization. Please advise

## 2017-12-29 NOTE — Telephone Encounter (Signed)
PA started on CoverMyMeds KEY:  [id number]

## 2017-12-29 NOTE — Telephone Encounter (Signed)
Copied from [place] [id number]. Topic: Quick Communication - Rx Refill/Question >> Dec 29, 2017 10:14 AM Cecelia Byars, NT wrote: Medication omega-3 acid ethyl esters (LOVAZA) 1 g capsule  Has the patient contacted their pharmacy? yes  (Agent: If no, request that the patient contact the pharmacy for the refill (Agent: If yes, when and what did the pharmacy advise?  Preferred Pharmacy (with phone number or street name [place], [place] - [address]. [phone] (Phone) [phone] (Fax      Agent: Please be advised that RX refills may take up to 3 business days. We ask that you follow-up with your pharmacy.

## 2018-01-06 DIAGNOSIS — E119 Type 2 diabetes mellitus without complications: Secondary | ICD-10-CM | POA: Diagnosis not present

## 2018-01-06 DIAGNOSIS — H04123 Dry eye syndrome of bilateral lacrimal glands: Secondary | ICD-10-CM | POA: Diagnosis not present

## 2018-01-06 DIAGNOSIS — H25813 Combined forms of age-related cataract, bilateral: Secondary | ICD-10-CM | POA: Diagnosis not present

## 2018-01-06 DIAGNOSIS — H401131 Primary open-angle glaucoma, bilateral, mild stage: Secondary | ICD-10-CM | POA: Diagnosis not present

## 2018-01-06 DIAGNOSIS — H10413 Chronic giant papillary conjunctivitis, bilateral: Secondary | ICD-10-CM | POA: Diagnosis not present

## 2018-01-06 LAB — HM DIABETES EYE EXAM

## 2018-01-18 ENCOUNTER — Other Ambulatory Visit: Payer: Self-pay | Admitting: Internal Medicine

## 2018-01-18 NOTE — Telephone Encounter (Signed)
Control database checked last refill: 12/07/2017 LOV: 02/25/2017 NOV: 03/14/2018

## 2018-01-19 ENCOUNTER — Encounter: Payer: Self-pay | Admitting: Internal Medicine

## 2018-01-19 MED FILL — ALPRAZolam 1 MG TABS: 1 | 30 days supply | Qty: 60 | Fill #0

## 2018-01-19 NOTE — Progress Notes (Signed)
Abstracted and sent to scan  

## 2018-02-06 ENCOUNTER — Other Ambulatory Visit: Payer: Self-pay | Admitting: Internal Medicine

## 2018-02-06 MED FILL — AZELASTINE HCL 137 MCG/SPRA: 137 | 25 days supply | Qty: 30 | Fill #0

## 2018-02-06 MED FILL — SM CLEARLAX POWDER: 30 days supply | Qty: 510 | Fill #5

## 2018-02-06 MED FILL — ROSUVASTATIN CALCIUM 10 MG: 10 | 30 days supply | Qty: 30 | Fill #1

## 2018-02-06 MED FILL — METFORMIN HCL ER 500 MG TAB: 500 | 90 days supply | Qty: 90 | Fill #3

## 2018-02-06 MED FILL — PREVIDENT 5000 1.1% DRY MOU: 1.1 | 30 days supply | Qty: 100 | Fill #2

## 2018-02-27 MED FILL — METOPROLOL TARTRATE 25 MG T: 25 | 90 days supply | Qty: 270 | Fill #2

## 2018-03-02 ENCOUNTER — Other Ambulatory Visit: Payer: Self-pay | Admitting: Internal Medicine

## 2018-03-02 MED FILL — ALPRAZolam 1 MG TABS: 1 | 30 days supply | Qty: 60 | Fill #1

## 2018-03-02 MED FILL — MONTELUKAST SOD 10 MG TAB: 10 | 90 days supply | Qty: 90 | Fill #0

## 2018-03-03 ENCOUNTER — Encounter: Payer: 59 | Admitting: Internal Medicine

## 2018-03-14 ENCOUNTER — Other Ambulatory Visit (INDEPENDENT_AMBULATORY_CARE_PROVIDER_SITE_OTHER): Payer: 59

## 2018-03-14 ENCOUNTER — Ambulatory Visit (INDEPENDENT_AMBULATORY_CARE_PROVIDER_SITE_OTHER): Payer: 59 | Admitting: Internal Medicine

## 2018-03-14 ENCOUNTER — Encounter: Payer: 59 | Admitting: Internal Medicine

## 2018-03-14 ENCOUNTER — Encounter: Payer: Self-pay | Admitting: Internal Medicine

## 2018-03-14 VITALS — BP 140/90 | HR 53 | Temp 97.7°F | Ht 76.0 in | Wt 235.0 lb

## 2018-03-14 DIAGNOSIS — E559 Vitamin D deficiency, unspecified: Secondary | ICD-10-CM

## 2018-03-14 DIAGNOSIS — Z0000 Encounter for general adult medical examination without abnormal findings: Secondary | ICD-10-CM

## 2018-03-14 DIAGNOSIS — I2510 Atherosclerotic heart disease of native coronary artery without angina pectoris: Secondary | ICD-10-CM

## 2018-03-14 DIAGNOSIS — G6289 Other specified polyneuropathies: Secondary | ICD-10-CM

## 2018-03-14 DIAGNOSIS — R5382 Chronic fatigue, unspecified: Secondary | ICD-10-CM

## 2018-03-14 DIAGNOSIS — E119 Type 2 diabetes mellitus without complications: Secondary | ICD-10-CM | POA: Diagnosis not present

## 2018-03-14 DIAGNOSIS — J4520 Mild intermittent asthma, uncomplicated: Secondary | ICD-10-CM | POA: Diagnosis not present

## 2018-03-14 DIAGNOSIS — F419 Anxiety disorder, unspecified: Secondary | ICD-10-CM | POA: Diagnosis not present

## 2018-03-14 LAB — COMPREHENSIVE METABOLIC PANEL
ALBUMIN: 4.1 g/dL (ref 3.5–5.2)
ALT: 49 U/L (ref 0–53)
AST: 36 U/L (ref 0–37)
Alkaline Phosphatase: 29 U/L — ABNORMAL LOW (ref 39–117)
BUN: 23 mg/dL (ref 6–23)
CALCIUM: 8.9 mg/dL (ref 8.4–10.5)
CHLORIDE: 104 meq/L (ref 96–112)
CO2: 25 meq/L (ref 19–32)
Creatinine, Ser: 0.88 mg/dL (ref 0.40–1.50)
GFR: 92.42 mL/min (ref >60.00)
Glucose, Bld: 203 mg/dL — ABNORMAL HIGH (ref 70–99)
POTASSIUM: 4.1 meq/L (ref 3.5–5.1)
SODIUM: 137 meq/L (ref 135–145)
Total Bilirubin: 0.8 mg/dL (ref 0.2–1.2)
Total Protein: 6.7 g/dL (ref 6.0–8.3)

## 2018-03-14 LAB — LIPID PANEL
CHOLESTEROL: 120 mg/dL (ref 0–200)
HDL: 33.4 mg/dL — ABNORMAL LOW (ref >39.00)
LDL Cholesterol: 68 mg/dL (ref 0–99)
NonHDL: 86.95
TRIGLYCERIDES: 94 mg/dL (ref 0.0–149.0)
Total CHOL/HDL Ratio: 4
VLDL: 18.8 mg/dL (ref 0.0–40.0)

## 2018-03-14 LAB — MICROALBUMIN / CREATININE URINE RATIO
Creatinine,U: 158 mg/dL
MICROALB/CREAT RATIO: 1.1 mg/g (ref 0.0–30.0)
Microalb, Ur: 1.8 mg/dL (ref 0.0–1.9)

## 2018-03-14 LAB — CBC
HEMATOCRIT: 44.5 % (ref 39.0–52.0)
Hemoglobin: 15.1 g/dL (ref 13.0–17.0)
MCHC: 33.9 g/dL (ref 30.0–36.0)
MCV: 93.4 fl (ref 78.0–100.0)
Platelets: 147 10*3/uL — ABNORMAL LOW (ref 150.0–400.0)
RBC: 4.76 Mil/uL (ref 4.22–5.81)
RDW: 13.1 % (ref 11.5–15.5)
WBC: 5.6 10*3/uL (ref 4.0–10.5)

## 2018-03-14 LAB — T4, FREE: Free T4: 0.91 ng/dL (ref 0.60–1.60)

## 2018-03-14 LAB — TSH: TSH: 0.82 u[IU]/mL (ref 0.35–4.50)

## 2018-03-14 LAB — HEMOGLOBIN A1C: Hgb A1c MFr Bld: 7.6 % — ABNORMAL HIGH (ref 4.6–6.5)

## 2018-03-14 NOTE — Progress Notes (Addendum)
   Subjective:    Patient ID: [name], male    DOB: 1951/01/26, 67 y.o.   MRN: [id number]  HPI The patient is a 67 YO man coming in for physical. Some stable complaints.  PMH, [facility], social history reviewed and updated.   Review of Systems  Constitutional: Positive for fatigue. Negative for activity change and appetite change.  HENT: Negative.   Eyes: Negative.   Respiratory: Negative for cough, chest tightness and shortness of breath.   Cardiovascular: Negative for chest pain, palpitations and leg swelling.  Gastrointestinal: Negative for abdominal distention, abdominal pain, constipation, diarrhea, nausea and vomiting.  Musculoskeletal: Negative.   Skin: Negative.   Neurological: Positive for numbness.  Psychiatric/Behavioral: Positive for decreased concentration and dysphoric mood.      Objective:   Physical Exam  Constitutional: He is oriented to person, place, and time. He appears well-developed and well-nourished.  HENT:  Head: Normocephalic and atraumatic.  Eyes: EOM are normal.  Neck: Normal range of motion.  Cardiovascular: Normal rate and regular rhythm.  Pulmonary/Chest: Effort normal and breath sounds normal. No respiratory distress. He has no wheezes. He has no rales.  Abdominal: Soft. Bowel sounds are normal. He exhibits no distension. There is no tenderness. There is no rebound.  Musculoskeletal: He exhibits no edema.  Neurological: He is alert and oriented to person, place, and time. A cranial nerve deficit is present. Coordination normal.  Stable to mild progression of neuropathy  Skin: Skin is warm and dry.  Psychiatric: He has a normal mood and affect.   Vitals:   03/14/18 1605 03/14/18 1654  BP: (!) 150/78 140/90  Pulse: (!) 53   Temp: 97.7 F (36.5 C)   TempSrc: Oral   SpO2: 95%   Weight: 235 lb (106.6 kg)   Height: 6\' 4"  (1.93 m)       Assessment & Plan:

## 2018-03-14 NOTE — Patient Instructions (Signed)
We will check the labs today and call you back about the results.  Keep up the good work with walking.    Health Maintenance, Male A healthy lifestyle and preventive care is important for your health and wellness. Ask your health care provider about what schedule of regular examinations is right for you. What should I know about weight and diet? Eat a Healthy Diet  Eat plenty of vegetables, fruits, whole grains, low-fat dairy products, and lean protein.  Do not eat a lot of foods high in solid fats, added sugars, or salt.  Maintain a Healthy Weight Regular exercise can help you achieve or maintain a healthy weight. You should:  Do at least 150 minutes of exercise each week. The exercise should increase your heart rate and make you sweat (moderate-intensity exercise).  Do strength-training exercises at least twice a week.  Watch Your Levels of Cholesterol and Blood Lipids  Have your blood tested for lipids and cholesterol every 5 years starting at 65 years of age. If you are at high risk for heart disease, you should start having your blood tested when you are 65 years old. You may need to have your cholesterol levels checked more often if: ? Your lipid or cholesterol levels are high. ? You are older than 65 years of age. ? You are at high risk for heart disease.  What should I know about cancer screening? Many types of cancers can be detected early and may often be prevented. Lung Cancer  You should be screened every year for lung cancer if: ? You are a current smoker who has smoked for at least 30 years. ? You are a former smoker who has quit within the past 15 years.  Talk to your health care provider about your screening options, when you should start screening, and how often you should be screened.  Colorectal Cancer  Routine colorectal cancer screening usually begins at 65 years of age and should be repeated every 5-10 years until you are 65 years old. You may need to be  screened more often if early forms of precancerous polyps or small growths are found. Your health care provider may recommend screening at an earlier age if you have risk factors for colon cancer.  Your health care provider may recommend using home test kits to check for hidden blood in the stool.  A small camera at the end of a tube can be used to examine your colon (sigmoidoscopy or colonoscopy). This checks for the earliest forms of colorectal cancer.  Prostate and Testicular Cancer  Depending on your age and overall health, your health care provider may do certain tests to screen for prostate and testicular cancer.  Talk to your health care provider about any symptoms or concerns you have about testicular or prostate cancer.  Skin Cancer  Check your skin from head to toe regularly.  Tell your health care provider about any new moles or changes in moles, especially if: ? There is a change in a mole's size, shape, or color. ? You have a mole that is larger than a pencil eraser.  Always use sunscreen. Apply sunscreen liberally and repeat throughout the day.  Protect yourself by wearing long sleeves, pants, a wide-brimmed hat, and sunglasses when outside.  What should I know about heart disease, diabetes, and high blood pressure?  If you are 39-11 years of age, have your blood pressure checked every 3-5 years. If you are 73 years of age or older, have your  blood pressure checked every year. You should have your blood pressure measured twice-once when you are at a hospital or clinic, and once when you are not at a hospital or clinic. Record the average of the two measurements. To check your blood pressure when you are not at a hospital or clinic, you can use: ? An automated blood pressure machine at a pharmacy. ? A home blood pressure monitor.  Talk to your health care provider about your target blood pressure.  If you are between 22-7 years old, ask your health care provider if you  should take aspirin to prevent heart disease.  Have regular diabetes screenings by checking your fasting blood sugar level. ? If you are at a normal weight and have a low risk for diabetes, have this test once every three years after the age of 16. ? If you are overweight and have a high risk for diabetes, consider being tested at a younger age or more often.  A one-time screening for abdominal aortic aneurysm (AAA) by ultrasound is recommended for men aged 61-75 years who are current or former smokers. What should I know about preventing infection? Hepatitis B If you have a higher risk for hepatitis B, you should be screened for this virus. Talk with your health care provider to find out if you are at risk for hepatitis B infection. Hepatitis C Blood testing is recommended for:  Everyone born from [birth year] through 1963.  Anyone with known risk factors for hepatitis C.  Sexually Transmitted Diseases (STDs)  You should be screened each year for STDs including gonorrhea and chlamydia if: ? You are sexually active and are younger than 65 years of age. ? You are older than 65 years of age and your health care provider tells you that you are at risk for this type of infection. ? Your sexual activity has changed since you were last screened and you are at an increased risk for chlamydia or gonorrhea. Ask your health care provider if you are at risk.  Talk with your health care provider about whether you are at high risk of being infected with HIV. Your health care provider may recommend a prescription medicine to help prevent HIV infection.  What else can I do?  Schedule regular health, dental, and eye exams.  Stay current with your vaccines (immunizations).  Do not use any tobacco products, such as cigarettes, chewing tobacco, and e-cigarettes. If you need help quitting, ask your health care provider.  Limit alcohol intake to no more than 2 drinks per day. One drink equals 12 ounces of beer,  5 ounces of wine, or 1 ounces of hard liquor.  Do not use street drugs.  Do not share needles.  Ask your health care provider for help if you need support or information about quitting drugs.  Tell your health care provider if you often feel depressed.  Tell your health care provider if you have ever been abused or do not feel safe at home. This information is not intended to replace advice given to you by your health care provider. Make sure you discuss any questions you have with your health care provider. Document Released: 11/27/2007 Document Revised: 01/28/2016 Document Reviewed: 03/04/2015 Elsevier Interactive Patient Education  [employer].

## 2018-03-15 LAB — VITAMIN D 25 HYDROXY (VIT D DEFICIENCY, FRACTURES): VITD: 44.93 ng/mL (ref 30.00–100.00)

## 2018-03-15 LAB — VITAMIN B12: Vitamin B-12: 860 pg/mL (ref 211–911)

## 2018-03-17 DIAGNOSIS — I2510 Atherosclerotic heart disease of native coronary artery without angina pectoris: Secondary | ICD-10-CM | POA: Insufficient documentation

## 2018-03-17 NOTE — Assessment & Plan Note (Signed)
Flu shot up to date. Pneumonia up to date. Shingrix up to date. Tetanus up to date. Colonoscopy up to date. Counseled about sun safety and mole surveillance. Counseled about the dangers of distracted driving. Given 10 year screening recommendations.

## 2018-03-17 NOTE — Assessment & Plan Note (Signed)
Stable although this does limit him significantly in his life.

## 2018-03-17 NOTE — Assessment & Plan Note (Signed)
Painful although managing without medication currently.

## 2018-03-17 NOTE — Assessment & Plan Note (Signed)
No flare currently, triggered by allergens mostly.

## 2018-03-17 NOTE — Assessment & Plan Note (Addendum)
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 Could be related to diabetes. B12 monitored and checking today. Taking metformin and room to increase if needed. Goal <7.5. Taking statin and checking microalbumin to creatinine ratio. Adjust if needed.

## 2018-03-17 NOTE — Assessment & Plan Note (Signed)
Prior stress testing about 4 years ago and would recommend stress testing every 5 years which would be 2020. No current anginal symptoms.

## 2018-03-17 NOTE — Assessment & Plan Note (Signed)
Does not wish to add another medication currently but getting less panic episodes. Using xanax up to TID. More depression in the last year when thinking about retirement and current health state. Offered counseling and he will think about that.

## 2018-03-17 NOTE — Assessment & Plan Note (Signed)
Checking vitamin D level and adjust as needed.  

## 2018-03-18 IMAGING — CT CT ANGIO CHEST
2 of 14 series · 5 of 37 positions shown · IV contrast ([ID] ISOVUE 370)
Comparison: Multiple prior dating to the for study of 0155. Most
recent 04/13/2016

CLINICAL DATA: 66-year-old male with a history of aortic dissection
presenting 02/20/2012. Repair was hemi arch and aortic valve
resuspension.

EXAM:
CT ANGIOGRAPHY CHEST, ABDOMEN AND PELVIS
TECHNIQUE: Multidetector CT imaging through the chest, abdomen and pelvis was
performed using the standard protocol during bolus administration of
intravenous contrast. Multiplanar reconstructed images and MIPs were
obtained and reviewed to evaluate the vascular anatomy.
CONTRAST:  175mL YCNHFQ-33L IOPAMIDOL (YCNHFQ-33L) INJECTION 76%

[Series 3: angio · axial · 0.98mm/px · z∈[-396,-159]mm · 2 of 287 slices shown (1 of 2)]
[im 96/287  lung]
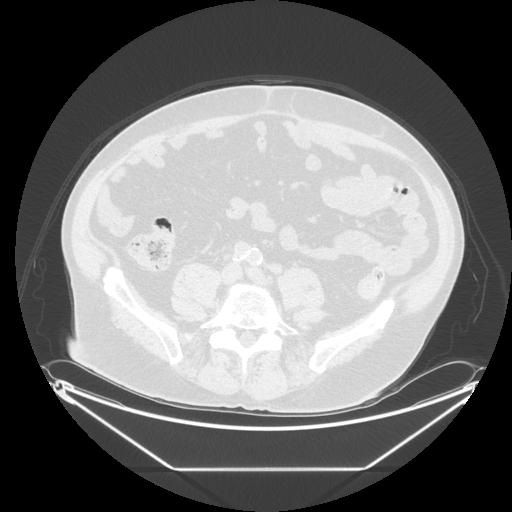
[im 191/287  lung]
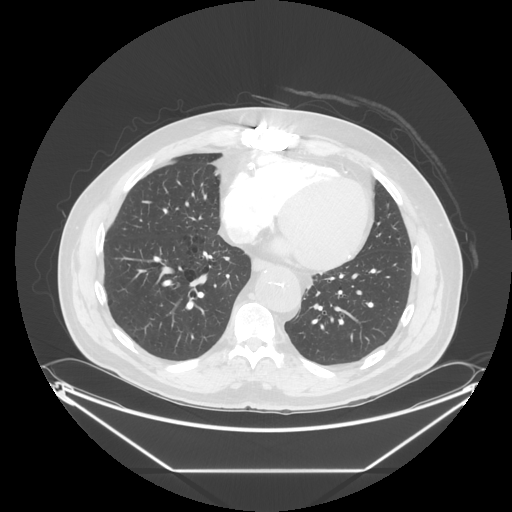

[Series 9: angio · axial · 0.90mm/px · z∈[-459,-99]mm · 3 of 288 slices shown (2 of 2)]
[im 72/288  lung]
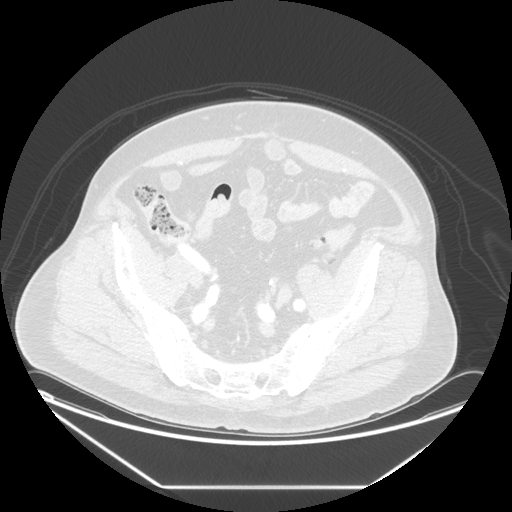
[im 144/288  mediastinal]
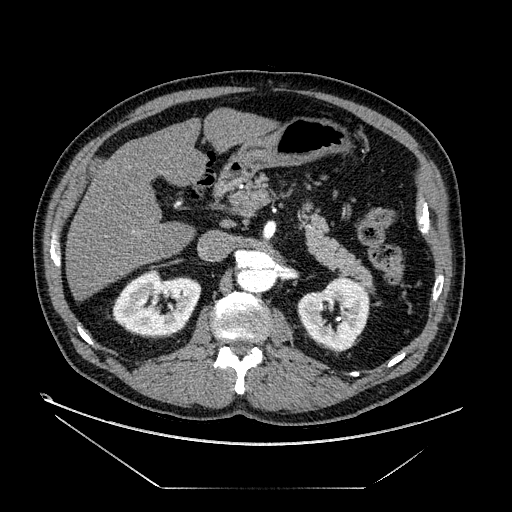
[im 216/288  lung]
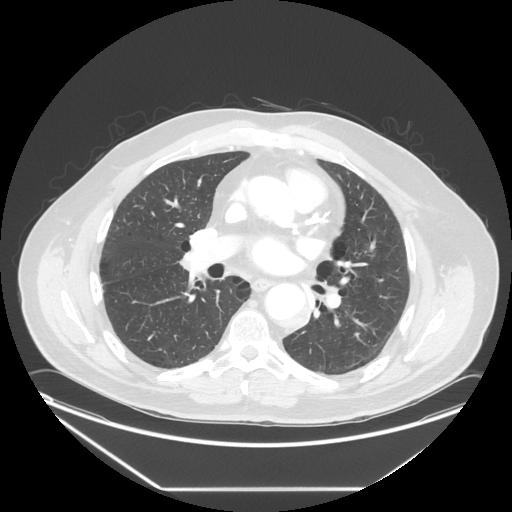

[5 of 37 positions shown; findings below may reference images not displayed]

FINDINGS: CTA CHEST FINDINGS

Cardiovascular:

Heart:

Heart size unchanged. No pericardial fluid/thickening. Surgical
changes of median sternotomy. Dense calcifications of the left main,
left anterior descending, circumflex, right coronary arteries.
Minimal calcifications of native aortic valve.

Aorta:

Re- demonstration of surgical repair of type a dissection. Similar
appearance of the proximal and distal surgical anastomosis of the
tubular aorta. Similar appearance of decompression of the false
lumen at the aortic arch with patency of the branch vessels
maintained. Mild atherosclerotic changes of the proximal branch
vessels. Diameter of the aortic arch measures 4.0 cm, unchanged.
Similar appearance of descending thoracic aorta with the true lumen
anterior within the distal descending thoracic aorta and partial
thrombosis of the false lumen. The partially perfused false lumen
contributes to segmental vessels of the thoracic region. Greatest
diameter of the descending thoracic aorta measured at the aortic
hiatus approximately 4.1 cm. This is slightly smaller than the
comparison CT with a measurement of 4.3 cm. No periaortic fluid or
inflammatory changes.

Pulmonary arteries:

No filling defects within the main pulmonary artery, lobar arteries
or proximal segmental vessels. Beyond this pulmonary arteries are
limited for evaluation.

Mediastinum/Nodes: Small lymph nodes of the mediastinum.
Unremarkable appearance of the thoracic inlet.

Lungs/Pleura: Paraseptal and centrilobular emphysema with mild
architectural distortion and subpleural reticulation. No pleural
effusion or pneumothorax. No confluent airspace disease.

Musculoskeletal: Surgical changes of median sternotomy. No acute
displaced fracture. Degenerative changes of the thoracic spine.

Review of the MIP images confirms the above findings.

CTA ABDOMEN AND PELVIS FINDINGS

VASCULAR

Aorta: Upper abdominal aorta again demonstrates the distal aspect of
the dissection flap, which again involves the left renal artery and
terminates in the infrarenal abdominal aorta. Left renal artery
remains perfused.

Re- demonstration of surgical repair of the abdominal aorta with the
primary anastomosis unchanged in appearance. Bilateral iliac limbs
are patent to the common iliac arteries.

Greatest diameter of the native abdominal aorta again measured
within the suprarenal aorta, approximately 4.0 cm.

Celiac: Celiac artery remains patent, perfused from the true lumen.

SMA: Superior mesenteric artery is patent, perfused from true lumen.

Renals: Right renal artery perfused from the true lumen without
significant stenosis. Mild atherosclerotic changes at the origin.
Left renal artery is perfused, with the dissection flap involving
the origin. Symmetric perfusion of the left kidney to the right.

IMA: IMA has been excluded status post surgery.

Right lower extremity:

Right iliac limb patent. Common iliac artery and hypogastric artery
patent. External iliac artery patent. Mild atherosclerotic changes
at the common femoral artery. Proximal SFA and profunda femoris are
patent.

Left lower extremity:

Left iliac limb patent. Common iliac artery and hypogastric artery
patent. External iliac artery and common femoral artery with mild
atherosclerotic changes. Proximal SFA and profunda femoris patent

Veins: Unremarkable appearance of the venous system.

Review of the MIP images confirms the above findings.

NON-VASCULAR

Hepatobiliary: Diffusely decreased attenuation of liver parenchyma.
Hyperdense material within the gallbladder, compatible with
cholelithiasis/ sludge. No inflammatory changes.

Pancreas: Unremarkable pancreas

Spleen: Unremarkable spleen

Adrenals/Urinary Tract: Unremarkable bilateral adrenal glands

Right:

No hydronephrosis. Symmetric perfusion to the left. No
nephrolithiasis. Unremarkable course of the right ureter.

Left:

No hydronephrosis. Symmetric perfusion to the right. No
nephrolithiasis. Unremarkable course of the left ureter.

Unremarkable appearance of the urinary bladder.

Stomach/Bowel: Unremarkable appearance of stomach. Unremarkable
small bowel. Normal appendix. Colonic diverticulae a without
associated inflammatory changes.

Lymphatic: No abdominal adenopathy

Reproductive: Prostate calcifications

Other: Surgical changes along the midline abdomen. No ventral wall
hernia. Bilateral fat containing inguinal hernia.

Musculoskeletal: No acute displaced fracture. Degenerative changes
of the lumbar spine. No significant bony canal narrowing.
IMPRESSION: Unchanged appearance of treated type A dissection, with re-
demonstration of partially thrombosed false lumen of the descending
component status-post hemi arch and aortic valve resuspension, with
no evidence of post dissection aneurysm.

Unchanged appearance of surgically repaired infrarenal abdominal
aortic aneurysm.

Advanced left main and 3 vessel coronary artery disease.

Re- demonstration of emphysema.  Emphysema ([id number]).

Diverticular disease without evidence of acute diverticulitis.

Cholelithiasis.

Hepatic steatosis.

## 2018-03-24 MED FILL — OMEGA-3 ETHYL ESTER 1 GM CA: 1 | 90 days supply | Qty: 180 | Fill #1

## 2018-04-04 MED FILL — ALPRAZolam 1 MG TABS: 1 | 30 days supply | Qty: 60 | Fill #2 | Status: TO

## 2018-04-06 DIAGNOSIS — E041 Nontoxic single thyroid nodule: Secondary | ICD-10-CM | POA: Diagnosis not present

## 2018-04-19 ENCOUNTER — Other Ambulatory Visit: Payer: Self-pay

## 2018-04-19 DIAGNOSIS — I714 Abdominal aortic aneurysm, without rupture, unspecified: Secondary | ICD-10-CM

## 2018-04-25 ENCOUNTER — Other Ambulatory Visit: Payer: Self-pay

## 2018-05-15 ENCOUNTER — Other Ambulatory Visit: Payer: Self-pay | Admitting: Cardiovascular Disease

## 2018-05-18 ENCOUNTER — Other Ambulatory Visit: Payer: Self-pay

## 2018-05-18 MED ORDER — METFORMIN HCL ER 500 MG PO TB24: 500.0000 mg | ORAL_TABLET | Freq: Every day | ORAL | 3 refills | Status: DC

## 2018-05-24 ENCOUNTER — Encounter: Payer: 59 | Admitting: Cardiothoracic Surgery

## 2018-05-24 NOTE — Progress Notes (Signed)
Patient ID: [name], male   DOB: Sep 06, 1950, 67 y.o.   MRN: [id number]   67 y.o.  status post repair of type A ascending dissection 02/19/12 PVT  with Heaney shield graft used to replace ascending aorta and hemi-arch reconstruction, resuspension of aortic valve Had a residual 6cm infrarenal AAA Since surgery has  paresthesias in 4th and 5th digits on right hand Likely brachial plexus injury with axillary artery cannulation on right side.Still with tingling in fingers and bicipital weakness   Had staged  AAA repair by Dr Kellie Simmering 10/2012  F/U venous duplex of LLE today showed resolution of thrombus and no DVT on left  Coumadin stopped   Echo reviewed 01/27/17 EF normal mild AS mean gradient 11 mmHg mild AR and stable aortic root  Only complaint is fatigue   ROS: Denies fever, malais, weight loss, blurry vision, decreased visual acuity, cough, sputum, SOB, hemoptysis, pleuritic pain, palpitaitons, heartburn, abdominal pain, melena, lower extremity edema, claudication, or rash.  All other systems reviewed and negative  General: BP 128/70   Pulse (!) 54   Ht 6\' 4"  (1.93 m)   Wt 234 lb (106.1 kg)   BMI 28.48 kg/m  Affect appropriate Healthy:  appears stated age 42: normal Neck supple with no adenopathy JVP normal no bruits no thyromegaly Lungs clear with no wheezing and good diaphragmatic motion Heart:  S1/S2 AS/AR  murmur, no rub, gallop or click PMI normal post sternotomy  Abdomen: benighn, BS positve, no tenderness, post AAA repair  no bruit.  No HSM or HJR Distal pulses intact with no bruits No edema Neuro non-focal Skin warm and dry No muscular weakness    Current Outpatient Medications  Medication Sig Dispense Refill  . ALPRAZolam (XANAX) 1 MG tablet TAKE 1/2 TABLET BY MOUTH 4 TIMES A DAY 60 tablet 3  . aspirin EC 81 MG tablet Take 81 mg by mouth daily with lunch.    [name] azelastine (ASTELIN) 0.1 % nasal spray PLACE 2 SPRAYS INTO BOTH NOSTRILS 2 TIMES DAILY. 30 mL 2   . bisacodyl (DULCOLAX) 10 MG suppository Place 1 suppository (10 mg total) rectally daily as needed for moderate constipation. 12 suppository 0  . calcium carbonate (TUMS EX) 750 MG chewable tablet Chew 1 tablet by mouth daily as needed for heartburn.     . cholecalciferol (VITAMIN D) 1000 UNITS tablet Take 2,000 Units by mouth daily.    [name] EPINEPHRINE 0.3 mg/0.3 mL IJ SOAJ injection INJECT 0.3 MLS INTO THE MUSCLE ONCE. 2 Device 3  . fexofenadine (ALLEGRA) 180 MG tablet Take 90 mg by mouth 2 (two) times daily.     . fluticasone (FLONASE) 50 MCG/ACT nasal spray Place 2 sprays into both nostrils daily. 16 g 5  . glucosamine-chondroitin 500-400 MG tablet Take 1 tablet by mouth 3 (three) times daily.    . metFORMIN (GLUCOPHAGE-XR) 500 MG 24 hr tablet Take 1 tablet (500 mg total) by mouth daily with breakfast. 90 tablet 3  . metoprolol tartrate (LOPRESSOR) 25 MG tablet TAKE 1 TABLET BY MOUTH 3 TIMES DAILY 270 tablet 0  . montelukast (SINGULAIR) 10 MG tablet TAKE 1/2 TABLET BY MOUTH 2 TIMES DAILY. 90 tablet 0  . Multiple Vitamin (MULTIVITAMIN WITH MINERALS) TABS Take 1 tablet by mouth daily.    [name] omega-3 acid ethyl esters (LOVAZA) 1 g capsule Take 1 capsule (1 g total) by mouth 2 (two) times daily. 180 capsule 3  . OVER THE COUNTER MEDICATION Apply 1 application topically daily as  needed (for acne). Oxy 10 vanishing cream    . polyethylene glycol powder (GLYCOLAX/MIRALAX) powder MIX 17G WITH LIQUID AND DRINK BY MOUTH DAILY. 527 g 5  . PREVIDENT 5000 DRY MOUTH 1.1 % GEL dental gel APPLY THIN RIBBON AND BRUSH FOR 2 MINUTES DAILY IN PLACE OF CONVENTIONAL TOOTHPASTE  99  . rosuvastatin (CRESTOR) 10 MG tablet TAKE 1 TABLET BY MOUTH ONCE DAILY 30 tablet 5  . thiamine (VITAMIN B-1) 100 MG tablet Take 100 mg by mouth 2 (two) times daily at 10 AM and 5 PM.     . TURMERIC CURCUMIN PO Take by mouth.    . vitamin B-12 (CYANOCOBALAMIN) 500 MCG tablet Take 500-1,000 mcg by mouth daily.      No current  facility-administered medications for this visit.     Allergies  Testosterone; Codeine; Gabapentin; Ultram [tramadol]; and Albuterol  Electrocardiogram:  05/25/18 SR rate 54 nonspecific ST changes   Assessment and Plan Dissection:  Grafted aortic root  Echo 01/27/17 with stable mild AS mean gradient 11 mmHg Peak 21 mmHg no AR stable root size  AAA:  F/U CT per Dr Kellie Simmering for false lumen Type A Dissection: post repair re suspended AV mean gradient 11 mmHg mild AR by echo 01/27/17 repeat echo in a year  Chest Pain Resolved normal myovue 2015  Resolved will observe  DM Discussed low carb diet.  Target hemoglobin A1c is 6.5 or less.  Continue current medications. Chol:    Cholesterol is at goal.  Continue current dose of statin and diet Rx.  No myalgias or side effects.  F/U  LFT's in 6 months. Lab Results  Component Value Date   LDLCALC 68 03/14/2018   DVT:   Resolved by Duplex off anticoagulaiton related to vascular surgery  Bruit:  01/09/16 duplex reviewed plaque no stenosis f/u ordered  Cognitive:  Some issues with memory ? Migrainous aura f/u neuro More anxiety no social supports    [employer]

## 2018-05-25 ENCOUNTER — Ambulatory Visit: Payer: 59 | Admitting: Cardiovascular Disease

## 2018-05-25 VITALS — BP 128/70 | HR 54 | Ht 76.0 in | Wt 234.0 lb

## 2018-05-25 DIAGNOSIS — I350 Nonrheumatic aortic (valve) stenosis: Secondary | ICD-10-CM | POA: Diagnosis not present

## 2018-05-25 NOTE — Patient Instructions (Addendum)
Medication Instructions:   If you need a refill on your cardiac medications before your next appointment, please call your pharmacy.   Lab work:  If you have labs (blood work) drawn today and your tests are completely normal, you will receive your results only by: [name] MyChart Message (if you have MyChart) OR . A paper copy in the mail If you have any lab test that is abnormal or we need to change your treatment, we will call you to review the results.  Testing/Procedures: NONE ordered at this time.  Follow-Up: At [facility], you and your health needs are our priority.  As part of our continuing mission to provide you with exceptional heart care, we have created designated Provider Care Teams.  These Care Teams include your primary Cardiologist (physician) and Advanced Practice Providers (APPs -  Physician Assistants and Nurse Practitioners) who all work together to provide you with the care you need, when you need it. You will need a follow up appointment in 6 months.  Please call our office 2 months in advance to schedule this appointment.  You may see Dr. Johnsie Cancel or one of the following Advanced Practice Providers on your designated Care Team:   Truitt Merle, NP Cecilie Kicks, NP . Kathyrn Drown, NP

## 2018-06-02 ENCOUNTER — Ambulatory Visit
Admission: RE | Admit: 2018-06-02 | Discharge: 2018-06-02 | Disposition: A | Discharge status: Home / Self Care | Payer: 59 | Source: Ambulatory Visit | Attending: Vascular Surgery | Admitting: Vascular Surgery

## 2018-06-02 DIAGNOSIS — I714 Abdominal aortic aneurysm, without rupture, unspecified: Secondary | ICD-10-CM

## 2018-06-02 MED ORDER — IOPAMIDOL (ISOVUE-370) INJECTION 76%
75.0000 mL | Freq: Once | INTRAVENOUS | Status: AC | PRN
  Administered 2018-06-02: 75 mL via INTRAVENOUS

## 2018-06-06 ENCOUNTER — Ambulatory Visit (INDEPENDENT_AMBULATORY_CARE_PROVIDER_SITE_OTHER): Payer: 59 | Admitting: Vascular Surgery

## 2018-06-06 ENCOUNTER — Encounter: Payer: Self-pay | Admitting: Vascular Surgery

## 2018-06-06 VITALS — BP 120/55 | HR 66 | Temp 97.1°F | Resp 12 | Ht 76.0 in | Wt 234.0 lb

## 2018-06-06 DIAGNOSIS — I714 Abdominal aortic aneurysm, without rupture, unspecified: Secondary | ICD-10-CM

## 2018-06-06 NOTE — Progress Notes (Signed)
Vascular and Vein Specialist of [place]  Patient name: [name] MRN: [id number] DOB: 25-Apr-1951 Sex: male  REASON FOR VISIT: Discuss recent CT follow-up of chronic descending thoracic dissection  HPI: [name] is a 67 y.o. male here today for follow-up.  Reports that he is retiring within the next year.  Notices some cognitive changes.  No symptoms referable to his descending thoracic dissection and no evidence of malperfusion.  Past Medical History:  Diagnosis Date  . AAA (abdominal aortic aneurysm) ([place])   . ALLERGIC RHINITIS   . Anemia    post op, treated /w Fe  . Anxiety   . Arthritis    OA, hands, - thumbs- specifically  . Brachial plexopathy    double crush injury with median nerve damage on the right dominant hand.   [name] COPD (chronic obstructive pulmonary disease) ([place])    also states he has been told that he has slight emphysema   . Dissecting aortic aneurysm, thoracic ([place]) 02/20/2012   s/p emergent surgical repair  . DM2 (diabetes mellitus, type 2) ([place])   . DVT (deep venous thrombosis) ([facility])    coumadin- x3 months , post op  . ERECTILE DYSFUNCTION, ORGANIC   . GERD (gastroesophageal reflux disease)    related to diet & anxiety   . HYPERLIPIDEMIA   . Hypertension    pt. followwed by Dr. Johnsie Cancel  . MIGRAINE HEADACHE    induced from physical , emotional stress , but sometimes occur randomly, also experiences BPPV  . OBSTRUCTIVE SLEEP APNEA 02/2008 sleep study   moderate & central- not able to tolerate CPAP   . Thyroid nodule 04/21/2015   2.3 cm on right, incidental finding on 03/2015 CT chest - f/u US ordered  . Unspecified vitamin D deficiency     Family History  Problem Relation Age of Onset  . Diabetes Mother   . Stroke Mother   . Dementia Mother   . Deep vein thrombosis Father        PE  . Varicose Veins Father   . Heart disease Maternal Grandmother   . Heart disease Maternal Uncle   . Heart disease  Paternal Uncle   . Heart disease Cousin   . Lung cancer Cousin        2 cousins    SOCIAL HISTORY: Social History   Tobacco Use  . Smoking status: Former Smoker    Packs/day: 0.10    Types: Cigarettes    Last attempt to quit: 02/18/2012    Years since quitting: 6.3  . Smokeless tobacco: Never Used  . Tobacco comment: 10 cigs/day, quit 02/2012 after TAA emergent repair  Substance Use Topics  . Alcohol use: Yes    Alcohol/week: 0.0 standard drinks    Comment: shot of Tequila every day     Allergies  Allergen Reactions  . Testosterone Shortness Of Breath    Medication:Androgel Pt states trouble breathing at night  . Codeine Nausea And Vomiting  . Gabapentin     "drunk" feeling  . Ultram [Tramadol] Nausea And Vomiting  . Albuterol Other (See Comments)    Panic attack    Current Outpatient Medications  Medication Sig Dispense Refill  . ALPRAZolam (XANAX) 1 MG tablet TAKE 1/2 TABLET BY MOUTH 4 TIMES A DAY 60 tablet 3  . aspirin EC 81 MG tablet Take 81 mg by mouth daily with lunch.    [name] azelastine (ASTELIN) 0.1 % nasal spray PLACE 2 SPRAYS INTO BOTH NOSTRILS 2  TIMES DAILY. 30 mL 2  . bisacodyl (DULCOLAX) 10 MG suppository Place 1 suppository (10 mg total) rectally daily as needed for moderate constipation. 12 suppository 0  . calcium carbonate (TUMS EX) 750 MG chewable tablet Chew 1 tablet by mouth daily as needed for heartburn.     . cholecalciferol (VITAMIN D) 1000 UNITS tablet Take 2,000 Units by mouth daily.    [name] EPINEPHRINE 0.3 mg/0.3 mL IJ SOAJ injection INJECT 0.3 MLS INTO THE MUSCLE ONCE. 2 Device 3  . fexofenadine (ALLEGRA) 180 MG tablet Take 90 mg by mouth 2 (two) times daily.     . fluticasone (FLONASE) 50 MCG/ACT nasal spray Place 2 sprays into both nostrils daily. 16 g 5  . glucosamine-chondroitin 500-400 MG tablet Take 1 tablet by mouth 3 (three) times daily.    . metFORMIN (GLUCOPHAGE-XR) 500 MG 24 hr tablet Take 1 tablet (500 mg total) by mouth daily with  breakfast. 90 tablet 3  . metoprolol tartrate (LOPRESSOR) 25 MG tablet TAKE 1 TABLET BY MOUTH 3 TIMES DAILY 270 tablet 0  . montelukast (SINGULAIR) 10 MG tablet TAKE 1/2 TABLET BY MOUTH 2 TIMES DAILY. 90 tablet 0  . Multiple Vitamin (MULTIVITAMIN WITH MINERALS) TABS Take 1 tablet by mouth daily.    [name] omega-3 acid ethyl esters (LOVAZA) 1 g capsule Take 1 capsule (1 g total) by mouth 2 (two) times daily. 180 capsule 3  . OVER THE COUNTER MEDICATION Apply 1 application topically daily as needed (for acne). Oxy 10 vanishing cream    . polyethylene glycol powder (GLYCOLAX/MIRALAX) powder MIX 17G WITH LIQUID AND DRINK BY MOUTH DAILY. 527 g 5  . PREVIDENT 5000 DRY MOUTH 1.1 % GEL dental gel APPLY THIN RIBBON AND BRUSH FOR 2 MINUTES DAILY IN PLACE OF CONVENTIONAL TOOTHPASTE  99  . rosuvastatin (CRESTOR) 10 MG tablet TAKE 1 TABLET BY MOUTH ONCE DAILY 30 tablet 5  . thiamine (VITAMIN B-1) 100 MG tablet Take 100 mg by mouth 2 (two) times daily at 10 AM and 5 PM.     . TURMERIC CURCUMIN PO Take by mouth.    . vitamin B-12 (CYANOCOBALAMIN) 500 MCG tablet Take 500-1,000 mcg by mouth daily.      No current facility-administered medications for this visit.     REVIEW OF SYSTEMS:  [X]  denotes positive finding, [ ]  denotes negative finding Cardiac  Comments:  Chest pain or chest pressure:    Shortness of breath upon exertion: x   Short of breath when lying flat:    Irregular heart rhythm:        Vascular    Pain in calf, thigh, or hip brought on by ambulation: x   Pain in feet at night that wakes you up from your sleep:     Blood clot in your veins:    Leg swelling:           PHYSICAL EXAM: Vitals:   06/06/18 1038  BP: (!) 120/55  Pulse: 66  Resp: 12  Temp: (!) 97.1 F (36.2 C)  SpO2: 94%  Weight: 234 lb (106.1 kg)  Height: 6\' 4"  (1.93 m)    GENERAL: The patient is a well-nourished male, in no acute distress. The vital signs are documented above. CARDIOVASCULAR: 2+ radial pulses and 2+  dorsalis pedis pulses bilaterally PULMONARY: There is good air exchange  MUSCULOSKELETAL: There are no major deformities or cyanosis. NEUROLOGIC: No focal weakness or paresthesias are detected. SKIN: There are no ulcers or rashes noted. PSYCHIATRIC: The  patient has a normal affect.  DATA:  CT scan shows stable chronic aortic dissection.  His maximal diameter is 4.7 cm versus 1 year ago at 4.5 cm.  No change in perfusion of the true and false lumen.  Is status post aortic arch surgical repair with notation from the radiologist about possible small pseudoaneurysm  MEDICAL ISSUES: Stable CT follow-up of chronic distal thoracic aortic dissection.  No change in his prior open repair of his infrarenal abdominal aortic aneurysm with Dr. Kellie Simmering in 2014.  He does have follow-up with Dr.Van Trigt in several weeks for discussion of his prior a sending arch repair.  I will see him again in 1 year with repeat CT scan of his chest and abdomen    Rosetta Posner, MD [facility] Specialists [facility] Tel [phone] Pager [phone]

## 2018-06-21 ENCOUNTER — Emergency Department (HOSPITAL_COMMUNITY)
Admission: EM | Admit: 2018-06-21 | Discharge: 2018-06-21 | Disposition: A | Discharge status: Home / Self Care | Payer: 59 | Attending: Emergency Medicine | Admitting: Emergency Medicine

## 2018-06-21 ENCOUNTER — Other Ambulatory Visit: Payer: Self-pay | Admitting: Internal Medicine

## 2018-06-21 ENCOUNTER — Other Ambulatory Visit: Payer: Self-pay

## 2018-06-21 ENCOUNTER — Encounter: Payer: Self-pay | Admitting: Cardiothoracic Surgery

## 2018-06-21 ENCOUNTER — Ambulatory Visit: Payer: 59 | Admitting: Cardiothoracic Surgery

## 2018-06-21 ENCOUNTER — Emergency Department (HOSPITAL_COMMUNITY): Payer: 59

## 2018-06-21 ENCOUNTER — Encounter (HOSPITAL_COMMUNITY): Payer: Self-pay | Admitting: Emergency Medicine

## 2018-06-21 VITALS — BP 144/76 | HR 58 | Resp 18 | Ht 76.0 in | Wt 237.4 lb

## 2018-06-21 DIAGNOSIS — Z9889 Other specified postprocedural states: Secondary | ICD-10-CM

## 2018-06-21 DIAGNOSIS — Z87891 Personal history of nicotine dependence: Secondary | ICD-10-CM | POA: Diagnosis not present

## 2018-06-21 DIAGNOSIS — R51 Headache: Secondary | ICD-10-CM | POA: Diagnosis not present

## 2018-06-21 DIAGNOSIS — I714 Abdominal aortic aneurysm, without rupture: Secondary | ICD-10-CM | POA: Insufficient documentation

## 2018-06-21 DIAGNOSIS — I10 Essential (primary) hypertension: Secondary | ICD-10-CM | POA: Diagnosis not present

## 2018-06-21 DIAGNOSIS — E119 Type 2 diabetes mellitus without complications: Secondary | ICD-10-CM | POA: Diagnosis not present

## 2018-06-21 DIAGNOSIS — J449 Chronic obstructive pulmonary disease, unspecified: Secondary | ICD-10-CM | POA: Diagnosis not present

## 2018-06-21 DIAGNOSIS — G43109 Migraine with aura, not intractable, without status migrainosus: Secondary | ICD-10-CM | POA: Insufficient documentation

## 2018-06-21 LAB — DIFFERENTIAL
Abs Immature Granulocytes: 0.04 10*3/uL (ref 0.00–0.07)
Basophils Absolute: 0.1 10*3/uL (ref 0.0–0.1)
Basophils Relative: 1 %
EOS ABS: 0.1 10*3/uL (ref 0.0–0.5)
Eosinophils Relative: 2 %
Immature Granulocytes: 1 %
Lymphocytes Relative: 23 %
Lymphs Abs: 1.3 10*3/uL (ref 0.7–4.0)
Monocytes Absolute: 0.8 10*3/uL (ref 0.1–1.0)
Monocytes Relative: 13 %
Neutro Abs: 3.4 10*3/uL (ref 1.7–7.7)
Neutrophils Relative %: 60 %

## 2018-06-21 LAB — COMPREHENSIVE METABOLIC PANEL
ALT: 61 U/L — ABNORMAL HIGH (ref 0–44)
ANION GAP: 8 (ref 5–15)
AST: 47 U/L — ABNORMAL HIGH (ref 15–41)
Albumin: 3.8 g/dL (ref 3.5–5.0)
Alkaline Phosphatase: 28 U/L — ABNORMAL LOW (ref 38–126)
BUN: 21 mg/dL (ref 8–23)
CO2: 22 mmol/L (ref 22–32)
Calcium: 9 mg/dL (ref 8.9–10.3)
Chloride: 106 mmol/L (ref 98–111)
Creatinine, Ser: 0.99 mg/dL (ref 0.61–1.24)
GFR calc Af Amer: >60 mL/min (ref >60)
GFR calc non Af Amer: >60 mL/min (ref >60)
GLUCOSE: 275 mg/dL — AB (ref 70–99)
Potassium: 4.4 mmol/L (ref 3.5–5.1)
Sodium: 136 mmol/L (ref 135–145)
Total Bilirubin: 1.4 mg/dL — ABNORMAL HIGH (ref 0.3–1.2)
Total Protein: 6.6 g/dL (ref 6.5–8.1)

## 2018-06-21 LAB — I-STAT CHEM 8, ED
BUN: 22 mg/dL (ref 8–23)
Calcium, Ion: 1.15 mmol/L (ref 1.15–1.40)
Chloride: 103 mmol/L (ref 98–111)
Creatinine, Ser: 0.8 mg/dL (ref 0.61–1.24)
Glucose, Bld: 273 mg/dL — ABNORMAL HIGH (ref 70–99)
HCT: 47 % (ref 39.0–52.0)
Hemoglobin: 16 g/dL (ref 13.0–17.0)
POTASSIUM: 4.4 mmol/L (ref 3.5–5.1)
Sodium: 136 mmol/L (ref 135–145)
TCO2: 22 mmol/L (ref 22–32)

## 2018-06-21 LAB — PROTIME-INR
INR: 1
PROTHROMBIN TIME: 13.1 s (ref 11.4–15.2)

## 2018-06-21 LAB — CBC
HCT: 46.2 % (ref 39.0–52.0)
Hemoglobin: 15.3 g/dL (ref 13.0–17.0)
MCH: 31 pg (ref 26.0–34.0)
MCHC: 33.1 g/dL (ref 30.0–36.0)
MCV: 93.5 fL (ref 80.0–100.0)
PLATELETS: 141 10*3/uL — AB (ref 150–400)
RBC: 4.94 MIL/uL (ref 4.22–5.81)
RDW: 12.9 % (ref 11.5–15.5)
WBC: 5.7 10*3/uL (ref 4.0–10.5)
nRBC: 0 % (ref 0.0–0.2)

## 2018-06-21 LAB — I-STAT TROPONIN, ED: Troponin i, poc: 0.01 ng/mL (ref 0.00–0.08)

## 2018-06-21 LAB — APTT: aPTT: 27 seconds (ref 24–36)

## 2018-06-21 LAB — CBG MONITORING, ED: Glucose-Capillary: 248 mg/dL — ABNORMAL HIGH (ref 70–99)

## 2018-06-21 MED ORDER — LACTATED RINGERS IV BOLUS: 1000.0000 mL | Freq: Once | INTRAVENOUS | Status: DC

## 2018-06-21 MED ORDER — LOSARTAN POTASSIUM 50 MG PO TABS: 50.0000 mg | ORAL_TABLET | Freq: Every day | ORAL | 6 refills | Status: DC

## 2018-06-21 MED ORDER — PROCHLORPERAZINE EDISYLATE 10 MG/2ML IJ SOLN: 10.0000 mg | Freq: Once | INTRAMUSCULAR | Status: DC

## 2018-06-21 MED ORDER — DIPHENHYDRAMINE HCL 50 MG/ML IJ SOLN: 25.0000 mg | Freq: Once | INTRAMUSCULAR | Status: DC

## 2018-06-21 MED ORDER — DEXAMETHASONE SODIUM PHOSPHATE 10 MG/ML IJ SOLN: 10.0000 mg | Freq: Once | INTRAMUSCULAR | Status: DC

## 2018-06-21 NOTE — ED Notes (Signed)
Pt transported to CT ?

## 2018-06-21 NOTE — ED Triage Notes (Signed)
Pt from Dr. Carlye Grippe office with c/o of high bp readings of 180 sys. He reports having a occular migraine yesterday and having difficulty getting words out with gargled speech last night around 1930 lasting 30-45 mins.  Speaking clear full sentences. No drift, with equal grips. A/o at triage.

## 2018-06-21 NOTE — Progress Notes (Signed)
PCP is Hoyt Koch, MD Referring Provider is Rowe Clack, MD  Chief Complaint  Patient presents with  . Thoracic Aortic Dissection    yearly f/u, s/p repair 02/20/12  Patient examined, most recent CTA of chest and echocardiogram images personally reviewed and counseled with patient  HPI: Patient presents for one-year follow-up with CTA of thoracic aorta.  The patient had repair of a acute Stanford type a ascending aortic dissection with proximal arch replacement and aortic valve resuspension in 2013.  He subsequently has had a AAA repair.  The false lumen has resolved in the arch and descending thoracic aorta to the level of the carina but there is a remaining segment of false lumen from the lower thoracic aorta to the renal arteries.  Maximal diameter of the remaining false lumen and true lumen is 4.7 cm.  Patient was recently evaluated by his vascular surgeon Dr. Donnetta Hutching and felt to be doing well and no surgical intervention was recommended for the dissection extending into the upper abdomen  The patient was recently seen by his cardiologist Dr. Johnsie Cancel who felt that his cardiac status was stable.  Most recent echocardiogram late 2018 shows normal LV function with minimal aortic regurgitation and an intact aortic root  CTA performed late December 2019 shows the thoracic aortic repair to be intact.  However there is a 10 mm penetrating ulcer in the remnant of the ascending aorta above the right coronary artery and below the anastomosis for the Dacron graft.  This is small, asymptomatic, does not need surgery, and will be followed with serial CT scans.  Past Medical History:  Diagnosis Date  . AAA (abdominal aortic aneurysm) ([place])   . ALLERGIC RHINITIS   . Anemia    post op, treated /w Fe  . Anxiety   . Arthritis    OA, hands, - thumbs- specifically  . Brachial plexopathy    double crush injury with median nerve damage on the right dominant hand.   [name] COPD (chronic  obstructive pulmonary disease) ([place])    also states he has been told that he has slight emphysema   . Dissecting aortic aneurysm, thoracic ([place]) 02/20/2012   s/p emergent surgical repair  . DM2 (diabetes mellitus, type 2) ([place])   . DVT (deep venous thrombosis) ([facility])    coumadin- x3 months , post op  . ERECTILE DYSFUNCTION, ORGANIC   . GERD (gastroesophageal reflux disease)    related to diet & anxiety   . HYPERLIPIDEMIA   . Hypertension    pt. followwed by Dr. Johnsie Cancel  . MIGRAINE HEADACHE    induced from physical , emotional stress , but sometimes occur randomly, also experiences BPPV  . OBSTRUCTIVE SLEEP APNEA 02/2008 sleep study   moderate & central- not able to tolerate CPAP   . Thyroid nodule 04/21/2015   2.3 cm on right, incidental finding on 03/2015 CT chest - f/u US ordered  . Unspecified vitamin D deficiency     Past Surgical History:  Procedure Laterality Date  . ABDOMINAL AORTIC ANEURYSM REPAIR N/A 11/01/2012   Procedure: ANEURYSM ABDOMINAL AORTIC REPAIR;  Surgeon: Mal Misty, MD;  [facility];  Service: Vascular;  Laterality: N/A;  Resection and Grafting of Abdominal Aortic Aneurysm,(AORTA BI ILIAC)  . Nasal fx without repair    . THORACIC AORTIC ANEURYSM REPAIR  02/20/2012   Procedure: THORACIC ASCENDING ANEURYSM REPAIR (AAA);  Surgeon: Ivin Poot, MD;  Location: [place];  Service: Open Heart Surgery;  Laterality: N/A;  Family History  Problem Relation Age of Onset  . Diabetes Mother   . Stroke Mother   . Dementia Mother   . Deep vein thrombosis Father        PE  . Varicose Veins Father   . Heart disease Maternal Grandmother   . Heart disease Maternal Uncle   . Heart disease Paternal Uncle   . Heart disease Cousin   . Lung cancer Cousin        2 cousins    Social History Social History   Tobacco Use  . Smoking status: Former Smoker    Packs/day: 0.10    Types: Cigarettes    Last attempt to quit: 02/18/2012    Years since quitting: 6.3  .  Smokeless tobacco: Never Used  . Tobacco comment: 10 cigs/day, quit 02/2012 after TAA emergent repair  Substance Use Topics  . Alcohol use: Yes    Alcohol/week: 0.0 standard drinks    Comment: shot of Tequila every day   . Drug use: No    Current Outpatient Medications  Medication Sig Dispense Refill  . ALPRAZolam (XANAX) 1 MG tablet TAKE 1/2 TABLET BY MOUTH 4 TIMES A DAY 60 tablet 3  . aspirin EC 81 MG tablet Take 81 mg by mouth daily with lunch.    [name] azelastine (ASTELIN) 0.1 % nasal spray PLACE 2 SPRAYS INTO BOTH NOSTRILS 2 TIMES DAILY. 30 mL 2  . bisacodyl (DULCOLAX) 10 MG suppository Place 1 suppository (10 mg total) rectally daily as needed for moderate constipation. 12 suppository 0  . calcium carbonate (TUMS EX) 750 MG chewable tablet Chew 1 tablet by mouth daily as needed for heartburn.     . cholecalciferol (VITAMIN D) 1000 UNITS tablet Take 2,000 Units by mouth daily.    [name] EPINEPHRINE 0.3 mg/0.3 mL IJ SOAJ injection INJECT 0.3 MLS INTO THE MUSCLE ONCE. 2 Device 3  . fexofenadine (ALLEGRA) 180 MG tablet Take 90 mg by mouth 2 (two) times daily.     . fluticasone (FLONASE) 50 MCG/ACT nasal spray Place 2 sprays into both nostrils daily. 16 g 5  . glucosamine-chondroitin 500-400 MG tablet Take 1 tablet by mouth 3 (three) times daily.    . metFORMIN (GLUCOPHAGE-XR) 500 MG 24 hr tablet Take 1 tablet (500 mg total) by mouth daily with breakfast. 90 tablet 3  . metoprolol tartrate (LOPRESSOR) 25 MG tablet TAKE 1 TABLET BY MOUTH 3 TIMES DAILY 270 tablet 0  . montelukast (SINGULAIR) 10 MG tablet TAKE 1/2 TABLET BY MOUTH 2 TIMES DAILY. 90 tablet 0  . Multiple Vitamin (MULTIVITAMIN WITH MINERALS) TABS Take 1 tablet by mouth daily.    [name] omega-3 acid ethyl esters (LOVAZA) 1 g capsule Take 1 capsule (1 g total) by mouth 2 (two) times daily. 180 capsule 3  . OVER THE COUNTER MEDICATION Apply 1 application topically daily as needed (for acne). Oxy 10 vanishing cream    . polyethylene glycol powder  (GLYCOLAX/MIRALAX) powder MIX 17G WITH LIQUID AND DRINK BY MOUTH DAILY. 527 g 5  . PREVIDENT 5000 DRY MOUTH 1.1 % GEL dental gel APPLY THIN RIBBON AND BRUSH FOR 2 MINUTES DAILY IN PLACE OF CONVENTIONAL TOOTHPASTE  99  . rosuvastatin (CRESTOR) 10 MG tablet TAKE 1 TABLET BY MOUTH ONCE DAILY 30 tablet 5  . thiamine (VITAMIN B-1) 100 MG tablet Take 100 mg by mouth 2 (two) times daily at 10 AM and 5 PM.     . TURMERIC CURCUMIN PO Take by mouth.    [name]  vitamin B-12 (CYANOCOBALAMIN) 500 MCG tablet Take 500-1,000 mcg by mouth daily.     [name] losartan (COZAAR) 50 MG tablet Take 1 tablet (50 mg total) by mouth daily. 30 tablet 6   No current facility-administered medications for this visit.     Allergies  Allergen Reactions  . Testosterone Shortness Of Breath    Medication:Androgel Pt states trouble breathing at night  . Codeine Nausea And Vomiting  . Gabapentin     "drunk" feeling  . Ultram [Tramadol] Nausea And Vomiting  . Albuterol Other (See Comments)    Panic attack    Review of Systems   States that last night he has had severe headache, transient visual changes ? amaurosis and transient dysarthria.  He states he only slept 2 hours last night.  He thought that he had had a migraine headache.  However on examination in the office on multiple readings his blood pressure left arm [which reflects the blood pressure to his brain] is 180/120.  Patient does not check his blood pressure at home.  He is recommended that once his blood pressure get straightened out he will need to check it at least 3 days a week and record it and present the blood pressure data to his primary care physician as well as myself.  BP (!) 144/76 (BP Location: Left Arm, Patient Position: Sitting, Cuff Size: Large)   Pulse (!) 58   Resp 18   Ht 6\' 4"  (1.93 m)   Wt 237 lb 6.4 oz (107.7 kg)   SpO2 95% Comment: RA  BMI 28.90 kg/m  Physical Exam Blood pressure left arm 180/120 on 3 separate readings Heart rate 60 and  regular Patient appears fatigued Carotid pulses 2+ Breath sounds clear Heart rate regular with a soft 2/6 systolic murmur, no diastolic murmur or gallop Abdomen soft nontender without pulsatile mass Extremities warm with 2+ pulses in upper and lower extremities without significant edema Neuro without focal motor deficit Diagnostic Tests:   Impression: Status post repair of ascending aortic dissection 2013  New small 10 mm penetrating ulcer of the short segment of remaining ascending aorta above the right coronary ostia.  Type A dissection  has resolved except for small segment of false lumen in the distal descending thoracic aorta and the upper abdominal aorta.  Mild residual aortic insufficiency  Poorly controlled high blood pressure  Plan: Patient will be started on losartan 50 mg daily in addition to metoprolol for his hypertension and the newly formed penetrating ulcer.  He will also increase his Crestor 10 mg to be taken daily, currently he only takes it every other day because of some muscle discomfort.  The patient is at high risk for stroke with elevated blood pressure and transient neurologic symptoms as noted above and he was directed to be evaluated at urgent care to control his blood pressure as he will probably need IV medication.  Patient will be started on losartan 50 mg daily.  I will check him back in 3 months for blood pressure check and in 6 months repeat the CTA of his thoracic aorta.  Len Childs, MD [facility] [phone]

## 2018-06-21 NOTE — ED Provider Notes (Signed)
I personally evaluated the patient as the ED provider in triage.  Concern for high blood pressure and aphasia.  Patient exhibited no objective neurological deficits.  Patient has a history of complex migraines, explains that these migraines have caused transient aphasia in the past.  States that maybe his speech is a little bit off baseline currently, but there are no appreciable deficits to his speech.  Last known normal last night at [facility], code stroke initiation not indicated.  Blood pressure 146/79 with an acceptable limits.  Barth Kirks. [place]onia Small, [place] [email or web address]    Maudie Flakes, MD 06/21/18 1150

## 2018-06-21 NOTE — ED Provider Notes (Signed)
Emergency Department Provider Note   I have reviewed the triage vital signs and the nursing notes.   HISTORY  Chief Complaint Aphasia and Hypertension   HPI [name] is a 68 y.o. male with medical problems documented below the presents the emergency department today with word finding difficulties that seem to be resolving.  Patient states he has a history of ocular migraines that are usually associated with aphasia.  He had these last night.  The headache went away but he consistently has mild confusion and word finding difficulties intermittently.  Went to see his CT surgeon who said his blood pressure was too high and with neurologic symptoms need to come to the emergency room.  Patient has no other difficulty walking, change in vision, facial droop, slurred speech, extremity weakness or numbness or paresthesias.  No other associated or modifying symptoms.    Past Medical History:  Diagnosis Date  . AAA (abdominal aortic aneurysm) ([place])   . ALLERGIC RHINITIS   . Anemia    post op, treated /w Fe  . Anxiety   . Arthritis    OA, hands, - thumbs- specifically  . Brachial plexopathy    double crush injury with median nerve damage on the right dominant hand.   [name] COPD (chronic obstructive pulmonary disease) ([place])    also states he has been told that he has slight emphysema   . Dissecting aortic aneurysm, thoracic ([place]) 02/20/2012   s/p emergent surgical repair  . DM2 (diabetes mellitus, type 2) ([place])   . DVT (deep venous thrombosis) ([facility])    coumadin- x3 months , post op  . ERECTILE DYSFUNCTION, ORGANIC   . GERD (gastroesophageal reflux disease)    related to diet & anxiety   . HYPERLIPIDEMIA   . Hypertension    pt. followwed by Dr. Johnsie Cancel  . MIGRAINE HEADACHE    induced from physical , emotional stress , but sometimes occur randomly, also experiences BPPV  . OBSTRUCTIVE SLEEP APNEA 02/2008 sleep study   moderate & central- not able to tolerate CPAP   . Thyroid nodule  04/21/2015   2.3 cm on right, incidental finding on 03/2015 CT chest - f/u US ordered  . Unspecified vitamin D deficiency     Patient Active Problem List   Diagnosis Date Noted  . Coronary artery calcification seen on CAT scan 03/17/2018  . Cognitive decline 08/17/2017  . Ischemic brain damage 08/17/2017  . Chronic fatigue 08/17/2017  . Low back pain 08/24/2016  . Routine general medical examination at a health care facility 07/18/2015  . Thyroid nodule 04/21/2015  . AAA (abdominal aortic aneurysm) without rupture ([place]) 04/15/2015  . Transient amnesia 08/26/2014  . MCI (mild cognitive impairment) 08/26/2014  . Benign paroxysmal positional vertigo 08/26/2014  . S/P aortic dissection repair 04/02/2014  . Left carotid bruit 12/12/2013  . Peripheral neuropathy 03/21/2013  . DM2 (diabetes mellitus, type 2) ([place])   . Brachial plexopathy   . Syncope   . Anxiety   . Occlusion and stenosis of carotid artery without mention of cerebral infarction 04/11/2012  . AAA (abdominal aortic aneurysm) ([place]) 03/17/2012  . Injury of right brachial plexus 03/15/2012  . Vitamin D deficiency 12/17/2008  . Hyperlipidemia 12/17/2008  . OBSTRUCTIVE SLEEP APNEA 12/17/2008  . Migraine 12/17/2008  . ALLERGIC RHINITIS 12/17/2008  . Asthma 12/17/2008  . ERECTILE DYSFUNCTION, ORGANIC 12/17/2008    Past Surgical History:  Procedure Laterality Date  . ABDOMINAL AORTIC ANEURYSM REPAIR N/A 11/01/2012   Procedure:  ANEURYSM ABDOMINAL AORTIC REPAIR;  Surgeon: Mal Misty, MD;  Location: [facility];  Service: Vascular;  Laterality: N/A;  Resection and Grafting of Abdominal Aortic Aneurysm,(AORTA BI ILIAC)  . Nasal fx without repair    . THORACIC AORTIC ANEURYSM REPAIR  02/20/2012   Procedure: THORACIC ASCENDING ANEURYSM REPAIR (AAA);  Surgeon: Ivin Poot, MD;  Location: [place];  Service: Open Heart Surgery;  Laterality: N/A;    Current Outpatient Rx  . Order #: [id number] Class: Normal  . Order #: [id number] Class:  Historical Med  . Order #: [id number] Class: Normal  . Order #: [id number] Class: Normal  . Order #: [id number] Class: Historical Med  . Order #: [id number] Class: Historical Med  . Order #: [id number] Class: Normal  . Order #: [id number] Class: Historical Med  . Order #: [id number] Class: Normal  . Order #: [id number] Class: Historical Med  . Order #: [id number] Class: Normal  . Order #: [id number] Class: Normal  . Order #: [id number] Class: Normal  . Order #: [id number] Class: Normal  . Order #: [id number] Class: Historical Med  . Order #: [id number] Class: Normal  . Order #: [id number] Class: Historical Med  . Order #: [id number] Class: Normal  . Order #: [id number] Class: Historical Med  . Order #: [id number] Class: Normal  . Order #: [id number] Class: Historical Med  . Order #: [id number] Class: Historical Med  . Order #: [id number] Class: Historical Med    Allergies Testosterone; Codeine; Gabapentin; Ultram [tramadol]; and Albuterol  Family History  Problem Relation Age of Onset  . Diabetes Mother   . Stroke Mother   . Dementia Mother   . Deep vein thrombosis Father        PE  . Varicose Veins Father   . Heart disease Maternal Grandmother   . Heart disease Maternal Uncle   . Heart disease Paternal Uncle   . Heart disease Cousin   . Lung cancer Cousin        2 cousins    Social History Social History   Tobacco Use  . Smoking status: Former Smoker    Packs/day: 0.10    Types: Cigarettes    Last attempt to quit: 02/18/2012    Years since quitting: 6.3  . Smokeless tobacco: Never Used  . Tobacco comment: 10 cigs/day, quit 02/2012 after TAA emergent repair  Substance Use Topics  . Alcohol use: Yes    Alcohol/week: 0.0 standard drinks    Comment: shot of Tequila every day   . Drug use: No    Review of Systems  All other systems negative except as documented in the HPI. All pertinent positives and negatives as reviewed in the HPI. ____________________________________________   PHYSICAL EXAM:  VITAL  SIGNS: ED Triage Vitals  Enc Vitals Group     BP 06/21/18 1131 (!) 146/79     Pulse Rate 06/21/18 1131 (!) 58     Resp 06/21/18 1131 16     Temp 06/21/18 1131 97.6 F (36.4 C)     Temp Source 06/21/18 1131 Oral     SpO2 06/21/18 1131 96 %     Weight 06/21/18 1138 237 lb (107.5 kg)     Height 06/21/18 1138 6\' 4"  (1.93 m)    Constitutional: Alert and oriented. Well appearing and in no acute distress. Eyes: Conjunctivae are normal. PERRL. EOMI. Head: Atraumatic. Nose: No congestion/rhinnorhea. Mouth/Throat: Mucous membranes are moist.  Oropharynx non-erythematous. Neck: No stridor.  No meningeal signs.   Cardiovascular: Normal rate, regular rhythm. Good peripheral circulation. Grossly normal heart sounds.   Respiratory: Normal respiratory effort.  No retractions. Lungs  CTAB. Gastrointestinal: Soft and nontender. No distention.  Musculoskeletal: No lower extremity tenderness nor edema. No gross deformities of extremities. Neurologic:  No altered mental status, able to give full seemingly accurate history. Somewhat slow speech but no abnormalities.  Face is symmetric, EOM's intact, pupils equal and reactive, vision intact, tongue and uvula midline without deviation. Upper and Lower extremity motor 5/5, intact pain perception in distal extremities, 2+ reflexes in biceps, patella and achilles tendons. Able to perform finger to nose normal with both hands. Walks without assistance or evident ataxia.  Skin:  Skin is warm, dry and intact. No rash noted.  ____________________________________________   LABS (all labs ordered are listed, but only abnormal results are displayed)  Labs Reviewed  CBC - Abnormal; Notable for the following components:      Result Value   Platelets 141 (*)    All other components within normal limits  COMPREHENSIVE METABOLIC PANEL - Abnormal; Notable for the following components:   Glucose, Bld 275 (*)    AST 47 (*)    ALT 61 (*)    Alkaline Phosphatase 28  (*)    Total Bilirubin 1.4 (*)    All other components within normal limits  I-STAT CHEM 8, ED - Abnormal; Notable for the following components:   Glucose, Bld 273 (*)    All other components within normal limits  PROTIME-INR  APTT  DIFFERENTIAL  I-STAT TROPONIN, ED  CBG MONITORING, ED   ____________________________________________  EKG   EKG Interpretation  Date/Time:  Wednesday June 21 2018 11:39:22 EST Ventricular Rate:  58 PR Interval:  156 QRS Duration: 82 QT Interval:  406 QTC Calculation: 398 R Axis:   84 Text Interpretation:  Sinus bradycardia Otherwise normal ECG slower rate but  No significant change since last tracing Confirmed by Merrily Pew [phone]) on 06/21/2018 3:07:01 PM       ____________________________________________  RADIOLOGY  Ct Head Wo Contrast  Result Date: 06/21/2018 CLINICAL DATA:  Hypertension and headaches, initial encounter EXAM: CT HEAD WITHOUT CONTRAST TECHNIQUE: Contiguous axial images were obtained from the base of the skull through the vertex without intravenous contrast. COMPARISON:  04/13/2016 FINDINGS: Brain: Mild atrophic changes and chronic white matter ischemic changes are noted. No findings to suggest acute hemorrhage, acute infarction or space-occupying mass lesion are noted. Vascular: No hyperdense vessel or unexpected calcification. Skull: Normal. Negative for fracture or focal lesion. Sinuses/Orbits: No acute finding. Other: None. IMPRESSION: Chronic atrophic and ischemic changes without acute abnormality. Electronically Signed   By: Inez Catalina M.D.   On: 06/21/2018 13:51    ____________________________________________   PROCEDURES  Procedure(s) performed:   Procedures   ____________________________________________   INITIAL IMPRESSION / ASSESSMENT AND PLAN / ED COURSE  Patient with a negative work-up.  I discussed case with neurology who recommended doing a headache cocktail however patient states that his  symptoms are improving and his blood pressure is improving and he does not have any neurologic symptoms and he wants to go home.  I discussed with him how this could be a possible stroke that we might need to do an MRI of the headache cocktail did not work and he understood this but felt that he was back to his baseline and had been for little while now and wanted to be discharged.  Patient was discharged in stable condition to follow-up with his neurologist and primary doctor.   Pertinent labs & imaging results that were available during my care of the patient were reviewed by me  and considered in my medical decision making (see chart for details).  ____________________________________________  FINAL CLINICAL IMPRESSION(S) / ED DIAGNOSES  Final diagnoses:  Hypertension, unspecified type  Complicated migraine     MEDICATIONS GIVEN DURING THIS VISIT:  Medications - No data to display   NEW OUTPATIENT MEDICATIONS STARTED DURING THIS VISIT:  Discharge Medication List as of 06/21/2018  3:53 PM      Note:  This note was prepared with assistance of Dragon voice recognition software. Occasional wrong-word or sound-a-like substitutions may have occurred due to the inherent limitations of voice recognition software.   Merrily Pew, MD 06/21/18 [phone]

## 2018-06-22 NOTE — Telephone Encounter (Signed)
Called pharmacy last refill 05/15/2018 60tabs LOV: 03/14/2018 cpe NOV: 06/26/2018

## 2018-06-26 ENCOUNTER — Encounter: Payer: Self-pay | Admitting: Internal Medicine

## 2018-06-26 ENCOUNTER — Ambulatory Visit: Payer: 59 | Admitting: Internal Medicine

## 2018-06-26 ENCOUNTER — Other Ambulatory Visit (INDEPENDENT_AMBULATORY_CARE_PROVIDER_SITE_OTHER): Payer: 59

## 2018-06-26 VITALS — BP 118/64 | HR 59 | Temp 97.6°F | Ht 76.0 in | Wt 234.0 lb

## 2018-06-26 DIAGNOSIS — E119 Type 2 diabetes mellitus without complications: Secondary | ICD-10-CM

## 2018-06-26 DIAGNOSIS — Z9889 Other specified postprocedural states: Secondary | ICD-10-CM | POA: Diagnosis not present

## 2018-06-26 DIAGNOSIS — I714 Abdominal aortic aneurysm, without rupture, unspecified: Secondary | ICD-10-CM

## 2018-06-26 LAB — HEMOGLOBIN A1C: Hgb A1c MFr Bld: 8.5 % — ABNORMAL HIGH (ref 4.6–6.5)

## 2018-06-26 NOTE — Patient Instructions (Signed)
We will check the HgA1c today.   Get back to walking and monitor BP over the next couple of weeks for goal of 120/70.

## 2018-06-26 NOTE — Progress Notes (Signed)
   Subjective:   Patient ID: [name], male    DOB: 02-06-1951, 68 y.o.   MRN: [id number]  HPI The patient is a 68 YO man coming in for several concerns including blood pressure (stopped walking recently, BP is up in the 130-140s/80s recently, goal around 160 systolic given aneurysm s/p dissection and repair, denies headaches or chest pains since ER visit, no change in medications and not missing doses, started on losartan by CV surgery), and recent CT chest (with some new ulcer on the scan which CT surgery does not think needs intervention, also some calcified areas he wants to discuss, not current smoker), and diabetes (last HgA1c was elevated some, diet is stable, not exercising recently, weight is fairly stable, denies new numbness in feet although chronic after dissection repair).   Review of Systems  Constitutional: Negative.   HENT: Negative.   Eyes: Negative.   Respiratory: Negative for cough, chest tightness and shortness of breath.   Cardiovascular: Negative for chest pain, palpitations and leg swelling.  Gastrointestinal: Negative for abdominal distention, abdominal pain, constipation, diarrhea, nausea and vomiting.  Musculoskeletal: Negative.   Skin: Negative.   Neurological: Positive for numbness and headaches.  Psychiatric/Behavioral: Negative.     Objective:  Physical Exam Constitutional:      Appearance: He is well-developed.  HENT:     Head: Normocephalic and atraumatic.  Neck:     Musculoskeletal: Normal range of motion.  Cardiovascular:     Rate and Rhythm: Normal rate and regular rhythm.  Pulmonary:     Effort: Pulmonary effort is normal. No respiratory distress.     Breath sounds: Normal breath sounds. No wheezing or rales.  Abdominal:     General: Bowel sounds are normal. There is no distension.     Palpations: Abdomen is soft.     Tenderness: There is no abdominal tenderness. There is no rebound.  Skin:    General: Skin is warm and dry.    Neurological:     Mental Status: He is alert and oriented to person, place, and time.     Coordination: Coordination normal.     Vitals:   06/26/18 1359  BP: 118/64  Pulse: (!) 59  Temp: 97.6 F (36.4 C)  TempSrc: Oral  SpO2: 97%  Weight: 234 lb (106.1 kg)  Height: 6\' 4"  (1.93 m)    Assessment & Plan:

## 2018-06-27 NOTE — Assessment & Plan Note (Signed)
Small ulcer which does not need intervention per CT surgery and discussed recommendations with patient today.

## 2018-06-27 NOTE — Assessment & Plan Note (Signed)
Checking HgA1c and if still elevated will increase metformin to 1000 mg daily XR. On ARB and statin.

## 2018-06-27 NOTE — Assessment & Plan Note (Signed)
Goal BP 120/80 or lower. BP at goal today. Will continue losartan and metoprolol for control. No new side effects. Needs BMP in 3-4 weeks.

## 2018-06-28 ENCOUNTER — Encounter: Payer: Self-pay | Admitting: Internal Medicine

## 2018-07-04 MED ORDER — HYDROCHLOROTHIAZIDE 12.5 MG PO CAPS: 12.5000 mg | ORAL_CAPSULE | Freq: Every day | ORAL | 3 refills | Status: DC

## 2018-07-04 MED FILL — HYDROCHLOROTHIAZIDE 12.5 MG: 12.5 | 30 days supply | Qty: 30 | Fill #0

## 2018-07-10 ENCOUNTER — Telehealth: Payer: Self-pay

## 2018-07-10 ENCOUNTER — Ambulatory Visit: Payer: 59

## 2018-07-10 NOTE — Telephone Encounter (Signed)
I would recommend he take for full 2 weeks before we make any changes as long as he is not having side effects.

## 2018-07-10 NOTE — Telephone Encounter (Signed)
Routing to dr crawford, patient recently tried losartan for bp management, could not tolerate and started HCTZ on 06/26/18, sent in by dr crawford----his blood pressure is still hovering around 389'H and 734'K systolic when patient is at home---he came in today, resting bp is 138/78 today with me---patient would like to have bp a little lower, closer to 120's/80's and would like to see if you think its ok to add something in with HCTZ for this reason---if appropriate, can you send in to [facility] and can communicate with patient via mychart----his pharmacist suggested he try Valsartan if you think this could be good fit for him----please advise if I need to call him back----patient advised dr Sharlet Salina out of office until wed 1/29, thanks

## 2018-07-12 NOTE — Telephone Encounter (Addendum)
Patient advised of dr crawfords note/instructions, so far he has not had any side effects---he will continue HCTZ and keep bp journal for the next 2 weeks, pt advised to call us back if symptoms present or if he has any further questions--pt repeated back for understanding

## 2018-07-19 ENCOUNTER — Encounter: Payer: Self-pay | Admitting: Internal Medicine

## 2018-07-28 ENCOUNTER — Other Ambulatory Visit: Payer: Self-pay | Admitting: Internal Medicine

## 2018-07-28 MED ORDER — METFORMIN HCL ER 500 MG PO TB24: 1000.0000 mg | ORAL_TABLET | Freq: Every day | ORAL | 3 refills | Status: DC

## 2018-07-28 NOTE — Telephone Encounter (Signed)
Copied from [place] [id number]. Topic: Quick Communication - Rx Refill/Question >> Jul 28, 2018  8:51 AM Virl Axe D wrote: Medication: metFORMIN (GLUCOPHAGE-XR) 500 MG 24 hr tablet / Pharmacy stated pt informed them that he is now supposed to take this medication 2 times daily instead of 1. Requesting new rx so that insurance may cover rx. Please advise.  Has the patient contacted their pharmacy? Yes.   (Agent: If no, request that the patient contact the pharmacy for the refill.) (Agent: If yes, when and what did the pharmacy advise?)  Preferred Pharmacy (with phone number or street name): [facility] at [facility], [place] - [address] [phone] (Phone) [phone] (Fax)  Agent: Please be advised that RX refills may take up to 3 business days. We ask that you follow-up with your pharmacy.

## 2018-07-28 NOTE — Addendum Note (Signed)
Addended by: Pricilla Holm A on: 07/28/2018 10:39 AM   Modules accepted: Orders

## 2018-07-28 NOTE — Telephone Encounter (Signed)
Rx sent in

## 2018-07-28 NOTE — Telephone Encounter (Signed)
Per result note, pt was given verbal order to increase Metformin to twice daily. Routing back to office. Please review and update active medications regarding Metformin. See below: Notes recorded by Hoyt Koch, MD on 06/27/2018 at 5:30 PM EST The HgA1c is increased from last time to 8.5 so we should have you double the metformin and come back in about 3 months to recheck how that is working.

## 2018-08-03 ENCOUNTER — Encounter: Payer: Self-pay | Admitting: Internal Medicine

## 2018-08-21 ENCOUNTER — Other Ambulatory Visit: Payer: Self-pay | Admitting: Cardiovascular Disease

## 2018-09-01 ENCOUNTER — Other Ambulatory Visit: Payer: Self-pay | Admitting: Internal Medicine

## 2018-09-05 ENCOUNTER — Encounter: Payer: Self-pay | Admitting: Internal Medicine

## 2018-09-06 MED ORDER — HYDROCHLOROTHIAZIDE 25 MG PO TABS: 25.0000 mg | ORAL_TABLET | Freq: Every day | ORAL | 3 refills | Status: DC

## 2018-09-19 ENCOUNTER — Other Ambulatory Visit: Payer: Self-pay

## 2018-09-20 ENCOUNTER — Encounter: Payer: 59 | Admitting: Cardiothoracic Surgery

## 2018-09-21 ENCOUNTER — Encounter: Payer: Self-pay | Admitting: Cardiothoracic Surgery

## 2018-09-21 ENCOUNTER — Other Ambulatory Visit: Payer: Self-pay

## 2018-09-21 ENCOUNTER — Ambulatory Visit: Payer: 59 | Admitting: Cardiothoracic Surgery

## 2018-09-21 VITALS — BP 129/65 | HR 60 | Temp 97.4°F | Resp 20 | Ht 76.0 in | Wt 231.0 lb

## 2018-09-21 DIAGNOSIS — I10 Essential (primary) hypertension: Secondary | ICD-10-CM | POA: Diagnosis not present

## 2018-09-21 DIAGNOSIS — Z8679 Personal history of other diseases of the circulatory system: Secondary | ICD-10-CM

## 2018-09-21 NOTE — Progress Notes (Signed)
PCP is Hoyt Koch, MD Referring Provider is Rowe Clack, MD  Chief Complaint  Patient presents with  . Thoracic Aortic Dissection    BP Check    HPI: Patient returns for scheduled follow-up and blood pressure check.  Patient had an emergency type A aortic dissection repaired 6 years ago.  3 months ago he was evaluated after a surveillance CTA and was very hypertensive with dizziness.  His blood pressure medication has been titrated by his primary care physician Dr. Sharlet Salina and he takes his blood pressure daily.  Overall his blood pressure is well controlled high systolic pressure 409 range.  He is currently on metoprolol and HCTZ.  A trial of losartan was poorly tolerated and stopped.  He denies chest pain.  He is walking approximately 1 mile per day.  He watches his sodium intake very closely now.  Patient denies chest pain ankle edema, palpitations.  I reviewed his last CTA performed December 2019 which shows an intact ascending aortic repair with significant resolution of the distal descending aortic false lumen.  His last echocardiogram was 1.5 years ago showing normal LV function with mild AS and AI. Past Medical History:  Diagnosis Date  . AAA (abdominal aortic aneurysm) ([place])   . ALLERGIC RHINITIS   . Anemia    post op, treated /w Fe  . Anxiety   . Arthritis    OA, hands, - thumbs- specifically  . Brachial plexopathy    double crush injury with median nerve damage on the right dominant hand.   [name] COPD (chronic obstructive pulmonary disease) ([place])    also states he has been told that he has slight emphysema   . Dissecting aortic aneurysm, thoracic ([place]) 02/20/2012   s/p emergent surgical repair  . DM2 (diabetes mellitus, type 2) ([place])   . DVT (deep venous thrombosis) ([facility])    coumadin- x3 months , post op  . ERECTILE DYSFUNCTION, ORGANIC   . GERD (gastroesophageal reflux disease)    related to diet & anxiety   . HYPERLIPIDEMIA   . Hypertension    pt.  followwed by Dr. Johnsie Cancel  . MIGRAINE HEADACHE    induced from physical , emotional stress , but sometimes occur randomly, also experiences BPPV  . OBSTRUCTIVE SLEEP APNEA 02/2008 sleep study   moderate & central- not able to tolerate CPAP   . Thyroid nodule 04/21/2015   2.3 cm on right, incidental finding on 03/2015 CT chest - f/u US ordered  . Unspecified vitamin D deficiency     Past Surgical History:  Procedure Laterality Date  . ABDOMINAL AORTIC ANEURYSM REPAIR N/A 11/01/2012   Procedure: ANEURYSM ABDOMINAL AORTIC REPAIR;  Surgeon: Mal Misty, MD;  Location: [facility] OR;  Service: Vascular;  Laterality: N/A;  Resection and Grafting of Abdominal Aortic Aneurysm,(AORTA BI ILIAC)  . Nasal fx without repair    . THORACIC AORTIC ANEURYSM REPAIR  02/20/2012   Procedure: THORACIC ASCENDING ANEURYSM REPAIR (AAA);  Surgeon: Ivin Poot, MD;  Location: [facility];  Service: Open Heart Surgery;  Laterality: N/A;    Family History  Problem Relation Age of Onset  . Diabetes Mother   . Stroke Mother   . Dementia Mother   . Deep vein thrombosis Father        PE  . Varicose Veins Father   . Heart disease Maternal Grandmother   . Heart disease Maternal Uncle   . Heart disease Paternal Uncle   . Heart disease Cousin   .  Lung cancer Cousin        2 cousins    Social History Social History   Tobacco Use  . Smoking status: Former Smoker    Packs/day: 0.10    Types: Cigarettes    Last attempt to quit: 02/18/2012    Years since quitting: 6.5  . Smokeless tobacco: Never Used  . Tobacco comment: 10 cigs/day, quit 02/2012 after TAA emergent repair  Substance Use Topics  . Alcohol use: Yes    Alcohol/week: 0.0 standard drinks    Comment: shot of Tequila every day   . Drug use: No    Current Outpatient Medications  Medication Sig Dispense Refill  . ALPRAZolam (XANAX) 1 MG tablet TAKE 1/2 TABLET BY MOUTH 4 TIMES A DAY 60 tablet 3  . aspirin EC 81 MG tablet Take 81 mg by mouth daily with lunch.     [name] azelastine (ASTELIN) 0.1 % nasal spray PLACE 2 SPRAYS INTO EACH NOSTRILS TWICE A DAY 30 mL 1  . bisacodyl (DULCOLAX) 10 MG suppository Place 1 suppository (10 mg total) rectally daily as needed for moderate constipation. 12 suppository 0  . calcium carbonate (TUMS EX) 750 MG chewable tablet Chew 1 tablet by mouth daily as needed for heartburn.     . cholecalciferol (VITAMIN D) 1000 UNITS tablet Take 2,000 Units by mouth daily.    [name] EPINEPHRINE 0.3 mg/0.3 mL IJ SOAJ injection INJECT 0.3 MLS INTO THE MUSCLE ONCE. 2 Device 3  . fexofenadine (ALLEGRA) 180 MG tablet Take 90 mg by mouth 2 (two) times daily.     . fluticasone (FLONASE) 50 MCG/ACT nasal spray PLACE 2 SPRAYS INTO BOTH NOSTRILS DAILY. 16 g 2  . glucosamine-chondroitin 500-400 MG tablet Take 1 tablet by mouth 3 (three) times daily.    . hydrochlorothiazide (HYDRODIURIL) 25 MG tablet Take 1 tablet (25 mg total) by mouth daily. (Patient taking differently: Take 25 mg by mouth 2 (two) times daily. ) 90 tablet 3  . metFORMIN (GLUCOPHAGE-XR) 500 MG 24 hr tablet Take 2 tablets (1,000 mg total) by mouth daily with breakfast. 180 tablet 3  . metoprolol tartrate (LOPRESSOR) 25 MG tablet TAKE 1 TABLET BY MOUTH 3 TIMES DAILY 270 tablet 0  . montelukast (SINGULAIR) 10 MG tablet TAKE 1/2 TABLET BY MOUTH 2 TIMES DAILY. 90 tablet 3  . Multiple Vitamin (MULTIVITAMIN WITH MINERALS) TABS Take 1 tablet by mouth daily.    [name] omega-3 acid ethyl esters (LOVAZA) 1 g capsule Take 1 capsule (1 g total) by mouth 2 (two) times daily. 180 capsule 3  . OVER THE COUNTER MEDICATION Apply 1 application topically daily as needed (for acne). Oxy 10 vanishing cream    . polyethylene glycol powder (GLYCOLAX/MIRALAX) powder MIX 17G WITH LIQUID AND DRINK BY MOUTH DAILY. 527 g 5  . PREVIDENT 5000 DRY MOUTH 1.1 % GEL dental gel APPLY THIN RIBBON AND BRUSH FOR 2 MINUTES DAILY IN PLACE OF CONVENTIONAL TOOTHPASTE  99  . rosuvastatin (CRESTOR) 10 MG tablet TAKE 1 TABLET BY MOUTH ONCE  DAILY 30 tablet 5  . thiamine (VITAMIN B-1) 100 MG tablet Take 100 mg by mouth 2 (two) times daily at 10 AM and 5 PM.     . TURMERIC CURCUMIN PO Take by mouth.    . vitamin B-12 (CYANOCOBALAMIN) 500 MCG tablet Take 500-1,000 mcg by mouth daily.      No current facility-administered medications for this visit.     Allergies  Allergen Reactions  . Testosterone Shortness Of Breath  Medication:Androgel Pt states trouble breathing at night  . Codeine Nausea And Vomiting  . Gabapentin     "drunk" feeling  . Ultram [Tramadol] Nausea And Vomiting  . Albuterol Other (See Comments)    Panic attack    Review of Systems  Alert and comfortable No complaints  BP 129/65   Pulse 60   Temp (!) 97.4 F (36.3 C) (Oral)   Resp 20   Ht 6\' 4"  (1.93 m)   Wt 231 lb (104.8 kg)   SpO2 96% Comment: RA  BMI 28.12 kg/m  Physical Exam Lungs clear Heart rate regular Soft stable 2/6 systolic flow murmur Good peripheral pulses No ankle edema Abdomen soft without pulsatile mass or tenderness  Diagnostic Tests: None  Impression: I reviewed his home blood pressures and overall he is achieved adequate control with current blood pressure pressure medication.  Sharlet Salina will continue to monitor and treat his hypertension.  He will need a surveillance CTA performed December 2020.  This was arranged today.  Plan: Return later this year after CTA of thoracic aorta for surveillance scan following aortic surgery..  Continue close monitoring and treatment of blood pressure.  Continue with heart healthy lifestyle modifications.   Len Childs, MD [facility] [phone]

## 2018-09-26 MED FILL — HYDROCHLOROTHIAZIDE 25 MG T: 25 | 90 days supply | Qty: 90 | Fill #0 | Status: TO

## 2018-10-02 ENCOUNTER — Ambulatory Visit: Payer: 59 | Admitting: Cardiovascular Disease

## 2018-10-02 MED FILL — ALPRAZolam 1 MG TABS: 1 | 30 days supply | Qty: 60 | Fill #0

## 2018-10-23 ENCOUNTER — Other Ambulatory Visit: Payer: Self-pay | Admitting: Internal Medicine

## 2018-10-23 MED FILL — ROSUVASTATIN CALCIUM 10 MG: 10 | 90 days supply | Qty: 90 | Fill #0 | Status: TO

## 2018-10-23 MED FILL — metFORMIN HCL ER 500 MG TB2: 500 | 90 days supply | Qty: 180 | Fill #0 | Status: TO

## 2018-10-23 NOTE — Telephone Encounter (Signed)
Control database checked last refill: 10/02/2018 LOV: 06/16/2018, CPE 03/14/2018 NOV: 03/19/2019

## 2018-10-27 MED FILL — ALPRAZolam 1 MG TABS: 1 | 30 days supply | Qty: 60 | Fill #0 | Status: TO

## 2018-10-31 ENCOUNTER — Telehealth: Payer: Self-pay

## 2018-10-31 ENCOUNTER — Other Ambulatory Visit: Payer: Self-pay | Admitting: Internal Medicine

## 2018-10-31 NOTE — Telephone Encounter (Signed)
Unable to get in contact with the patient to convert their office visit with [facility] on 11/08/2018 into a doxy.me visit. I left a voicemail asking the patient to return my call. Office number was provided.   If patient calls back please convert their office visit into a doxy.me visit.

## 2018-11-08 ENCOUNTER — Ambulatory Visit: Payer: 59 | Admitting: Adult Health

## 2018-11-24 ENCOUNTER — Other Ambulatory Visit: Payer: Self-pay | Admitting: Cardiovascular Disease

## 2018-11-24 MED FILL — FLUTICASONE PROP 50 MCG SPR: 50 | 30 days supply | Qty: 16 | Fill #0

## 2018-11-24 MED FILL — METOPROLOL TARTRATE 25 MG T: 25 | 90 days supply | Qty: 270 | Fill #0

## 2018-12-07 MED FILL — HYDROCHLOROTHIAZIDE 25 MG T: 25 | 90 days supply | Qty: 90 | Fill #0

## 2018-12-11 ENCOUNTER — Other Ambulatory Visit: Payer: Self-pay | Admitting: Internal Medicine

## 2018-12-11 MED FILL — ALPRAZolam 1 MG TABS: 1 | 30 days supply | Qty: 60 | Fill #0

## 2018-12-11 MED FILL — EPINEPHRINE 0.3 MG AUTO-INJ: 0.3 | 30 days supply | Qty: 2 | Fill #0

## 2018-12-11 MED FILL — MONTELUKAST SOD 10 MG TAB: 10 | 90 days supply | Qty: 90 | Fill #0

## 2018-12-18 MED FILL — OMEGA-3 ETHYL ESTER 1 GM CA: 1 | 90 days supply | Qty: 180 | Fill #0

## 2019-01-12 DIAGNOSIS — H25813 Combined forms of age-related cataract, bilateral: Secondary | ICD-10-CM | POA: Diagnosis not present

## 2019-01-12 DIAGNOSIS — H401131 Primary open-angle glaucoma, bilateral, mild stage: Secondary | ICD-10-CM | POA: Diagnosis not present

## 2019-01-12 DIAGNOSIS — E119 Type 2 diabetes mellitus without complications: Secondary | ICD-10-CM | POA: Diagnosis not present

## 2019-01-12 DIAGNOSIS — H10413 Chronic giant papillary conjunctivitis, bilateral: Secondary | ICD-10-CM | POA: Diagnosis not present

## 2019-01-12 DIAGNOSIS — H04123 Dry eye syndrome of bilateral lacrimal glands: Secondary | ICD-10-CM | POA: Diagnosis not present

## 2019-01-12 LAB — HM DIABETES EYE EXAM

## 2019-01-15 ENCOUNTER — Encounter: Payer: Self-pay | Admitting: Internal Medicine

## 2019-01-15 NOTE — Progress Notes (Signed)
Abstracted and sent to scan  

## 2019-01-16 ENCOUNTER — Other Ambulatory Visit: Payer: Self-pay | Admitting: Internal Medicine

## 2019-01-16 NOTE — Telephone Encounter (Signed)
Control database checked last refill: 12/11/2018 60 tabs  LOV: 06/26/2018 NOV: 03/19/2019

## 2019-02-02 NOTE — Progress Notes (Signed)
Patient ID: [name], male   DOB: 01-21-1951, 68 y.o.   MRN: [id number]     68 y.o.  status post repair of type A ascending dissection 02/19/12 PVT  with Heaney shield graft used to replace ascending aorta and hemi-arch reconstruction, resuspension of aortic valve Had a residual 6cm infrarenal AAA Since surgery has  paresthesias in 4th and 5th digits on right hand Likely brachial plexus injury with axillary artery cannulation on right side.Still with tingling in fingers and bicipital weakness   Had staged  AAA repair by Dr [name] 10/2012  F/U venous duplex of LLE today showed resolution of thrombus and no DVT on left  Coumadin stopped   Echo reviewed 01/27/17 EF normal mild AS mean gradient 11 mmHg mild AR and stable aortic root  Seen by PVT April 2020 and plans for surveillance CTA in December Last CT 06/02/18  considered stable He also sees Dr [name] for his abdominal repair   1. Stable postsurgical changes of repair of complex aortic dissection with tube graft repair of the ascending thoracic aorta. There is a stable small contained pseudoaneurysm from the proximal anastomosis with mild external compression on the non coronary sinus of Valsalva. This finding remains unchanged across multiple prior studies dating back to at least October of 2017. 2. Stable chronic residual Stanford type B thoracic aortic dissection with mild aneurysmal change. The maximal diameter of the descending thoracic aorta measures 4.7 cm, slightly increased compared to 4.5 cm last year.  BP well controlled could not tolerate ARB in addition to current meds Still working at [employer]   ROS: Denies fever, malais, weight loss, blurry vision, decreased visual acuity, cough, sputum, SOB, hemoptysis, pleuritic pain, palpitaitons, heartburn, abdominal pain, melena, lower extremity edema, claudication, or rash.  All other systems reviewed and negative  General: BP 140/60   Pulse 67   Ht 6\' 4"  (1.93 m)   Wt 228 lb  6.4 oz (103.6 kg)   SpO2 97%   BMI 27.80 kg/m  Affect appropriate Healthy:  appears stated age 68: normal Neck supple with no adenopathy JVP normal no bruits no thyromegaly Lungs clear with no wheezing and good diaphragmatic motion Heart:  S1/S2 AS/AR  murmur, no rub, gallop or click PMI normal post sternotomy  Abdomen: benighn, BS positve, no tenderness, post AAA repair  no bruit.  No HSM or HJR Distal pulses intact with no bruits No edema Neuro non-focal Skin warm and dry No muscular weakness    Current Outpatient Medications  Medication Sig Dispense Refill  . ALPRAZolam (XANAX) 1 MG tablet TAKE 1/2 TABLET BY MOUTH 4 TIMES A DAY 60 tablet 5  . aspirin EC 81 MG tablet Take 81 mg by mouth daily with lunch.    [name] azelastine (ASTELIN) 0.1 % nasal spray PLACE 2 SPRAYS INTO EACH NOSTRILS TWICE A DAY 30 mL 1  . bisacodyl (DULCOLAX) 10 MG suppository Place 1 suppository (10 mg total) rectally daily as needed for moderate constipation. 12 suppository 0  . calcium carbonate (TUMS EX) 750 MG chewable tablet Chew 1 tablet by mouth daily as needed for heartburn.     . cholecalciferol (VITAMIN D) 1000 UNITS tablet Take 2,000 Units by mouth daily.    [name] EPINEPHRINE 0.3 mg/0.3 mL IJ SOAJ injection INJECT 0.3 MLS INTO THE MUSCLE ONCE AS DIRECTED 2 each 3  . fexofenadine (ALLEGRA) 180 MG tablet Take 90 mg by mouth 2 (two) times daily.     . fluticasone (FLONASE) 50 MCG/ACT nasal  spray PLACE 2 SPRAYS INTO BOTH NOSTRILS DAILY. 16 g 2  . glucosamine-chondroitin 500-400 MG tablet Take 1 tablet by mouth 3 (three) times daily.    . hydrochlorothiazide (HYDRODIURIL) 25 MG tablet Take 1 tablet (25 mg total) by mouth daily. (Patient taking differently: Take 25 mg by mouth 2 (two) times daily. ) 90 tablet 3  . metFORMIN (GLUCOPHAGE-XR) 500 MG 24 hr tablet Take 2 tablets (1,000 mg total) by mouth daily with breakfast. 180 tablet 3  . metoprolol tartrate (LOPRESSOR) 25 MG tablet Take 1 tablet (25 mg total)  by mouth 3 (three) times daily. 270 tablet 3  . montelukast (SINGULAIR) 10 MG tablet TAKE 1/2 TABLET BY MOUTH 2 TIMES DAILY. 90 tablet 3  . Multiple Vitamin (MULTIVITAMIN WITH MINERALS) TABS Take 1 tablet by mouth daily.    [name] omega-3 acid ethyl esters (LOVAZA) 1 g capsule TAKE 1 CAPSULE BY MOUTH 2 TIMES DAILY. 180 capsule 1  . OVER THE COUNTER MEDICATION Apply 1 application topically daily as needed (for acne). Oxy 10 vanishing cream    . polyethylene glycol powder (GLYCOLAX/MIRALAX) powder MIX 17G WITH LIQUID AND DRINK BY MOUTH DAILY. 527 g 5  . PREVIDENT 5000 DRY MOUTH 1.1 % GEL dental gel APPLY THIN RIBBON AND BRUSH FOR 2 MINUTES DAILY IN PLACE OF CONVENTIONAL TOOTHPASTE  99  . rosuvastatin (CRESTOR) 10 MG tablet TAKE 1 TABLET BY MOUTH ONCE DAILY 90 tablet 3  . thiamine (VITAMIN B-1) 100 MG tablet Take 100 mg by mouth 2 (two) times daily at 10 AM and 5 PM.     . TURMERIC CURCUMIN PO Take by mouth.    . vitamin B-12 (CYANOCOBALAMIN) 500 MCG tablet Take 500-1,000 mcg by mouth daily.      No current facility-administered medications for this visit.     Allergies  Testosterone, Codeine, Gabapentin, Ultram [tramadol], and Albuterol  Electrocardiogram:  05/25/18 SR rate 54 nonspecific ST changes   Assessment and Plan Dissection:  Grafted aortic root  Echo 01/27/17 with stable mild AS mean gradient 11 mmHg Peak 21 mmHg no AR stable root size  AAA:  F/U CT per Dr [name]  for false lumen Type A Dissection: post repair re suspended AV mean gradient 11 mmHg mild AR by echo 01/27/17 repeat echo ordered   Chest Pain Resolved normal myovue 2015  Resolved will observe  DM Discussed low carb diet.  Target hemoglobin A1c is 6.5 or less.  Continue current medications. Chol:    Cholesterol is at goal.  Continue current dose of statin and diet Rx.  No myalgias or side effects.  F/U  LFT's in 6 months. Lab Results  Component Value Date   LDLCALC 68 03/14/2018   DVT:   Resolved by Duplex off  anticoagulaiton related to vascular surgery  Bruit:  01/09/16 duplex reviewed plaque no stenosis f/u ordered  Cognitive:  Some issues with memory ? Migrainous aura f/u neuro More anxiety no social supports  Ordered echo and carotid for AS and burit  Dr Prescott Gum and Early will order CTA of chest and abdomen for December  Jenkins Rouge

## 2019-02-14 ENCOUNTER — Other Ambulatory Visit: Payer: Self-pay

## 2019-02-14 ENCOUNTER — Ambulatory Visit (HOSPITAL_COMMUNITY): Payer: 59 | Attending: Cardiovascular Disease

## 2019-02-14 ENCOUNTER — Encounter: Payer: Self-pay | Admitting: Cardiovascular Disease

## 2019-02-14 ENCOUNTER — Ambulatory Visit: Payer: 59 | Admitting: Cardiovascular Disease

## 2019-02-14 VITALS — BP 140/60 | HR 67 | Ht 76.0 in | Wt 228.4 lb

## 2019-02-14 DIAGNOSIS — R0989 Other specified symptoms and signs involving the circulatory and respiratory systems: Secondary | ICD-10-CM | POA: Diagnosis not present

## 2019-02-14 DIAGNOSIS — I7101 Dissection of thoracic aorta: Secondary | ICD-10-CM

## 2019-02-14 DIAGNOSIS — I71019 Dissection of thoracic aorta, unspecified: Secondary | ICD-10-CM

## 2019-02-14 DIAGNOSIS — I350 Nonrheumatic aortic (valve) stenosis: Secondary | ICD-10-CM

## 2019-02-14 LAB — ECHOCARDIOGRAM COMPLETE
Height: 76 in
Weight: 3654.4 oz

## 2019-02-14 MED ORDER — METOPROLOL TARTRATE 25 MG PO TABS: 25.0000 mg | ORAL_TABLET | Freq: Three times a day (TID) | ORAL | 3 refills | Status: DC

## 2019-02-14 NOTE — Patient Instructions (Addendum)
Your physician recommends that you continue on your current medications as directed. Please refer to the Current Medication list given to you today.  Your physician has requested that you have a carotid duplex. This test is an ultrasound of the carotid arteries in your neck. It looks at blood flow through these arteries that supply the brain with blood. Allow one hour for this exam. There are no restrictions or special instructions.  Your physician has requested that you have an echocardiogram. Echocardiography is a painless test that uses sound waves to create images of your heart. It provides your doctor with information about the size and shape of your heart and how well your heart's chambers and valves are working. This procedure takes approximately one hour. There are no restrictions for this procedure. ECHO TODAY ROOM 4 Your physician wants you to follow-up in: [place] will receive a reminder letter in the mail two months in advance. If you don't receive a letter, please call our office to schedule the follow-up appointment.

## 2019-03-06 ENCOUNTER — Ambulatory Visit: Payer: 59 | Admitting: Adult Health

## 2019-03-06 ENCOUNTER — Encounter: Payer: Self-pay | Admitting: Adult Health

## 2019-03-06 ENCOUNTER — Other Ambulatory Visit: Payer: Self-pay

## 2019-03-06 VITALS — BP 134/74 | HR 84 | Temp 98.1°F | Ht 76.0 in | Wt 229.4 lb

## 2019-03-06 DIAGNOSIS — G629 Polyneuropathy, unspecified: Secondary | ICD-10-CM

## 2019-03-06 DIAGNOSIS — R4189 Other symptoms and signs involving cognitive functions and awareness: Secondary | ICD-10-CM

## 2019-03-06 NOTE — Progress Notes (Signed)
PATIENT: [name] DOB: 06-27-1950  REASON FOR VISIT: follow up HISTORY FROM: patient  HISTORY OF PRESENT ILLNESS: Today 03/06/19:  [name] is a 68 year old male with a history of mild cognitive impairment as well as peripheral nerve injuries after her surgery.  He returns today for follow-up.  Overall he feels that he is remained relatively stable.  He does feel that his cognition has gotten slightly worse.  He notices it primarily with finding his words as well as decision making.  He is also noticed that sometimes he may not recognize people initially.  He states that he really has to make an effort to do things.  He lives at home alone.  He is able to complete all ADLs independently.  Denies any trouble with his finances.  He continues to have numbness in the feet and the right hand.  On occasion he will have discomfort.  He reports that this usually is exacerbated by increase in stress.  He returns today for an evaluation.   HISTORY  08-17-2017, last visit was with Edman Circle, NP.  and dedicated to [place].  [name] has followed here for peripheral nerve injuries as well as for a complex form of sleep apnea that was partially obstructive and partially central.   He is meanwhile 68 years of age and will turn 5 this November.  He lives alone, but has 2 daughters and now a grandchild on the way. He worries about dementia ever since heart surgery- He has difficulties with computer access. Password, user names, etc. 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 Forgot his daughter's birthday, misspelling, and he cannot recognize faces- but recalls names !  He recently mistook his cell phone for TV control. He feels fatigued, not refreshed or restored.  Bedtime 11- 11.30 PM, watches TV in den, no screen after 9.30, reads in a book, dim light, no late dinners, no alcohol, in a cool and quiet room.  Dr. Asa Lente prescribed Xanax and this has calmed him, helps with sleep. But  he is worried about his cognitive function.  CPAP failed him, chin strap very uncomfortable -he wants to learn about dental or ENT treatments.   REVIEW OF SYSTEMS: Out of a complete 14 system review of symptoms, the patient complains only of the following symptoms, and all other reviewed systems are negative.  See HPI  ALLERGIES: Allergies  Allergen Reactions  . Testosterone Shortness Of Breath    Medication:Androgel Pt states trouble breathing at night  . Codeine Nausea And Vomiting  . Gabapentin     "drunk" feeling  . Ultram [Tramadol] Nausea And Vomiting  . Albuterol Other (See Comments)    Panic attack    HOME MEDICATIONS: Outpatient Medications Prior to Visit  Medication Sig Dispense Refill  . ALPRAZolam (XANAX) 1 MG tablet TAKE 1/2 TABLET BY MOUTH 4 TIMES A DAY 60 tablet 5  . aspirin EC 81 MG tablet Take 81 mg by mouth daily with lunch.    [name] azelastine (ASTELIN) 0.1 % nasal spray PLACE 2 SPRAYS INTO EACH NOSTRILS TWICE A DAY 30 mL 1  . bisacodyl (DULCOLAX) 10 MG suppository Place 1 suppository (10 mg total) rectally daily as needed for moderate constipation. 12 suppository 0  . calcium carbonate (TUMS EX) 750 MG chewable tablet Chew 1 tablet by mouth daily as needed for heartburn.     . cholecalciferol (VITAMIN D) 1000 UNITS tablet Take 2,000 Units by mouth daily.    [name]  EPINEPHRINE 0.3 mg/0.3 mL IJ SOAJ injection INJECT 0.3 MLS INTO THE MUSCLE ONCE AS DIRECTED 2 each 3  . fexofenadine (ALLEGRA) 180 MG tablet Take 90 mg by mouth 2 (two) times daily.     . fluticasone (FLONASE) 50 MCG/ACT nasal spray PLACE 2 SPRAYS INTO BOTH NOSTRILS DAILY. 16 g 2  . hydrochlorothiazide (HYDRODIURIL) 25 MG tablet Take 1 tablet (25 mg total) by mouth daily. (Patient taking differently: Take 25 mg by mouth 2 (two) times daily. ) 90 tablet 3  . metFORMIN (GLUCOPHAGE-XR) 500 MG 24 hr tablet Take 2 tablets (1,000 mg total) by mouth daily with breakfast. 180 tablet 3  . metoprolol tartrate (LOPRESSOR)  25 MG tablet Take 1 tablet (25 mg total) by mouth 3 (three) times daily. 270 tablet 3  . montelukast (SINGULAIR) 10 MG tablet TAKE 1/2 TABLET BY MOUTH 2 TIMES DAILY. 90 tablet 3  . Multiple Vitamin (MULTIVITAMIN WITH MINERALS) TABS Take 1 tablet by mouth daily.    [name] omega-3 acid ethyl esters (LOVAZA) 1 g capsule TAKE 1 CAPSULE BY MOUTH 2 TIMES DAILY. 180 capsule 1  . OVER THE COUNTER MEDICATION Apply 1 application topically daily as needed (for acne). Oxy 10 vanishing cream    . polyethylene glycol powder (GLYCOLAX/MIRALAX) powder MIX 17G WITH LIQUID AND DRINK BY MOUTH DAILY. 527 g 5  . PREVIDENT 5000 DRY MOUTH 1.1 % GEL dental gel APPLY THIN RIBBON AND BRUSH FOR 2 MINUTES DAILY IN PLACE OF CONVENTIONAL TOOTHPASTE  99  . rosuvastatin (CRESTOR) 10 MG tablet TAKE 1 TABLET BY MOUTH ONCE DAILY 90 tablet 3  . thiamine (VITAMIN B-1) 100 MG tablet Take 100 mg by mouth 2 (two) times daily at 10 AM and 5 PM.     . TURMERIC CURCUMIN PO Take by mouth.    . vitamin B-12 (CYANOCOBALAMIN) 500 MCG tablet Take 500-1,000 mcg by mouth daily.     [name] glucosamine-chondroitin 500-400 MG tablet Take 1 tablet by mouth 3 (three) times daily.     No facility-administered medications prior to visit.     PAST MEDICAL HISTORY: Past Medical History:  Diagnosis Date  . AAA (abdominal aortic aneurysm) ([place])   . ALLERGIC RHINITIS   . Anemia    post op, treated /w Fe  . Anxiety   . Arthritis    OA, hands, - thumbs- specifically  . Brachial plexopathy    double crush injury with median nerve damage on the right dominant hand.   [name] COPD (chronic obstructive pulmonary disease) ([place])    also states he has been told that he has slight emphysema   . Dissecting aortic aneurysm, thoracic ([place]) 02/20/2012   s/p emergent surgical repair  . DM2 (diabetes mellitus, type 2) ([place])   . DVT (deep venous thrombosis) ([facility])    coumadin- x3 months , post op  . ERECTILE DYSFUNCTION, ORGANIC   . GERD (gastroesophageal reflux disease)     related to diet & anxiety   . HYPERLIPIDEMIA   . Hypertension    pt. followwed by Dr. Johnsie Cancel  . MIGRAINE HEADACHE    induced from physical , emotional stress , but sometimes occur randomly, also experiences BPPV  . OBSTRUCTIVE SLEEP APNEA 02/2008 sleep study   moderate & central- not able to tolerate CPAP   . Thyroid nodule 04/21/2015   2.3 cm on right, incidental finding on 03/2015 CT chest - f/u US ordered  . Unspecified vitamin D deficiency     PAST SURGICAL HISTORY: Past Surgical History:  Procedure Laterality Date  . ABDOMINAL AORTIC ANEURYSM REPAIR N/A 11/01/2012   Procedure: ANEURYSM ABDOMINAL AORTIC REPAIR;  Surgeon: Mal Misty, MD;  Location: [facility] OR;  Service: Vascular;  Laterality: N/A;  Resection and Grafting of Abdominal Aortic Aneurysm,(AORTA BI ILIAC)  . Nasal fx without repair    . THORACIC AORTIC ANEURYSM REPAIR  02/20/2012   Procedure: THORACIC ASCENDING ANEURYSM REPAIR (AAA);  Surgeon: Ivin Poot, MD;  Location: [facility];  Service: Open Heart Surgery;  Laterality: N/A;    FAMILY HISTORY: Family History  Problem Relation Age of Onset  . Diabetes Mother   . Stroke Mother   . Dementia Mother   . Deep vein thrombosis Father        PE  . Varicose Veins Father   . Heart disease Maternal Grandmother   . Heart disease Maternal Uncle   . Heart disease Paternal Uncle   . Heart disease Cousin   . Lung cancer Cousin        2 cousins    SOCIAL HISTORY: Social History   Socioeconomic History  . Marital status: Divorced    Spouse name: Not on file  . Number of children: 2  . Years of education: acad. deg.  . Highest education level: Not on file  Occupational History  . Occupation: internal med resident    Employer: [employer]  . Financial resource strain: Not on file  . Food insecurity    Worry: Not on file    Inability: Not on file  . Transportation needs    Medical: Not on file    Non-medical: Not on file  Tobacco Use  . Smoking status:  Former Smoker    Packs/day: 0.10    Types: Cigarettes    Quit date: 02/18/2012    Years since quitting: 7.0  . Smokeless tobacco: Never Used  . Tobacco comment: 10 cigs/day, quit 02/2012 after TAA emergent repair  Substance and Sexual Activity  . Alcohol use: Yes    Alcohol/week: 0.0 standard drinks    Comment: shot of Tequila every day   . Drug use: No  . Sexual activity: Not on file  Lifestyle  . Physical activity    Days per week: Not on file    Minutes per session: Not on file  . Stress: Not on file  Relationships  . Social Herbalist on phone: Not on file    Gets together: Not on file    Attends religious service: Not on file    Active member of club or organization: Not on file    Attends meetings of clubs or organizations: Not on file    Relationship status: Not on file  . Intimate partner violence    Fear of current or ex partner: Not on file    Emotionally abused: Not on file    Physically abused: Not on file    Forced sexual activity: Not on file  Other Topics Concern  . Not on file  Social History Narrative   Patient is single and lives at home alone.    Arboriculturist.   Patient works at [employer] full time.   Right handed.   Caffeine one cup of coffee daily.       PHYSICAL EXAM  Vitals:   03/06/19 1503  BP: 134/74  Pulse: 84  Temp: 98.1 F (36.7 C)  Weight: 229 lb 6.4 oz (104.1 kg)  Height: 6\' 4"  (1.93 m)   Body  mass index is 27.92 kg/m.  Generalized: Well developed, in no acute distress   Neurological examination  Mentation: Alert oriented to time, place, history taking. Follows all commands speech and language fluent Cranial nerve II-XII: Pupils were equal round reactive to light. Extraocular movements were full, visual field were full on confrontational test. Facial sensation and strength were normal. Uvula tongue midline. Head turning and shoulder shrug  were normal and symmetric. Motor: The motor testing reveals 5 over 5  strength of all 4 extremities. Good symmetric motor tone is noted throughout.  Sensory: Sensory testing is intact to soft touch on all 4 extremities. No evidence of extinction is noted.  Coordination: Cerebellar testing reveals good finger-nose-finger and heel-to-shin bilaterally.  Gait and station: Gait is normal.  Reflexes: Deep tendon reflexes are symmetric and normal bilaterally.   DIAGNOSTIC DATA (LABS, IMAGING, TESTING) - I reviewed patient records, labs, notes, testing and imaging myself where available.  Lab Results  Component Value Date   WBC 5.7 06/21/2018   HGB 16.0 06/21/2018   HCT 47.0 06/21/2018   MCV 93.5 06/21/2018   PLT 141 (L) 06/21/2018      Component Value Date/Time   NA 136 06/21/2018 1202   K 4.4 06/21/2018 1202   CL 103 06/21/2018 1202   CO2 22 06/21/2018 1143   GLUCOSE 273 (H) 06/21/2018 1202   BUN 22 06/21/2018 1202   CREATININE 0.80 06/21/2018 1202   CREATININE 0.98 04/03/2015 0957   CALCIUM 9.0 06/21/2018 1143   PROT 6.6 06/21/2018 1143   ALBUMIN 3.8 06/21/2018 1143   AST 47 (H) 06/21/2018 1143   ALT 61 (H) 06/21/2018 1143   ALKPHOS 28 (L) 06/21/2018 1143   BILITOT 1.4 (H) 06/21/2018 1143   GFRNONAA >60 06/21/2018 1143   GFRNONAA >89 06/09/2012 1515   GFRAA >60 06/21/2018 1143   GFRAA >89 06/09/2012 1515   Lab Results  Component Value Date   CHOL 120 03/14/2018   HDL 33.40 (L) 03/14/2018   LDLCALC 68 03/14/2018   LDLDIRECT 114 (H) 03/21/2010   TRIG 94.0 03/14/2018   CHOLHDL 4 03/14/2018   Lab Results  Component Value Date   HGBA1C 8.5 (H) 06/26/2018   Lab Results  Component Value Date   VITAMINB12 860 03/14/2018   Lab Results  Component Value Date   TSH 0.82 03/14/2018      ASSESSMENT AND PLAN 68 y.o. year old male  has a past medical history of AAA (abdominal aortic aneurysm) ([place]), ALLERGIC RHINITIS, Anemia, Anxiety, Arthritis, Brachial plexopathy, COPD (chronic obstructive pulmonary disease) ([place]), Dissecting aortic  aneurysm, thoracic ([place]) (02/20/2012), DM2 (diabetes mellitus, type 2) ([place]), DVT (deep venous thrombosis) ([place]), ERECTILE DYSFUNCTION, ORGANIC, GERD (gastroesophageal reflux disease), HYPERLIPIDEMIA, Hypertension, MIGRAINE HEADACHE, OBSTRUCTIVE SLEEP APNEA (02/2008 sleep study), Thyroid nodule (04/21/2015), and Unspecified vitamin D deficiency. here with:  1.  Mild cognitive impairment 2.  Neuropathy  In regards to the patient's memory his score has remained stable.  We discussed potentially repeating a neuropsychological evaluation.  His last one was in 2017.  He deferred for now.  The patient continues to have numbness in the lower extremities in the right hand as well as some discomfort on occasion.  He does not wish to be on any medication at this time.  He is advised that if his symptoms worsen or he develops new symptoms he should let us know.  He will follow-up in 6 months or sooner if needed.     Ward Givens, MSN, NP-C 03/06/2019, 3:06 PM  [facility] [address], [address] [place], [place] [address] [phone]   

## 2019-03-06 NOTE — Patient Instructions (Signed)
Your Plan:  Continue to monitor memory Can consider repeating neuropsychological testing If your symptoms worsen or you develop new symptoms please let us know.   Thank you for coming to see [place] at [facility]. I hope we have been able to provide you high quality care today.  You may receive a patient satisfaction survey over the next few weeks. We would appreciate your feedback and comments so that we may continue to improve ourselves and the health of our patients.

## 2019-03-09 ENCOUNTER — Other Ambulatory Visit: Payer: Self-pay | Admitting: Internal Medicine

## 2019-03-13 ENCOUNTER — Other Ambulatory Visit: Payer: Self-pay

## 2019-03-13 ENCOUNTER — Ambulatory Visit (HOSPITAL_COMMUNITY)
Admission: RE | Admit: 2019-03-13 | Discharge: 2019-03-13 | Disposition: A | Discharge status: Home / Self Care | Payer: 59 | Source: Ambulatory Visit | Attending: Cardiology | Admitting: Cardiology

## 2019-03-13 DIAGNOSIS — R0989 Other specified symptoms and signs involving the circulatory and respiratory systems: Secondary | ICD-10-CM | POA: Insufficient documentation

## 2019-03-15 ENCOUNTER — Telehealth: Payer: Self-pay

## 2019-03-15 DIAGNOSIS — R0989 Other specified symptoms and signs involving the circulatory and respiratory systems: Secondary | ICD-10-CM

## 2019-03-15 NOTE — Telephone Encounter (Addendum)
Patient needs carotid repeated in 2 years. Will place order.

## 2019-03-19 ENCOUNTER — Other Ambulatory Visit (INDEPENDENT_AMBULATORY_CARE_PROVIDER_SITE_OTHER): Payer: 59

## 2019-03-19 ENCOUNTER — Other Ambulatory Visit: Payer: Self-pay

## 2019-03-19 ENCOUNTER — Ambulatory Visit (INDEPENDENT_AMBULATORY_CARE_PROVIDER_SITE_OTHER): Payer: 59 | Admitting: Internal Medicine

## 2019-03-19 ENCOUNTER — Encounter: Payer: Self-pay | Admitting: Internal Medicine

## 2019-03-19 VITALS — BP 140/60 | HR 68 | Temp 97.9°F | Ht 76.0 in | Wt 230.0 lb

## 2019-03-19 DIAGNOSIS — Z23 Encounter for immunization: Secondary | ICD-10-CM

## 2019-03-19 DIAGNOSIS — R5382 Chronic fatigue, unspecified: Secondary | ICD-10-CM | POA: Diagnosis not present

## 2019-03-19 DIAGNOSIS — F419 Anxiety disorder, unspecified: Secondary | ICD-10-CM | POA: Diagnosis not present

## 2019-03-19 DIAGNOSIS — E1169 Type 2 diabetes mellitus with other specified complication: Secondary | ICD-10-CM | POA: Diagnosis not present

## 2019-03-19 DIAGNOSIS — Z0000 Encounter for general adult medical examination without abnormal findings: Secondary | ICD-10-CM

## 2019-03-19 DIAGNOSIS — E559 Vitamin D deficiency, unspecified: Secondary | ICD-10-CM

## 2019-03-19 DIAGNOSIS — E119 Type 2 diabetes mellitus without complications: Secondary | ICD-10-CM | POA: Diagnosis not present

## 2019-03-19 DIAGNOSIS — I714 Abdominal aortic aneurysm, without rupture, unspecified: Secondary | ICD-10-CM

## 2019-03-19 DIAGNOSIS — E785 Hyperlipidemia, unspecified: Secondary | ICD-10-CM

## 2019-03-19 LAB — LIPID PANEL
Cholesterol: 131 mg/dL (ref 0–200)
HDL: 36.3 mg/dL — ABNORMAL LOW (ref >39.00)
LDL Cholesterol: 61 mg/dL (ref 0–99)
NonHDL: 94.22
Total CHOL/HDL Ratio: 4
Triglycerides: 165 mg/dL — ABNORMAL HIGH (ref 0.0–149.0)
VLDL: 33 mg/dL (ref 0.0–40.0)

## 2019-03-19 LAB — CBC
HCT: 45.3 % (ref 39.0–52.0)
Hemoglobin: 15.2 g/dL (ref 13.0–17.0)
MCHC: 33.7 g/dL (ref 30.0–36.0)
MCV: 94.4 fl (ref 78.0–100.0)
Platelets: 157 10*3/uL (ref 150.0–400.0)
RBC: 4.8 Mil/uL (ref 4.22–5.81)
RDW: 13.1 % (ref 11.5–15.5)
WBC: 6.3 10*3/uL (ref 4.0–10.5)

## 2019-03-19 LAB — COMPREHENSIVE METABOLIC PANEL
ALT: 61 U/L — ABNORMAL HIGH (ref 0–53)
AST: 49 U/L — ABNORMAL HIGH (ref 0–37)
Albumin: 4.4 g/dL (ref 3.5–5.2)
Alkaline Phosphatase: 33 U/L — ABNORMAL LOW (ref 39–117)
BUN: 22 mg/dL (ref 6–23)
CO2: 29 mEq/L (ref 19–32)
Calcium: 9.6 mg/dL (ref 8.4–10.5)
Chloride: 99 mEq/L (ref 96–112)
Creatinine, Ser: 1.08 mg/dL (ref 0.40–1.50)
GFR: 68.44 mL/min (ref >60.00)
Glucose, Bld: 216 mg/dL — ABNORMAL HIGH (ref 70–99)
Potassium: 3.8 mEq/L (ref 3.5–5.1)
Sodium: 136 mEq/L (ref 135–145)
Total Bilirubin: 1.5 mg/dL — ABNORMAL HIGH (ref 0.2–1.2)
Total Protein: 7.2 g/dL (ref 6.0–8.3)

## 2019-03-19 LAB — MICROALBUMIN / CREATININE URINE RATIO
Creatinine,U: 124.5 mg/dL
Microalb Creat Ratio: 2.5 mg/g (ref 0.0–30.0)
Microalb, Ur: 3.2 mg/dL — ABNORMAL HIGH (ref 0.0–1.9)

## 2019-03-19 LAB — VITAMIN B12: Vitamin B-12: 1300 pg/mL — ABNORMAL HIGH (ref 211–911)

## 2019-03-19 LAB — TSH: TSH: 0.84 u[IU]/mL (ref 0.35–4.50)

## 2019-03-19 LAB — VITAMIN D 25 HYDROXY (VIT D DEFICIENCY, FRACTURES): VITD: 55.78 ng/mL (ref 30.00–100.00)

## 2019-03-19 LAB — HEMOGLOBIN A1C: Hgb A1c MFr Bld: 8.4 % — ABNORMAL HIGH (ref 4.6–6.5)

## 2019-03-19 LAB — T4, FREE: Free T4: 0.91 ng/dL (ref 0.60–1.60)

## 2019-03-19 NOTE — Progress Notes (Signed)
   Subjective:   Patient ID: [name], male    DOB: 07-Jun-1951, 68 y.o.   MRN: [id number]  HPI The patient is a 68 YO man coming in for physical. Thinking about retirement in the next few years and this is scary for him as he has limited friends and hobbies.   PMH, [facility], social history reviewed and updated  Review of Systems  Constitutional: Positive for activity change and fatigue.  HENT: Negative.   Eyes: Negative.   Respiratory: Negative for cough, chest tightness and shortness of breath.   Cardiovascular: Negative for chest pain, palpitations and leg swelling.  Gastrointestinal: Negative for abdominal distention, abdominal pain, constipation, diarrhea, nausea and vomiting.  Musculoskeletal: Negative.   Skin: Negative.   Neurological: Positive for numbness. Negative for dizziness, tremors, syncope, facial asymmetry, weakness and headaches.  Psychiatric/Behavioral: Positive for decreased concentration and dysphoric mood.    Objective:  Physical Exam Constitutional:      Appearance: He is well-developed. He is obese.  HENT:     Head: Normocephalic and atraumatic.  Neck:     Musculoskeletal: Normal range of motion.  Cardiovascular:     Rate and Rhythm: Normal rate and regular rhythm.  Pulmonary:     Effort: Pulmonary effort is normal. No respiratory distress.     Breath sounds: Normal breath sounds. No wheezing or rales.  Abdominal:     General: Bowel sounds are normal. There is no distension.     Palpations: Abdomen is soft.     Tenderness: There is no abdominal tenderness. There is no rebound.  Skin:    General: Skin is warm and dry.     Comments: Foot exam done.  Neurological:     Mental Status: He is alert and oriented to person, place, and time.     Coordination: Coordination normal.     Vitals:   03/19/19 1459  BP: 140/60  Pulse: 68  Temp: 97.9 F (36.6 C)  TempSrc: Oral  SpO2: 97%  Weight: 230 lb (104.3 kg)  Height: 6\' 4"  (1.93 m)     Assessment & Plan:  Pneumonia 23 given at visit

## 2019-03-19 NOTE — Patient Instructions (Signed)

## 2019-03-20 NOTE — Assessment & Plan Note (Signed)
Stable overall. Checking labs to rule out cause or worsening.

## 2019-03-20 NOTE — Assessment & Plan Note (Signed)
Increased metformin 1000 mg daily long acting for high HgA1c previously. Checking HgA1c and adjust as needed.

## 2019-03-20 NOTE — Assessment & Plan Note (Signed)
Checking lipid panel and adjust as needed his crestor as needed.

## 2019-03-20 NOTE — Assessment & Plan Note (Signed)
Flu shot at work. Pneumonia given 23 today. Shingrix counseled declines today. Tetanus up to date. Colonoscopy up to date. Counseled about sun safety and mole surveillance. Counseled about the dangers of distracted driving. Given 10 year screening recommendations.

## 2019-03-20 NOTE — Assessment & Plan Note (Signed)
Stable and coping well with pandemic. He is using xanax and reviewed [place] database and appropriate. Counseled about risk and benefit and he wishes to continue.

## 2019-03-20 NOTE — Assessment & Plan Note (Signed)
Stable, seeing vascular and imaging due in December.

## 2019-03-20 NOTE — Assessment & Plan Note (Signed)
Checking vitamin D and adjust as needed. 

## 2019-03-22 ENCOUNTER — Other Ambulatory Visit: Payer: Self-pay | Admitting: Internal Medicine

## 2019-03-22 MED ORDER — METFORMIN HCL ER 500 MG PO TB24: 1500.0000 mg | ORAL_TABLET | Freq: Every day | ORAL | 3 refills | Status: DC

## 2019-04-02 ENCOUNTER — Telehealth: Payer: Self-pay

## 2019-04-02 DIAGNOSIS — E119 Type 2 diabetes mellitus without complications: Secondary | ICD-10-CM

## 2019-04-02 NOTE — Telephone Encounter (Signed)
Copied from [place] [id number]. Topic: Appointment Scheduling - Scheduling Inquiry for Clinic >> Apr 02, 2019  3:35 PM Yvette Rack wrote: Reason for CRM: Pt stated that he needs an order for labs to check his A1C. Pt requests call back >> Apr 02, 2019  3:44 PM Para Skeans A wrote: Had it check on 03/19/2019.

## 2019-04-02 NOTE — Telephone Encounter (Signed)
Was patient to get this checked again so soon?

## 2019-04-02 NOTE — Telephone Encounter (Signed)
Orders placed but earliest this can be done is January.

## 2019-04-02 NOTE — Addendum Note (Signed)
Addended by: Pricilla Holm A on: 04/02/2019 04:24 PM   Modules accepted: Orders

## 2019-04-12 DIAGNOSIS — E041 Nontoxic single thyroid nodule: Secondary | ICD-10-CM | POA: Diagnosis not present

## 2019-04-12 DIAGNOSIS — E1142 Type 2 diabetes mellitus with diabetic polyneuropathy: Secondary | ICD-10-CM | POA: Diagnosis not present

## 2019-04-21 ENCOUNTER — Encounter: Payer: Self-pay | Admitting: Internal Medicine

## 2019-04-26 ENCOUNTER — Other Ambulatory Visit: Payer: Self-pay | Admitting: Cardiothoracic Surgery

## 2019-04-26 DIAGNOSIS — I712 Thoracic aortic aneurysm, without rupture, unspecified: Secondary | ICD-10-CM

## 2019-05-25 ENCOUNTER — Other Ambulatory Visit: Payer: Self-pay | Admitting: Vascular Surgery

## 2019-05-25 DIAGNOSIS — I714 Abdominal aortic aneurysm, without rupture, unspecified: Secondary | ICD-10-CM

## 2019-05-29 ENCOUNTER — Other Ambulatory Visit: Payer: Self-pay | Admitting: Internal Medicine

## 2019-05-29 ENCOUNTER — Encounter: Payer: Self-pay | Admitting: Internal Medicine

## 2019-06-12 ENCOUNTER — Other Ambulatory Visit: Payer: Self-pay | Admitting: Cardiothoracic Surgery

## 2019-06-12 DIAGNOSIS — E119 Type 2 diabetes mellitus without complications: Secondary | ICD-10-CM

## 2019-06-13 ENCOUNTER — Ambulatory Visit
Admission: RE | Admit: 2019-06-13 | Discharge: 2019-06-13 | Disposition: A | Discharge status: Home / Self Care | Payer: 59 | Source: Ambulatory Visit | Attending: Cardiothoracic Surgery | Admitting: Cardiothoracic Surgery

## 2019-06-13 ENCOUNTER — Encounter: Payer: Self-pay | Admitting: Cardiothoracic Surgery

## 2019-06-13 ENCOUNTER — Ambulatory Visit
Admission: RE | Admit: 2019-06-13 | Discharge: 2019-06-13 | Disposition: A | Discharge status: Home / Self Care | Payer: 59 | Source: Ambulatory Visit | Attending: Vascular Surgery | Admitting: Vascular Surgery

## 2019-06-13 ENCOUNTER — Ambulatory Visit: Payer: 59 | Admitting: Cardiothoracic Surgery

## 2019-06-13 ENCOUNTER — Other Ambulatory Visit: Payer: Self-pay

## 2019-06-13 VITALS — BP 135/68 | HR 57 | Temp 97.9°F | Resp 20 | Ht 76.0 in | Wt 226.0 lb

## 2019-06-13 DIAGNOSIS — I712 Thoracic aortic aneurysm, without rupture, unspecified: Secondary | ICD-10-CM

## 2019-06-13 DIAGNOSIS — I7100 Dissection of unspecified site of aorta: Secondary | ICD-10-CM | POA: Diagnosis not present

## 2019-06-13 DIAGNOSIS — I714 Abdominal aortic aneurysm, without rupture, unspecified: Secondary | ICD-10-CM

## 2019-06-13 DIAGNOSIS — I7101 Dissection of ascending aorta: Secondary | ICD-10-CM

## 2019-06-13 DIAGNOSIS — I7773 Dissection of renal artery: Secondary | ICD-10-CM | POA: Diagnosis not present

## 2019-06-13 MED ORDER — IOPAMIDOL (ISOVUE-370) INJECTION 76%
75.0000 mL | Freq: Once | INTRAVENOUS | Status: AC | PRN
  Administered 2019-06-13: 75 mL via INTRAVENOUS

## 2019-06-13 NOTE — Progress Notes (Signed)
PCP is Ryan Koch, MD Referring Provider is [name], *  Chief Complaint  Patient presents with  . Thoracic Aortic Aneurysm    CTA today    HPI: Patient presents for 1 year follow-up with CTA after emergency repair of a Stanford type a ascending thoracic aortic dissection and resuspension of the aortic valve 2013.  He has high blood pressure but his blood pressure has been well controlled on metoprolol and HCTZ with several daily blood pressure recordings provided showing systolic blood pressure [id number] mmHg or less for the past 6 weeks.  He denies chest pain.  He has generalized fatigue but no shortness of breath with exertion.  Earlier this year he had a carotid duplex which showed no significant stenosis. He also had a echocardiogram which showed normal LV systolic function, thickened sclerotic aortic valve without stenosis, trace central aortic insufficiency and intact aortic root.  CTA of the chest and abdomen today demonstrates an intact repair of his ascending aortic graft from the sinotubular junction to the proximal arch.  The descending thoracic aorta has a persistent small false lumen with total diameter of the descending thoracic aorta remains at 4.5 cm.  Basically has thoracic aortic repair and residual false lumen have remained stable for the past several years.  False lumen terminates at the level of the renal arteries both of which are well perfused. Past Medical History:  Diagnosis Date  . AAA (abdominal aortic aneurysm) ([place])   . ALLERGIC RHINITIS   . Anemia    post op, treated /w Fe  . Anxiety   . Arthritis    OA, hands, - thumbs- specifically  . Brachial plexopathy    double crush injury with median nerve damage on the right dominant hand.   [name] COPD (chronic obstructive pulmonary disease) ([place])    also states he has been told that he has slight emphysema   . Dissecting aortic aneurysm, thoracic ([place]) 02/20/2012   s/p emergent surgical repair  . DM2  (diabetes mellitus, type 2) ([place])   . DVT (deep venous thrombosis) ([facility])    coumadin- x3 months , post op  . ERECTILE DYSFUNCTION, ORGANIC   . GERD (gastroesophageal reflux disease)    related to diet & anxiety   . HYPERLIPIDEMIA   . Hypertension    pt. followwed by Dr. Johnsie Cancel  . MIGRAINE HEADACHE    induced from physical , emotional stress , but sometimes occur randomly, also experiences BPPV  . OBSTRUCTIVE SLEEP APNEA 02/2008 sleep study   moderate & central- not able to tolerate CPAP   . Thyroid nodule 04/21/2015   2.3 cm on right, incidental finding on 03/2015 CT chest - f/u US ordered  . Unspecified vitamin D deficiency     Past Surgical History:  Procedure Laterality Date  . ABDOMINAL AORTIC ANEURYSM REPAIR N/A 11/01/2012   Procedure: ANEURYSM ABDOMINAL AORTIC REPAIR;  Surgeon: Mal Misty, MD;  Location: [facility] OR;  Service: Vascular;  Laterality: N/A;  Resection and Grafting of Abdominal Aortic Aneurysm,(AORTA BI ILIAC)  . Nasal fx without repair    . THORACIC AORTIC ANEURYSM REPAIR  02/20/2012   Procedure: THORACIC ASCENDING ANEURYSM REPAIR (AAA);  Surgeon: Ivin Poot, MD;  Location: [facility];  Service: Open Heart Surgery;  Laterality: N/A;    Family History  Problem Relation Age of Onset  . Diabetes Mother   . Stroke Mother   . Dementia Mother   . Deep vein thrombosis Father  PE  . Varicose Veins Father   . Heart disease Maternal Grandmother   . Heart disease Maternal Uncle   . Heart disease Paternal Uncle   . Heart disease Cousin   . Lung cancer Cousin        2 cousins    Social History Social History   Tobacco Use  . Smoking status: Former Smoker    Packs/day: 0.10    Types: Cigarettes    Quit date: 02/18/2012    Years since quitting: 7.3  . Smokeless tobacco: Never Used  . Tobacco comment: 10 cigs/day, quit 02/2012 after TAA emergent repair  Substance Use Topics  . Alcohol use: Yes    Alcohol/week: 0.0 standard drinks    Comment: shot of Tequila  every day   . Drug use: No    Current Outpatient Medications  Medication Sig Dispense Refill  . ALPRAZolam (XANAX) 1 MG tablet TAKE 1/2 TABLET BY MOUTH 4 TIMES A DAY 60 tablet 5  . aspirin EC 81 MG tablet Take 81 mg by mouth daily with lunch.    [name] azelastine (ASTELIN) 0.1 % nasal spray PLACE 2 SPRAYS INTO EACH NOSTRILS TWICE A DAY 30 mL 1  . bisacodyl (DULCOLAX) 10 MG suppository Place 1 suppository (10 mg total) rectally daily as needed for moderate constipation. 12 suppository 0  . calcium carbonate (TUMS EX) 750 MG chewable tablet Chew 1 tablet by mouth daily as needed for heartburn.     . cholecalciferol (VITAMIN D) 1000 UNITS tablet Take 2,000 Units by mouth daily.    [name] EPINEPHRINE 0.3 mg/0.3 mL IJ SOAJ injection INJECT 0.3 MLS INTO THE MUSCLE ONCE AS DIRECTED 2 each 3  . fexofenadine (ALLEGRA) 180 MG tablet Take 90 mg by mouth 2 (two) times daily.     . fluticasone (FLONASE) 50 MCG/ACT nasal spray PLACE 2 SPRAYS INTO BOTH NOSTRILS DAILY. 16 g 1  . hydrochlorothiazide (HYDRODIURIL) 25 MG tablet Take 1 tablet (25 mg total) by mouth daily. (Patient taking differently: Take 25 mg by mouth 2 (two) times daily. ) 90 tablet 3  . metFORMIN (GLUCOPHAGE-XR) 500 MG 24 hr tablet Take 3 tablets (1,500 mg total) by mouth daily with breakfast. 270 tablet 3  . metoprolol tartrate (LOPRESSOR) 25 MG tablet Take 1 tablet (25 mg total) by mouth 3 (three) times daily. 270 tablet 3  . montelukast (SINGULAIR) 10 MG tablet TAKE 1/2 TABLET BY MOUTH 2 TIMES DAILY. 90 tablet 1  . Multiple Vitamin (MULTIVITAMIN WITH MINERALS) TABS Take 1 tablet by mouth daily.    [name] omega-3 acid ethyl esters (LOVAZA) 1 g capsule TAKE 1 CAPSULE BY MOUTH 2 TIMES DAILY. 180 capsule 1  . OVER THE COUNTER MEDICATION Apply 1 application topically daily as needed (for acne). Oxy 10 vanishing cream    . polyethylene glycol powder (GLYCOLAX/MIRALAX) powder MIX 17G WITH LIQUID AND DRINK BY MOUTH DAILY. 527 g 5  . PREVIDENT 5000 DRY MOUTH 1.1  % GEL dental gel APPLY THIN RIBBON AND BRUSH FOR 2 MINUTES DAILY IN PLACE OF CONVENTIONAL TOOTHPASTE  99  . rosuvastatin (CRESTOR) 10 MG tablet TAKE 1 TABLET BY MOUTH ONCE DAILY 90 tablet 3  . thiamine (VITAMIN B-1) 100 MG tablet Take 100 mg by mouth 2 (two) times daily at 10 AM and 5 PM.     . TURMERIC CURCUMIN PO Take by mouth.    . vitamin B-12 (CYANOCOBALAMIN) 500 MCG tablet Take 500-1,000 mcg by mouth daily.      No current  facility-administered medications for this visit.    Allergies  Allergen Reactions  . Testosterone Shortness Of Breath    Medication:Androgel Pt states trouble breathing at night  . Codeine Nausea And Vomiting  . Gabapentin     "drunk" feeling  . Ultram [Tramadol] Nausea And Vomiting  . Albuterol Other (See Comments)    Panic attack    Review of Systems   Patient  has migraine headache every 6 months Weight is stable He is not smoking No active dental complaints or difficulty swallowing No edema No abdominal pain No syncope No chest pain  BP 135/68 (BP Location: Left Arm, Patient Position: Sitting, Cuff Size: Normal)   Pulse (!) 57   Temp 97.9 F (36.6 C) (Skin)   Resp 20   Ht 6\' 4"  (1.93 m)   Wt 226 lb (102.5 kg)   SpO2 97% Comment: RA  BMI 27.51 kg/m  Physical Exam        Exam    General- alert and comfortable    Neck- no JVD, no cervical adenopathy palpable, no carotid bruit   Lungs- clear without rales, wheezes   Cor- regular rate and rhythm, 2/6 systolic murmur of aortic sclerosis, no gallop   Abdomen- soft, non-tender   Extremities - warm, non-tender, minimal edema   Neuro- oriented, appropriate, no focal weakness    Diagnostic Tests: CTA images personally reviewed and demonstrated to patient showing intact ascending aortic repair and stable mild persistent false lumen without significant enlargement of the descending thoracic aorta  Impression: Doing well after aortic dissection repair 7 years ago. His blood pressure now  is extremely well controlled He understands the important points of a heart healthy lifestyle including diet and regular aerobic exercise/walking.  We discussed the importance of antibiotic prophylaxis prior to any dental work which he has been compliant with. Plan: Continue to follow [name] with annual CTA, repeat scheduled in 1 year.  Len Childs, MD [facility] [phone]

## 2019-06-19 ENCOUNTER — Other Ambulatory Visit (INDEPENDENT_AMBULATORY_CARE_PROVIDER_SITE_OTHER): Payer: 59

## 2019-06-19 ENCOUNTER — Ambulatory Visit: Payer: 59 | Admitting: Vascular Surgery

## 2019-06-19 ENCOUNTER — Encounter: Payer: Self-pay | Admitting: Vascular Surgery

## 2019-06-19 ENCOUNTER — Other Ambulatory Visit: Payer: Self-pay

## 2019-06-19 VITALS — BP 131/70 | HR 68 | Resp 20 | Ht 76.0 in | Wt 228.2 lb

## 2019-06-19 DIAGNOSIS — I714 Abdominal aortic aneurysm, without rupture, unspecified: Secondary | ICD-10-CM

## 2019-06-19 DIAGNOSIS — E119 Type 2 diabetes mellitus without complications: Secondary | ICD-10-CM

## 2019-06-19 DIAGNOSIS — I7101 Dissection of thoracic aorta: Secondary | ICD-10-CM | POA: Diagnosis not present

## 2019-06-19 DIAGNOSIS — I71019 Dissection of thoracic aorta, unspecified: Secondary | ICD-10-CM

## 2019-06-19 LAB — HEMOGLOBIN A1C: Hgb A1c MFr Bld: 7.7 % — ABNORMAL HIGH (ref 4.6–6.5)

## 2019-06-19 NOTE — Progress Notes (Signed)
[facility] [place]  Patient name: [name] MRN: [id number] DOB: 11-29-1950 Sex: male  REASON FOR VISIT: Follow-up chronic aortic dissection  HPI: RAKIEM FACTOR is a 69 y.o. male here today for follow-up.  Prior history of open infrarenal abdominal aortic aneurysm repair with Dr. Kellie Simmering 2014.  Subsequently had a sending arch dissection and underwent aortic valve suspension and replacement by Dr. Darcey Nora.  Instantly diagnosed with descending thoracic dissection and is here for a follow-up CT after 1 year.  He reports that he is about to retire from [employer].  No symptoms referable to aneurysm dissection  Past Medical History:  Diagnosis Date  . AAA (abdominal aortic aneurysm) ([place])   . ALLERGIC RHINITIS   . Anemia    post op, treated /w Fe  . Anxiety   . Arthritis    OA, hands, - thumbs- specifically  . Brachial plexopathy    double crush injury with median nerve damage on the right dominant hand.   [name] COPD (chronic obstructive pulmonary disease) ([place])    also states he has been told that he has slight emphysema   . Dissecting aortic aneurysm, thoracic ([place]) 02/20/2012   s/p emergent surgical repair  . DM2 (diabetes mellitus, type 2) ([place])   . DVT (deep venous thrombosis) ([facility])    coumadin- x3 months , post op  . ERECTILE DYSFUNCTION, ORGANIC   . GERD (gastroesophageal reflux disease)    related to diet & anxiety   . HYPERLIPIDEMIA   . Hypertension    pt. followwed by Dr. Johnsie Cancel  . MIGRAINE HEADACHE    induced from physical , emotional stress , but sometimes occur randomly, also experiences BPPV  . OBSTRUCTIVE SLEEP APNEA 02/2008 sleep study   moderate & central- not able to tolerate CPAP   . Thyroid nodule 04/21/2015   2.3 cm on right, incidental finding on 03/2015 CT chest - f/u US ordered  . Unspecified vitamin D deficiency     Family History  Problem Relation Age of Onset  . Diabetes Mother   .  Stroke Mother   . Dementia Mother   . Deep vein thrombosis Father        PE  . Varicose Veins Father   . Heart disease Maternal Grandmother   . Heart disease Maternal Uncle   . Heart disease Paternal Uncle   . Heart disease Cousin   . Lung cancer Cousin        2 cousins    SOCIAL HISTORY: Social History   Tobacco Use  . Smoking status: Former Smoker    Packs/day: 0.10    Types: Cigarettes    Quit date: 02/18/2012    Years since quitting: 7.3  . Smokeless tobacco: Never Used  . Tobacco comment: 10 cigs/day, quit 02/2012 after TAA emergent repair  Substance Use Topics  . Alcohol use: Yes    Alcohol/week: 0.0 standard drinks    Comment: shot of Tequila every day     Allergies  Allergen Reactions  . Testosterone Shortness Of Breath    Medication:Androgel Pt states trouble breathing at night  . Codeine Nausea And Vomiting  . Gabapentin     "drunk" feeling  . Ultram [Tramadol] Nausea And Vomiting  . Albuterol Other (See Comments)    Panic attack    Current Outpatient Medications  Medication Sig Dispense Refill  . ALPRAZolam (XANAX) 1 MG tablet TAKE 1/2 TABLET BY MOUTH 4 TIMES A DAY 60 tablet 5  .  aspirin EC 81 MG tablet Take 81 mg by mouth daily with lunch.    [name] azelastine (ASTELIN) 0.1 % nasal spray PLACE 2 SPRAYS INTO EACH NOSTRILS TWICE A DAY 30 mL 1  . bisacodyl (DULCOLAX) 10 MG suppository Place 1 suppository (10 mg total) rectally daily as needed for moderate constipation. 12 suppository 0  . calcium carbonate (TUMS EX) 750 MG chewable tablet Chew 1 tablet by mouth daily as needed for heartburn.     . cholecalciferol (VITAMIN D) 1000 UNITS tablet Take 2,000 Units by mouth daily.    [name] EPINEPHRINE 0.3 mg/0.3 mL IJ SOAJ injection INJECT 0.3 MLS INTO THE MUSCLE ONCE AS DIRECTED 2 each 3  . fexofenadine (ALLEGRA) 180 MG tablet Take 90 mg by mouth 2 (two) times daily.     . fluticasone (FLONASE) 50 MCG/ACT nasal spray PLACE 2 SPRAYS INTO BOTH NOSTRILS DAILY. 16 g 1  .  hydrochlorothiazide (HYDRODIURIL) 25 MG tablet Take 1 tablet (25 mg total) by mouth daily. (Patient taking differently: Take 25 mg by mouth 2 (two) times daily. ) 90 tablet 3  . metFORMIN (GLUCOPHAGE-XR) 500 MG 24 hr tablet Take 3 tablets (1,500 mg total) by mouth daily with breakfast. 270 tablet 3  . metoprolol tartrate (LOPRESSOR) 25 MG tablet Take 1 tablet (25 mg total) by mouth 3 (three) times daily. 270 tablet 3  . montelukast (SINGULAIR) 10 MG tablet TAKE 1/2 TABLET BY MOUTH 2 TIMES DAILY. 90 tablet 1  . Multiple Vitamin (MULTIVITAMIN WITH MINERALS) TABS Take 1 tablet by mouth daily.    [name] omega-3 acid ethyl esters (LOVAZA) 1 g capsule TAKE 1 CAPSULE BY MOUTH 2 TIMES DAILY. 180 capsule 1  . OVER THE COUNTER MEDICATION Apply 1 application topically daily as needed (for acne). Oxy 10 vanishing cream    . polyethylene glycol powder (GLYCOLAX/MIRALAX) powder MIX 17G WITH LIQUID AND DRINK BY MOUTH DAILY. 527 g 5  . PREVIDENT 5000 DRY MOUTH 1.1 % GEL dental gel APPLY THIN RIBBON AND BRUSH FOR 2 MINUTES DAILY IN PLACE OF CONVENTIONAL TOOTHPASTE  99  . rosuvastatin (CRESTOR) 10 MG tablet TAKE 1 TABLET BY MOUTH ONCE DAILY 90 tablet 3  . thiamine (VITAMIN B-1) 100 MG tablet Take 100 mg by mouth 2 (two) times daily at 10 AM and 5 PM.     . TURMERIC CURCUMIN PO Take by mouth.    . vitamin B-12 (CYANOCOBALAMIN) 500 MCG tablet Take 500-1,000 mcg by mouth daily.      No current facility-administered medications for this visit.    REVIEW OF SYSTEMS:  [X]  denotes positive finding, [ ]  denotes negative finding Cardiac  Comments:  Chest pain or chest pressure:    Shortness of breath upon exertion:    Short of breath when lying flat:    Irregular heart rhythm:        Vascular    Pain in calf, thigh, or hip brought on by ambulation:    Pain in feet at night that wakes you up from your sleep:     Blood clot in your veins:    Leg swelling:           PHYSICAL EXAM: Vitals:   06/19/19 1303  BP:  131/70  Pulse: 68  Resp: 20  SpO2: 98%  Weight: 228 lb 3.2 oz (103.5 kg)  Height: 6\' 4"  (1.93 m)    GENERAL: The patient is a well-nourished male, in no acute distress. The vital signs are documented above. CARDIOVASCULAR: 2-3+ radial  pulses bilaterally.  2-3+ popliteal and pedal pulses bilaterally PULMONARY: There is good air exchange  MUSCULOSKELETAL: There are no major deformities or cyanosis. NEUROLOGIC: No focal weakness or paresthesias are detected. SKIN: There are no ulcers or rashes noted. PSYCHIATRIC: The patient has a normal affect.  DATA:  CT scan was reviewed with actual films reviewed with the patient.  He does have more thromboses of his false lumen.  Has retrograde flow with a dissection flap into his left renal artery with no limitation of flow.  MEDICAL ISSUES: Discussed the significance of this at length with the patient.  He has remained stable and would recommend CT scan again in 1 year.  He is to see Dr. Darcey Nora with CT scan in a year as well.  I feel that the knee is somewhat redundant since Dr. Darcey Nora is following his a sending repair and also reviewing his chronic descending dissection.  If it appeared that he had enlargement and would require evaluation for potential thoracic stent graft, we would be happy to see him again.  He is comfortable with this and will see vascular surgery again on an as-needed basis    Rosetta Posner, MD [facility] Tel [phone] Pager [phone]

## 2019-07-17 ENCOUNTER — Other Ambulatory Visit: Payer: Self-pay | Admitting: Internal Medicine

## 2019-07-17 NOTE — Telephone Encounter (Signed)
Filled 06/07/2019 Alprazolam 1 Mg Tablet 60 #  Last ov 03/19/19 Next ov 09/18/19

## 2019-07-25 ENCOUNTER — Encounter: Payer: Self-pay | Admitting: Internal Medicine

## 2019-08-10 NOTE — Progress Notes (Signed)
CARDIOLOGY CONSULT NOTE       Patient ID: [name] MRN: [id number] DOB/AGE: Sep 12, 1950 69 y.o.  Primary Physician: Hoyt Koch, MD Primary Cardiologist: Johnsie Cancel    HPI:  69 y.o.  status post repair of type A ascending dissection 02/19/12 PVT  with Hemi shield graft used to replace ascending aorta and hemi-arch reconstruction, resuspension of aortic valve Likely brachial plexus injury with axillary artery cannulation on right side. Had staged  AAA repair by Dr Kellie Simmering 10/2012  Echo reviewed 02/14/19 EF 60-65% AV sclerosis mild-moderate  AR mean gradient 12 mmHg  CT F/U 06/13/19   IMPRESSION: 1. Stable findings consistent with prior surgical repair of ascending thoracic aortic dissection. Stable small focal pseudoaneurysm or penetrating ulcer is seen involving the proximal portion of the graft repair that currently measures 3.5 x 1.2 cm. 2. Stable type B dissection is seen involving the descending thoracic aorta with increased thrombosis of the false lumen. Maximum aneurysmal dilatation of 4.6 cm is noted which is not significantly changed compared to prior exam. 3. Status post aortobifemoral graft repair of abdominal aortic aneurysm. The graft and its limbs are widely patent. 4. Dissection flap extends into the proximal portion of the left renal artery which appears to be predominantly supplied by false lumen.  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 AAA (abdominal aortic aneurysm) ([place])   . ALLERGIC RHINITIS   . Anemia    post op, treated /w Fe  . Anxiety   . Arthritis    OA, hands, - thumbs- specifically  . Brachial plexopathy    double crush injury with median nerve damage on the right dominant hand.   [name] COPD (chronic obstructive  pulmonary disease) ([place])    also states he has been told that he has slight emphysema   . Dissecting aortic aneurysm, thoracic ([place]) 02/20/2012   s/p emergent surgical repair  . DM2 (diabetes mellitus, type 2) ([place])   . DVT (deep venous thrombosis) ([facility])    coumadin- x3 months , post op  . ERECTILE DYSFUNCTION, ORGANIC   . GERD (gastroesophageal reflux disease)    related to diet & anxiety   . HYPERLIPIDEMIA   . Hypertension    pt. followwed by Dr. Johnsie Cancel  . MIGRAINE HEADACHE    induced from physical , emotional stress , but sometimes occur randomly, also experiences BPPV  . OBSTRUCTIVE SLEEP APNEA 02/2008 sleep study   moderate & central- not able to tolerate CPAP   . Thyroid nodule 04/21/2015   2.3 cm on right, incidental finding on 03/2015 CT chest - f/u US ordered  . Unspecified vitamin D deficiency     Family History  Problem Relation Age of Onset  . Diabetes Mother   . Stroke Mother   . Dementia Mother   . Deep vein thrombosis Father        PE  . Varicose Veins Father   . Heart disease Maternal Grandmother   . Heart disease Maternal Uncle   . Heart disease Paternal Uncle   . Heart disease Cousin   . Lung cancer Cousin        2 cousins    Social History   Socioeconomic History  . Marital status: Divorced    Spouse name: Not on file  .  Number of children: 2  . Years of education: acad. deg.  . Highest education level: Not on file  Occupational History  . Occupation: internal med resident    Employer: [employer]  Tobacco Use  . Smoking status: Former Smoker    Packs/day: 0.10    Types: Cigarettes    Quit date: 02/18/2012    Years since quitting: 7.4  . Smokeless tobacco: Never Used  . Tobacco comment: 10 cigs/day, quit 02/2012 after TAA emergent repair  Substance and Sexual Activity  . Alcohol use: Yes    Alcohol/week: 0.0 standard drinks    Comment: shot of Tequila every day   . Drug use: No  . Sexual activity: Not on file  Other Topics Concern  . Not on  file  Social History Narrative   Patient is single and lives at home alone.    Arboriculturist.   Patient works at [employer] full time.   Right handed.   Caffeine one cup of coffee daily.    Social Determinants of Health   Financial Resource Strain:   . Difficulty of Paying Living Expenses: Not on file  Food Insecurity:   . Worried About Charity fundraiser in the Last Year: Not on file  . Ran Out of Food in the Last Year: Not on file  Transportation Needs:   . Lack of Transportation (Medical): Not on file  . Lack of Transportation (Non-Medical): Not on file  Physical Activity:   . Days of Exercise per Week: Not on file  . Minutes of Exercise per Session: Not on file  Stress:   . Feeling of Stress : Not on file  Social Connections:   . Frequency of Communication with Friends and Family: Not on file  . Frequency of Social Gatherings with Friends and Family: Not on file  . Attends Religious Services: Not on file  . Active Member of Clubs or Organizations: Not on file  . Attends Archivist Meetings: Not on file  . Marital Status: Not on file  Intimate Partner Violence:   . Fear of Current or Ex-Partner: Not on file  . Emotionally Abused: Not on file  . Physically Abused: Not on file  . Sexually Abused: Not on file    Past Surgical History:  Procedure Laterality Date  . ABDOMINAL AORTIC ANEURYSM REPAIR N/A 11/01/2012   Procedure: ANEURYSM ABDOMINAL AORTIC REPAIR;  Surgeon: Mal Misty, MD;  Location: [facility];  Service: Vascular;  Laterality: N/A;  Resection and Grafting of Abdominal Aortic Aneurysm,(AORTA BI ILIAC)  . Nasal fx without repair    . THORACIC AORTIC ANEURYSM REPAIR  02/20/2012   Procedure: THORACIC ASCENDING ANEURYSM REPAIR (AAA);  Surgeon: Ivin Poot, MD;  Location: [place];  Service: Open Heart Surgery;  Laterality: N/A;      Current Outpatient Medications:  .  ALPRAZolam (XANAX) 1 MG tablet, TAKE 1/2 TABLET BY MOUTH 4 TIMES A DAY, Disp: 60  tablet, Rfl: 5 .  aspirin EC 81 MG tablet, Take 81 mg by mouth daily with lunch., Disp: , Rfl:  .  azelastine (ASTELIN) 0.1 % nasal spray, PLACE 2 SPRAYS INTO EACH NOSTRILS TWICE A DAY, Disp: 30 mL, Rfl: 1 .  bisacodyl (DULCOLAX) 10 MG suppository, Place 1 suppository (10 mg total) rectally daily as needed for moderate constipation., Disp: 12 suppository, Rfl: 0 .  calcium carbonate (TUMS EX) 750 MG chewable tablet, Chew 1 tablet by mouth daily as needed for heartburn. , Disp: ,  Rfl:  .  cholecalciferol (VITAMIN D) 1000 UNITS tablet, Take 2,000 Units by mouth daily., Disp: , Rfl:  .  EPINEPHRINE 0.3 mg/0.3 mL IJ SOAJ injection, INJECT 0.3 MLS INTO THE MUSCLE ONCE AS DIRECTED, Disp: 2 each, Rfl: 3 .  fexofenadine (ALLEGRA) 180 MG tablet, Take 90 mg by mouth 2 (two) times daily. , Disp: , Rfl:  .  fluticasone (FLONASE) 50 MCG/ACT nasal spray, PLACE 2 SPRAYS INTO BOTH NOSTRILS DAILY., Disp: 16 g, Rfl: 1 .  hydrochlorothiazide (HYDRODIURIL) 25 MG tablet, Take 1 tablet (25 mg total) by mouth daily., Disp: 90 tablet, Rfl: 3 .  metFORMIN (GLUCOPHAGE-XR) 500 MG 24 hr tablet, Take 3 tablets (1,500 mg total) by mouth daily with breakfast., Disp: 270 tablet, Rfl: 3 .  metoprolol tartrate (LOPRESSOR) 25 MG tablet, Take 1 tablet (25 mg total) by mouth 3 (three) times daily., Disp: 270 tablet, Rfl: 3 .  montelukast (SINGULAIR) 10 MG tablet, TAKE 1/2 TABLET BY MOUTH 2 TIMES DAILY., Disp: 90 tablet, Rfl: 1 .  Multiple Vitamin (MULTIVITAMIN WITH MINERALS) TABS, Take 1 tablet by mouth daily., Disp: , Rfl:  .  omega-3 acid ethyl esters (LOVAZA) 1 g capsule, TAKE 1 CAPSULE BY MOUTH 2 TIMES DAILY., Disp: 180 capsule, Rfl: 1 .  OVER THE COUNTER MEDICATION, Apply 1 application topically daily as needed (for acne). Oxy 10 vanishing cream, Disp: , Rfl:  .  polyethylene glycol powder (GLYCOLAX/MIRALAX) powder, MIX 17G WITH LIQUID AND DRINK BY MOUTH DAILY., Disp: 527 g, Rfl: 5 .  PREVIDENT 5000 DRY MOUTH 1.1 % GEL dental gel,  APPLY THIN RIBBON AND BRUSH FOR 2 MINUTES DAILY IN PLACE OF CONVENTIONAL TOOTHPASTE, Disp: , Rfl: 99 .  rosuvastatin (CRESTOR) 10 MG tablet, TAKE 1 TABLET BY MOUTH ONCE DAILY, Disp: 90 tablet, Rfl: 3 .  thiamine (VITAMIN B-1) 100 MG tablet, Take 100 mg by mouth 2 (two) times daily at 10 AM and 5 PM. , Disp: , Rfl:  .  TURMERIC CURCUMIN PO, Take by mouth., Disp: , Rfl:  .  vitamin B-12 (CYANOCOBALAMIN) 500 MCG tablet, Take 500-1,000 mcg by mouth daily. , Disp: , Rfl:     Physical Exam:  Affect appropriate Healthy:  appears stated age 24: normal Neck supple with no adenopathy JVP normal left bruits no thyromegaly Lungs clear with no wheezing and good diaphragmatic motion Heart:  S1/S2 SEM and AR murmur, no rub, gallop or click PMI normal post sternotomy  Abdomen: benighn, BS positve, no tenderness, no AAA no bruit.  No HSM or HJR post AAA repair  Distal pulses intact with no bruits No edema Neuro non-focal Skin warm and dry No muscular weakness   Labs:   Lab Results  Component Value Date   WBC 6.3 03/19/2019   HGB 15.2 03/19/2019   HCT 45.3 03/19/2019   MCV 94.4 03/19/2019   PLT 157.0 03/19/2019   No results for input(s): NA, K, CL, CO2, BUN, CREATININE, CALCIUM, PROT, BILITOT, ALKPHOS, ALT, AST, GLUCOSE in the last 168 hours.  Invalid input(s): LABALBU Lab Results  Component Value Date   TROPONINI <0.30 04/02/2012    Lab Results  Component Value Date   CHOL 131 03/19/2019   CHOL 120 03/14/2018   CHOL 131 02/25/2017   Lab Results  Component Value Date   HDL 36.30 (L) 03/19/2019   HDL 33.40 (L) 03/14/2018   HDL 45.40 02/25/2017   Lab Results  Component Value Date   LDLCALC 61 03/19/2019   [place] 68 03/14/2018  LDLCALC 65 02/25/2017   Lab Results  Component Value Date   TRIG 165.0 (H) 03/19/2019   TRIG 94.0 03/14/2018   TRIG 102.0 02/25/2017   Lab Results  Component Value Date   CHOLHDL 4 03/19/2019   CHOLHDL 4 03/14/2018   CHOLHDL 3 02/25/2017    Lab Results  Component Value Date   LDLDIRECT 114 (H) 03/21/2010      Radiology: No results found.  EKG: SR rate 56 normal 06/24/18 08/14/19 SR rate 73 nonspecific ST changes    ASSESSMENT AND PLAN:  Dissection:  Grafted aortic root  stable mild/moderate AR and mild AS in re suspended valve   AAA:  F/U CT per Dr Early  for false lumen  DM Discussed low carb diet.  Target hemoglobin A1c is 6.5 or less.  Continue current medications.  Chol:    Cholesterol is at goal 61 .  Continue current dose of statin and diet Rx.  No myalgias or side effects.  F/U  LFT's in 6 months.  Bruit: plaque no stenosis duplex 03/13/19 f/u September 2022   Cognitive:  Some issues with memory ? Migrainous aura f/u neuro More anxiety no social supports   F/u in 6 months with echo for AS/AR    Signed: Jenkins Rouge 08/14/2019, 4:45 PM

## 2019-08-14 ENCOUNTER — Encounter: Payer: Self-pay | Admitting: Cardiovascular Disease

## 2019-08-14 ENCOUNTER — Ambulatory Visit: Payer: HMO | Admitting: Cardiovascular Disease

## 2019-08-14 ENCOUNTER — Other Ambulatory Visit: Payer: Self-pay

## 2019-08-14 VITALS — BP 128/72 | HR 73 | Ht 76.0 in | Wt 229.1 lb

## 2019-08-14 DIAGNOSIS — I351 Nonrheumatic aortic (valve) insufficiency: Secondary | ICD-10-CM

## 2019-08-14 DIAGNOSIS — I350 Nonrheumatic aortic (valve) stenosis: Secondary | ICD-10-CM

## 2019-08-14 NOTE — Patient Instructions (Addendum)
Medication Instructions:  *If you need a refill on your cardiac medications before your next appointment, please call your pharmacy*   Lab Work: If you have labs (blood work) drawn today and your tests are completely normal, you will receive your results only by: [name] MyChart Message (if you have MyChart) OR . A paper copy in the mail If you have any lab test that is abnormal or we need to change your treatment, we will call you to review the results.  Testing/Procedures: Your physician has requested that you have an echocardiogram in September. Echocardiography is a painless test that uses sound waves to create images of your heart. It provides your doctor with information about the size and shape of your heart and how well your heart's chambers and valves are working. This procedure takes approximately one hour. There are no restrictions for this procedure.  Follow-Up: At [facility], you and your health needs are our priority.  As part of our continuing mission to provide you with exceptional heart care, we have created designated Provider Care Teams.  These Care Teams include your primary Cardiologist (physician) and Advanced Practice Providers (APPs -  Physician Assistants and Nurse Practitioners) who all work together to provide you with the care you need, when you need it.  We recommend signing up for the patient portal called "MyChart".  Sign up information is provided on this After Visit Summary.  MyChart is used to connect with patients for Virtual Visits (Telemedicine).  Patients are able to view lab/test results, encounter notes, upcoming appointments, etc.  Non-urgent messages can be sent to your provider as well.   To learn more about what you can do with MyChart, go to [email or web address].    Your next appointment:   6 month(s)  The format for your next appointment:   In Person  Provider:   You may see Dr. Johnsie Cancel or one of the following Advanced Practice Providers on your  designated Care Team:    Truitt Merle, NP  Cecilie Kicks, NP  Kathyrn Drown, NP

## 2019-08-21 ENCOUNTER — Other Ambulatory Visit: Payer: Self-pay | Admitting: Internal Medicine

## 2019-08-24 ENCOUNTER — Other Ambulatory Visit: Payer: Self-pay | Admitting: Internal Medicine

## 2019-09-04 ENCOUNTER — Other Ambulatory Visit: Payer: Self-pay

## 2019-09-04 NOTE — Patient Outreach (Signed)
  [facility] [facility]) Care Management Chronic Special Needs Program    09/04/2019  Name: [name], DOB: Dec 18, 1950  MRN: [id number]   [name] is enrolled in a chronic special needs plan for Diabetes Telephone call to client for health risk assessment completion / review. Unable to reach. HIPAA compliant voice message left with call back phone number and return call request.   PLAN; RNCM will attempt 2nd  telephone call to client within 1 week   .  [name] RN,BSN,CCM Chronic Care Management Coordinator [facility] [phone] .

## 2019-09-05 ENCOUNTER — Other Ambulatory Visit: Payer: Self-pay

## 2019-09-05 ENCOUNTER — Ambulatory Visit: Payer: HMO | Admitting: Cardiothoracic Surgery

## 2019-09-05 ENCOUNTER — Encounter: Payer: Self-pay | Admitting: Cardiothoracic Surgery

## 2019-09-05 VITALS — BP 133/74 | HR 60 | Temp 97.7°F | Resp 16 | Ht 76.0 in | Wt 229.0 lb

## 2019-09-05 DIAGNOSIS — I7100 Dissection of unspecified site of aorta: Secondary | ICD-10-CM | POA: Diagnosis not present

## 2019-09-05 DIAGNOSIS — Z5189 Encounter for other specified aftercare: Secondary | ICD-10-CM | POA: Insufficient documentation

## 2019-09-05 DIAGNOSIS — Z9889 Other specified postprocedural states: Secondary | ICD-10-CM | POA: Diagnosis not present

## 2019-09-05 DIAGNOSIS — I7101 Dissection of ascending aorta: Secondary | ICD-10-CM

## 2019-09-05 NOTE — Progress Notes (Signed)
PCP is Hoyt Koch, MD Referring Provider is Hoyt Koch, *  Chief Complaint  Patient presents with  . Routine Post Op    concerned with "knot" at the distal end of his sternum    HPI: The patient presents for evaluation of his lower sternal incision with concerns about a deformity which he has notices recently-he had emergency repair of a [place] type A aortic dissection 2013.  I reviewed the images of his last CT scan of the chest 3 months ago which shows the sternum intact, no soft tissue collection, no evidence of ventral hernia.   Past Medical History:  Diagnosis Date  . AAA (abdominal aortic aneurysm) ([place])   . ALLERGIC RHINITIS   . Anemia    post op, treated /w Fe  . Anxiety   . Arthritis    OA, hands, - thumbs- specifically  . Brachial plexopathy    double crush injury with median nerve damage on the right dominant hand.   [name] COPD (chronic obstructive pulmonary disease) ([place])    also states he has been told that he has slight emphysema   . Dissecting aortic aneurysm, thoracic ([place]) 02/20/2012   s/p emergent surgical repair  . DM2 (diabetes mellitus, type 2) ([place])   . DVT (deep venous thrombosis) ([facility])    coumadin- x3 months , post op  . ERECTILE DYSFUNCTION, ORGANIC   . GERD (gastroesophageal reflux disease)    related to diet & anxiety   . HYPERLIPIDEMIA   . Hypertension    pt. followwed by Dr. Johnsie Cancel  . MIGRAINE HEADACHE    induced from physical , emotional stress , but sometimes occur randomly, also experiences BPPV  . OBSTRUCTIVE SLEEP APNEA 02/2008 sleep study   moderate & central- not able to tolerate CPAP   . Thyroid nodule 04/21/2015   2.3 cm on right, incidental finding on 03/2015 CT chest - f/u US ordered  . Unspecified vitamin D deficiency     Past Surgical History:  Procedure Laterality Date  . ABDOMINAL AORTIC ANEURYSM REPAIR N/A 11/01/2012   Procedure: ANEURYSM ABDOMINAL AORTIC REPAIR;  Surgeon: Mal Misty, MD;  Location: [facility]  OR;  Service: Vascular;  Laterality: N/A;  Resection and Grafting of Abdominal Aortic Aneurysm,(AORTA BI ILIAC)  . Nasal fx without repair    . THORACIC AORTIC ANEURYSM REPAIR  02/20/2012   Procedure: THORACIC ASCENDING ANEURYSM REPAIR (AAA);  Surgeon: Ivin Poot, MD;  Location: [place];  Service: Open Heart Surgery;  Laterality: N/A;    Family History  Problem Relation Age of Onset  . Diabetes Mother   . Stroke Mother   . Dementia Mother   . Deep vein thrombosis Father        PE  . Varicose Veins Father   . Heart disease Maternal Grandmother   . Heart disease Maternal Uncle   . Heart disease Paternal Uncle   . Heart disease Cousin   . Lung cancer Cousin        2 cousins    Social History Social History   Tobacco Use  . Smoking status: Former Smoker    Packs/day: 0.10    Types: Cigarettes    Quit date: 02/18/2012    Years since quitting: 7.5  . Smokeless tobacco: Never Used  . Tobacco comment: 10 cigs/day, quit 02/2012 after TAA emergent repair  Substance Use Topics  . Alcohol use: Yes    Alcohol/week: 0.0 standard drinks    Comment: shot of Tequila every day   .  Drug use: No    Current Outpatient Medications  Medication Sig Dispense Refill  . ALPRAZolam (XANAX) 1 MG tablet TAKE 1/2 TABLET BY MOUTH 4 TIMES A DAY 60 tablet 5  . aspirin EC 81 MG tablet Take 81 mg by mouth daily with lunch.    [name] azelastine (ASTELIN) 0.1 % nasal spray PLACE 2 SPRAYS INTO EACH NOSTRIL TWICE A DAY 30 mL 0  . bisacodyl (DULCOLAX) 10 MG suppository Place 1 suppository (10 mg total) rectally daily as needed for moderate constipation. 12 suppository 0  . calcium carbonate (TUMS EX) 750 MG chewable tablet Chew 1 tablet by mouth daily as needed for heartburn.     . cholecalciferol (VITAMIN D) 1000 UNITS tablet Take 2,000 Units by mouth daily.    [name] EPINEPHRINE 0.3 mg/0.3 mL IJ SOAJ injection INJECT 0.3 MLS INTO THE MUSCLE ONCE AS DIRECTED 2 each 3  . fexofenadine (ALLEGRA) 180 MG tablet Take 90 mg  by mouth 2 (two) times daily.     . fluticasone (FLONASE) 50 MCG/ACT nasal spray PLACE 2 SPRAYS INTO BOTH NOSTRILS DAILY. 16 g 0  . hydrochlorothiazide (HYDRODIURIL) 25 MG tablet TAKE 1 TABLET (25 MG TOTAL) BY MOUTH DAILY. 90 tablet 0  . metFORMIN (GLUCOPHAGE-XR) 500 MG 24 hr tablet Take 3 tablets (1,500 mg total) by mouth daily with breakfast. 270 tablet 3  . metoprolol tartrate (LOPRESSOR) 25 MG tablet Take 1 tablet (25 mg total) by mouth 3 (three) times daily. 270 tablet 3  . montelukast (SINGULAIR) 10 MG tablet TAKE 1/2 TABLET BY MOUTH 2 TIMES DAILY. 90 tablet 1  . Multiple Vitamin (MULTIVITAMIN WITH MINERALS) TABS Take 1 tablet by mouth daily.    [name] omega-3 acid ethyl esters (LOVAZA) 1 g capsule TAKE 1 CAPSULE BY MOUTH 2 TIMES DAILY. 180 capsule 1  . OVER THE COUNTER MEDICATION Apply 1 application topically daily as needed (for acne). Oxy 10 vanishing cream    . polyethylene glycol powder (GLYCOLAX/MIRALAX) powder MIX 17G WITH LIQUID AND DRINK BY MOUTH DAILY. 527 g 5  . PREVIDENT 5000 DRY MOUTH 1.1 % GEL dental gel APPLY THIN RIBBON AND BRUSH FOR 2 MINUTES DAILY IN PLACE OF CONVENTIONAL TOOTHPASTE  99  . rosuvastatin (CRESTOR) 10 MG tablet TAKE 1 TABLET BY MOUTH ONCE DAILY 90 tablet 3  . thiamine (VITAMIN B-1) 100 MG tablet Take 100 mg by mouth 2 (two) times daily at 10 AM and 5 PM.     . TURMERIC CURCUMIN PO Take by mouth.    . vitamin B-12 (CYANOCOBALAMIN) 500 MCG tablet Take 500-1,000 mcg by mouth daily.      No current facility-administered medications for this visit.    Allergies  Allergen Reactions  . Testosterone Shortness Of Breath    Medication:Androgel Pt states trouble breathing at night  . Codeine Nausea And Vomiting  . Gabapentin     "drunk" feeling  . Ultram [Tramadol] Nausea And Vomiting  . Albuterol Other (See Comments)    Panic attack    Review of Systems   He has been vaccinated for the COVID-19 virus He has lost 8 pounds in the past 3 months He denies chest  pain He does complain of fatigue He has been been evaluated by his cardiologist, Dr. Johnsie Cancel for his mild AI after resuspension of the aortic valve at the time of repair of his dissection  BP 133/74 (BP Location: Left Arm, Patient Position: Sitting, Cuff Size: Large)   Pulse 60   Temp 97.7 F (36.5  C)   Resp 16   Ht 6\' 4"  (1.93 m)   Wt 229 lb (103.9 kg)   SpO2 97% Comment: RA  BMI 27.87 kg/m  Physical Exam      Exam    General- alert and comfortable. Inspection and palpation of the sternal wound reveals it to be well-healed and intact.  The broad-based calcified distal sternum-proximal xiphoid bone is palpable but not flexible, dislocated, or tender and there is no sign of infection.    Neck- no JVD, no cervical adenopathy palpable, no carotid bruit   Lungs- clear without rales, wheezes   Cor- regular rate and rhythm, no murmur , gallop   Abdomen- soft, non-tender   Extremities - warm, non-tender, minimal edema   Neuro- oriented, appropriate, no focal weakness   Diagnostic Tests: None performed today  Impression: No abnormality of the sternal wound or xiphoid.  Plan: Patient will return as scheduled for his annual CTA December 2021.   Len Childs, MD [facility] [phone]

## 2019-09-06 ENCOUNTER — Ambulatory Visit: Payer: Self-pay

## 2019-09-07 ENCOUNTER — Other Ambulatory Visit: Payer: Self-pay

## 2019-09-07 NOTE — Patient Outreach (Signed)
  [name]) Care Management Chronic Special Needs Program    09/07/2019  Name: [name], DOB: Oct 13, 1950  MRN: [id number]   [name] is enrolled in a chronic special needs plan for DiabetesTelephone call to client for health risk assessment completion / review. Attempted call to listed home number. Unable to reach or leave voice message due to phone only ringing. Attempted call to alternate mobile number. HIPAA compliant voice message left with call back phone number and return call request.   PLAN; RNCM will attempt 3rd  telephone call to client within 1week    .  Ryan Plowman RN,BSN,CCM [facility] [phone]

## 2019-09-08 ENCOUNTER — Other Ambulatory Visit: Payer: Self-pay

## 2019-09-08 NOTE — Patient Outreach (Signed)
[facility]) Care Management Chronic Special Needs Program  09/08/2019  Name: JONQUEZ COURIER DOB: May 04, 1951  MRN: [id number]  Mr. Keanthony Rupp is enrolled in a chronic special needs plan for Diabetes. Chronic Care Management Coordinator telephoned client to review health risk assessment and to develop individualized care plan. Unable to reach. HIPAA complaint voice message left with call back phone number and return call request.  Individualized care plan completed with available data and completed health risk assessment.   Goals Addressed            This Visit's Progress   .  Acknowledge receipt of Advanced Directive package       RN care manager will send client Advanced Directive package on 09/10/19 Contact your assigned RN care manager [phone]) to refer to social worker if assistance is needed with Advanced Directive.     . Client understands the importance of follow-up with providers by attending scheduled visits       Primary care provider visit: 06/19/19 Endocrinologist visit 04/12/19 Continue to maintain and keep follow up visits with your providers.      . Client will report no worsening of symptoms related to heart disease within the next  12 months       RN case manager will send client education article: Heart attack Call 911 for heart attack symptoms. Take your medications as prescribed. Keep scheduled appointments with your providers    . Client will verbalize knowledge of self management of Hypertension as evidences by BP reading of 140/90 or less; or as defined by provider       RN care manager will send client education article: Diabetes and blood pressure, High blood pressure: What you can do.  Take your medications as prescribed. Ask your provider, " What is my target blood pressure range." Follow up with your provider as recommended.      [name] HEMOGLOBIN A1C < 7.0       Diabetes self management actions:  Glucose monitoring per  provider recommendation  Check feet daily  Visit provider every 3-6 months as directed  Hbg A1C level every 3-6 months.  Eye Exam yearly  Carbohydrate controlled meal planning  Taking diabetes medication as prescribed by provider  Physical activity     . Maintain timely refills of diabetic medication as prescribed within the year .       Review of clients medical record indicates client maintains timely refills of diabetic medications Take your medications as prescribed Follow up with your doctor if you have questions Contact your assigned RN case manager [phone]) if your have difficulty obtaining your medications    . Obtain annual  Lipid Profile, LDL-C       Lipid profile completed 03/19/19 The goal for LDL is less than 70 mg/ dl as you are at high risk for complications try to avoid saturated fats, trans-fats, and eat more fiber. RN case manager will send client education article: Heart healthy diet.     . Obtain Annual Eye (retinal)  Exam        Eye exam 01/12/19 Plan to schedule your eye exam yearly     . Obtain Annual Foot Exam       Foot exam 03/19/19 Diabetes foot care - Check feet daily at home (look for skin color changes, cuts, sores or cracks in the skin, swelling of feet or ankles, ingrown or fungal toenails, corn or calluses). Report these findings to your doctor - Wash feet with soap  and water, dry feet well especially between toes - Moisturize your feet but not between the toes - Always wear shoes that protect your whole feet.      . Obtain annual screen for micro albuminuria (urine) , nephropathy (kidney problems)       Micro Albuminuria 03/19/19 Attend yearly physicals and follow up visits with your providers and complete labs as recommended.     Illa Level Hemoglobin A1C at least 2 times per year       Hgb A1c completed 06/19/19,  03/19/19 Continue to keep your follow up appointments with your provider and have lab work completed as recommended.     .  Visit Primary Care Provider or Endocrinologist at least 2 times per year         Assessment: Client is not meeting diabetes self-management goal of hemoglobin A1C of <7.0% with most recent reading of 7.7 % on 06/19/19.  Notify MD of any changes in condition prior to scheduled appointment. RNCM contact name and number and 24 hour nurse advise line [phone] by mail   Plan:  Send successful outreach letter with a copy of their individualized care plan, Send individual care plan to provider and Send educational material, Send Advanced directive packet  Chronic care management coordination will outreach in: 12 months   [name] RN,BSN,CCM [facility] [phone]

## 2019-09-10 ENCOUNTER — Ambulatory Visit: Payer: Self-pay

## 2019-09-18 ENCOUNTER — Ambulatory Visit: Payer: 59 | Admitting: Internal Medicine

## 2019-09-18 ENCOUNTER — Other Ambulatory Visit: Payer: Self-pay | Admitting: Internal Medicine

## 2019-09-19 ENCOUNTER — Other Ambulatory Visit: Payer: Self-pay

## 2019-09-19 ENCOUNTER — Encounter: Payer: Self-pay | Admitting: Internal Medicine

## 2019-09-19 ENCOUNTER — Ambulatory Visit (INDEPENDENT_AMBULATORY_CARE_PROVIDER_SITE_OTHER): Payer: HMO | Admitting: Internal Medicine

## 2019-09-19 ENCOUNTER — Ambulatory Visit (INDEPENDENT_AMBULATORY_CARE_PROVIDER_SITE_OTHER): Payer: HMO

## 2019-09-19 VITALS — BP 138/82 | HR 65 | Temp 98.0°F | Ht 76.0 in | Wt 226.6 lb

## 2019-09-19 DIAGNOSIS — M19042 Primary osteoarthritis, left hand: Secondary | ICD-10-CM | POA: Diagnosis not present

## 2019-09-19 DIAGNOSIS — M79645 Pain in left finger(s): Secondary | ICD-10-CM

## 2019-09-19 NOTE — Progress Notes (Signed)
   Subjective:   Patient ID: [name], male    DOB: 09/06/1950, 69 y.o.   MRN: [id number]  HPI The patient is a 69 YO man coming in for left thumb pain. Started months ago and there is a prominence. Denies change in ROM. Overall not bad but does limit strength in that wrist. Denies numbness or weakness in that arm. No recent injury or overuse. He is very recently retired and struggling to find out what to do in retirement. This is more difficult given the pandemic.   Review of Systems  Constitutional: Negative.   HENT: Negative.   Eyes: Negative.   Respiratory: Negative for cough, chest tightness and shortness of breath.   Cardiovascular: Negative for chest pain, palpitations and leg swelling.  Gastrointestinal: Negative for abdominal distention, abdominal pain, constipation, diarrhea, nausea and vomiting.  Musculoskeletal: Positive for arthralgias.  Skin: Negative.   Neurological: Negative.   Psychiatric/Behavioral: Negative.     Objective:  Physical Exam Constitutional:      Appearance: He is well-developed.  HENT:     Head: Normocephalic and atraumatic.  Cardiovascular:     Rate and Rhythm: Normal rate and regular rhythm.  Pulmonary:     Effort: Pulmonary effort is normal. No respiratory distress.     Breath sounds: Normal breath sounds. No wheezing or rales.  Abdominal:     General: Bowel sounds are normal. There is no distension.     Palpations: Abdomen is soft.     Tenderness: There is no abdominal tenderness. There is no rebound.  Musculoskeletal:     Cervical back: Normal range of motion.     Comments: Slight bony prominence left thumb at the base  Skin:    General: Skin is warm and dry.  Neurological:     Mental Status: He is alert and oriented to person, place, and time.     Coordination: Coordination normal.     Vitals:   09/19/19 1055  BP: 138/82  Pulse: 65  Temp: 98 F (36.7 C)  TempSrc: Oral  SpO2: 96%  Weight: 226 lb 9.6 oz (102.8 kg)    Height: 6\' 4"  (1.93 m)    This visit occurred during the SARS-CoV-2 public health emergency.  Safety protocols were in place, including screening questions prior to the visit, additional usage of staff PPE, and extensive cleaning of exam room while observing appropriate contact time as indicated for disinfecting solutions.   Assessment & Plan:

## 2019-09-19 NOTE — Patient Instructions (Signed)
We have cleaned out the right ear.   We will do an x-ray of the left hand.

## 2019-09-22 DIAGNOSIS — M79645 Pain in left finger(s): Secondary | ICD-10-CM | POA: Insufficient documentation

## 2019-09-22 NOTE — Assessment & Plan Note (Signed)
Checking x-ray and adjust therapy as needed.

## 2019-10-08 ENCOUNTER — Encounter: Payer: Self-pay | Admitting: Internal Medicine

## 2019-10-11 ENCOUNTER — Ambulatory Visit (INDEPENDENT_AMBULATORY_CARE_PROVIDER_SITE_OTHER): Payer: HMO | Admitting: Internal Medicine

## 2019-10-11 ENCOUNTER — Encounter: Payer: Self-pay | Admitting: Internal Medicine

## 2019-10-11 ENCOUNTER — Other Ambulatory Visit: Payer: Self-pay

## 2019-10-11 VITALS — BP 142/70 | HR 72 | Temp 98.8°F | Ht 74.0 in | Wt 228.4 lb

## 2019-10-11 DIAGNOSIS — Z1211 Encounter for screening for malignant neoplasm of colon: Secondary | ICD-10-CM

## 2019-10-11 DIAGNOSIS — H938X1 Other specified disorders of right ear: Secondary | ICD-10-CM

## 2019-10-11 DIAGNOSIS — M79645 Pain in left finger(s): Secondary | ICD-10-CM

## 2019-10-11 NOTE — Progress Notes (Signed)
   Subjective:   Patient ID: [name], male    DOB: 23-Oct-1950, 69 y.o.   MRN: [id number]  HPI The patient is a 69 YO man coming in for right ear pressure (attempted ear lavage at last visit which was largely unsuccessful, did debrox once and got a pea sized amount of wax out, still having sensation of water in ear and pressure, wants to ensure that the wax is gone) and left thumb pain (wanted to talk more about the recent x-ray done, thumb does hurt intermittently and feels like it has been used a lot even when first getting up, does have that palpable lump near thumb, no new swelling or injury) and colon cancer screening (due for cologuard and wants to know if that is still a good option, does not really want to do a colonoscopy, denies change in stools, blood in stool, no new family history of colon or rectal cancer).   Review of Systems  Constitutional: Negative.   HENT: Positive for ear pain. Negative for congestion, postnasal drip, rhinorrhea and sinus pressure.   Eyes: Negative.   Respiratory: Negative for cough, chest tightness and shortness of breath.   Cardiovascular: Negative for chest pain, palpitations and leg swelling.  Gastrointestinal: Negative for abdominal distention, abdominal pain, constipation, diarrhea, nausea and vomiting.  Musculoskeletal: Positive for arthralgias and myalgias.  Skin: Negative.   Neurological: Negative.   Psychiatric/Behavioral: Negative.     Objective:  Physical Exam Constitutional:      Appearance: He is well-developed.  HENT:     Head: Normocephalic and atraumatic.     Comments: No wax obstructing either ear canal and no bulging TM on either side    Right Ear: Tympanic membrane normal.     Left Ear: Tympanic membrane normal.  Cardiovascular:     Rate and Rhythm: Normal rate and regular rhythm.  Pulmonary:     Effort: Pulmonary effort is normal. No respiratory distress.     Breath sounds: Normal breath sounds. No wheezing or rales.    Abdominal:     General: Bowel sounds are normal. There is no distension.     Palpations: Abdomen is soft.     Tenderness: There is no abdominal tenderness. There is no rebound.  Musculoskeletal:        General: Tenderness present.     Cervical back: Normal range of motion.     Comments: Left thumb tenderness   Skin:    General: Skin is warm and dry.  Neurological:     Mental Status: He is alert and oriented to person, place, and time.     Coordination: Coordination normal.     Vitals:   10/11/19 1515  BP: (!) 142/70  Pulse: 72  Temp: 98.8 F (37.1 C)  SpO2: 98%  Weight: 228 lb 6.4 oz (103.6 kg)  Height: 6\' 2"  (1.88 m)    This visit occurred during the SARS-CoV-2 public health emergency.  Safety protocols were in place, including screening questions prior to the visit, additional usage of staff PPE, and extensive cleaning of exam room while observing appropriate contact time as indicated for disinfecting solutions.   Assessment & Plan:

## 2019-10-11 NOTE — Patient Instructions (Addendum)
We can send you to the hand specialist if you want just let us know.  The right ear is clear.   We will get the cologuard ordered.

## 2019-10-12 ENCOUNTER — Encounter: Payer: Self-pay | Admitting: Internal Medicine

## 2019-10-12 DIAGNOSIS — Z1211 Encounter for screening for malignant neoplasm of colon: Secondary | ICD-10-CM | POA: Insufficient documentation

## 2019-10-12 DIAGNOSIS — H938X1 Other specified disorders of right ear: Secondary | ICD-10-CM | POA: Insufficient documentation

## 2019-10-12 NOTE — Assessment & Plan Note (Signed)
Discussed x-ray results. He elects to manage with otc tylenol for now. If worsening he knows to let us know and we will get him in with hand surgery for potential injection.

## 2019-10-12 NOTE — Assessment & Plan Note (Signed)
We did discuss in detail the potential upside and downsides with cologuard versus colonoscopy. He wishes to proceed with ordering cologuard and then he will decide if he wishes to send back.

## 2019-10-12 NOTE — Assessment & Plan Note (Signed)
Right ear without wax or fluid in the TM. Could be allergic and advised he can use allergy medication if desired.

## 2019-10-16 ENCOUNTER — Telehealth: Payer: Self-pay

## 2019-10-16 NOTE — Telephone Encounter (Signed)
Cologuard order form given to office rep for cologuard s.[email or web address]

## 2019-10-17 ENCOUNTER — Other Ambulatory Visit: Payer: Self-pay | Admitting: Internal Medicine

## 2019-11-13 ENCOUNTER — Other Ambulatory Visit: Payer: Self-pay

## 2019-11-13 NOTE — Telephone Encounter (Signed)
This encounter was created in error - please disregard.

## 2019-11-13 NOTE — Patient Outreach (Deleted)
[facility]) Care Management   11/13/2019  [name] 1950/06/16 [id number]  [name] is an 69 y.o. male  Subjective:   Objective:   Review of Systems  Physical Exam  Encounter Medications:   Outpatient Encounter Medications as of 11/13/2019  Medication Sig Note   ALPRAZolam (XANAX) 1 MG tablet TAKE 1/2 TABLET BY MOUTH 4 TIMES A DAY    aspirin EC 81 MG tablet Take 81 mg by mouth daily with lunch.    azelastine (ASTELIN) 0.1 % nasal spray PLACE 2 SPRAYS INTO EACH NOSTRIL TWICE A DAY    bisacodyl (DULCOLAX) 10 MG suppository Place 1 suppository (10 mg total) rectally daily as needed for moderate constipation.    calcium carbonate (TUMS EX) 750 MG chewable tablet Chew 1 tablet by mouth daily as needed for heartburn.     cholecalciferol (VITAMIN D) 1000 UNITS tablet Take 2,000 Units by mouth daily.    EPINEPHRINE 0.3 mg/0.3 mL IJ SOAJ injection INJECT 0.3 MLS INTO THE MUSCLE ONCE AS DIRECTED    fexofenadine (ALLEGRA) 180 MG tablet Take 90 mg by mouth 2 (two) times daily.     fluticasone (FLONASE) 50 MCG/ACT nasal spray PLACE 2 SPRAYS INTO BOTH NOSTRILS DAILY.    hydrochlorothiazide (HYDRODIURIL) 25 MG tablet Take 1 tablet (25 mg total) by mouth daily. Annual appt due in Oct must see provider for future refills    metFORMIN (GLUCOPHAGE-XR) 500 MG 24 hr tablet Take 3 tablets (1,500 mg total) by mouth daily with breakfast.    metoprolol tartrate (LOPRESSOR) 25 MG tablet Take 1 tablet (25 mg total) by mouth 3 (three) times daily.    montelukast (SINGULAIR) 10 MG tablet TAKE 1/2 TABLET BY MOUTH 2 TIMES DAILY.    Multiple Vitamin (MULTIVITAMIN WITH MINERALS) TABS Take 1 tablet by mouth daily.    omega-3 acid ethyl esters (LOVAZA) 1 g capsule Take 1 capsule (1 g total) by mouth 2 (two) times daily. Annual appt due in Oct must see provider for future refills    OVER THE COUNTER MEDICATION Apply 1 application topically daily as needed (for acne). Oxy  10 vanishing cream    polyethylene glycol powder (GLYCOLAX/MIRALAX) powder MIX 17G WITH LIQUID AND DRINK BY MOUTH DAILY.    PREVIDENT 5000 DRY MOUTH 1.1 % GEL dental gel APPLY THIN RIBBON AND BRUSH FOR 2 MINUTES DAILY IN PLACE OF CONVENTIONAL TOOTHPASTE    rosuvastatin (CRESTOR) 10 MG tablet TAKE 1 TABLET BY MOUTH ONCE DAILY    thiamine (VITAMIN B-1) 100 MG tablet Take 100 mg by mouth 2 (two) times daily at 10 AM and 5 PM.     TURMERIC CURCUMIN PO Take by mouth.    vitamin B-12 (CYANOCOBALAMIN) 500 MCG tablet Take 500-1,000 mcg by mouth daily.     [DISCONTINUED] oxyCODONE (OXY IR/ROXICODONE) 5 MG immediate release tablet Take 1 tablet (5 mg total) by mouth every 4 (four) hours as needed. 12/05/2012: Pt states he stop too strong   No facility-administered encounter medications on file as of 11/13/2019.    Functional Status:   In your present state of health, do you have any difficulty performing the following activities: 09/19/2019 09/08/2019  Hearing? N -  Vision? N -  Difficulty concentrating or making decisions? Y -  Walking or climbing stairs? N -  Dressing or bathing? N N  Doing errands, shopping? N N  Preparing Food and eating ? - N  Using the Toilet? - N  Managing your Medications? - N  Managing your Finances? - N  Housekeeping or managing your Housekeeping? - N  Some recent data might be hidden    Fall/Depression Screening:    Fall Risk  09/19/2019 08/20/2014 02/06/2014  Falls in the past year? 0 No Yes  Number falls in past yr: 0 - 2 or more  Injury with Fall? 0 - -  Risk for fall due to : - Other (Comment) -  Some encounter information is confidential and restricted. Go to Review Flowsheets activity to see all data.   PHQ 2/9 Scores 08/20/2014 02/06/2014 02/06/2014 12/23/2011  PHQ - 2 Score 0 0 0 0  Exception Documentation Other- indicate reason in comment box - - -  Some encounter information is confidential and restricted. Go to Review Flowsheets activity to see all data.     Assessment:    Plan:

## 2019-11-13 NOTE — Patient Outreach (Addendum)
[facility]) Care Management Chronic Special Needs Program    11/13/2019  Name: [name], DOB: 09-Jul-1950  MRN: [id number]   [name] is enrolled in a chronic special needs plan for Diabetes.  RNCM received notification that client would like to have Ecologist.  Telephone call to client.  Unable to reach. HIPAA compliant message left.   Goals    .  Acknowledge receipt of Advanced Directive package     RN care manager will re-send client Advanced Directive package on 11/13/19 Contact your assigned RN care manager [phone]) to refer to social worker if assistance is needed with Advanced Directive.     . Client understands the importance of follow-up with providers by attending scheduled visits     Primary care provider visit: 06/19/19 Endocrinologist visit 04/12/19 Continue to maintain and keep follow up visits with your providers.      . Client will report no worsening of symptoms related to heart disease within the next  12 months     RN case manager will send client education article: Heart attack Call 911 for heart attack symptoms. Take your medications as prescribed. Keep scheduled appointments with your providers    . Client will verbalize knowledge of self management of Hypertension as evidences by BP reading of 140/90 or less; or as defined by provider     RN care manager will send client education article: Diabetes and blood pressure, High blood pressure: What you can do.  Take your medications as prescribed. Ask your provider, " What is my target blood pressure range." Follow up with your provider as recommended.      [name] HEMOGLOBIN A1C < 7.0     Diabetes self management actions:  Glucose monitoring per provider recommendation  Check feet daily  Visit provider every 3-6 months as directed  Hbg A1C level every 3-6 months.  Eye Exam yearly  Carbohydrate controlled meal planning  Taking diabetes medication as  prescribed by provider  Physical activity     . Maintain timely refills of diabetic medication as prescribed within the year .     Review of clients medical record indicates client maintains timely refills of diabetic medications Take your medications as prescribed Follow up with your doctor if you have questions Contact your assigned RN case manager [phone]) if your have difficulty obtaining your medications    . Obtain annual  Lipid Profile, LDL-C     Lipid profile completed 03/19/19 The goal for LDL is less than 70 mg/ dl as you are at high risk for complications try to avoid saturated fats, trans-fats, and eat more fiber. RN case manager will send client education article: Heart healthy diet.     . Obtain Annual Eye (retinal)  Exam      Eye exam 01/12/19 Plan to schedule your eye exam yearly     . Obtain Annual Foot Exam     Foot exam 03/19/19 Diabetes foot care - Check feet daily at home (look for skin color changes, cuts, sores or cracks in the skin, swelling of feet or ankles, ingrown or fungal toenails, corn or calluses). Report these findings to your doctor - Wash feet with soap and water, dry feet well especially between toes - Moisturize your feet but not between the toes - Always wear shoes that protect your whole feet.      . Obtain annual screen for micro albuminuria (urine) , nephropathy (kidney problems)     Micro Albuminuria 03/19/19  Attend yearly physicals and follow up visits with your providers and complete labs as recommended.     Illa Level Hemoglobin A1C at least 2 times per year     Hgb A1c completed 06/19/19,  03/19/19 Continue to keep your follow up appointments with your provider and have lab work completed as recommended.     . Visit Primary Care Provider or Endocrinologist at least 2 times per year        PLAN; RNCM will send client Advanced directive packet as requested.   [name] RN,BSN,CCM [facility] [phone]

## 2019-11-15 ENCOUNTER — Other Ambulatory Visit: Payer: Self-pay

## 2019-11-15 NOTE — Patient Outreach (Signed)
[facility]) Care Management Chronic Special Needs Program  11/15/2019  Name: [name] DOB: 1951/03/02  MRN: [id number]  [name] is enrolled in a chronic special needs plan for Diabetes. Chronic Care Management Coordinator telephoned client to review health risk assessment and to develop individualized care plan. HIPAA verified with client.  Introduced the chronic care management program, importance of client participation, and taking their care plan to all provider appointments and inpatient facilities.    Client reports he is not currently checking his blood sugars. He reports checking blood sugars in the past.  He states he has discussed this with his doctor and does not feel it is necessary for him to do this. Client reports his recent Hgb A1c was 7.7.  Client reports being overwhelmed with his recent retirement in April 2021.  He states he feels overwhelmed due to paperwork and constant calls from health care providers requiring / requesting ongoing information. RN discussed and offered [facility] care management social work follow up. Client verbally agreed. Client states he feels constantly fatigued and is not sure why.  He states he thinks it may be his medications. RN offered to refer client to pharmacist for medication follow up. Client verbally agreed.    Goals Addressed            This Visit's Progress   . COMPLETED:  Acknowledge receipt of Advanced Directive package       Client reports receiving  Advance Directive.       . Client understands the importance of follow-up with providers by attending scheduled visits   On track    Primary care provider visits:  10/11/19, 09/19/19 and 06/19/19 Endocrinologist visit 04/12/19 Continue to maintain and keep follow up visits with your providers.      . Client will report improved coping by in 6 months   On track    RN case manager will send client education article: Helping you cope RN case manager referred  client to social worker for follow up .     [name] Client will report no worsening of symptoms related to heart disease within the next  12 months   On track    Client denies any symptoms related to heart disease.  Notify your provider for symptoms of chest pain, sweating, nausea/ vomiting, irregular heartbeat, palpitations, rapid heart rate, shortness of breath or dizziness or fainting Call 911 for severe symptoms of chest pain or shortness of breath.  Continue to take your medications as prescribed.     . Client will verbalize contact with pharmacist to discuss ongoing feelings of fatigue in 6 monthss (pt-stated)   On track    Client voiced concerns regarding medications an ongoing feeling of fatigue. RN case manager reviewed clients medications in medical record.  RN case manager referred client to pharmacist for follow up.     . Client will verbalize contact with social worker to address feelings of being overwhelmed due to recent retirement in 6 months. (pt-stated)   On track    RN case manager allowed client to express feelings/ concerns regarding recent retirement and depression symptoms. RN case manager referred client to Education officer, museum for follow up     . Client will verbalize knowledge of self management of Hypertension as evidences by BP reading of 140/90 or less; or as defined by provider   On track    Client reports recent blood pressure of 138/82 Continue to check your blood pressure regularly.  Reviewed  clients blood pressure medication in medical record Continue to take your blood pressure medications as prescribed.  Continue to eat a low salt, heart healthy meal full of fruits, vegetables, whole grains, lean protein and limit fat and sugars.      [name] HEMOGLOBIN A1C < 7.0       Re-Diabetes self management actions:  Glucose monitoring per provider recommendation  Check feet daily  Visit provider every 3-6 months as directed  Hbg A1C level every 3-6 months.  Eye Exam  yearly  Carbohydrate controlled meal planning  Taking diabetes medication as prescribed by provider  Physical activity     . Maintain timely refills of diabetic medication as prescribed within the year .   On track    Client reports maintaining refills of his diabetic medication.  Medication review completed with client Continue to take your medications as prescribed.      . Obtain annual  Lipid Profile, LDL-C   On track    Lipid profile completed 03/19/19 Continue to take your statin medication as prescribed.  Client referred to pharmacist to discuss medication concerns.      . Obtain Annual Eye (retinal)  Exam    On track    Eye exam 01/12/19 Diabetes can affect your vision.  Plan to have a dilated eye exam every year     . Obtain Annual Foot Exam   On track    Foot exam 03/19/19 Diabetes can affect the nerves in your feet causing decreased feeling or numbness. If you experience these symptoms contact your doctor to discuss.  Please review quarterly Diabetic education mailings from [employer]     . Obtain annual screen for micro albuminuria (urine) , nephropathy (kidney problems)   On track    Micro Albuminuria 03/19/19 Diabetes can affect your kidneys.  It is important for your doctor to check you urine at least once a year. These tests show how your kidneys are working.  Continue to attend yearly physicals and follow up visits with your providers and complete labs as recommended.     Illa Level Hemoglobin A1C at least 2 times per year   On track    Hgb A1c completed 06/19/19,  03/19/19 Discussed with client importance of consistent follow up with provider for exam and lab work.  Discussed with client importance of obtaining a Hgb A1c every 3 to 6 months.     . Visit Primary Care Provider or Endocrinologist at least 2 times per year    On track    Primary care provider visits:  10/11/19, 09/19/19 and 06/19/19 Endocrinologist visit 04/12/19 Continue to maintain and keep  follow up visits with your providers.       ASSESSMENT;  Completed Depression screening results: PHQ3 - 3 and PHQ9 - 8.  Complaints of ongoing feelings of fatigue.   Client will benefit from referral to social worker and pharmacist. RN to change clients follow up TIER level to 2 due to feelings of being overwhelmed with recent life change (retirement) and to follow up on social work/ pharmacy referral.  RNCM advised client to notify MD of any changes in condition prior to scheduled appointment. Client advised to contact RNCM as needed and contact their HTA concierge for benefit questions.  RNCM provided client 24 hour HTA nurse advise line number [phone]  [facility] verified client aware of 911 services for urgent/ emergent needs.   Plan:  Send successful outreach letter with a copy of their individualized care plan, Send  individual care plan to provider and Send educational material  Chronic care management coordination will outreach in:  6 Months  Will refer client to:  Social Work and Pharmacy  [name] RN,BSN,CCM [place] Management [phone]

## 2019-11-15 NOTE — Patient Outreach (Signed)
  [place] [facility]) Care Management Chronic Special Needs Program    11/15/2019  Name: [name], DOB: [birth year]  MRN: [id number]   [name] is enrolled in a chronic special needs plan for Diabetes.  Telephone call to client for initial assessment follow up.  Unable to reach client. HIPAA compliant voice message left with call back phone number and return call request.   PLAN; RNCM will attempt 3rd telephone call to client within 2 weeks.   [name] RN,BSN,CCM [place] [facility] Management [phone]

## 2019-11-16 LAB — COLOGUARD
COLOGUARD: NEGATIVE
Cologuard: NEGATIVE

## 2019-11-16 NOTE — Patient Outreach (Signed)
Pharmacy referral from [name], RN to [email or web address] on 11/16/19 - [place]

## 2019-11-19 ENCOUNTER — Other Ambulatory Visit: Payer: Self-pay | Admitting: *Deleted

## 2019-11-19 NOTE — Patient Outreach (Signed)
[place] [facility]) Care Management  11/19/2019  RILYN UPSHAW 14-Jul-1950 [id number]   CSW received referral on 11/16/2019 for depression screening and attempted outreach today.  CSW was unable to reach pt and there was no voice mail offered.  CSW will send pt an Unsuccessful Outreach letter and try again in 3-4 business days per protocol.   [name], MSW, [place] Worker  [place] [phone]

## 2019-11-22 ENCOUNTER — Ambulatory Visit: Payer: Self-pay | Admitting: *Deleted

## 2019-11-22 ENCOUNTER — Other Ambulatory Visit: Payer: Self-pay | Admitting: *Deleted

## 2019-11-22 NOTE — Patient Outreach (Signed)
[place] [facility]) Care Management  11/22/2019  HENRY UTSEY 10-05-1950 [id number]   CSW attempted a second initial outreach call to pt today and was unsuccessful.  CSW called # listed for pt and when person answered call and CSW asked to speak to (patient) the person hung the phone up.  CSW will attempt a third outreach call within the 3 attempts in 10 business days protocol.   [name], MSW, [place] Worker  [place] [phone]

## 2019-11-23 ENCOUNTER — Encounter: Payer: Self-pay | Admitting: Internal Medicine

## 2019-12-03 ENCOUNTER — Ambulatory Visit: Payer: Self-pay | Admitting: *Deleted

## 2019-12-04 ENCOUNTER — Other Ambulatory Visit: Payer: Self-pay | Admitting: *Deleted

## 2019-12-04 NOTE — Patient Outreach (Signed)
[place] [facility]) Care Management  12/04/2019  [name] 11-15-1950 [id number]   CSW attempted a phone outreach call on 6/21 and was unsuccessful in reaching pt. No voicemail.  CSW will attempt a final outreach per policy in the next 30 days.   [name], MSW, [place] Worker  [place] [phone]

## 2019-12-17 ENCOUNTER — Other Ambulatory Visit: Payer: Self-pay | Admitting: Internal Medicine

## 2019-12-18 ENCOUNTER — Encounter: Payer: Self-pay | Admitting: Internal Medicine

## 2019-12-24 ENCOUNTER — Encounter: Payer: Self-pay | Admitting: *Deleted

## 2019-12-24 ENCOUNTER — Other Ambulatory Visit: Payer: Self-pay | Admitting: *Deleted

## 2019-12-24 NOTE — Patient Outreach (Signed)
[place] [facility]) Care Management  12/24/2019  [name] 1950/10/14 [id number]   CSW received a follow up call from pt on 12/21/2019.  Pt's identity confirmed.  Pt shared with CSW that he is continuing to struggle since retirement.  He is feeling isolated, lonely and states, "I feel like I often times have no purpose; nothing to look forward to".   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  Pt shared with CSW how he feels disconnect, forgotten by his long standing colleagues and even "feel abandoned".  He also shared that he had a major surgery (10- 12 hours) that has caused him to have some "dark times"  as well; pt verbalized it as PTSD.  Pt denies any current or history of SI/HI.  He was taking a RX for depression but found he was too sensitive to the meds.  CSW has encouraged pt to discuss possible other RX options with his PCP for consideration.  Pt is aware and well equipped with techniques such as mindfulness, tapping, etc. 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   Pt denies any current or history of SI or mental health concerns.  CSW discussed with pt how his life changes, COVID restrictions and his feeling alone/lonely and forgotten can feed off one another and worsen or cause feelings of sadness and depression.  CSW talked with pt about trying to focus on positive self-talk and reaching out to friends,family and coworkers to attempt to re-connect and engage.  CSW also reminded pt to call if needs arise prior to our follow up call.   CSW will mail pt resources and materials to review and consider and plan a follow up call in the next 2-3 weeks.  [name], MSW, [place] Worker   [place] [phone]

## 2019-12-25 ENCOUNTER — Encounter: Payer: Self-pay | Admitting: *Deleted

## 2020-01-02 ENCOUNTER — Other Ambulatory Visit: Payer: Self-pay | Admitting: *Deleted

## 2020-01-02 NOTE — Patient Outreach (Signed)
[place] [facility]) Care Management  01/02/2020  JILES GOYA 1951/06/01 [id number]    CSW made contact with pt today by phone who reports things are "plugging along".  He has not made much progress in the search for part time work because "I realized I don't wanna work".  He does want to connect with people more and is planning to look into the community services and programs CSW has provided.  Pt continues to feel isolated and alone, however shared that he had lunch with an old coworker yesterday.  CSW validated and talked with pt about the importance he has for "feeling loved and cared for" as well as wanting to help others; to "give back and have a meaningful" actions.  CSW talked further with him about opportunities for volunteer work as well as ways he might reach out to friends to reconnect.  Pt denies any SI; feels his depression is mainly around his feeling alone, bored and "missing human contact and there being nonone to come home to. Pt provided with # for the [employer] [employer] where they have a Chess club that pt might enjoy and he plans to call.   CSW offered support, encouragement and acknowledged his feelings/emotions.  Pt appears eager to try some avenues and yet is also worried that the pandemic and variant may cause [place] to retreat again in the months ahead.     CSW reminded pt to call if needs arise before follow up call planned for 2-3 weeks.    [name], MSW, [place] Worker  [place] [phone]

## 2020-01-16 ENCOUNTER — Other Ambulatory Visit (HOSPITAL_COMMUNITY): Payer: Self-pay | Admitting: Dentistry

## 2020-01-23 ENCOUNTER — Other Ambulatory Visit: Payer: Self-pay | Admitting: *Deleted

## 2020-01-23 ENCOUNTER — Ambulatory Visit: Payer: Self-pay | Admitting: *Deleted

## 2020-01-23 NOTE — Patient Outreach (Signed)
[place] [facility]) Care Management  01/23/2020  BIJON MINEER Oct 14, 1950 [id number]   CSW attempted a follow up call to pt today and was unable to reach. No voicemail.  CSW will attempt outreach again in 3-4 business days per policy.   [name], MSW, [place] Worker  [place] [phone]

## 2020-01-25 ENCOUNTER — Other Ambulatory Visit: Payer: Self-pay | Admitting: *Deleted

## 2020-01-28 NOTE — Patient Outreach (Signed)
[place] [facility]) Care Management  01/28/2020  LEONARDO MAKRIS 1950/07/02 [id number]  LATE ENTRY  CSW attempted to reach pt on 01/25/2020 and was unable.  CSW will send pt an unsuccessful outreach letter and try again per policy for 3 outreach attempts in 10 business days.   [name], MSW, [place] Worker  [place] [phone]

## 2020-01-29 ENCOUNTER — Other Ambulatory Visit: Payer: Self-pay | Admitting: Internal Medicine

## 2020-01-29 ENCOUNTER — Other Ambulatory Visit: Payer: Self-pay | Admitting: Cardiovascular Disease

## 2020-01-30 ENCOUNTER — Other Ambulatory Visit: Payer: Self-pay | Admitting: Cardiovascular Disease

## 2020-01-30 NOTE — Telephone Encounter (Signed)
1.Medication Requested:ALPRAZolam (XANAX) 1 MG tablet  2. Pharmacy (Name, [place], [place]):[facility] [place], [address]  3. On Med List: Yes   4. Last Visit with PCP: 4.29.21   5. Next visit date with PCP:10.7.21   Agent: Please be advised that RX refills may take up to 3 business days. We ask that you follow-up with your pharmacy.

## 2020-01-30 NOTE — Telephone Encounter (Signed)
Done erx 

## 2020-02-05 ENCOUNTER — Ambulatory Visit: Payer: Self-pay | Admitting: *Deleted

## 2020-02-05 ENCOUNTER — Other Ambulatory Visit: Payer: Self-pay | Admitting: *Deleted

## 2020-02-05 NOTE — Patient Outreach (Addendum)
[place] [facility]) Care Management  02/05/2020  [name] September 27, 1950 [id number]   CSW made contact with pt today. He reports; "I have a routine and it's going ok".  Pt shared with CSW that his routine involves going to a local coffee shop, going to exercise at the [facility] a few days a week, going to [employer] to read newpapers, get some books and some DVD movies to watch at home.  CSW commended pt for the actions he has taken to enhance/improve his quality of life. Pt is still feeling lonely and wanting to connect with people.  CSW encouraged pt to continue his activities and seeking opportunities for engaging with social activities and other people.    CSW completed PHQ9 Depression screening with pt-  Depression screen [facility] 2/9 02/05/2020 11/15/2019 08/20/2014 02/06/2014 02/06/2014  Decreased Interest 2 2 0 0 0  Down, Depressed, Hopeless 2 1 0 0 0  PHQ - 2 Score 4 3 0 0 0  Altered sleeping 1 1 - - -  Tired, decreased energy 2 2 - - -  Change in appetite 0 1 - - -  Feeling bad or failure about yourself  2 0 - - -  Trouble concentrating 1 1 - - -  Moving slowly or fidgety/restless 0 0 - - -  Suicidal thoughts 0 0 - - -  PHQ-9 Score 10 8 - - -  Difficult doing work/chores Not difficult at all Somewhat difficult - - -  Some encounter information is confidential and restricted. Go to Review Flowsheets activity to see all data.  Some recent data might be hidden   CSW encouraged pt to call if needs or concerns arise prior to next CSW outreach call.  Pt appreciative of support.    [name], MSW, [place] Worker  [place] [phone]

## 2020-02-15 ENCOUNTER — Ambulatory Visit (HOSPITAL_COMMUNITY): Payer: HMO | Attending: Cardiology

## 2020-02-15 ENCOUNTER — Other Ambulatory Visit: Payer: Self-pay

## 2020-02-15 ENCOUNTER — Telehealth: Payer: Self-pay

## 2020-02-15 ENCOUNTER — Other Ambulatory Visit (HOSPITAL_COMMUNITY): Payer: HMO

## 2020-02-15 DIAGNOSIS — I350 Nonrheumatic aortic (valve) stenosis: Secondary | ICD-10-CM | POA: Diagnosis not present

## 2020-02-15 DIAGNOSIS — I351 Nonrheumatic aortic (valve) insufficiency: Secondary | ICD-10-CM | POA: Diagnosis not present

## 2020-02-15 LAB — ECHOCARDIOGRAM COMPLETE
AR max vel: 2.45 cm2
AV Area VTI: 2.55 cm2
AV Area mean vel: 2.19 cm2
AV Mean grad: 11 mmHg
AV Peak grad: 19.4 mmHg
Ao pk vel: 2.2 m/s
Area-P 1/2: 3.85 cm2
P 1/2 time: 743 msec
S' Lateral: 3.3 cm

## 2020-02-15 NOTE — Telephone Encounter (Signed)
The patient has been notified of the result and verbalized understanding.  All questions (if any) were answered. Antonieta Iba, RN 02/15/2020 5:04 PM  Orders placed for echo in one year.

## 2020-02-15 NOTE — Telephone Encounter (Signed)
-----   Message from Josue Hector, MD sent at 02/15/2020  3:12 PM EDT ----- EF normal mild AS/AR stable f/u echo in a year

## 2020-02-21 ENCOUNTER — Other Ambulatory Visit: Payer: Self-pay | Admitting: *Deleted

## 2020-02-21 NOTE — Patient Outreach (Signed)
[place] [facility]) Care Management  02/21/2020  KALIN KYLER 1950-08-01 [id number]  CSW spoke with pt who reports he is doing about the same.  He continues to go the rec center to exercise and socialize, Art therapist for reading newspapers and checking out books and movies, and yet is still wanting to find more ways of connecting with others.  Pt shared that he has some questions about his Metformin and the dosing- CSW will ask [place] support team to contact pt to discuss further. CSW offered pt support and encouragement; reminded pt of info/resources discussed for community activities. CSW will follow up with pt again in the next 3 weeks.   [name], MSW, [place] Worker  [place] [phone]

## 2020-02-22 NOTE — Addendum Note (Signed)
Addended by: Deirdre Peer on: 02/22/2020 01:28 PM   Modules accepted: Orders

## 2020-02-25 ENCOUNTER — Telehealth: Payer: Self-pay | Admitting: Pharmacist

## 2020-02-25 DIAGNOSIS — R5382 Chronic fatigue, unspecified: Secondary | ICD-10-CM

## 2020-02-25 DIAGNOSIS — E119 Type 2 diabetes mellitus without complications: Secondary | ICD-10-CM

## 2020-02-25 NOTE — Progress Notes (Signed)
Chronic Care Management   Note  02/25/2020 Name: [name] MRN: [id number] DOB: 11/26/1950  [name] is a 69 y.o. year old male who is a primary care patient of Ryan Salina Real Cons, MD. I reached out to [name] by phone today in response to a referral sent by [name]'s PCP, Ryan Koch, MD.   [name] was given information about Chronic Care Management services today including:  1. CCM service includes personalized support from designated clinical staff supervised by his physician, including individualized plan of care and coordination with other care providers 2. 24/7 contact phone numbers for assistance for urgent and routine care needs. 3. Service will only be billed when office clinical staff spend 20 minutes or more in a month to coordinate care. 4. Only one practitioner may furnish and bill the service in a calendar month. 5. The patient may stop CCM services at any time (effective at the end of the month) by phone call to the office staff.   Patient agreed to services and verbal consent obtained.     Received referral from [facility] to discuss patient's questions about metformin dose.  Contacted patient via phone, he reports he is taking metformin ER 500 mg 3 tablets at breakfast. He is worried about his blood sugar control because he read that the ADA recommends A1c <7% and his last A1c was 7.7% in January. He said his insurance will cover Jardiance and wonders if he should start this. He also complains of generalized fatigue, he read that beta blockers can cause this. He also reports he is often anxious and is worried about cortisol.  Allergies  Allergen Reactions  . Testosterone Shortness Of Breath    Medication:Androgel Pt states trouble breathing at night  . Codeine Nausea And Vomiting  . Gabapentin     "drunk" feeling  . Ultram [Tramadol] Nausea And Vomiting  . Albuterol Other (See Comments)    Panic attack   Outpatient  Encounter Medications as of 02/25/2020  Medication Sig Note  . ALPRAZolam (XANAX) 1 MG tablet TAKE 1/2 TABLET BY MOUTH 4 TIMES A DAY   . aspirin EC 81 MG tablet Take 81 mg by mouth daily with lunch.   [name] azelastine (ASTELIN) 0.1 % nasal spray PLACE 2 SPRAYS INTO EACH NOSTRIL TWICE A DAY   . bisacodyl (DULCOLAX) 10 MG suppository Place 1 suppository (10 mg total) rectally daily as needed for moderate constipation.   . calcium carbonate (TUMS EX) 750 MG chewable tablet Chew 1 tablet by mouth daily as needed for heartburn.    . cholecalciferol (VITAMIN D) 1000 UNITS tablet Take 2,000 Units by mouth daily.   [name] EPINEPHRINE 0.3 mg/0.3 mL IJ SOAJ injection INJECT 0.3 MLS INTO THE MUSCLE ONCE AS DIRECTED   . fexofenadine (ALLEGRA) 180 MG tablet Take 90 mg by mouth 2 (two) times daily.    . fluticasone (FLONASE) 50 MCG/ACT nasal spray PLACE 2 SPRAYS INTO BOTH NOSTRILS DAILY.   . hydrochlorothiazide (HYDRODIURIL) 25 MG tablet Take 1 tablet (25 mg total) by mouth daily. Annual appt due in Oct must see provider for future refills   . metFORMIN (GLUCOPHAGE-XR) 500 MG 24 hr tablet Take 3 tablets (1,500 mg total) by mouth daily with breakfast.   . metoprolol tartrate (LOPRESSOR) 25 MG tablet TAKE 1 TABLET (25 MG TOTAL) BY MOUTH 3 (THREE) TIMES DAILY.   . montelukast (SINGULAIR) 10 MG tablet TAKE 1/2 TABLET BY MOUTH 2 TIMES DAILY.   [name]  Multiple Vitamin (MULTIVITAMIN WITH MINERALS) TABS Take 1 tablet by mouth daily.   [name] omega-3 acid ethyl esters (LOVAZA) 1 g capsule Take 1 capsule (1 g total) by mouth 2 (two) times daily. Annual appt due in Oct must see provider for future refills   . OVER THE COUNTER MEDICATION Apply 1 application topically daily as needed (for acne). Oxy 10 vanishing cream   . polyethylene glycol powder (GLYCOLAX/MIRALAX) powder MIX 17G WITH LIQUID AND DRINK BY MOUTH DAILY.   [name] PREVIDENT 5000 DRY MOUTH 1.1 % GEL dental gel APPLY THIN RIBBON AND BRUSH FOR 2 MINUTES DAILY IN PLACE OF CONVENTIONAL  TOOTHPASTE   . rosuvastatin (CRESTOR) 10 MG tablet TAKE 1 TABLET BY MOUTH ONCE DAILY   . thiamine (VITAMIN B-1) 100 MG tablet Take 100 mg by mouth 2 (two) times daily at 10 AM and 5 PM.    . TURMERIC CURCUMIN PO Take by mouth.   . vitamin B-12 (CYANOCOBALAMIN) 500 MCG tablet Take 500-1,000 mcg by mouth daily.    . [DISCONTINUED] oxyCODONE (OXY IR/ROXICODONE) 5 MG immediate release tablet Take 1 tablet (5 mg total) by mouth every 4 (four) hours as needed. 12/05/2012: Pt states he stop too strong   No facility-administered encounter medications on file as of 02/25/2020.    Lab Results  Component Value Date/Time   HGBA1C 7.7 (H) 06/19/2019 01:57 PM   HGBA1C 8.4 (H) 03/19/2019 03:58 PM   Kidney Function Lab Results  Component Value Date/Time   CREATININE 1.08 03/19/2019 03:58 PM   CREATININE 0.80 06/21/2018 12:02 PM   CREATININE 0.98 04/03/2015 09:57 AM   CREATININE 0.94 03/29/2014 01:53 PM   GFR 68.44 03/19/2019 03:58 PM   GFRNONAA >60 06/21/2018 11:43 AM   GFRNONAA >89 06/09/2012 03:15 PM   GFRAA >60 06/21/2018 11:43 AM   GFRAA >89 06/09/2012 03:15 PM   K 3.8 03/19/2019 03:58 PM   K 4.4 06/21/2018 12:02 PM   A/P: We discussed optimal dose of metformin is 2000 mg/day, he is currently taking 1500 mg/day so there is room to titrate up before adding medications. I recommended he keep his CPE appointment on 03/20/2020 so that we can recheck lab work and physical exam before making medication changes based on old labs. I transferred him to scheduling team so he can be rescheduled with another provider while his PCP is out on leave.  Recommendations: -continue current doses of medications until CPE appointment 03/20/20 -if A1c is still >7%, first titrate metformin to 1000 mg BID and consider adding Jardiance 10 mg daily -discuss anxiety/fatigue with PCP and consider counseling for coping with stress

## 2020-02-25 NOTE — Patient Outreach (Signed)
Received a Pharmacy Referral.  Mr. [facility] Primary Care Physician uses [facility] for their pharmacy.  I have sent this referral to Charlene Brooke at [facility].

## 2020-02-26 ENCOUNTER — Other Ambulatory Visit: Payer: Self-pay | Admitting: Internal Medicine

## 2020-02-26 NOTE — Progress Notes (Signed)
CARDIOLOGY CONSULT NOTE       Patient ID: [name] MRN: [id number] DOB/AGE: Aug 09, 1950 69 y.o.  Primary Physician: Hoyt Koch, MD Primary Cardiologist: Johnsie Cancel    HPI:  69 y.o.  status post repair of type A ascending dissection 02/19/12 PVT  with Hemi shield graft used to replace ascending aorta and hemi-arch reconstruction, resuspension of aortic valve Likely brachial plexus injury with axillary artery cannulation on right side. Had staged  AAA repair by Dr Kellie Simmering 10/2012 CT done 06/13/19 and followed by VVS/CVTS reviewed  IMPRESSION: 1. Stable findings consistent with prior surgical repair of ascending thoracic aortic dissection. Stable small focal pseudoaneurysm or penetrating ulcer is seen involving the proximal portion of the graft repair that currently measures 3.5 x 1.2 cm. 2. Stable type B dissection is seen involving the descending thoracic aorta with increased thrombosis of the false lumen. Maximum aneurysmal dilatation of 4.6 cm is noted which is not significantly changed compared to prior exam. 3. Status post aortobifemoral graft repair of abdominal aortic aneurysm. The graft and its limbs are widely patent. 4. Dissection flap extends into the proximal portion of the left renal artery which appears to be predominantly supplied by false lumen. 5. Cholelithiasis is noted without inflammation. 6. Emphysema and aortic atherosclerosis.  Finally retired from [employer] to [employer] 3x/week to work out Had picture of his grand daughter Clyde Canterbury with him today she is 2 and He is expecting another soon  Still has a sense of loneliness since retired Optometrist ? Pseudo dementia  Considering starting SSRI  Echo 02/15/20 reviewed:  EF 60-65% mild AR mild AS mean gradient 11 mmHg   ROS All other systems reviewed and negative except as noted above  Past Medical History:  Diagnosis Date  . AAA (abdominal aortic aneurysm) ([place])   . ALLERGIC RHINITIS    . Anemia    post op, treated /w Fe  . Anxiety   . Arthritis    OA, hands, - thumbs- specifically  . Brachial plexopathy    double crush injury with median nerve damage on the right dominant hand.   [name] COPD (chronic obstructive pulmonary disease) ([place])    also states he has been told that he has slight emphysema   . Dissecting aortic aneurysm, thoracic ([place]) 02/20/2012   s/p emergent surgical repair  . DM2 (diabetes mellitus, type 2) ([place])   . DVT (deep venous thrombosis) ([facility])    coumadin- x3 months , post op  . ERECTILE DYSFUNCTION, ORGANIC   . GERD (gastroesophageal reflux disease)    related to diet & anxiety   . HYPERLIPIDEMIA   . Hypertension    pt. followwed by Dr. Johnsie Cancel  . MIGRAINE HEADACHE    induced from physical , emotional stress , but sometimes occur randomly, also experiences BPPV  . OBSTRUCTIVE SLEEP APNEA 02/2008 sleep study   moderate & central- not able to tolerate CPAP   . Thyroid nodule 04/21/2015   2.3 cm on right, incidental finding on 03/2015 CT chest - f/u US ordered  . Unspecified vitamin D deficiency     Family History  Problem Relation Age of Onset  . Diabetes Mother   . Stroke Mother   . Dementia Mother   . Deep vein thrombosis Father        PE  . Varicose Veins Father   . Heart disease Maternal Grandmother   . Heart disease Maternal Uncle   . Heart disease Paternal Uncle   . Heart  disease Cousin   . Lung cancer Cousin        2 cousins    Social History   Socioeconomic History  . Marital status: Divorced    Spouse name: Not on file  . Number of children: 2  . Years of education: acad. deg.  . Highest education level: Not on file  Occupational History  . Occupation: internal med resident    Employer: [employer]  Tobacco Use  . Smoking status: Former Smoker    Packs/day: 0.10    Types: Cigarettes    Quit date: 02/18/2012    Years since quitting: 8.0  . Smokeless tobacco: Never Used  . Tobacco comment: 10 cigs/day, quit 02/2012 after  TAA emergent repair  Vaping Use  . Vaping Use: Never used  Substance and Sexual Activity  . Alcohol use: Yes    Alcohol/week: 0.0 standard drinks    Comment: shot of Tequila every day   . Drug use: No  . Sexual activity: Not on file  Other Topics Concern  . Not on file  Social History Narrative   Patient is single and lives at home alone.    Arboriculturist.   Patient works at [employer] full time.   Right handed.   Caffeine one cup of coffee daily.    Social Determinants of Health   Financial Resource Strain:   . Difficulty of Paying Living Expenses: Not on file  Food Insecurity: No Food Insecurity  . Worried About Charity fundraiser in the Last Year: Never true  . Ran Out of Food in the Last Year: Never true  Transportation Needs: No Transportation Needs  . Lack of Transportation (Medical): No  . Lack of Transportation (Non-Medical): No  Physical Activity:   . Days of Exercise per Week: Not on file  . Minutes of Exercise per Session: Not on file  Stress:   . Feeling of Stress : Not on file  Social Connections:   . Frequency of Communication with Friends and Family: Not on file  . Frequency of Social Gatherings with Friends and Family: Not on file  . Attends Religious Services: Not on file  . Active Member of Clubs or Organizations: Not on file  . Attends Archivist Meetings: Not on file  . Marital Status: Not on file  Intimate Partner Violence:   . Fear of Current or Ex-Partner: Not on file  . Emotionally Abused: Not on file  . Physically Abused: Not on file  . Sexually Abused: Not on file    Past Surgical History:  Procedure Laterality Date  . ABDOMINAL AORTIC ANEURYSM REPAIR N/A 11/01/2012   Procedure: ANEURYSM ABDOMINAL AORTIC REPAIR;  Surgeon: Mal Misty, MD;  Location: [facility];  Service: Vascular;  Laterality: N/A;  Resection and Grafting of Abdominal Aortic Aneurysm,(AORTA BI ILIAC)  . Nasal fx without repair    . THORACIC AORTIC ANEURYSM  REPAIR  02/20/2012   Procedure: THORACIC ASCENDING ANEURYSM REPAIR (AAA);  Surgeon: Ivin Poot, MD;  Location: [facility];  Service: Open Heart Surgery;  Laterality: N/A;      Current Outpatient Medications:  .  ALPRAZolam (XANAX) 1 MG tablet, TAKE 1/2 TABLET BY MOUTH 4 TIMES A DAY, Disp: 60 tablet, Rfl: 1 .  aspirin EC 81 MG tablet, Take 81 mg by mouth daily with lunch., Disp: , Rfl:  .  azelastine (ASTELIN) 0.1 % nasal spray, PLACE 2 SPRAYS INTO EACH NOSTRIL TWICE A DAY, Disp: 30 mL, Rfl: 0 .  bisacodyl (DULCOLAX) 10 MG suppository, Place 1 suppository (10 mg total) rectally daily as needed for moderate constipation., Disp: 12 suppository, Rfl: 0 .  calcium carbonate (TUMS EX) 750 MG chewable tablet, Chew 1 tablet by mouth daily as needed for heartburn. , Disp: , Rfl:  .  cholecalciferol (VITAMIN D) 1000 UNITS tablet, Take 2,000 Units by mouth daily., Disp: , Rfl:  .  ciclopirox (PENLAC) 8 % solution, Apply topically at bedtime. Apply over nail and surrounding skin. Apply daily over previous coat. After seven (7) days, may remove with alcohol and continue cycle., Disp: 6.6 mL, Rfl: 4 .  EPINEPHRINE 0.3 mg/0.3 mL IJ SOAJ injection, INJECT 0.3 MLS INTO THE MUSCLE ONCE AS DIRECTED, Disp: 2 each, Rfl: 3 .  escitalopram (LEXAPRO) 10 MG tablet, Start with a half tablet in AM for 14 days before going on to 10 mg dose., Disp: 30 tablet, Rfl: 5 .  fexofenadine (ALLEGRA) 180 MG tablet, Take 90 mg by mouth 2 (two) times daily. , Disp: , Rfl:  .  fluticasone (FLONASE) 50 MCG/ACT nasal spray, PLACE 2 SPRAYS INTO BOTH NOSTRILS DAILY., Disp: 16 g, Rfl: 2 .  hydrochlorothiazide (HYDRODIURIL) 25 MG tablet, Take 1 tablet (25 mg total) by mouth daily. Annual appt due in Oct must see provider for future refills, Disp: 90 tablet, Rfl: 1 .  metFORMIN (GLUCOPHAGE-XR) 500 MG 24 hr tablet, Take 3 tablets (1,500 mg total) by mouth daily with breakfast., Disp: 270 tablet, Rfl: 3 .  metoprolol tartrate (LOPRESSOR) 25 MG  tablet, TAKE 1 TABLET (25 MG TOTAL) BY MOUTH 3 (THREE) TIMES DAILY., Disp: 270 tablet, Rfl: 1 .  montelukast (SINGULAIR) 10 MG tablet, TAKE 1/2 TABLET BY MOUTH 2 TIMES DAILY., Disp: 90 tablet, Rfl: 1 .  Multiple Vitamin (MULTIVITAMIN WITH MINERALS) TABS, Take 1 tablet by mouth daily., Disp: , Rfl:  .  omega-3 acid ethyl esters (LOVAZA) 1 g capsule, Take 1 capsule (1 g total) by mouth 2 (two) times daily. Annual appt due in Oct must see provider for future refills, Disp: 180 capsule, Rfl: 1 .  OVER THE COUNTER MEDICATION, Apply 1 application topically daily as needed (for acne). Oxy 10 vanishing cream, Disp: , Rfl:  .  polyethylene glycol powder (GLYCOLAX/MIRALAX) powder, MIX 17G WITH LIQUID AND DRINK BY MOUTH DAILY., Disp: 527 g, Rfl: 5 .  PREVIDENT 5000 DRY MOUTH 1.1 % GEL dental gel, APPLY THIN RIBBON AND BRUSH FOR 2 MINUTES DAILY IN PLACE OF CONVENTIONAL TOOTHPASTE, Disp: , Rfl: 99 .  rosuvastatin (CRESTOR) 10 MG tablet, TAKE 1 TABLET BY MOUTH ONCE DAILY, Disp: 90 tablet, Rfl: 1 .  thiamine (VITAMIN B-1) 100 MG tablet, Take 100 mg by mouth 2 (two) times daily at 10 AM and 5 PM. , Disp: , Rfl:  .  TURMERIC CURCUMIN PO, Take by mouth., Disp: , Rfl:  .  vitamin B-12 (CYANOCOBALAMIN) 500 MCG tablet, Take 500-1,000 mcg by mouth daily. , Disp: , Rfl:     Physical Exam:  Affect appropriate Healthy:  appears stated age 89: normal Neck supple with no adenopathy JVP normal left bruits no thyromegaly Lungs clear with no wheezing and good diaphragmatic motion Heart:  S1/S2 SEM and AR murmur, no rub, gallop or click PMI normal post sternotomy  Abdomen: benighn, BS positve, no tenderness, no AAA no bruit.  No HSM or HJR post AAA repair  Distal pulses intact with no bruits No edema Neuro non-focal Skin warm and dry No muscular weakness   Labs:  Lab Results  Component Value Date   WBC 6.3 03/19/2019   HGB 15.2 03/19/2019   HCT 45.3 03/19/2019   MCV 94.4 03/19/2019   PLT 157.0  03/19/2019   No results for input(s): NA, K, CL, CO2, BUN, CREATININE, CALCIUM, PROT, BILITOT, ALKPHOS, ALT, AST, GLUCOSE in the last 168 hours.  Invalid input(s): LABALBU Lab Results  Component Value Date   TROPONINI <0.30 04/02/2012    Lab Results  Component Value Date   CHOL 131 03/19/2019   CHOL 120 03/14/2018   CHOL 131 02/25/2017   Lab Results  Component Value Date   HDL 36.30 (L) 03/19/2019   HDL 33.40 (L) 03/14/2018   HDL 45.40 02/25/2017   Lab Results  Component Value Date   LDLCALC 61 03/19/2019   LDLCALC 68 03/14/2018   LDLCALC 65 02/25/2017   Lab Results  Component Value Date   TRIG 165.0 (H) 03/19/2019   TRIG 94.0 03/14/2018   TRIG 102.0 02/25/2017   Lab Results  Component Value Date   CHOLHDL 4 03/19/2019   CHOLHDL 4 03/14/2018   CHOLHDL 3 02/25/2017   Lab Results  Component Value Date   LDLDIRECT 114 (H) 03/21/2010      Radiology: ECHOCARDIOGRAM COMPLETE  Result Date: 02/15/2020    ECHOCARDIOGRAM REPORT   Patient Name:   SOLLY DERASMO Date of Exam: 02/15/2020 Medical Rec #:  [id number]        Height:       74.0 in Accession #:    [id number]       Weight:       228.4 lb Date of Birth:  1950/09/16       BSA:          2.300 m Patient Age:    59 years         BP:           142/70 mmHg Patient Gender: M                HR:           55 bpm. Exam Location:  [facility] Procedure: 2D Echo, 3D Echo, Cardiac Doppler and Color Doppler Indications:    I35 Aortic stenosis.  History:        Patient has prior history of Echocardiogram examinations, most                 recent 02/14/2019. CAD, DVT, Signs/Symptoms:Syncope; Risk                 Factors:Sleep Apnea, Dyslipidemia, Former Smoker, Diabetes and                 Hypertension. AAA. S/p dissection repair with aortic valve                 resuspension.  Sonographer:    Jessee Avers, RDCS Referring Phys: [place]  1. Left ventricular ejection fraction, by estimation, is 60 to 65%. The left  ventricle has normal function. The left ventricle has no regional wall motion abnormalities. The left ventricular internal cavity size was mildly dilated. There is mild left ventricular hypertrophy. Left ventricular diastolic parameters are consistent with Grade II diastolic dysfunction (pseudonormalization).  2. Right ventricular systolic function is normal. The right ventricular size is normal. There is normal pulmonary artery systolic pressure.  3. The mitral valve is normal in structure. Trivial mitral valve regurgitation. No evidence of mitral stenosis.  4. The aortic valve is tricuspid.  Aortic valve regurgitation is mild. Mild aortic valve stenosis (mean gradient 11 mmHg).  5. Aortic dilatation noted. There is mild dilatation of the aortic root measuring 41 mm.  6. The inferior vena cava is normal in size with greater than 50% respiratory variability, suggesting right atrial pressure of 3 mmHg. Comparison(s): 02/14/19 EF 60-65%. AV 1mmHg mean PG, 52mmHg peak PG. FINDINGS  Left Ventricle: Left ventricular ejection fraction, by estimation, is 60 to 65%. The left ventricle has normal function. The left ventricle has no regional wall motion abnormalities. The left ventricular internal cavity size was mildly dilated. There is  mild left ventricular hypertrophy. Left ventricular diastolic parameters are consistent with Grade II diastolic dysfunction (pseudonormalization). Right Ventricle: The right ventricular size is normal.Right ventricular systolic function is normal. There is normal pulmonary artery systolic pressure. The tricuspid regurgitant velocity is 2.24 m/s, and with an assumed right atrial pressure of 3 mmHg, the estimated right ventricular systolic pressure is 40.1 mmHg. Left Atrium: Left atrial size was normal in size. Right Atrium: Right atrial size was normal in size. Pericardium: There is no evidence of pericardial effusion. Mitral Valve: The mitral valve is normal in structure. Normal mobility of  the mitral valve leaflets. Mild mitral annular calcification. Trivial mitral valve regurgitation. No evidence of mitral valve stenosis. Tricuspid Valve: The tricuspid valve is normal in structure. Tricuspid valve regurgitation is trivial. No evidence of tricuspid stenosis. Aortic Valve: The aortic valve is tricuspid. Aortic valve regurgitation is mild. Aortic regurgitation PHT measures 743 msec. Mild aortic stenosis is present. Aortic valve mean gradient measures 11.0 mmHg. Aortic valve peak gradient measures 19.4 mmHg. Aortic valve area, by VTI measures 2.55 cm. Pulmonic Valve: The pulmonic valve was normal in structure. Pulmonic valve regurgitation is trivial. No evidence of pulmonic stenosis. Aorta: Aortic dilatation noted. There is mild dilatation of the aortic root measuring 41 mm. Venous: The inferior vena cava is normal in size with greater than 50% respiratory variability, suggesting right atrial pressure of 3 mmHg. IAS/Shunts: No atrial level shunt detected by color flow Doppler.  LEFT VENTRICLE PLAX 2D LVIDd:         5.70 cm  Diastology LVIDs:         3.30 cm  LV e' lateral:   7.72 cm/s LV PW:         1.20 cm  LV E/e' lateral: 13.2 LV IVS:        1.20 cm  LV e' medial:    6.85 cm/s LVOT diam:     2.40 cm  LV E/e' medial:  14.9 LV SV:         121 LV SV Index:   53 LVOT Area:     4.52 cm                          3D Volume EF:                         3D EF:        66 %                         LV EDV:       192 ml                         LV ESV:       65 ml  LV SV:        127 ml RIGHT VENTRICLE RV Basal diam:  3.90 cm RV S prime:     12.40 cm/s TAPSE (M-mode): 1.6 cm RVSP:           23.1 mmHg LEFT ATRIUM             Index       RIGHT ATRIUM           Index LA diam:        4.00 cm 1.74 cm/m  RA Pressure: 3.00 mmHg LA Vol (A2C):   49.5 ml 21.52 ml/m RA Area:     17.40 cm LA Vol (A4C):   48.4 ml 21.05 ml/m RA Volume:   43.40 ml  18.87 ml/m LA Biplane Vol: 49.1 ml 21.35 ml/m  AORTIC  VALVE AV Area (Vmax):    2.45 cm AV Area (Vmean):   2.19 cm AV Area (VTI):     2.55 cm AV Vmax:           220.00 cm/s AV Vmean:          153.000 cm/s AV VTI:            0.475 m AV Peak Grad:      19.4 mmHg AV Mean Grad:      11.0 mmHg LVOT Vmax:         119.00 cm/s LVOT Vmean:        74.000 cm/s LVOT VTI:          0.268 m LVOT/AV VTI ratio: 0.56 AI PHT:            743 msec  AORTA Ao Root diam: 4.10 cm Ao Asc diam:  3.60 cm MITRAL VALVE                TRICUSPID VALVE                             TR Peak grad:   20.1 mmHg                             TR Vmax:        224.00 cm/s MV E velocity: 102.00 cm/s  Estimated RAP:  3.00 mmHg MV A velocity: 85.70 cm/s   RVSP:           23.1 mmHg MV E/A ratio:  1.19                             SHUNTS                             Systemic VTI:  0.27 m                             Systemic Diam: 2.40 cm Kirk Ruths MD Electronically signed by Kirk Ruths MD Signature Date/Time: 02/15/2020/2:45:38 PM    Final     EKG: SR rate 56 normal 06/24/18 08/14/19 SR rate 73 nonspecific ST changes    ASSESSMENT AND PLAN:    1. Type A Dissection: grafted root and resuspended valve stable by CT mild AS/AR f/u echo September 2022   2. AAA:  F/U VVS false lumen stable by CT 06/13/19   3. DM:  Discussed  low carb diet.  Target hemoglobin A1c is 6.5 or less.  Continue current medications.  4. HLD:  LDL at goal continue statin   5. Bruit:  Plaque on stenosis on duplex 03/13/19 ? Referred murmur from AV   6. Cognitive:  Some memory issues ? Migrainous aura f/u neuro  ? Start SSRI    F/U in 6 months  Echo September 2022   Signed: Jenkins Rouge 03/11/2020, 3:37 PM

## 2020-02-26 NOTE — Telephone Encounter (Signed)
Done erx 

## 2020-02-28 ENCOUNTER — Ambulatory Visit (INDEPENDENT_AMBULATORY_CARE_PROVIDER_SITE_OTHER): Payer: HMO | Admitting: Podiatry

## 2020-02-28 ENCOUNTER — Other Ambulatory Visit: Payer: Self-pay | Admitting: Podiatry

## 2020-02-28 ENCOUNTER — Other Ambulatory Visit: Payer: Self-pay

## 2020-02-28 DIAGNOSIS — E119 Type 2 diabetes mellitus without complications: Secondary | ICD-10-CM | POA: Diagnosis not present

## 2020-02-28 DIAGNOSIS — B351 Tinea unguium: Secondary | ICD-10-CM | POA: Diagnosis not present

## 2020-02-28 MED ORDER — CICLOPIROX 8 % EX SOLN
Freq: Every day | CUTANEOUS | 4 refills | Status: DC
Start: 2020-02-28 — End: 2020-09-12

## 2020-02-28 MED ORDER — CICLOPIROX 8 % EX SOLN: Freq: Every day | CUTANEOUS | 4 refills | Status: DC

## 2020-02-28 NOTE — Patient Instructions (Signed)
Ciclopirox nail solution What is this medicine? CICLOPIROX (sye kloe PEER ox) NAIL SOLUTION is an antifungal medicine. It used to treat fungal infections of the nails. This medicine may be used for other purposes; ask your health care provider or pharmacist if you have questions. COMMON BRAND NAME(S): [id number], [place] What should I tell my health care provider before I take this medicine? They need to know if you have any of these conditions:  diabetes mellitus  history of seizures  HIV infection  immune system problems or organ transplant  large areas of burned or damaged skin  peripheral vascular disease or poor circulation  taking corticosteroid medication (including steroid inhalers, cream, or lotion)  an unusual or allergic reaction to ciclopirox, isopropyl alcohol, other medicines, foods, dyes, or preservatives  pregnant or trying to get pregnant  breast-feeding How should I use this medicine? This medicine is for external use only. Follow the directions that come with this medicine exactly. Wash and dry your hands before use. Avoid contact with the eyes, mouth or nose. If you do get this medicine in your eyes, rinse out with plenty of cool tap water. Contact your doctor or health care professional if eye irritation occurs. Use at regular intervals. Do not use your medicine more often than directed. Finish the full course prescribed by your doctor or health care professional even if you think you are better. Do not stop using except on your doctor's advice. Talk to your pediatrician regarding the use of this medicine in children. While this medicine may be prescribed for children as young as 12 years for selected conditions, precautions do apply. Overdosage: If you think you have taken too much of this medicine contact a poison control center or emergency room at once. NOTE: This medicine is only for you. Do not share this medicine with others. What if I miss a dose? If you miss a  dose, use it as soon as you can. If it is almost time for your next dose, use only that dose. Do not use double or extra doses. What may interact with this medicine? Interactions are not expected. Do not use any other skin products without telling your doctor or health care professional. This list may not describe all possible interactions. Give your health care provider a list of all the medicines, herbs, non-prescription drugs, or dietary supplements you use. Also tell them if you smoke, drink alcohol, or use illegal drugs. Some items may interact with your medicine. What should I watch for while using this medicine? Tell your doctor or health care professional if your symptoms get worse. Four to six months of treatment may be needed for the nail(s) to improve. Some people may not achieve a complete cure or clearing of the nails by this time. Tell your doctor or health care professional if you develop sores or blisters that do not heal properly. If your nail infection returns after stopping using this product, contact your doctor or health care professional. What side effects may I notice from receiving this medicine? Side effects that you should report to your doctor or health care professional as soon as possible:  allergic reactions like skin rash, itching or hives, swelling of the face, lips, or tongue  severe irritation, redness, burning, blistering, peeling, swelling, oozing Side effects that usually do not require medical attention (report to your doctor or health care professional if they continue or are bothersome):  mild reddening of the skin  nail discoloration  temporary burning or mild   stinging at the site of application This list may not describe all possible side effects. Call your doctor for medical advice about side effects. You may report side effects to FDA at 1-800-FDA-1088. Where should I keep my medicine? Keep out of the reach of children. Store at room temperature  between 15 and 30 degrees C (59 and 86 degrees F). Do not freeze. Protect from light by storing the bottle in the carton after every use. This medicine is flammable. Keep away from heat and flame. Throw away any unused medicine after the expiration date. NOTE: This sheet is a summary. It may not cover all possible information. If you have questions about this medicine, talk to your doctor, pharmacist, or health care provider.  2020 Elsevier/Gold Standard (2007-09-04 16:49:20)  

## 2020-03-02 NOTE — Progress Notes (Addendum)
Subjective:   Patient ID: [name], male   DOB: 69 y.o.   MRN: [id number]   HPI 69 year old male presents the office with concerns of toenail fungus particularly left 5th toenail as the nails become somewhat thickened and discolored.  Is been self treating this for last 10 days with over-the-counter medication.  This has been getting worse for last couple months.  No pain in the nail he denies any redness or drainage.  He has no other concerns today.  Last A1c was 7.7 on June 19, 2019   Review of Systems  All other systems reviewed and are negative.  Past Medical History:  Diagnosis Date  . AAA (abdominal aortic aneurysm) ([place])   . ALLERGIC RHINITIS   . Anemia    post op, treated /w Fe  . Anxiety   . Arthritis    OA, hands, - thumbs- specifically  . Brachial plexopathy    double crush injury with median nerve damage on the right dominant hand.   [name] COPD (chronic obstructive pulmonary disease) ([place])    also states he has been told that he has slight emphysema   . Dissecting aortic aneurysm, thoracic ([place]) 02/20/2012   s/p emergent surgical repair  . DM2 (diabetes mellitus, type 2) ([place])   . DVT (deep venous thrombosis) ([facility])    coumadin- x3 months , post op  . ERECTILE DYSFUNCTION, ORGANIC   . GERD (gastroesophageal reflux disease)    related to diet & anxiety   . HYPERLIPIDEMIA   . Hypertension    pt. followwed by Dr. Johnsie Cancel  . MIGRAINE HEADACHE    induced from physical , emotional stress , but sometimes occur randomly, also experiences BPPV  . OBSTRUCTIVE SLEEP APNEA 02/2008 sleep study   moderate & central- not able to tolerate CPAP   . Thyroid nodule 04/21/2015   2.3 cm on right, incidental finding on 03/2015 CT chest - f/u US ordered  . Unspecified vitamin D deficiency     Past Surgical History:  Procedure Laterality Date  . ABDOMINAL AORTIC ANEURYSM REPAIR N/A 11/01/2012   Procedure: ANEURYSM ABDOMINAL AORTIC REPAIR;  Surgeon: Mal Misty, MD;  Location: [facility]  OR;  Service: Vascular;  Laterality: N/A;  Resection and Grafting of Abdominal Aortic Aneurysm,(AORTA BI ILIAC)  . Nasal fx without repair    . THORACIC AORTIC ANEURYSM REPAIR  02/20/2012   Procedure: THORACIC ASCENDING ANEURYSM REPAIR (AAA);  Surgeon: Ivin Poot, MD;  Location: [facility];  Service: Open Heart Surgery;  Laterality: N/A;     Current Outpatient Medications:  .  ALPRAZolam (XANAX) 1 MG tablet, TAKE 1/2 TABLET BY MOUTH 4 TIMES A DAY, Disp: 60 tablet, Rfl: 1 .  aspirin EC 81 MG tablet, Take 81 mg by mouth daily with lunch., Disp: , Rfl:  .  azelastine (ASTELIN) 0.1 % nasal spray, PLACE 2 SPRAYS INTO EACH NOSTRIL TWICE A DAY, Disp: 30 mL, Rfl: 0 .  bisacodyl (DULCOLAX) 10 MG suppository, Place 1 suppository (10 mg total) rectally daily as needed for moderate constipation., Disp: 12 suppository, Rfl: 0 .  calcium carbonate (TUMS EX) 750 MG chewable tablet, Chew 1 tablet by mouth daily as needed for heartburn. , Disp: , Rfl:  .  cholecalciferol (VITAMIN D) 1000 UNITS tablet, Take 2,000 Units by mouth daily., Disp: , Rfl:  .  ciclopirox (PENLAC) 8 % solution, Apply topically at bedtime. Apply over nail and surrounding skin. Apply daily over previous coat. After seven (7) days, may remove with  alcohol and continue cycle., Disp: 6.6 mL, Rfl: 4 .  EPINEPHRINE 0.3 mg/0.3 mL IJ SOAJ injection, INJECT 0.3 MLS INTO THE MUSCLE ONCE AS DIRECTED, Disp: 2 each, Rfl: 3 .  fexofenadine (ALLEGRA) 180 MG tablet, Take 90 mg by mouth 2 (two) times daily. , Disp: , Rfl:  .  fluticasone (FLONASE) 50 MCG/ACT nasal spray, PLACE 2 SPRAYS INTO BOTH NOSTRILS DAILY., Disp: 16 g, Rfl: 2 .  hydrochlorothiazide (HYDRODIURIL) 25 MG tablet, Take 1 tablet (25 mg total) by mouth daily. Annual appt due in Oct must see provider for future refills, Disp: 90 tablet, Rfl: 1 .  metFORMIN (GLUCOPHAGE-XR) 500 MG 24 hr tablet, Take 3 tablets (1,500 mg total) by mouth daily with breakfast., Disp: 270 tablet, Rfl: 3 .  metoprolol  tartrate (LOPRESSOR) 25 MG tablet, TAKE 1 TABLET (25 MG TOTAL) BY MOUTH 3 (THREE) TIMES DAILY., Disp: 270 tablet, Rfl: 1 .  montelukast (SINGULAIR) 10 MG tablet, TAKE 1/2 TABLET BY MOUTH 2 TIMES DAILY., Disp: 90 tablet, Rfl: 1 .  Multiple Vitamin (MULTIVITAMIN WITH MINERALS) TABS, Take 1 tablet by mouth daily., Disp: , Rfl:  .  omega-3 acid ethyl esters (LOVAZA) 1 g capsule, Take 1 capsule (1 g total) by mouth 2 (two) times daily. Annual appt due in Oct must see provider for future refills, Disp: 180 capsule, Rfl: 1 .  OVER THE COUNTER MEDICATION, Apply 1 application topically daily as needed (for acne). Oxy 10 vanishing cream, Disp: , Rfl:  .  polyethylene glycol powder (GLYCOLAX/MIRALAX) powder, MIX 17G WITH LIQUID AND DRINK BY MOUTH DAILY., Disp: 527 g, Rfl: 5 .  PREVIDENT 5000 DRY MOUTH 1.1 % GEL dental gel, APPLY THIN RIBBON AND BRUSH FOR 2 MINUTES DAILY IN PLACE OF CONVENTIONAL TOOTHPASTE, Disp: , Rfl: 99 .  rosuvastatin (CRESTOR) 10 MG tablet, TAKE 1 TABLET BY MOUTH ONCE DAILY, Disp: 90 tablet, Rfl: 1 .  thiamine (VITAMIN B-1) 100 MG tablet, Take 100 mg by mouth 2 (two) times daily at 10 AM and 5 PM. , Disp: , Rfl:  .  TURMERIC CURCUMIN PO, Take by mouth., Disp: , Rfl:  .  vitamin B-12 (CYANOCOBALAMIN) 500 MCG tablet, Take 500-1,000 mcg by mouth daily. , Disp: , Rfl:   Allergies  Allergen Reactions  . Testosterone Shortness Of Breath    Medication:Androgel Pt states trouble breathing at night  . Codeine Nausea And Vomiting  . Gabapentin     "drunk" feeling  . Ultram [Tramadol] Nausea And Vomiting  . Albuterol Other (See Comments)    Panic attack         Objective:  Physical Exam  General: AAO x3, NAD  Dermatological: Left fifth toenail appears to be hypertrophic, dystrophic with white discoloration.  No hyperpigmented changes.  No edema, erythema any pain.  There is no open lesions.  Vascular: Dorsalis Pedis artery and Posterior Tibial artery pedal pulses are 2/4 bilateral  with immedate capillary fill time. There is no pain with calf compression, swelling, warmth, erythema.   Neruologic: Sensation decreased with Semmes Weinstein monofilament  Musculoskeletal: No gross boney pedal deformities bilateral. No pain, crepitus, or limitation noted with foot and ankle range of motion bilateral. Muscular strength 5/5 in all groups tested bilateral.  Gait: Unassisted, Nonantalgic.       Assessment:   69 year old male with onychomycosis, type 2 diabetes with neuropathy    Plan:  -Treatment options discussed including all alternatives, risks, and complications -Etiology of symptoms were discussed -Debride the nails any complications or bleeding.  Prescribed Penlac and discussed side effects as well success rates. -Discussed daily foot inspection  Return in about 3 months (around 05/29/2020).  Trula Slade DPM

## 2020-03-03 ENCOUNTER — Other Ambulatory Visit: Payer: Self-pay

## 2020-03-03 NOTE — Patient Outreach (Signed)
  [facility]) Care Management Chronic Special Needs Program    03/03/2020  Name: [name], DOB: [birth year]  MRN: [id number]   [name] is enrolled in a chronic special needs plan for Diabetes.  Message received from [place] [facility], care management assistant to contact client regarding pharmacy concern.  Telephone call to client's home number. Unable to reach due to message stating "Network difficulty."  Attempted call to clients mobile number. Unable to reach client or leave voice mail message. Message states "voice mail box has not been set up."   PLAN; RNCM will attempt 2nd contact to client in 1 week.   [name] RN,BSN,CCM [facility] [phone]

## 2020-03-03 NOTE — Patient Outreach (Signed)
  [facility]) Care Management Chronic Special Needs Program    03/03/2020  Name: [name], DOB: March 08, 1951  MRN: [id number]   [name] is enrolled in a chronic special needs plan for Diabetes. Returned call to client. HIPAA verified. Client states he had a consultation with the pharmacist regarding his medications. Denies any further needs at this time. Advised client to contact her if additional assistance is needed in the future.   PLAN:  RNCM will follow up with client at next scheduled outreach.  [name] RN,BSN,CCM [facility] [phone]

## 2020-03-04 ENCOUNTER — Ambulatory Visit: Payer: Self-pay

## 2020-03-04 ENCOUNTER — Other Ambulatory Visit: Payer: Self-pay | Admitting: *Deleted

## 2020-03-04 NOTE — Patient Outreach (Signed)
[facility]) Care Management  03/04/2020  [name] 02/14/1951 [id number]   CSW spoke with pt by phone today who reports he is continuing to get out and seek "people interactions"; [place], [place] expresses an interest in finding more people connections; admitting his lonely.  CSW encouraged pt to continue to seek opportunities for socialization as well as ways to distract/redirect his negative feelings.  Discussed journaling, meditation/mindfulness and other techniques.   CSW reminded pt to call if needs arise prior to follow up call.   [name], MSW, [place] Worker  [place] [phone]

## 2020-03-05 ENCOUNTER — Ambulatory Visit: Payer: 59 | Admitting: Neurology

## 2020-03-05 ENCOUNTER — Encounter: Payer: Self-pay | Admitting: Podiatry

## 2020-03-05 ENCOUNTER — Other Ambulatory Visit: Payer: Self-pay

## 2020-03-05 ENCOUNTER — Encounter: Payer: Self-pay | Admitting: Neurology

## 2020-03-05 VITALS — BP 125/63 | HR 64 | Ht 74.0 in | Wt 228.0 lb

## 2020-03-05 DIAGNOSIS — F332 Major depressive disorder, recurrent severe without psychotic features: Secondary | ICD-10-CM | POA: Diagnosis not present

## 2020-03-05 DIAGNOSIS — I6789 Other cerebrovascular disease: Secondary | ICD-10-CM | POA: Diagnosis not present

## 2020-03-05 DIAGNOSIS — G608 Other hereditary and idiopathic neuropathies: Secondary | ICD-10-CM | POA: Diagnosis not present

## 2020-03-05 DIAGNOSIS — R4189 Other symptoms and signs involving cognitive functions and awareness: Secondary | ICD-10-CM | POA: Diagnosis not present

## 2020-03-05 MED ORDER — ESCITALOPRAM OXALATE 10 MG PO TABS: ORAL_TABLET | ORAL | 5 refills | Status: DC

## 2020-03-05 NOTE — Progress Notes (Signed)
[facility]  Provider:  Larey Seat, M D  Referring Provider: Hoyt Koch, * Primary Care Physician:  Hoyt Koch, MD  Chief Complaint  Patient presents with  . Follow-up    Room 11. 1 year memory follow up.   . Memory Loss    MOCA: 28/30    HPI:  [name] is a 69 y.o. male  - here for memory concerns-   [name] has followed here for peripheral nerve injuries as well as for a complex form of sleep apnea that was partially obstructive and partially central, and he could not use CPAP. All this culminated after open heart surgery.He is meanwhile 68 years of age and will turn 25 this November. He worries about dementia ever since heart surgery- " pump brain" He has difficulties with computer access, remembering password, user names, etc. 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 Forgot his daughter's birthday, has been misspelling words that didn't give him trouble before.  He cannot recognize faces- but recalls names (Anosognosia) ! He recently mistook his cell phone for the TV control. He feels fatigued, not refreshed or restored.  Dr. Asa Lente prescribed Xanax and this has calmed him, helps with sleep. But he is worried about his cognitive function. CPAP failed him, chin strap very uncomfortable -he wants to learn about dental or ENT treatments. His OSA is resolved , his last sleep test could not detect any OSA - 10-28-2017  , sill some Anxiety, COPD, Hypertension, and Migraine Headache. He has been reporting muscle cramping, morning stiffness- " I feel like hell" - even more fatigue, cognitive decline, he became very lonely over the time of the pandemic, but he also has recently become a grandfather again and his family life is happy.  He does endorse a high score on the geriatric depression score 11 out of 15 points of clinical depression.  He states that his neuropathy has progressed and his feet are profoundly numb and  affect his gait stability.  Also, he reports inability to recall password, to reboot and fix his computer, has again lifted the remote to answer the phone- he is concerned.   I believe he has pseudodementia. He is too worried and anxious to partake in activities that would give him pleasure. He is indecisive, and has less attention to detail, is hyper- focussed on what could go wrong.     02-10-2016 - he was seen here upon referral by Dr. Nils Pyle after reporting confusional episodes. Formerly seen here  from Dr. Sharlet Salina for Brachial neuropathy,08-26-14 Periods are episodes during which he feels confused, slightly disoriented to  Time frame, he feels that he has trouble differentiating if he is dreaming, if he  is awake, is in a memory . He reports also that his behaviors have changed but he cannot really explain those. #1 his leaving cabinet doors open which is not his habit usually. He misplaces things frequently and gets panicky when not able to locate them. Also there is a difficulty with abstraction. He mentioned that he hurt somebody referred to a saying ; "I've been there,  done that and I brought the T-shirt " he could not understand what the "bought the T-shirt "part was about. He also has noticed that he sometimes gets off the elevator in the hospital and is not sure if he has to go left or right- it takes him a while to reorient himself. He also mentioned an event  when he tried to and was preparing to mow his lawn, he had to fill the lawnmower with gasoline and held the petroleum can cap  In  one hand -he forgot what it was used for- until he realized that this was cap that closed the petroleum can. He has also trouble with recognize him some word sometimes in mid sentence sometimes word that he is just reading sometimes words he wants to use and speak. He is less comfortbale with change and new situations, any divergence form his routine.   Amnestic episodes. Family history positive for a  mother with  dementia in her 97 's. He spoke to his sister, and she has some symptoms , too. He is weaning off amitriptyline.     Last visit note 8- 2014 CD.  [name] is a 69 year old Caucasian gentleman, who  underwent surgery for an aortic dissection in September 2013. Following the surgery he had first some numbness and weakness involving the right upper extremity. The patient had experienced shortly at the improvement since that time when he reported that he still had some residual numbness of the dominant hand as well as some noticeable weakness no she had no discomfort associated no pain. I referred the patient for I nerve conduction study with EMG which was performed on 12-20-12 by Dr. Floyde Parkins. The patient's blood conduction studies and EMGs confirm a proximal right median  neuropathy of the brachial plexus, located lately at the ligament of Struthers. The changes were already chronic and moderate to severe. A cervical radiculopathy and impingement of the exiting nerves was not noted it is likely that the patient suffered a so-called double crush syndrome doing has had surgery. It is important to note that the ulnar and radial nerve is of the right extremity are unaffected.  [name]  reports some tingling and he extends his right upper extremity which is a new phenomenon. It is slow not troubling him very much but it is noticeable. The area of dysesthesia affects the skin dermatome. It extends from the medial biceps through the antebrachial region into fingers of his right hand. He had questions about using amitriptyline to treat the tingling numbness. It will  also help with sleep initiation and amitriptyline is used in patients with neck pain as well as neuropathy. His neuropathy seems also to be related to the surgery and possibly the anesthesia perhaps the loss of blood flow.  The patient reports that his sudden loss of awareness spells that he had suffered around the time of the  surgery has not occurred at least for the last four to six weeks. I have asked him to try to avoid decongestants. An EEG performed on 06-1612 was normal.  Review of Systems: Out of a complete 14 system review, the patient complains of only the following symptoms, and all other reviewed systems are negative. See above .   Geriatric depression Score - 11/ 15 points.   Montreal Cognitive Assessment  03/05/2020  Visuospatial/ Executive (0/5) 5  Naming (0/3) 3  Attention: Read list of digits (0/2) 2  Attention: Read list of letters (0/1) 1  Attention: Serial 7 subtraction starting at 100 (0/3) 3  Language: Repeat phrase (0/2) 1  Language : Fluency (0/1) 1  Abstraction (0/2) 2  Delayed Recall (0/5) 4  Orientation (0/6) 6  Total 28  Some encounter information is confidential and restricted. Go to Review Flowsheets activity to see all data.     Social History   Socioeconomic  History  . Marital status: Divorced    Spouse name: Not on file  . Number of children: 2  . Years of education: acad. deg.  . Highest education level: Not on file  Occupational History  . Occupation: internal med resident    Employer: [employer]  Tobacco Use  . Smoking status: Former Smoker    Packs/day: 0.10    Types: Cigarettes    Quit date: 02/18/2012    Years since quitting: 8.0  . Smokeless tobacco: Never Used  . Tobacco comment: 10 cigs/day, quit 02/2012 after TAA emergent repair  Vaping Use  . Vaping Use: Never used  Substance and Sexual Activity  . Alcohol use: Yes    Alcohol/week: 0.0 standard drinks    Comment: shot of Tequila every day   . Drug use: No  . Sexual activity: Not on file  Other Topics Concern  . Not on file  Social History Narrative   Patient is single and lives at home alone.    Arboriculturist.   Patient works at [employer] full time.   Right handed.   Caffeine one cup of coffee daily.    Social Determinants of Health   Financial Resource Strain:   . Difficulty of  Paying Living Expenses: Not on file  Food Insecurity: No Food Insecurity  . Worried About Charity fundraiser in the Last Year: Never true  . Ran Out of Food in the Last Year: Never true  Transportation Needs: No Transportation Needs  . Lack of Transportation (Medical): No  . Lack of Transportation (Non-Medical): No  Physical Activity:   . Days of Exercise per Week: Not on file  . Minutes of Exercise per Session: Not on file  Stress:   . Feeling of Stress : Not on file  Social Connections:   . Frequency of Communication with Friends and Family: Not on file  . Frequency of Social Gatherings with Friends and Family: Not on file  . Attends Religious Services: Not on file  . Active Member of Clubs or Organizations: Not on file  . Attends Archivist Meetings: Not on file  . Marital Status: Not on file  Intimate Partner Violence:   . Fear of Current or Ex-Partner: Not on file  . Emotionally Abused: Not on file  . Physically Abused: Not on file  . Sexually Abused: Not on file    Family History  Problem Relation Age of Onset  . Diabetes Mother   . Stroke Mother   . Dementia Mother   . Deep vein thrombosis Father        PE  . Varicose Veins Father   . Heart disease Maternal Grandmother   . Heart disease Maternal Uncle   . Heart disease Paternal Uncle   . Heart disease Cousin   . Lung cancer Cousin        2 cousins    Past Medical History:  Diagnosis Date  . AAA (abdominal aortic aneurysm) ([place])   . ALLERGIC RHINITIS   . Anemia    post op, treated /w Fe  . Anxiety   . Arthritis    OA, hands, - thumbs- specifically  . Brachial plexopathy    double crush injury with median nerve damage on the right dominant hand.   [name] COPD (chronic obstructive pulmonary disease) ([place])    also states he has been told that he has slight emphysema   . Dissecting aortic aneurysm, thoracic ([place]) 02/20/2012   s/p emergent  surgical repair  . DM2 (diabetes mellitus, type 2) ([place])   . DVT  (deep venous thrombosis) ([facility])    coumadin- x3 months , post op  . ERECTILE DYSFUNCTION, ORGANIC   . GERD (gastroesophageal reflux disease)    related to diet & anxiety   . HYPERLIPIDEMIA   . Hypertension    pt. followwed by Dr. Johnsie Cancel  . MIGRAINE HEADACHE    induced from physical , emotional stress , but sometimes occur randomly, also experiences BPPV  . OBSTRUCTIVE SLEEP APNEA 02/2008 sleep study   moderate & central- not able to tolerate CPAP   . Thyroid nodule 04/21/2015   2.3 cm on right, incidental finding on 03/2015 CT chest - f/u US ordered  . Unspecified vitamin D deficiency     Past Surgical History:  Procedure Laterality Date  . ABDOMINAL AORTIC ANEURYSM REPAIR N/A 11/01/2012   Procedure: ANEURYSM ABDOMINAL AORTIC REPAIR;  Surgeon: Mal Misty, MD;  Location: [facility] OR;  Service: Vascular;  Laterality: N/A;  Resection and Grafting of Abdominal Aortic Aneurysm,(AORTA BI ILIAC)  . Nasal fx without repair    . THORACIC AORTIC ANEURYSM REPAIR  02/20/2012   Procedure: THORACIC ASCENDING ANEURYSM REPAIR (AAA);  Surgeon: Ivin Poot, MD;  Location: [facility];  Service: Open Heart Surgery;  Laterality: N/A;    Current Outpatient Medications  Medication Sig Dispense Refill  . ALPRAZolam (XANAX) 1 MG tablet TAKE 1/2 TABLET BY MOUTH 4 TIMES A DAY 60 tablet 1  . aspirin EC 81 MG tablet Take 81 mg by mouth daily with lunch.    [name] azelastine (ASTELIN) 0.1 % nasal spray PLACE 2 SPRAYS INTO EACH NOSTRIL TWICE A DAY 30 mL 0  . bisacodyl (DULCOLAX) 10 MG suppository Place 1 suppository (10 mg total) rectally daily as needed for moderate constipation. 12 suppository 0  . calcium carbonate (TUMS EX) 750 MG chewable tablet Chew 1 tablet by mouth daily as needed for heartburn.     . cholecalciferol (VITAMIN D) 1000 UNITS tablet Take 2,000 Units by mouth daily.    . ciclopirox (PENLAC) 8 % solution Apply topically at bedtime. Apply over nail and surrounding skin. Apply daily over previous coat. After  seven (7) days, may remove with alcohol and continue cycle. 6.6 mL 4  . EPINEPHRINE 0.3 mg/0.3 mL IJ SOAJ injection INJECT 0.3 MLS INTO THE MUSCLE ONCE AS DIRECTED 2 each 3  . fexofenadine (ALLEGRA) 180 MG tablet Take 90 mg by mouth 2 (two) times daily.     . fluticasone (FLONASE) 50 MCG/ACT nasal spray PLACE 2 SPRAYS INTO BOTH NOSTRILS DAILY. 16 g 2  . hydrochlorothiazide (HYDRODIURIL) 25 MG tablet Take 1 tablet (25 mg total) by mouth daily. Annual appt due in Oct must see provider for future refills 90 tablet 1  . metFORMIN (GLUCOPHAGE-XR) 500 MG 24 hr tablet Take 3 tablets (1,500 mg total) by mouth daily with breakfast. 270 tablet 3  . metoprolol tartrate (LOPRESSOR) 25 MG tablet TAKE 1 TABLET (25 MG TOTAL) BY MOUTH 3 (THREE) TIMES DAILY. 270 tablet 1  . montelukast (SINGULAIR) 10 MG tablet TAKE 1/2 TABLET BY MOUTH 2 TIMES DAILY. 90 tablet 1  . Multiple Vitamin (MULTIVITAMIN WITH MINERALS) TABS Take 1 tablet by mouth daily.    [name] omega-3 acid ethyl esters (LOVAZA) 1 g capsule Take 1 capsule (1 g total) by mouth 2 (two) times daily. Annual appt due in Oct must see provider for future refills 180 capsule 1  . OVER THE COUNTER  MEDICATION Apply 1 application topically daily as needed (for acne). Oxy 10 vanishing cream    . polyethylene glycol powder (GLYCOLAX/MIRALAX) powder MIX 17G WITH LIQUID AND DRINK BY MOUTH DAILY. 527 g 5  . PREVIDENT 5000 DRY MOUTH 1.1 % GEL dental gel APPLY THIN RIBBON AND BRUSH FOR 2 MINUTES DAILY IN PLACE OF CONVENTIONAL TOOTHPASTE  99  . rosuvastatin (CRESTOR) 10 MG tablet TAKE 1 TABLET BY MOUTH ONCE DAILY 90 tablet 1  . thiamine (VITAMIN B-1) 100 MG tablet Take 100 mg by mouth 2 (two) times daily at 10 AM and 5 PM.     . TURMERIC CURCUMIN PO Take by mouth.    . vitamin B-12 (CYANOCOBALAMIN) 500 MCG tablet Take 500-1,000 mcg by mouth daily.      No current facility-administered medications for this visit.    Allergies as of 03/05/2020 - Review Complete 03/05/2020   Allergen Reaction Noted  . Testosterone Shortness Of Breath 02/16/2012  . Codeine Nausea And Vomiting 10/24/2012  . Gabapentin  03/29/2013  . Ultram [tramadol] Nausea And Vomiting 11/16/2012  . Albuterol Other (See Comments) 05/23/2012    Vitals: BP 125/63   Pulse 64   Ht 6\' 2"  (1.88 m)   Wt 228 lb (103.4 kg)   BMI 29.27 kg/m  Last Weight:  Wt Readings from Last 1 Encounters:  03/05/20 228 lb (103.4 kg)   Last Height:   Ht Readings from Last 1 Encounters:  03/05/20 6\' 2"  (1.88 m)    February  2016 Dr. Prescott Gum Note  ; HE:NIDPOEU returns for 3 month followup status post repair of Stanford type A ascending dissection with straight graft and resuspension of aortic valve September 2013.the false lumen of the distal aorta remains patent but is not enlarged and last diameter of the descending thoracic aorta by CTA last summer was 4.0 cm. after repair of his acute dissection the patient underwent a repair of a AAA May 2014 by Dr. Kellie Simmering.The patient had a echocardiogram 3 months ago which showed normal LV function, normal aortic valve function without AS or AI. 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 He has stopped smoking. His blood pressure is well-controlled. Today he complains of a persistent numbness and some weakness in his right hand right upper extremity. He also complains of "neurocognitive"problems with abnormal thoughts which combine dreams and hallucinations with reality.he has difficulty with short  term memory and short-term reading comprehension. He had one episode of transient blurred vision after working in the yard recently. Recently he had bilateral carotid Dopplers which showed no significant stenosis. He is in a sinus rhythm. He takes aspirin daily.   General: The patient is awake, alert and appears not in acute distress. The patient is well groomed. Head: Normocephalic, atraumatic. Neck is supple.  Mallampati 3 , neck circumference: 18 Cardiovascular:  Regular rate and rhythm , without  murmurs Skin:  Without evidence of edema, or rash Trunk: BMI is 29.3 kg/m2 - elevated and patient has a stooped posture.   Neurologic exam : The patient is awake and alert, oriented to place and time.  Memory subjective  described as disturbed - There is a normal attention span & concentration ability.  Speech is fluent without dysarthria, dysphonia , but he reports subjective  aphasia. Mood and affect are appropriate.  Cranial nerves: Pupils are equal and briskly reactive to light. Funduscopic exam without  evidence of pallor or edema.  Extraocular movements  in vertical and horizontal planes intact and without nystagmus. Visual fields by finger perimetry are intact.Hearing to finger rub intact.  Facial sensation intact to fine touch. Facial motor strength is symmetric and tongue and uvula move midline.  Motor exam:  Normal tone and normal muscle bulk. The patient has a weaker grip strength on the right than left and a week of pinch strength as well as the thenar atrophy. Since his brachial plexus injury his right arm has become smaller than his left and his right shoulder is slightly droopier than his left.  He does have facial symmetry however. Sensory:  Fine touch, pinprick and vibration were tested in all extremities. Proprioception is normal in the upper extremeties, the feet and toes lost fine touch and vibration. He lost temperature sensation .  Coordination: Rapid alternating movements in the fingers/hands is tested and normal. Finger-to-nose maneuver normal  He had initial dysmetria on the left, but not when the maneuver was repeated.    Gait and station: Patient walks without assistive device and is able and assisted stool climb up to the exam table. Strength within normal limits.  Stance is stable and normal. Tandem gait is fragmented, he has overcome Vestibulitis - took vestibular rehab, he turns  with 4 Steps, which are unfragmented. Romberg testing is negative  Deep tendon reflexes: in the upper and lower extremities are intact ! . Babinski maneuver response was deferred.    Assessment:  After physical and neurologic examination, review of laboratory studies, imaging, neurophysiology testing and pre-existing records, assessment :  [name] has well recovered from his brachial plexus injury as documented on his right arm in the past, he had bouts of ataxia and risk of falling before his vertigo was found to be was related to vestibular inflammation during allergy season.  Neuropathy- numb feet.     He just confirmed with me that he went through vestibular rehabilitation which has improved his balance. Here on testing there is still a mild swaying noted and these tandem gait was impaired.  Cognitive impairment was again not found on test.  The patient underwent a [place]ral cognitive assessment test is 28 out of 30 points.   Plan: Treatment plan and additional workup will be reviewed under Problem List.  Brief trance like stage , confusion,  Dizziness in episodic fashion, word recall delay. He is extremely worried and depressed-   PSEUDODEMENTIA- MOCA was 28/30 points - excellent.  Please refer to depression counseling- and possible medication treatment.     Electronically signed by: Larey Seat, MD 03/05/2020 4:01 PM  [facility] Board certified by The [employer] of Sleep Medicine and Diplomate of the [employer] of Sleep Medicine. Board certified In Neurology through the [place], Fellow of the [employer] of Neurology. Medical Director of [facility].

## 2020-03-05 NOTE — Patient Instructions (Signed)
Persistent Depressive Disorder, Adult Persistent depressive disorder (PDD) is a mental health condition that causes symptoms of low-level depression for 2 years or longer. It may also be called long-term (chronic) depression or dysthymia. PDD may include episodes of more severe depression that last for about 2 weeks (major depressive disorder or MDD).  PDD can affect the way you think, feel, and sleep.  This condition may also affect your relationships. You may be more likely to get sick if you have PDD.  What are the causes? The exact cause of this condition is not known. PDD is most likely caused by a combination of things, which may include:  Genetic factors. These are traits that are passed along from parent to child.  Individual factors.   Your personality, your behavior, and the way you handle your thoughts and feelings may contribute to PDD. This includes personality traits and behaviors learned from others.  Physical factors, such as: ? Differences in the part of your brain that controls emotion.  ? This part of your brain may be different than it is in people who do not have PDD. ? Long-term (chronic) medical or psychiatric illnesses.  Social factors. Traumatic experiences or major life changes may play a role in the development of PDD. What increases the risk? This condition is more likely to develop in women. The following factors may make you more likely to develop PDD:  A family history of depression.  Abnormally low levels of certain brain chemicals.  Traumatic events , especially abuse or the loss of a parent.  Being under a lot of stress, or long-term stress, especially from upsetting life experiences or losses.  A history of: ? Chronic physical illness. ? Other mental health disorders. ? Substance abuse.  Poor living conditions.  Experiencing social exclusion or discrimination on a regular basis. What are the signs or symptoms? Symptoms of this condition occur  for most of the day, and may include:  Fatigue or low energy.  Eating too much or too little.  Sleeping too much or too little.  Restlessness or agitation.  Feelings of hopelessness.  Feeling worthless or guilty.  Anxiety.  Poor concentration or difficulty making decisions.  Low self-esteem.  Negative outlook.  Inability to have fun or experience pleasure.  Social withdrawal.  Unexplained physical complaints.  Irritability.  Aggressive behavior or anger. How is this diagnosed? This condition may be diagnosed based on:  Your symptoms.  Your medical history, including your mental health history. This may involve tests to evaluate your mental health. You may be asked questions about your lifestyle, including any drug and alcohol use, and how long you have had symptoms of PDD.  A physical exam.  Blood tests to rule out other conditions. You may be diagnosed with PDD if you have had a depressed mood for 2 years or longer, as well as other symptoms of depression. How is this treated? This condition is usually treated by mental health professionals, such as psychologists, psychiatrists, and clinical social workers. You may need more than one type of treatment. Treatment may include:  Psychotherapy. This is also called talk therapy or counseling. Types of psychotherapy include: ? Cognitive behavioral therapy (CBT). This type of therapy teaches you to recognize unhealthy feelings, thoughts, and behaviors, and replace them with positive thoughts and actions. ? Interpersonal therapy (IPT). This helps you to improve the way you relate to and communicate with others. ? Family therapy. This treatment includes members of your family.  Medicine to treat  anxiety and depression, or to help you control certain emotions and behaviors.  Lifestyle changes, such as: ? Limiting alcohol and drug use. ? Exercising regularly. ? Getting plenty of sleep. ? Making healthy eating  choices. ? Spending more time outdoors.  Follow these instructions at home: Activity  Return to your normal activities as told by your health care provider.  Exercise regularly and spend time outdoors as told by your health care provider. General instructions  Take over-the-counter and prescription medicines only as told by your health care provider.  Do not drink alcohol. If you drink alcohol, limit your alcohol intake to no more than 1 drink a day for nonpregnant women and 2 drinks a day for men. One drink equals 12 oz of beer, 5 oz of wine, or 1 oz of hard liquor. Alcohol can affect any antidepressant medicines you are taking. Talk to your health care provider about your alcohol use.  Eat a healthy diet and get plenty of sleep.  Find activities that you enjoy doing, and make time to do them.  Consider joining a support group. Your health care provider may be able to recommend a support group.  Keep all follow-up visits as told by your health care provider. This is important. Where to find more information [employer] on Mental Illness  www.nami.org [place] National Institute of Mental Health  https://carter.com/ National Suicide Prevention Lifeline  1-800-273-TALK 251 833 3669). This is free, 24-hour help. Contact a health care provider if:  Your symptoms get worse.  You develop new symptoms.  You have trouble sleeping or doing your daily activities. Get help right away if:  You self-harm.  You have serious thoughts about hurting yourself or others.  You see, hear, taste, smell, or feel things that are not present (hallucinate). This information is not intended to replace advice given to you by your health care provider. Make sure you discuss any questions you have with your health care provider. Document Revised: 05/13/2017 Document Reviewed: 12/13/2015 Elsevier Patient Education  [place].

## 2020-03-05 NOTE — Addendum Note (Signed)
Addended by: Larey Seat on: 03/05/2020 04:34 PM   Modules accepted: Orders

## 2020-03-06 ENCOUNTER — Encounter: Payer: Self-pay | Admitting: Neurology

## 2020-03-11 ENCOUNTER — Encounter: Payer: Self-pay | Admitting: Cardiovascular Disease

## 2020-03-11 ENCOUNTER — Ambulatory Visit: Payer: HMO | Admitting: Cardiovascular Disease

## 2020-03-11 ENCOUNTER — Other Ambulatory Visit: Payer: Self-pay

## 2020-03-11 VITALS — BP 122/68 | HR 69 | Ht 74.0 in | Wt 227.0 lb

## 2020-03-11 DIAGNOSIS — I7100 Dissection of unspecified site of aorta: Secondary | ICD-10-CM

## 2020-03-11 NOTE — Patient Instructions (Addendum)
Medication Instructions:  *If you need a refill on your cardiac medications before your next appointment, please call your pharmacy*  Lab Work: If you have labs (blood work) drawn today and your tests are completely normal, you will receive your results only by: . MyChart Message (if you have MyChart) OR . A paper copy in the mail If you have any lab test that is abnormal or we need to change your treatment, we will call you to review the results.  Follow-Up: At [facility], you and your health needs are our priority.  As part of our continuing mission to provide you with exceptional heart care, we have created designated Provider Care Teams.  These Care Teams include your primary Cardiologist (physician) and Advanced Practice Providers (APPs -  Physician Assistants and Nurse Practitioners) who all work together to provide you with the care you need, when you need it.  We recommend signing up for the patient portal called "MyChart".  Sign up information is provided on this After Visit Summary.  MyChart is used to connect with patients for Virtual Visits (Telemedicine).  Patients are able to view lab/test results, encounter notes, upcoming appointments, etc.  Non-urgent messages can be sent to your provider as well.   To learn more about what you can do with MyChart, go to [email or web address]://[email or web address].    Your next appointment:   6 month(s)  The format for your next appointment:   In Person  Provider:   You may see Dr. Nishan or one of the following Advanced Practice Providers on your designated Care Team:    Lori Gerhardt, NP  Laura Ingold, NP  Jill McDaniel, NP   

## 2020-03-20 ENCOUNTER — Ambulatory Visit (INDEPENDENT_AMBULATORY_CARE_PROVIDER_SITE_OTHER): Payer: HMO | Admitting: Internal Medicine

## 2020-03-20 ENCOUNTER — Encounter: Payer: Self-pay | Admitting: Internal Medicine

## 2020-03-20 ENCOUNTER — Other Ambulatory Visit: Payer: Self-pay | Admitting: *Deleted

## 2020-03-20 ENCOUNTER — Other Ambulatory Visit: Payer: Self-pay

## 2020-03-20 ENCOUNTER — Encounter: Payer: HMO | Admitting: Internal Medicine

## 2020-03-20 VITALS — BP 136/74 | HR 58 | Temp 98.1°F | Resp 16 | Ht 75.25 in | Wt 227.0 lb

## 2020-03-20 DIAGNOSIS — F332 Major depressive disorder, recurrent severe without psychotic features: Secondary | ICD-10-CM

## 2020-03-20 DIAGNOSIS — E559 Vitamin D deficiency, unspecified: Secondary | ICD-10-CM | POA: Diagnosis not present

## 2020-03-20 DIAGNOSIS — Z0000 Encounter for general adult medical examination without abnormal findings: Secondary | ICD-10-CM | POA: Diagnosis not present

## 2020-03-20 DIAGNOSIS — G6289 Other specified polyneuropathies: Secondary | ICD-10-CM | POA: Diagnosis not present

## 2020-03-20 DIAGNOSIS — E119 Type 2 diabetes mellitus without complications: Secondary | ICD-10-CM

## 2020-03-20 DIAGNOSIS — E1169 Type 2 diabetes mellitus with other specified complication: Secondary | ICD-10-CM

## 2020-03-20 DIAGNOSIS — L989 Disorder of the skin and subcutaneous tissue, unspecified: Secondary | ICD-10-CM

## 2020-03-20 DIAGNOSIS — Z23 Encounter for immunization: Secondary | ICD-10-CM | POA: Diagnosis not present

## 2020-03-20 DIAGNOSIS — E785 Hyperlipidemia, unspecified: Secondary | ICD-10-CM

## 2020-03-20 DIAGNOSIS — N400 Enlarged prostate without lower urinary tract symptoms: Secondary | ICD-10-CM | POA: Insufficient documentation

## 2020-03-20 DIAGNOSIS — R7989 Other specified abnormal findings of blood chemistry: Secondary | ICD-10-CM | POA: Diagnosis not present

## 2020-03-20 LAB — MICROALBUMIN / CREATININE URINE RATIO
Creatinine,U: 77.7 mg/dL
Microalb Creat Ratio: 1.2 mg/g (ref 0.0–30.0)
Microalb, Ur: 0.9 mg/dL (ref 0.0–1.9)

## 2020-03-20 LAB — HEPATIC FUNCTION PANEL
ALT: 59 U/L — ABNORMAL HIGH (ref 0–53)
AST: 44 U/L — ABNORMAL HIGH (ref 0–37)
Albumin: 4.7 g/dL (ref 3.5–5.2)
Alkaline Phosphatase: 28 U/L — ABNORMAL LOW (ref 39–117)
Bilirubin, Direct: 0.3 mg/dL (ref 0.0–0.3)
Total Bilirubin: 1.7 mg/dL — ABNORMAL HIGH (ref 0.2–1.2)
Total Protein: 7.4 g/dL (ref 6.0–8.3)

## 2020-03-20 LAB — CBC WITH DIFFERENTIAL/PLATELET
Basophils Absolute: 0.1 10*3/uL (ref 0.0–0.1)
Basophils Relative: 0.8 % (ref 0.0–3.0)
Eosinophils Absolute: 0.1 10*3/uL (ref 0.0–0.7)
Eosinophils Relative: 1.1 % (ref 0.0–5.0)
HCT: 45.5 % (ref 39.0–52.0)
Hemoglobin: 15.8 g/dL (ref 13.0–17.0)
Lymphocytes Relative: 27 % (ref 12.0–46.0)
Lymphs Abs: 1.8 10*3/uL (ref 0.7–4.0)
MCHC: 34.7 g/dL (ref 30.0–36.0)
MCV: 92.8 fl (ref 78.0–100.0)
Monocytes Absolute: 0.8 10*3/uL (ref 0.1–1.0)
Monocytes Relative: 12.1 % — ABNORMAL HIGH (ref 3.0–12.0)
Neutro Abs: 3.8 10*3/uL (ref 1.4–7.7)
Neutrophils Relative %: 59 % (ref 43.0–77.0)
Platelets: 152 10*3/uL (ref 150.0–400.0)
RBC: 4.91 Mil/uL (ref 4.22–5.81)
RDW: 13.4 % (ref 11.5–15.5)
WBC: 6.5 10*3/uL (ref 4.0–10.5)

## 2020-03-20 LAB — LIPID PANEL
Cholesterol: 135 mg/dL (ref 0–200)
HDL: 42.6 mg/dL (ref >39.00)
LDL Cholesterol: 68 mg/dL (ref 0–99)
NonHDL: 92.83
Total CHOL/HDL Ratio: 3
Triglycerides: 124 mg/dL (ref 0.0–149.0)
VLDL: 24.8 mg/dL (ref 0.0–40.0)

## 2020-03-20 LAB — HEMOGLOBIN A1C: Hgb A1c MFr Bld: 7.5 % — ABNORMAL HIGH (ref 4.6–6.5)

## 2020-03-20 LAB — BASIC METABOLIC PANEL
BUN: 17 mg/dL (ref 6–23)
CO2: 30 mEq/L (ref 19–32)
Calcium: 9.8 mg/dL (ref 8.4–10.5)
Chloride: 101 mEq/L (ref 96–112)
Creatinine, Ser: 0.88 mg/dL (ref 0.40–1.50)
GFR: 88.97 mL/min (ref >60.00)
Glucose, Bld: 141 mg/dL — ABNORMAL HIGH (ref 70–99)
Potassium: 4.2 mEq/L (ref 3.5–5.1)
Sodium: 138 mEq/L (ref 135–145)

## 2020-03-20 LAB — PSA: PSA: 0.53 ng/mL (ref 0.10–4.00)

## 2020-03-20 LAB — VITAMIN B12: Vitamin B-12: 1292 pg/mL — ABNORMAL HIGH (ref 211–911)

## 2020-03-20 LAB — PROTIME-INR
INR: 1 ratio (ref 0.8–1.0)
Prothrombin Time: 11 s (ref 9.6–13.1)

## 2020-03-20 LAB — VITAMIN D 25 HYDROXY (VIT D DEFICIENCY, FRACTURES): VITD: 57.98 ng/mL (ref 30.00–100.00)

## 2020-03-20 MED ORDER — DAPAGLIFLOZIN PROPANEDIOL 10 MG PO TABS: 10.0000 mg | ORAL_TABLET | Freq: Every day | ORAL | 0 refills | Status: DC

## 2020-03-20 NOTE — Progress Notes (Signed)
Subjective:  Patient ID: [name], male    DOB: Jan 12, 1951  Age: 69 y.o. MRN: [id number]  CC: Annual Exam, Diabetes, Hyperlipidemia, and Hypertension  This visit occurred during the SARS-CoV-2 public health emergency.  Safety protocols were in place, including screening questions prior to the visit, additional usage of staff PPE, and extensive cleaning of exam room while observing appropriate contact time as indicated for disinfecting solutions.    HPI [name] presents for a CPX.  1.  Several months he has noticed a crusty skin lesion above his right ear.  There is no pain or swelling with this. 2.  He is concerned that his blood sugar is not very well controlled he denies polys.  He tells me he is compliant with Metformin but wonders if something needs to be added to this. 3.  He is active and denies any recent episodes of chest pain, shortness of breath, palpitations, edema, or diaphoresis.  Outpatient Medications Prior to Visit  Medication Sig Dispense Refill  . ALPRAZolam (XANAX) 1 MG tablet TAKE 1/2 TABLET BY MOUTH 4 TIMES A DAY 60 tablet 1  . aspirin EC 81 MG tablet Take 81 mg by mouth daily with lunch.    [name] azelastine (ASTELIN) 0.1 % nasal spray PLACE 2 SPRAYS INTO EACH NOSTRIL TWICE A DAY 30 mL 0  . bisacodyl (DULCOLAX) 10 MG suppository Place 1 suppository (10 mg total) rectally daily as needed for moderate constipation. 12 suppository 0  . calcium carbonate (TUMS EX) 750 MG chewable tablet Chew 1 tablet by mouth daily as needed for heartburn.     . cholecalciferol (VITAMIN D) 1000 UNITS tablet Take 2,000 Units by mouth daily.    . ciclopirox (PENLAC) 8 % solution Apply topically at bedtime. Apply over nail and surrounding skin. Apply daily over previous coat. After seven (7) days, may remove with alcohol and continue cycle. 6.6 mL 4  . EPINEPHRINE 0.3 mg/0.3 mL IJ SOAJ injection INJECT 0.3 MLS INTO THE MUSCLE ONCE AS DIRECTED 2 each 3  . escitalopram (LEXAPRO)  10 MG tablet Start with a half tablet in AM for 14 days before going on to 10 mg dose. 30 tablet 5  . fexofenadine (ALLEGRA) 180 MG tablet Take 90 mg by mouth 2 (two) times daily.     . fluticasone (FLONASE) 50 MCG/ACT nasal spray PLACE 2 SPRAYS INTO BOTH NOSTRILS DAILY. 16 g 2  . hydrochlorothiazide (HYDRODIURIL) 25 MG tablet Take 1 tablet (25 mg total) by mouth daily. Annual appt due in Oct must see provider for future refills 90 tablet 1  . metFORMIN (GLUCOPHAGE-XR) 500 MG 24 hr tablet Take 3 tablets (1,500 mg total) by mouth daily with breakfast. 270 tablet 3  . metoprolol tartrate (LOPRESSOR) 25 MG tablet TAKE 1 TABLET (25 MG TOTAL) BY MOUTH 3 (THREE) TIMES DAILY. 270 tablet 1  . montelukast (SINGULAIR) 10 MG tablet TAKE 1/2 TABLET BY MOUTH 2 TIMES DAILY. 90 tablet 1  . Multiple Vitamin (MULTIVITAMIN WITH MINERALS) TABS Take 1 tablet by mouth daily.    [name] omega-3 acid ethyl esters (LOVAZA) 1 g capsule Take 1 capsule (1 g total) by mouth 2 (two) times daily. Annual appt due in Oct must see provider for future refills 180 capsule 1  . OVER THE COUNTER MEDICATION Apply 1 application topically daily as needed (for acne). Oxy 10 vanishing cream    . polyethylene glycol powder (GLYCOLAX/MIRALAX) powder MIX 17G WITH LIQUID AND DRINK BY MOUTH DAILY. 527 g  5  . PREVIDENT 5000 DRY MOUTH 1.1 % GEL dental gel APPLY THIN RIBBON AND BRUSH FOR 2 MINUTES DAILY IN PLACE OF CONVENTIONAL TOOTHPASTE  99  . rosuvastatin (CRESTOR) 10 MG tablet TAKE 1 TABLET BY MOUTH ONCE DAILY 90 tablet 1  . thiamine (VITAMIN B-1) 100 MG tablet Take 100 mg by mouth 2 (two) times daily at 10 AM and 5 PM.     . TURMERIC CURCUMIN PO Take by mouth.    . vitamin B-12 (CYANOCOBALAMIN) 500 MCG tablet Take 500-1,000 mcg by mouth daily.      No facility-administered medications prior to visit.    ROS Review of Systems  Constitutional: Negative.  Negative for appetite change, chills, diaphoresis, fatigue and unexpected weight change.    HENT: Negative.   Eyes: Negative for visual disturbance.  Respiratory: Negative for cough, chest tightness, shortness of breath and wheezing.   Cardiovascular: Negative for chest pain and palpitations.  Gastrointestinal: Negative for abdominal pain, constipation, diarrhea, nausea and vomiting.  Endocrine: Negative for polydipsia, polyphagia and polyuria.  Genitourinary: Negative.  Negative for difficulty urinating, scrotal swelling, testicular pain and urgency.  Musculoskeletal: Negative for arthralgias and myalgias.  Skin: Negative.  Negative for color change.  Neurological: Negative.  Negative for dizziness, weakness, light-headedness and headaches.  Hematological: Negative for adenopathy. Does not bruise/bleed easily.  Psychiatric/Behavioral: Negative for dysphoric mood, sleep disturbance and suicidal ideas. The patient is nervous/anxious.     Objective:  BP 136/74   Pulse (!) 58   Temp 98.1 F (36.7 C) (Oral)   Resp 16   Ht 6' 3.25" (1.911 m)   Wt 227 lb (103 kg)   SpO2 96%   BMI 28.18 kg/m   BP Readings from Last 3 Encounters:  03/20/20 136/74  03/11/20 122/68  03/05/20 125/63    Wt Readings from Last 3 Encounters:  03/20/20 227 lb (103 kg)  03/11/20 227 lb (103 kg)  03/05/20 228 lb (103.4 kg)    Physical Exam Constitutional:      Appearance: Normal appearance.  HENT:     Head:      Nose: Nose normal.     Mouth/Throat:     Mouth: Mucous membranes are moist.  Eyes:     General: No scleral icterus.    Conjunctiva/sclera: Conjunctivae normal.  Cardiovascular:     Rate and Rhythm: Normal rate and regular rhythm.     Heart sounds: S1 normal and S2 normal. Murmur heard.  Systolic murmur is present with a grade of 2/6.  No gallop.   Pulmonary:     Effort: Pulmonary effort is normal.     Breath sounds: No stridor. No wheezing, rhonchi or rales.  Abdominal:     General: Abdomen is flat. Bowel sounds are normal.     Palpations: There is no mass.      Tenderness: There is no abdominal tenderness. There is no guarding.     Hernia: No hernia is present.  Genitourinary:    Comments: He was not willing to undress for a GU/rectal exam. Musculoskeletal:        General: No swelling. Normal range of motion.     Cervical back: Neck supple.     Right lower leg: No edema.     Left lower leg: No edema.  Lymphadenopathy:     Cervical: No cervical adenopathy.  Skin:    General: Skin is warm and dry.     Coloration: Skin is not pale.  Neurological:  General: No focal deficit present.     Mental Status: He is alert.  Psychiatric:        Mood and Affect: Mood normal.        Behavior: Behavior normal.     Lab Results  Component Value Date   WBC 6.5 03/20/2020   HGB 15.8 03/20/2020   HCT 45.5 03/20/2020   PLT 152.0 03/20/2020   GLUCOSE 141 (H) 03/20/2020   CHOL 135 03/20/2020   TRIG 124.0 03/20/2020   HDL 42.60 03/20/2020   LDLDIRECT 114 (H) 03/21/2010   LDLCALC 68 03/20/2020   ALT 59 (H) 03/20/2020   AST 44 (H) 03/20/2020   NA 138 03/20/2020   K 4.2 03/20/2020   CL 101 03/20/2020   CREATININE 0.88 03/20/2020   BUN 17 03/20/2020   CO2 30 03/20/2020   TSH 0.84 03/19/2019   PSA 0.53 03/20/2020   INR 1.0 03/20/2020   HGBA1C 7.5 (H) 03/20/2020   MICROALBUR 0.9 03/20/2020    CT ANGIO CHEST AORTA W/CM &/OR WO/CM  Result Date: 06/13/2019 CLINICAL DATA:  Aortic dissection. Chest pain. EXAM: CT ANGIOGRAPHY CHEST, ABDOMEN AND PELVIS TECHNIQUE: Multidetector CT imaging through the chest, abdomen and pelvis was performed using the standard protocol during bolus administration of intravenous contrast. Multiplanar reconstructed images and MIPs were obtained and reviewed to evaluate the vascular anatomy. CONTRAST:  89mL ISOVUE-370 IOPAMIDOL (ISOVUE-370) INJECTION 76% COMPARISON:  June 02, 2018. FINDINGS: CTA CHEST FINDINGS Cardiovascular: Stable findings consistent with prior surgical repair of ascending thoracic aortic dissection.  Stable small focal pseudoaneurysm or penetrating ulcer is seen involving the proximal portion of the graft repair that currently measures 3.5 x 1.2 cm. There is seen arterial enhancement of a portion of this which is unchanged. This results in some external compression on the non coronary sinus as described above. Normal cardiac size. No pericardial effusion. Coronary artery calcifications are noted. Atherosclerosis of thoracic aorta is noted. Great vessels are widely patent without significant stenosis. There is stable type B dissection involving the descending thoracic aorta with increased thrombosis of the false lumen. Maximum aneurysmal dilatation of 4.6 cm is noted which is not significantly changed compared to prior exam. Mediastinum/Nodes: No enlarged mediastinal, hilar, or axillary lymph nodes. Thyroid gland, trachea, and esophagus demonstrate no significant findings. Lungs/Pleura: No pneumothorax or pleural effusion is noted. Mild emphysematous disease is noted throughout both lungs. No acute pulmonary abnormality is noted. Musculoskeletal: No chest wall abnormality. No acute or significant osseous findings. Review of the MIP images confirms the above findings. CTA ABDOMEN AND PELVIS FINDINGS VASCULAR Aorta: Status post aortobifemoral graft repair of abdominal aortic aneurysm. The graft and its limbs are widely patent. Thoracic aortic dissection noted on prior exam extends into the proximal aorta to the approximate level of the renal arteries. Celiac: Patent without evidence of aneurysm, dissection, vasculitis or significant stenosis. SMA: Patent without evidence of aneurysm, dissection, vasculitis or significant stenosis. Renals: Right renal artery arises from true lumen and is widely patent without significant stenosis. Dissection flap extends into the proximal portion of the left renal artery which appears to be predominantly supplied by false lumen. IMA: Occluded proximally due to graft repair.  Retrograde filling is noted. Inflow: Patent without evidence of aneurysm, dissection, vasculitis or significant stenosis. Veins: No obvious venous abnormality within the limitations of this arterial phase study. Review of the MIP images confirms the above findings. NON-VASCULAR Hepatobiliary: Cholelithiasis is noted. No biliary dilatation is noted. The liver is unremarkable. Pancreas: Unremarkable. No pancreatic ductal  dilatation or surrounding inflammatory changes. Spleen: Normal in size without focal abnormality. Adrenals/Urinary Tract: Adrenal glands are unremarkable. Kidneys are normal, without renal calculi, focal lesion, or hydronephrosis. Bladder is unremarkable. Stomach/Bowel: Stomach is within normal limits. Appendix appears normal. No evidence of bowel wall thickening, distention, or inflammatory changes. Stool is noted throughout the colon. Lymphatic: No significant adenopathy is noted. Reproductive: Prostate is unremarkable. Other: No abdominal wall hernia or abnormality. No abdominopelvic ascites. Musculoskeletal: Multilevel degenerative disc disease is noted in the lower lumbar spine. No acute osseous abnormality is noted. Review of the MIP images confirms the above findings. IMPRESSION: 1. Stable findings consistent with prior surgical repair of ascending thoracic aortic dissection. Stable small focal pseudoaneurysm or penetrating ulcer is seen involving the proximal portion of the graft repair that currently measures 3.5 x 1.2 cm. 2. Stable type B dissection is seen involving the descending thoracic aorta with increased thrombosis of the false lumen. Maximum aneurysmal dilatation of 4.6 cm is noted which is not significantly changed compared to prior exam. 3. Status post aortobifemoral graft repair of abdominal aortic aneurysm. The graft and its limbs are widely patent. 4. Dissection flap extends into the proximal portion of the left renal artery which appears to be predominantly supplied by false  lumen. 5. Cholelithiasis is noted without inflammation. 6. Emphysema and aortic atherosclerosis. Aortic Atherosclerosis (ICD10-I70.0) and Emphysema (ICD10-J43.9). Electronically Signed   By: Marijo Conception M.D.   On: 06/13/2019 10:40   CT ANGIO ABDOMEN PELVIS  W &/OR WO CONTRAST  Result Date: 06/13/2019 CLINICAL DATA:  Aortic dissection. Chest pain. EXAM: CT ANGIOGRAPHY CHEST, ABDOMEN AND PELVIS TECHNIQUE: Multidetector CT imaging through the chest, abdomen and pelvis was performed using the standard protocol during bolus administration of intravenous contrast. Multiplanar reconstructed images and MIPs were obtained and reviewed to evaluate the vascular anatomy. CONTRAST:  9mL ISOVUE-370 IOPAMIDOL (ISOVUE-370) INJECTION 76% COMPARISON:  June 02, 2018. FINDINGS: CTA CHEST FINDINGS Cardiovascular: Stable findings consistent with prior surgical repair of ascending thoracic aortic dissection. Stable small focal pseudoaneurysm or penetrating ulcer is seen involving the proximal portion of the graft repair that currently measures 3.5 x 1.2 cm. There is seen arterial enhancement of a portion of this which is unchanged. This results in some external compression on the non coronary sinus as described above. Normal cardiac size. No pericardial effusion. Coronary artery calcifications are noted. Atherosclerosis of thoracic aorta is noted. Great vessels are widely patent without significant stenosis. There is stable type B dissection involving the descending thoracic aorta with increased thrombosis of the false lumen. Maximum aneurysmal dilatation of 4.6 cm is noted which is not significantly changed compared to prior exam. Mediastinum/Nodes: No enlarged mediastinal, hilar, or axillary lymph nodes. Thyroid gland, trachea, and esophagus demonstrate no significant findings. Lungs/Pleura: No pneumothorax or pleural effusion is noted. Mild emphysematous disease is noted throughout both lungs. No acute pulmonary  abnormality is noted. Musculoskeletal: No chest wall abnormality. No acute or significant osseous findings. Review of the MIP images confirms the above findings. CTA ABDOMEN AND PELVIS FINDINGS VASCULAR Aorta: Status post aortobifemoral graft repair of abdominal aortic aneurysm. The graft and its limbs are widely patent. Thoracic aortic dissection noted on prior exam extends into the proximal aorta to the approximate level of the renal arteries. Celiac: Patent without evidence of aneurysm, dissection, vasculitis or significant stenosis. SMA: Patent without evidence of aneurysm, dissection, vasculitis or significant stenosis. Renals: Right renal artery arises from true lumen and is widely patent without significant stenosis. Dissection flap  extends into the proximal portion of the left renal artery which appears to be predominantly supplied by false lumen. IMA: Occluded proximally due to graft repair. Retrograde filling is noted. Inflow: Patent without evidence of aneurysm, dissection, vasculitis or significant stenosis. Veins: No obvious venous abnormality within the limitations of this arterial phase study. Review of the MIP images confirms the above findings. NON-VASCULAR Hepatobiliary: Cholelithiasis is noted. No biliary dilatation is noted. The liver is unremarkable. Pancreas: Unremarkable. No pancreatic ductal dilatation or surrounding inflammatory changes. Spleen: Normal in size without focal abnormality. Adrenals/Urinary Tract: Adrenal glands are unremarkable. Kidneys are normal, without renal calculi, focal lesion, or hydronephrosis. Bladder is unremarkable. Stomach/Bowel: Stomach is within normal limits. Appendix appears normal. No evidence of bowel wall thickening, distention, or inflammatory changes. Stool is noted throughout the colon. Lymphatic: No significant adenopathy is noted. Reproductive: Prostate is unremarkable. Other: No abdominal wall hernia or abnormality. No abdominopelvic ascites.  Musculoskeletal: Multilevel degenerative disc disease is noted in the lower lumbar spine. No acute osseous abnormality is noted. Review of the MIP images confirms the above findings. IMPRESSION: 1. Stable findings consistent with prior surgical repair of ascending thoracic aortic dissection. Stable small focal pseudoaneurysm or penetrating ulcer is seen involving the proximal portion of the graft repair that currently measures 3.5 x 1.2 cm. 2. Stable type B dissection is seen involving the descending thoracic aorta with increased thrombosis of the false lumen. Maximum aneurysmal dilatation of 4.6 cm is noted which is not significantly changed compared to prior exam. 3. Status post aortobifemoral graft repair of abdominal aortic aneurysm. The graft and its limbs are widely patent. 4. Dissection flap extends into the proximal portion of the left renal artery which appears to be predominantly supplied by false lumen. 5. Cholelithiasis is noted without inflammation. 6. Emphysema and aortic atherosclerosis. Aortic Atherosclerosis (ICD10-I70.0) and Emphysema (ICD10-J43.9). Electronically Signed   By: Marijo Conception M.D.   On: 06/13/2019 10:40    Assessment & Plan:   Mareo was seen today for annual exam, diabetes, hyperlipidemia and hypertension.  Diagnoses and all orders for this visit:  Flu vaccine need -     Flu Vaccine QUAD High Dose(Fluad)  Hyperlipidemia associated with type 2 diabetes mellitus ([place])- He has achieved his LDL goal and is doing well on the statin. -     Lipid panel; Future -     Hepatic function panel; Future -     Hepatic function panel -     Lipid panel  Type 2 diabetes mellitus without complication, without long-term current use of insulin ([place])- His A1c is at 7.5%.  I recommended that he stay on the current dose of Metformin and to add an SGLT2 inhibitor for blood sugar reduction and for cardiovascular risk reduction. -     CBC with Differential/Platelet; Future -     Cancel:  BASIC METABOLIC PANEL WITH GFR; Future -     Microalbumin / creatinine urine ratio; Future -     Hemoglobin A1c; Future -     dapagliflozin propanediol (FARXIGA) 10 MG TABS tablet; Take 1 tablet (10 mg total) by mouth daily before breakfast. -     Basic metabolic panel; Future -     Basic metabolic panel -     Hemoglobin A1c -     Microalbumin / creatinine urine ratio -     CBC with Differential/Platelet  Routine general medical examination at a health care facility- Exam completed, labs reviewed, vaccines reviewed and updated, cancer screenings are  up-to-date, patient education was given.  Vitamin D deficiency- His vitamin D level is normal now. -     VITAMIN D 25 Hydroxy (Vit-D Deficiency, Fractures); Future -     VITAMIN D 25 Hydroxy (Vit-D Deficiency, Fractures)  Severe episode of recurrent major depressive disorder, without psychotic features ([place])- He is not willing to take an antidepressant.  Other polyneuropathy- His B12 level is supratherapeutic. -     Vitamin B12; Future -     Vitamin B12  Elevated LFTs- His LFTs remain mildly elevated.  His MELD/Na+ score is reassuring at 8.  He had a CT scan done less than a year ago that showed a normal liver.  It does not sound like he drinks very much.  Screening for viral hepatitis is negative.  I have asked him to avoid alcohol and to be vaccinated against hep A and B.  Will continue to monitor his liver enzymes. -     Protime-INR; Future -     Hepatic function panel; Future -     Hepatitis B surface antibody,quantitative; Future -     Hepatitis B surface antigen; Future -     Hepatitis A antibody, total; Future -     Hepatitis B core antibody, total; Future -     Hepatitis C antibody; Future -     Hepatitis C antibody -     Hepatitis B core antibody, total -     Hepatitis A antibody, total -     Hepatitis B surface antibody,quantitative -     Hepatitis B surface antigen -     Hepatic function panel -     Protime-INR  Benign  prostatic hyperplasia without lower urinary tract symptoms- His PSA is low which is a reassuring sign that he does not have prostate cancer. -     PSA; Future -     PSA  Skin lesion of scalp -     Ambulatory referral to Dermatology  Other orders -     Pneumococcal conjugate vaccine 13-valent   I am having Joaquin Music. Treiber start on dapagliflozin propanediol. I am also having him maintain his fexofenadine, cholecalciferol, aspirin EC, thiamine, OVER THE COUNTER MEDICATION, multivitamin with minerals, vitamin B-12, calcium carbonate, bisacodyl, polyethylene glycol powder, PreviDent 5000 Dry Mouth, TURMERIC CURCUMIN PO, EPINEPHrine, metFORMIN, azelastine, omega-3 acid ethyl esters, montelukast, rosuvastatin, hydrochlorothiazide, fluticasone, metoprolol tartrate, ALPRAZolam, ciclopirox, and escitalopram.  Meds ordered this encounter  Medications  . dapagliflozin propanediol (FARXIGA) 10 MG TABS tablet    Sig: Take 1 tablet (10 mg total) by mouth daily before breakfast.    Dispense:  70 tablet    Refill:  0   In addition to time spent on CPE, I spent 50 minutes in preparing to see the patient by review of recent labs, imaging and procedures, obtaining and reviewing separately obtained history, communicating with the patient and family or caregiver, ordering medications, tests or procedures, and documenting clinical information in the EHR including the differential Dx, treatment, and any further evaluation and other management of 1. Hyperlipidemia associated with type 2 diabetes mellitus ([place]) 2. Type 2 diabetes mellitus without complication, without long-term current use of insulin ([place]) 3. Vitamin D deficiency 4. Severe episode of recurrent major depressive disorder, without psychotic features ([place]) 5. Other polyneuropathy 6. Elevated LFTs 7. Benign prostatic hyperplasia without lower urinary tract symptoms 8. Skin lesion of scalp     Follow-up: Return in about 3 months (around  06/20/2020).  Scarlette Calico, MD

## 2020-03-20 NOTE — Patient Instructions (Signed)

## 2020-03-20 NOTE — Patient Outreach (Signed)
[facility]) Care Management  03/20/2020  [name] 1950-11-06 [id number]   CSW spoke with pt who reports he went to see a Neurologist and was told his symptoms related to depression and dementia may be "pseudo" in nature. Pt feels that "Covid" played into his results on the depression screening done at appointment. Pt also shares that he was prescribed an SSRI but has not taken it yet because of his past experience in which he had a "seratonin storm".  CSW suggested that pt make Provider aware and inquire about other RX options.  He plans to discuss further with his PCP.   Pt continues to enjoy his social outings and is encouraged to seek more opportunities to connect.  CSW will outreach pt again in the next 2-3 weeks.  CSW reminded pt to call if needs arise prior to this call.    [name], MSW, [place] Worker  [place] [phone]

## 2020-03-21 ENCOUNTER — Other Ambulatory Visit: Payer: Self-pay

## 2020-03-21 LAB — HEPATITIS C ANTIBODY
Hepatitis C Ab: NONREACTIVE
SIGNAL TO CUT-OFF: 0.04 (ref <1.00)

## 2020-03-21 LAB — HEPATITIS B CORE ANTIBODY, TOTAL: Hep B Core Total Ab: NONREACTIVE

## 2020-03-21 LAB — HEPATITIS B SURFACE ANTIBODY, QUANTITATIVE: Hep B S AB Quant (Post): <5 m[IU]/mL — ABNORMAL LOW (ref >=10)

## 2020-03-21 LAB — HEPATITIS A ANTIBODY, TOTAL: Hepatitis A AB,Total: REACTIVE — AB

## 2020-03-21 LAB — HEPATITIS B SURFACE ANTIGEN: Hepatitis B Surface Ag: NONREACTIVE

## 2020-03-21 NOTE — Patient Outreach (Signed)
  [facility] [place] [facility]) Care Management Chronic Special Needs Program    03/21/2020  Name: [name], DOB: 1951/03/05  MRN: [id number]   [name] is enrolled in a chronic special needs plan for Diabetes. Telephone call to client regarding 24 hour nurse call line referral. HIPAA verified. Client states he contacted the 24 hour nurse call line because of symptoms after having his pneumonia and flu shot. Client states he had a low grade fever, muscle aches and discomfort at the shot site. Client states he took tylenol and has felt much better.  Discussed pneumonia vaccine guidelines with client.   PLAN; RNCM will follow up with client at next scheduled outreach per CSNP tier level.  [name] RN,BSN,CCM [facility] Management [phone]

## 2020-03-21 NOTE — Patient Outreach (Signed)
  [facility]) Care Management Chronic Special Needs Program    03/21/2020  Name: [name], DOB: 03/25/1951  MRN: [id number]   Mr. Matt Delpizzo is enrolled in a chronic special needs plan for Diabetes.  Telephone call to client regarding 24 hour nurse advice line follow up. Unable to reach. HIPAA compliant voice message left with call back phone number and return call request.   PLAN; RNCM will attempt 2nd telephone call to client in 1 week.   [name] RN,BSN,CCM [facility] Care Management [phone]

## 2020-03-24 ENCOUNTER — Ambulatory Visit: Payer: Self-pay

## 2020-03-24 ENCOUNTER — Telehealth: Payer: Self-pay | Admitting: Pharmacist

## 2020-03-24 NOTE — Progress Notes (Signed)
Received call from patient wanting to discuss new medication Farxiga.  Pt saw Dr Ronnald Ramp last week and was prescribed Farxiga 10 mg due to A1c 7.5%. Pt is concerned about side effects. Discussed common and severe side effects, pt has no history of UTI and is at low risk for urinary side effects. Discussed benefits of Farxiga for heart and kidneys as well. Pt agreed to start the medication as prescribed.

## 2020-03-25 ENCOUNTER — Encounter: Payer: Self-pay | Admitting: Internal Medicine

## 2020-04-02 ENCOUNTER — Other Ambulatory Visit: Payer: Self-pay | Admitting: *Deleted

## 2020-04-02 ENCOUNTER — Ambulatory Visit: Payer: Self-pay | Admitting: *Deleted

## 2020-04-02 NOTE — Patient Outreach (Signed)
[facility]) Care Management  04/02/2020  [name] 1951/02/19 [id number]   CSW spoke with pt today by phone who reported he is doing well. Pt has enjoyed some social interactions/opportunities with friends including coffee with a friend and visiting a friend's vineyard another day.   Pt shared that he has made arrangements to see a Dermatologist due to a spot that is questionable basal cell.  CSW praised pt for advocating for himself to see that this was addressed.  Pt otherwise, denies any new issues or concerns and is encouraged to continue seeking and participating in social engaging activities as this is a major source of pt's loss since retiring and with COVID restrictions.   Pt voices an interest in setting up for counseling support.  He does not feel he needs Psychiatry at this time.   CSW offered support, encouragement and validated pt's current feelings/emotions.   CSW provided pt with some info on a meet up group that he may enjoy- will mail him info on this as well as list of providers for counseling.   CSW to follow up in 2-3 weeks.    [name], MSW, [place] Worker  [place] [phone]

## 2020-04-04 ENCOUNTER — Ambulatory Visit (HOSPITAL_COMMUNITY): Admission: EM | Admit: 2020-04-04 | Discharge: 2020-04-04 | Discharge status: Against Medical Advice | Payer: HMO

## 2020-04-04 ENCOUNTER — Encounter: Payer: Self-pay | Admitting: Internal Medicine

## 2020-04-04 ENCOUNTER — Ambulatory Visit (INDEPENDENT_AMBULATORY_CARE_PROVIDER_SITE_OTHER): Payer: HMO | Admitting: Internal Medicine

## 2020-04-04 ENCOUNTER — Telehealth: Payer: Self-pay

## 2020-04-04 ENCOUNTER — Telehealth: Payer: Self-pay | Admitting: Internal Medicine

## 2020-04-04 ENCOUNTER — Other Ambulatory Visit: Payer: Self-pay

## 2020-04-04 ENCOUNTER — Other Ambulatory Visit: Payer: Self-pay | Admitting: Internal Medicine

## 2020-04-04 VITALS — BP 136/68 | HR 67 | Temp 98.3°F | Ht 76.0 in | Wt 228.5 lb

## 2020-04-04 DIAGNOSIS — N4889 Other specified disorders of penis: Secondary | ICD-10-CM

## 2020-04-04 DIAGNOSIS — R399 Unspecified symptoms and signs involving the genitourinary system: Secondary | ICD-10-CM | POA: Diagnosis not present

## 2020-04-04 MED ORDER — FLUCONAZOLE 150 MG PO TABS: 150.0000 mg | ORAL_TABLET | Freq: Once | ORAL | 0 refills | Status: DC

## 2020-04-04 NOTE — Assessment & Plan Note (Signed)
Mr. Antwone Capozzoli is presenting for concerns of scrotal erythema and burning and burning at the penile tip for one day duration. Patient denies any fevers/chills, penile discharge, scrotal pain, urinary changes or changes of sensation with urination. Given that patient was recently started on Farxiga, concerns for candida vs Fournier's gangrene.  On examination, patient did not have any evidence of scrotal erythema, edema, warmth or tenderness. No testicular or penile abnormality noted. No urethral discharge evident.  Will evaluate for UTI with UA. However, suspect that his symptoms are likely self limited. However, will advise him to discontinue Farxiga at this time. Patient is requesting one time prescription for diflucan to cover for candida infection.   Plan: - F/u Urinalysis - Patient advised to discontinue [place] and follow up with PCP - Diflucan 150mg  x1

## 2020-04-04 NOTE — Progress Notes (Signed)
   CC: scrotal burning and erythema  HPI:  [name] is a 69 y.o. male with PMHx as listed below. Notes has been on [place] for one week and has been doing well. Notes that started having scrotal burning and redness with similar burning at the tip of penis. No fevers. No discharge. No change in burning with urination. Please see problem based charting for complete assessment and plan.   Past Medical History:  Diagnosis Date  . AAA (abdominal aortic aneurysm) ([place])   . ALLERGIC RHINITIS   . Anemia    post op, treated /w Fe  . Anxiety   . Arthritis    OA, hands, - thumbs- specifically  . Brachial plexopathy    double crush injury with median nerve damage on the right dominant hand.   [name] COPD (chronic obstructive pulmonary disease) ([place])    also states he has been told that he has slight emphysema   . Dissecting aortic aneurysm, thoracic ([place]) 02/20/2012   s/p emergent surgical repair  . DM2 (diabetes mellitus, type 2) ([place])   . DVT (deep venous thrombosis) ([facility])    coumadin- x3 months , post op  . ERECTILE DYSFUNCTION, ORGANIC   . GERD (gastroesophageal reflux disease)    related to diet & anxiety   . HYPERLIPIDEMIA   . Hypertension    pt. followwed by Dr. Johnsie Cancel  . MIGRAINE HEADACHE    induced from physical , emotional stress , but sometimes occur randomly, also experiences BPPV  . OBSTRUCTIVE SLEEP APNEA 02/2008 sleep study   moderate & central- not able to tolerate CPAP   . Thyroid nodule 04/21/2015   2.3 cm on right, incidental finding on 03/2015 CT chest - f/u US ordered  . Unspecified vitamin D deficiency    Review of Systems:  Negative except as stated in HPI.  Physical Exam:  Vitals:   04/04/20 1457  BP: 136/68  Pulse: 67  Temp: 98.3 F (36.8 C)  TempSrc: Oral  SpO2: 98%  Weight: 228 lb 8 oz (103.6 kg)  Height: 6\' 4"  (1.93 m)   Physical Exam  Constitutional: Appears well-developed and well-nourished. No distress.  HENT: Normocephalic and atraumatic,  EOMI, conjunctiva normal, moist mucous membranes Musculoskeletal: Normal bulk and tone.  No peripheral edema noted. GU: scrotal skin without any erythema, edema, warmth or tenderness, testes are equal in size, smooth and firm; no ulcerations or rash/erythema noted at the penile shaft or prepuce, glans or foreskin; no urethral discharge noted  Skin: Warm and dry.  No rash, erythema, lesions noted. Psychiatric: Normal mood and affect. Behavior is normal. Judgment and thought content normal.    Assessment & Plan:   See Encounters Tab for problem based charting.  Patient seen with Dr. Dareen Piano

## 2020-04-04 NOTE — Patient Instructions (Signed)
[name],  It was a pleasure seeing you in clinic. Today we discussed:   Scrotal and penile burning: At this time, please discontinue your [place]. I am checking a urinalysis to make sure there is no urinary infection.  If you notice worsening redness/swelling or tenderness, fevers/chills, or penile discharge, please present to urgent care or emergency department. Follow up with your PCP.   If you have any questions or concerns, please call our clinic at [phone] between 9am-5pm and after hours call [phone] and ask for the internal medicine resident on call. If you feel you are having a medical emergency please call 911.   Thank you, we look forward to helping you remain healthy!

## 2020-04-04 NOTE — Telephone Encounter (Signed)
   Patient calling to report burning, painful , red scrotum. Patient states it starting after taking dapagliflozin propanediol (FARXIGA) 10 MG TABS tablet   Call transferred to [facility]

## 2020-04-04 NOTE — Telephone Encounter (Signed)
Pt called Access Nurse & left message this am requesting advice; experiencing burning at the endo fo his penis & scrotum pain since the morning, he is taking farxiga medication & he thinks that is the cause. Pt arrived in office at this time seeking advice.  No open appts here or at other offices.  Dr Quay Burow notified of pt complaint, recommendation made for pt to be seen at urgent care.   Pt aware & verb understanding.

## 2020-04-05 LAB — URINALYSIS, ROUTINE W REFLEX MICROSCOPIC
Bilirubin, UA: NEGATIVE
Ketones, UA: NEGATIVE
Leukocytes,UA: NEGATIVE
Nitrite, UA: NEGATIVE
Protein,UA: NEGATIVE
RBC, UA: NEGATIVE
Specific Gravity, UA: 1.026 (ref 1.005–1.030)
Urobilinogen, Ur: 0.2 mg/dL (ref 0.2–1.0)
pH, UA: 5 (ref 5.0–7.5)

## 2020-04-07 NOTE — Progress Notes (Signed)
Internal Medicine Clinic Attending  I saw and evaluated the patient.  I personally confirmed the key portions of the history and exam documented by Dr. Aslam and I reviewed pertinent patient test results.  The assessment, diagnosis, and plan were formulated together and I agree with the documentation in the resident's note.     

## 2020-04-11 ENCOUNTER — Other Ambulatory Visit: Payer: Self-pay | Admitting: Internal Medicine

## 2020-04-15 ENCOUNTER — Ambulatory Visit: Payer: HMO

## 2020-04-15 DIAGNOSIS — F419 Anxiety disorder, unspecified: Secondary | ICD-10-CM

## 2020-04-15 DIAGNOSIS — F332 Major depressive disorder, recurrent severe without psychotic features: Secondary | ICD-10-CM

## 2020-04-15 DIAGNOSIS — E1169 Type 2 diabetes mellitus with other specified complication: Secondary | ICD-10-CM

## 2020-04-15 DIAGNOSIS — G6289 Other specified polyneuropathies: Secondary | ICD-10-CM

## 2020-04-15 DIAGNOSIS — E119 Type 2 diabetes mellitus without complications: Secondary | ICD-10-CM

## 2020-04-15 NOTE — Chronic Care Management (AMB) (Signed)
Chronic Care Management Pharmacy  Name: [name]  MRN: [id number] DOB: 08-12-1950   Chief Complaint/ HPI  [name],  69 y.o. , male presents for their Initial CCM visit with the clinical pharmacist via telephone due to COVID-19 Pandemic.  PCP : Hoyt Koch, MD Patient Care Team: Hoyt Koch, MD as PCP - General (Internal Medicine) Prescott Gum, Collier Salina, MD (Cardiothoracic Surgery) Pennie Banter, RPH-CPP (Pharmacist) Thurman Coyer, DO (Sports Medicine) Dohmeier, Asencion Partridge, MD (Neurology) Dannielle Karvonen, RN as [facility] Deirdre Peer, LCSW as [facility] Charlton Haws, [facility] as Pharmacist (Pharmacist)  Their chronic conditions include: Hypertension, Hyperlipidemia, Diabetes, Asthma, Depression, Anxiety, BPH and Allergic Rhinitis, Cerebrovascular disease, Migraine, AAA w/o rupture, OSA, peripheral neuropathy, mild cognitive impairment  Office Visits: 03/20/20 Dr Ronnald Ramp OV: chronic f/u. A1c 7.5%, added Farxiga to metformin. Elevated LFTs - advised to get Hep A and B vaccines. Referred to dermatology for scalp lesion.  Consult Visit: 04/04/20 Dr Julien Nordmann (internal medicine): presented with penile burning, pt advised to stop Iran and rx'd Diflucan. UA + for glucosuria, otherwise normal.  03/11/20 Dr Johnsie Cancel (cardiology): f/u for AAA  03/05/20 Dr Brett Fairy (neurology): f/u for memory concerns. Dx pseudodementia. MOCA 28/30. Rx'd escitalopram 10 mg and referred to depression counseling.  Allergies  Allergen Reactions  . Testosterone Shortness Of Breath    Medication:Androgel Pt states trouble breathing at night  . Codeine Nausea And Vomiting  . Gabapentin     "drunk" feeling  . Ultram [Tramadol] Nausea And Vomiting  . Albuterol Other (See Comments)    Panic attack    Medications: Outpatient Encounter Medications as of 04/15/2020  Medication Sig  . ALPRAZolam (XANAX) 1 MG tablet  TAKE 1/2 TABLET BY MOUTH 4 TIMES A DAY  . aspirin EC 81 MG tablet Take 81 mg by mouth daily with lunch.  [name] azelastine (ASTELIN) 0.1 % nasal spray PLACE 2 SPRAYS INTO EACH NOSTRIL TWICE A DAY  . bisacodyl (DULCOLAX) 10 MG suppository Place 1 suppository (10 mg total) rectally daily as needed for moderate constipation.  . calcium carbonate (TUMS EX) 750 MG chewable tablet Chew 1 tablet by mouth daily as needed for heartburn.   . cholecalciferol (VITAMIN D) 1000 UNITS tablet Take 2,000 Units by mouth daily.  . ciclopirox (PENLAC) 8 % solution Apply topically at bedtime. Apply over nail and surrounding skin. Apply daily over previous coat. After seven (7) days, may remove with alcohol and continue cycle.  [name] EPINEPHRINE 0.3 mg/0.3 mL IJ SOAJ injection INJECT 0.3 MLS INTO THE MUSCLE ONCE AS DIRECTED  . fexofenadine (ALLEGRA) 180 MG tablet Take 90 mg by mouth 2 (two) times daily.   . fluticasone (FLONASE) 50 MCG/ACT nasal spray PLACE 2 SPRAYS INTO BOTH NOSTRILS DAILY.  . hydrochlorothiazide (HYDRODIURIL) 25 MG tablet Take 1 tablet (25 mg total) by mouth daily. Annual appt due in Oct must see provider for future refills  . metFORMIN (GLUCOPHAGE-XR) 500 MG 24 hr tablet Take 3 tablets (1,500 mg total) by mouth daily with breakfast.  . metoprolol tartrate (LOPRESSOR) 25 MG tablet TAKE 1 TABLET (25 MG TOTAL) BY MOUTH 3 (THREE) TIMES DAILY.  . montelukast (SINGULAIR) 10 MG tablet TAKE 1/2 TABLET BY MOUTH 2 TIMES DAILY.  . Multiple Vitamin (MULTIVITAMIN WITH MINERALS) TABS Take 1 tablet by mouth daily.  [name] omega-3 acid ethyl esters (LOVAZA) 1 g capsule Take 1 capsule (1 g total) by mouth 2 (two) times daily.  Annual appt due in Oct must see provider for future refills  . OVER THE COUNTER MEDICATION Apply 1 application topically daily as needed (for acne). Oxy 10 vanishing cream  . polyethylene glycol powder (GLYCOLAX/MIRALAX) powder MIX 17G WITH LIQUID AND DRINK BY MOUTH DAILY.  [name] PREVIDENT 5000 DRY MOUTH 1.1 %  GEL dental gel APPLY THIN RIBBON AND BRUSH FOR 2 MINUTES DAILY IN PLACE OF CONVENTIONAL TOOTHPASTE  . rosuvastatin (CRESTOR) 10 MG tablet TAKE 1 TABLET BY MOUTH ONCE DAILY  . thiamine (VITAMIN B-1) 100 MG tablet Take 100 mg by mouth 2 (two) times daily at 10 AM and 5 PM.   . TURMERIC CURCUMIN PO Take by mouth.  . vitamin B-12 (CYANOCOBALAMIN) 500 MCG tablet Take 500-1,000 mcg by mouth daily.   . dapagliflozin propanediol (FARXIGA) 10 MG TABS tablet Take 1 tablet (10 mg total) by mouth daily before breakfast. (Patient not taking: Reported on 04/16/2020)  . escitalopram (LEXAPRO) 10 MG tablet Start with a half tablet in AM for 14 days before going on to 10 mg dose. (Patient not taking: Reported on 04/16/2020)  . [DISCONTINUED] oxyCODONE (OXY IR/ROXICODONE) 5 MG immediate release tablet Take 1 tablet (5 mg total) by mouth every 4 (four) hours as needed.   No facility-administered encounter medications on file as of 04/15/2020.    Wt Readings from Last 3 Encounters:  04/04/20 228 lb 8 oz (103.6 kg)  03/20/20 227 lb (103 kg)  03/11/20 227 lb (103 kg)    Current Diagnosis/Assessment:  SDOH Interventions     Most Recent Value  SDOH Interventions  Financial Strain Interventions Intervention Not Indicated      Goals Addressed            This Visit's Progress   . Pharmacy Care Plan       CARE PLAN ENTRY (see longitudinal plan of care for additional care plan information)  Current Barriers:  . Chronic Disease Management support, education, and care coordination needs related to Hypertension, Hyperlipidemia, Diabetes, Depression, and Anxiety   Hypertension BP Readings from Last 3 Encounters:  04/04/20 136/68  03/20/20 136/74  03/11/20 122/68 .  Pharmacist Clinical Goal(s): o Over the next 60 days, patient will work with PharmD and providers to maintain BP goal <130/80 . Current regimen:  o HCTZ 25 mg daily o Metoprolol tartrate 25 mg 3 times daily . Interventions: o Discussed BP  goals and benefits of medications for prevention of heart attack / stroke . Patient self care activities - Over the next 90 days, patient will: o Check BP daily, document, and provide at future appointments o Ensure daily salt intake < 2300 mg/day  Hyperlipidemia Lab Results  Component Value Date/Time   LDLCALC 68 03/20/2020 03:24 PM   LDLCALC 36 06/20/2008 12:00 AM   LDLDIRECT 114 (H) 03/21/2010 01:04 AM .  Pharmacist Clinical Goal(s): o Over the next 90 days, patient will work with PharmD and providers to maintain LDL goal < 70 . Current regimen:  o Rosuvastatin 10 mg daily o Omega-3 ethyl esters 1 g twice a day o Aspirin 81 mg  . Interventions: o Discussed cholesterol goals and benefits of medications for prevention of heart attack / stroke . Patient self care activities - Over the next 90 days, patient will: o Continue current medications  Diabetes Lab Results  Component Value Date/Time   HGBA1C 7.5 (H) 03/20/2020 03:24 PM   HGBA1C 7.7 (H) 06/19/2019 01:57 PM .  Pharmacist Clinical Goal(s): o Over the next 60 days,  patient will work with PharmD and providers to achieve A1c goal <7% . Current regimen:  o Metformin ER 500 mg - 3 tabs AM . Interventions: o Discussed importance of maintaining sugars at goal to prevent complications of diabetes including kidney damage, retinal damage, and cardiovascular disease o Discussed possibility of increasing metformin to 2000 mg/day or adding a GLP-1 agonist like Rybelsus (patient assistance available) . Patient self care activities - Over the next 60 days, patient will: o Continue current medications o Follow up with endocrinologist as scheduled  Depression / Anxiety . Pharmacist Clinical Goal(s) o Over the next 60 days, patient will work with PharmD and providers to optimize therapy . Current regimen:  o No medication . Interventions: o Discussed benefits of escitalopram and importance of starting SSRI at low dose . Patient self  care activities - Over the next 60 days, patient will: o Consider starting escitalopram at 5 mg  Neuropathy . Pharmacist Clinical Goal(s) o Over the next 60 days, patient will work with PharmD and providers to optimize therapy . Current regimen:  o No medication . Interventions: o Discussed patient's history of intolerance to gabapentin and amitriptyline o Discussed benefits of capsaicin cream for neuropathic pain in feet . Patient self care activities - Over the next 60 days, patient will: o Try Capsaicin cream for neuropathy in feet - apply twice a day and wait 2 weeks for full benefit  Medication management . Pharmacist Clinical Goal(s): o Over the next 60 days, patient will work with PharmD and providers to maintain optimal medication adherence . Current pharmacy: [facility] . Interventions o Comprehensive medication review performed. o Continue current medication management strategy . Patient self care activities - Over the next 60 days, patient will: o Focus on medication adherence by fill date o Take medications as prescribed o Report any questions or concerns to PharmD and/or provider(s)  Initial goal documentation       Hypertension   BP goal is:  <130/80  Office blood pressures are  BP Readings from Last 3 Encounters:  04/04/20 136/68  03/20/20 136/74  03/11/20 122/68   Lab Results  Component Value Date   CREATININE 0.88 03/20/2020   BUN 17 03/20/2020   GFR 88.97 03/20/2020   GFRNONAA >60 06/21/2018   GFRAA >60 06/21/2018   NA 138 03/20/2020   K 4.2 03/20/2020   CALCIUM 9.8 03/20/2020   CO2 30 03/20/2020   Patient checks BP at home infrequently Patient home BP readings are ranging: n/a  Patient has failed these meds in the past: n/a Patient is currently controlled on the following medications:  . HCTZ 25 mg daily . Metoprolol tartrate 25 mg TID  We discussed BP goals; benefits of medications; pt started metoprolol after aortic aneurysm  surgery in 2014; he does endorse generalized fatigue and believes it is related to beta blocker; of note pt also endorses depression/anxiety which can also cause fatigue; pt may benefit from lower dose of metoprolol but wishes to discuss with prescriber  Plan  Continue current medications  Pt to discuss metoprolol dose/fatigue with prescriber  Hyperlipidemia   LDL goal < 70 Cerebrovascular disease w/o stroke  Last lipids Lab Results  Component Value Date   CHOL 135 03/20/2020   HDL 42.60 03/20/2020   LDLCALC 68 03/20/2020   LDLDIRECT 114 (H) 03/21/2010   TRIG 124.0 03/20/2020   CHOLHDL 3 03/20/2020   Hepatic Function Latest Ref Rng & Units 03/20/2020 03/19/2019 06/21/2018  Total Protein 6.0 - 8.3 g/dL  7.4 7.2 6.6  Albumin 3.5 - 5.2 g/dL 4.7 4.4 3.8  AST 0 - 37 U/L 44(H) 49(H) 47(H)  ALT 0 - 53 U/L 59(H) 61(H) 61(H)  Alk Phosphatase 39 - 117 U/L 28(L) 33(L) 28(L)  Total Bilirubin 0.2 - 1.2 mg/dL 1.7(H) 1.5(H) 1.4(H)  Bilirubin, Direct 0.0 - 0.3 mg/dL 0.3 - -    The 10-year ASCVD risk score Mikey Bussing DC Jr., et al., 2013) is: 26.7%   Values used to calculate the score:     Age: 76 years     Sex: Male     Is Non-Hispanic African American: No     Diabetic: Yes     Tobacco smoker: No     Systolic Blood Pressure: 383 mmHg     Is BP treated: Yes     HDL Cholesterol: 42.6 mg/dL     Total Cholesterol: 135 mg/dL   Patient has failed these meds in past: n/a Patient is currently controlled on the following medications:  . Rosuvastatin 10 mg daily . Lovaza 1 g BID . Aspirin 81 mg daily  We discussed: Cholesterol goals; benefits of statin for ASCVD risk reduction; pt is worried about LFTs, discussed very mild elevations and plan to continue monitoring without intervention unless LFTs become greater than 2-3x ULN.  Plan  Continue current medications  Diabetes   A1c goal <7%  Recent Relevant Labs: Lab Results  Component Value Date/Time   HGBA1C 7.5 (H) 03/20/2020 03:24 PM    HGBA1C 7.7 (H) 06/19/2019 01:57 PM   GFR 88.97 03/20/2020 03:24 PM   GFR 68.44 03/19/2019 03:58 PM   MICROALBUR 0.9 03/20/2020 03:24 PM   MICROALBUR 3.2 (H) 03/19/2019 03:58 PM    Last diabetic Eye exam:  Lab Results  Component Value Date/Time   HMDIABEYEEXA No Retinopathy 01/12/2019 12:00 AM    Last diabetic Foot exam: No results found for: HMDIABFOOTEX   Patient has failed these meds in past: glyburide, Farxiga (mycotic infection) Patient is currently uncontrolled on the following medications: [name] Metformin ER 500 mg - 3 tabs AM  We discussed: A1c goals; potential to increase metformin to 2000 mg/day - along with lifestyle modifications this may achieve A1c < 7%. P Also discussed GLP-1, pt is interested in Rybelsus which will be available to pt via patient assistance; pt wishes to discuss potential med changes with endocrinologist at scheduled visit next month  Plan  Continue current medications  Pt to discuss potential increase metformin vs add Rybelsus with endocrinologist  Depression / Anxiety   Depression screen [facility] 2/9 04/04/2020 02/05/2020 11/15/2019  Decreased Interest _0 Down, Depressed, Hopeless _1 PHQ - 2 Score _2 Altered sleeping _3 Tired, decreased energy _4 Change in appetite 0 0 1  Feeling bad or failure about yourself  0 2 0  Trouble concentrating 0 1 1  Moving slowly or fidgety/restless 0 0 0  Suicidal thoughts 0 0 0  PHQ-9 Score _5 Difficult doing work/chores Not difficult at all Not difficult at all Somewhat difficult  Some recent data might be hidden   Patient has failed these meds in past: amitriptyline, trazodone, bupropion, fluoxetine Patient is currently uncontrolled on the following medications:  . Escitalopram 10 mg daily  - not taking . Alprazolam 1 mg - 1/2 tab up to 4 times daily  We discussed:  Pt reports he had negative experiences with SSRIs in the past; he reports taking  Prozac and Wellbutrin ~20 years ago; discussed  benefits of escitalopram and risk of side effects relative to older medications; pt may consider starting escitalopram when he feels more stable; encouraged him to make appt with therapist for CBT in meantime  Plan  Continue current medications  Encouraged appt with therapist for CBT  Peripheral neuropathy   Patient has failed these meds in past: gabapentin, amitriptyline Patient is currently uncontrolled on the following medications:  . No medications  We discussed:  History of intolerance to first line options; pt has not tried pregabalin but is worried he will have same CNS effects as with gabapentin; discussed benefits of capsaicin cream   Plan  Recommend capsaicin cream - apply BID to feet for at least 2 weeks for full benefit  Constipation   Patient has failed these meds in past: n/a Patient is currently controlled on the following medications:  [name] Miralax daily . Calcium carbonate (Tums) 750 mg PRN . Bisacodyl 10 mg suppository PRN  We discussed:  Patient is satisfied with current regimen and denies issues  Plan  Continue current medications  Allergic rhinitis   Patient has failed these meds in past: n/a Patient is currently controlled on the following medications:  . Fexofenadine 180 mg - 90 mg BID . Fluticasone nasal spray PRN . Montelukast 10 mg - 1/2 tab BID . Azelastine nasal spray PRN  We discussed:  Patient is satisfied with current regimen and denies issues  Plan  Continue current medications  Health Maintenance   Lab Results  Component Value Date/Time   VITAMINB12 1,292 (H) 03/20/2020 03:24 PM   VD25OH 57.98 03/20/2020 03:24 PM   Patient is currently controlled on the following medications:  [name] Vitamin D 1000 IU  . Multivitamin . Thiamine 100 mg BID . Vitamin B12 500 mcg daily . Turmeric . Ciclopirox 8% solution (nail) . Prevident 1.1% dental gel  We discussed:  Patient is satisfied with current regimen and denies  issues  Plan  Continue current medications  Vaccines   Reviewed and discussed patient's vaccination history.    Immunization History  Administered Date(s) Administered  . DTP 06/14/1976, 04/22/2003, 02/27/2006  . Fluad Quad(high Dose 65+) 03/28/2019, 03/20/2020  . H1N1 04/25/2008  . Hepatitis A, Adult 01/28/2016, 08/18/2016  . Hepatitis B 11/03/1990, 12/13/1990, 04/25/1991  . Influenza Whole 03/22/2008, 03/28/2014  . Influenza-Unspecified 04/10/2012, 03/06/2015, 03/06/2018  . MMR 04/28/2006, 05/10/2006  . PFIZER SARS-COV-2 Vaccination 06/28/2019, 07/17/2019  . Pneumococcal Conjugate-13 03/20/2020  . Pneumococcal Polysaccharide-23 02/11/2011, 03/19/2019  . Td 02/27/2006  . Tdap 02/25/2017  . Typhoid Inactivated 01/28/2016  . Varicella 06/16/1994, 04/28/2006  . Zoster 12/12/2013   Plan  Pt is up-to-date with vaccines   Medication Management   Pt uses [facility] for all medications Uses pill box? Yes Pt endorses 100% compliance  We discussed: Current pharmacy is preferred with insurance plan and patient is satisfied with pharmacy services  Plan  Continue current medication management strategy    Follow up: 2 month phone visit  Charlene Brooke, PharmD, BCACP Clinical Pharmacist [facility] [phone]

## 2020-04-16 ENCOUNTER — Other Ambulatory Visit: Payer: Self-pay

## 2020-04-16 NOTE — Patient Instructions (Addendum)
Visit Information  Phone number for Pharmacist: [phone]  Thank you for meeting with me to discuss your medications! I look forward to working with you to achieve your health care goals. Below is a summary of what we talked about during the visit:  Goals Addressed            This Visit's Progress   . Pharmacy Care Plan       CARE PLAN ENTRY (see longitudinal plan of care for additional care plan information)  Current Barriers:  . Chronic Disease Management support, education, and care coordination needs related to Hypertension, Hyperlipidemia, Diabetes, Depression, and Anxiety   Hypertension BP Readings from Last 3 Encounters:  04/04/20 136/68  03/20/20 136/74  03/11/20 122/68 .  Pharmacist Clinical Goal(s): o Over the next 60 days, patient will work with PharmD and providers to maintain BP goal <130/80 . Current regimen:  o HCTZ 25 mg daily o Metoprolol tartrate 25 mg 3 times daily . Interventions: o Discussed BP goals and benefits of medications for prevention of heart attack / stroke . Patient self care activities - Over the next 90 days, patient will: o Check BP daily, document, and provide at future appointments o Ensure daily salt intake < 2300 mg/day  Hyperlipidemia Lab Results  Component Value Date/Time   LDLCALC 68 03/20/2020 03:24 PM   LDLCALC 36 06/20/2008 12:00 AM   LDLDIRECT 114 (H) 03/21/2010 01:04 AM .  Pharmacist Clinical Goal(s): o Over the next 90 days, patient will work with PharmD and providers to maintain LDL goal < 70 . Current regimen:  o Rosuvastatin 10 mg daily o Omega-3 ethyl esters 1 g twice a day o Aspirin 81 mg  . Interventions: o Discussed cholesterol goals and benefits of medications for prevention of heart attack / stroke . Patient self care activities - Over the next 90 days, patient will: o Continue current medications  Diabetes Lab Results  Component Value Date/Time   HGBA1C 7.5 (H) 03/20/2020 03:24 PM   HGBA1C 7.7 (H)  06/19/2019 01:57 PM .  Pharmacist Clinical Goal(s): o Over the next 60 days, patient will work with PharmD and providers to achieve A1c goal <7% . Current regimen:  o Metformin ER 500 mg - 3 tabs AM . Interventions: o Discussed importance of maintaining sugars at goal to prevent complications of diabetes including kidney damage, retinal damage, and cardiovascular disease o Discussed possibility of increasing metformin to 2000 mg/day or adding a GLP-1 agonist like Rybelsus (patient assistance available) . Patient self care activities - Over the next 60 days, patient will: o Continue current medications o Follow up with endocrinologist as scheduled  Depression / Anxiety . Pharmacist Clinical Goal(s) o Over the next 60 days, patient will work with PharmD and providers to optimize therapy . Current regimen:  o No medication . Interventions: o Discussed benefits of escitalopram and importance of starting SSRI at low dose . Patient self care activities - Over the next 60 days, patient will: o Consider starting escitalopram at 5 mg  Neuropathy . Pharmacist Clinical Goal(s) o Over the next 60 days, patient will work with PharmD and providers to optimize therapy . Current regimen:  o No medication . Interventions: o Discussed patient's history of intolerance to gabapentin and amitriptyline o Discussed benefits of capsaicin cream for neuropathic pain in feet . Patient self care activities - Over the next 60 days, patient will: o Try Capsaicin cream for neuropathy in feet - apply twice a day and wait 2 weeks  for full benefit  Medication management . Pharmacist Clinical Goal(s): o Over the next 60 days, patient will work with PharmD and providers to maintain optimal medication adherence . Current pharmacy: [facility] . Interventions o Comprehensive medication review performed. o Continue current medication management strategy . Patient self care activities - Over the next 60 days,  patient will: o Focus on medication adherence by fill date o Take medications as prescribed o Report any questions or concerns to PharmD and/or provider(s)  Initial goal documentation      [name] was given information about Chronic Care Management services today including:  1. CCM service includes personalized support from designated clinical staff supervised by his physician, including individualized plan of care and coordination with other care providers 2. 24/7 contact phone numbers for assistance for urgent and routine care needs. 3. Standard insurance, coinsurance, copays and deductibles apply for chronic care management only during months in which we provide at least 20 minutes of these services. Most insurances cover these services at 100%, however patients may be responsible for any copay, coinsurance and/or deductible if applicable. This service may help you avoid the need for more expensive face-to-face services. 4. Only one practitioner may furnish and bill the service in a calendar month. 5. The patient may stop CCM services at any time (effective at the end of the month) by phone call to the office staff.  Patient agreed to services and verbal consent obtained.   Patient verbalizes understanding of instructions provided today.  Telephone follow up appointment with pharmacy team member scheduled for: 2 months  Charlene Brooke, PharmD, BCACP Clinical Pharmacist [facility] [phone]  Living With Depression Everyone experiences occasional disappointment, sadness, and loss in their lives. When you are feeling down, blue, or sad for at least 2 weeks in a row, it may mean that you have depression. Depression can affect your thoughts and feelings, relationships, daily activities, and physical health. It is caused by changes in the way your brain functions. If you receive a diagnosis of depression, your health care provider will tell you which type of  depression you have and what treatment options are available to you. If you are living with depression, there are ways to help you recover from it and also ways to prevent it from coming back. How to cope with lifestyle changes Coping with stress     Stress is your body's reaction to life changes and events, both good and bad. Stressful situations may include:  Getting married.  The death of a spouse.  Losing a job.  Retiring.  Having a baby. Stress can last just a few hours or it can be ongoing. Stress can play a major role in depression, so it is important to learn both how to cope with stress and how to think about it differently. Talk with your health care provider or a counselor if you would like to learn more about stress reduction. He or she may suggest some stress reduction techniques, such as:  Music therapy. This can include creating music or listening to music. Choose music that you enjoy and that inspires you.  Mindfulness-based meditation. This kind of meditation can be done while sitting or walking. It involves being aware of your normal breaths, rather than trying to control your breathing.  Centering prayer. This is a kind of meditation that involves focusing on a spiritual word or phrase. Choose a word, phrase, or sacred image that is meaningful to you and that  brings you peace.  Deep breathing. To do this, expand your stomach and inhale slowly through your nose. Hold your breath for 3-5 seconds, then exhale slowly, allowing your stomach muscles to relax.  Muscle relaxation. This involves intentionally tensing muscles then relaxing them. Choose a stress reduction technique that fits your lifestyle and personality. Stress reduction techniques take time and practice to develop. Set aside 5-15 minutes a day to do them. Therapists can offer training in these techniques. The training may be covered by some insurance plans. Other things you can do to manage stress  include:  Keeping a stress diary. This can help you learn what triggers your stress and ways to control your response.  Understanding what your limits are and saying no to requests or events that lead to a schedule that is too full.  Thinking about how you respond to certain situations. You may not be able to control everything, but you can control how you react.  Adding humor to your life by watching funny films or TV shows.  Making time for activities that help you relax and not feeling guilty about spending your time this way.  Medicines Your health care provider may suggest certain medicines if he or she feels that they will help improve your condition. Avoid using alcohol and other substances that may prevent your medicines from working properly (may interact). It is also important to:  Talk with your pharmacist or health care provider about all the medicines that you take, their possible side effects, and what medicines are safe to take together.  Make it your goal to take part in all treatment decisions (shared decision-making). This includes giving input on the side effects of medicines. It is best if shared decision-making with your health care provider is part of your total treatment plan. If your health care provider prescribes a medicine, you may not notice the full benefits of it for 4-8 weeks. Most people who are treated for depression need to be on medicine for at least 6-12 months after they feel better. If you are taking medicines as part of your treatment, do not stop taking medicines without first talking to your health care provider. You may need to have the medicine slowly decreased (tapered) over time to decrease the risk of harmful side effects. Relationships Your health care provider may suggest family therapy along with individual therapy and drug therapy. While there may not be family problems that are causing you to feel depressed, it is still important to make sure  your family learns as much as they can about your mental health. Having your family's support can help make your treatment successful. How to recognize changes in your condition Everyone has a different response to treatment for depression. Recovery from major depression happens when you have not had signs of major depression for two months. This may mean that you will start to:  Have more interest in doing activities.  Feel less hopeless than you did 2 months ago.  Have more energy.  Overeat less often, or have better or improving appetite.  Have better concentration. Your health care provider will work with you to decide the next steps in your recovery. It is also important to recognize when your condition is getting worse. Watch for these signs:  Having fatigue or low energy.  Eating too much or too little.  Sleeping too much or too little.  Feeling restless, agitated, or hopeless.  Having trouble concentrating or making decisions.  Having unexplained physical complaints.  Feeling irritable, angry, or aggressive. Get help as soon as you or your family members notice these symptoms coming back. How to get support and help from others How to talk with friends and family members about your condition  Talking to friends and family members about your condition can provide you with one way to get support and guidance. Reach out to trusted friends or family members, explain your symptoms to them, and let them know that you are working with a health care provider to treat your depression. Financial resources Not all insurance plans cover mental health care, so it is important to check with your insurance carrier. If paying for co-pays or counseling services is a problem, search for a local or county mental health care center. They may be able to offer public mental health care services at low or no cost when you are not able to see a private health care provider. If you are taking  medicine for depression, you may be able to get the generic form, which may be less expensive. Some makers of prescription medicines also offer help to patients who cannot afford the medicines they need. Follow these instructions at home:   Get the right amount and quality of sleep.  Cut down on using caffeine, tobacco, alcohol, and other potentially harmful substances.  Try to exercise, such as walking or lifting small weights.  Take over-the-counter and prescription medicines only as told by your health care provider.  Eat a healthy diet that includes plenty of vegetables, fruits, whole grains, low-fat dairy products, and lean protein. Do not eat a lot of foods that are high in solid fats, added sugars, or salt.  Keep all follow-up visits as told by your health care provider. This is important. Contact a health care provider if:  You stop taking your antidepressant medicines, and you have any of these symptoms: ? Nausea. ? Headache. ? Feeling lightheaded. ? Chills and body aches. ? Not being able to sleep (insomnia).  You or your friends and family think your depression is getting worse. Get help right away if:  You have thoughts of hurting yourself or others. If you ever feel like you may hurt yourself or others, or have thoughts about taking your own life, get help right away. You can go to your nearest emergency department or call:  Your local emergency services (911 in the [place]  A suicide crisis helpline, such as the [place] at 561-846-3342. This is open 24-hours a day. Summary  If you are living with depression, there are ways to help you recover from it and also ways to prevent it from coming back.  Work with your health care team to create a management plan that includes counseling, stress management techniques, and healthy lifestyle habits. This information is not intended to replace advice given to you by your health care provider.  Make sure you discuss any questions you have with your health care provider. Document Revised: 09/22/2018 Document Reviewed: 05/03/2016 Elsevier Patient Education  [place].

## 2020-04-21 ENCOUNTER — Other Ambulatory Visit: Payer: Self-pay | Admitting: *Deleted

## 2020-04-22 ENCOUNTER — Other Ambulatory Visit: Payer: Self-pay

## 2020-04-22 DIAGNOSIS — E119 Type 2 diabetes mellitus without complications: Secondary | ICD-10-CM

## 2020-04-22 NOTE — Patient Outreach (Signed)
[facility] [facility]) Care Management  04/22/2020  [name] 03-04-1951 [id number]   CSW made contact with pt on 04/21/2020 who reports he had a fun weekend with family activities. He was able to spend time with his adult children, young grandkids and enjoyed the time.   Pt also shares that he feels he has  "a good routine" with his weekly [facility] visits/activiites, Art therapist and other social gatherings.  He does; however, admit to "not being happy because of lacking meaningful relationships".  CSW encouraged pt to continue to seek opportunities for this; as well as to further pursue counseling support.  Pt has received the list of in-network providers and has also done research for the 2022 benefit offerings.  Per pt, in 2022 there will be Psychologists on the list and he plans to connect with one at that time.   Pt also shared a recent medical issue/reaction to a new med. He was attentive to seeking help with this and has been taken off that particular drug.    CSW offered support and encouragement to pt.  CSW reminded pt to call if needs arise prior to follow up call later this month.   [name], MSW, [employer] Worker  [place] [phone]

## 2020-04-28 ENCOUNTER — Other Ambulatory Visit: Payer: Self-pay

## 2020-04-28 ENCOUNTER — Other Ambulatory Visit: Payer: Self-pay | Admitting: Cardiothoracic Surgery

## 2020-04-28 DIAGNOSIS — I7100 Dissection of unspecified site of aorta: Secondary | ICD-10-CM

## 2020-04-28 NOTE — Patient Outreach (Signed)
  [facility]) Care Management Chronic Special Needs Program    04/28/2020  Name: [name], DOB: 1951-06-10  MRN: [id number]   [name] is enrolled in a chronic special needs plan for Diabetes. [place] Management will continue to provide services for this member through 06/13/20.  The [employer] care management team will assume care 06/14/2020.   [name] RN,BSN,CCM [facility] Management [phone]

## 2020-04-30 ENCOUNTER — Other Ambulatory Visit: Payer: Self-pay | Admitting: Internal Medicine

## 2020-04-30 NOTE — Telephone Encounter (Signed)
Ok forward to pcp 

## 2020-05-01 ENCOUNTER — Other Ambulatory Visit: Payer: Self-pay

## 2020-05-01 ENCOUNTER — Other Ambulatory Visit: Payer: Self-pay | Admitting: Internal Medicine

## 2020-05-01 NOTE — Patient Outreach (Signed)
  [facility]) Care Management Chronic Special Needs Program    05/01/2020  Name: KYI ROMANELLO, DOB: Oct 22, 1950  MRN: [id number]   [employer] care management team has assumed care and services for this member. Case Closed by [facility] care management.   [name] RN,BSN,CCM [facility] [facility] Management [phone]

## 2020-05-06 ENCOUNTER — Ambulatory Visit: Payer: HMO | Admitting: *Deleted

## 2020-05-06 DIAGNOSIS — L821 Other seborrheic keratosis: Secondary | ICD-10-CM | POA: Diagnosis not present

## 2020-05-06 DIAGNOSIS — L570 Actinic keratosis: Secondary | ICD-10-CM | POA: Diagnosis not present

## 2020-05-06 DIAGNOSIS — L814 Other melanin hyperpigmentation: Secondary | ICD-10-CM | POA: Diagnosis not present

## 2020-05-06 DIAGNOSIS — D485 Neoplasm of uncertain behavior of skin: Secondary | ICD-10-CM | POA: Diagnosis not present

## 2020-05-06 DIAGNOSIS — L430 Hypertrophic lichen planus: Secondary | ICD-10-CM | POA: Diagnosis not present

## 2020-05-07 ENCOUNTER — Other Ambulatory Visit (HOSPITAL_COMMUNITY): Payer: Self-pay | Admitting: Internal Medicine

## 2020-05-07 ENCOUNTER — Ambulatory Visit: Payer: HMO | Attending: Internal Medicine

## 2020-05-07 DIAGNOSIS — Z23 Encounter for immunization: Secondary | ICD-10-CM

## 2020-05-07 NOTE — Progress Notes (Signed)
   Covid-19 Vaccination Clinic  Name:  [name]    MRN: [id number] DOB: 06-02-1951  05/07/2020  [name] was observed post Covid-19 immunization for 15 minutes without incident. He was provided with Vaccine Information Sheet and instruction to access the V-Safe system.   [name] was instructed to call 911 with any severe reactions post vaccine: [name] Difficulty breathing  . Swelling of face and throat  . A fast heartbeat  . A bad rash all over body  . Dizziness and weakness   Immunizations Administered    Name Date Dose VIS Date Route   Pfizer COVID-19 Vaccine 05/07/2020 12:19 PM 0.3 mL 04/02/2020 Intramuscular   Manufacturer: [place]   Lot: [id number]   NDC: [address]-4

## 2020-05-12 ENCOUNTER — Other Ambulatory Visit (HOSPITAL_COMMUNITY): Payer: Self-pay | Admitting: Dentistry

## 2020-05-12 ENCOUNTER — Other Ambulatory Visit: Payer: Self-pay | Admitting: *Deleted

## 2020-05-13 ENCOUNTER — Telehealth: Payer: Self-pay | Admitting: Pharmacist

## 2020-05-13 NOTE — Progress Notes (Addendum)
  Chart review for Chronic Care Management  Reviewed chart and insurance data for medication adherence. Patient does not have any gaps in adherence and is not in danger of failing any Medicare adherence measures. No further action required.   Wendy Poet, Clinical Pharmacist Assistant [facility]

## 2020-05-13 NOTE — Patient Outreach (Signed)
[facility]) Care Management  05/13/2020  [name] 06-Oct-1950 [id number]   CSW spoke with pt on 05/12/2020 who reports he got his Booster on his birthday with minimal side effects. He has been able to enjoy some social gatherings including Thanksgiving meal with friends/neighbors.  Pt also shares he was seen by Dermatology and a skin biopsy was done.  He continues to work on outings, socializing, relationships and being more positive/optimistic.  Pt plans to seek Psychologist appointment after the new year and is encouraged to get appointment as soon as he can once the new year benefits begin.   CSW offered support and encouragement and will reach out again prior to the end of this year.  [name], MSW, [place] Worker  [place] [phone]

## 2020-05-14 ENCOUNTER — Other Ambulatory Visit (HOSPITAL_COMMUNITY): Payer: Self-pay | Admitting: Dermatology

## 2020-05-15 DIAGNOSIS — Z7984 Long term (current) use of oral hypoglycemic drugs: Secondary | ICD-10-CM | POA: Diagnosis not present

## 2020-05-15 DIAGNOSIS — E1142 Type 2 diabetes mellitus with diabetic polyneuropathy: Secondary | ICD-10-CM | POA: Diagnosis not present

## 2020-05-15 DIAGNOSIS — E041 Nontoxic single thyroid nodule: Secondary | ICD-10-CM | POA: Diagnosis not present

## 2020-05-28 ENCOUNTER — Ambulatory Visit
Admission: RE | Admit: 2020-05-28 | Discharge: 2020-05-28 | Disposition: A | Discharge status: Home / Self Care | Payer: HMO | Source: Ambulatory Visit | Attending: Cardiothoracic Surgery | Admitting: Cardiothoracic Surgery

## 2020-05-28 ENCOUNTER — Other Ambulatory Visit: Payer: Self-pay | Admitting: Internal Medicine

## 2020-05-28 ENCOUNTER — Other Ambulatory Visit: Payer: Self-pay

## 2020-05-28 ENCOUNTER — Ambulatory Visit: Payer: HMO | Admitting: Cardiothoracic Surgery

## 2020-05-28 ENCOUNTER — Other Ambulatory Visit: Payer: Self-pay | Admitting: Vascular Surgery

## 2020-05-28 ENCOUNTER — Encounter: Payer: Self-pay | Admitting: Cardiothoracic Surgery

## 2020-05-28 VITALS — BP 140/69 | HR 60 | Resp 20 | Ht 76.0 in | Wt 226.0 lb

## 2020-05-28 DIAGNOSIS — I7100 Dissection of unspecified site of aorta: Secondary | ICD-10-CM | POA: Diagnosis not present

## 2020-05-28 DIAGNOSIS — I7101 Dissection of ascending aorta: Secondary | ICD-10-CM

## 2020-05-28 DIAGNOSIS — Z8679 Personal history of other diseases of the circulatory system: Secondary | ICD-10-CM | POA: Diagnosis not present

## 2020-05-28 DIAGNOSIS — Z9889 Other specified postprocedural states: Secondary | ICD-10-CM | POA: Diagnosis not present

## 2020-05-28 DIAGNOSIS — I714 Abdominal aortic aneurysm, without rupture: Secondary | ICD-10-CM | POA: Diagnosis not present

## 2020-05-28 DIAGNOSIS — J439 Emphysema, unspecified: Secondary | ICD-10-CM | POA: Diagnosis not present

## 2020-05-28 DIAGNOSIS — I712 Thoracic aortic aneurysm, without rupture: Secondary | ICD-10-CM | POA: Diagnosis not present

## 2020-05-28 DIAGNOSIS — I71011 Dissection of aortic arch: Secondary | ICD-10-CM

## 2020-05-28 DIAGNOSIS — I7102 Dissection of abdominal aorta: Secondary | ICD-10-CM | POA: Diagnosis not present

## 2020-05-28 MED ORDER — IOPAMIDOL (ISOVUE-370) INJECTION 76%
75.0000 mL | Freq: Once | INTRAVENOUS | Status: AC | PRN
  Administered 2020-05-28: 75 mL via INTRAVENOUS

## 2020-05-28 NOTE — Progress Notes (Signed)
PCP is Hoyt Koch, MD Referring Provider is Hoyt Koch, *  Chief Complaint  Patient presents with  . Thoracic Aortic Dissection    9 month f/u with CTA C/A/P     HPI: Patient presents for scheduled annual surgical follow-up with CTA after undergoing emergency repair of a type A ascending aortic dissection with resuspension of the aortic valve 2013.  Subsequently the patient underwent a aortobifemoral bypass graft in 2014 for an abdominal aneurysm.  Patient now is a non-smoker.  He has type 2 diabetes.  He takes metoprolol for blood pressure control.  He is followed by Dr. Johnsie Cancel and his most recent echocardiogram shows normal LV function with minimal AS/AI.  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  Reviewed the images of the CTA which show the ascending aortic repair to remain intact with a double graft technique.  The false lumen of the descending thoracic aorta is nonpatent with a total diameter of 4.2 cm.  The aortobifemoral bypass graft is widely patent with good distal flow.   Past Medical History:  Diagnosis Date  . AAA (abdominal aortic aneurysm) ([place])   . ALLERGIC RHINITIS   . Anemia    post op, treated /w Fe  . Anxiety   . Arthritis    OA, hands, - thumbs- specifically  . Brachial plexopathy    double crush injury with median nerve damage on the right dominant hand.   [name] COPD (chronic obstructive pulmonary disease) ([place])    also states he has been told that he has slight emphysema   . Dissecting aortic aneurysm, thoracic ([place]) 02/20/2012   s/p emergent surgical repair  . DM2 (diabetes mellitus, type 2) ([place])   . DVT (deep venous thrombosis) ([facility])    coumadin- x3 months , post op  . ERECTILE DYSFUNCTION, ORGANIC   . GERD (gastroesophageal reflux disease)    related to diet & anxiety   . HYPERLIPIDEMIA   . Hypertension    pt. followwed by Dr. Johnsie Cancel  . MIGRAINE HEADACHE     induced from physical , emotional stress , but sometimes occur randomly, also experiences BPPV  . OBSTRUCTIVE SLEEP APNEA 02/2008 sleep study   moderate & central- not able to tolerate CPAP   . Thyroid nodule 04/21/2015   2.3 cm on right, incidental finding on 03/2015 CT chest - f/u US ordered  . Unspecified vitamin D deficiency     Past Surgical History:  Procedure Laterality Date  . ABDOMINAL AORTIC ANEURYSM REPAIR N/A 11/01/2012   Procedure: ANEURYSM ABDOMINAL AORTIC REPAIR;  Surgeon: Mal Misty, MD;  Location: [facility] OR;  Service: Vascular;  Laterality: N/A;  Resection and Grafting of Abdominal Aortic Aneurysm,(AORTA BI ILIAC)  . Nasal fx without repair    . THORACIC AORTIC ANEURYSM REPAIR  02/20/2012   Procedure: THORACIC ASCENDING ANEURYSM REPAIR (AAA);  Surgeon: Ivin Poot, MD;  Location: [place];  Service: Open Heart Surgery;  Laterality: N/A;    Family History  Problem Relation Age of Onset  . Diabetes Mother   . Stroke Mother   . Dementia Mother   . Deep vein thrombosis Father        PE  . Varicose Veins Father   . Heart disease Maternal Grandmother   . Heart disease Maternal Uncle   . Heart disease Paternal Uncle   . Heart disease Cousin   . Lung cancer Cousin  2 cousins    Social History Social History   Tobacco Use  . Smoking status: Former Smoker    Packs/day: 0.10    Types: Cigarettes    Quit date: 02/18/2012    Years since quitting: 8.2  . Smokeless tobacco: Never Used  . Tobacco comment: 10 cigs/day, quit 02/2012 after TAA emergent repair  Vaping Use  . Vaping Use: Never used  Substance Use Topics  . Alcohol use: Yes    Alcohol/week: 0.0 standard drinks    Comment: shot of Tequila every day   . Drug use: No    Current Outpatient Medications  Medication Sig Dispense Refill  . ALPRAZolam (XANAX) 1 MG tablet TAKE 1/2 TABLET BY MOUTH 4 TIMES A DAY 60 tablet 5  . aspirin EC 81 MG tablet Take 81 mg by mouth daily with lunch.    [name] azelastine  (ASTELIN) 0.1 % nasal spray PLACE 2 SPRAYS INTO EACH NOSTRIL TWICE A DAY 30 mL 0  . bisacodyl (DULCOLAX) 10 MG suppository Place 1 suppository (10 mg total) rectally daily as needed for moderate constipation. 12 suppository 0  . calcium carbonate (TUMS EX) 750 MG chewable tablet Chew 1 tablet by mouth daily as needed for heartburn.     . cholecalciferol (VITAMIN D) 1000 UNITS tablet Take 2,000 Units by mouth daily.    . ciclopirox (PENLAC) 8 % solution Apply topically at bedtime. Apply over nail and surrounding skin. Apply daily over previous coat. After seven (7) days, may remove with alcohol and continue cycle. 6.6 mL 4  . EPINEPHRINE 0.3 mg/0.3 mL IJ SOAJ injection INJECT 0.3 MLS INTO THE MUSCLE ONCE AS DIRECTED 2 each 3  . fexofenadine (ALLEGRA) 180 MG tablet Take 90 mg by mouth 2 (two) times daily.    . fluticasone (FLONASE) 50 MCG/ACT nasal spray PLACE 2 SPRAYS INTO BOTH NOSTRILS DAILY. 16 g 2  . hydrochlorothiazide (HYDRODIURIL) 25 MG tablet Take 1 tablet (25 mg total) by mouth daily. Annual appt due in Oct must see provider for future refills 90 tablet 1  . metFORMIN (GLUCOPHAGE-XR) 500 MG 24 hr tablet Take 3 tablets (1,500 mg total) by mouth daily with breakfast. 270 tablet 3  . metoprolol tartrate (LOPRESSOR) 25 MG tablet TAKE 1 TABLET (25 MG TOTAL) BY MOUTH 3 (THREE) TIMES DAILY. 270 tablet 1  . montelukast (SINGULAIR) 10 MG tablet TAKE 1/2 TABLET BY MOUTH 2 TIMES DAILY. 90 tablet 1  . Multiple Vitamin (MULTIVITAMIN WITH MINERALS) TABS Take 1 tablet by mouth daily.    [name] omega-3 acid ethyl esters (LOVAZA) 1 g capsule Take 1 capsule (1 g total) by mouth 2 (two) times daily. Annual appt due in Oct must see provider for future refills 180 capsule 1  . OVER THE COUNTER MEDICATION Apply 1 application topically daily as needed (for acne). Oxy 10 vanishing cream    . polyethylene glycol powder (GLYCOLAX/MIRALAX) powder MIX 17G WITH LIQUID AND DRINK BY MOUTH DAILY. 527 g 5  . PREVIDENT 5000 DRY  MOUTH 1.1 % GEL dental gel APPLY THIN RIBBON AND BRUSH FOR 2 MINUTES DAILY IN PLACE OF CONVENTIONAL TOOTHPASTE  99  . rosuvastatin (CRESTOR) 10 MG tablet TAKE 1 TABLET BY MOUTH ONCE DAILY 90 tablet 1  . thiamine (VITAMIN B-1) 100 MG tablet Take 100 mg by mouth 2 (two) times daily at 10 AM and 5 PM.     . TURMERIC CURCUMIN PO Take by mouth.    . vitamin B-12 (CYANOCOBALAMIN) 500 MCG tablet Take 500-1,000  mcg by mouth daily.     . dapagliflozin propanediol (FARXIGA) 10 MG TABS tablet Take 1 tablet (10 mg total) by mouth daily before breakfast. (Patient not taking: Reported on 05/28/2020) 70 tablet 0   No current facility-administered medications for this visit.    Allergies  Allergen Reactions  . Testosterone Shortness Of Breath    Medication:Androgel Pt states trouble breathing at night  . Codeine Nausea And Vomiting  . Gabapentin     "drunk" feeling  . Ultram [Tramadol] Nausea And Vomiting  . Albuterol Other (See Comments)    Panic attack    Review of Systems  Weight stable No chest pain No edema No shortness of breath Feels somewhat depressed about his current lifestyle and socially isolated.  The patient goes to the [facility] fitness facility 2-3 times a week but avoids heavy lifting.  BP 140/69   Pulse 60   Resp 20   Ht 6\' 4"  (1.93 m)   Wt 226 lb (102.5 kg)   SpO2 97% Comment: RA  BMI 27.51 kg/m  Physical Exam      Exam    General- alert and comfortable    Neck- no JVD, no cervical adenopathy palpable, no carotid bruit   Lungs- clear without rales, wheezes   Cor- regular rate and rhythm, 2/6 AS murmur ,no gallop   Abdomen- soft, non-tender   Extremities - warm, non-tender, minimal edema   Neuro- oriented, appropriate, no focal weakness  Diagnostic Tests: CT images personally reviewed and also discussed with the patient.  Both the ascending thoracic graft and abdominal aortobifem graft are patent without any worrisome features.   Impression: Doing well  status post repair of an ascending typeA aortic dissection.  Last echo with good LV function and aortic valve function.  Cardiac status followed by Dr. Johnsie Cancel. No symptoms of angina or CHF. Plan: Continue with blood pressure control heart healthy lifestyle and annual surveillance scanning.  Patient requests future follow-up with Dr. Arvid Right after my retirement.   Len Childs, MD [facility] [phone]

## 2020-05-29 ENCOUNTER — Other Ambulatory Visit: Payer: Self-pay | Admitting: Internal Medicine

## 2020-05-29 ENCOUNTER — Ambulatory Visit: Payer: HMO | Admitting: Podiatry

## 2020-05-29 DIAGNOSIS — L989 Disorder of the skin and subcutaneous tissue, unspecified: Secondary | ICD-10-CM | POA: Diagnosis not present

## 2020-05-29 DIAGNOSIS — B351 Tinea unguium: Secondary | ICD-10-CM | POA: Diagnosis not present

## 2020-05-29 NOTE — Progress Notes (Signed)
Subjective: 69 year old male presents office today for evaluation of nail fungus in the left fifth toe as well as for callus.  He states he been doing well using the medication when he has questions.  Denies any open sores.  He has no other concerns. Denies any systemic complaints such as fevers, chills, nausea, vomiting. No acute changes since last appointment, and no other complaints at this time.   Objective: AAO x3, NAD DP/PT pulses palpable bilaterally, CRT less than 3 seconds Left fifth digit toenail is starting to allow this implant on the proximal aspect.  There is no pain in the nails no redness or drainage or any signs of infection.  Mild hyperkeratotic tissue submetatarsal without any underlying ulceration drainage or signs of infection of the left foot.  There is no open lesions identified otherwise.  No pain with calf compression, swelling, warmth, erythema  Assessment: Onychomycosis, callus  Plan: -All treatment options discussed with the patient including all alternatives, risks, complications.  -There is minimal toenail to debride today.  Would continue Penlac for now.  Discussed side effects, success rates medication as well as transmission of nail fungus and answered his questions.  Recommend moisturizer to the callus.  I debrided a small amount of tissue today but no significant callus formation is reoccurred. -Patient encouraged to call the office with any questions, concerns, change in symptoms.   Trula Slade DPM

## 2020-06-03 ENCOUNTER — Other Ambulatory Visit: Payer: Self-pay | Admitting: *Deleted

## 2020-06-03 ENCOUNTER — Ambulatory Visit: Payer: HMO | Admitting: *Deleted

## 2020-06-04 DIAGNOSIS — H04123 Dry eye syndrome of bilateral lacrimal glands: Secondary | ICD-10-CM | POA: Diagnosis not present

## 2020-06-04 DIAGNOSIS — H25813 Combined forms of age-related cataract, bilateral: Secondary | ICD-10-CM | POA: Diagnosis not present

## 2020-06-04 DIAGNOSIS — H401131 Primary open-angle glaucoma, bilateral, mild stage: Secondary | ICD-10-CM | POA: Diagnosis not present

## 2020-06-04 DIAGNOSIS — E119 Type 2 diabetes mellitus without complications: Secondary | ICD-10-CM | POA: Diagnosis not present

## 2020-06-04 DIAGNOSIS — H10413 Chronic giant papillary conjunctivitis, bilateral: Secondary | ICD-10-CM | POA: Diagnosis not present

## 2020-06-04 LAB — HM DIABETES EYE EXAM

## 2020-06-04 NOTE — Patient Outreach (Signed)
Post [facility]) Care Management  06/04/2020  [name] 08-26-1950 [id number]  CSW spoke with pt who reports he is getting in all his end of the year visits done; "I have an eye doctor appointment today".  Pt reports being able to enjoy several social gatherings to include being invited to his work (retired now) holiday party. He reports plans to get together with his family on Christmas day- he is looking forward to it.    CSW offered pt support, encouragement and reminded him to call if needs arise prior to CSW follow up call in January.   [name], MSW, [place] Worker  [name] [phone]

## 2020-06-09 ENCOUNTER — Encounter: Payer: Self-pay | Admitting: Internal Medicine

## 2020-06-16 ENCOUNTER — Telehealth: Payer: HMO

## 2020-06-16 NOTE — Chronic Care Management (AMB) (Deleted)
Chronic Care Management Pharmacy  Name: [name]  MRN: [id number] DOB: 06-Aug-1950   Chief Complaint/ HPI  [name],  70 y.o. , male presents for their Initial CCM visit with the clinical pharmacist via telephone due to COVID-19 Pandemic.  PCP : Hoyt Koch, MD Patient Care Team: Hoyt Koch, MD as PCP - General (Internal Medicine) Prescott Gum, Collier Salina, MD (Cardiothoracic Surgery) Pennie Banter, [place] (Pharmacist) Thurman Coyer, DO (Sports Medicine) Dohmeier, Asencion Partridge, MD (Neurology) Deirdre Peer, LCSW as [place], [place], [facility] as Pharmacist (Pharmacist)  Their chronic conditions include: Hypertension, Hyperlipidemia, Diabetes, Asthma, Depression, Anxiety, BPH and Allergic Rhinitis, Cerebrovascular disease, Migraine, AAA w/o rupture, OSA, peripheral neuropathy, mild cognitive impairment  Office Visits: 03/20/20 Dr Ronnald Ramp OV: chronic f/u. A1c 7.5%, added Farxiga to metformin. Elevated LFTs - advised to get Hep A and B vaccines. Referred to dermatology for scalp lesion.  Consult Visit: 05/15/20 Dr Buddy Duty (endocrine): f/u for T2DM via claims.  04/04/20 Dr Julien Nordmann (internal medicine): presented with penile burning, pt advised to stop [place] and rx'd Diflucan. UA + for glucosuria, otherwise normal.  03/11/20 Dr Johnsie Cancel (cardiology): f/u for AAA  03/05/20 Dr Brett Fairy (neurology): f/u for memory concerns. Dx pseudodementia. MOCA 28/30. Rx'd escitalopram 10 mg and referred to depression counseling.  Allergies  Allergen Reactions  . Testosterone Shortness Of Breath    Medication:Androgel Pt states trouble breathing at night  . Codeine Nausea And Vomiting  . Gabapentin     "drunk" feeling  . Ultram [Tramadol] Nausea And Vomiting  . Albuterol Other (See Comments)    Panic attack    Medications: Outpatient Encounter Medications as of 06/16/2020  Medication Sig  . ALPRAZolam (XANAX) 1 MG tablet TAKE 1/2  TABLET BY MOUTH 4 TIMES A DAY  . aspirin EC 81 MG tablet Take 81 mg by mouth daily with lunch.  [name] azelastine (ASTELIN) 0.1 % nasal spray PLACE 2 SPRAYS INTO EACH NOSTRIL TWICE A DAY  . bisacodyl (DULCOLAX) 10 MG suppository Place 1 suppository (10 mg total) rectally daily as needed for moderate constipation.  . calcium carbonate (TUMS EX) 750 MG chewable tablet Chew 1 tablet by mouth daily as needed for heartburn.   . cholecalciferol (VITAMIN D) 1000 UNITS tablet Take 2,000 Units by mouth daily.  . ciclopirox (PENLAC) 8 % solution Apply topically at bedtime. Apply over nail and surrounding skin. Apply daily over previous coat. After seven (7) days, may remove with alcohol and continue cycle.  . dapagliflozin propanediol (FARXIGA) 10 MG TABS tablet Take 1 tablet (10 mg total) by mouth daily before breakfast. (Patient not taking: Reported on 05/28/2020)  . EPINEPHRINE 0.3 mg/0.3 mL IJ SOAJ injection INJECT 0.3 MLS INTO THE MUSCLE ONCE AS DIRECTED  . fexofenadine (ALLEGRA) 180 MG tablet Take 90 mg by mouth 2 (two) times daily.  . fluticasone (FLONASE) 50 MCG/ACT nasal spray PLACE 2 SPRAYS INTO BOTH NOSTRILS DAILY.  . hydrochlorothiazide (HYDRODIURIL) 25 MG tablet TAKE 1 TABLET (25 MG TOTAL) BY MOUTH DAILY. ANNUAL APPT DUE IN OCT MUST SEE PROVIDER FOR FUTURE REFILLS  . metFORMIN (GLUCOPHAGE-XR) 500 MG 24 hr tablet TAKE 3 TABLETS (1,500 MG TOTAL) BY MOUTH DAILY WITH BREAKFAST.  . metoprolol tartrate (LOPRESSOR) 25 MG tablet TAKE 1 TABLET (25 MG TOTAL) BY MOUTH 3 (THREE) TIMES DAILY.  . montelukast (SINGULAIR) 10 MG tablet TAKE 1/2 TABLET BY MOUTH 2 TIMES DAILY.  . Multiple Vitamin (MULTIVITAMIN WITH MINERALS) TABS Take 1 tablet  by mouth daily.  [name] omega-3 acid ethyl esters (LOVAZA) 1 g capsule TAKE 1 CAPSULE (1 G TOTAL) BY MOUTH 2 (TWO) TIMES DAILY. ANNUAL APPT DUE IN OCT MUST SEE PROVIDER FOR FUTURE REFILLS  . OVER THE COUNTER MEDICATION Apply 1 application topically daily as needed (for acne). Oxy 10  vanishing cream  . polyethylene glycol powder (GLYCOLAX/MIRALAX) powder MIX 17G WITH LIQUID AND DRINK BY MOUTH DAILY.  [name] PREVIDENT 5000 DRY MOUTH 1.1 % GEL dental gel APPLY THIN RIBBON AND BRUSH FOR 2 MINUTES DAILY IN PLACE OF CONVENTIONAL TOOTHPASTE  . rosuvastatin (CRESTOR) 10 MG tablet TAKE 1 TABLET BY MOUTH ONCE DAILY  . thiamine (VITAMIN B-1) 100 MG tablet Take 100 mg by mouth 2 (two) times daily at 10 AM and 5 PM.   . TURMERIC CURCUMIN PO Take by mouth.  . vitamin B-12 (CYANOCOBALAMIN) 500 MCG tablet Take 500-1,000 mcg by mouth daily.   . [DISCONTINUED] oxyCODONE (OXY IR/ROXICODONE) 5 MG immediate release tablet Take 1 tablet (5 mg total) by mouth every 4 (four) hours as needed.   No facility-administered encounter medications on file as of 06/16/2020.    Wt Readings from Last 3 Encounters:  05/28/20 226 lb (102.5 kg)  04/04/20 228 lb 8 oz (103.6 kg)  03/20/20 227 lb (103 kg)   Lab Results  Component Value Date   CREATININE 0.88 03/20/2020   BUN 17 03/20/2020   GFR 88.97 03/20/2020   GFRNONAA >60 06/21/2018   GFRAA >60 06/21/2018   NA 138 03/20/2020   K 4.2 03/20/2020   CALCIUM 9.8 03/20/2020   CO2 30 03/20/2020   Current Diagnosis/Assessment:    Goals Addressed   None     Hypertension   BP goal is:  <130/80  Office blood pressures are  BP Readings from Last 3 Encounters:  05/28/20 140/69  04/04/20 136/68  03/20/20 136/74   Patient checks BP at home infrequently Patient home BP readings are ranging: n/a  Patient has failed these meds in the past: n/a Patient is currently controlled on the following medications:  . HCTZ 25 mg daily . Metoprolol tartrate 25 mg TID  We discussed BP goals; benefits of medications; pt started metoprolol after aortic aneurysm surgery in 2014; he does endorse generalized fatigue and believes it is related to beta blocker; of note pt also endorses depression/anxiety which can also cause fatigue; pt may benefit from lower dose of  metoprolol but wishes to discuss with prescriber  Plan  Continue current medications  Pt to discuss metoprolol dose/fatigue with prescriber  Hyperlipidemia   LDL goal < 70 Cerebrovascular disease w/o stroke  Last lipids Lab Results  Component Value Date   CHOL 135 03/20/2020   HDL 42.60 03/20/2020   LDLCALC 68 03/20/2020   LDLDIRECT 114 (H) 03/21/2010   TRIG 124.0 03/20/2020   CHOLHDL 3 03/20/2020   Hepatic Function Latest Ref Rng & Units 03/20/2020 03/19/2019 06/21/2018  Total Protein 6.0 - 8.3 g/dL 7.4 7.2 6.6  Albumin 3.5 - 5.2 g/dL 4.7 4.4 3.8  AST 0 - 37 U/L 44(H) 49(H) 47(H)  ALT 0 - 53 U/L 59(H) 61(H) 61(H)  Alk Phosphatase 39 - 117 U/L 28(L) 33(L) 28(L)  Total Bilirubin 0.2 - 1.2 mg/dL 1.7(H) 1.5(H) 1.4(H)  Bilirubin, Direct 0.0 - 0.3 mg/dL 0.3 - -    The 10-year ASCVD risk score Mikey Bussing DC Jr., et al., 2013) is: 30.1%   Values used to calculate the score:     Age: 78 years  Sex: Male     Is Non-Hispanic African American: No     Diabetic: Yes     Tobacco smoker: No     Systolic Blood Pressure: 867 mmHg     Is BP treated: Yes     HDL Cholesterol: 42.6 mg/dL     Total Cholesterol: 135 mg/dL   Patient has failed these meds in past: n/a Patient is currently controlled on the following medications:  . Rosuvastatin 10 mg daily . Lovaza 1 g BID . Aspirin 81 mg daily  We discussed: Cholesterol goals; benefits of statin for ASCVD risk reduction; pt is worried about LFTs, discussed very mild elevations and plan to continue monitoring without intervention unless LFTs become greater than 2-3x ULN.  Plan  Continue current medications  Diabetes   A1c goal <7%  Recent Relevant Labs: Lab Results  Component Value Date/Time   HGBA1C 7.5 (H) 03/20/2020 03:24 PM   HGBA1C 7.7 (H) 06/19/2019 01:57 PM   GFR 88.97 03/20/2020 03:24 PM   GFR 68.44 03/19/2019 03:58 PM   MICROALBUR 0.9 03/20/2020 03:24 PM   MICROALBUR 3.2 (H) 03/19/2019 03:58 PM    Last diabetic Eye  exam:  Lab Results  Component Value Date/Time   HMDIABEYEEXA No Retinopathy 06/04/2020 12:00 AM    Last diabetic Foot exam: No results found for: HMDIABFOOTEX   Patient has failed these meds in past: glyburide, Farxiga (mycotic infection) Patient is currently uncontrolled on the following medications: [name] Metformin ER 500 mg - 3 tabs AM  We discussed: A1c goals; potential to increase metformin to 2000 mg/day - along with lifestyle modifications this may achieve A1c < 7%.  Also discussed GLP-1, pt is interested in Rybelsus which will be available to pt via patient assistance; pt wishes to discuss potential med changes with endocrinologist at scheduled visit next month  Plan  Continue current medications  Pt to discuss potential increase metformin vs add Rybelsus with endocrinologist  Depression / Anxiety   Depression screen [facility] 2/9 04/04/2020 02/05/2020 11/15/2019  Decreased Interest _0 Down, Depressed, Hopeless _1 PHQ - 2 Score _2 Altered sleeping _3 Tired, decreased energy _4 Change in appetite 0 0 1  Feeling bad or failure about yourself  0 2 0  Trouble concentrating 0 1 1  Moving slowly or fidgety/restless 0 0 0  Suicidal thoughts 0 0 0  PHQ-9 Score _5 Difficult doing work/chores Not difficult at all Not difficult at all Somewhat difficult  Some recent data might be hidden   Patient has failed these meds in past: amitriptyline, trazodone, bupropion, fluoxetine Patient is currently uncontrolled on the following medications:  . Escitalopram 10 mg daily  - not taking . Alprazolam 1 mg - 1/2 tab up to 4 times daily  We discussed:  Pt reports he had negative experiences with SSRIs in the past; he reports taking Prozac and Wellbutrin ~20 years ago; discussed benefits of escitalopram and risk of side effects relative to older medications; pt may consider starting escitalopram when he feels more stable; encouraged him to make appt with therapist for CBT in  meantime  Plan  Continue current medications  Encouraged appt with therapist for CBT  Peripheral neuropathy   Patient has failed these meds in past: gabapentin, amitriptyline Patient is currently uncontrolled on the following medications:  . No medications  We discussed:  History of intolerance to first line options; pt has not tried  pregabalin but is worried he will have same CNS effects as with gabapentin; discussed benefits of capsaicin cream   Plan  Recommend capsaicin cream - apply BID to feet for at least 2 weeks for full benefit  Constipation   Patient has failed these meds in past: n/a Patient is currently controlled on the following medications:  [name] Miralax daily . Calcium carbonate (Tums) 750 mg PRN . Bisacodyl 10 mg suppository PRN  We discussed:  Patient is satisfied with current regimen and denies issues  Plan  Continue current medications  Allergic rhinitis   Patient has failed these meds in past: n/a Patient is currently controlled on the following medications:  . Fexofenadine 180 mg - 90 mg BID . Fluticasone nasal spray PRN . Montelukast 10 mg - 1/2 tab BID . Azelastine nasal spray PRN  We discussed:  Patient is satisfied with current regimen and denies issues  Plan  Continue current medications  Health Maintenance   Lab Results  Component Value Date/Time   VITAMINB12 1,292 (H) 03/20/2020 03:24 PM   VD25OH 57.98 03/20/2020 03:24 PM   Patient is currently controlled on the following medications:  [name] Vitamin D 1000 IU  . Multivitamin . Thiamine 100 mg BID . Vitamin B12 500 mcg daily . Turmeric . Ciclopirox 8% solution (nail) . Prevident 1.1% dental gel  We discussed:  Patient is satisfied with current regimen and denies issues  Plan  Continue current medications  Vaccines   Reviewed and discussed patient's vaccination history.    Immunization History  Administered Date(s) Administered  . DTP 06/14/1976, 04/22/2003, 02/27/2006  .  Fluad Quad(high Dose 65+) 03/28/2019, 03/20/2020  . H1N1 04/25/2008  . Hepatitis A, Adult 01/28/2016, 08/18/2016  . Hepatitis B 11/03/1990, 12/13/1990, 04/25/1991  . Influenza Whole 03/22/2008, 03/28/2014  . Influenza-Unspecified 04/10/2012, 03/06/2015, 03/06/2018  . MMR 04/28/2006, 05/10/2006  . PFIZER SARS-COV-2 Vaccination 06/28/2019, 07/17/2019, 05/07/2020  . Pneumococcal Conjugate-13 03/20/2020  . Pneumococcal Polysaccharide-23 02/11/2011, 03/19/2019  . Td 02/27/2006  . Tdap 02/25/2017  . Typhoid Inactivated 01/28/2016  . Varicella 06/16/1994, 04/28/2006  . Zoster 12/12/2013   Plan  Pt is up-to-date with vaccines   Medication Management   Pt uses [facility] for all medications Uses pill box? Yes Pt endorses 100% compliance  We discussed: Current pharmacy is preferred with insurance plan and patient is satisfied with pharmacy services  Plan  Continue current medication management strategy    Follow up: *** month phone visit  Charlene Brooke, PharmD, BCACP Clinical Pharmacist [facility] [phone]

## 2020-06-17 ENCOUNTER — Other Ambulatory Visit: Payer: Self-pay | Admitting: *Deleted

## 2020-06-17 NOTE — Patient Outreach (Signed)
[facility]) Care Management  06/17/2020  DAHLTON HINDE [birth year] [id number]   CSW spoke with pt on who reports the holidays went ok- he shared about some disappointments/frustrations that occurred within his family and friend outreach.  CSW validated his feelings and provided some self awareness and positive self talk tips for pt to try.  CSW inquired about his status with determining his choice for mental health support. Pt plans to review the list of Providers and is encouraged to call to schedule a visit.  "I don't like change".  CSW reassured pt that although change is difficult it can offer positive, beneficial change and opportunity.  CSW talked with pt about therapeutic interventions, medication, etc.  CSW encouraged pt to work on this and to set a goal to select one and make an initial appointment.  Pt states, "I can't promise you I will get it done but will try".  CSW also talked with pt about potential social engagements that he may participate with as the new year begins and he desires more social interactions and activities. CSW suggested he might research the [employer] for opportunities to volunteer and participate.    Pt agrees to follow up call set for 07/11/2020 at 11:30.  Reece Levy, MSW, LCSW Clinical Social Worker  [facility] [employer] [phone]

## 2020-07-01 ENCOUNTER — Other Ambulatory Visit: Payer: Self-pay | Admitting: Internal Medicine

## 2020-07-01 ENCOUNTER — Other Ambulatory Visit (HOSPITAL_COMMUNITY): Payer: Self-pay | Admitting: Internal Medicine

## 2020-07-11 ENCOUNTER — Other Ambulatory Visit: Payer: Self-pay | Admitting: *Deleted

## 2020-07-14 NOTE — Patient Outreach (Signed)
[facility]) Care Management  07/14/2020  [name] 07/06/1950 [id number]   LATE ENTRY  CSW spoke with pt on 07/11/2020 who reports he continues to face loneliness and feeling down/depressed.  "It's painful".  CSW allowed pt to express his feelings and emotions around being lonely, isolated, "forgotten" and offered validation and support.  Pt does report plans to see "[name] in early March" for an initial therapy appointment.  Pt also was reminded and encouraged to seek some outlets for opportunity to expand his social network and CSW will email him some agency opportunity's to consider; both for activities to participate in as well as to volunteer.  Pt appreciative of call and agrees to follow up call March 4th  [name], MSW, [place] Worker  [facility] [phone]

## 2020-07-18 ENCOUNTER — Encounter: Payer: Self-pay | Admitting: Internal Medicine

## 2020-07-22 ENCOUNTER — Other Ambulatory Visit: Payer: Self-pay

## 2020-07-22 ENCOUNTER — Ambulatory Visit (INDEPENDENT_AMBULATORY_CARE_PROVIDER_SITE_OTHER): Payer: HMO | Admitting: Internal Medicine

## 2020-07-22 ENCOUNTER — Encounter: Payer: Self-pay | Admitting: Internal Medicine

## 2020-07-22 VITALS — BP 134/68 | HR 58 | Temp 97.6°F | Resp 18 | Ht 76.0 in | Wt 225.8 lb

## 2020-07-22 DIAGNOSIS — G43B0 Ophthalmoplegic migraine, not intractable: Secondary | ICD-10-CM | POA: Diagnosis not present

## 2020-07-22 NOTE — Patient Instructions (Signed)
We will check the labs today and an MRI of the brain to check why you are having the symptoms.

## 2020-07-22 NOTE — Progress Notes (Signed)
Subjective:   Patient ID: [name], male    DOB: 1951/04/15, 70 y.o.   MRN: [id number]  HPI The patient is a 70 YO man coming in for concerns about migraine with not typical features perhaps. He had headache with confusion and was unable to use computer. He is very familiar with computer so this was not usual for him. This lasted about 30 minutes which is longer than he has had headaches in the past. He was also not making much sense during this episode and had his daughter come stay with him after one of these. He did have some left arm tingling during this as well. Typically rare migraines and now is having these often and he is concerned. Does have vascular disease and past aneurysm which was checked recently December without changes. Past carotid dopplers without blockage (02/2019) and not due for repeat until later this year. Also is seeing some moving objects that seem to appear randomly and do not stay. Has not been to eye specialist about these.   Review of Systems  Constitutional: Positive for activity change, appetite change and fatigue.  HENT: Negative.   Eyes: Positive for visual disturbance.  Respiratory: Negative for cough, chest tightness and shortness of breath.   Cardiovascular: Negative for chest pain, palpitations and leg swelling.  Gastrointestinal: Negative for abdominal distention, abdominal pain, constipation, diarrhea, nausea and vomiting.  Musculoskeletal: Negative.   Skin: Negative.   Neurological: Positive for numbness and headaches.  Psychiatric/Behavioral: Negative.     Objective:  Physical Exam Constitutional:      Appearance: He is well-developed and well-nourished.  HENT:     Head: Normocephalic and atraumatic.  Eyes:     Extraocular Movements: EOM normal.  Cardiovascular:     Rate and Rhythm: Normal rate and regular rhythm.  Pulmonary:     Effort: Pulmonary effort is normal. No respiratory distress.     Breath sounds: Normal breath sounds. No  wheezing or rales.  Abdominal:     General: Bowel sounds are normal. There is no distension.     Palpations: Abdomen is soft.     Tenderness: There is no abdominal tenderness. There is no rebound.  Musculoskeletal:        General: No edema.     Cervical back: Normal range of motion.  Skin:    General: Skin is warm and dry.  Neurological:     Mental Status: He is alert and oriented to person, place, and time. Mental status is at baseline.     Cranial Nerves: Cranial nerve deficit present.     Coordination: Coordination normal.  Psychiatric:        Mood and Affect: Mood and affect normal.     Vitals:   07/22/20 1546  BP: 134/68  Pulse: (!) 58  Resp: 18  Temp: 97.6 F (36.4 C)  TempSrc: Oral  SpO2: 97%  Weight: 225 lb 12.8 oz (102.4 kg)  Height: 6\' 4"  (1.93 m)    This visit occurred during the SARS-CoV-2 public health emergency.  Safety protocols were in place, including screening questions prior to the visit, additional usage of staff PPE, and extensive cleaning of exam room while observing appropriate contact time as indicated for disinfecting solutions.   Assessment & Plan:  Visit time 35 minutes in face to face communication with patient and coordination of care, additional 10 minutes spent in record review, coordination or care, ordering tests, communicating/referring to other healthcare professionals, documenting in medical records all on  the same day of the visit for total time 45 minutes spent on the visit.

## 2020-07-23 ENCOUNTER — Ambulatory Visit (INDEPENDENT_AMBULATORY_CARE_PROVIDER_SITE_OTHER): Payer: HMO | Admitting: *Deleted

## 2020-07-23 DIAGNOSIS — G3184 Mild cognitive impairment, so stated: Secondary | ICD-10-CM

## 2020-07-23 DIAGNOSIS — R4189 Other symptoms and signs involving cognitive functions and awareness: Secondary | ICD-10-CM

## 2020-07-23 DIAGNOSIS — F332 Major depressive disorder, recurrent severe without psychotic features: Secondary | ICD-10-CM

## 2020-07-23 LAB — COMPREHENSIVE METABOLIC PANEL
ALT: 49 U/L (ref 0–53)
AST: 39 U/L — ABNORMAL HIGH (ref 0–37)
Albumin: 4.4 g/dL (ref 3.5–5.2)
Alkaline Phosphatase: 29 U/L — ABNORMAL LOW (ref 39–117)
BUN: 22 mg/dL (ref 6–23)
CO2: 30 mEq/L (ref 19–32)
Calcium: 9.5 mg/dL (ref 8.4–10.5)
Chloride: 100 mEq/L (ref 96–112)
Creatinine, Ser: 1.04 mg/dL (ref 0.40–1.50)
GFR: 74.46 mL/min (ref >60.00)
Glucose, Bld: 175 mg/dL — ABNORMAL HIGH (ref 70–99)
Potassium: 4 mEq/L (ref 3.5–5.1)
Sodium: 138 mEq/L (ref 135–145)
Total Bilirubin: 1.3 mg/dL — ABNORMAL HIGH (ref 0.2–1.2)
Total Protein: 7.4 g/dL (ref 6.0–8.3)

## 2020-07-23 LAB — CBC
HCT: 45.8 % (ref 39.0–52.0)
Hemoglobin: 15.7 g/dL (ref 13.0–17.0)
MCHC: 34.3 g/dL (ref 30.0–36.0)
MCV: 93.1 fl (ref 78.0–100.0)
Platelets: 159 10*3/uL (ref 150.0–400.0)
RBC: 4.92 Mil/uL (ref 4.22–5.81)
RDW: 13.1 % (ref 11.5–15.5)
WBC: 7.1 10*3/uL (ref 4.0–10.5)

## 2020-07-23 LAB — T4, FREE: Free T4: 0.9 ng/dL (ref 0.60–1.60)

## 2020-07-23 LAB — TSH: TSH: 1.06 u[IU]/mL (ref 0.35–4.50)

## 2020-07-23 LAB — VITAMIN D 25 HYDROXY (VIT D DEFICIENCY, FRACTURES): VITD: 55.79 ng/mL (ref 30.00–100.00)

## 2020-07-23 NOTE — Chronic Care Management (AMB) (Signed)
Chronic Care Management    Clinical Social Work Note  07/23/2020 Name: [name] MRN: [id number] DOB: 24-Dec-1950  [name] is a 70 y.o. year old male who is a primary care patient of Sharlet Salina Real Cons, MD. The CCM team was consulted to assist the patient with chronic disease management and/or care coordination needs related to: [employer]  and and counseling and support.   Engaged with patient by telephone for follow up visit in response to provider referral for social work chronic care management and care coordination services.   Consent to Services:  The patient was given information about Chronic Care Management services, agreed to services, and gave verbal consent prior to initiation of services.  Please see initial visit note for detailed documentation.   Patient agreed to services and consent obtained.   Assessment: Review of patient past medical history, allergies, medications, and health status, including review of relevant consultants reports was performed today as part of a comprehensive evaluation and provision of chronic care management and care coordination services.     SDOH (Social Determinants of Health) assessments and interventions performed:  SDOH Interventions   Flowsheet Row Most Recent Value  SDOH Interventions   Depression Interventions/Treatment  Medication, Counseling       Advanced Directives Status: Plans to discuss during future phone encounter  CCM Care Plan  Allergies  Allergen Reactions  . Testosterone Shortness Of Breath    Medication:Androgel Pt states trouble breathing at night  . Codeine Nausea And Vomiting  . Gabapentin     "drunk" feeling  . Ultram [Tramadol] Nausea And Vomiting  . Albuterol Other (See Comments)    Panic attack    Outpatient Encounter Medications as of 07/23/2020  Medication Sig  . ALPRAZolam (XANAX) 1 MG tablet TAKE 1/2 TABLET BY MOUTH 4 TIMES A DAY  . aspirin EC 81 MG tablet Take 81 mg by  mouth daily with lunch.  [name] azelastine (ASTELIN) 0.1 % nasal spray Place 2 sprays into both nostrils 2 (two) times daily. Office visit is due  . bisacodyl (DULCOLAX) 10 MG suppository Place 1 suppository (10 mg total) rectally daily as needed for moderate constipation.  . calcium carbonate (TUMS EX) 750 MG chewable tablet Chew 1 tablet by mouth daily as needed for heartburn.   . cholecalciferol (VITAMIN D) 1000 UNITS tablet Take 2,000 Units by mouth daily.  . ciclopirox (PENLAC) 8 % solution Apply topically at bedtime. Apply over nail and surrounding skin. Apply daily over previous coat. After seven (7) days, may remove with alcohol and continue cycle.  [name] EPINEPHRINE 0.3 mg/0.3 mL IJ SOAJ injection INJECT 0.3 MLS INTO THE MUSCLE ONCE AS DIRECTED  . fexofenadine (ALLEGRA) 180 MG tablet Take 90 mg by mouth 2 (two) times daily.  . fluticasone (FLONASE) 50 MCG/ACT nasal spray PLACE 2 SPRAYS INTO BOTH NOSTRILS DAILY.  . hydrochlorothiazide (HYDRODIURIL) 25 MG tablet TAKE 1 TABLET (25 MG TOTAL) BY MOUTH DAILY. ANNUAL APPT DUE IN OCT MUST SEE PROVIDER FOR FUTURE REFILLS  . metFORMIN (GLUCOPHAGE-XR) 500 MG 24 hr tablet TAKE 3 TABLETS (1,500 MG TOTAL) BY MOUTH DAILY WITH BREAKFAST.  . metoprolol tartrate (LOPRESSOR) 25 MG tablet TAKE 1 TABLET (25 MG TOTAL) BY MOUTH 3 (THREE) TIMES DAILY.  . montelukast (SINGULAIR) 10 MG tablet TAKE 1/2 TABLET BY MOUTH 2 TIMES DAILY.  . Multiple Vitamin (MULTIVITAMIN WITH MINERALS) TABS Take 1 tablet by mouth daily.  [name] omega-3 acid ethyl esters (LOVAZA) 1 g capsule TAKE 1 CAPSULE (1  G TOTAL) BY MOUTH 2 (TWO) TIMES DAILY. ANNUAL APPT DUE IN OCT MUST SEE PROVIDER FOR FUTURE REFILLS  . OVER THE COUNTER MEDICATION Apply 1 application topically daily as needed (for acne). Oxy 10 vanishing cream  . polyethylene glycol powder (GLYCOLAX/MIRALAX) powder MIX 17G WITH LIQUID AND DRINK BY MOUTH DAILY.  [name] PREVIDENT 5000 DRY MOUTH 1.1 % GEL dental gel APPLY THIN RIBBON AND BRUSH FOR 2  MINUTES DAILY IN PLACE OF CONVENTIONAL TOOTHPASTE  . rosuvastatin (CRESTOR) 10 MG tablet TAKE 1 TABLET BY MOUTH ONCE DAILY  . thiamine (VITAMIN B-1) 100 MG tablet Take 100 mg by mouth 2 (two) times daily at 10 AM and 5 PM.   . TURMERIC CURCUMIN PO Take by mouth.  . vitamin B-12 (CYANOCOBALAMIN) 500 MCG tablet Take 500-1,000 mcg by mouth daily.   . [DISCONTINUED] oxyCODONE (OXY IR/ROXICODONE) 5 MG immediate release tablet Take 1 tablet (5 mg total) by mouth every 4 (four) hours as needed.   No facility-administered encounter medications on file as of 07/23/2020.    Patient Active Problem List   Diagnosis Date Noted  . History of aortic dissection 05/28/2020  . Genitourinary complaints 04/04/2020  . Elevated LFTs 03/20/2020  . Benign prostatic hyperplasia without lower urinary tract symptoms 03/20/2020  . Skin lesion of scalp 03/20/2020  . Pseudodementia 03/05/2020  . Cerebral microvascular disease 03/05/2020  . Idiopathic small fiber sensory neuropathy 03/05/2020  . Severe episode of recurrent major depressive disorder, without psychotic features ([place]) 03/05/2020  . Colon cancer screening 10/12/2019  . Coronary artery calcification seen on CAT scan 03/17/2018  . Ischemic brain damage 08/17/2017  . Routine general medical examination at a health care facility 07/18/2015  . Thyroid nodule 04/21/2015  . AAA (abdominal aortic aneurysm) without rupture ([place]) 04/15/2015  . MCI (mild cognitive impairment) 08/26/2014  . Benign paroxysmal positional vertigo 08/26/2014  . Peripheral neuropathy 03/21/2013  . DM2 (diabetes mellitus, type 2) ([place])   . Brachial plexopathy   . Anxiety   . Occlusion and stenosis of carotid artery without mention of cerebral infarction 04/11/2012  . AAA (abdominal aortic aneurysm) ([place]) 03/17/2012  . Vitamin D deficiency 12/17/2008  . Hyperlipidemia associated with type 2 diabetes mellitus ([place]) 12/17/2008  . OBSTRUCTIVE SLEEP APNEA 12/17/2008  . Migraine  12/17/2008  . ALLERGIC RHINITIS 12/17/2008  . Asthma 12/17/2008  . ERECTILE DYSFUNCTION, ORGANIC 12/17/2008    Conditions to be addressed/monitored:; Social Isolation and Counseling and support needs  Care Plan : LCSW Plan of Care  Updates made by Deirdre Peer, LCSW since 07/23/2020 12:00 AM    Problem: Need for patient centered plan of care to promote improved quality of life   Priority: High    Long-Range Goal: Develop self management plan to improve quality of life   Start Date: 07/23/2020  Expected End Date: 11/11/2020  This Visit's Progress: On track  Priority: High  Note:    Current barriers:   . Limited self health management plan in place  to address perceived decline in quality of life related to retirement, COVID isolation, etc. . Negative thoughts and feelings around self, relationships and isolation. Clinical Goal(s): patient will work with SW to reduce or manage symptoms related to feelings of decrease in quality of life.  Clinical Interventions:  . Assessed patient's previous treatment, needs, coping skills, current treatment, support system and barriers to care . Patient interviewed and appropriate assessments performed or reviewed: PHQ 9;Suicidal Ideation/Homicidal Ideation: No . Discussed several options for long term counseling  based on need and insurance. Assisted patient with narrowing the options down to in-network providers for his review and selection. . Reviewed medications with patient prescribed by PCP and discussed adherence  . Other interventions include: Motivational Interviewing, Solution-Focused Strategies, Mindfulness or Psychologist, educational, Emotional/Supportive Counseling, and Problem Solving  . Collaboration with PCP regarding development and update of comprehensive plan of care as evidenced by provider attestation and co-signature . Inter-disciplinary care team collaboration (see longitudinal plan of care) Patient Goals/Self-Care Activities:  -  attend initial counseling visit in March - call and visit an old co-worker - check out volunteer opportunities provided - perform a random act of kindness - practice positive self thoughts - continue social outings to [employer] and senior center engaging with others   Follow Up Plan: 08/15/2020 11:30 am      Follow Up Plan: Appointment scheduled for SW follow up with client by phone on: 08/15/2020 at 11:30am      Ludwig Clarks, LCSW

## 2020-07-23 NOTE — Patient Instructions (Signed)
Visit Information  PATIENT GOALS: Goals Addressed            This Visit's Progress   . Continue to work with Education officer, museum       Timeframe:  Long-Range Goal Priority:  High Start Date:  07/22/2020                          Expected End Date:     11/11/2020                  Follow Up Date 08/15/2020    - attend initial counseling visit in March - call and visit an old co-worker - check out volunteer opportunities provided - perform a random act of kindness - practice positive self thoughts - continue social outings to [employer] and senior center engaging with others    Why is this important?    When you are stressed, down or upset, your body reacts too.   For example, your blood pressure may get higher; you may have a headache or stomachache.   When your emotions get the best of you, your body's ability to fight off cold and flu gets weak.   These steps will help you manage your emotions.     Notes:        Patient verbalizes understanding of instructions provided today and agrees to view in [place].   Telephone follow up appointment with care management team member scheduled for: 08/15/2020 11:30 am  Signature Ludwig Clarks, LCSW

## 2020-07-24 ENCOUNTER — Encounter: Payer: Self-pay | Admitting: Internal Medicine

## 2020-07-24 NOTE — Assessment & Plan Note (Signed)
Concern for TIA versus complicated migraine with this new episode of confusion and tingling lasting about 30 minutes. He does already take aspirin 81 mg daily. Is on statin. Checking labs to assess for metabolic cause with CBC, CMP, thyroid and vitamin d.

## 2020-07-25 ENCOUNTER — Encounter (INDEPENDENT_AMBULATORY_CARE_PROVIDER_SITE_OTHER): Payer: Self-pay

## 2020-07-25 ENCOUNTER — Telehealth: Payer: Self-pay | Admitting: Internal Medicine

## 2020-07-25 ENCOUNTER — Ambulatory Visit: Payer: HMO | Admitting: Pharmacist

## 2020-07-25 DIAGNOSIS — E1140 Type 2 diabetes mellitus with diabetic neuropathy, unspecified: Secondary | ICD-10-CM | POA: Diagnosis not present

## 2020-07-25 DIAGNOSIS — E785 Hyperlipidemia, unspecified: Secondary | ICD-10-CM | POA: Diagnosis not present

## 2020-07-25 DIAGNOSIS — F332 Major depressive disorder, recurrent severe without psychotic features: Secondary | ICD-10-CM | POA: Diagnosis not present

## 2020-07-25 DIAGNOSIS — E1169 Type 2 diabetes mellitus with other specified complication: Secondary | ICD-10-CM

## 2020-07-25 DIAGNOSIS — G43B0 Ophthalmoplegic migraine, not intractable: Secondary | ICD-10-CM

## 2020-07-25 DIAGNOSIS — F419 Anxiety disorder, unspecified: Secondary | ICD-10-CM

## 2020-07-25 NOTE — Progress Notes (Signed)
Chronic Care Management Pharmacy Note  07/28/2020 Name:  [name] MRN:  [id number] DOB:  [birth year]  Subjective: [name] is an 70 y.o. year old male who is a primary patient of Hoyt Koch, MD.  The CCM team was consulted for assistance with disease management and care coordination needs.    Engaged with patient by telephone for follow up visit in response to provider referral for pharmacy case management and/or care coordination services.   Consent to Services:  The patient was given the following information about Chronic Care Management services today, agreed to services, and gave verbal consent: 1. CCM service includes personalized support from designated clinical staff supervised by the primary care provider, including individualized plan of care and coordination with other care providers 2. 24/7 contact phone numbers for assistance for urgent and routine care needs. 3. Service will only be billed when office clinical staff spend 20 minutes or more in a month to coordinate care. 4. Only one practitioner may furnish and bill the service in a calendar month. 5.The patient may stop CCM services at any time (effective at the end of the month) by phone call to the office staff. 6. The patient will be responsible for cost sharing (co-pay) of up to 20% of the service fee (after annual deductible is met). Patient agreed to services and consent obtained.  Patient Care Team: Hoyt Koch, MD as PCP - General (Internal Medicine) Prescott Gum, Collier Salina, MD (Inactive) (Cardiothoracic Surgery) Pennie Banter, [place] (Pharmacist) Thurman Coyer, DO (Sports Medicine) Dohmeier, Asencion Partridge, MD (Neurology) Deirdre Peer, LCSW as Social Worker (Licensed Clinical Social Worker) Lenord Fellers, Cleaster Corin, [facility] as Pharmacist (Pharmacist)  Recent office visits: 07/22/20 Dr Sharlet Salina OV: c/o migraine w/ atypical features. Concern for TIA vs migraine. Ordered labs and MRI brain. Labs  normal. DC'd Farxiga due to pt not taking  03/20/20 Dr Ronnald Ramp OV: chronic f/u. A1c 7.5%, added Farxiga to metformin. Elevated LFTs - advised to get Hep A and B vaccines. Referred to dermatology for scalp lesion.  Recent consult visits: 05/28/20 Dr Prescott Gum (CT surgery): s/p repair of AA dissection, doing well. No changes. Dc'd lexapro d/t pt not taking.  04/04/20 Dr Julien Nordmann (internal medicine): presented with penile burning, pt advised to stop [place] and rx'd Diflucan. UA + for glucosuria, otherwise normal.  03/11/20 Dr Johnsie Cancel (cardiology): f/u for AAA  03/05/20 Dr Brett Fairy (neurology): f/u for memory concerns. Dx pseudodementia. MOCA 28/30. Rx'd escitalopram 10 mg and referred to depression counseling.  Hospital visits: None in previous 6 months  Objective:  Lab Results  Component Value Date   CREATININE 1.04 07/22/2020   BUN 22 07/22/2020   GFR 74.46 07/22/2020   GFRNONAA >60 06/21/2018   GFRAA >60 06/21/2018   NA 138 07/22/2020   K 4.0 07/22/2020   CALCIUM 9.5 07/22/2020   CO2 30 07/22/2020    Lab Results  Component Value Date/Time   HGBA1C 7.5 (H) 03/20/2020 03:24 PM   HGBA1C 7.7 (H) 06/19/2019 01:57 PM   GFR 74.46 07/22/2020 04:22 PM   GFR 88.97 03/20/2020 03:24 PM   MICROALBUR 0.9 03/20/2020 03:24 PM   MICROALBUR 3.2 (H) 03/19/2019 03:58 PM    Last diabetic Eye exam:  Lab Results  Component Value Date/Time   HMDIABEYEEXA No Retinopathy 06/04/2020 12:00 AM    Last diabetic Foot exam: No results found for: HMDIABFOOTEX   Lab Results  Component Value Date   CHOL 135 03/20/2020   HDL 42.60 03/20/2020  LDLCALC 68 03/20/2020   LDLDIRECT 114 (H) 03/21/2010   TRIG 124.0 03/20/2020   CHOLHDL 3 03/20/2020    Hepatic Function Latest Ref Rng & Units 07/22/2020 03/20/2020 03/19/2019  Total Protein 6.0 - 8.3 g/dL 7.4 7.4 7.2  Albumin 3.5 - 5.2 g/dL 4.4 4.7 4.4  AST 0 - 37 U/L 39(H) 44(H) 49(H)  ALT 0 - 53 U/L 49 59(H) 61(H)  Alk Phosphatase 39 - 117 U/L 29(L) 28(L)  33(L)  Total Bilirubin 0.2 - 1.2 mg/dL 1.3(H) 1.7(H) 1.5(H)  Bilirubin, Direct 0.0 - 0.3 mg/dL - 0.3 -    Lab Results  Component Value Date/Time   TSH 1.06 07/22/2020 04:22 PM   TSH 0.84 03/19/2019 03:58 PM   FREET4 0.90 07/22/2020 04:22 PM   FREET4 0.91 03/19/2019 03:58 PM    CBC Latest Ref Rng & Units 07/22/2020 03/20/2020 03/19/2019  WBC 4.0 - 10.5 K/uL 7.1 6.5 6.3  Hemoglobin 13.0 - 17.0 g/dL 15.7 15.8 15.2  Hematocrit 39.0 - 52.0 % 45.8 45.5 45.3  Platelets 150.0 - 400.0 K/uL 159.0 152.0 157.0    Lab Results  Component Value Date/Time   VD25OH 55.79 07/22/2020 04:22 PM   VD25OH 57.98 03/20/2020 03:24 PM    Clinical ASCVD: Yes  The 10-year ASCVD risk score Mikey Bussing DC Jr., et al., 2013) is: 28.2%   Values used to calculate the score:     Age: 76 years     Sex: Male     Is Non-Hispanic African American: No     Diabetic: Yes     Tobacco smoker: No     Systolic Blood Pressure: 354 mmHg     Is BP treated: Yes     HDL Cholesterol: 42.6 mg/dL     Total Cholesterol: 135 mg/dL    Depression screen [facility] 2/9 07/23/2020 07/22/2020 04/04/2020  Decreased Interest 3 0 1  Down, Depressed, Hopeless 3 0 1  PHQ - 2 Score 6 0 2  Altered sleeping 3 0 1  Tired, decreased energy 3 0 1  Change in appetite 1 0 0  Feeling bad or failure about yourself  1 0 0  Trouble concentrating 3 0 0  Moving slowly or fidgety/restless 0 0 0  Suicidal thoughts 2 0 0  PHQ-9 Score 19 0 4  Difficult doing work/chores Not difficult at all - Not difficult at all  Some recent data might be hidden     Social History   Tobacco Use  Smoking Status Former Smoker  . Packs/day: 0.10  . Types: Cigarettes  . Quit date: 02/18/2012  . Years since quitting: 8.4  Smokeless Tobacco Never Used  Tobacco Comment   10 cigs/day, quit 02/2012 after TAA emergent repair   BP Readings from Last 3 Encounters:  07/22/20 134/68  05/28/20 140/69  04/04/20 136/68   Pulse Readings from Last 3 Encounters:  07/22/20 (!) 58   05/28/20 60  04/04/20 67   Wt Readings from Last 3 Encounters:  07/22/20 225 lb 12.8 oz (102.4 kg)  05/28/20 226 lb (102.5 kg)  04/04/20 228 lb 8 oz (103.6 kg)    Assessment/Interventions: Review of patient past medical history, allergies, medications, health status, including review of consultants reports, laboratory and other test data, was performed as part of comprehensive evaluation and provision of chronic care management services.   SDOH:  (Social Determinants of Health) assessments and interventions performed: Yes   CCM Care Plan  Allergies  Allergen Reactions  . Testosterone Shortness Of Breath  Medication:Androgel Pt states trouble breathing at night  . Codeine Nausea And Vomiting  . Gabapentin     "drunk" feeling  . Ultram [Tramadol] Nausea And Vomiting  . Albuterol Other (See Comments)    Panic attack    Medications Reviewed Today    Reviewed by Charlton Haws, [facility] (Pharmacist) on 07/28/20 at 1521  Med List Status: <None>  Medication Order Taking? Sig Documenting Provider Last Dose Status Informant  ALPRAZolam (XANAX) 1 MG tablet [id number] Yes TAKE 1/2 TABLET BY MOUTH 4 TIMES A DAY Hoyt Koch, MD Taking Active   aspirin EC 81 MG tablet [id number] Yes Take 81 mg by mouth daily with lunch. [provider] Taking Active Self  azelastine (ASTELIN) 0.1 % nasal spray [id number] Yes Place 2 sprays into both nostrils 2 (two) times daily. Office visit is due Hoyt Koch, MD Taking Active   bisacodyl (DULCOLAX) 10 MG suppository [id number] Yes Place 1 suppository (10 mg total) rectally daily as needed for moderate constipation. Lysbeth Penner, FNP Taking Active   calcium carbonate (TUMS EX) 750 MG chewable tablet [id number] Yes Chew 1 tablet by mouth daily as needed for heartburn.  [provider] Taking Active Self  cholecalciferol (VITAMIN D) 1000 UNITS tablet [id number] Yes Take 2,000 Units by mouth daily. Rowe Clack, MD  Taking Active Self  ciclopirox [facility]) 8 % solution [id number] Yes Apply topically at bedtime. Apply over nail and surrounding skin. Apply daily over previous coat. After seven (7) days, may remove with alcohol and continue cycle. Trula Slade, DPM Taking Active   EPINEPHRINE 0.3 mg/0.3 mL IJ SOAJ injection [id number] Yes INJECT 0.3 MLS INTO THE MUSCLE ONCE AS DIRECTED Hoyt Koch, MD Taking Active   fexofenadine (ALLEGRA) 180 MG tablet [id number] Yes Take 90 mg by mouth 2 (two) times daily. [provider] Taking Active Self  fluticasone (FLONASE) 50 MCG/ACT nasal spray [id number] Yes PLACE 2 SPRAYS INTO BOTH NOSTRILS DAILY. Hoyt Koch, MD Taking Active   hydrochlorothiazide (HYDRODIURIL) 25 MG tablet [id number] Yes TAKE 1 TABLET (25 MG TOTAL) BY MOUTH DAILY. ANNUAL APPT DUE IN OCT MUST SEE PROVIDER FOR FUTURE REFILLS Hoyt Koch, MD Taking Active   metFORMIN (GLUCOPHAGE-XR) 500 MG 24 hr tablet [id number] Yes TAKE 3 TABLETS (1,500 MG TOTAL) BY MOUTH DAILY WITH BREAKFAST. Hoyt Koch, MD Taking Active   metoprolol tartrate (LOPRESSOR) 25 MG tablet [id number] Yes TAKE 1 TABLET (25 MG TOTAL) BY MOUTH 3 (THREE) TIMES DAILY. Josue Hector, MD Taking Active   montelukast (SINGULAIR) 10 MG tablet [id number] Yes TAKE 1/2 TABLET BY MOUTH 2 TIMES DAILY. Hoyt Koch, MD Taking Active   Multiple Vitamin (MULTIVITAMIN WITH MINERALS) TABS [id number] Yes Take 1 tablet by mouth daily. [provider] Taking Active Self  omega-3 acid ethyl esters (LOVAZA) 1 g capsule [id number] Yes TAKE 1 CAPSULE (1 G TOTAL) BY MOUTH 2 (TWO) TIMES DAILY. ANNUAL APPT DUE IN OCT MUST SEE PROVIDER FOR FUTURE REFILLS Hoyt Koch, MD Taking Active   OVER THE COUNTER MEDICATION [id number] Yes Apply 1 application topically daily as needed (for acne). Oxy 10 vanishing cream [provider] Taking Active Self        Discontinued 12/05/12 1213 (Error)             Med Note Gar Ponto   Tue Mar 11, 2020  3:29 PM)    polyethylene glycol powder (GLYCOLAX/MIRALAX) powder [id number] Yes MIX 17G WITH LIQUID AND DRINK BY  MOUTH DAILY. Hoyt Koch, MD Taking Active   PREVIDENT 5000 DRY MOUTH 1.1 % GEL dental gel [id number] Yes APPLY THIN RIBBON AND BRUSH FOR 2 MINUTES DAILY IN PLACE OF CONVENTIONAL TOOTHPASTE [provider] Taking Active   rosuvastatin (CRESTOR) 10 MG tablet [id number] Yes TAKE 1 TABLET BY MOUTH ONCE DAILY Hoyt Koch, MD Taking Active   thiamine (VITAMIN B-1) 100 MG tablet [id number] Yes Take 100 mg by mouth 2 (two) times daily at 10 AM and 5 PM.  [provider] Taking Active Self  TURMERIC CURCUMIN PO [id number] Yes Take by mouth. [provider] Taking Active   vitamin B-12 (CYANOCOBALAMIN) 500 MCG tablet [id number] Yes Take 500-1,000 mcg by mouth daily.  [provider] Taking Active Self          Patient Active Problem List   Diagnosis Date Noted  . History of aortic dissection 05/28/2020  . Genitourinary complaints 04/04/2020  . Elevated LFTs 03/20/2020  . Benign prostatic hyperplasia without lower urinary tract symptoms 03/20/2020  . Skin lesion of scalp 03/20/2020  . Pseudodementia 03/05/2020  . Cerebral microvascular disease 03/05/2020  . Idiopathic small fiber sensory neuropathy 03/05/2020  . Severe episode of recurrent major depressive disorder, without psychotic features ([place]) 03/05/2020  . Colon cancer screening 10/12/2019  . Coronary artery calcification seen on CAT scan 03/17/2018  . Ischemic brain damage 08/17/2017  . Routine general medical examination at a health care facility 07/18/2015  . Thyroid nodule 04/21/2015  . AAA (abdominal aortic aneurysm) without rupture ([place]) 04/15/2015  . MCI (mild cognitive impairment) 08/26/2014  . Benign paroxysmal positional vertigo 08/26/2014  . Peripheral neuropathy 03/21/2013  . DM2 (diabetes mellitus, type 2) ([place])   .  Brachial plexopathy   . Anxiety   . Occlusion and stenosis of carotid artery without mention of cerebral infarction 04/11/2012  . AAA (abdominal aortic aneurysm) ([place]) 03/17/2012  . Vitamin D deficiency 12/17/2008  . Hyperlipidemia associated with type 2 diabetes mellitus ([place]) 12/17/2008  . OBSTRUCTIVE SLEEP APNEA 12/17/2008  . Migraine 12/17/2008  . ALLERGIC RHINITIS 12/17/2008  . Asthma 12/17/2008  . ERECTILE DYSFUNCTION, ORGANIC 12/17/2008    Immunization History  Administered Date(s) Administered  . DTP 06/14/1976, 04/22/2003, 02/27/2006  . Fluad Quad(high Dose 65+) 03/28/2019, 03/20/2020  . H1N1 04/25/2008  . Hepatitis A, Adult 01/28/2016, 08/18/2016  . Hepatitis B 11/03/1990, 12/13/1990, 04/25/1991  . Influenza Split 03/15/2017, 03/27/2019  . Influenza Whole 03/22/2008, 03/28/2014  . Influenza-Unspecified 04/10/2012, 03/06/2015, 03/06/2018  . MMR 04/28/2006, 05/10/2006  . PFIZER(Purple Top)SARS-COV-2 Vaccination 06/28/2019, 07/17/2019, 05/07/2020  . Pneumococcal Conjugate-13 03/20/2020  . Pneumococcal Polysaccharide-23 02/11/2011, 03/19/2019  . Td 02/27/2006  . Tdap 02/25/2017  . Typhoid Inactivated 01/28/2016  . Varicella 06/16/1994, 04/28/2006  . Zoster 12/12/2013    Conditions to be addressed/monitored:  Hypertension, Hyperlipidemia, Diabetes, Depression, Anxiety and Neuropathy  Care Plan : [place]  Updates made by Charlton Haws, [place] since 07/28/2020 12:00 AM    Problem: Hypertension, Hyperlipidemia, Diabetes, Depression, Anxiety and Neuropathy   Priority: High    Long-Range Goal: Disease management   Start Date: 07/28/2020  Expected End Date: 01/25/2021  This Visit's Progress: On track  Priority: High  Note:   Current Barriers:  . Unable to independently monitor therapeutic efficacy . Suboptimal therapeutic regimen for anxiety  Pharmacist Clinical Goal(s):  [name] Over the next 90 days, patient will achieve adherence to monitoring  guidelines and medication adherence to achieve therapeutic efficacy . adhere to plan to optimize  therapeutic regimen for anxiety as evidenced by report of adherence to recommended medication management changes through collaboration with PharmD and provider.   Interventions: . 1:1 collaboration with Hoyt Koch, MD regarding development and update of comprehensive plan of care as evidenced by provider attestation and co-signature . Inter-disciplinary care team collaboration (see longitudinal plan of care) . Comprehensive medication review performed; medication list updated in electronic medical record  Hypertension (BP goal < 130/80) Current regimen:  ? HCTZ 25 mg daily ? Metoprolol tartrate 25 mg 3 times daily Interventions: ? Discussed BP goals and benefits of medications for prevention of heart attack / stroke Patient self care activities ? Check BP daily, document, and provide at future appointments ? Ensure daily salt intake < 2300 mg/day   Hyperlipidemia (LDL goal < 70) Current regimen:  ? Rosuvastatin 10 mg daily ? Omega-3 ethyl esters 1 g twice a day ? Aspirin 81 mg  Interventions: ? Discussed cholesterol goals and benefits of medications for prevention of heart attack / stroke Patient self care activities  ? Continue current medications   Diabetes (A1c goal < 7%) Current regimen:  ? Metformin ER 500 mg - 3 tabs AM Interventions: ? Discussed importance of maintaining sugars at goal to prevent complications of diabetes including kidney damage, retinal damage, and cardiovascular disease ? Discussed possibility of increasing metformin to 2000 mg/day or adding a GLP-1 agonist like Rybelsus (patient assistance available) Patient self care activities  ? Continue current medications ? Follow up with endocrinologist as scheduled   Anxiety / Depression Uncontrolled - pt reports feeling down lately, but he has agreed to see a therapist in March. He defers starting a  medication today. Current regimen:  ? Alprazolam 1 mg PRN Previously tried amitriptyline, fluoxetine, bupropion, trazodone Interventions: ? Discussed benefits of escitalopram and importance of starting SSRI at low dose. Pt declined starting a medication at this time due to concern over how it will effect him Patient self care activities  ? Consider starting escitalopram at 5 mg ? Attend initial counseling visit in March   Neuropathy Current regimen:  ? No medication Interventions: ? Discussed patient's history of intolerance to gabapentin and amitriptyline ? Discussed benefits of capsaicin cream for neuropathic pain in feet Patient self care activities  ? Try Capsaicin cream for neuropathy in feet - apply twice a day and wait 2 weeks for full benefit   Migraine / ? TIA . Interventions: o Discussed importance of upcoming MRI to determine presence of past strokes o Discussed migraine therapy can be limited by presence of past strokes or cardiovascular disease o Discussed triptans (treatment of migranes) vs CGRP antagonists (prevention of migraines) . Patient self care activities o Keep MRI appointment as scheduled  Patient Goals/Self-Care Activities . Over the next 90 days, patient will:  - take medications as prescribed  - may consider medication to help with anxiety / mood  Follow Up Plan: Telephone follow up appointment with care management team member scheduled for: 3 months      Medication Assistance: None required.  Patient affirms current coverage meets needs.  Patient's preferred pharmacy is:  [facility], [place] - [address] [place] [address] Phone: [phone] Fax: [phone]  Uses pill box? Yes Pt endorses 100% compliance  We discussed: Current pharmacy is preferred with insurance plan and patient is satisfied with pharmacy services Patient decided to: Continue current  medication management strategy  Care  Plan and Follow Up Patient Decision:  Patient agrees to Care Plan and Follow-up.  Plan: Telephone follow up appointment with care management team member scheduled for:  3 months  Charlene Brooke, PharmD, [facility] Clinical Pharmacist [facility] at [facility] [phone]

## 2020-07-25 NOTE — Telephone Encounter (Signed)
Patient called and is requesting a call back. He can be reached at [phone]

## 2020-07-28 ENCOUNTER — Other Ambulatory Visit: Payer: Self-pay

## 2020-07-28 NOTE — Patient Instructions (Addendum)
Visit Information  Phone number for Pharmacist: [phone]  Goals Addressed   None    Patient Care Plan: CCM Pharmacy Care Plan    Problem Identified: Hypertension, Hyperlipidemia, Diabetes, Depression, Anxiety and Neuropathy   Priority: High    Long-Range Goal: Disease management   Start Date: 07/28/2020  Expected End Date: 01/25/2021  This Visit's Progress: On track  Priority: High  Note:   Current Barriers:  . Unable to independently monitor therapeutic efficacy . Suboptimal therapeutic regimen for anxiety  Pharmacist Clinical Goal(s):  [name] Over the next 90 days, patient will achieve adherence to monitoring guidelines and medication adherence to achieve therapeutic efficacy . adhere to plan to optimize therapeutic regimen for anxiety as evidenced by report of adherence to recommended medication management changes through collaboration with PharmD and provider.   Interventions: . 1:1 collaboration with Hoyt Koch, MD regarding development and update of comprehensive plan of care as evidenced by provider attestation and co-signature . Inter-disciplinary care team collaboration (see longitudinal plan of care) . Comprehensive medication review performed; medication list updated in electronic medical record  Hypertension (BP goal < 130/80) Current regimen:  ? HCTZ 25 mg daily ? Metoprolol tartrate 25 mg 3 times daily Interventions: ? Discussed BP goals and benefits of medications for prevention of heart attack / stroke Patient self care activities ? Check BP daily, document, and provide at future appointments ? Ensure daily salt intake < 2300 mg/day   Hyperlipidemia (LDL goal < 70) Current regimen:  ? Rosuvastatin 10 mg daily ? Omega-3 ethyl esters 1 g twice a day ? Aspirin 81 mg  Interventions: ? Discussed cholesterol goals and benefits of medications for prevention of heart attack / stroke Patient self care activities  ? Continue current medications    Diabetes (A1c goal < 7%) Current regimen:  ? Metformin ER 500 mg - 3 tabs AM Interventions: ? Discussed importance of maintaining sugars at goal to prevent complications of diabetes including kidney damage, retinal damage, and cardiovascular disease ? Discussed possibility of increasing metformin to 2000 mg/day or adding a GLP-1 agonist like Rybelsus (patient assistance available) Patient self care activities  ? Continue current medications ? Follow up with endocrinologist as scheduled   Anxiety / Depression Uncontrolled - pt reports feeling down lately, but he has agreed to see a therapist in March. He defers starting a medication today. Current regimen:  ? Alprazolam 1 mg PRN Previously tried amitriptyline, fluoxetine, bupropion, trazodone Interventions: ? Discussed benefits of escitalopram and importance of starting SSRI at low dose. Pt declined starting a medication at this time due to concern over how it will effect him Patient self care activities  ? Consider starting escitalopram at 5 mg ? Attend initial counseling visit in March   Neuropathy Current regimen:  ? No medication Interventions: ? Discussed patient's history of intolerance to gabapentin and amitriptyline ? Discussed benefits of capsaicin cream for neuropathic pain in feet Patient self care activities  ? Try Capsaicin cream for neuropathy in feet - apply twice a day and wait 2 weeks for full benefit   Migraine / ? TIA . Interventions: o Discussed importance of upcoming MRI to determine presence of past strokes o Discussed migraine therapy can be limited by presence of past strokes or cardiovascular disease o Discussed triptans (treatment of migranes) vs CGRP antagonists (prevention of migraines) . Patient self care activities o Keep MRI appointment as scheduled  Patient Goals/Self-Care Activities . Over the next 90 days, patient will:  - take medications  as prescribed  - may consider medication to help  with anxiety / mood  Follow Up Plan: Telephone follow up appointment with care management team member scheduled for: 3 months      The patient verbalized understanding of instructions, educational materials, and care plan provided today and declined offer to receive copy of patient instructions, educational materials, and care plan.  Telephone follow up appointment with pharmacy team member scheduled for: 3 months  Charlene Brooke, PharmD, [facility] Clinical Pharmacist [place] [facility] at [facility] [phone]

## 2020-07-29 DIAGNOSIS — L920 Granuloma annulare: Secondary | ICD-10-CM | POA: Diagnosis not present

## 2020-07-29 DIAGNOSIS — L821 Other seborrheic keratosis: Secondary | ICD-10-CM | POA: Diagnosis not present

## 2020-07-29 DIAGNOSIS — L918 Other hypertrophic disorders of the skin: Secondary | ICD-10-CM | POA: Diagnosis not present

## 2020-07-29 DIAGNOSIS — L720 Epidermal cyst: Secondary | ICD-10-CM | POA: Diagnosis not present

## 2020-07-29 DIAGNOSIS — L738 Other specified follicular disorders: Secondary | ICD-10-CM | POA: Diagnosis not present

## 2020-08-14 DIAGNOSIS — F411 Generalized anxiety disorder: Secondary | ICD-10-CM | POA: Diagnosis not present

## 2020-08-15 ENCOUNTER — Ambulatory Visit: Payer: Self-pay | Admitting: *Deleted

## 2020-08-16 ENCOUNTER — Ambulatory Visit
Admission: RE | Admit: 2020-08-16 | Discharge: 2020-08-16 | Disposition: A | Discharge status: Home / Self Care | Payer: HMO | Source: Ambulatory Visit | Attending: Internal Medicine | Admitting: Internal Medicine

## 2020-08-16 ENCOUNTER — Other Ambulatory Visit: Payer: Self-pay

## 2020-08-16 DIAGNOSIS — I616 Nontraumatic intracerebral hemorrhage, multiple localized: Secondary | ICD-10-CM | POA: Diagnosis not present

## 2020-08-16 DIAGNOSIS — I639 Cerebral infarction, unspecified: Secondary | ICD-10-CM | POA: Diagnosis not present

## 2020-08-16 DIAGNOSIS — J3489 Other specified disorders of nose and nasal sinuses: Secondary | ICD-10-CM | POA: Diagnosis not present

## 2020-08-16 DIAGNOSIS — G43B0 Ophthalmoplegic migraine, not intractable: Secondary | ICD-10-CM

## 2020-08-16 DIAGNOSIS — G43909 Migraine, unspecified, not intractable, without status migrainosus: Secondary | ICD-10-CM | POA: Diagnosis not present

## 2020-08-18 ENCOUNTER — Telehealth: Payer: Self-pay | Admitting: *Deleted

## 2020-08-18 NOTE — Telephone Encounter (Signed)
CSW attempted to call pt 08/15/2020 at scheduled time and was unable to reach. CSW left a HIPPA compliant voice message and will await callback or try again 08/21/2020. [name] MSW, LCSW Licensed Clinical Social Worker [facility] [place] [phone]

## 2020-08-20 ENCOUNTER — Telehealth: Payer: Self-pay | Admitting: Pharmacist

## 2020-08-20 ENCOUNTER — Ambulatory Visit: Payer: HMO

## 2020-08-20 NOTE — Progress Notes (Unsigned)
Chronic Care Management Pharmacy Assistant   Name: BLISS TSANG  MRN: [id number] DOB: May 17, 1951  Reason for Encounter: Disease State-Diabetes    Medications: Outpatient Encounter Medications as of 08/20/2020  Medication Sig  . ALPRAZolam (XANAX) 1 MG tablet TAKE 1/2 TABLET BY MOUTH 4 TIMES A DAY  . aspirin EC 81 MG tablet Take 81 mg by mouth daily with lunch.  [name] azelastine (ASTELIN) 0.1 % nasal spray Place 2 sprays into both nostrils 2 (two) times daily. Office visit is due  . bisacodyl (DULCOLAX) 10 MG suppository Place 1 suppository (10 mg total) rectally daily as needed for moderate constipation.  . calcium carbonate (TUMS EX) 750 MG chewable tablet Chew 1 tablet by mouth daily as needed for heartburn.   . cholecalciferol (VITAMIN D) 1000 UNITS tablet Take 2,000 Units by mouth daily.  . ciclopirox (PENLAC) 8 % solution Apply topically at bedtime. Apply over nail and surrounding skin. Apply daily over previous coat. After seven (7) days, may remove with alcohol and continue cycle.  [name] EPINEPHRINE 0.3 mg/0.3 mL IJ SOAJ injection INJECT 0.3 MLS INTO THE MUSCLE ONCE AS DIRECTED  . fexofenadine (ALLEGRA) 180 MG tablet Take 90 mg by mouth 2 (two) times daily.  . fluticasone (FLONASE) 50 MCG/ACT nasal spray PLACE 2 SPRAYS INTO BOTH NOSTRILS DAILY.  . hydrochlorothiazide (HYDRODIURIL) 25 MG tablet TAKE 1 TABLET (25 MG TOTAL) BY MOUTH DAILY. ANNUAL APPT DUE IN OCT MUST SEE PROVIDER FOR FUTURE REFILLS  . metFORMIN (GLUCOPHAGE-XR) 500 MG 24 hr tablet TAKE 3 TABLETS (1,500 MG TOTAL) BY MOUTH DAILY WITH BREAKFAST.  . metoprolol tartrate (LOPRESSOR) 25 MG tablet TAKE 1 TABLET (25 MG TOTAL) BY MOUTH 3 (THREE) TIMES DAILY.  . montelukast (SINGULAIR) 10 MG tablet TAKE 1/2 TABLET BY MOUTH 2 TIMES DAILY.  . Multiple Vitamin (MULTIVITAMIN WITH MINERALS) TABS Take 1 tablet by mouth daily.  [name] omega-3 acid ethyl esters (LOVAZA) 1 g capsule TAKE 1 CAPSULE (1 G TOTAL) BY MOUTH 2 (TWO) TIMES DAILY. ANNUAL  APPT DUE IN OCT MUST SEE PROVIDER FOR FUTURE REFILLS  . OVER THE COUNTER MEDICATION Apply 1 application topically daily as needed (for acne). Oxy 10 vanishing cream  . polyethylene glycol powder (GLYCOLAX/MIRALAX) powder MIX 17G WITH LIQUID AND DRINK BY MOUTH DAILY.  [name] PREVIDENT 5000 DRY MOUTH 1.1 % GEL dental gel APPLY THIN RIBBON AND BRUSH FOR 2 MINUTES DAILY IN PLACE OF CONVENTIONAL TOOTHPASTE  . rosuvastatin (CRESTOR) 10 MG tablet TAKE 1 TABLET BY MOUTH ONCE DAILY  . thiamine (VITAMIN B-1) 100 MG tablet Take 100 mg by mouth 2 (two) times daily at 10 AM and 5 PM.   . TURMERIC CURCUMIN PO Take by mouth.  . vitamin B-12 (CYANOCOBALAMIN) 500 MCG tablet Take 500-1,000 mcg by mouth daily.   . [DISCONTINUED] oxyCODONE (OXY IR/ROXICODONE) 5 MG immediate release tablet Take 1 tablet (5 mg total) by mouth every 4 (four) hours as needed.   No facility-administered encounter medications on file as of 08/20/2020.     Recent Relevant Labs: Lab Results  Component Value Date/Time   HGBA1C 7.5 (H) 03/20/2020 03:24 PM   HGBA1C 7.7 (H) 06/19/2019 01:57 PM   MICROALBUR 0.9 03/20/2020 03:24 PM   MICROALBUR 3.2 (H) 03/19/2019 03:58 PM    Kidney Function Lab Results  Component Value Date/Time   CREATININE 1.04 07/22/2020 04:22 PM   CREATININE 0.88 03/20/2020 03:24 PM   CREATININE 0.98 04/03/2015 09:57 AM   CREATININE 0.94 03/29/2014 01:53 PM   GFR  74.46 07/22/2020 04:22 PM   GFRNONAA >60 06/21/2018 11:43 AM   GFRNONAA >89 06/09/2012 03:15 PM   GFRAA >60 06/21/2018 11:43 AM   GFRAA >89 06/09/2012 03:15 PM    . Current antihyperglycemic regimen:  Metformin ER 500 mg - 3 tabs AM  . What recent interventions/DTPs have been made to improve glycemic control:   None noted . Have there been any recent hospitalizations or ED visits since last visit with CPP? No . Patient {reports/denies:24182} hypoglycemic symptoms, including {Hypoglycemic Symptoms:3049003}   . Patient {reports/denies:24182}  hyperglycemic symptoms, including {symptoms; hyperglycemia:17903}    . How often are you checking your blood sugar? {BG Testing frequency:23922}    . What are your blood sugars ranging?  o Fasting: *** o Before meals: *** o After meals: *** o Bedtime: *** . During the week, how often does your blood glucose drop below 70? {LowBGfrequency:24142} . Are you checking your feet daily/regularly?   Adherence Review: Is the patient currently on a STATIN medication? Yes Is the patient currently on ACE/ARB medication? Yes Does the patient have >5 day gap between last estimated fill dates? {yes/no:20286}   [employer]

## 2020-08-21 ENCOUNTER — Ambulatory Visit (INDEPENDENT_AMBULATORY_CARE_PROVIDER_SITE_OTHER): Payer: HMO | Admitting: *Deleted

## 2020-08-21 DIAGNOSIS — E785 Hyperlipidemia, unspecified: Secondary | ICD-10-CM | POA: Diagnosis not present

## 2020-08-21 DIAGNOSIS — F332 Major depressive disorder, recurrent severe without psychotic features: Secondary | ICD-10-CM

## 2020-08-21 DIAGNOSIS — E1169 Type 2 diabetes mellitus with other specified complication: Secondary | ICD-10-CM | POA: Diagnosis not present

## 2020-08-21 DIAGNOSIS — E1140 Type 2 diabetes mellitus with diabetic neuropathy, unspecified: Secondary | ICD-10-CM

## 2020-08-21 DIAGNOSIS — F419 Anxiety disorder, unspecified: Secondary | ICD-10-CM

## 2020-08-21 NOTE — Patient Instructions (Addendum)
Visit Information  PATIENT GOALS: Goals Addressed            This Visit's Progress   . Begin and Stick with Counseling-Depression       Timeframe:  Long-Range Goal Priority:  High Start Date:     07/23/2020                        Expected End Date:     11/11/2020                  Follow Up Date 09/09/2020   - keep 90 percent of counseling appointments - schedule counseling appointment    Why is this important?    Beating depression may take some time.   If you don't feel better right away, don't give up on your treatment plan.    Notes:        Patient verbalizes understanding of instructions provided today and agrees to view in [place].   Telephone follow up appointment with care management team member scheduled for:09/09/2020  [name] MSW, [place] Licensed Clinical Social Worker [place] [phone]

## 2020-08-21 NOTE — Chronic Care Management (AMB) (Signed)
Chronic Care Management    Clinical Social Work Note  08/22/2020 Name: [name] MRN: [id number] DOB: 31-Oct-1950 NICCOLO BURGGRAF is a 70 y.o. year old male who is a primary care patient of Sharlet Salina Real Cons, MD. The CCM team was consulted to assist the patient with chronic disease management and/or care coordination needs related to: Community Resources  and [place] with patient by telephone for follow up visit in response to provider referral for social work chronic care management and care coordination services.  Consent to Services:  The patient was given information about Chronic Care Management services, agreed to services, and gave verbal consent prior to initiation of services.  Please see initial visit note for detailed documentation.  Patient agreed to services and consent obtained.  Assessment: Review of patient past medical history, allergies, medications, and health status, including review of relevant consultants reports was performed today as part of a comprehensive evaluation and provision of chronic care management and care coordination services.     SDOH (Social Determinants of Health) assessments and interventions performed:    Advanced Directives Status: Pt is willing to review and consider completion. Wants an "online fillable" document and may retrieve one online or from [employer].   CCM Care Plan  Allergies  Allergen Reactions  . Testosterone Shortness Of Breath    Medication:Androgel Pt states trouble breathing at night  . Codeine Nausea And Vomiting  . Gabapentin     "drunk" feeling  . Ultram [Tramadol] Nausea And Vomiting  . Albuterol Other (See Comments)    Panic attack    Outpatient Encounter Medications as of 08/21/2020  Medication Sig  . ALPRAZolam (XANAX) 1 MG tablet TAKE 1/2 TABLET BY MOUTH 4 TIMES A DAY  . aspirin EC 81 MG tablet Take 81 mg by mouth daily with lunch.  [name] azelastine (ASTELIN) 0.1 % nasal spray Place 2  sprays into both nostrils 2 (two) times daily. Office visit is due  . bisacodyl (DULCOLAX) 10 MG suppository Place 1 suppository (10 mg total) rectally daily as needed for moderate constipation.  . calcium carbonate (TUMS EX) 750 MG chewable tablet Chew 1 tablet by mouth daily as needed for heartburn.   . cholecalciferol (VITAMIN D) 1000 UNITS tablet Take 2,000 Units by mouth daily.  . ciclopirox (PENLAC) 8 % solution Apply topically at bedtime. Apply over nail and surrounding skin. Apply daily over previous coat. After seven (7) days, may remove with alcohol and continue cycle.  [name] EPINEPHRINE 0.3 mg/0.3 mL IJ SOAJ injection INJECT 0.3 MLS INTO THE MUSCLE ONCE AS DIRECTED  . fexofenadine (ALLEGRA) 180 MG tablet Take 90 mg by mouth 2 (two) times daily.  . fluticasone (FLONASE) 50 MCG/ACT nasal spray PLACE 2 SPRAYS INTO BOTH NOSTRILS DAILY.  . hydrochlorothiazide (HYDRODIURIL) 25 MG tablet TAKE 1 TABLET (25 MG TOTAL) BY MOUTH DAILY. ANNUAL APPT DUE IN OCT MUST SEE PROVIDER FOR FUTURE REFILLS  . metFORMIN (GLUCOPHAGE-XR) 500 MG 24 hr tablet TAKE 3 TABLETS (1,500 MG TOTAL) BY MOUTH DAILY WITH BREAKFAST.  . metoprolol tartrate (LOPRESSOR) 25 MG tablet TAKE 1 TABLET (25 MG TOTAL) BY MOUTH 3 (THREE) TIMES DAILY.  . montelukast (SINGULAIR) 10 MG tablet TAKE 1/2 TABLET BY MOUTH 2 TIMES DAILY.  . Multiple Vitamin (MULTIVITAMIN WITH MINERALS) TABS Take 1 tablet by mouth daily.  [name] omega-3 acid ethyl esters (LOVAZA) 1 g capsule TAKE 1 CAPSULE (1 G TOTAL) BY MOUTH 2 (TWO) TIMES DAILY. ANNUAL APPT DUE IN OCT MUST  SEE PROVIDER FOR FUTURE REFILLS  . OVER THE COUNTER MEDICATION Apply 1 application topically daily as needed (for acne). Oxy 10 vanishing cream  . polyethylene glycol powder (GLYCOLAX/MIRALAX) powder MIX 17G WITH LIQUID AND DRINK BY MOUTH DAILY.  [name] PREVIDENT 5000 DRY MOUTH 1.1 % GEL dental gel APPLY THIN RIBBON AND BRUSH FOR 2 MINUTES DAILY IN PLACE OF CONVENTIONAL TOOTHPASTE  . rosuvastatin (CRESTOR) 10  MG tablet TAKE 1 TABLET BY MOUTH ONCE DAILY  . thiamine (VITAMIN B-1) 100 MG tablet Take 100 mg by mouth 2 (two) times daily at 10 AM and 5 PM.   . TURMERIC CURCUMIN PO Take by mouth.  . vitamin B-12 (CYANOCOBALAMIN) 500 MCG tablet Take 500-1,000 mcg by mouth daily.   . [DISCONTINUED] oxyCODONE (OXY IR/ROXICODONE) 5 MG immediate release tablet Take 1 tablet (5 mg total) by mouth every 4 (four) hours as needed.   No facility-administered encounter medications on file as of 08/21/2020.    Patient Active Problem List   Diagnosis Date Noted  . History of aortic dissection 05/28/2020  . Genitourinary complaints 04/04/2020  . Elevated LFTs 03/20/2020  . Benign prostatic hyperplasia without lower urinary tract symptoms 03/20/2020  . Skin lesion of scalp 03/20/2020  . Pseudodementia 03/05/2020  . Cerebral microvascular disease 03/05/2020  . Idiopathic small fiber sensory neuropathy 03/05/2020  . Severe episode of recurrent major depressive disorder, without psychotic features ([place]) 03/05/2020  . Colon cancer screening 10/12/2019  . Coronary artery calcification seen on CAT scan 03/17/2018  . Ischemic brain damage 08/17/2017  . Routine general medical examination at a health care facility 07/18/2015  . Thyroid nodule 04/21/2015  . AAA (abdominal aortic aneurysm) without rupture ([place]) 04/15/2015  . MCI (mild cognitive impairment) 08/26/2014  . Benign paroxysmal positional vertigo 08/26/2014  . Peripheral neuropathy 03/21/2013  . DM2 (diabetes mellitus, type 2) ([place])   . Brachial plexopathy   . Anxiety   . Occlusion and stenosis of carotid artery without mention of cerebral infarction 04/11/2012  . AAA (abdominal aortic aneurysm) ([place]) 03/17/2012  . Vitamin D deficiency 12/17/2008  . Hyperlipidemia associated with type 2 diabetes mellitus ([place]) 12/17/2008  . OBSTRUCTIVE SLEEP APNEA 12/17/2008  . Migraine 12/17/2008  . ALLERGIC RHINITIS 12/17/2008  . Asthma 12/17/2008  . ERECTILE  DYSFUNCTION, ORGANIC 12/17/2008    Conditions to be addressed/monitored: DMII and Depression; Mental Health Concerns  and Social Isolation  Care Plan : LCSW Plan of Care  Updates made by Deirdre Peer, LCSW since 08/22/2020 12:00 AM    Problem: Need for patient centered plan of care to promote improved quality of life   Priority: High    Long-Range Goal: Develop self management plan to improve quality of life   Start Date: 07/23/2020  Expected End Date: 11/11/2020  This Visit's Progress: On track  Recent Progress: On track  Priority: High  Note:    Current barriers:   . Continued feelings of being alone/isolated "wanting to have meaningful intellectual conversations" . Concerns about taking SSRI's (side effects) . Limited self health management plan in place  to address perceived decline in quality of life related to retirement, COVID isolation, etc. . Negative thoughts and feelings around self, relationships and isolation. Clinical Goal(s): patient will work with SW to reduce or manage symptoms related to feelings of decrease in quality of life.  Clinical Interventions:  . Telephone follow up call to pt today who reports he has  attended his first in -person appointment with a Psychologist who will be  doing his on-going counseling support- next visit is 08/26/2020 . Assessed patient's previous treatment, needs, coping skills, current treatment, support system and barriers to care . Patient interviewed and appropriate assessments performed or reviewed: PHQ 9;Suicidal Ideation/Homicidal Ideation: No . Reviewed medications with patient prescribed by PCP and discussed adherence- remains resistant/hesitant to SSRI's due to negative side effects experienced in past  . Other interventions include: Motivational Interviewing, Solution-Focused Strategies, Emotional/Supportive Counseling, Problem Solving, and Referred to psychologist  . Collaboration with PCP regarding development and update of  comprehensive plan of care as evidenced by provider attestation and co-signature . Inter-disciplinary care team collaboration (see longitudinal plan of care) Patient Goals/Self-Care Activities:  - attend follow up counseling visit with Psychologist as scheduled (next visit 08/26/2020) - call and visit an old co-worker - check out volunteer opportunities (emailed and previously provided) - perform a random act of kindness - practice positive self thoughts - continue social outings to [employer] and senior center engaging with others  -review and consider completion of Advance Directive paperwork  Follow Up Plan: 09/09/2020 11:30 am      Follow Up Plan: Appointment scheduled for SW follow up with client by phone on: 09/09/2020 11:30      [name] MSW, [place] Licensed Clinical Social Worker [place] [phone]

## 2020-08-25 ENCOUNTER — Other Ambulatory Visit: Payer: Self-pay | Admitting: Cardiovascular Disease

## 2020-08-25 ENCOUNTER — Ambulatory Visit (INDEPENDENT_AMBULATORY_CARE_PROVIDER_SITE_OTHER): Payer: HMO | Admitting: Podiatry

## 2020-08-25 ENCOUNTER — Other Ambulatory Visit: Payer: Self-pay | Admitting: Internal Medicine

## 2020-08-25 ENCOUNTER — Other Ambulatory Visit: Payer: Self-pay

## 2020-08-25 DIAGNOSIS — B351 Tinea unguium: Secondary | ICD-10-CM | POA: Diagnosis not present

## 2020-08-25 DIAGNOSIS — L989 Disorder of the skin and subcutaneous tissue, unspecified: Secondary | ICD-10-CM

## 2020-08-26 ENCOUNTER — Other Ambulatory Visit: Payer: Self-pay | Admitting: Cardiovascular Disease

## 2020-08-26 DIAGNOSIS — F411 Generalized anxiety disorder: Secondary | ICD-10-CM | POA: Diagnosis not present

## 2020-09-01 NOTE — Progress Notes (Signed)
Subjective: 70 year old male presents the office today for follow-up evaluation of fungus on his left foot as well as a callus on his right foot.  He is currently doing over-the-counter antifungal medication for the fifth digit.  We previously was doing Penlac but he discontinued this after he spoke to the pharmacist.  Denies any open lesions.  Denies any systemic complaints such as fevers, chills, nausea, vomiting. No acute changes since last appointment, and no other complaints at this time.   Objective: AAO x3, NAD DP/PT pulses palpable bilaterally, CRT less than 3 seconds Nails in general hypertrophic, dystrophic and yellow discoloration but most notably present on the left fifth toe.  No significant pain in the toe today.  There is no edema, erythema, drainage or pus or signs of infection.  Hyperkeratotic lesion submetatarsal right foot without any underlying ulceration drainage or any signs of infection.  No pain with calf compression, swelling, warmth, erythema  Assessment: Onychomycosis, hyperkeratotic lesion  Plan: -All treatment options discussed with the patient including all alternatives, risks, complications.  -Continue topical antifungal.  Recurrence of debrided the nails with any complications or bleeding. -Debrided hyperkeratotic lesion without any complications or bleeding.  Continue moisturizer and offloading. -Patient encouraged to call the office with any questions, concerns, change in symptoms.

## 2020-09-05 NOTE — Progress Notes (Signed)
CARDIOLOGY CONSULT NOTE       Patient ID: [name] MRN: [id number] DOB/AGE: November 29, 1950 70 y.o.  Primary Physician: Hoyt Koch, MD Primary Cardiologist: Johnsie Cancel    HPI:  70 y.o.  status post repair of type A ascending dissection 02/19/12 PVT  with Hemi shield graft used to replace ascending aorta and hemi-arch reconstruction, resuspension of aortic valve Likely brachial plexus injury with axillary artery cannulation on right side. Had staged  AAA repair by Dr Kellie Simmering 10/2012 CT done 05/28/20 and followed by VVS/CVTS reviewed with residual false lumen 4.2 cm  Echo 02/15/20 reviewed:  EF 60-65% mild AR mild AS mean gradient 11 mmHg Cr has been normal at 1.04 07/22/20   Seen by primary 07/22/20 with ? TIA or complex migraine Had confusion and inability To use computer Accompanied by headache MRI head normal 08/17/20 Carotid 02/2019 Only had plaque no stenosis  BP readings from home reviewed and normal Misses socialization of work  Is [facility] and exercising 2x/week with no chest pain / angina   He inquired about calcium score. I told him I don't think it would be useful with his Known vascular disease He is asymptomatic with no angina and had normal myovue  In November 1517 Certainly we can consider f/u perfusion imaging in near future    ROS All other systems reviewed and negative except as noted above  Past Medical History:  Diagnosis Date  . AAA (abdominal aortic aneurysm) ([place])   . ALLERGIC RHINITIS   . Anemia    post op, treated /w Fe  . Anxiety   . Arthritis    OA, hands, - thumbs- specifically  . Brachial plexopathy    double crush injury with median nerve damage on the right dominant hand.   [name] COPD (chronic obstructive pulmonary disease) ([place])    also states he has been told that he has slight emphysema   . Dissecting aortic aneurysm, thoracic ([place]) 02/20/2012   s/p emergent surgical repair  . DM2 (diabetes mellitus, type 2) ([place])   . DVT  (deep venous thrombosis) ([facility])    coumadin- x3 months , post op  . ERECTILE DYSFUNCTION, ORGANIC   . GERD (gastroesophageal reflux disease)    related to diet & anxiety   . HYPERLIPIDEMIA   . Hypertension    pt. followwed by Dr. Johnsie Cancel  . MIGRAINE HEADACHE    induced from physical , emotional stress , but sometimes occur randomly, also experiences BPPV  . OBSTRUCTIVE SLEEP APNEA 02/2008 sleep study   moderate & central- not able to tolerate CPAP   . Thyroid nodule 04/21/2015   2.3 cm on right, incidental finding on 03/2015 CT chest - f/u US ordered  . Unspecified vitamin D deficiency     Family History  Problem Relation Age of Onset  . Diabetes Mother   . Stroke Mother   . Dementia Mother   . Deep vein thrombosis Father        PE  . Varicose Veins Father   . Heart disease Maternal Grandmother   . Heart disease Maternal Uncle   . Heart disease Paternal Uncle   . Heart disease Cousin   . Lung cancer Cousin        2 cousins    Social History   Socioeconomic History  . Marital status: Divorced    Spouse name: Not on file  . Number of children: 2  . Years of education: acad. deg.  . Highest education  level: Not on file  Occupational History  . Occupation: internal med resident    Employer: [employer]  Tobacco Use  . Smoking status: Former Smoker    Packs/day: 0.10    Types: Cigarettes    Quit date: 02/18/2012    Years since quitting: 8.5  . Smokeless tobacco: Never Used  . Tobacco comment: 10 cigs/day, quit 02/2012 after TAA emergent repair  Vaping Use  . Vaping Use: Never used  Substance and Sexual Activity  . Alcohol use: Yes    Alcohol/week: 0.0 standard drinks    Comment: shot of Tequila every day   . Drug use: No  . Sexual activity: Not on file  Other Topics Concern  . Not on file  Social History Narrative   Patient is single and lives at home alone.    Arboriculturist.   Patient works at [employer] full time.   Right handed.   Caffeine one cup of coffee  daily.    Social Determinants of Health   Financial Resource Strain: Low Risk   . Difficulty of Paying Living Expenses: Not hard at all  Food Insecurity: No Food Insecurity  . Worried About Charity fundraiser in the Last Year: Never true  . Ran Out of Food in the Last Year: Never true  Transportation Needs: No Transportation Needs  . Lack of Transportation (Medical): No  . Lack of Transportation (Non-Medical): No  Physical Activity: Not on file  Stress: Not on file  Social Connections: Not on file  Intimate Partner Violence: Not on file    Past Surgical History:  Procedure Laterality Date  . ABDOMINAL AORTIC ANEURYSM REPAIR N/A 11/01/2012   Procedure: ANEURYSM ABDOMINAL AORTIC REPAIR;  Surgeon: Mal Misty, MD;  Location: [facility] OR;  Service: Vascular;  Laterality: N/A;  Resection and Grafting of Abdominal Aortic Aneurysm,(AORTA BI ILIAC)  . Nasal fx without repair    . THORACIC AORTIC ANEURYSM REPAIR  02/20/2012   Procedure: THORACIC ASCENDING ANEURYSM REPAIR (AAA);  Surgeon: Ivin Poot, MD;  Location: [facility];  Service: Open Heart Surgery;  Laterality: N/A;      Current Outpatient Medications:  .  ALPRAZolam (XANAX) 1 MG tablet, TAKE 1/2 TABLET BY MOUTH 4 TIMES A DAY, Disp: 60 tablet, Rfl: 5 .  aspirin EC 81 MG tablet, Take 81 mg by mouth daily with lunch., Disp: , Rfl:  .  azelastine (ASTELIN) 0.1 % nasal spray, Place 2 sprays into both nostrils 2 (two) times daily. Office visit is due, Disp: 30 mL, Rfl: 0 .  bisacodyl (DULCOLAX) 10 MG suppository, Place 1 suppository (10 mg total) rectally daily as needed for moderate constipation., Disp: 12 suppository, Rfl: 0 .  calcium carbonate (TUMS EX) 750 MG chewable tablet, Chew 1 tablet by mouth daily as needed for heartburn. , Disp: , Rfl:  .  Cetirizine HCl (ZYRTEC PO), Take by mouth., Disp: , Rfl:  .  cholecalciferol (VITAMIN D) 1000 UNITS tablet, Take 2,000 Units by mouth daily., Disp: , Rfl:  .  EPINEPHRINE 0.3 mg/0.3 mL IJ SOAJ  injection, INJECT 0.3 MLS INTO THE MUSCLE ONCE AS DIRECTED, Disp: 2 each, Rfl: 3 .  fexofenadine (ALLEGRA) 180 MG tablet, Take 90 mg by mouth 2 (two) times daily., Disp: , Rfl:  .  fluticasone (FLONASE) 50 MCG/ACT nasal spray, PLACE 2 SPRAYS INTO BOTH NOSTRILS DAILY., Disp: 16 g, Rfl: 2 .  hydrochlorothiazide (HYDRODIURIL) 25 MG tablet, TAKE 1 TABLET (25 MG TOTAL) BY MOUTH DAILY. ANNUAL  APPT DUE IN OCT MUST SEE PROVIDER FOR FUTURE REFILLS, Disp: 90 tablet, Rfl: 1 .  metFORMIN (GLUCOPHAGE-XR) 500 MG 24 hr tablet, TAKE 3 TABLETS (1,500 MG TOTAL) BY MOUTH DAILY WITH BREAKFAST., Disp: 270 tablet, Rfl: 3 .  metoprolol tartrate (LOPRESSOR) 25 MG tablet, TAKE 1 TABLET (25 MG TOTAL) BY MOUTH 3 (THREE) TIMES DAILY., Disp: 270 tablet, Rfl: 1 .  montelukast (SINGULAIR) 10 MG tablet, TAKE 1/2 TABLET BY MOUTH 2 TIMES DAILY., Disp: 90 tablet, Rfl: 1 .  Multiple Vitamin (MULTIVITAMIN WITH MINERALS) TABS, Take 1 tablet by mouth daily., Disp: , Rfl:  .  omega-3 acid ethyl esters (LOVAZA) 1 g capsule, TAKE 1 CAPSULE (1 G TOTAL) BY MOUTH 2 (TWO) TIMES DAILY. ANNUAL APPT DUE IN OCT MUST SEE PROVIDER FOR FUTURE REFILLS, Disp: 180 capsule, Rfl: 1 .  OVER THE COUNTER MEDICATION, Apply 1 application topically daily as needed (for acne). Oxy 10 vanishing cream, Disp: , Rfl:  .  polyethylene glycol powder (GLYCOLAX/MIRALAX) powder, MIX 17G WITH LIQUID AND DRINK BY MOUTH DAILY., Disp: 527 g, Rfl: 5 .  PREVIDENT 5000 DRY MOUTH 1.1 % GEL dental gel, APPLY THIN RIBBON AND BRUSH FOR 2 MINUTES DAILY IN PLACE OF CONVENTIONAL TOOTHPASTE, Disp: , Rfl: 99 .  rosuvastatin (CRESTOR) 10 MG tablet, TAKE 1 TABLET BY MOUTH ONCE DAILY, Disp: 90 tablet, Rfl: 1 .  thiamine (VITAMIN B-1) 100 MG tablet, Take 100 mg by mouth 2 (two) times daily at 10 AM and 5 PM. , Disp: , Rfl:  .  TURMERIC CURCUMIN PO, Take by mouth., Disp: , Rfl:  .  vitamin B-12 (CYANOCOBALAMIN) 500 MCG tablet, Take 500-1,000 mcg by mouth daily. , Disp: , Rfl:    Physical  Exam:  Affect appropriate Healthy:  appears stated age 70: normal Neck supple with no adenopathy JVP normal left bruits no thyromegaly Lungs clear with no wheezing and good diaphragmatic motion Heart:  S1/S2 SEM and AR murmur, no rub, gallop or click PMI normal post sternotomy  Abdomen: benighn, BS positve, no tenderness, no AAA no bruit.  No HSM or HJR post AAA repair  Distal pulses intact with no bruits No edema Neuro non-focal Skin warm and dry No muscular weakness   Labs:   Lab Results  Component Value Date   WBC 7.1 07/22/2020   HGB 15.7 07/22/2020   HCT 45.8 07/22/2020   MCV 93.1 07/22/2020   PLT 159.0 07/22/2020   No results for input(s): NA, K, CL, CO2, BUN, CREATININE, CALCIUM, PROT, BILITOT, ALKPHOS, ALT, AST, GLUCOSE in the last 168 hours.  Invalid input(s): LABALBU Lab Results  Component Value Date   TROPONINI <0.30 04/02/2012    Lab Results  Component Value Date   CHOL 135 03/20/2020   CHOL 131 03/19/2019   CHOL 120 03/14/2018   Lab Results  Component Value Date   HDL 42.60 03/20/2020   HDL 36.30 (L) 03/19/2019   HDL 33.40 (L) 03/14/2018   Lab Results  Component Value Date   LDLCALC 68 03/20/2020   LDLCALC 61 03/19/2019   LDLCALC 68 03/14/2018   Lab Results  Component Value Date   TRIG 124.0 03/20/2020   TRIG 165.0 (H) 03/19/2019   TRIG 94.0 03/14/2018   Lab Results  Component Value Date   CHOLHDL 3 03/20/2020   CHOLHDL 4 03/19/2019   CHOLHDL 4 03/14/2018   Lab Results  Component Value Date   LDLDIRECT 114 (H) 03/21/2010      Radiology: MR Brain Wo Contrast  Result  Date: 08/17/2020 CLINICAL DATA:  TIA, ophthalmoplegic migraine EXAM: MRI HEAD WITHOUT CONTRAST TECHNIQUE: Multiplanar, multiecho pulse sequences of the brain and surrounding structures were obtained without intravenous contrast. COMPARISON:  October 2017 FINDINGS: Brain: There is no acute infarction or intracranial hemorrhage. There is no intracranial mass, mass  effect, or edema. There is no hydrocephalus or extra-axial fluid collection. Ventricles and sulci are within normal limits in size and configuration. Chronic small vessel infarcts of the centrum semiovale bilaterally, new on the right. Minimal additional foci of T2 hyperintensity in the cerebral white matter may reflect minor chronic microvascular ischemic changes. Few scattered foci of susceptibility in the subcortical white matter, thalamus, and cerebellum reflecting chronic microhemorrhages. Vascular: Major vessel flow voids at the skull base are preserved. Skull and upper cervical spine: Normal marrow signal is preserved. Sinuses/Orbits: Trace mucosal thickening.  Orbits are unremarkable. Other: Sella is unremarkable.  Minor mastoid fluid opacification. IMPRESSION: No evidence of recent infarction, hemorrhage, or mass. Minimal chronic microvascular ischemic changes, chronic small vessel infarcts, and chronic microhemorrhages without substantial progression. Electronically Signed   By: Macy Mis M.D.   On: 08/17/2020 18:12    EKG: SR rate 56 normal 06/24/18 08/14/19 SR rate 73 nonspecific ST changes    ASSESSMENT AND PLAN:    1. Type A Dissection: grafted root and resuspended valve stable by CT mild AS/AR f/u echo September 2022   2. AAA:  F/U VVS false lumen stable by CT 05/28/20 4.2 cm  post endovascular repair   3. DM:  Discussed low carb diet.  Target hemoglobin A1c is 6.5 or less.  Continue current medications.  4. HLD:  LDL at goal continue statin   5. Bruit:  Plaque on stenosis on duplex 03/13/19  Given recent ? TIA will repeat   6. Cognitive:  Some memory issues ? Migrainous aura f/u neuro  MRI head normal 08/17/20   7. AV:  Mild AS/AR by echo 02/15/20 f/u echo in September    F/U in 6 months  Echo September 2022  Carotid duplex now ? TIA   Signed: Jenkins Rouge 09/12/2020, 3:40 PM

## 2020-09-09 ENCOUNTER — Telehealth: Payer: Self-pay | Admitting: *Deleted

## 2020-09-09 ENCOUNTER — Ambulatory Visit: Payer: HMO | Admitting: *Deleted

## 2020-09-09 DIAGNOSIS — R5382 Chronic fatigue, unspecified: Secondary | ICD-10-CM

## 2020-09-09 DIAGNOSIS — F419 Anxiety disorder, unspecified: Secondary | ICD-10-CM

## 2020-09-09 DIAGNOSIS — E1169 Type 2 diabetes mellitus with other specified complication: Secondary | ICD-10-CM

## 2020-09-09 DIAGNOSIS — F332 Major depressive disorder, recurrent severe without psychotic features: Secondary | ICD-10-CM

## 2020-09-09 NOTE — Chronic Care Management (AMB) (Signed)
Chronic Care Management    Clinical Social Work Note  09/09/2020 Name: [name] MRN: [id number] DOB: 03-May-1951  HURLEY SOBEL is a 70 y.o. year old male who is a primary care patient of Sharlet Salina Real Cons, MD. The CCM team was consulted to assist the patient with chronic disease management and/or care coordination needs related to: [employer]  and [place] and Resources.  Engaged with patient by telephone for follow up visit in response to provider referral for social work chronic care management and care coordination services.  Consent to Services:  The patient was given information about Chronic Care Management services, agreed to services, and gave verbal consent prior to initiation of services.  Please see initial visit note for detailed documentation.  Patient agreed to services and consent obtained.  Assessment: Review of patient past medical history, allergies, medications, and health status, including review of relevant consultants reports was performed today as part of a comprehensive evaluation and provision of chronic care management and care coordination services.    SDOH (Social Determinants of Health) assessments and interventions performed:    Advanced Directives Status: Not addressed in this encounter. and Considering but wants an online document CCM Care Plan  Allergies  Allergen Reactions  . Testosterone Shortness Of Breath    Medication:Androgel Pt states trouble breathing at night  . Codeine Nausea And Vomiting  . Gabapentin     "drunk" feeling  . Ultram [Tramadol] Nausea And Vomiting  . Albuterol Other (See Comments)    Panic attack    Outpatient Encounter Medications as of 09/09/2020  Medication Sig  . ALPRAZolam (XANAX) 1 MG tablet TAKE 1/2 TABLET BY MOUTH 4 TIMES A DAY  . aspirin EC 81 MG tablet Take 81 mg by mouth daily with lunch.  [name] azelastine (ASTELIN) 0.1 % nasal spray Place 2 sprays into both nostrils 2 (two)  times daily. Office visit is due  . bisacodyl (DULCOLAX) 10 MG suppository Place 1 suppository (10 mg total) rectally daily as needed for moderate constipation.  . calcium carbonate (TUMS EX) 750 MG chewable tablet Chew 1 tablet by mouth daily as needed for heartburn.   . cholecalciferol (VITAMIN D) 1000 UNITS tablet Take 2,000 Units by mouth daily.  . ciclopirox (PENLAC) 8 % solution Apply topically at bedtime. Apply over nail and surrounding skin. Apply daily over previous coat. After seven (7) days, may remove with alcohol and continue cycle.  [name] EPINEPHRINE 0.3 mg/0.3 mL IJ SOAJ injection INJECT 0.3 MLS INTO THE MUSCLE ONCE AS DIRECTED  . fexofenadine (ALLEGRA) 180 MG tablet Take 90 mg by mouth 2 (two) times daily.  . fluticasone (FLONASE) 50 MCG/ACT nasal spray PLACE 2 SPRAYS INTO BOTH NOSTRILS DAILY.  . hydrochlorothiazide (HYDRODIURIL) 25 MG tablet TAKE 1 TABLET (25 MG TOTAL) BY MOUTH DAILY. ANNUAL APPT DUE IN OCT MUST SEE PROVIDER FOR FUTURE REFILLS  . metFORMIN (GLUCOPHAGE-XR) 500 MG 24 hr tablet TAKE 3 TABLETS (1,500 MG TOTAL) BY MOUTH DAILY WITH BREAKFAST.  . metoprolol tartrate (LOPRESSOR) 25 MG tablet TAKE 1 TABLET (25 MG TOTAL) BY MOUTH 3 (THREE) TIMES DAILY.  . montelukast (SINGULAIR) 10 MG tablet TAKE 1/2 TABLET BY MOUTH 2 TIMES DAILY.  . Multiple Vitamin (MULTIVITAMIN WITH MINERALS) TABS Take 1 tablet by mouth daily.  [name] omega-3 acid ethyl esters (LOVAZA) 1 g capsule TAKE 1 CAPSULE (1 G TOTAL) BY MOUTH 2 (TWO) TIMES DAILY. ANNUAL APPT DUE IN OCT MUST SEE PROVIDER FOR FUTURE REFILLS  . OVER THE  COUNTER MEDICATION Apply 1 application topically daily as needed (for acne). Oxy 10 vanishing cream  . polyethylene glycol powder (GLYCOLAX/MIRALAX) powder MIX 17G WITH LIQUID AND DRINK BY MOUTH DAILY.  [name] PREVIDENT 5000 DRY MOUTH 1.1 % GEL dental gel APPLY THIN RIBBON AND BRUSH FOR 2 MINUTES DAILY IN PLACE OF CONVENTIONAL TOOTHPASTE  . rosuvastatin (CRESTOR) 10 MG tablet TAKE 1 TABLET BY MOUTH  ONCE DAILY  . thiamine (VITAMIN B-1) 100 MG tablet Take 100 mg by mouth 2 (two) times daily at 10 AM and 5 PM.   . TURMERIC CURCUMIN PO Take by mouth.  . vitamin B-12 (CYANOCOBALAMIN) 500 MCG tablet Take 500-1,000 mcg by mouth daily.   . [DISCONTINUED] oxyCODONE (OXY IR/ROXICODONE) 5 MG immediate release tablet Take 1 tablet (5 mg total) by mouth every 4 (four) hours as needed.   No facility-administered encounter medications on file as of 09/09/2020.    Patient Active Problem List   Diagnosis Date Noted  . History of aortic dissection 05/28/2020  . Genitourinary complaints 04/04/2020  . Elevated LFTs 03/20/2020  . Benign prostatic hyperplasia without lower urinary tract symptoms 03/20/2020  . Skin lesion of scalp 03/20/2020  . Pseudodementia 03/05/2020  . Cerebral microvascular disease 03/05/2020  . Idiopathic small fiber sensory neuropathy 03/05/2020  . Severe episode of recurrent major depressive disorder, without psychotic features ([place]) 03/05/2020  . Colon cancer screening 10/12/2019  . Coronary artery calcification seen on CAT scan 03/17/2018  . Ischemic brain damage 08/17/2017  . Routine general medical examination at a health care facility 07/18/2015  . Thyroid nodule 04/21/2015  . AAA (abdominal aortic aneurysm) without rupture ([place]) 04/15/2015  . MCI (mild cognitive impairment) 08/26/2014  . Benign paroxysmal positional vertigo 08/26/2014  . Peripheral neuropathy 03/21/2013  . DM2 (diabetes mellitus, type 2) ([place])   . Brachial plexopathy   . Anxiety   . Occlusion and stenosis of carotid artery without mention of cerebral infarction 04/11/2012  . AAA (abdominal aortic aneurysm) ([place]) 03/17/2012  . Vitamin D deficiency 12/17/2008  . Hyperlipidemia associated with type 2 diabetes mellitus ([place]) 12/17/2008  . OBSTRUCTIVE SLEEP APNEA 12/17/2008  . Migraine 12/17/2008  . ALLERGIC RHINITIS 12/17/2008  . Asthma 12/17/2008  . ERECTILE DYSFUNCTION, ORGANIC 12/17/2008     Conditions to be addressed/monitored: Anxiety, Depression and Pulmonary Disease; Limited social support, Mental Health Concerns , Family and relationship dysfunction and Social Isolation  Care Plan : LCSW Plan of Care  Updates made by Deirdre Peer, LCSW since 09/09/2020 12:00 AM    Problem: Need for patient centered plan of care to promote improved quality of life   Priority: High    Long-Range Goal: Develop self management plan to improve quality of life   Start Date: 07/23/2020  Expected End Date: 11/11/2020  This Visit's Progress: On track  Recent Progress: On track  Priority: High  Note:    Current barriers:   . Continued feelings of being alone/isolated "wanting to have meaningful intellectual conversations" . Concerns about taking SSRI's (side effects) . Limited self health management plan in place  to address perceived decline in quality of life related to retirement, COVID isolation, etc. . Negative thoughts and feelings around self, relationships and isolation. Clinical Goal(s): patient will work with SW to reduce or manage symptoms related to feelings of decrease in quality of life Clinical Interventions:  . Pt has taken some positive steps toward promoting positive change. He continues to seek opportunities to engage with others socially- he has been able to  visit with previous co-workers as well as a neighbor friend a few times. He also visited the [employer] and is seeking other options for socializing and engaging in activities; to include a fraternal lodge for those of [place] ancestors, [employer], etc . Pt reports feeling his depression symptoms have lessened; and acknowledges that "getting out" helps. He does, however face some anxiety within settings/events and is encouraged to discuss this further with his counselor.  . Pt reports next counseling session at [facility] is in April; feels he is comfortable and connecting with her; "I got  lucky" . Assessed patient's previous treatment, needs, coping skills, current treatment, support system and barriers to care . Patient interviewed and appropriate assessments performed or reviewed: PHQ 9;Suicidal Ideation/Homicidal Ideation: No . Reviewed medications with patient prescribed by PCP and discussed adherence- remains resistant/hesitant to SSRI's due to negative side effects experienced in past  . Other interventions include: Motivational Interviewing, Solution-Focused Strategies, Emotional/Supportive Counseling, Problem Solving, and Referred to psychologist  . Collaboration with PCP regarding development and update of comprehensive plan of care as evidenced by provider attestation and co-signature . Inter-disciplinary care team collaboration (see longitudinal plan of care) Patient Goals/Self-Care Activities:   - continue with individual therapy/[facility]ing sessions at [facility]  - continue to seek ways to engage with friends, neighbors, old co-workers, Social research officer, government - check into the  volunteer opportunities in more depth  - perform a random act of kindness - practice positive self thoughts - continue social outings to [employer] and senior center engaging with others  -continue to seek activities that are of interest    -Follow Up Plan: 10/07/2020 11:30 am      Follow Up Plan: Appointment scheduled for SW follow up with client by phone on:  10/07/2020      [name] MSW, [place] Licensed Clinical Social Worker [place] [phone]

## 2020-09-09 NOTE — Patient Instructions (Signed)
Visit Information  PATIENT GOALS: Goals Addressed            This Visit's Progress   . Continue to work with Education officer, museum       Timeframe:  Long-Range Goal Priority:  High Start Date:  07/22/2020                          Expected End Date:     11/11/2020                  Follow Up Date 10/07/2020   - continue with individual therapy/counseling sessions at [facility]  - continue to seek ways to engage with friends, neighbors, old co-workers, Social research officer, government - check into the  volunteer opportunities in more depth  - perform a random act of kindness - practice positive self thoughts - continue social outings to [employer] and senior center engaging with others  -continue to seek activities that are of interest    Why is this important?    When you are stressed, down or upset, your body reacts too.   For example, your blood pressure may get higher; you may have a headache or stomachache.   When your emotions get the best of you, your body's ability to fight off cold and flu gets weak.   These steps will help you manage your emotions.     Notes:        The patient verbalized understanding of instructions, educational materials, and care plan provided today and declined offer to receive copy of patient instructions, educational materials, and care plan.   Telephone follow up appointment with care management team member scheduled for:10/07/2020  [name] MSW, [place] Licensed Clinical Social Worker [place] [phone]

## 2020-09-09 NOTE — Telephone Encounter (Signed)
CSW made contact with pt today. See Care Plan and note in Epic.  [name] MSW, LCSW Licensed Clinical Social Worker [facility] [place] [phone]

## 2020-09-10 ENCOUNTER — Telehealth: Payer: Self-pay | Admitting: Pharmacist

## 2020-09-10 NOTE — Progress Notes (Signed)
    Chronic Care Management Pharmacy Assistant   Name: [name]  MRN: [id number] DOB: [birth year]   Reason for Encounter: Chart Review     Medications: Outpatient Encounter Medications as of 09/10/2020  Medication Sig  . ALPRAZolam (XANAX) 1 MG tablet TAKE 1/2 TABLET BY MOUTH 4 TIMES A DAY  . aspirin EC 81 MG tablet Take 81 mg by mouth daily with lunch.  [name] azelastine (ASTELIN) 0.1 % nasal spray Place 2 sprays into both nostrils 2 (two) times daily. Office visit is due  . bisacodyl (DULCOLAX) 10 MG suppository Place 1 suppository (10 mg total) rectally daily as needed for moderate constipation.  . calcium carbonate (TUMS EX) 750 MG chewable tablet Chew 1 tablet by mouth daily as needed for heartburn.   . cholecalciferol (VITAMIN D) 1000 UNITS tablet Take 2,000 Units by mouth daily.  . ciclopirox (PENLAC) 8 % solution Apply topically at bedtime. Apply over nail and surrounding skin. Apply daily over previous coat. After seven (7) days, may remove with alcohol and continue cycle.  [name] EPINEPHRINE 0.3 mg/0.3 mL IJ SOAJ injection INJECT 0.3 MLS INTO THE MUSCLE ONCE AS DIRECTED  . fexofenadine (ALLEGRA) 180 MG tablet Take 90 mg by mouth 2 (two) times daily.  . fluticasone (FLONASE) 50 MCG/ACT nasal spray PLACE 2 SPRAYS INTO BOTH NOSTRILS DAILY.  . hydrochlorothiazide (HYDRODIURIL) 25 MG tablet TAKE 1 TABLET (25 MG TOTAL) BY MOUTH DAILY. ANNUAL APPT DUE IN OCT MUST SEE PROVIDER FOR FUTURE REFILLS  . metFORMIN (GLUCOPHAGE-XR) 500 MG 24 hr tablet TAKE 3 TABLETS (1,500 MG TOTAL) BY MOUTH DAILY WITH BREAKFAST.  . metoprolol tartrate (LOPRESSOR) 25 MG tablet TAKE 1 TABLET (25 MG TOTAL) BY MOUTH 3 (THREE) TIMES DAILY.  . montelukast (SINGULAIR) 10 MG tablet TAKE 1/2 TABLET BY MOUTH 2 TIMES DAILY.  . Multiple Vitamin (MULTIVITAMIN WITH MINERALS) TABS Take 1 tablet by mouth daily.  [name] omega-3 acid ethyl esters (LOVAZA) 1 g capsule TAKE 1 CAPSULE (1 G TOTAL) BY MOUTH 2 (TWO) TIMES DAILY. ANNUAL APPT  DUE IN OCT MUST SEE PROVIDER FOR FUTURE REFILLS  . OVER THE COUNTER MEDICATION Apply 1 application topically daily as needed (for acne). Oxy 10 vanishing cream  . polyethylene glycol powder (GLYCOLAX/MIRALAX) powder MIX 17G WITH LIQUID AND DRINK BY MOUTH DAILY.  [name] PREVIDENT 5000 DRY MOUTH 1.1 % GEL dental gel APPLY THIN RIBBON AND BRUSH FOR 2 MINUTES DAILY IN PLACE OF CONVENTIONAL TOOTHPASTE  . rosuvastatin (CRESTOR) 10 MG tablet TAKE 1 TABLET BY MOUTH ONCE DAILY  . thiamine (VITAMIN B-1) 100 MG tablet Take 100 mg by mouth 2 (two) times daily at 10 AM and 5 PM.   . TURMERIC CURCUMIN PO Take by mouth.  . vitamin B-12 (CYANOCOBALAMIN) 500 MCG tablet Take 500-1,000 mcg by mouth daily.   . [DISCONTINUED] oxyCODONE (OXY IR/ROXICODONE) 5 MG immediate release tablet Take 1 tablet (5 mg total) by mouth every 4 (four) hours as needed.   No facility-administered encounter medications on file as of 09/10/2020.    Reviewed chart for medication changes and adherence.   No gaps in adherence identified. Patient has follow up scheduled with pharmacy team. No further action required.  [place] Pharmacist Assistant [phone]

## 2020-09-12 ENCOUNTER — Encounter: Payer: Self-pay | Admitting: Cardiovascular Disease

## 2020-09-12 ENCOUNTER — Ambulatory Visit (INDEPENDENT_AMBULATORY_CARE_PROVIDER_SITE_OTHER): Payer: HMO | Admitting: Cardiovascular Disease

## 2020-09-12 ENCOUNTER — Other Ambulatory Visit: Payer: Self-pay

## 2020-09-12 VITALS — BP 110/68 | HR 59 | Ht 76.0 in | Wt 232.0 lb

## 2020-09-12 DIAGNOSIS — I351 Nonrheumatic aortic (valve) insufficiency: Secondary | ICD-10-CM

## 2020-09-12 DIAGNOSIS — R0989 Other specified symptoms and signs involving the circulatory and respiratory systems: Secondary | ICD-10-CM

## 2020-09-12 NOTE — Patient Instructions (Addendum)
Medication Instructions:  *If you need a refill on your cardiac medications before your next appointment, please call your pharmacy*  Lab Work: If you have labs (blood work) drawn today and your tests are completely normal, you will receive your results only by: [name] MyChart Message (if you have MyChart) OR . A paper copy in the mail If you have any lab test that is abnormal or we need to change your treatment, we will call you to review the results.  Testing/Procedures: Your physician has requested that you have an echocardiogram in September. Echocardiography is a painless test that uses sound waves to create images of your heart. It provides your doctor with information about the size and shape of your heart and how well your heart's chambers and valves are working. This procedure takes approximately one hour. There are no restrictions for this procedure.  Your physician has requested that you have a carotid duplex. This test is an ultrasound of the carotid arteries in your neck. It looks at blood flow through these arteries that supply the brain with blood. Allow one hour for this exam. There are no restrictions or special instructions.  Follow-Up: At [facility], you and your health needs are our priority.  As part of our continuing mission to provide you with exceptional heart care, we have created designated Provider Care Teams.  These Care Teams include your primary Cardiologist (physician) and Advanced Practice Providers (APPs -  Physician Assistants and Nurse Practitioners) who all work together to provide you with the care you need, when you need it.  We recommend signing up for the patient portal called "MyChart".  Sign up information is provided on this After Visit Summary.  MyChart is used to connect with patients for Virtual Visits (Telemedicine).  Patients are able to view lab/test results, encounter notes, upcoming appointments, etc.  Non-urgent messages can be sent to your provider  as well.   To learn more about what you can do with MyChart, go to [email or web address].    Your next appointment:   6 month(s)  The format for your next appointment:   In Person  Provider:   You may see Dr. Johnsie Cancel or one of the following Advanced Practice Providers on your designated Care Team:    Kathyrn Drown, NP

## 2020-09-15 ENCOUNTER — Ambulatory Visit (HOSPITAL_COMMUNITY)
Admission: RE | Admit: 2020-09-15 | Discharge: 2020-09-15 | Disposition: A | Discharge status: Home / Self Care | Payer: HMO | Source: Ambulatory Visit | Attending: Cardiovascular Disease | Admitting: Cardiovascular Disease

## 2020-09-15 ENCOUNTER — Other Ambulatory Visit: Payer: Self-pay

## 2020-09-15 DIAGNOSIS — R0989 Other specified symptoms and signs involving the circulatory and respiratory systems: Secondary | ICD-10-CM | POA: Diagnosis not present

## 2020-09-25 DIAGNOSIS — F411 Generalized anxiety disorder: Secondary | ICD-10-CM | POA: Diagnosis not present

## 2020-09-29 ENCOUNTER — Other Ambulatory Visit (HOSPITAL_COMMUNITY): Payer: Self-pay

## 2020-09-29 ENCOUNTER — Other Ambulatory Visit: Payer: Self-pay | Admitting: Internal Medicine

## 2020-09-29 MED FILL — Alprazolam Tab 1 MG: ORAL | 30 days supply | Qty: 60 | Fill #0 | Status: AC

## 2020-10-01 ENCOUNTER — Other Ambulatory Visit (HOSPITAL_COMMUNITY): Payer: Self-pay

## 2020-10-01 MED ORDER — ROSUVASTATIN CALCIUM 10 MG PO TABS
ORAL_TABLET | Freq: Every day | ORAL | 1 refills | Status: DC
  Filled 2020-10-01 – 2021-01-19 (×2): qty 90, 90d supply, fill #0

## 2020-10-07 ENCOUNTER — Ambulatory Visit (INDEPENDENT_AMBULATORY_CARE_PROVIDER_SITE_OTHER): Payer: HMO | Admitting: *Deleted

## 2020-10-07 ENCOUNTER — Ambulatory Visit: Payer: HMO | Admitting: *Deleted

## 2020-10-07 DIAGNOSIS — R5382 Chronic fatigue, unspecified: Secondary | ICD-10-CM

## 2020-10-07 DIAGNOSIS — F419 Anxiety disorder, unspecified: Secondary | ICD-10-CM

## 2020-10-07 DIAGNOSIS — F32A Depression, unspecified: Secondary | ICD-10-CM | POA: Diagnosis not present

## 2020-10-07 NOTE — Patient Instructions (Signed)
Visit Information  PATIENT GOALS: Goals Addressed            This Visit's Progress   . Continue to work with Education officer, museum       Timeframe:  Long-Range Goal Priority:  High Start Date:  07/22/2020                          Expected End Date:     11/11/2020                  Follow Up Date 11/04/2020   - continue with individual therapy/counseling sessions at [facility]  - continue to seek ways to engage with friends, neighbors, old co-workers, Social research officer, government - check into the  volunteer opportunities in more depth  - perform a random act of kindness - practice positive self thoughts - continue social outings to [employer] and senior center engaging with others  -continue to seek activities that are of interest    Why is this important?    When you are stressed, down or upset, your body reacts too.   For example, your blood pressure may get higher; you may have a headache or stomachache.   When your emotions get the best of you, your body's ability to fight off cold and flu gets weak.   These steps will help you manage your emotions.     Notes:        Patient verbalizes understanding of instructions provided today and agrees to view in [place].   Telephone follow up appointment with care management team member scheduled for:11/04/2020  [name] MSW, [place] Licensed Clinical Social Worker [place] [phone]

## 2020-10-07 NOTE — Chronic Care Management (AMB) (Signed)
Chronic Care Management    Clinical Social Work Note  10/07/2020 Name: [name] MRN: [id number] DOB: [birth year]  [name] is a 70 y.o. year old male who is a primary care patient of Sharlet Salina Real Cons, MD. The CCM team was consulted to assist the patient with chronic disease management and/or care coordination needs related to: [employer]  and [employer] and [employer].   Engaged with patient by telephone for follow up visit in response to provider referral for social work chronic care management and care coordination services.   Consent to Services:  The patient was given information about Chronic Care Management services, agreed to services, and gave verbal consent prior to initiation of services.  Please see initial visit note for detailed documentation.   Patient agreed to services and consent obtained.   Assessment: Review of patient past medical history, allergies, medications, and health status, including review of relevant consultants reports was performed today as part of a comprehensive evaluation and provision of chronic care management and care coordination services.     SDOH (Social Determinants of Health) assessments and interventions performed:    Advanced Directives Status: Not addressed in this encounter.  CCM Care Plan  Allergies  Allergen Reactions  . Testosterone Shortness Of Breath    Medication:Androgel Pt states trouble breathing at night  . Codeine Nausea And Vomiting  . Gabapentin     "drunk" feeling  . Ultram [Tramadol] Nausea And Vomiting  . Albuterol Other (See Comments)    Panic attack    Outpatient Encounter Medications as of 10/07/2020  Medication Sig  . ALPRAZolam (XANAX) 1 MG tablet TAKE 1/2 TABLET BY MOUTH 4 TIMES A DAY  . amoxicillin (AMOXIL) 500 MG capsule TAKE 1 CAPSULE BY MOUTH 3 TIMES DAILY UNTIL GONE  . aspirin EC 81 MG tablet Take 81 mg by mouth daily with lunch.  [name] azelastine (ASTELIN) 0.1 %  nasal spray Place 2 sprays into both nostrils 2 (two) times daily. Office visit is due  . Azelastine HCl 137 MCG/SPRAY SOLN PLACE 2 SPRAYS INTO BOTH NOSTRILS 2 (TWO) TIMES DAILY. OFFICE VISIT IS DUE  . bisacodyl (DULCOLAX) 10 MG suppository Place 1 suppository (10 mg total) rectally daily as needed for moderate constipation.  . calcium carbonate (TUMS EX) 750 MG chewable tablet Chew 1 tablet by mouth daily as needed for heartburn.   . Cetirizine HCl (ZYRTEC PO) Take by mouth.  . cholecalciferol (VITAMIN D) 1000 UNITS tablet Take 2,000 Units by mouth daily.  [name] COVID-19 mRNA vaccine, Pfizer, 30 MCG/0.3ML injection USE AS DIRECTED  . EPINEPHRINE 0.3 mg/0.3 mL IJ SOAJ injection INJECT 0.3 MLS INTO THE MUSCLE ONCE AS DIRECTED  . fexofenadine (ALLEGRA) 180 MG tablet Take 90 mg by mouth 2 (two) times daily.  . fluconazole (DIFLUCAN) 150 MG tablet TAKE ONE TABLET BY MOUTH ONCE FOR 1 DOSE  . fluticasone (FLONASE) 50 MCG/ACT nasal spray PLACE 2 SPRAYS INTO BOTH NOSTRILS DAILY.  . hydrochlorothiazide (HYDRODIURIL) 25 MG tablet TAKE 1 TABLET (25 MG TOTAL) BY MOUTH DAILY. ANNUAL APPT DUE IN OCT MUST SEE PROVIDER FOR FUTURE REFILLS  . metFORMIN (GLUCOPHAGE-XR) 500 MG 24 hr tablet TAKE 3 TABLETS (1,500 MG TOTAL) BY MOUTH DAILY WITH BREAKFAST.  . metoprolol tartrate (LOPRESSOR) 25 MG tablet TAKE 1 TABLET (25 MG TOTAL) BY MOUTH 3 (THREE) TIMES DAILY.  . montelukast (SINGULAIR) 10 MG tablet TAKE 1/2 TABLET BY MOUTH 2 TIMES DAILY.  . Multiple Vitamin (MULTIVITAMIN WITH MINERALS) TABS Take 1  tablet by mouth daily.  [name] omega-3 acid ethyl esters (LOVAZA) 1 g capsule TAKE 1 CAPSULE (1 G TOTAL) BY MOUTH 2 (TWO) TIMES DAILY. ANNUAL APPT DUE IN OCT MUST SEE PROVIDER FOR FUTURE REFILLS  . OVER THE COUNTER MEDICATION Apply 1 application topically daily as needed (for acne). Oxy 10 vanishing cream  . polyethylene glycol powder (GLYCOLAX/MIRALAX) powder MIX 17G WITH LIQUID AND DRINK BY MOUTH DAILY.  [name] PREVIDENT 5000 DRY MOUTH  1.1 % GEL dental gel APPLY THIN RIBBON AND BRUSH FOR 2 MINUTES DAILY IN PLACE OF CONVENTIONAL TOOTHPASTE  . rosuvastatin (CRESTOR) 10 MG tablet TAKE 1 TABLET BY MOUTH ONCE DAILY  . sodium fluoride (FLUORISHIELD) 1.1 % GEL dental gel APPLY THIN RIBBON AND BRUSH FOR 2 MINUTES DAILY IN PLACE OF CONVENTIONAL TOOTHPASTE  . thiamine (VITAMIN B-1) 100 MG tablet Take 100 mg by mouth 2 (two) times daily at 10 AM and 5 PM.   . triamcinolone (KENALOG) 0.1 % APPLY ON THE SKIN TWICE A DAY; APPLY TO RIGHT SCALP AREA UNTIL CLEAR  . TURMERIC CURCUMIN PO Take by mouth.  . vitamin B-12 (CYANOCOBALAMIN) 500 MCG tablet Take 500-1,000 mcg by mouth daily.   . [DISCONTINUED] oxyCODONE (OXY IR/ROXICODONE) 5 MG immediate release tablet Take 1 tablet (5 mg total) by mouth every 4 (four) hours as needed.   No facility-administered encounter medications on file as of 10/07/2020.    Patient Active Problem List   Diagnosis Date Noted  . History of aortic dissection 05/28/2020  . Genitourinary complaints 04/04/2020  . Elevated LFTs 03/20/2020  . Benign prostatic hyperplasia without lower urinary tract symptoms 03/20/2020  . Skin lesion of scalp 03/20/2020  . Pseudodementia 03/05/2020  . Cerebral microvascular disease 03/05/2020  . Idiopathic small fiber sensory neuropathy 03/05/2020  . Severe episode of recurrent major depressive disorder, without psychotic features ([place]) 03/05/2020  . Colon cancer screening 10/12/2019  . Coronary artery calcification seen on CAT scan 03/17/2018  . Ischemic brain damage 08/17/2017  . Routine general medical examination at a health care facility 07/18/2015  . Thyroid nodule 04/21/2015  . AAA (abdominal aortic aneurysm) without rupture ([place]) 04/15/2015  . MCI (mild cognitive impairment) 08/26/2014  . Benign paroxysmal positional vertigo 08/26/2014  . Peripheral neuropathy 03/21/2013  . DM2 (diabetes mellitus, type 2) ([place])   . Brachial plexopathy   . Anxiety   . Occlusion and  stenosis of carotid artery without mention of cerebral infarction 04/11/2012  . AAA (abdominal aortic aneurysm) ([place]) 03/17/2012  . Vitamin D deficiency 12/17/2008  . Hyperlipidemia associated with type 2 diabetes mellitus ([place]) 12/17/2008  . OBSTRUCTIVE SLEEP APNEA 12/17/2008  . Migraine 12/17/2008  . ALLERGIC RHINITIS 12/17/2008  . Asthma 12/17/2008  . ERECTILE DYSFUNCTION, ORGANIC 12/17/2008    Conditions to be addressed/monitored: COPD and Depression; Limited social support and Mental Health Concerns   Care Plan : LCSW Plan of Care  Updates made by Deirdre Peer, LCSW since 10/07/2020 12:00 AM    Problem: Need for patient centered plan of care to promote improved quality of life   Priority: High    Long-Range Goal: Develop self management plan to improve quality of life   Start Date: 07/23/2020  Expected End Date: 11/11/2020  This Visit's Progress: On track  Recent Progress: On track  Priority: High  Note:    Current barriers:   . Continued feelings of being alone/isolated "wanting to have meaningful intellectual conversations" . Limited social/support . Concerns about taking SSRI's (side effects) . Limited self  health management plan in place  to address perceived decline in quality of life related to retirement, COVID isolation, etc. . Negative thoughts and feelings around self, relationships and isolation. Clinical Goal(s): patient will work with SW to reduce or manage symptoms related to feelings of decrease in quality of life Clinical Interventions:  . Pt has taken some positive steps toward promoting positive change. He continues to seek opportunities to engage with others socially- he has been able to visit with previous co-workers as well as a neighbor friend a few times. He also was able to help a neighbor friend to develop a power point they needed to do; which pt found rewarding.  Pt pushed himself to go to the mall to look for new jeans; pt reports it was "an  experience with all the types, brands and styles" but was pleased with his accomplishment.  . Pt is encouraged to continue to seek more opportunities for socializing and engaging in activities; that bring him enjoyment, engagement and stimulation. Pt has joined a fraternal lodge for those of [place] ancestors . Pt reports feeling his depression symptoms have lessened; and acknowledges that "getting out" helps. He does, however face some anxiety within settings/events and is encouraged to discuss this further with his counselor.  . Pt reports next counseling session at [facility] is in May; his therapist suggested a book, "Chatter" which he is reading- pt states it is helping him to understand all the "chatter" in hid mind and is hoping it will help him to better understand and benefit from finding ways to ignore or dismiss the chatter (which impacts his sleep,etc) Pt feels he is comfortable and connecting with his therapist; "I got lucky" . Assessed patient's previous treatment, needs, coping skills, current treatment, support system and barriers to care . Patient interviewed and appropriate assessments performed or reviewed:;Suicidal Ideation/Homicidal Ideation: No . Reviewed medications with patient prescribed by PCP and discussed adherence- remains resistant/hesitant to SSRI's due to negative side effects experienced in past  . Other interventions include: Motivational Interviewing, Solution-Focused Strategies, Emotional/Supportive Counseling, Problem Solving, and Referred to psychologist  . Collaboration with PCP regarding development and update of comprehensive plan of care as evidenced by provider attestation and co-signature . Inter-disciplinary care team collaboration (see longitudinal plan of care) Patient Goals/Self-Care Activities:   - continue with individual therapy/counseling sessions at [facility]  - continue to seek ways to engage with friends, neighbors, old  co-workers, Social research officer, government - check into the  volunteer opportunities in more depth  - perform a random act of kindness - practice positive self thoughts - continue social outings to [employer] and senior center engaging with others  -continue to seek activities that are of interest    -Follow Up Plan: 11/04/2020 11:30 am      Follow Up Plan: Appointment scheduled for SW follow up with client by phone on: 11/04/2020      [name] MSW, LCSW Licensed Clinical Social Worker [place] [phone]

## 2020-10-08 ENCOUNTER — Ambulatory Visit: Payer: HMO | Attending: Internal Medicine

## 2020-10-08 DIAGNOSIS — Z23 Encounter for immunization: Secondary | ICD-10-CM

## 2020-10-08 NOTE — Progress Notes (Signed)
   Covid-19 Vaccination Clinic  Name:  SKYLEN SPIERING    MRN: [id number] DOB: [birth year]  10/08/2020  Mr. Driggers was observed post Covid-19 immunization for 15 minutes without incident. He was provided with Vaccine Information Sheet and instruction to access the V-Safe system.   Mr. Levene was instructed to call 911 with any severe reactions post vaccine: [name] Difficulty breathing  . Swelling of face and throat  . A fast heartbeat  . A bad rash all over body  . Dizziness and weakness   Immunizations Administered    Name Date Dose VIS Date Route   PFIZER Comrnaty(Gray TOP) Covid-19 Vaccine 10/08/2020 12:36 PM 0.3 mL 05/22/2020 Intramuscular   Manufacturer: [place]   Lot: [id number]   NDC: [phone]

## 2020-10-13 ENCOUNTER — Other Ambulatory Visit (HOSPITAL_COMMUNITY): Payer: Self-pay

## 2020-10-13 DIAGNOSIS — F411 Generalized anxiety disorder: Secondary | ICD-10-CM | POA: Diagnosis not present

## 2020-10-13 MED ORDER — COVID-19 MRNA VAC-TRIS(PFIZER) 30 MCG/0.3ML IM SUSP
INTRAMUSCULAR | 0 refills | Status: DC
  Filled 2020-10-13: qty 0.3, 17d supply, fill #0

## 2020-10-14 ENCOUNTER — Other Ambulatory Visit (HOSPITAL_COMMUNITY): Payer: Self-pay

## 2020-10-15 ENCOUNTER — Ambulatory Visit (INDEPENDENT_AMBULATORY_CARE_PROVIDER_SITE_OTHER): Payer: HMO | Admitting: Pharmacist

## 2020-10-15 ENCOUNTER — Other Ambulatory Visit: Payer: Self-pay

## 2020-10-15 DIAGNOSIS — F332 Major depressive disorder, recurrent severe without psychotic features: Secondary | ICD-10-CM | POA: Diagnosis not present

## 2020-10-15 DIAGNOSIS — I714 Abdominal aortic aneurysm, without rupture, unspecified: Secondary | ICD-10-CM

## 2020-10-15 DIAGNOSIS — E1140 Type 2 diabetes mellitus with diabetic neuropathy, unspecified: Secondary | ICD-10-CM

## 2020-10-15 DIAGNOSIS — E785 Hyperlipidemia, unspecified: Secondary | ICD-10-CM

## 2020-10-15 DIAGNOSIS — I2510 Atherosclerotic heart disease of native coronary artery without angina pectoris: Secondary | ICD-10-CM | POA: Diagnosis not present

## 2020-10-15 DIAGNOSIS — F32A Depression, unspecified: Secondary | ICD-10-CM | POA: Diagnosis not present

## 2020-10-15 DIAGNOSIS — E1169 Type 2 diabetes mellitus with other specified complication: Secondary | ICD-10-CM | POA: Diagnosis not present

## 2020-10-15 NOTE — Progress Notes (Signed)
Chronic Care Management Pharmacy Note  10/15/2020 Name:  [name] MRN:  [id number] DOB:  1951-05-10  Subjective: [name] is an 70 y.o. year old male who is a primary patient of Hoyt Koch, MD.  The CCM team was consulted for assistance with disease management and care coordination needs.    Engaged with patient by telephone for follow up visit in response to provider referral for pharmacy case management and/or care coordination services.   Consent to Services:  The patient was given information about Chronic Care Management services, agreed to services, and gave verbal consent prior to initiation of services.  Please see initial visit note for detailed documentation.   Patient Care Team: Hoyt Koch, MD as PCP - General (Internal Medicine) Prescott Gum, Collier Salina, MD (Inactive) (Cardiothoracic Surgery) Pennie Banter, RPH-CPP (Pharmacist) Thurman Coyer, DO (Sports Medicine) Dohmeier, Asencion Partridge, MD (Neurology) Deirdre Peer, LCSW as Social Worker (Licensed Clinical Social Worker) Lenord Fellers, Cleaster Corin, [facility] as Pharmacist (Pharmacist)   Patient lives at home alone. He has been isolated due to the pandemic which has affected his mood and outlook.   Recent office visits: 07/22/20 Dr Sharlet Salina OV: c/o migraine w/ atypical features. Concern for TIA vs migraine. Ordered labs and MRI brain. Labs normal. DC'd Farxiga due to pt not taking  03/20/20 Dr Ronnald Ramp OV: chronic f/u. A1c 7.5%, added Farxiga to metformin. Elevated LFTs - advised to get Hep A and B vaccines. Referred to dermatology for scalp lesion.  Recent consult visits: 09/12/20 Dr Johnsie Cancel (cardiology): f/u AAA. Repeat carotid US, plaque stable f/u 2 years.  05/28/20 Dr Prescott Gum (CT surgery): s/p repair of AA dissection, doing well. No changes. Dc'd lexapro d/t pt not taking.  04/04/20 Dr Julien Nordmann (internal medicine): presented with penile burning, pt advised to stop [place] and rx'd Diflucan. UA + for  glucosuria, otherwise normal.  03/11/20 Dr Johnsie Cancel (cardiology): f/u for AAA  03/05/20 Dr Brett Fairy (neurology): f/u for memory concerns. Dx pseudodementia. MOCA 28/30. Rx'd escitalopram 10 mg and referred to depression counseling.  Hospital visits: None in previous 6 months  Objective:  Lab Results  Component Value Date   CREATININE 1.04 07/22/2020   BUN 22 07/22/2020   GFR 74.46 07/22/2020   GFRNONAA >60 06/21/2018   GFRAA >60 06/21/2018   NA 138 07/22/2020   K 4.0 07/22/2020   CALCIUM 9.5 07/22/2020   CO2 30 07/22/2020    Lab Results  Component Value Date/Time   HGBA1C 7.5 (H) 03/20/2020 03:24 PM   HGBA1C 7.7 (H) 06/19/2019 01:57 PM   GFR 74.46 07/22/2020 04:22 PM   GFR 88.97 03/20/2020 03:24 PM   MICROALBUR 0.9 03/20/2020 03:24 PM   MICROALBUR 3.2 (H) 03/19/2019 03:58 PM    Last diabetic Eye exam:  Lab Results  Component Value Date/Time   HMDIABEYEEXA No Retinopathy 06/04/2020 12:00 AM    Last diabetic Foot exam: No results found for: HMDIABFOOTEX   Lab Results  Component Value Date   CHOL 135 03/20/2020   HDL 42.60 03/20/2020   LDLCALC 68 03/20/2020   LDLDIRECT 114 (H) 03/21/2010   TRIG 124.0 03/20/2020   CHOLHDL 3 03/20/2020    Hepatic Function Latest Ref Rng & Units 07/22/2020 03/20/2020 03/19/2019  Total Protein 6.0 - 8.3 g/dL 7.4 7.4 7.2  Albumin 3.5 - 5.2 g/dL 4.4 4.7 4.4  AST 0 - 37 U/L 39(H) 44(H) 49(H)  ALT 0 - 53 U/L 49 59(H) 61(H)  Alk Phosphatase 39 - 117 U/L 29(L)  28(L) 33(L)  Total Bilirubin 0.2 - 1.2 mg/dL 1.3(H) 1.7(H) 1.5(H)  Bilirubin, Direct 0.0 - 0.3 mg/dL - 0.3 -    Lab Results  Component Value Date/Time   TSH 1.06 07/22/2020 04:22 PM   TSH 0.84 03/19/2019 03:58 PM   FREET4 0.90 07/22/2020 04:22 PM   FREET4 0.91 03/19/2019 03:58 PM    CBC Latest Ref Rng & Units 07/22/2020 03/20/2020 03/19/2019  WBC 4.0 - 10.5 K/uL 7.1 6.5 6.3  Hemoglobin 13.0 - 17.0 g/dL 15.7 15.8 15.2  Hematocrit 39.0 - 52.0 % 45.8 45.5 45.3  Platelets 150.0 -  400.0 K/uL 159.0 152.0 157.0    Lab Results  Component Value Date/Time   VD25OH 55.79 07/22/2020 04:22 PM   VD25OH 57.98 03/20/2020 03:24 PM    Clinical ASCVD: Yes  The 10-year ASCVD risk score Mikey Bussing DC Jr., et al., 2013) is: 20.7%   Values used to calculate the score:     Age: 73 years     Sex: Male     Is Non-Hispanic African American: No     Diabetic: Yes     Tobacco smoker: No     Systolic Blood Pressure: 188 mmHg     Is BP treated: Yes     HDL Cholesterol: 42.6 mg/dL     Total Cholesterol: 135 mg/dL    Depression screen [facility] 2/9 07/23/2020 07/22/2020 04/04/2020  Decreased Interest 3 0 1  Down, Depressed, Hopeless 3 0 1  PHQ - 2 Score 6 0 2  Altered sleeping 3 0 1  Tired, decreased energy 3 0 1  Change in appetite 1 0 0  Feeling bad or failure about yourself  1 0 0  Trouble concentrating 3 0 0  Moving slowly or fidgety/restless 0 0 0  Suicidal thoughts 2 0 0  PHQ-9 Score 19 0 4  Difficult doing work/chores Not difficult at all - Not difficult at all  Some recent data might be hidden     Social History   Tobacco Use  Smoking Status Former Smoker  . Packs/day: 0.10  . Types: Cigarettes  . Quit date: 02/18/2012  . Years since quitting: 8.6  Smokeless Tobacco Never Used  Tobacco Comment   10 cigs/day, quit 02/2012 after TAA emergent repair   BP Readings from Last 3 Encounters:  09/12/20 110/68  07/22/20 134/68  05/28/20 140/69   Pulse Readings from Last 3 Encounters:  09/12/20 (!) 59  07/22/20 (!) 58  05/28/20 60   Wt Readings from Last 3 Encounters:  09/12/20 232 lb (105.2 kg)  07/22/20 225 lb 12.8 oz (102.4 kg)  05/28/20 226 lb (102.5 kg)   BMI Readings from Last 3 Encounters:  09/12/20 28.24 kg/m  07/22/20 27.49 kg/m  05/28/20 27.51 kg/m   Assessment/Interventions: Review of patient past medical history, allergies, medications, health status, including review of consultants reports, laboratory and other test data, was performed as part of  comprehensive evaluation and provision of chronic care management services.   SDOH:  (Social Determinants of Health) assessments and interventions performed: Yes   CCM Care Plan  Allergies  Allergen Reactions  . Testosterone Shortness Of Breath    Medication:Androgel Pt states trouble breathing at night  . Codeine Nausea And Vomiting  . Gabapentin     "drunk" feeling  . Ultram [Tramadol] Nausea And Vomiting  . Albuterol Other (See Comments)    Panic attack    Medications Reviewed Today    Reviewed by Charlton Haws, [facility] (Pharmacist) on 10/15/20 at  Agar List Status: <None>  Medication Order Taking? Sig Documenting Provider Last Dose Status Informant  ALPRAZolam (XANAX) 1 MG tablet [id number] Yes TAKE 1/2 TABLET BY MOUTH 4 TIMES A DAY Hoyt Koch, MD Taking Active   amoxicillin (AMOXIL) 500 MG capsule [id number] Yes TAKE 1 CAPSULE BY MOUTH 3 TIMES DAILY UNTIL GONE Locklear, Darryl, DMD Taking Active   aspirin EC 81 MG tablet [id number] Yes Take 81 mg by mouth daily with lunch. [provider] Taking Active Self  azelastine (ASTELIN) 0.1 % nasal spray [id number] Yes Place 2 sprays into both nostrils 2 (two) times daily. Office visit is due Hoyt Koch, MD Taking Active   bisacodyl (DULCOLAX) 10 MG suppository [id number] Yes Place 1 suppository (10 mg total) rectally daily as needed for moderate constipation. Lysbeth Penner, FNP Taking Active   calcium carbonate (TUMS EX) 750 MG chewable tablet [id number] Yes Chew 1 tablet by mouth daily as needed for heartburn.  [provider] Taking Active Self  Cetirizine HCl (ZYRTEC PO) [id number] Yes Take by mouth. [provider] Taking Active   cholecalciferol (VITAMIN D) 1000 UNITS tablet [id number] Yes Take 2,000 Units by mouth daily. Rowe Clack, MD Taking Active Self  COVID-19 mRNA Vac-TriS, Pfizer, SUSP injection [id number] Yes Inject into the muscle. Carlyle Basques, MD Taking Active    EPINEPHRINE 0.3 mg/0.3 mL IJ SOAJ injection [id number] Yes INJECT 0.3 MLS INTO THE MUSCLE ONCE AS DIRECTED Hoyt Koch, MD Taking Active   escitalopram (LEXAPRO) 10 MG tablet [id number] Yes Take 5 mg by mouth daily. X 2 weeks, then increase to 10 mg [provider] Taking Active   fexofenadine (ALLEGRA) 180 MG tablet [id number] Yes Take 90 mg by mouth 2 (two) times daily. [provider] Taking Active Self  fluticasone (FLONASE) 50 MCG/ACT nasal spray [id number] Yes PLACE 2 SPRAYS INTO BOTH NOSTRILS DAILY. Hoyt Koch, MD Taking Active   hydrochlorothiazide (HYDRODIURIL) 25 MG tablet [id number] Yes TAKE 1 TABLET (25 MG TOTAL) BY MOUTH DAILY. ANNUAL APPT DUE IN OCT MUST SEE PROVIDER FOR FUTURE REFILLS Hoyt Koch, MD Taking Active   metFORMIN (GLUCOPHAGE-XR) 500 MG 24 hr tablet [id number] Yes TAKE 3 TABLETS (1,500 MG TOTAL) BY MOUTH DAILY WITH BREAKFAST. Hoyt Koch, MD Taking Active   metoprolol tartrate (LOPRESSOR) 25 MG tablet [id number] Yes TAKE 1 TABLET (25 MG TOTAL) BY MOUTH 3 (THREE) TIMES DAILY. Josue Hector, MD Taking Active   montelukast (SINGULAIR) 10 MG tablet [id number] Yes TAKE 1/2 TABLET BY MOUTH 2 TIMES DAILY. Hoyt Koch, MD Taking Active   Multiple Vitamin (MULTIVITAMIN WITH MINERALS) TABS [id number] Yes Take 1 tablet by mouth daily. [provider] Taking Active Self  omega-3 acid ethyl esters (LOVAZA) 1 g capsule [id number] Yes TAKE 1 CAPSULE (1 G TOTAL) BY MOUTH 2 (TWO) TIMES DAILY. ANNUAL APPT DUE IN OCT MUST SEE PROVIDER FOR FUTURE REFILLS Hoyt Koch, MD Taking Active   OVER THE COUNTER MEDICATION [id number] Yes Apply 1 application topically daily as needed (for acne). Oxy 10 vanishing cream [provider] Taking Active Self        Discontinued 12/05/12 1213 (Error)            Med Note Gar Ponto   Tue Mar 11, 2020  3:29 PM)    polyethylene glycol powder (GLYCOLAX/MIRALAX) powder  [id number] Yes MIX 17G WITH LIQUID AND DRINK BY MOUTH DAILY. Hoyt Koch, MD Taking Active   PREVIDENT 5000 DRY  MOUTH 1.1 % GEL dental gel [id number] Yes APPLY THIN RIBBON AND BRUSH FOR 2 MINUTES DAILY IN PLACE OF CONVENTIONAL TOOTHPASTE [provider] Taking Active   rosuvastatin (CRESTOR) 10 MG tablet [id number] Yes TAKE 1 TABLET BY MOUTH ONCE DAILY Hoyt Koch, MD Taking Active   sodium fluoride (FLUORISHIELD) 1.1 % GEL dental gel [id number] Yes APPLY THIN RIBBON AND BRUSH FOR 2 MINUTES DAILY IN PLACE OF CONVENTIONAL TOOTHPASTE Posey Pronto, DDS Taking Active   thiamine (VITAMIN B-1) 100 MG tablet [id number] Yes Take 100 mg by mouth 2 (two) times daily at 10 AM and 5 PM.  [provider] Taking Active Self  triamcinolone (KENALOG) 0.1 % [id number] Yes APPLY ON THE SKIN TWICE A DAY; APPLY TO RIGHT SCALP AREA UNTIL CLEAR Ulla Gallo, MD Taking Active   [place] [id number] Yes Take by mouth. [provider] Taking Active   vitamin B-12 (CYANOCOBALAMIN) 500 MCG tablet [id number] Yes Take 500-1,000 mcg by mouth daily.  [provider] Taking Active Self          Patient Active Problem List   Diagnosis Date Noted  . History of aortic dissection 05/28/2020  . Genitourinary complaints 04/04/2020  . Elevated LFTs 03/20/2020  . Benign prostatic hyperplasia without lower urinary tract symptoms 03/20/2020  . Skin lesion of scalp 03/20/2020  . Pseudodementia 03/05/2020  . Cerebral microvascular disease 03/05/2020  . Idiopathic small fiber sensory neuropathy 03/05/2020  . Severe episode of recurrent major depressive disorder, without psychotic features ([place]) 03/05/2020  . Colon cancer screening 10/12/2019  . Coronary artery calcification seen on CAT scan 03/17/2018  . Ischemic brain damage 08/17/2017  . Routine general medical examination at a health care facility 07/18/2015  . Thyroid nodule 04/21/2015  . AAA (abdominal aortic  aneurysm) without rupture ([place]) 04/15/2015  . MCI (mild cognitive impairment) 08/26/2014  . Benign paroxysmal positional vertigo 08/26/2014  . Peripheral neuropathy 03/21/2013  . DM2 (diabetes mellitus, type 2) ([place])   . Brachial plexopathy   . Anxiety   . Occlusion and stenosis of carotid artery without mention of cerebral infarction 04/11/2012  . AAA (abdominal aortic aneurysm) ([place]) 03/17/2012  . Vitamin D deficiency 12/17/2008  . Hyperlipidemia associated with type 2 diabetes mellitus ([place]) 12/17/2008  . OBSTRUCTIVE SLEEP APNEA 12/17/2008  . Migraine 12/17/2008  . ALLERGIC RHINITIS 12/17/2008  . Asthma 12/17/2008  . ERECTILE DYSFUNCTION, ORGANIC 12/17/2008    Immunization History  Administered Date(s) Administered  . DTP 06/14/1976, 04/22/2003, 02/27/2006  . Fluad Quad(high Dose 65+) 03/28/2019, 03/20/2020  . H1N1 04/25/2008  . Hepatitis A, Adult 01/28/2016, 08/18/2016  . Hepatitis B 11/03/1990, 12/13/1990, 04/25/1991  . Influenza Split 03/15/2017, 03/27/2019  . Influenza Whole 03/22/2008, 03/28/2014  . Influenza-Unspecified 04/10/2012, 03/06/2015, 03/06/2018  . MMR 04/28/2006, 05/10/2006  . PFIZER Comirnaty(Gray Top)Covid-19 Tri-Sucrose Vaccine 10/08/2020  . PFIZER(Purple Top)SARS-COV-2 Vaccination 06/28/2019, 07/17/2019, 05/07/2020  . Pneumococcal Conjugate-13 03/20/2020  . Pneumococcal Polysaccharide-23 02/11/2011, 03/19/2019  . Td 02/27/2006  . Tdap 02/25/2017  . Typhoid Inactivated 01/28/2016  . Varicella 06/16/1994, 04/28/2006  . Zoster 12/12/2013    Conditions to be addressed/monitored:  Hypertension, Hyperlipidemia, Diabetes, Depression, Anxiety and Neuropathy  Care Plan : [place]  Updates made by Charlton Haws, [place] since 10/15/2020 12:00 AM    Problem: Hypertension, Hyperlipidemia, Diabetes, Depression, Anxiety and Neuropathy   Priority: High    Long-Range Goal: Disease management   Start Date: 07/28/2020  Expected End Date: 01/25/2021   This Visit's Progress: On track  Recent Progress: On track  Priority: High  Note:   Current Barriers:  . Unable to independently monitor therapeutic efficacy . Suboptimal therapeutic regimen for anxiety, diabetes  Pharmacist Clinical Goal(s):  [name] Patient will achieve adherence to monitoring guidelines and medication adherence to achieve therapeutic efficacy . adhere to plan to optimize therapeutic regimen for anxiety, diabetes as evidenced by report of adherence to recommended medication management changes through collaboration with PharmD and provider.   Interventions: . 1:1 collaboration with Hoyt Koch, MD regarding development and update of comprehensive plan of care as evidenced by provider attestation and co-signature . Inter-disciplinary care team collaboration (see longitudinal plan of care) . Comprehensive medication review performed; medication list updated in electronic medical record  Hypertension / AAA (BP goal < 130/80) Controlled - BP is at goal in clinic; pt reports generalized fatigue Current regimen:  ? HCTZ 25 mg daily ? Metoprolol tartrate 25 mg 3 times daily Interventions: ? Discussed BP goals and benefits of medications for prevention of heart attack / stroke ? Counseled on potential beta blocker side effects including fatigue/malaise; advised to discuss reducing metoprolol dose with cardiologist ? Recommend to continue current medication   Hyperlipidemia / CAD /hx stroke(LDL goal < 70) Controlled  - LDL is at goal; pt has hx of of stroke and coronary calcifications on CT scan Current regimen:  ? Rosuvastatin 10 mg daily ? Omega-3 ethyl esters 1 g twice a day ? Aspirin 81 mg  Interventions: ? Discussed cholesterol goals and benefits of medications for prevention of heart attack / stroke; given history of ischemic disease aggressive LDL goal < 70 is appropriate ? Recommend to continue current medication   Diabetes (A1c goal < 7%) Not ideally  controlled - last A1c was above goal from Oct 2021. He did not tolerate Farxiga due to genital infection. Pt has been working on lifestyle modifications. He is considering Rybelsus and plans to discuss with endocrinologist Pt follows with Dr Buddy Duty, endocrine Current regimen:  ? Metformin ER 500 mg - 3 tabs AM Interventions: ? Discussed importance of maintaining sugars at goal to prevent complications of diabetes including kidney damage, retinal damage, and cardiovascular disease ? Discussed possibility of increasing metformin to 2000 mg/day or adding a GLP-1 agonist like Rybelsus (patient assistance available) ? Recommend to continue current medication and discuss Rybelsus with Dr Buddy Duty   Anxiety / Depression Not ideally controlled - pt reports he has made improvements in his life lately; he is trying to get out more and be around people which has helped his overall mood; he denies sadness, he feels loneliness is his main issue Current regimen:  ? Alprazolam 1 mg PRN ? Escitalopram 10 mg - take 1/2 tab x 2 weeks then increase to 1 tab (started 10/14/20) Previously tried amitriptyline, fluoxetine, bupropion, trazodone Interventions: ? Discussed benefits of escitalopram and importance of starting SSRI at low dose. Counseled extensively on SSRI side effects and 4-6 week trial period for efficacy; pt plans to give trial a fair shot ? Recommend to continue current medication  Patient Goals/Self-Care Activities . Patient will:  - take medications as prescribed  - Continue escitalopram for 4-6 weeks for adequate trial - Discuss Rybelsus with endocrinologist. Contact pharmacist for patient assistance if needed.  Follow Up Plan: Telephone follow up appointment with care management team member scheduled for: 3 months      Medication Assistance: None required.  Patient affirms current coverage meets needs.  Patient's preferred pharmacy is:  [facility] [place] [address] [place] [address] Phone: [phone] Fax: [phone]  Uses pill box? Yes Pt endorses 100% compliance  We discussed: Current pharmacy is preferred with insurance plan and patient is satisfied with pharmacy services Patient decided to: Continue current medication management strategy  Care Plan and Follow Up Patient Decision:  Patient agrees to Care Plan and Follow-up.  Plan: Telephone follow up appointment with care management team member scheduled for:  3 months  Charlene Brooke, PharmD, [place], CPP Clinical Pharmacist [facility] at [facility] [phone]

## 2020-10-15 NOTE — Patient Instructions (Signed)
Visit Information  Phone number for Pharmacist: [phone]  Goals Addressed            This Visit's Progress   . Manage My Medicine       Timeframe:  Long-Range Goal Priority:  Medium Start Date:  10/15/20                           Expected End Date:     04/17/21                  Follow Up Date 02/10/21   - call for medicine refill 2 or 3 days before it runs out - call if I am sick and can't take my medicine - keep a list of all the medicines I take; vitamins and herbals too  -Continue escitalopram for 4-6 weeks for adequate trial -Discuss Rybelsus with endocrinologist. Contact pharmacist for patient assistance if needed.   Why is this important?   . These steps will help you keep on track with your medicines.   Notes:       Patient verbalizes understanding of instructions provided today and agrees to view in [place].  Telephone follow up appointment with pharmacy team member scheduled for: 3 months  Charlene Brooke, PharmD, [place], CPP Clinical Pharmacist [facility] at [facility] [phone]

## 2020-10-29 ENCOUNTER — Other Ambulatory Visit (HOSPITAL_COMMUNITY): Payer: Self-pay

## 2020-10-29 ENCOUNTER — Other Ambulatory Visit: Payer: Self-pay | Admitting: Internal Medicine

## 2020-10-30 ENCOUNTER — Other Ambulatory Visit (HOSPITAL_COMMUNITY): Payer: Self-pay

## 2020-10-30 MED ORDER — EPINEPHRINE 0.3 MG/0.3ML IJ SOAJ
INTRAMUSCULAR | 3 refills | Status: DC
  Filled 2020-10-30: qty 2, 2d supply, fill #0

## 2020-10-30 MED ORDER — ALPRAZOLAM 1 MG PO TABS
ORAL_TABLET | ORAL | 5 refills | Status: DC
Start: 2020-10-30 — End: 2021-05-13
  Filled 2020-10-30: qty 60, 30d supply, fill #0
  Filled 2020-12-01: qty 60, 30d supply, fill #1
  Filled 2021-01-05: qty 60, 30d supply, fill #0
  Filled 2021-02-05: qty 60, 30d supply, fill #1
  Filled 2021-03-09: qty 60, 30d supply, fill #2
  Filled 2021-04-09: qty 60, 30d supply, fill #3

## 2020-10-31 ENCOUNTER — Other Ambulatory Visit (HOSPITAL_COMMUNITY): Payer: Self-pay

## 2020-11-04 ENCOUNTER — Ambulatory Visit: Payer: HMO | Admitting: Pharmacist

## 2020-11-04 ENCOUNTER — Other Ambulatory Visit: Payer: Self-pay

## 2020-11-04 ENCOUNTER — Ambulatory Visit: Payer: HMO | Admitting: *Deleted

## 2020-11-04 DIAGNOSIS — R5382 Chronic fatigue, unspecified: Secondary | ICD-10-CM

## 2020-11-04 DIAGNOSIS — F32A Depression, unspecified: Secondary | ICD-10-CM

## 2020-11-04 DIAGNOSIS — E1140 Type 2 diabetes mellitus with diabetic neuropathy, unspecified: Secondary | ICD-10-CM

## 2020-11-04 DIAGNOSIS — F332 Major depressive disorder, recurrent severe without psychotic features: Secondary | ICD-10-CM | POA: Diagnosis not present

## 2020-11-04 DIAGNOSIS — F419 Anxiety disorder, unspecified: Secondary | ICD-10-CM

## 2020-11-04 DIAGNOSIS — E1169 Type 2 diabetes mellitus with other specified complication: Secondary | ICD-10-CM | POA: Diagnosis not present

## 2020-11-04 DIAGNOSIS — I2510 Atherosclerotic heart disease of native coronary artery without angina pectoris: Secondary | ICD-10-CM | POA: Diagnosis not present

## 2020-11-04 DIAGNOSIS — E785 Hyperlipidemia, unspecified: Secondary | ICD-10-CM | POA: Diagnosis not present

## 2020-11-04 NOTE — Progress Notes (Signed)
Chronic Care Management Pharmacy Note  11/06/2020 Name:  [name] MRN:  [id number] DOB:  28-Sep-1950  Subjective: [name] is an 70 y.o. year old male who is a primary patient of Hoyt Koch, MD.  The CCM team was consulted for assistance with disease management and care coordination needs.    Engaged with patient by telephone for follow up visit in response to provider referral for pharmacy case management and/or care coordination services.   Consent to Services:  The patient was given information about Chronic Care Management services, agreed to services, and gave verbal consent prior to initiation of services.  Please see initial visit note for detailed documentation.   Patient Care Team: Hoyt Koch, MD as PCP - General (Internal Medicine) Prescott Gum, Collier Salina, MD (Inactive) (Cardiothoracic Surgery) Pennie Banter, RPH-CPP (Pharmacist) Thurman Coyer, DO (Sports Medicine) Dohmeier, Asencion Partridge, MD (Neurology) Deirdre Peer, LCSW as Social Worker (Licensed Clinical Social Worker) Lenord Fellers, Cleaster Corin, [facility] as Pharmacist (Pharmacist)   Patient lives at home alone. He has been isolated due to the pandemic which has affected his mood and outlook.   Recent office visits: 07/22/20 Dr Sharlet Salina OV: c/o migraine w/ atypical features. Concern for TIA vs migraine. Ordered labs and MRI brain. Labs normal. DC'd Farxiga due to pt not taking  03/20/20 Dr Ronnald Ramp OV: chronic f/u. A1c 7.5%, added Farxiga to metformin. Elevated LFTs - advised to get Hep A and B vaccines. Referred to dermatology for scalp lesion.  Recent consult visits: 09/12/20 Dr Johnsie Cancel (cardiology): f/u AAA. Repeat carotid US, plaque stable f/u 2 years.  05/28/20 Dr Prescott Gum (CT surgery): s/p repair of AA dissection, doing well. No changes. Dc'd lexapro d/t pt not taking.  04/04/20 Dr Julien Nordmann (internal medicine): presented with penile burning, pt advised to stop [place] and rx'd Diflucan. UA + for  glucosuria, otherwise normal.  03/11/20 Dr Johnsie Cancel (cardiology): f/u for AAA  03/05/20 Dr Brett Fairy (neurology): f/u for memory concerns. Dx pseudodementia. MOCA 28/30. Rx'd escitalopram 10 mg and referred to depression counseling.  Hospital visits: None in previous 6 months  Objective:  Lab Results  Component Value Date   CREATININE 1.04 07/22/2020   BUN 22 07/22/2020   GFR 74.46 07/22/2020   GFRNONAA >60 06/21/2018   GFRAA >60 06/21/2018   NA 138 07/22/2020   K 4.0 07/22/2020   CALCIUM 9.5 07/22/2020   CO2 30 07/22/2020    Lab Results  Component Value Date/Time   HGBA1C 7.5 (H) 03/20/2020 03:24 PM   HGBA1C 7.7 (H) 06/19/2019 01:57 PM   GFR 74.46 07/22/2020 04:22 PM   GFR 88.97 03/20/2020 03:24 PM   MICROALBUR 0.9 03/20/2020 03:24 PM   MICROALBUR 3.2 (H) 03/19/2019 03:58 PM    Last diabetic Eye exam:  Lab Results  Component Value Date/Time   HMDIABEYEEXA No Retinopathy 06/04/2020 12:00 AM    Last diabetic Foot exam: No results found for: HMDIABFOOTEX   Lab Results  Component Value Date   CHOL 135 03/20/2020   HDL 42.60 03/20/2020   LDLCALC 68 03/20/2020   LDLDIRECT 114 (H) 03/21/2010   TRIG 124.0 03/20/2020   CHOLHDL 3 03/20/2020    Hepatic Function Latest Ref Rng & Units 07/22/2020 03/20/2020 03/19/2019  Total Protein 6.0 - 8.3 g/dL 7.4 7.4 7.2  Albumin 3.5 - 5.2 g/dL 4.4 4.7 4.4  AST 0 - 37 U/L 39(H) 44(H) 49(H)  ALT 0 - 53 U/L 49 59(H) 61(H)  Alk Phosphatase 39 - 117 U/L 29(L) 28(L)  33(L)  Total Bilirubin 0.2 - 1.2 mg/dL 1.3(H) 1.7(H) 1.5(H)  Bilirubin, Direct 0.0 - 0.3 mg/dL - 0.3 -    Lab Results  Component Value Date/Time   TSH 1.06 07/22/2020 04:22 PM   TSH 0.84 03/19/2019 03:58 PM   FREET4 0.90 07/22/2020 04:22 PM   FREET4 0.91 03/19/2019 03:58 PM    CBC Latest Ref Rng & Units 07/22/2020 03/20/2020 03/19/2019  WBC 4.0 - 10.5 K/uL 7.1 6.5 6.3  Hemoglobin 13.0 - 17.0 g/dL 15.7 15.8 15.2  Hematocrit 39.0 - 52.0 % 45.8 45.5 45.3  Platelets 150.0 -  400.0 K/uL 159.0 152.0 157.0    Lab Results  Component Value Date/Time   VD25OH 55.79 07/22/2020 04:22 PM   VD25OH 57.98 03/20/2020 03:24 PM    Clinical ASCVD: Yes  The 10-year ASCVD risk score Mikey Bussing DC Jr., et al., 2013) is: 20.7%   Values used to calculate the score:     Age: 41 years     Sex: Male     Is Non-Hispanic African American: No     Diabetic: Yes     Tobacco smoker: No     Systolic Blood Pressure: 017 mmHg     Is BP treated: Yes     HDL Cholesterol: 42.6 mg/dL     Total Cholesterol: 135 mg/dL    Depression screen [facility] 2/9 11/04/2020 07/23/2020 07/22/2020  Decreased Interest 2 3 0  Down, Depressed, Hopeless 0 3 0  PHQ - 2 Score 2 6 0  Altered sleeping 3 3 0  Tired, decreased energy 3 3 0  Change in appetite 1 1 0  Feeling bad or failure about yourself  0 1 0  Trouble concentrating 1 3 0  Moving slowly or fidgety/restless 1 0 0  Suicidal thoughts 1 2 0  PHQ-9 Score 12 19 0  Difficult doing work/chores Not difficult at all Not difficult at all -  Some recent data might be hidden     Social History   Tobacco Use  Smoking Status Former Smoker  . Packs/day: 0.10  . Types: Cigarettes  . Quit date: 02/18/2012  . Years since quitting: 8.7  Smokeless Tobacco Never Used  Tobacco Comment   10 cigs/day, quit 02/2012 after TAA emergent repair   BP Readings from Last 3 Encounters:  09/12/20 110/68  07/22/20 134/68  05/28/20 140/69   Pulse Readings from Last 3 Encounters:  09/12/20 (!) 59  07/22/20 (!) 58  05/28/20 60   Wt Readings from Last 3 Encounters:  09/12/20 232 lb (105.2 kg)  07/22/20 225 lb 12.8 oz (102.4 kg)  05/28/20 226 lb (102.5 kg)   BMI Readings from Last 3 Encounters:  09/12/20 28.24 kg/m  07/22/20 27.49 kg/m  05/28/20 27.51 kg/m   Assessment/Interventions: Review of patient past medical history, allergies, medications, health status, including review of consultants reports, laboratory and other test data, was performed as part of  comprehensive evaluation and provision of chronic care management services.   SDOH:  (Social Determinants of Health) assessments and interventions performed: Yes   CCM Care Plan  Allergies  Allergen Reactions  . Testosterone Shortness Of Breath    Medication:Androgel Pt states trouble breathing at night  . Codeine Nausea And Vomiting  . Gabapentin     "drunk" feeling  . Ultram [Tramadol] Nausea And Vomiting  . Albuterol Other (See Comments)    Panic attack    Medications Reviewed Today    Reviewed by Charlton Haws, [facility] (Pharmacist) on 11/04/20 at 1521  Med List Status: <None>  Medication Order Taking? Sig Documenting Provider Last Dose Status Informant  ALPRAZolam (XANAX) 1 MG tablet [id number] Yes TAKE 1/2 TABLET BY MOUTH 4 TIMES A DAY Hoyt Koch, MD Taking Active   amoxicillin (AMOXIL) 500 MG capsule [id number] Yes TAKE 1 CAPSULE BY MOUTH 3 TIMES DAILY UNTIL GONE Locklear, Darryl, DMD Taking Active   aspirin EC 81 MG tablet [id number] Yes Take 81 mg by mouth daily with lunch. [provider] Taking Active Self  azelastine (ASTELIN) 0.1 % nasal spray [id number] Yes Place 2 sprays into both nostrils 2 (two) times daily. Office visit is due Hoyt Koch, MD Taking Active   bisacodyl (DULCOLAX) 10 MG suppository [id number] Yes Place 1 suppository (10 mg total) rectally daily as needed for moderate constipation. Lysbeth Penner, FNP Taking Active   calcium carbonate (TUMS EX) 750 MG chewable tablet [id number] Yes Chew 1 tablet by mouth daily as needed for heartburn.  [provider] Taking Active Self  Cetirizine HCl (ZYRTEC PO) [id number] Yes Take by mouth. [provider] Taking Active   cholecalciferol (VITAMIN D) 1000 UNITS tablet [id number] Yes Take 2,000 Units by mouth daily. Rowe Clack, MD Taking Active Self  COVID-19 mRNA Vac-TriS, Pfizer, SUSP injection [id number] Yes Inject into the muscle. Carlyle Basques, MD Taking Active    EPINEPHrine 0.3 mg/0.3 mL IJ SOAJ injection [id number] Yes INJECT 0.3 MLS INTO THE MUSCLE ONCE AS DIRECTED Hoyt Koch, MD Taking Active   fexofenadine (ALLEGRA) 180 MG tablet [id number] Yes Take 90 mg by mouth 2 (two) times daily. [provider] Taking Active Self  fluticasone (FLONASE) 50 MCG/ACT nasal spray [id number] Yes PLACE 2 SPRAYS INTO BOTH NOSTRILS DAILY. Hoyt Koch, MD Taking Active   hydrochlorothiazide (HYDRODIURIL) 25 MG tablet [id number] Yes TAKE 1 TABLET (25 MG TOTAL) BY MOUTH DAILY. ANNUAL APPT DUE IN OCT MUST SEE PROVIDER FOR FUTURE REFILLS Hoyt Koch, MD Taking Active   metFORMIN (GLUCOPHAGE-XR) 500 MG 24 hr tablet [id number] Yes TAKE 3 TABLETS (1,500 MG TOTAL) BY MOUTH DAILY WITH BREAKFAST. Hoyt Koch, MD Taking Active   metoprolol tartrate (LOPRESSOR) 25 MG tablet [id number] Yes TAKE 1 TABLET (25 MG TOTAL) BY MOUTH 3 (THREE) TIMES DAILY. Josue Hector, MD Taking Active   montelukast (SINGULAIR) 10 MG tablet [id number] Yes TAKE 1/2 TABLET BY MOUTH 2 TIMES DAILY. Hoyt Koch, MD Taking Active   Multiple Vitamin (MULTIVITAMIN WITH MINERALS) TABS [id number] Yes Take 1 tablet by mouth daily. [provider] Taking Active Self  omega-3 acid ethyl esters (LOVAZA) 1 g capsule [id number] Yes TAKE 1 CAPSULE (1 G TOTAL) BY MOUTH 2 (TWO) TIMES DAILY. ANNUAL APPT DUE IN OCT MUST SEE PROVIDER FOR FUTURE REFILLS Hoyt Koch, MD Taking Active   OVER THE COUNTER MEDICATION [id number] Yes Apply 1 application topically daily as needed (for acne). Oxy 10 vanishing cream [provider] Taking Active Self        Discontinued 12/05/12 1213 (Error)            Med Note Gar Ponto   Tue Mar 11, 2020  3:29 PM)    polyethylene glycol powder (GLYCOLAX/MIRALAX) powder [id number] Yes MIX 17G WITH LIQUID AND DRINK BY MOUTH DAILY. Hoyt Koch, MD Taking Active   PREVIDENT 5000 DRY MOUTH 1.1 % GEL dental gel  [id number] Yes APPLY THIN RIBBON AND BRUSH FOR 2 MINUTES DAILY IN PLACE OF CONVENTIONAL TOOTHPASTE [provider] Taking Active   rosuvastatin (  CRESTOR) 10 MG tablet [id number] Yes TAKE 1 TABLET BY MOUTH ONCE DAILY Hoyt Koch, MD Taking Active   sodium fluoride (FLUORISHIELD) 1.1 % GEL dental gel [id number] Yes APPLY THIN RIBBON AND BRUSH FOR 2 MINUTES DAILY IN PLACE OF CONVENTIONAL TOOTHPASTE Posey Pronto, DDS Taking Active   thiamine (VITAMIN B-1) 100 MG tablet [id number] Yes Take 100 mg by mouth 2 (two) times daily at 10 AM and 5 PM.  [provider] Taking Active Self  triamcinolone (KENALOG) 0.1 % [id number] Yes APPLY ON THE SKIN TWICE A DAY; APPLY TO RIGHT SCALP AREA UNTIL CLEAR Ulla Gallo, MD Taking Active   [place] [id number] Yes Take by mouth. [provider] Taking Active   vitamin B-12 (CYANOCOBALAMIN) 500 MCG tablet [id number] Yes Take 500-1,000 mcg by mouth daily.  [provider] Taking Active Self          Patient Active Problem List   Diagnosis Date Noted  . History of aortic dissection 05/28/2020  . Genitourinary complaints 04/04/2020  . Elevated LFTs 03/20/2020  . Benign prostatic hyperplasia without lower urinary tract symptoms 03/20/2020  . Skin lesion of scalp 03/20/2020  . Pseudodementia 03/05/2020  . Cerebral microvascular disease 03/05/2020  . Idiopathic small fiber sensory neuropathy 03/05/2020  . Severe episode of recurrent major depressive disorder, without psychotic features ([place]) 03/05/2020  . Colon cancer screening 10/12/2019  . Coronary artery calcification seen on CAT scan 03/17/2018  . Ischemic brain damage 08/17/2017  . Routine general medical examination at a health care facility 07/18/2015  . Thyroid nodule 04/21/2015  . AAA (abdominal aortic aneurysm) without rupture ([place]) 04/15/2015  . MCI (mild cognitive impairment) 08/26/2014  . Benign paroxysmal positional vertigo 08/26/2014  .  Peripheral neuropathy 03/21/2013  . DM2 (diabetes mellitus, type 2) ([place])   . Brachial plexopathy   . Anxiety   . Occlusion and stenosis of carotid artery without mention of cerebral infarction 04/11/2012  . AAA (abdominal aortic aneurysm) ([place]) 03/17/2012  . Vitamin D deficiency 12/17/2008  . Hyperlipidemia associated with type 2 diabetes mellitus ([place]) 12/17/2008  . OBSTRUCTIVE SLEEP APNEA 12/17/2008  . Migraine 12/17/2008  . ALLERGIC RHINITIS 12/17/2008  . Asthma 12/17/2008  . ERECTILE DYSFUNCTION, ORGANIC 12/17/2008    Immunization History  Administered Date(s) Administered  . DTP 06/14/1976, 04/22/2003, 02/27/2006  . Fluad Quad(high Dose 65+) 03/28/2019, 03/20/2020  . H1N1 04/25/2008  . Hepatitis A, Adult 01/28/2016, 08/18/2016  . Hepatitis B 11/03/1990, 12/13/1990, 04/25/1991  . Influenza Split 03/15/2017, 03/27/2019  . Influenza Whole 03/22/2008, 03/28/2014  . Influenza-Unspecified 04/10/2012, 03/06/2015, 03/06/2018  . MMR 04/28/2006, 05/10/2006  . PFIZER Comirnaty(Gray Top)Covid-19 Tri-Sucrose Vaccine 10/08/2020  . PFIZER(Purple Top)SARS-COV-2 Vaccination 06/28/2019, 07/17/2019, 05/07/2020  . Pneumococcal Conjugate-13 03/20/2020  . Pneumococcal Polysaccharide-23 02/11/2011, 03/19/2019  . Td 02/27/2006  . Tdap 02/25/2017  . Typhoid Inactivated 01/28/2016  . Varicella 06/16/1994, 04/28/2006  . Zoster 12/12/2013    Conditions to be addressed/monitored:  Hypertension, Hyperlipidemia, Diabetes, Depression, Anxiety and Neuropathy  Care Plan : [place]  Updates made by Charlton Haws, [place] since 11/06/2020 12:00 AM    Problem: Hypertension, Hyperlipidemia, Diabetes, Depression, Anxiety and Neuropathy   Priority: High    Long-Range Goal: Disease management   Start Date: 07/28/2020  Expected End Date: 01/25/2021  This Visit's Progress: On track  Recent Progress: On track  Priority: High  Note:   Current Barriers:  . Unable to independently monitor  therapeutic efficacy  Pharmacist Clinical Goal(s):  [name] Patient will  achieve adherence to monitoring guidelines and medication adherence to achieve therapeutic efficacy . adhere to plan to optimize therapeutic regimen for anxiety, diabetes as evidenced by report of adherence to recommended medication management changes through collaboration with PharmD and provider.   Interventions: . 1:1 collaboration with Hoyt Koch, MD regarding development and update of comprehensive plan of care as evidenced by provider attestation and co-signature . Inter-disciplinary care team collaboration (see longitudinal plan of care) . Comprehensive medication review performed; medication list updated in electronic medical record  Hypertension / AAA (BP goal < 130/80) Controlled - BP is at goal in clinic; pt reports generalized fatigue Current regimen:  . HCTZ 25 mg daily . Metoprolol tartrate 25 mg 3 times daily Interventions: ? Discussed BP goals and benefits of medications for prevention of heart attack / stroke ? Counseled on potential beta blocker side effects including fatigue/malaise; advised to discuss reducing metoprolol dose with cardiologist ? Recommend to continue current medication   Hyperlipidemia / CAD /hx stroke(LDL goal < 70) Controlled  - LDL is at goal; pt has hx of of stroke and coronary calcifications on CT scan Current regimen:  . Rosuvastatin 10 mg daily . Omega-3 ethyl esters 1 g twice a day . Aspirin 81 mg  Interventions: ? Discussed cholesterol goals and benefits of medications for prevention of heart attack / stroke; given history of ischemic disease aggressive LDL goal < 70 is appropriate ? Recommend to continue current medication   Diabetes (A1c goal < 7%) Not ideally controlled - last A1c was above goal from Oct 2021. He did not tolerate Farxiga due to genital infection. Pt has been working on lifestyle modifications. He is considering Rybelsus and plans to discuss  with endocrinologist Pt follows with Dr Buddy Duty, endocrine Current regimen:  . Metformin ER 500 mg - 3 tabs AM Interventions: ? Discussed importance of maintaining sugars at goal to prevent complications of diabetes including kidney damage, retinal damage, and cardiovascular disease ? Discussed possibility of increasing metformin to 2000 mg/day or adding a GLP-1 agonist like Rybelsus (patient assistance available) ? Recommend to continue current medication and discuss Rybelsus with Dr Buddy Duty   Anxiety / Depression Improving- pt reports he has made improvements in his life lately; he is trying to get out more and be around people which has helped his overall mood; he denies sadness, he feels loneliness is his main issue;  -Pt tried escitalopram 5 mg for ~3 weeks, he experienced a myriad of side effects including dry mouth, agitation, drowsiness, sexual side effects, migraines, decreased appetite, negative thoughts about death (siblings dying, existential dread) Current regimen:  . Alprazolam 1 mg PRN . Escitalopram 10 mg - take 1/2 tab x 2 weeks then increase to 1 tab (started 10/14/20) Previously tried amitriptyline, fluoxetine, bupropion, trazodone Interventions: ? Discussed treatment of depression with therapy/non-drug interventions, using pharmacotherapy as one tool of many; if he cannot tolerate medication it is more beneficial to stop escitalopram and continue with non-drug therapy ? Recommended to stop escitalopram due to side effects  Patient Goals/Self-Care Activities . Patient will:  - take medications as prescribed  - Stop escitalopram. Contact Psychiatrist or PCP if you want to try medication again - Discuss Rybelsus with endocrinologist. Contact pharmacist for patient assistance if needed.     Medication Assistance: None required.  Patient affirms current coverage meets needs.  Patient's preferred pharmacy is:  [facility] at [place]. [address], [place] [address] Phone: [phone] Fax: [phone]  Uses pill box? Yes Pt endorses 100% compliance  We discussed: Current pharmacy is preferred with insurance plan and patient is satisfied with pharmacy services Patient decided to: Continue current medication management strategy  Care Plan and Follow Up Patient Decision:  Patient agrees to Care Plan and Follow-up.  Plan: Telephone follow up appointment with care management team member scheduled for:  3 months  Charlene Brooke, PharmD, [place], CPP Clinical Pharmacist [facility] [phone]

## 2020-11-04 NOTE — Patient Instructions (Signed)
Visit Information  PATIENT GOALS: Goals Addressed            This Visit's Progress   . Continue to work with Education officer, museum       Timeframe:  Long-Range Goal Priority:  High Start Date:  07/22/2020                          Expected End Date:     02/11/2021                  Follow Up Date 12/09/2020   -discuss medication concerns with PCP, Pharmacist and with Psychiatrist -call 911 for any emergency/crisis needs - continue with individual therapy/counseling sessions   - continue to seek ways to engage with friends, neighbors, old co-workers, Social research officer, government - check into the  volunteer opportunities in more depth  - perform a random act of kindness - practice positive self thoughts - continue social outings to [employer] and senior center engaging with others  -continue to seek activities that are of interest    Why is this important?    When you are stressed, down or upset, your body reacts too.   For example, your blood pressure may get higher; you may have a headache or stomachache.   When your emotions get the best of you, your body's ability to fight off cold and flu gets weak.   These steps will help you manage your emotions.     Notes:        Patient verbalizes understanding of instructions provided today and agrees to view in [place].   Telephone follow up appointment with care management team member scheduled for:12/09/2020  [name] MSW, [place] Licensed Clinical Social Worker [place] [phone]

## 2020-11-04 NOTE — Chronic Care Management (AMB) (Signed)
Chronic Care Management    Clinical Social Work Note  11/04/2020 Name: [name] MRN: [id number] DOB: Jun 19, 1950  [name] is a 70 y.o. year old male who is a primary care patient of Sharlet [place] Real Cons, MD. The CCM team was consulted to assist the patient with chronic disease management and/or care coordination needs related to: Mental Health Counseling and Resources.   Engaged with patient by telephone for follow up visit in response to provider referral for social work chronic care management and care coordination services.   Consent to Services:  The patient was given information about Chronic Care Management services, agreed to services, and gave verbal consent prior to initiation of services.  Please see initial visit note for detailed documentation.   Patient agreed to services and consent obtained.   Assessment: Review of patient past medical history, allergies, medications, and health status, including review of relevant consultants reports was performed today as part of a comprehensive evaluation and provision of chronic care management and care coordination services.     SDOH (Social Determinants of Health) assessments and interventions performed:  SDOH Interventions   Flowsheet Row Most Recent Value  SDOH Interventions   Depression Interventions/Treatment  Medication, Currently on Treatment, Counseling       Advanced Directives Status: Not addressed in this encounter.  CCM Care Plan  Allergies  Allergen Reactions  . Testosterone Shortness Of Breath    Medication:Androgel Pt states trouble breathing at night  . Codeine Nausea And Vomiting  . Gabapentin     "drunk" feeling  . Ultram [Tramadol] Nausea And Vomiting  . Albuterol Other (See Comments)    Panic attack    Outpatient Encounter Medications as of 11/04/2020  Medication Sig  . ALPRAZolam (XANAX) 1 MG tablet TAKE 1/2 TABLET BY MOUTH 4 TIMES A DAY  . amoxicillin (AMOXIL) 500 MG capsule TAKE  1 CAPSULE BY MOUTH 3 TIMES DAILY UNTIL GONE  . aspirin EC 81 MG tablet Take 81 mg by mouth daily with lunch.  [name] azelastine (ASTELIN) 0.1 % nasal spray Place 2 sprays into both nostrils 2 (two) times daily. Office visit is due  . bisacodyl (DULCOLAX) 10 MG suppository Place 1 suppository (10 mg total) rectally daily as needed for moderate constipation.  . calcium carbonate (TUMS EX) 750 MG chewable tablet Chew 1 tablet by mouth daily as needed for heartburn.   . Cetirizine HCl (ZYRTEC PO) Take by mouth.  . cholecalciferol (VITAMIN D) 1000 UNITS tablet Take 2,000 Units by mouth daily.  [name] COVID-19 mRNA Vac-TriS, Pfizer, SUSP injection Inject into the muscle.  [name] EPINEPHrine 0.3 mg/0.3 mL IJ SOAJ injection INJECT 0.3 MLS INTO THE MUSCLE ONCE AS DIRECTED  . escitalopram (LEXAPRO) 10 MG tablet Take 5 mg by mouth daily. X 2 weeks, then increase to 10 mg  . fexofenadine (ALLEGRA) 180 MG tablet Take 90 mg by mouth 2 (two) times daily.  . fluticasone (FLONASE) 50 MCG/ACT nasal spray PLACE 2 SPRAYS INTO BOTH NOSTRILS DAILY.  . hydrochlorothiazide (HYDRODIURIL) 25 MG tablet TAKE 1 TABLET (25 MG TOTAL) BY MOUTH DAILY. ANNUAL APPT DUE IN OCT MUST SEE PROVIDER FOR FUTURE REFILLS  . metFORMIN (GLUCOPHAGE-XR) 500 MG 24 hr tablet TAKE 3 TABLETS (1,500 MG TOTAL) BY MOUTH DAILY WITH BREAKFAST.  . metoprolol tartrate (LOPRESSOR) 25 MG tablet TAKE 1 TABLET (25 MG TOTAL) BY MOUTH 3 (THREE) TIMES DAILY.  . montelukast (SINGULAIR) 10 MG tablet TAKE 1/2 TABLET BY MOUTH 2 TIMES DAILY.  . Multiple Vitamin (  MULTIVITAMIN WITH MINERALS) TABS Take 1 tablet by mouth daily.  [name] omega-3 acid ethyl esters (LOVAZA) 1 g capsule TAKE 1 CAPSULE (1 G TOTAL) BY MOUTH 2 (TWO) TIMES DAILY. ANNUAL APPT DUE IN OCT MUST SEE PROVIDER FOR FUTURE REFILLS  . OVER THE COUNTER MEDICATION Apply 1 application topically daily as needed (for acne). Oxy 10 vanishing cream  . polyethylene glycol powder (GLYCOLAX/MIRALAX) powder MIX 17G WITH LIQUID AND DRINK  BY MOUTH DAILY.  [name] PREVIDENT 5000 DRY MOUTH 1.1 % GEL dental gel APPLY THIN RIBBON AND BRUSH FOR 2 MINUTES DAILY IN PLACE OF CONVENTIONAL TOOTHPASTE  . rosuvastatin (CRESTOR) 10 MG tablet TAKE 1 TABLET BY MOUTH ONCE DAILY  . sodium fluoride (FLUORISHIELD) 1.1 % GEL dental gel APPLY THIN RIBBON AND BRUSH FOR 2 MINUTES DAILY IN PLACE OF CONVENTIONAL TOOTHPASTE  . thiamine (VITAMIN B-1) 100 MG tablet Take 100 mg by mouth 2 (two) times daily at 10 AM and 5 PM.   . triamcinolone (KENALOG) 0.1 % APPLY ON THE SKIN TWICE A DAY; APPLY TO RIGHT SCALP AREA UNTIL CLEAR  . TURMERIC CURCUMIN PO Take by mouth.  . vitamin B-12 (CYANOCOBALAMIN) 500 MCG tablet Take 500-1,000 mcg by mouth daily.   . [DISCONTINUED] oxyCODONE (OXY IR/ROXICODONE) 5 MG immediate release tablet Take 1 tablet (5 mg total) by mouth every 4 (four) hours as needed.   No facility-administered encounter medications on file as of 11/04/2020.    Patient Active Problem List   Diagnosis Date Noted  . History of aortic dissection 05/28/2020  . Genitourinary complaints 04/04/2020  . Elevated LFTs 03/20/2020  . Benign prostatic hyperplasia without lower urinary tract symptoms 03/20/2020  . Skin lesion of scalp 03/20/2020  . Pseudodementia 03/05/2020  . Cerebral microvascular disease 03/05/2020  . Idiopathic small fiber sensory neuropathy 03/05/2020  . Severe episode of recurrent major depressive disorder, without psychotic features ([place]) 03/05/2020  . Colon cancer screening 10/12/2019  . Coronary artery calcification seen on CAT scan 03/17/2018  . Ischemic brain damage 08/17/2017  . Routine general medical examination at a health care facility 07/18/2015  . Thyroid nodule 04/21/2015  . AAA (abdominal aortic aneurysm) without rupture ([place]) 04/15/2015  . MCI (mild cognitive impairment) 08/26/2014  . Benign paroxysmal positional vertigo 08/26/2014  . Peripheral neuropathy 03/21/2013  . DM2 (diabetes mellitus, type 2) ([place])   . Brachial  plexopathy   . Anxiety   . Occlusion and stenosis of carotid artery without mention of cerebral infarction 04/11/2012  . AAA (abdominal aortic aneurysm) ([place]) 03/17/2012  . Vitamin D deficiency 12/17/2008  . Hyperlipidemia associated with type 2 diabetes mellitus ([place]) 12/17/2008  . OBSTRUCTIVE SLEEP APNEA 12/17/2008  . Migraine 12/17/2008  . ALLERGIC RHINITIS 12/17/2008  . Asthma 12/17/2008  . ERECTILE DYSFUNCTION, ORGANIC 12/17/2008    Conditions to be addressed/monitored: Anxiety, Depression and medication concerns; Mental Health Concerns   Care Plan : LCSW Plan of Care  Updates made by Deirdre Peer, LCSW since 11/04/2020 12:00 AM    Problem: Need for patient centered plan of care to promote improved quality of life   Priority: High    Long-Range Goal: Develop self management plan to improve quality of life   Start Date: 07/23/2020  Expected End Date: 11/11/2020  This Visit's Progress: On track  Recent Progress: On track  Priority: High  Note:    Current barriers:   . Recent negative side effects pt thinks may be due to new anti-depressant started about 3 weeks ago . Continued feelings  of being alone/isolated "wanting to have meaningful intellectual conversations" . Limited social/support . Limited self health management plan in place  to address perceived decline in quality of life related to retirement, COVID isolation, etc. . Negative thoughts and feelings around self, relationships and isolation. Clinical Goal(s): patient will work with SW to reduce or manage symptoms related to feelings of decrease in quality of life Clinical Interventions:  . Pt reports today that the medication he started taking about 3 weeks ago (    has contributed to numerous negative side effects that he feels are due to the RX.  Pt reports "dizziness, dry mouth, sleepy that is different from drowsiness, along with some agitation, outbursts, increased worry, nervousness and even some suicidal  thoughts".  Pt denies any current SI and a PHQ9 along with Suicide screening was done today.  Pt shared that he does not and would not "do it" but has thought about "why am I still here" along with "dark thoughts about dying and his children, etc...." CSW listened, offered validations and praise for his awareness and actions to acknowledge the issues.  CSW also reminded pt of all the positive steps he has made to expand and explore within his community to seek new friends and activities to participate in and to enhance his desire to "belong, feel loved and cared for".   Pt continues to not feel he has depression or loneliness; but more of a being "alone-ness".  CSW encouraged him to continue to seek opportunities to meet individuals as well as activities he has interest in. Pt shares he met someone at a coffee shop who is also into the stock market and feels they will have some friendship/bonding in the days ahead.  Pt plans to see his Counselor soon and will share with her what has transpired since last visit.  . Pt has taken some positive steps toward promoting positive change. Assessed patient's previous treatment, needs, coping skills, current treatment, support system and barriers to care . Patient interviewed and appropriate assessments performed or reviewed:;Suicidal Ideation/Homicidal Ideation: No . Reviewed medications with patient prescribed by PCP and discussed adherence- remains resistant/hesitant to [facility] due to negative side effects experienced in past  . Other interventions include: Depression screen reviewed , Solution-Focused Strategies, Active listening / Reflection utilized , Emotional Supportive Provided, Problem Solving /Task Center , Psychoeducation /Health Education, Motivational Interviewing, Brief CBT , Reviewed mental health medications with patient  and Participation in support group encouraged   . Collaboration with PCP regarding development and update of comprehensive plan of care  as evidenced by provider attestation and co-signature . Inter-disciplinary care team collaboration (see longitudinal plan of care) Patient Goals/Self-Care Activities:   -discuss medication concerns with PCP, Pharmacist and with Psychiatrist -call 911 for any emergency/crisis needs - continue with individual therapy/counseling sessions   - continue to seek ways to engage with friends, neighbors, old co-workers, Social research officer, government - check into the  volunteer opportunities in more depth  - perform a random act of kindness - practice positive self thoughts - continue social outings to [employer] and senior center engaging with others  -continue to seek activities that are of interest    -Follow Up Plan: 12/09/2020 11:30 am      Follow Up Plan: Appointment scheduled for SW follow up with client by phone on: 12/09/2020      [name] MSW, LCSW Licensed Clinical Social Worker [employer] [phone]

## 2020-11-06 NOTE — Patient Instructions (Signed)
Visit Information  Phone number for Pharmacist: [phone]  Goals Addressed            This Visit's Progress   . Manage My Medicine       Timeframe:  Long-Range Goal Priority:  Medium Start Date:  10/15/20                           Expected End Date:     04/17/21                  Follow Up Date 02/10/21   - call for medicine refill 2 or 3 days before it runs out - call if I am sick and can't take my medicine - keep a list of all the medicines I take; vitamins and herbals too  -Stop Escitalopram -Discuss Rybelsus with endocrinologist. Contact pharmacist for patient assistance if needed.   Why is this important?   . These steps will help you keep on track with your medicines.   Notes:       Patient verbalizes understanding of instructions provided today and agrees to view in [place].  Telephone follow up appointment with pharmacy team member scheduled for: 3 months  Charlene Brooke, PharmD, [place], CPP Clinical Pharmacist [facility] [phone]

## 2020-11-14 ENCOUNTER — Other Ambulatory Visit (HOSPITAL_COMMUNITY): Payer: Self-pay

## 2020-11-14 ENCOUNTER — Other Ambulatory Visit: Payer: Self-pay | Admitting: Internal Medicine

## 2020-11-14 DIAGNOSIS — M6289 Other specified disorders of muscle: Secondary | ICD-10-CM | POA: Diagnosis not present

## 2020-11-14 DIAGNOSIS — E041 Nontoxic single thyroid nodule: Secondary | ICD-10-CM | POA: Diagnosis not present

## 2020-11-14 DIAGNOSIS — E1165 Type 2 diabetes mellitus with hyperglycemia: Secondary | ICD-10-CM | POA: Diagnosis not present

## 2020-11-14 DIAGNOSIS — R5383 Other fatigue: Secondary | ICD-10-CM | POA: Diagnosis not present

## 2020-11-14 DIAGNOSIS — E1142 Type 2 diabetes mellitus with diabetic polyneuropathy: Secondary | ICD-10-CM | POA: Diagnosis not present

## 2020-11-14 MED ORDER — HYDROCHLOROTHIAZIDE 25 MG PO TABS
ORAL_TABLET | ORAL | 1 refills | Status: DC
  Filled 2020-11-14: qty 90, 90d supply, fill #0
  Filled 2021-03-09: qty 90, 90d supply, fill #1

## 2020-11-14 MED FILL — Metoprolol Tartrate Tab 25 MG: ORAL | 90 days supply | Qty: 270 | Fill #0 | Status: AC

## 2020-11-19 ENCOUNTER — Other Ambulatory Visit (HOSPITAL_COMMUNITY): Payer: Self-pay

## 2020-11-20 DIAGNOSIS — F411 Generalized anxiety disorder: Secondary | ICD-10-CM | POA: Diagnosis not present

## 2020-11-26 ENCOUNTER — Other Ambulatory Visit (HOSPITAL_COMMUNITY): Payer: Self-pay

## 2020-11-26 DIAGNOSIS — E041 Nontoxic single thyroid nodule: Secondary | ICD-10-CM | POA: Diagnosis not present

## 2020-12-01 ENCOUNTER — Other Ambulatory Visit (HOSPITAL_COMMUNITY): Payer: Self-pay

## 2020-12-01 ENCOUNTER — Other Ambulatory Visit: Payer: Self-pay | Admitting: Internal Medicine

## 2020-12-03 ENCOUNTER — Other Ambulatory Visit (HOSPITAL_COMMUNITY): Payer: Self-pay

## 2020-12-03 MED ORDER — OMEGA-3-ACID ETHYL ESTERS 1 G PO CAPS
ORAL_CAPSULE | ORAL | 1 refills | Status: DC
  Filled 2020-12-03: qty 180, 90d supply, fill #0
  Filled 2021-03-09: qty 180, 90d supply, fill #1

## 2020-12-03 MED ORDER — MONTELUKAST SODIUM 10 MG PO TABS
ORAL_TABLET | ORAL | 1 refills | Status: DC
  Filled 2020-12-03: qty 90, 90d supply, fill #0
  Filled 2021-03-09: qty 90, 90d supply, fill #1

## 2020-12-05 ENCOUNTER — Other Ambulatory Visit (HOSPITAL_COMMUNITY): Payer: Self-pay

## 2020-12-05 MED FILL — Metformin HCl Tab ER 24HR 500 MG: ORAL | 90 days supply | Qty: 270 | Fill #0 | Status: AC

## 2020-12-09 ENCOUNTER — Telehealth: Payer: HMO | Admitting: *Deleted

## 2020-12-09 ENCOUNTER — Telehealth: Payer: HMO

## 2020-12-09 ENCOUNTER — Ambulatory Visit (INDEPENDENT_AMBULATORY_CARE_PROVIDER_SITE_OTHER): Payer: HMO | Admitting: *Deleted

## 2020-12-09 DIAGNOSIS — F332 Major depressive disorder, recurrent severe without psychotic features: Secondary | ICD-10-CM

## 2020-12-09 DIAGNOSIS — F419 Anxiety disorder, unspecified: Secondary | ICD-10-CM

## 2020-12-10 NOTE — Chronic Care Management (AMB) (Signed)
Chronic Care Management    Clinical Social Work Note  12/10/2020 Name: [name] MRN: [id number] DOB: 02/09/1951  [name] is a 70 y.o. year old male who is a primary care patient of Sharlet [place]ina Real Cons, MD. The CCM team was consulted to assist the patient with chronic disease management and/or care coordination needs related to: [employer]  and [place] and Resources.   Engaged with patient by telephone for follow up visit in response to provider referral for social work chronic care management and care coordination services.   Consent to Services:  The patient was given information about Chronic Care Management services, agreed to services, and gave verbal consent prior to initiation of services.  Please see initial visit note for detailed documentation.   Patient agreed to services and consent obtained.   Assessment: Review of patient past medical history, allergies, medications, and health status, including review of relevant consultants reports was performed today as part of a comprehensive evaluation and provision of chronic care management and care coordination services.     SDOH (Social Determinants of Health) assessments and interventions performed:    Advanced Directives Status: Not addressed in this encounter.  CCM Care Plan  Allergies  Allergen Reactions   Testosterone Shortness Of Breath    Medication:Androgel Pt states trouble breathing at night   Codeine Nausea And Vomiting   Gabapentin     "drunk" feeling   Ultram [Tramadol] Nausea And Vomiting   Albuterol Other (See Comments)    Panic attack    Outpatient Encounter Medications as of 12/09/2020  Medication Sig   ALPRAZolam (XANAX) 1 MG tablet TAKE 1/2 TABLET BY MOUTH 4 TIMES A DAY   amoxicillin (AMOXIL) 500 MG capsule TAKE 1 CAPSULE BY MOUTH 3 TIMES DAILY UNTIL GONE   aspirin EC 81 MG tablet Take 81 mg by mouth daily with lunch.   azelastine (ASTELIN) 0.1 % nasal spray  Place 2 sprays into both nostrils 2 (two) times daily. Office visit is due   bisacodyl (DULCOLAX) 10 MG suppository Place 1 suppository (10 mg total) rectally daily as needed for moderate constipation.   calcium carbonate (TUMS EX) 750 MG chewable tablet Chew 1 tablet by mouth daily as needed for heartburn.    Cetirizine HCl (ZYRTEC PO) Take by mouth.   cholecalciferol (VITAMIN D) 1000 UNITS tablet Take 2,000 Units by mouth daily.   COVID-19 mRNA Vac-TriS, Pfizer, SUSP injection Inject into the muscle.   EPINEPHrine 0.3 mg/0.3 mL IJ SOAJ injection INJECT 0.3 MLS INTO THE MUSCLE ONCE AS DIRECTED   fexofenadine (ALLEGRA) 180 MG tablet Take 90 mg by mouth 2 (two) times daily.   fluticasone (FLONASE) 50 MCG/ACT nasal spray PLACE 2 SPRAYS INTO BOTH NOSTRILS DAILY.   hydrochlorothiazide (HYDRODIURIL) 25 MG tablet TAKE 1 TABLET (25 MG TOTAL) BY MOUTH DAILY. ANNUAL APPT DUE IN OCT MUST SEE PROVIDER FOR FUTURE REFILLS   metFORMIN (GLUCOPHAGE-XR) 500 MG 24 hr tablet TAKE 3 TABLETS (1,500 MG TOTAL) BY MOUTH DAILY WITH BREAKFAST.   metoprolol tartrate (LOPRESSOR) 25 MG tablet TAKE 1 TABLET (25 MG TOTAL) BY MOUTH 3 (THREE) TIMES DAILY.   montelukast (SINGULAIR) 10 MG tablet TAKE 1/2 TABLET BY MOUTH 2 TIMES DAILY.   Multiple Vitamin (MULTIVITAMIN WITH MINERALS) TABS Take 1 tablet by mouth daily.   omega-3 acid ethyl esters (LOVAZA) 1 g capsule TAKE 1 CAPSULE (1 G TOTAL) BY MOUTH 2 (TWO) TIMES DAILY. ANNUAL APPT DUE IN OCT MUST SEE PROVIDER FOR FUTURE REFILLS  OVER THE COUNTER MEDICATION Apply 1 application topically daily as needed (for acne). Oxy 10 vanishing cream   polyethylene glycol powder (GLYCOLAX/MIRALAX) powder MIX 17G WITH LIQUID AND DRINK BY MOUTH DAILY.   PREVIDENT 5000 DRY MOUTH 1.1 % GEL dental gel APPLY THIN RIBBON AND BRUSH FOR 2 MINUTES DAILY IN PLACE OF CONVENTIONAL TOOTHPASTE   rosuvastatin (CRESTOR) 10 MG tablet TAKE 1 TABLET BY MOUTH ONCE DAILY   sodium fluoride (FLUORISHIELD) 1.1 % GEL  dental gel APPLY THIN RIBBON AND BRUSH FOR 2 MINUTES DAILY IN PLACE OF CONVENTIONAL TOOTHPASTE   thiamine (VITAMIN B-1) 100 MG tablet Take 100 mg by mouth 2 (two) times daily at 10 AM and 5 PM.    triamcinolone (KENALOG) 0.1 % APPLY ON THE SKIN TWICE A DAY; APPLY TO RIGHT SCALP AREA UNTIL CLEAR   TURMERIC CURCUMIN PO Take by mouth.   vitamin B-12 (CYANOCOBALAMIN) 500 MCG tablet Take 500-1,000 mcg by mouth daily.    No facility-administered encounter medications on file as of 12/09/2020.    Patient Active Problem List   Diagnosis Date Noted   History of aortic dissection 05/28/2020   Genitourinary complaints 04/04/2020   Elevated LFTs 03/20/2020   Benign prostatic hyperplasia without lower urinary tract symptoms 03/20/2020   Skin lesion of scalp 03/20/2020   Pseudodementia 03/05/2020   Cerebral microvascular disease 03/05/2020   Idiopathic small fiber sensory neuropathy 03/05/2020   Severe episode of recurrent major depressive disorder, without psychotic features ([place]) 03/05/2020   Colon cancer screening 10/12/2019   Coronary artery calcification seen on CAT scan 03/17/2018   Ischemic brain damage 08/17/2017   Routine general medical examination at a health care facility 07/18/2015   Thyroid nodule 04/21/2015   AAA (abdominal aortic aneurysm) without rupture ([place]) 04/15/2015   MCI (mild cognitive impairment) 08/26/2014   Benign paroxysmal positional vertigo 08/26/2014   Peripheral neuropathy 03/21/2013   DM2 (diabetes mellitus, type 2) ([place])    Brachial plexopathy    Anxiety    Occlusion and stenosis of carotid artery without mention of cerebral infarction 04/11/2012   AAA (abdominal aortic aneurysm) ([place]) 03/17/2012   Vitamin D deficiency 12/17/2008   Hyperlipidemia associated with type 2 diabetes mellitus ([place]) 12/17/2008   OBSTRUCTIVE SLEEP APNEA 12/17/2008   Migraine 12/17/2008   ALLERGIC RHINITIS 12/17/2008   Asthma 12/17/2008   ERECTILE DYSFUNCTION, ORGANIC 12/17/2008     Conditions to be addressed/monitored: Depression; Mental Health Concerns  and Social Isolation  Care Plan : LCSW Plan of Care  Updates made by Deirdre Peer, LCSW since 12/10/2020 12:00 AM     Problem: Need for patient centered plan of care to promote improved quality of life   Priority: High     Long-Range Goal: Develop self management plan to improve quality of life   Start Date: 07/23/2020  Expected End Date: 02/11/2021  This Visit's Progress: On track  Recent Progress: On track  Priority: High  Note:    Current barriers:   Continued feelings of being alone/isolated "wanting to have meaningful intellectual conversations" Limited social/support Limited self health management plan in place  to address perceived decline in quality of life related to retirement, COVID isolation, etc. Negative thoughts and feelings around self, relationships and isolation. Clinical Goal(s): patient will work with SW to reduce or manage symptoms related to feelings of decrease in quality of life Clinical Interventions:  Pt has taken some positive steps toward promoting positive change.  Pt also shared he has become more focused on "lifecycle things"  and feeling "up and down" with his emotions and morality.  Pt continues to see a Counselor and is encouraged to discuss this with them.  CSW encouraged pt to seek more opportunities, activities, etc to help with his feeling of "Aloneness" and to focus less on the negative and to have more positive mindset including positive self care. Pt did enjoy some family time for fathers day, dinner with a neighbor.  Assessed patient's previous treatment, needs, coping skills, current treatment, support system and barriers to care Patient interviewed and appropriate assessments performed or reviewed:;Suicidal Ideation/Homicidal Ideation: No Reviewed medications with patient prescribed by PCP and discussed adherence- remains resistant/hesitant to [facility] due to negative  side effects experienced in past  Other interventions include: Depression screen reviewed , Solution-Focused Strategies, Active listening / Reflection utilized , Emotional Supportive Provided, Problem Solving /Task Center , Psychoeducation /Health Education, Motivational Interviewing, Brief CBT , Reviewed mental health medications with patient  and Participation in support group encouraged   Collaboration with PCP regarding development and update of comprehensive plan of care as evidenced by provider attestation and co-signature Inter-disciplinary care team collaboration (see longitudinal plan of care) Patient Goals/Self-Care Activities:   -call 911 for any emergency/crisis needs - continue with individual therapy/counseling sessions   - continue to seek ways to engage with friends, neighbors, old co-workers, Social research officer, government - check into the  volunteer opportunities in more depth  - perform a random act of kindness - practice positive self thoughts - continue social outings to [employer] and senior center engaging with others  -continue to seek activities that are of interest    -Follow Up Plan: 01/08/2021 11:30 am      Follow Up Plan: Appointment scheduled for SW follow up with client by phone on: 01/08/21      [name] MSW, [place] Licensed Clinical Social Worker [place] [phone]

## 2020-12-10 NOTE — Patient Instructions (Signed)
Visit Information  PATIENT GOALS:  Goals Addressed             This Visit's Progress    Continue to work with Education officer, museum       Timeframe:  Long-Range Goal Priority:  High Start Date:  07/22/2020                          Expected End Date:     02/11/2021                  Follow Up Date 01/08/2021   -call 58 for any emergency/crisis needs - continue with individual therapy/counseling sessions   - continue to seek ways to engage with friends, neighbors, old co-workers, Social research officer, government - check into the  volunteer opportunities in more depth  - perform a random act of kindness - practice positive self thoughts - continue social outings to [employer] and senior center engaging with others  -continue to seek activities that are of interest    Why is this important?   When you are stressed, down or upset, your body reacts too.  For example, your blood pressure may get higher; you may have a headache or stomachache.  When your emotions get the best of you, your body's ability to fight off cold and flu gets weak.  These steps will help you manage your emotions.     Notes:          Patient verbalizes understanding of instructions provided today and agrees to view in [place].   Telephone follow up appointment with care management team member scheduled for:01/08/21  [name] MSW, [place] Licensed Clinical Social Worker [place] [phone]

## 2020-12-16 DIAGNOSIS — F411 Generalized anxiety disorder: Secondary | ICD-10-CM | POA: Diagnosis not present

## 2020-12-17 ENCOUNTER — Other Ambulatory Visit (HOSPITAL_COMMUNITY): Payer: Self-pay

## 2020-12-17 MED FILL — Sodium Fluoride Gel 1.1% (0.5% F): DENTAL | 30 days supply | Qty: 100 | Fill #0 | Status: AC

## 2020-12-18 ENCOUNTER — Other Ambulatory Visit (HOSPITAL_COMMUNITY): Payer: Self-pay

## 2021-01-05 ENCOUNTER — Other Ambulatory Visit (HOSPITAL_COMMUNITY): Payer: Self-pay

## 2021-01-06 ENCOUNTER — Other Ambulatory Visit (HOSPITAL_COMMUNITY): Payer: Self-pay

## 2021-01-06 MED ORDER — CLOTRIMAZOLE 10 MG MT TROC
OROMUCOSAL | 0 refills | Status: DC
  Filled 2021-01-06: qty 70, 14d supply, fill #0

## 2021-01-07 ENCOUNTER — Other Ambulatory Visit (HOSPITAL_COMMUNITY): Payer: Self-pay

## 2021-01-08 ENCOUNTER — Ambulatory Visit (INDEPENDENT_AMBULATORY_CARE_PROVIDER_SITE_OTHER): Payer: HMO | Admitting: *Deleted

## 2021-01-08 DIAGNOSIS — E1140 Type 2 diabetes mellitus with diabetic neuropathy, unspecified: Secondary | ICD-10-CM

## 2021-01-08 DIAGNOSIS — F32A Depression, unspecified: Secondary | ICD-10-CM | POA: Diagnosis not present

## 2021-01-09 NOTE — Patient Instructions (Signed)
Visit Information  PATIENT GOALS:  Goals Addressed   None     The patient verbalized understanding of instructions, educational materials, and care plan provided today and declined offer to receive copy of patient instructions, educational materials, and care plan.   Telephone follow up appointment with care management team member scheduled for:02/09/21  Shabria Egley MSW, LCSW Licensed Clinical Social Worker [place] [phone]

## 2021-01-09 NOTE — Chronic Care Management (AMB) (Signed)
Chronic Care Management    Clinical Social Work Note  01/09/2021 Name: [name] MRN: [id number] DOB: Dec 15, 1950  [name] is a 70 y.o. year old male who is a primary care patient of Sharlet Salina Real Cons, MD. The CCM team was consulted to assist the patient with chronic disease management and/or care coordination needs related to: [employer]  and [place] [employer].   Engaged with patient by telephone for follow up visit in response to provider referral for social work chronic care management and care coordination services.   Consent to Services:  The patient was given information about Chronic Care Management services, agreed to services, and gave verbal consent prior to initiation of services.  Please see initial visit note for detailed documentation.   Patient agreed to services and consent obtained.   Assessment: Review of patient past medical history, allergies, medications, and health status, including review of relevant consultants reports was performed today as part of a comprehensive evaluation and provision of chronic care management and care coordination services.     SDOH (Social Determinants of Health) assessments and interventions performed:    Advanced Directives Status: Not addressed in this encounter.  CCM Care Plan  Allergies  Allergen Reactions   Testosterone Shortness Of Breath    Medication:Androgel Pt states trouble breathing at night   Codeine Nausea And Vomiting   Gabapentin     "drunk" feeling   Ultram [Tramadol] Nausea And Vomiting   Albuterol Other (See Comments)    Panic attack    Outpatient Encounter Medications as of 01/08/2021  Medication Sig   ALPRAZolam (XANAX) 1 MG tablet TAKE 1/2 TABLET BY MOUTH 4 TIMES A DAY   amoxicillin (AMOXIL) 500 MG capsule TAKE 1 CAPSULE BY MOUTH 3 TIMES DAILY UNTIL GONE   aspirin EC 81 MG tablet Take 81 mg by mouth daily with lunch.   azelastine (ASTELIN) 0.1 % nasal spray  Place 2 sprays into both nostrils 2 (two) times daily. Office visit is due   bisacodyl (DULCOLAX) 10 MG suppository Place 1 suppository (10 mg total) rectally daily as needed for moderate constipation.   calcium carbonate (TUMS EX) 750 MG chewable tablet Chew 1 tablet by mouth daily as needed for heartburn.    Cetirizine HCl (ZYRTEC PO) Take by mouth.   cholecalciferol (VITAMIN D) 1000 UNITS tablet Take 2,000 Units by mouth daily.   clotrimazole (MYCELEX) 10 MG troche take 1 troche (10 mg) dissolved slowly in the mouth  5 times per day, for 14 days   COVID-19 mRNA Vac-TriS, Pfizer, SUSP injection Inject into the muscle.   EPINEPHrine 0.3 mg/0.3 mL IJ SOAJ injection INJECT 0.3 MLS INTO THE MUSCLE ONCE AS DIRECTED   fexofenadine (ALLEGRA) 180 MG tablet Take 90 mg by mouth 2 (two) times daily.   fluticasone (FLONASE) 50 MCG/ACT nasal spray PLACE 2 SPRAYS INTO BOTH NOSTRILS DAILY.   hydrochlorothiazide (HYDRODIURIL) 25 MG tablet TAKE 1 TABLET (25 MG TOTAL) BY MOUTH DAILY. ANNUAL APPT DUE IN OCT MUST SEE PROVIDER FOR FUTURE REFILLS   metFORMIN (GLUCOPHAGE-XR) 500 MG 24 hr tablet TAKE 3 TABLETS (1,500 MG TOTAL) BY MOUTH DAILY WITH BREAKFAST.   metoprolol tartrate (LOPRESSOR) 25 MG tablet TAKE 1 TABLET (25 MG TOTAL) BY MOUTH 3 (THREE) TIMES DAILY.   montelukast (SINGULAIR) 10 MG tablet TAKE 1/2 TABLET BY MOUTH 2 TIMES DAILY.   Multiple Vitamin (MULTIVITAMIN WITH MINERALS) TABS Take 1 tablet by mouth daily.   omega-3 acid ethyl esters (LOVAZA) 1  g capsule TAKE 1 CAPSULE (1 G TOTAL) BY MOUTH 2 (TWO) TIMES DAILY. ANNUAL APPT DUE IN OCT MUST SEE PROVIDER FOR FUTURE REFILLS   OVER THE COUNTER MEDICATION Apply 1 application topically daily as needed (for acne). Oxy 10 vanishing cream   polyethylene glycol powder (GLYCOLAX/MIRALAX) powder MIX 17G WITH LIQUID AND DRINK BY MOUTH DAILY.   PREVIDENT 5000 DRY MOUTH 1.1 % GEL dental gel APPLY THIN RIBBON AND BRUSH FOR 2 MINUTES DAILY IN PLACE OF CONVENTIONAL  TOOTHPASTE   rosuvastatin (CRESTOR) 10 MG tablet TAKE 1 TABLET BY MOUTH ONCE DAILY   sodium fluoride (FLUORISHIELD) 1.1 % GEL dental gel APPLY THIN RIBBON AND BRUSH FOR 2 MINUTES DAILY IN PLACE OF CONVENTIONAL TOOTHPASTE   thiamine (VITAMIN B-1) 100 MG tablet Take 100 mg by mouth 2 (two) times daily at 10 AM and 5 PM.    triamcinolone (KENALOG) 0.1 % APPLY ON THE SKIN TWICE A DAY; APPLY TO RIGHT SCALP AREA UNTIL CLEAR   TURMERIC CURCUMIN PO Take by mouth.   vitamin B-12 (CYANOCOBALAMIN) 500 MCG tablet Take 500-1,000 mcg by mouth daily.    No facility-administered encounter medications on file as of 01/08/2021.    Patient Active Problem List   Diagnosis Date Noted   History of aortic dissection 05/28/2020   Genitourinary complaints 04/04/2020   Elevated LFTs 03/20/2020   Benign prostatic hyperplasia without lower urinary tract symptoms 03/20/2020   Skin lesion of scalp 03/20/2020   Pseudodementia 03/05/2020   Cerebral microvascular disease 03/05/2020   Idiopathic small fiber sensory neuropathy 03/05/2020   Severe episode of recurrent major depressive disorder, without psychotic features ([place]) 03/05/2020   Colon cancer screening 10/12/2019   Coronary artery calcification seen on CAT scan 03/17/2018   Ischemic brain damage 08/17/2017   Routine general medical examination at a health care facility 07/18/2015   Thyroid nodule 04/21/2015   AAA (abdominal aortic aneurysm) without rupture ([place]) 04/15/2015   MCI (mild cognitive impairment) 08/26/2014   Benign paroxysmal positional vertigo 08/26/2014   Peripheral neuropathy 03/21/2013   DM2 (diabetes mellitus, type 2) ([place])    Brachial plexopathy    Anxiety    Occlusion and stenosis of carotid artery without mention of cerebral infarction 04/11/2012   AAA (abdominal aortic aneurysm) ([place]) 03/17/2012   Vitamin D deficiency 12/17/2008   Hyperlipidemia associated with type 2 diabetes mellitus ([place]) 12/17/2008   OBSTRUCTIVE SLEEP APNEA  12/17/2008   Migraine 12/17/2008   ALLERGIC RHINITIS 12/17/2008   Asthma 12/17/2008   ERECTILE DYSFUNCTION, ORGANIC 12/17/2008    Conditions to be addressed/monitored: Depression; Mental Health Concerns   Care Plan : LCSW Plan of Care  Updates made by Deirdre Peer, LCSW since 01/09/2021 12:00 AM     Problem: Need for patient centered plan of care to promote improved quality of life   Priority: High     Long-Range Goal: Develop self management plan to improve quality of life   Start Date: 07/23/2020  Expected End Date: 03/13/2021  This Visit's Progress: On track  Recent Progress: On track  Priority: High  Note:    Current barriers:   Continued feelings of being alone/isolated "wanting to have meaningful intellectual conversations" Limited social/support Limited self health management plan in place  to address perceived decline in quality of life related to retirement, COVID isolation, etc. Negative thoughts and feelings around self, relationships and isolation. Clinical Goal(s): patient will work with SW to reduce or manage symptoms related to feelings of decrease in quality of life Clinical  Interventions:  Pt continues with positive steps toward promoting positive change.  Pt shares he has been more active and social; connecting with neighbors. Pt has read some recent research about Serotonin and has some questions about the benefit and possible side effects.  Pt to discuss further with Provider and CSW will complete PHQ9 depression screening with pt during next visit.  CSW encouraged pt to seek more opportunities, activities, etc to help with his feeling of "Aloneness" and to focus less on the negative and to have more positive mindset including positive self care. Pt did enjoy some family time for fathers day, dinner with a neighbor.  Assessed patient's previous treatment, needs, coping skills, current treatment, support system and barriers to care Patient interviewed and  appropriate assessments performed or reviewed:;Suicidal Ideation/Homicidal Ideation: No Reviewed medications with patient prescribed by PCP and discussed adherence- remains resistant/hesitant to [facility] due to negative side effects experienced in past  Other interventions include: Depression screen reviewed , Solution-Focused Strategies, Active listening / Reflection utilized , Emotional Supportive Provided, Problem Solving /Task Center , Psychoeducation /Health Education, Motivational Interviewing, Brief CBT , Reviewed mental health medications with patient  and Participation in support group encouraged   Collaboration with PCP regarding development and update of comprehensive plan of care as evidenced by provider attestation and co-signature Inter-disciplinary care team collaboration (see longitudinal plan of care) Patient Goals/Self-Care Activities:   -call 911 for any emergency/crisis needs - continue with individual therapy/counseling sessions   - continue to seek ways to engage with friends, neighbors, old co-workers, Social research officer, government - check into the  volunteer opportunities in more depth  - perform a random act of kindness - practice positive self thoughts - continue social outings to [employer] and senior center engaging with others  -continue to seek activities that are of interest    -Follow Up Plan: 02/09/2021 11:30 am      Follow Up Plan: Appointment scheduled for SW follow up with client by phone on: 02/09/21      [name] MSW, LCSW Licensed Clinical Social Worker [place] [phone]

## 2021-01-12 ENCOUNTER — Telehealth: Payer: HMO

## 2021-01-14 ENCOUNTER — Telehealth: Payer: HMO

## 2021-01-19 ENCOUNTER — Other Ambulatory Visit (HOSPITAL_COMMUNITY): Payer: Self-pay

## 2021-01-21 ENCOUNTER — Other Ambulatory Visit (HOSPITAL_COMMUNITY): Payer: Self-pay

## 2021-01-26 DIAGNOSIS — L821 Other seborrheic keratosis: Secondary | ICD-10-CM | POA: Diagnosis not present

## 2021-01-26 DIAGNOSIS — L570 Actinic keratosis: Secondary | ICD-10-CM | POA: Diagnosis not present

## 2021-01-30 DIAGNOSIS — F411 Generalized anxiety disorder: Secondary | ICD-10-CM | POA: Diagnosis not present

## 2021-02-03 ENCOUNTER — Other Ambulatory Visit (HOSPITAL_COMMUNITY): Payer: Self-pay

## 2021-02-03 MED ORDER — SODIUM FLUORIDE 1.1 % DT GEL
DENTAL | 3 refills | Status: DC
  Filled 2021-02-03: qty 100, 30d supply, fill #0
  Filled 2021-09-07: qty 100, 30d supply, fill #1

## 2021-02-05 ENCOUNTER — Other Ambulatory Visit (HOSPITAL_COMMUNITY): Payer: Self-pay

## 2021-02-10 ENCOUNTER — Ambulatory Visit (INDEPENDENT_AMBULATORY_CARE_PROVIDER_SITE_OTHER): Payer: HMO | Admitting: *Deleted

## 2021-02-10 DIAGNOSIS — E1140 Type 2 diabetes mellitus with diabetic neuropathy, unspecified: Secondary | ICD-10-CM

## 2021-02-10 DIAGNOSIS — F419 Anxiety disorder, unspecified: Secondary | ICD-10-CM

## 2021-02-10 DIAGNOSIS — R5382 Chronic fatigue, unspecified: Secondary | ICD-10-CM

## 2021-02-10 NOTE — Chronic Care Management (AMB) (Signed)
Chronic Care Management    Clinical Social Work Note  02/10/2021 Name: [name] MRN: [id number] DOB: [birth year]  [name] is a 70 y.o. year old male who is a primary care patient of Ryan Salina Real Cons, MD. The CCM team was consulted to assist the patient with chronic disease management and/or care coordination needs related to: [employer]  and [place] [employer].   Engaged with patient by telephone for follow up visit in response to provider referral for social work chronic care management and care coordination services.   Consent to Services:  The patient was given information about Chronic Care Management services, agreed to services, and gave verbal consent prior to initiation of services.  Please see initial visit note for detailed documentation.   Patient agreed to services and consent obtained.   Assessment: Review of patient past medical history, allergies, medications, and health status, including review of relevant consultants reports was performed today as part of a comprehensive evaluation and provision of chronic care management and care coordination services.     SDOH (Social Determinants of Health) assessments and interventions performed:    Advanced Directives Status: See Care Plan for related entries.  CCM Care Plan  Allergies  Allergen Reactions   Testosterone Shortness Of Breath    Medication:Androgel Pt states trouble breathing at night   Codeine Nausea And Vomiting   Gabapentin     "drunk" feeling   Ultram [Tramadol] Nausea And Vomiting   Albuterol Other (See Comments)    Panic attack    Outpatient Encounter Medications as of 02/10/2021  Medication Sig   ALPRAZolam (XANAX) 1 MG tablet TAKE 1/2 TABLET BY MOUTH 4 TIMES A DAY   amoxicillin (AMOXIL) 500 MG capsule TAKE 1 CAPSULE BY MOUTH 3 TIMES DAILY UNTIL GONE   aspirin EC 81 MG tablet Take 81 mg by mouth daily with lunch.   azelastine (ASTELIN) 0.1 % nasal  spray Place 2 sprays into both nostrils 2 (two) times daily. Office visit is due   bisacodyl (DULCOLAX) 10 MG suppository Place 1 suppository (10 mg total) rectally daily as needed for moderate constipation.   calcium carbonate (TUMS EX) 750 MG chewable tablet Chew 1 tablet by mouth daily as needed for heartburn.    Cetirizine HCl (ZYRTEC PO) Take by mouth.   cholecalciferol (VITAMIN D) 1000 UNITS tablet Take 2,000 Units by mouth daily.   clotrimazole (MYCELEX) 10 MG troche take 1 troche (10 mg) dissolved slowly in the mouth  5 times per day, for 14 days   COVID-19 mRNA Vac-TriS, Pfizer, SUSP injection Inject into the muscle.   EPINEPHrine 0.3 mg/0.3 mL IJ SOAJ injection INJECT 0.3 MLS INTO THE MUSCLE ONCE AS DIRECTED   fexofenadine (ALLEGRA) 180 MG tablet Take 90 mg by mouth 2 (two) times daily.   fluticasone (FLONASE) 50 MCG/ACT nasal spray PLACE 2 SPRAYS INTO BOTH NOSTRILS DAILY.   hydrochlorothiazide (HYDRODIURIL) 25 MG tablet TAKE 1 TABLET (25 MG TOTAL) BY MOUTH DAILY. ANNUAL APPT DUE IN OCT MUST SEE PROVIDER FOR FUTURE REFILLS   metFORMIN (GLUCOPHAGE-XR) 500 MG 24 hr tablet TAKE 3 TABLETS (1,500 MG TOTAL) BY MOUTH DAILY WITH BREAKFAST.   metoprolol tartrate (LOPRESSOR) 25 MG tablet TAKE 1 TABLET (25 MG TOTAL) BY MOUTH 3 (THREE) TIMES DAILY.   montelukast (SINGULAIR) 10 MG tablet TAKE 1/2 TABLET BY MOUTH 2 TIMES DAILY.   Multiple Vitamin (MULTIVITAMIN WITH MINERALS) TABS Take 1 tablet by mouth daily.   omega-3 acid ethyl esters (LOVAZA)  1 g capsule TAKE 1 CAPSULE (1 G TOTAL) BY MOUTH 2 (TWO) TIMES DAILY. ANNUAL APPT DUE IN OCT MUST SEE PROVIDER FOR FUTURE REFILLS   OVER THE COUNTER MEDICATION Apply 1 application topically daily as needed (for acne). Oxy 10 vanishing cream   polyethylene glycol powder (GLYCOLAX/MIRALAX) powder MIX 17G WITH LIQUID AND DRINK BY MOUTH DAILY.   PREVIDENT 5000 DRY MOUTH 1.1 % GEL dental gel APPLY THIN RIBBON AND BRUSH FOR 2 MINUTES DAILY IN PLACE OF CONVENTIONAL  TOOTHPASTE   rosuvastatin (CRESTOR) 10 MG tablet TAKE 1 TABLET BY MOUTH ONCE DAILY   sodium fluoride (PREVIDENT) 1.1 % GEL dental gel Apply thin ribbon/pea-sized amount to toothbrush. Brush teeth thoroughly, for at least 2 min. Use daily in place of conventional toothpaste.   thiamine (VITAMIN B-1) 100 MG tablet Take 100 mg by mouth 2 (two) times daily at 10 AM and 5 PM.    triamcinolone (KENALOG) 0.1 % APPLY ON THE SKIN TWICE A DAY; APPLY TO RIGHT SCALP AREA UNTIL CLEAR   TURMERIC CURCUMIN PO Take by mouth.   vitamin B-12 (CYANOCOBALAMIN) 500 MCG tablet Take 500-1,000 mcg by mouth daily.    [DISCONTINUED] oxyCODONE (OXY IR/ROXICODONE) 5 MG immediate release tablet Take 1 tablet (5 mg total) by mouth every 4 (four) hours as needed.   No facility-administered encounter medications on file as of 02/10/2021.    Patient Active Problem List   Diagnosis Date Noted   History of aortic dissection 05/28/2020   Genitourinary complaints 04/04/2020   Elevated LFTs 03/20/2020   Benign prostatic hyperplasia without lower urinary tract symptoms 03/20/2020   Skin lesion of scalp 03/20/2020   Pseudodementia 03/05/2020   Cerebral microvascular disease 03/05/2020   Idiopathic small fiber sensory neuropathy 03/05/2020   Severe episode of recurrent major depressive disorder, without psychotic features ([place]) 03/05/2020   Colon cancer screening 10/12/2019   Coronary artery calcification seen on CAT scan 03/17/2018   Ischemic brain damage 08/17/2017   Routine general medical examination at a health care facility 07/18/2015   Thyroid nodule 04/21/2015   AAA (abdominal aortic aneurysm) without rupture ([place]) 04/15/2015   MCI (mild cognitive impairment) 08/26/2014   Benign paroxysmal positional vertigo 08/26/2014   Peripheral neuropathy 03/21/2013   DM2 (diabetes mellitus, type 2) ([place])    Brachial plexopathy    Anxiety    Occlusion and stenosis of carotid artery without mention of cerebral infarction  04/11/2012   AAA (abdominal aortic aneurysm) ([place]) 03/17/2012   Vitamin D deficiency 12/17/2008   Hyperlipidemia associated with type 2 diabetes mellitus ([place]) 12/17/2008   OBSTRUCTIVE SLEEP APNEA 12/17/2008   Migraine 12/17/2008   ALLERGIC RHINITIS 12/17/2008   Asthma 12/17/2008   ERECTILE DYSFUNCTION, ORGANIC 12/17/2008    Conditions to be addressed/monitored:  mental health stress/concerns ; Mental Health Concerns   Care Plan : LCSW Plan of Care  Updates made by Deirdre Peer, LCSW since 02/10/2021 12:00 AM     Problem: Need for patient centered plan of care to promote improved quality of life   Priority: High     Long-Range Goal: Develop self management plan to improve quality of life   Start Date: 07/23/2020  Expected End Date: 03/13/2021  This Visit's Progress: On track  Recent Progress: On track  Priority: High  Note:    Current barriers:   Continued feelings of being alone/isolated "wanting to have meaningful intellectual conversations" Limited social/support Limited self health management plan in place  to address perceived decline in quality of life  related to retirement, COVID isolation, etc. Negative thoughts and feelings around self, relationships and isolation. Clinical Goal(s): patient will work with SW to reduce or manage symptoms related to feelings of decrease in quality of life Clinical Interventions:  Pt continues with positive steps toward promoting positive change.  Pt shares he continues to be more active and social; recently connected with church in his neighborhood that was having a "s'mores social".  Pt also located an online presentation/zoom about loneliness and is trying to "break my routine" and try new things some.  Pt also shared about some anxiety he is facing; around a house pipe leaking in the wall.  He is in the midst of having this remediated and is worried about picking the right replacement for the flooring and having on-going anxiety about  the fact that "this could happen again".  CSW talked pt through some self talk and discussed other techniques that may help with his self-called "OCD anxiety".  CSW also stressed the importance of having discussions with his counselor about these things for other coping skills and techniques to utilize.  Pt plans to be seen by "[employer]" rep in September and denies any current concerns regarding his RX or other. Reminded pt to call CSW and/or 24 hour Nurse Line if need arise.  Assessed patient's previous treatment, needs, coping skills, current treatment, support system and barriers to care Patient interviewed and appropriate assessments performed or reviewed:;Suicidal Ideation/Homicidal Ideation: No Reviewed medications with patient prescribed by PCP and discussed adherence- remains resistant/hesitant to [facility] due to negative side effects experienced in past  Other interventions include: Depression screen reviewed , Solution-Focused Strategies, Active listening / Reflection utilized , Emotional Supportive Provided, Problem Solving /Task Center , Psychoeducation /Health Education, Motivational Interviewing, Brief CBT , Reviewed mental health medications with patient  and Participation in support group encouraged   Collaboration with PCP regarding development and update of comprehensive plan of care as evidenced by provider attestation and co-signature Inter-disciplinary care team collaboration (see longitudinal plan of care) Patient Goals/Self-Care Activities:   -call 911 for any emergency/crisis needs - continue with individual therapy/counseling sessions   - continue to seek ways to engage with friends, neighbors, old co-workers, Social research officer, government - check into the  volunteer opportunities in more depth  - perform a random act of kindness - practice positive self thoughts - continue social outings to [employer] and senior center engaging with others  -continue to seek activities that are of interest     -Follow Up Plan: 03/17/2021 11:30 am      Follow Up Plan: Appointment scheduled for SW follow up with client by phone on: 03/17/21      [name] MSW, [place] Licensed Clinical Social Worker [place] [phone]

## 2021-02-22 ENCOUNTER — Other Ambulatory Visit: Payer: Self-pay | Admitting: Cardiovascular Disease

## 2021-02-23 ENCOUNTER — Other Ambulatory Visit (HOSPITAL_COMMUNITY): Payer: HMO

## 2021-02-24 ENCOUNTER — Other Ambulatory Visit (HOSPITAL_COMMUNITY): Payer: Self-pay

## 2021-02-24 ENCOUNTER — Other Ambulatory Visit: Payer: Self-pay

## 2021-02-24 MED ORDER — METOPROLOL TARTRATE 25 MG PO TABS
25.0000 mg | ORAL_TABLET | Freq: Three times a day (TID) | ORAL | 2 refills | Status: DC
Start: 2021-02-24 — End: 2021-11-23
  Filled 2021-02-24: qty 270, 90d supply, fill #0
  Filled 2021-05-24: qty 270, 90d supply, fill #1
  Filled 2021-08-24: qty 270, 90d supply, fill #2

## 2021-02-25 ENCOUNTER — Other Ambulatory Visit: Payer: Self-pay | Admitting: Internal Medicine

## 2021-02-25 ENCOUNTER — Telehealth: Payer: Self-pay | Admitting: Cardiovascular Disease

## 2021-02-25 ENCOUNTER — Other Ambulatory Visit (HOSPITAL_COMMUNITY): Payer: Self-pay

## 2021-02-25 MED ORDER — AZELASTINE HCL 0.1 % NA SOLN
2.0000 | Freq: Two times a day (BID) | NASAL | 0 refills | Status: DC
  Filled 2021-02-25: qty 30, 25d supply, fill #0

## 2021-02-25 MED FILL — Fluticasone Propionate Nasal Susp 50 MCG/ACT: NASAL | 30 days supply | Qty: 16 | Fill #0 | Status: AC

## 2021-02-25 MED FILL — Fluticasone Propionate Nasal Susp 50 MCG/ACT: NASAL | 30 days supply | Qty: 16 | Fill #0 | Status: CN

## 2021-02-25 NOTE — Telephone Encounter (Signed)
Patient states he is returning 2 calls from the office. He states he has called yesterday and this morning. I did not see any notes.

## 2021-02-26 ENCOUNTER — Other Ambulatory Visit: Payer: Self-pay

## 2021-02-26 ENCOUNTER — Ambulatory Visit (INDEPENDENT_AMBULATORY_CARE_PROVIDER_SITE_OTHER): Payer: HMO | Admitting: Podiatry

## 2021-02-26 DIAGNOSIS — L989 Disorder of the skin and subcutaneous tissue, unspecified: Secondary | ICD-10-CM | POA: Diagnosis not present

## 2021-02-26 DIAGNOSIS — B351 Tinea unguium: Secondary | ICD-10-CM

## 2021-02-26 DIAGNOSIS — E119 Type 2 diabetes mellitus without complications: Secondary | ICD-10-CM

## 2021-02-26 NOTE — Telephone Encounter (Signed)
Patient is returning call.  °

## 2021-02-26 NOTE — Telephone Encounter (Signed)
Spoke with pt and advised it looks like the echo scheduler was reaching out but she has already scheduled him.  Reviewed echo appt and f/u appt with pt and he had the same information as well.  Pt appreciative for call.

## 2021-02-26 NOTE — Telephone Encounter (Signed)
Left message for patient to call back  

## 2021-03-03 DIAGNOSIS — F411 Generalized anxiety disorder: Secondary | ICD-10-CM | POA: Diagnosis not present

## 2021-03-03 NOTE — Progress Notes (Signed)
Subjective: 70 year old male presents the office today for follow-up evaluation of fungus on his left foot as well as a callus on his right foot.  He states in the left fifth toe the nail eventually turned dark in color and it fell off and a new nail come back and clear.  States the callus in the submetatarsal area has been doing better.  Not having any significant pain.  No open sores he reports.   Last A1c that I can see is 7.5 on 03/20/2020  Objective: AAO x3, NAD DP/PT pulses palpable bilaterally, CRT less than 3 seconds Over the nails or pus improved.  The left fifth toenail slightly dystrophic but appears to be more clear in color.  There is no hyperpigmentation.  There is no pain.  No edema, erythema or signs of infection.  Slight hyperkeratotic lesion submetatarsal 2 without any underlying ulceration drainage or signs of infection.  No open lesions.  No other areas of discomfort identified today.  MMT 5/5. No pain with calf compression, swelling, warmth, erythema  Assessment: Onychomycosis, hyperkeratotic lesion  Plan: -All treatment options discussed with the patient including all alternatives, risks, complications.  -At the present lightly debrided the nail without any complications or bleeding to the left fifth toenail.  Overall appears much better.  Elizebeth Koller out the course of antifungal medication to make sure that the fungus has resolved. -Debrided hyperkeratotic lesion any complications or bleeding.  Continue moisturizer and offloading. -Daily foot inspection  Return if symptoms worsen or fail to improve.  Trula Slade DPM

## 2021-03-09 ENCOUNTER — Other Ambulatory Visit (HOSPITAL_COMMUNITY): Payer: Self-pay

## 2021-03-09 MED FILL — Metformin HCl Tab ER 24HR 500 MG: ORAL | 90 days supply | Qty: 270 | Fill #1 | Status: AC

## 2021-03-10 ENCOUNTER — Other Ambulatory Visit: Payer: Self-pay

## 2021-03-10 ENCOUNTER — Other Ambulatory Visit (HOSPITAL_COMMUNITY): Payer: Self-pay

## 2021-03-10 ENCOUNTER — Ambulatory Visit (HOSPITAL_COMMUNITY): Payer: HMO | Attending: Internal Medicine

## 2021-03-10 DIAGNOSIS — I351 Nonrheumatic aortic (valve) insufficiency: Secondary | ICD-10-CM | POA: Insufficient documentation

## 2021-03-10 LAB — ECHOCARDIOGRAM COMPLETE
AR max vel: 3.65 cm2
AV Area VTI: 3.72 cm2
AV Area mean vel: 3.47 cm2
AV Mean grad: 12 mmHg
AV Peak grad: 21.3 mmHg
Ao pk vel: 2.31 m/s
Area-P 1/2: 3.77 cm2
P 1/2 time: 403 msec
S' Lateral: 3.4 cm

## 2021-03-11 ENCOUNTER — Encounter: Payer: Self-pay | Admitting: Adult Health

## 2021-03-11 ENCOUNTER — Ambulatory Visit: Payer: HMO | Admitting: Adult Health

## 2021-03-11 VITALS — BP 123/64 | HR 62 | Ht 76.0 in | Wt 225.0 lb

## 2021-03-11 DIAGNOSIS — R4189 Other symptoms and signs involving cognitive functions and awareness: Secondary | ICD-10-CM

## 2021-03-11 DIAGNOSIS — G608 Other hereditary and idiopathic neuropathies: Secondary | ICD-10-CM | POA: Diagnosis not present

## 2021-03-11 DIAGNOSIS — G43009 Migraine without aura, not intractable, without status migrainosus: Secondary | ICD-10-CM

## 2021-03-11 NOTE — Progress Notes (Addendum)
PATIENT: [name] DOB: 21-Jul-1950  REASON FOR VISIT: follow up HISTORY FROM: patient PRIMARY NEUROLOGIST:   HISTORY OF PRESENT ILLNESS: Today 03/11/21 [name] is a 70 y.o. male with a history of pseudodementia, idiopathic neuropathy. He is here today for a follow up. Patient endorses 2 episodes of global amnesia during activities such as driving. One was 25 years ago and he feels it may have been more of a distraction vs. Amensia. MOCA today was 28/30. Patient previously had an intense migraine resulting in an inability to speak, think clearly, and was unable to use his electronic devices. During the peak of this event he experienced left hand numbness. Following this event he reached out to his PCP and had an MRI completed in march. The MRI demonstrated no new infarcts, hemmorhage, or mass. He endorses that his migraines are inconsistent and has noticed that mental, emotional, or physical stress are triggers. He reports constant numbness in his feet, no significant changes at this time.  He has purchased capsaicin cream but has not used it yet.  He stated that he has tried SSRI's in the past for depression and anxiety and they have been unsuccessful and he experiences undesired side effects. He defers antidepressant medications at this time.   HISTORY Copied from Dr. Brett Fairy 03/05/20 [name] has followed here for peripheral nerve injuries as well as for a complex form of sleep apnea that was partially obstructive and partially central, and he could not use CPAP. All this culminated after open heart surgery. He is meanwhile 70 years of age and will turn 67 this November. He worries about dementia ever since heart surgery- " pump brain" He has difficulties with computer access, remembering password, user names, etc. 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 Forgot his daughter's birthday, has been misspelling words that didn't give him trouble before.  He cannot  recognize faces- but recalls names (Anosognosia) !  He recently mistook his cell phone for the TV control. He feels fatigued, not refreshed or restored.  Dr. Asa Lente prescribed Xanax and this has calmed him, helps with sleep. But he is worried about his cognitive function. CPAP failed him, chin strap very uncomfortable -he wants to learn about dental or ENT treatments.  His OSA is resolved , his last sleep test could not detect any OSA - 10-28-2017  , sill some Anxiety, COPD, Hypertension, and Migraine Headache. He has been reporting muscle cramping, morning stiffness- " I feel like hell" - even more fatigue, cognitive decline, he became very lonely over the time of the pandemic, but he also has recently become a grandfather again and his family life is happy.  He does endorse a high score on the geriatric depression score 11 out of 15 points of clinical depression.  He states that his neuropathy has progressed and his feet are profoundly numb and affect his gait stability.  Also, he reports inability to recall password, to reboot and fix his computer, has again lifted the remote to answer the phone- he is concerned.    I believe he has pseudodementia. He is too worried and anxious to partake in activities that would give him pleasure. He is indecisive, and has less attention to detail, is hyper- focussed on what could go wrong.   REVIEW OF SYSTEMS: Out of a complete 14 system review of symptoms, the patient complains only of the following symptoms, and all other reviewed systems are negative.  MOCA 28/30  ALLERGIES: Allergies  Allergen Reactions   Testosterone Shortness Of Breath    Medication:Androgel Pt states trouble breathing at night   Codeine Nausea And Vomiting   Gabapentin     "drunk" feeling   Ultram [Tramadol] Nausea And Vomiting   Albuterol Other (See Comments)    Panic attack    HOME MEDICATIONS: Outpatient Medications Prior to Visit  Medication Sig Dispense Refill    ALPRAZolam (XANAX) 1 MG tablet TAKE 1/2 TABLET BY MOUTH 4 TIMES A DAY 60 tablet 5   aspirin EC 81 MG tablet Take 81 mg by mouth daily with lunch.     azelastine (ASTELIN) 0.1 % nasal spray Place 2 sprays into both nostrils 2 (two) times daily. Office visit is due 30 mL 0   azelastine (ASTELIN) 0.1 % nasal spray Place 2 sprays into both nostrils 2 (two) times daily. 30 mL 0   bisacodyl (DULCOLAX) 10 MG suppository Place 1 suppository (10 mg total) rectally daily as needed for moderate constipation. 12 suppository 0   calcium carbonate (TUMS EX) 750 MG chewable tablet Chew 1 tablet by mouth daily as needed for heartburn.      Cetirizine HCl (ZYRTEC PO) Take by mouth.     cholecalciferol (VITAMIN D) 1000 UNITS tablet Take 2,000 Units by mouth daily.     COVID-19 mRNA Vac-TriS, Pfizer, SUSP injection Inject into the muscle. 0.3 mL 0   EPINEPHrine 0.3 mg/0.3 mL IJ SOAJ injection INJECT 0.3 MLS INTO THE MUSCLE ONCE AS DIRECTED 2 each 3   fexofenadine (ALLEGRA) 180 MG tablet Take 90 mg by mouth 2 (two) times daily.     fluticasone (FLONASE) 50 MCG/ACT nasal spray PLACE 2 SPRAYS INTO BOTH NOSTRILS DAILY. 16 g 2   hydrochlorothiazide (HYDRODIURIL) 25 MG tablet TAKE 1 TABLET (25 MG TOTAL) BY MOUTH DAILY. ANNUAL APPT DUE IN OCT MUST SEE PROVIDER FOR FUTURE REFILLS 90 tablet 1   metFORMIN (GLUCOPHAGE-XR) 500 MG 24 hr tablet TAKE 3 TABLETS (1,500 MG TOTAL) BY MOUTH DAILY WITH BREAKFAST. 270 tablet 3   metoprolol tartrate (LOPRESSOR) 25 MG tablet Take 1 tablet (25 mg total) by mouth 3 (three) times daily. 270 tablet 2   montelukast (SINGULAIR) 10 MG tablet TAKE 1/2 TABLET BY MOUTH 2 TIMES DAILY. 90 tablet 1   Multiple Vitamin (MULTIVITAMIN WITH MINERALS) TABS Take 1 tablet by mouth daily.     omega-3 acid ethyl esters (LOVAZA) 1 g capsule TAKE 1 CAPSULE (1 G TOTAL) BY MOUTH 2 (TWO) TIMES DAILY. ANNUAL APPT DUE IN OCT MUST SEE PROVIDER FOR FUTURE REFILLS 180 capsule 1   OVER THE COUNTER MEDICATION Apply 1  application topically daily as needed (for acne). Oxy 10 vanishing cream     polyethylene glycol powder (GLYCOLAX/MIRALAX) powder MIX 17G WITH LIQUID AND DRINK BY MOUTH DAILY. 527 g 5   PREVIDENT 5000 DRY MOUTH 1.1 % GEL dental gel APPLY THIN RIBBON AND BRUSH FOR 2 MINUTES DAILY IN PLACE OF CONVENTIONAL TOOTHPASTE  99   rosuvastatin (CRESTOR) 10 MG tablet TAKE 1 TABLET BY MOUTH ONCE DAILY 90 tablet 1   sodium fluoride (PREVIDENT) 1.1 % GEL dental gel Apply thin ribbon/pea-sized amount to toothbrush. Brush teeth thoroughly, for at least 2 min. Use daily in place of conventional toothpaste. 100 mL 3   thiamine (VITAMIN B-1) 100 MG tablet Take 100 mg by mouth 2 (two) times daily at 10 AM and 5 PM.      TURMERIC CURCUMIN PO Take by mouth.  vitamin B-12 (CYANOCOBALAMIN) 500 MCG tablet Take 500-1,000 mcg by mouth daily.      amoxicillin (AMOXIL) 500 MG capsule TAKE 1 CAPSULE BY MOUTH 3 TIMES DAILY UNTIL GONE (Patient not taking: Reported on 03/11/2021) 30 capsule 1   clotrimazole (MYCELEX) 10 MG troche take 1 troche (10 mg) dissolved slowly in the mouth  5 times per day, for 14 days 70 Troche 0   triamcinolone (KENALOG) 0.1 % APPLY ON THE SKIN TWICE A DAY; APPLY TO RIGHT SCALP AREA UNTIL CLEAR (Patient not taking: Reported on 03/11/2021) 45 g 1   No facility-administered medications prior to visit.    PAST MEDICAL HISTORY: Past Medical History:  Diagnosis Date   AAA (abdominal aortic aneurysm) (HCC)    ALLERGIC RHINITIS    Anemia    post op, treated /w Fe   Anxiety    Arthritis    OA, hands, - thumbs- specifically   Brachial plexopathy    double crush injury with median nerve damage on the right dominant hand.    COPD (chronic obstructive pulmonary disease) ([place])    also states he has been told that he has slight emphysema    Dissecting aortic aneurysm, thoracic ([place]) 02/20/2012   s/p emergent surgical repair   DM2 (diabetes mellitus, type 2) ([place])    DVT (deep venous thrombosis) (HCC)     coumadin- x3 months , post op   ERECTILE DYSFUNCTION, ORGANIC    GERD (gastroesophageal reflux disease)    related to diet & anxiety    HYPERLIPIDEMIA    Hypertension    pt. followwed by Dr. Johnsie Cancel   MIGRAINE HEADACHE    induced from physical , emotional stress , but sometimes occur randomly, also experiences BPPV   OBSTRUCTIVE SLEEP APNEA 02/2008 sleep study   moderate & central- not able to tolerate CPAP    Thyroid nodule 04/21/2015   2.3 cm on right, incidental finding on 03/2015 CT chest - f/u US ordered   Unspecified vitamin D deficiency     PAST SURGICAL HISTORY: Past Surgical History:  Procedure Laterality Date   ABDOMINAL AORTIC ANEURYSM REPAIR N/A 11/01/2012   Procedure: ANEURYSM ABDOMINAL AORTIC REPAIR;  Surgeon: Mal Misty, MD;  Location: [facility] OR;  Service: Vascular;  Laterality: N/A;  Resection and Grafting of Abdominal Aortic Aneurysm,(AORTA BI ILIAC)   Nasal fx without repair     THORACIC AORTIC ANEURYSM REPAIR  02/20/2012   Procedure: THORACIC ASCENDING ANEURYSM REPAIR (AAA);  Surgeon: Ivin Poot, MD;  Location: [facility];  Service: Open Heart Surgery;  Laterality: N/A;    FAMILY HISTORY: Family History  Problem Relation Age of Onset   Diabetes Mother    Stroke Mother    Dementia Mother    Deep vein thrombosis Father        PE   Varicose Veins Father    Heart disease Maternal Grandmother    Heart disease Maternal Uncle    Heart disease Paternal Uncle    Heart disease Cousin    Lung cancer Cousin        2 cousins    SOCIAL HISTORY: Social History   Socioeconomic History   Marital status: Divorced    Spouse name: Not on file   Number of children: 2   Years of education: acad. deg.   Highest education level: Not on file  Occupational History   Occupation: internal med resident    Employer: [employer]  Tobacco Use   Smoking status: Former  Packs/day: 0.10    Types: Cigarettes    Quit date: 02/18/2012    Years since quitting: 9.0   Smokeless  tobacco: Never   Tobacco comments:    10 cigs/day, quit 02/2012 after TAA emergent repair  Vaping Use   Vaping Use: Never used  Substance and Sexual Activity   Alcohol use: Yes    Alcohol/week: 0.0 standard drinks    Comment: shot of Tequila every day    Drug use: No   Sexual activity: Not on file  Other Topics Concern   Not on file  Social History Narrative   Patient is single and lives at home alone.    Arboriculturist.   Patient works at [employer] full time.   Right handed.   Caffeine one cup of coffee daily.    Social Determinants of Health   Financial Resource Strain: Low Risk    Difficulty of Paying Living Expenses: Not hard at all  Food Insecurity: Not on file  Transportation Needs: Not on file  Physical Activity: Not on file  Stress: Not on file  Social Connections: Not on file  Intimate Partner Violence: Not on file      PHYSICAL EXAM  Vitals:   03/11/21 1426  BP: 123/64  Pulse: 62  Weight: 225 lb (102.1 kg)  Height: 6\' 4"  (1.93 m)   Body mass index is 27.39 kg/m.  Montreal Cognitive Assessment  03/11/2021 03/05/2020 08/17/2017 08/11/2016 02/10/2016  Visuospatial/ Executive (0/5) 4 5 4 5 5   Naming (0/3) 3 3 3 3 3   Attention: Read list of digits (0/2) 2 2 2 2 2   Attention: Read list of letters (0/1) 1 1 0 1 1  Attention: Serial 7 subtraction starting at 100 (0/3) 3 3 2 2 2   Language: Repeat phrase (0/2) 2 1 1 2 1   Language : Fluency (0/1) 1 1 1 1 1   Abstraction (0/2) 2 2 2 2 2   Delayed Recall (0/5) 4 4 3 3 2   Orientation (0/6) 6 6 5 6 6   Total 28 28 23 27 25   Adjusted Score (based on education) - - - 27 25    Generalized: Well developed, in no acute distress   Neurological examination  Mentation: Alert oriented to time, place, history taking. Follows all commands speech and language fluent Cranial nerve II-XII: Pupils were equal round reactive to light. Extraocular movements were full, visual field were full on confrontational test. Facial  sensation and strength were normal. Uvula tongue midline. Head turning and shoulder shrug  were normal and symmetric. Motor: The motor testing reveals 5 over 5 strength of all 4 extremities. Good symmetric motor tone is noted throughout.  Sensory: Sensory testing is intact to soft touch on all 4 extremities. No evidence of extinction is noted.  Coordination: Cerebellar testing reveals good finger-nose-finger and heel-to-shin bilaterally.  Gait and station: Gait is normal. Tandem gait is normal. Romberg is negative. No drift is seen.  Reflexes: Deep tendon reflexes are symmetric and normal bilaterally.   DIAGNOSTIC DATA (LABS, IMAGING, TESTING) - I reviewed patient records, labs, notes, testing and imaging myself where available.  Lab Results  Component Value Date   WBC 7.1 07/22/2020   HGB 15.7 07/22/2020   HCT 45.8 07/22/2020   MCV 93.1 07/22/2020   PLT 159.0 07/22/2020      Component Value Date/Time   NA 138 07/22/2020 1622   K 4.0 07/22/2020 1622   CL 100 07/22/2020 1622   CO2 30 07/22/2020 1622  GLUCOSE 175 (H) 07/22/2020 1622   BUN 22 07/22/2020 1622   CREATININE 1.04 07/22/2020 1622   CREATININE 0.98 04/03/2015 0957   CALCIUM 9.5 07/22/2020 1622   PROT 7.4 07/22/2020 1622   ALBUMIN 4.4 07/22/2020 1622   AST 39 (H) 07/22/2020 1622   ALT 49 07/22/2020 1622   ALKPHOS 29 (L) 07/22/2020 1622   BILITOT 1.3 (H) 07/22/2020 1622   GFRNONAA >60 06/21/2018 1143   GFRNONAA >89 06/09/2012 1515   GFRAA >60 06/21/2018 1143   GFRAA >89 06/09/2012 1515   Lab Results  Component Value Date   CHOL 135 03/20/2020   HDL 42.60 03/20/2020   LDLCALC 68 03/20/2020   LDLDIRECT 114 (H) 03/21/2010   TRIG 124.0 03/20/2020   CHOLHDL 3 03/20/2020   Lab Results  Component Value Date   HGBA1C 7.5 (H) 03/20/2020   Lab Results  Component Value Date   VITAMINB12 1,292 (H) 03/20/2020   Lab Results  Component Value Date   TSH 1.06 07/22/2020      ASSESSMENT AND PLAN 70 y.o. year old  male  has a past medical history of AAA (abdominal aortic aneurysm) ([place]), ALLERGIC RHINITIS, Anemia, Anxiety, Arthritis, Brachial plexopathy, COPD (chronic obstructive pulmonary disease) ([place]), Dissecting aortic aneurysm, thoracic ([place]) (02/20/2012), DM2 (diabetes mellitus, type 2) ([place]), DVT (deep venous thrombosis) ([place]), ERECTILE DYSFUNCTION, ORGANIC, GERD (gastroesophageal reflux disease), HYPERLIPIDEMIA, Hypertension, MIGRAINE HEADACHE, OBSTRUCTIVE SLEEP APNEA (02/2008 sleep study), Thyroid nodule (04/21/2015), and Unspecified vitamin D deficiency. here with   Pseudodementia -Patients memory is stable and he defers Neuropsych at this time - [place] 28/30  Neuropathy - Stable, continue to monitor for worsening symptoms.   To to use.  Capsaicin OTC cream, he defers any new medications at this time.  Migraine - Continue to monitor migraine episodes, if they become more frequent we will discuss starting preventative medication.  -Educated patient that if the migraines become worrisome, seek immediate medical attention.    -Follow up in 1 year with Dr. Mechele Claude, MSN, NP-C 03/11/2021, 2:36 PM [facility] [address], [place], [place] [address] [phone]

## 2021-03-17 ENCOUNTER — Telehealth: Payer: HMO

## 2021-03-24 ENCOUNTER — Other Ambulatory Visit (HOSPITAL_COMMUNITY): Payer: Self-pay

## 2021-03-27 ENCOUNTER — Other Ambulatory Visit (HOSPITAL_COMMUNITY): Payer: Self-pay

## 2021-03-27 MED ORDER — INFLUENZA VAC A&B SA ADJ QUAD 0.5 ML IM PRSY
0.5000 mL | PREFILLED_SYRINGE | INTRAMUSCULAR | 0 refills | Status: DC
  Filled 2021-03-27: qty 0.5, 1d supply, fill #0

## 2021-04-02 ENCOUNTER — Ambulatory Visit (INDEPENDENT_AMBULATORY_CARE_PROVIDER_SITE_OTHER): Payer: HMO | Admitting: *Deleted

## 2021-04-02 DIAGNOSIS — F419 Anxiety disorder, unspecified: Secondary | ICD-10-CM

## 2021-04-02 DIAGNOSIS — R5382 Chronic fatigue, unspecified: Secondary | ICD-10-CM

## 2021-04-02 DIAGNOSIS — E1140 Type 2 diabetes mellitus with diabetic neuropathy, unspecified: Secondary | ICD-10-CM

## 2021-04-02 NOTE — Chronic Care Management (AMB) (Signed)
Chronic Care Management    Clinical Social Work Note  04/02/2021 Name: [name] MRN: [id number] DOB: 18-May-1951  [name] is a 70 y.o. year old male who is a primary care patient of Sharlet Salina Real Cons, MD. The CCM team was consulted to assist the patient with chronic disease management and/or care coordination needs related to: [employer] , Mental Health Counseling and Resources, and socialization/isolation .   Engaged with patient by telephone for follow up visit in response to provider referral for social work chronic care management and care coordination services.   Consent to Services:  The patient was given information about Chronic Care Management services, agreed to services, and gave verbal consent prior to initiation of services.  Please see initial visit note for detailed documentation.   Patient agreed to services and consent obtained.   Assessment: Review of patient past medical history, allergies, medications, and health status, including review of relevant consultants reports was performed today as part of a comprehensive evaluation and provision of chronic care management and care coordination services.     SDOH (Social Determinants of Health) assessments and interventions performed:    Advanced Directives Status: Not addressed in this encounter.  CCM Care Plan  Allergies  Allergen Reactions   Testosterone Shortness Of Breath    Medication:Androgel Pt states trouble breathing at night   Codeine Nausea And Vomiting   Gabapentin     "drunk" feeling   Ultram [Tramadol] Nausea And Vomiting   Albuterol Other (See Comments)    Panic attack    Outpatient Encounter Medications as of 04/02/2021  Medication Sig   ALPRAZolam (XANAX) 1 MG tablet TAKE 1/2 TABLET BY MOUTH 4 TIMES A DAY   amoxicillin (AMOXIL) 500 MG capsule TAKE 1 CAPSULE BY MOUTH 3 TIMES DAILY UNTIL GONE (Patient not taking: Reported on 03/11/2021)   aspirin EC 81 MG tablet Take  81 mg by mouth daily with lunch.   azelastine (ASTELIN) 0.1 % nasal spray Place 2 sprays into both nostrils 2 (two) times daily. Office visit is due   azelastine (ASTELIN) 0.1 % nasal spray Place 2 sprays into both nostrils 2 (two) times daily.   bisacodyl (DULCOLAX) 10 MG suppository Place 1 suppository (10 mg total) rectally daily as needed for moderate constipation.   calcium carbonate (TUMS EX) 750 MG chewable tablet Chew 1 tablet by mouth daily as needed for heartburn.    Cetirizine HCl (ZYRTEC PO) Take by mouth.   cholecalciferol (VITAMIN D) 1000 UNITS tablet Take 2,000 Units by mouth daily.   COVID-19 mRNA Vac-TriS, Pfizer, SUSP injection Inject into the muscle.   EPINEPHrine 0.3 mg/0.3 mL IJ SOAJ injection INJECT 0.3 MLS INTO THE MUSCLE ONCE AS DIRECTED   fexofenadine (ALLEGRA) 180 MG tablet Take 90 mg by mouth 2 (two) times daily.   fluticasone (FLONASE) 50 MCG/ACT nasal spray PLACE 2 SPRAYS INTO BOTH NOSTRILS DAILY.   hydrochlorothiazide (HYDRODIURIL) 25 MG tablet TAKE 1 TABLET (25 MG TOTAL) BY MOUTH DAILY. ANNUAL APPT DUE IN OCT MUST SEE PROVIDER FOR FUTURE REFILLS   influenza vaccine adjuvanted (FLUAD) 0.5 ML injection Inject 0.5 mLs into the muscle.   metFORMIN (GLUCOPHAGE-XR) 500 MG 24 hr tablet TAKE 3 TABLETS (1,500 MG TOTAL) BY MOUTH DAILY WITH BREAKFAST.   metoprolol tartrate (LOPRESSOR) 25 MG tablet Take 1 tablet (25 mg total) by mouth 3 (three) times daily.   montelukast (SINGULAIR) 10 MG tablet TAKE 1/2 TABLET BY MOUTH 2 TIMES DAILY.   Multiple Vitamin (MULTIVITAMIN WITH  MINERALS) TABS Take 1 tablet by mouth daily.   omega-3 acid ethyl esters (LOVAZA) 1 g capsule TAKE 1 CAPSULE (1 G TOTAL) BY MOUTH 2 (TWO) TIMES DAILY. ANNUAL APPT DUE IN OCT MUST SEE PROVIDER FOR FUTURE REFILLS   OVER THE COUNTER MEDICATION Apply 1 application topically daily as needed (for acne). Oxy 10 vanishing cream   polyethylene glycol powder (GLYCOLAX/MIRALAX) powder MIX 17G WITH LIQUID AND DRINK BY  MOUTH DAILY.   PREVIDENT 5000 DRY MOUTH 1.1 % GEL dental gel APPLY THIN RIBBON AND BRUSH FOR 2 MINUTES DAILY IN PLACE OF CONVENTIONAL TOOTHPASTE   rosuvastatin (CRESTOR) 10 MG tablet TAKE 1 TABLET BY MOUTH ONCE DAILY   sodium fluoride (PREVIDENT) 1.1 % GEL dental gel Apply thin ribbon/pea-sized amount to toothbrush. Brush teeth thoroughly, for at least 2 min. Use daily in place of conventional toothpaste.   thiamine (VITAMIN B-1) 100 MG tablet Take 100 mg by mouth 2 (two) times daily at 10 AM and 5 PM.    TURMERIC CURCUMIN PO Take by mouth.   vitamin B-12 (CYANOCOBALAMIN) 500 MCG tablet Take 500-1,000 mcg by mouth daily.    [DISCONTINUED] oxyCODONE (OXY IR/ROXICODONE) 5 MG immediate release tablet Take 1 tablet (5 mg total) by mouth every 4 (four) hours as needed.   No facility-administered encounter medications on file as of 04/02/2021.    Patient Active Problem List   Diagnosis Date Noted   History of aortic dissection 05/28/2020   Genitourinary complaints 04/04/2020   Elevated LFTs 03/20/2020   Benign prostatic hyperplasia without lower urinary tract symptoms 03/20/2020   Skin lesion of scalp 03/20/2020   Pseudodementia 03/05/2020   Cerebral microvascular disease 03/05/2020   Idiopathic small fiber sensory neuropathy 03/05/2020   Severe episode of recurrent major depressive disorder, without psychotic features ([place]) 03/05/2020   Colon cancer screening 10/12/2019   Coronary artery calcification seen on CAT scan 03/17/2018   Ischemic brain damage 08/17/2017   Routine general medical examination at a health care facility 07/18/2015   Thyroid nodule 04/21/2015   AAA (abdominal aortic aneurysm) without rupture ([place]) 04/15/2015   MCI (mild cognitive impairment) 08/26/2014   Benign paroxysmal positional vertigo 08/26/2014   Peripheral neuropathy 03/21/2013   DM2 (diabetes mellitus, type 2) ([place])    Brachial plexopathy    Anxiety    Occlusion and stenosis of carotid artery without  mention of cerebral infarction 04/11/2012   AAA (abdominal aortic aneurysm) 03/17/2012   Vitamin D deficiency 12/17/2008   Hyperlipidemia associated with type 2 diabetes mellitus ([place]) 12/17/2008   OBSTRUCTIVE SLEEP APNEA 12/17/2008   Migraine 12/17/2008   ALLERGIC RHINITIS 12/17/2008   Asthma 12/17/2008   ERECTILE DYSFUNCTION, ORGANIC 12/17/2008    Conditions to be addressed/monitored: Anxiety and Depression; Limited social support, Mental Health Concerns , and Social Isolation  Care Plan : LCSW Plan of Care  Updates made by Deirdre Peer, LCSW since 04/02/2021 12:00 AM     Problem: Need for patient centered plan of care to promote improved quality of life   Priority: High     Long-Range Goal: Develop self management plan to improve quality of life   Start Date: 07/23/2020  Expected End Date: 06/12/2021  This Visit's Progress: On track  Recent Progress: On track  Priority: High  Note:    Current barriers:   Continued feelings of being alone/isolated "wanting to have meaningful intellectual conversations" Limited social/support Limited self health management plan in place  to address perceived decline in quality of life related to  retirement, COVID isolation, etc. Negative thoughts and feelings around self, relationships and isolation. Clinical Goal(s): patient will work with SW to reduce or manage symptoms related to feelings of decrease in quality of life Clinical Interventions:  Pt continues with positive steps toward promoting positive change; however, does admit to falling out of his routine at the [facility] in the midst of dealing with some unexpected home repairs after a water issue occurred.  He shared with CSW about some recent anxiety he has been dealing with; traveling, fear of his car breaking down/plane crashing, etc. CSW validated pt's concerns and fears and promoted that he attempt to distract himself from the negative thoughts/fears especially with things out  of his control. He was able to go with family to a pre-wedding ceremony for his daughter who plans to get married in March and is planning a trip with his brother to [place] in November. CSW encouraged pt to seek more social outings/activities and to reach out of needs arise prior to follow up call Assessed patient's previous treatment, needs, coping skills, current treatment, support system and barriers to care Patient interviewed and appropriate assessments performed or reviewed:;Suicidal Ideation/Homicidal Ideation: No Reviewed medications with patient prescribed by PCP and discussed adherence- remains resistant/hesitant to SSRI's due to negative side effects experienced in past  Other interventions include: Depression screen reviewed , Solution-Focused Strategies, Active listening / Reflection utilized , Emotional Supportive Provided, Problem Solving /Task Center , Psychoeducation /Health Education, Motivational Interviewing, Brief CBT , Reviewed mental health medications with patient  and Participation in support group encouraged   Collaboration with PCP regarding development and update of comprehensive plan of care as evidenced by provider attestation and co-signature Inter-disciplinary care team collaboration (see longitudinal plan of care) Patient Goals/Self-Care Activities:   -call 911 for any emergency/crisis needs - continue with individual therapy/counseling sessions   - continue to seek ways to engage with friends, neighbors, old co-workers, Social research officer, government - check into the  volunteer opportunities in more depth  - perform a random act of kindness - practice positive self thoughts - continue social outings to [employer] and senior center engaging with others  -continue to seek activities that are of interest    -Follow Up Plan: 05/06/2021 11:30 am      Follow Up Plan: Appointment scheduled for SW follow up with client by phone on: 05/06/21      [name] MSW, LCSW Licensed Clinical  Social Worker [place] [phone]   /

## 2021-04-02 NOTE — Patient Instructions (Signed)
Visit Information  PATIENT GOALS:  Goals Addressed   None     The patient verbalized understanding of instructions, educational materials, and care plan provided today and declined offer to receive copy of patient instructions, educational materials, and care plan.   Telephone follow up appointment with care management team member scheduled for:05/06/21  [name] MSW, LCSW Licensed Clinical Social Worker [place] [phone]

## 2021-04-09 ENCOUNTER — Other Ambulatory Visit (HOSPITAL_COMMUNITY): Payer: Self-pay

## 2021-04-10 ENCOUNTER — Other Ambulatory Visit (HOSPITAL_COMMUNITY): Payer: Self-pay

## 2021-04-13 DIAGNOSIS — E1140 Type 2 diabetes mellitus with diabetic neuropathy, unspecified: Secondary | ICD-10-CM

## 2021-04-15 ENCOUNTER — Other Ambulatory Visit: Payer: Self-pay

## 2021-04-15 ENCOUNTER — Other Ambulatory Visit (HOSPITAL_BASED_OUTPATIENT_CLINIC_OR_DEPARTMENT_OTHER): Payer: Self-pay

## 2021-04-15 ENCOUNTER — Ambulatory Visit: Payer: HMO | Attending: Internal Medicine

## 2021-04-15 DIAGNOSIS — Z23 Encounter for immunization: Secondary | ICD-10-CM

## 2021-04-15 MED ORDER — PFIZER COVID-19 VAC BIVALENT 30 MCG/0.3ML IM SUSP
INTRAMUSCULAR | 0 refills | Status: DC
  Filled 2021-04-15: qty 0.3, 1d supply, fill #0

## 2021-04-15 NOTE — Progress Notes (Signed)
   Covid-19 Vaccination Clinic  Name:  [name]    MRN: [id number] DOB: 1951-03-26  04/15/2021  [name] was observed post Covid-19 immunization for 15 minutes without incident. He was provided with Vaccine Information Sheet and instruction to access the V-Safe system.   [name] was instructed to call 911 with any severe reactions post vaccine: Difficulty breathing  Swelling of face and throat  A fast heartbeat  A bad rash all over body  Dizziness and weakness   Immunizations Administered     Name Date Dose VIS Date Route   Pfizer Covid-19 Vaccine Bivalent Booster 04/15/2021  2:25 PM 0.3 mL 02/11/2021 Intramuscular   Manufacturer: [place]   Lot: [id number]   [place]: [phone]

## 2021-04-24 DIAGNOSIS — L03032 Cellulitis of left toe: Secondary | ICD-10-CM | POA: Diagnosis not present

## 2021-04-24 DIAGNOSIS — W228XXA Striking against or struck by other objects, initial encounter: Secondary | ICD-10-CM | POA: Diagnosis not present

## 2021-04-24 DIAGNOSIS — S90932A Unspecified superficial injury of left great toe, initial encounter: Secondary | ICD-10-CM | POA: Diagnosis not present

## 2021-04-27 ENCOUNTER — Other Ambulatory Visit: Payer: Self-pay | Admitting: Surgery

## 2021-04-27 DIAGNOSIS — Z8679 Personal history of other diseases of the circulatory system: Secondary | ICD-10-CM

## 2021-04-27 DIAGNOSIS — I71019 Dissection of thoracic aorta, unspecified: Secondary | ICD-10-CM

## 2021-04-29 ENCOUNTER — Other Ambulatory Visit: Payer: Self-pay | Admitting: Internal Medicine

## 2021-04-29 ENCOUNTER — Other Ambulatory Visit (HOSPITAL_COMMUNITY): Payer: Self-pay

## 2021-04-29 MED ORDER — ROSUVASTATIN CALCIUM 10 MG PO TABS
10.0000 mg | ORAL_TABLET | Freq: Every day | ORAL | 1 refills | Status: DC
  Filled 2021-04-29: qty 90, 90d supply, fill #0

## 2021-05-05 ENCOUNTER — Ambulatory Visit: Payer: HMO | Admitting: Podiatry

## 2021-05-05 ENCOUNTER — Other Ambulatory Visit: Payer: Self-pay

## 2021-05-05 ENCOUNTER — Other Ambulatory Visit (HOSPITAL_COMMUNITY): Payer: Self-pay

## 2021-05-05 DIAGNOSIS — S90212A Contusion of left great toe with damage to nail, initial encounter: Secondary | ICD-10-CM | POA: Diagnosis not present

## 2021-05-05 MED ORDER — CEPHALEXIN 500 MG PO CAPS
500.0000 mg | ORAL_CAPSULE | Freq: Three times a day (TID) | ORAL | 0 refills | Status: AC
  Filled 2021-05-05: qty 15, 5d supply, fill #0

## 2021-05-05 NOTE — Progress Notes (Signed)
  Subjective:  Patient ID: [name], male    DOB: 11-16-1950,  MRN: [id number]  Chief Complaint  Patient presents with   Nail Problem      L hit great toe- infected  diabetic    70 y.o. male presents with the above complaint. History confirmed with patient.  Was traveling last week and walking through the airport with new shoes on and noticed bleeding under the toenail.  He went to urgent care the next day and they attempted to perform an aspiration of the hematoma.  Was not successful unsure if they got through the nail plate completely.  Put him on Keflex for a week.  Objective:  Physical Exam: warm, good capillary refill, normal DP and PT pulses, normal sensory exam, and left hallux nail with complete subungual hematoma well organized and no active bleeding noted, minimal pain with pressure or palpation. Assessment:   1. Contusion of left great toe with damage to nail, initial encounter      Plan:  Patient was evaluated and treated and all questions answered.  Reviewed the treatment options for subungual hematoma with him.  At this point the nail is well adhered and not loose, I do not think further aspiration will be able to express any further blood at this point.  Can leave open to air and allow it to grow out.  Suspect he will lose the nail at some point with new nail growing in.  I would like to keep him on Keflex for another 5 days and this sent to his pharmacy.  If nail becomes loose and requires avulsion then he will return to see me as needed for this.  With lack of impact trauma do not think x-ray was necessary.  Return as needed   Return if symptoms worsen or fail to improve.

## 2021-05-06 ENCOUNTER — Ambulatory Visit (INDEPENDENT_AMBULATORY_CARE_PROVIDER_SITE_OTHER): Payer: HMO | Admitting: *Deleted

## 2021-05-06 DIAGNOSIS — F419 Anxiety disorder, unspecified: Secondary | ICD-10-CM

## 2021-05-06 DIAGNOSIS — E1140 Type 2 diabetes mellitus with diabetic neuropathy, unspecified: Secondary | ICD-10-CM

## 2021-05-06 DIAGNOSIS — F32A Depression, unspecified: Secondary | ICD-10-CM

## 2021-05-06 NOTE — Chronic Care Management (AMB) (Signed)
Chronic Care Management    Clinical Social Work Note  05/06/2021 Name: [name] MRN: [id number] DOB: March 07, 1951  [name] is a 70 y.o. year old male who is a primary care patient of Sharlet Salina Real Cons, MD. The CCM team was consulted to assist the patient with chronic disease management and/or care coordination needs related to: Mental Health Counseling and Resources.   Engaged with patient by telephone for follow up visit in response to provider referral for social work chronic care management and care coordination services.   Consent to Services:  The patient was given information about Chronic Care Management services, agreed to services, and gave verbal consent prior to initiation of services.  Please see initial visit note for detailed documentation.   Patient agreed to services and consent obtained.   Assessment: Review of patient past medical history, allergies, medications, and health status, including review of relevant consultants reports was performed today as part of a comprehensive evaluation and provision of chronic care management and care coordination services.     SDOH (Social Determinants of Health) assessments and interventions performed:    Advanced Directives Status: Not addressed in this encounter.  CCM Care Plan  Allergies  Allergen Reactions   Testosterone Shortness Of Breath    Medication:Androgel Pt states trouble breathing at night   Codeine Nausea And Vomiting   Gabapentin     "drunk" feeling   Ultram [Tramadol] Nausea And Vomiting   Albuterol Other (See Comments)    Panic attack    Outpatient Encounter Medications as of 05/06/2021  Medication Sig   ALPRAZolam (XANAX) 1 MG tablet TAKE 1/2 TABLET BY MOUTH 4 TIMES A DAY   amoxicillin (AMOXIL) 500 MG capsule TAKE 1 CAPSULE BY MOUTH 3 TIMES DAILY UNTIL GONE (Patient not taking: Reported on 03/11/2021)   aspirin EC 81 MG tablet Take 81 mg by mouth daily with lunch.   azelastine  (ASTELIN) 0.1 % nasal spray Place 2 sprays into both nostrils 2 (two) times daily. Office visit is due   azelastine (ASTELIN) 0.1 % nasal spray Place 2 sprays into both nostrils 2 (two) times daily.   bisacodyl (DULCOLAX) 10 MG suppository Place 1 suppository (10 mg total) rectally daily as needed for moderate constipation.   calcium carbonate (TUMS EX) 750 MG chewable tablet Chew 1 tablet by mouth daily as needed for heartburn.    cephALEXin (KEFLEX) 500 MG capsule Take 1 capsule (500 mg total) by mouth 3 (three) times daily for 5 days.   Cetirizine HCl (ZYRTEC PO) Take by mouth.   cholecalciferol (VITAMIN D) 1000 UNITS tablet Take 2,000 Units by mouth daily.   COVID-19 mRNA bivalent vaccine, Pfizer, (PFIZER COVID-19 VAC BIVALENT) injection Inject into the muscle.   COVID-19 mRNA Vac-TriS, Pfizer, SUSP injection Inject into the muscle.   EPINEPHrine 0.3 mg/0.3 mL IJ SOAJ injection INJECT 0.3 MLS INTO THE MUSCLE ONCE AS DIRECTED   fexofenadine (ALLEGRA) 180 MG tablet Take 90 mg by mouth 2 (two) times daily.   fluticasone (FLONASE) 50 MCG/ACT nasal spray PLACE 2 SPRAYS INTO BOTH NOSTRILS DAILY.   hydrochlorothiazide (HYDRODIURIL) 25 MG tablet TAKE 1 TABLET (25 MG TOTAL) BY MOUTH DAILY. ANNUAL APPT DUE IN OCT MUST SEE PROVIDER FOR FUTURE REFILLS   influenza vaccine adjuvanted (FLUAD) 0.5 ML injection Inject 0.5 mLs into the muscle.   metFORMIN (GLUCOPHAGE-XR) 500 MG 24 hr tablet TAKE 3 TABLETS (1,500 MG TOTAL) BY MOUTH DAILY WITH BREAKFAST.   metoprolol tartrate (LOPRESSOR) 25 MG tablet Take  1 tablet (25 mg total) by mouth 3 (three) times daily.   montelukast (SINGULAIR) 10 MG tablet TAKE 1/2 TABLET BY MOUTH 2 TIMES DAILY.   Multiple Vitamin (MULTIVITAMIN WITH MINERALS) TABS Take 1 tablet by mouth daily.   omega-3 acid ethyl esters (LOVAZA) 1 g capsule TAKE 1 CAPSULE (1 G TOTAL) BY MOUTH 2 (TWO) TIMES DAILY. ANNUAL APPT DUE IN OCT MUST SEE PROVIDER FOR FUTURE REFILLS   OVER THE COUNTER MEDICATION  Apply 1 application topically daily as needed (for acne). Oxy 10 vanishing cream   polyethylene glycol powder (GLYCOLAX/MIRALAX) powder MIX 17G WITH LIQUID AND DRINK BY MOUTH DAILY.   PREVIDENT 5000 DRY MOUTH 1.1 % GEL dental gel APPLY THIN RIBBON AND BRUSH FOR 2 MINUTES DAILY IN PLACE OF CONVENTIONAL TOOTHPASTE   rosuvastatin (CRESTOR) 10 MG tablet Take 1 tablet (10 mg total) by mouth daily.   sodium fluoride (PREVIDENT) 1.1 % GEL dental gel Apply thin ribbon/pea-sized amount to toothbrush. Brush teeth thoroughly, for at least 2 min. Use daily in place of conventional toothpaste.   thiamine (VITAMIN B-1) 100 MG tablet Take 100 mg by mouth 2 (two) times daily at 10 AM and 5 PM.    TURMERIC CURCUMIN PO Take by mouth.   vitamin B-12 (CYANOCOBALAMIN) 500 MCG tablet Take 500-1,000 mcg by mouth daily.    [DISCONTINUED] oxyCODONE (OXY IR/ROXICODONE) 5 MG immediate release tablet Take 1 tablet (5 mg total) by mouth every 4 (four) hours as needed.   No facility-administered encounter medications on file as of 05/06/2021.    Patient Active Problem List   Diagnosis Date Noted   History of aortic dissection 05/28/2020   Genitourinary complaints 04/04/2020   Elevated LFTs 03/20/2020   Benign prostatic hyperplasia without lower urinary tract symptoms 03/20/2020   Skin lesion of scalp 03/20/2020   Pseudodementia 03/05/2020   Cerebral microvascular disease 03/05/2020   Idiopathic small fiber sensory neuropathy 03/05/2020   Severe episode of recurrent major depressive disorder, without psychotic features ([place]) 03/05/2020   Colon cancer screening 10/12/2019   Coronary artery calcification seen on CAT scan 03/17/2018   Ischemic brain damage 08/17/2017   Routine general medical examination at a health care facility 07/18/2015   Thyroid nodule 04/21/2015   AAA (abdominal aortic aneurysm) without rupture ([place]) 04/15/2015   MCI (mild cognitive impairment) 08/26/2014   Benign paroxysmal positional vertigo  08/26/2014   Peripheral neuropathy 03/21/2013   DM2 (diabetes mellitus, type 2) ([place])    Brachial plexopathy    Anxiety    Occlusion and stenosis of carotid artery without mention of cerebral infarction 04/11/2012   AAA (abdominal aortic aneurysm) 03/17/2012   Vitamin D deficiency 12/17/2008   Hyperlipidemia associated with type 2 diabetes mellitus ([place]) 12/17/2008   OBSTRUCTIVE SLEEP APNEA 12/17/2008   Migraine 12/17/2008   ALLERGIC RHINITIS 12/17/2008   Asthma 12/17/2008   ERECTILE DYSFUNCTION, ORGANIC 12/17/2008    Conditions to be addressed/monitored: Anxiety and Depression; Mental Health Concerns   Care Plan : LCSW Plan of Care  Updates made by Deirdre Peer, LCSW since 05/06/2021 12:00 AM     Problem: Need for patient centered plan of care to promote improved quality of life   Priority: High     Long-Range Goal: Develop self management plan to improve quality of life   Start Date: 07/23/2020  Expected End Date: 06/12/2021  This Visit's Progress: On track  Recent Progress: On track  Priority: High  Note:    Current barriers:   Continued feelings of  being alone/isolated "wanting to have meaningful intellectual conversations" Limited social/support Limited self health management plan in place  to address perceived decline in quality of life related to retirement, COVID isolation, etc. Negative thoughts and feelings around self, relationships and isolation. Clinical Goal(s): patient will work with SW to reduce or manage symptoms related to feelings of decrease in quality of life Clinical Interventions:  Pt looking forward to both Thanksgiving and his birthday tomorrow with family- will be traveling to [place], [place] to his daughter's new home.  Pt also has recently returned from a visit with his siblings in [place], [place]- injured his foot in airport and is on the mend.Pt continues with his counseling visits- encouraged him to discuss his feelings of apathy at next visit as pt  states he sometimes has an "I don't care if this plane I'm in crashes" attitude.  Validated his feelings-         CSW encouraged pt to seek more social outings/activities and to reach out of needs arise prior to follow up call Assessed patient's previous treatment, needs, coping skills, current treatment, support system and barriers to care Patient interviewed and appropriate assessments performed or reviewed:;Suicidal Ideation/Homicidal Ideation: No Reviewed medications with patient prescribed by PCP and discussed adherence- remains resistant/hesitant to SSRI's due to negative side effects experienced in past  Other interventions include: Depression screen reviewed , Solution-Focused Strategies, Active listening / Reflection utilized , Emotional Supportive Provided, Problem Solving /Task Center , Psychoeducation /Health Education, Motivational Interviewing, Brief CBT , Reviewed mental health medications with patient  and Participation in support group encouraged   Collaboration with PCP regarding development and update of comprehensive plan of care as evidenced by provider attestation and co-signature Inter-disciplinary care team collaboration (see longitudinal plan of care) Patient Goals/Self-Care Activities:   -call 911 for any emergency/crisis needs - continue with individual therapy/counseling sessions   - continue to seek ways to engage with friends, neighbors, old co-workers, Social research officer, government - check into the  volunteer opportunities in more depth  - perform a random act of kindness - practice positive self thoughts - continue social outings to [employer] and senior center engaging with others  -continue to seek activities that are of interest    -Follow Up Plan: 06/09/2021 11:30 am      Follow Up Plan: Appointment scheduled for SW follow up with client by phone on: 06/09/21      [name] MSW, [place] Licensed Clinical Social Worker [place] [phone]

## 2021-05-06 NOTE — Patient Instructions (Signed)
Visit Information  Thank you for taking time to visit with me today. Please don't hesitate to contact me if I can be of assistance to you before our next scheduled telephone appointment.  Following are the goals we discussed today:  (Copy and paste patient goals from clinical care plan here)  Our next appointment is by telephone on 06/09/21 at 11:30  Please call the care guide team at [phone] if you need to cancel or reschedule your appointment.   Please call the Suicide and Crisis Lifeline: 988 call the [place] National Suicide Prevention Lifeline: 470 724 5901 or TTY: (727)874-9428 TTY 276 308 7919) to talk to a trained counselor call 1-800-273-TALK (toll free, 24 hour hotline) go to [facility] [address], [place] [phone]) call 911 if you are experiencing a Mental Health or [place] or need someone to talk to.  Patient verbalizes understanding of instructions provided today and agrees to view in [place].  [name] MSW, LCSW Licensed Clinical Social Worker [facility] [place] [phone]

## 2021-05-13 ENCOUNTER — Other Ambulatory Visit: Payer: Self-pay | Admitting: Internal Medicine

## 2021-05-13 ENCOUNTER — Other Ambulatory Visit (HOSPITAL_COMMUNITY): Payer: Self-pay

## 2021-05-13 DIAGNOSIS — F32A Depression, unspecified: Secondary | ICD-10-CM | POA: Diagnosis not present

## 2021-05-13 DIAGNOSIS — E1140 Type 2 diabetes mellitus with diabetic neuropathy, unspecified: Secondary | ICD-10-CM | POA: Diagnosis not present

## 2021-05-13 MED ORDER — ALPRAZOLAM 1 MG PO TABS
0.5000 mg | ORAL_TABLET | Freq: Four times a day (QID) | ORAL | 5 refills | Status: DC
  Filled 2021-05-13: qty 60, 30d supply, fill #0
  Filled 2021-06-15: qty 60, 30d supply, fill #1
  Filled 2021-07-23: qty 60, 30d supply, fill #2
  Filled 2021-08-24: qty 60, 30d supply, fill #3
  Filled 2021-09-28: qty 60, 30d supply, fill #4
  Filled 2021-10-29: qty 60, 30d supply, fill #5

## 2021-05-13 MED FILL — Fluticasone Propionate Nasal Susp 50 MCG/ACT: NASAL | 30 days supply | Qty: 16 | Fill #1 | Status: AC

## 2021-05-14 DIAGNOSIS — F411 Generalized anxiety disorder: Secondary | ICD-10-CM | POA: Diagnosis not present

## 2021-05-18 DIAGNOSIS — E041 Nontoxic single thyroid nodule: Secondary | ICD-10-CM | POA: Diagnosis not present

## 2021-05-18 DIAGNOSIS — E1142 Type 2 diabetes mellitus with diabetic polyneuropathy: Secondary | ICD-10-CM | POA: Diagnosis not present

## 2021-05-18 DIAGNOSIS — R5383 Other fatigue: Secondary | ICD-10-CM | POA: Diagnosis not present

## 2021-05-25 ENCOUNTER — Other Ambulatory Visit (HOSPITAL_COMMUNITY): Payer: Self-pay

## 2021-06-02 ENCOUNTER — Other Ambulatory Visit: Payer: HMO

## 2021-06-02 ENCOUNTER — Ambulatory Visit: Payer: HMO

## 2021-06-04 ENCOUNTER — Other Ambulatory Visit: Payer: Self-pay

## 2021-06-04 ENCOUNTER — Other Ambulatory Visit (HOSPITAL_COMMUNITY): Payer: Self-pay

## 2021-06-04 ENCOUNTER — Ambulatory Visit: Payer: HMO | Admitting: Podiatry

## 2021-06-04 DIAGNOSIS — S90212D Contusion of left great toe with damage to nail, subsequent encounter: Secondary | ICD-10-CM

## 2021-06-04 DIAGNOSIS — S90122A Contusion of left lesser toe(s) without damage to nail, initial encounter: Secondary | ICD-10-CM | POA: Diagnosis not present

## 2021-06-04 DIAGNOSIS — S90415A Abrasion, left lesser toe(s), initial encounter: Secondary | ICD-10-CM

## 2021-06-04 MED ORDER — MUPIROCIN 2 % EX OINT
TOPICAL_OINTMENT | Freq: Three times a day (TID) | CUTANEOUS | 0 refills | Status: DC
  Filled 2021-06-04: qty 22, 7d supply, fill #0

## 2021-06-04 MED ORDER — CEPHALEXIN 500 MG PO CAPS
500.0000 mg | ORAL_CAPSULE | Freq: Three times a day (TID) | ORAL | 0 refills | Status: AC
Start: 2021-06-04 — End: 2021-06-09
  Filled 2021-06-04: qty 15, 5d supply, fill #0

## 2021-06-04 NOTE — Progress Notes (Signed)
°  Subjective:  Patient ID: [name], male    DOB: Jan 07, 1951,  MRN: [id number]  Chief Complaint  Patient presents with   Blister      L swelling 2nd toe has blister - diabetic     70 y.o. male presents with the above complaint. History confirmed with patient.  Great toe seems to be improving some.  The second toe he has a new issue we are it has a bruised appearance and then he also stubbed it and tore a small piece of skin off.  Objective:  Physical Exam: warm, good capillary refill, normal DP and PT pulses, normal sensory exam, and left hallux nail with complete subungual hematoma, new nail is growing in, he has an abrasion of the left second toe with partial thickness abrasion no signs of infection small area of ecchymosis proximal to this over the DIPJ Assessment:   1. Abrasion of second toe of left foot, initial encounter   2. Contusion of lesser toe of left foot without damage to nail, initial encounter   3. Contusion of left great toe with damage to nail, subsequent encounter      Plan:  Patient was evaluated and treated and all questions answered.  Great toe seems to be improving there is new nail growing in.  He has a new abrasion on the second toe.  I put him on Keflex for 5 days as a precaution with therapy no current active signs of infection.  Small area of ecchymosis proximal to this I think likely is contusion said he was moving wood while wearing sandals possible he had a blunt injury here.  If not improving return to see me.   No follow-ups on file.

## 2021-06-05 ENCOUNTER — Other Ambulatory Visit (HOSPITAL_COMMUNITY): Payer: Self-pay

## 2021-06-09 ENCOUNTER — Other Ambulatory Visit: Payer: Self-pay | Admitting: Internal Medicine

## 2021-06-09 ENCOUNTER — Other Ambulatory Visit (HOSPITAL_COMMUNITY): Payer: Self-pay

## 2021-06-09 ENCOUNTER — Ambulatory Visit (INDEPENDENT_AMBULATORY_CARE_PROVIDER_SITE_OTHER): Payer: HMO | Admitting: *Deleted

## 2021-06-09 DIAGNOSIS — E1140 Type 2 diabetes mellitus with diabetic neuropathy, unspecified: Secondary | ICD-10-CM

## 2021-06-09 DIAGNOSIS — F32A Depression, unspecified: Secondary | ICD-10-CM

## 2021-06-09 DIAGNOSIS — F419 Anxiety disorder, unspecified: Secondary | ICD-10-CM

## 2021-06-09 MED ORDER — HYDROCHLOROTHIAZIDE 25 MG PO TABS
25.0000 mg | ORAL_TABLET | Freq: Every day | ORAL | 1 refills | Status: DC
  Filled 2021-06-09: qty 90, 90d supply, fill #0
  Filled 2021-09-07: qty 90, 90d supply, fill #1

## 2021-06-09 MED ORDER — MONTELUKAST SODIUM 10 MG PO TABS
5.0000 mg | ORAL_TABLET | Freq: Two times a day (BID) | ORAL | 1 refills | Status: DC
  Filled 2021-06-09: qty 90, 90d supply, fill #0
  Filled 2021-09-07: qty 90, 90d supply, fill #1

## 2021-06-09 NOTE — Patient Instructions (Signed)
Visit Information  Thank you for taking time to visit with me today. Please don't hesitate to contact me if I can be of assistance to you before our next scheduled telephone appointment.  Following are the goals we discussed today:  (Copy and paste patient goals from clinical care plan here)  Our next appointment is by telephone on 06/23/21 at 1130am  Please call the care guide team at [phone] if you need to cancel or reschedule your appointment.   If you are experiencing a Mental Health or [place] or need someone to talk to, please call the Suicide and Crisis Lifeline: 988 call the [place] National Suicide Prevention Lifeline: 352-252-9671 or TTY: (731) 554-8460 TTY 281-621-1140) to talk to a trained counselor call 1-800-273-TALK (toll free, 24 hour hotline) go to [facility] [address], [place] [phone]) call 911   Patient verbalizes understanding of instructions provided today and agrees to view in [place].   [name] MSW, LCSW Licensed Clinical Social Worker [facility] [phone]

## 2021-06-09 NOTE — Chronic Care Management (AMB) (Signed)
Chronic Care Management    Clinical Social Work Note  06/09/2021 Name: [name] MRN: [id number] DOB: Jul 17, 1950  [name] is a 70 y.o. year old male who is a primary care patient of Sharlet Salina Real Cons, MD. The CCM team was consulted to assist the patient with chronic disease management and/or care coordination needs related to: [employer]  and [employer].   Engaged with patient by telephone for follow up visit in response to provider referral for social work chronic care management and care coordination services.   Consent to Services:  The patient was given information about Chronic Care Management services, agreed to services, and gave verbal consent prior to initiation of services.  Please see initial visit note for detailed documentation.   Patient agreed to services and consent obtained.   Assessment: Review of patient past medical history, allergies, medications, and health status, including review of relevant consultants reports was performed today as part of a comprehensive evaluation and provision of chronic care management and care coordination services.     SDOH (Social Determinants of Health) assessments and interventions performed:    Advanced Directives Status: Not addressed in this encounter.  CCM Care Plan  Allergies  Allergen Reactions   Testosterone Shortness Of Breath    Medication:Androgel Pt states trouble breathing at night   Codeine Nausea And Vomiting   Dapagliflozin Other (See Comments)   Gabapentin     "drunk" feeling   Ultram [Tramadol] Nausea And Vomiting   Albuterol Other (See Comments)    Panic attack    Outpatient Encounter Medications as of 06/09/2021  Medication Sig   ALPRAZolam (XANAX) 1 MG tablet Take 0.5 tablets (0.5 mg total) by mouth 4 (four) times daily.   aspirin EC 81 MG tablet Take 81 mg by mouth daily with lunch.   azelastine (ASTELIN) 0.1 % nasal spray Place 2 sprays into  both nostrils 2 (two) times daily. Office visit is due   azelastine (ASTELIN) 0.1 % nasal spray Place 2 sprays into both nostrils 2 (two) times daily.   bisacodyl (DULCOLAX) 10 MG suppository Place 1 suppository (10 mg total) rectally daily as needed for moderate constipation.   calcium carbonate (TUMS EX) 750 MG chewable tablet Chew 1 tablet by mouth daily as needed for heartburn.    cephALEXin (KEFLEX) 500 MG capsule Take 1 capsule (500 mg total) by mouth 3 (three) times daily for 5 days.   Cetirizine HCl (ZYRTEC PO) Take by mouth.   cholecalciferol (VITAMIN D) 1000 UNITS tablet Take 2,000 Units by mouth daily.   COVID-19 mRNA bivalent vaccine, Pfizer, (PFIZER COVID-19 VAC BIVALENT) injection Inject into the muscle.   COVID-19 mRNA Vac-TriS, Pfizer, SUSP injection Inject into the muscle.   EPINEPHrine 0.3 mg/0.3 mL IJ SOAJ injection INJECT 0.3 MLS INTO THE MUSCLE ONCE AS DIRECTED   fexofenadine (ALLEGRA) 180 MG tablet Take 90 mg by mouth 2 (two) times daily.   fluticasone (FLONASE) 50 MCG/ACT nasal spray PLACE 2 SPRAYS INTO BOTH NOSTRILS DAILY.   hydrochlorothiazide (HYDRODIURIL) 25 MG tablet TAKE 1 TABLET (25 MG TOTAL) BY MOUTH DAILY. ANNUAL APPT DUE IN OCT MUST SEE PROVIDER FOR FUTURE REFILLS   influenza vaccine adjuvanted (FLUAD) 0.5 ML injection Inject 0.5 mLs into the muscle.   metFORMIN (GLUCOPHAGE-XR) 500 MG 24 hr tablet TAKE 3 TABLETS (1,500 MG TOTAL) BY MOUTH DAILY WITH BREAKFAST.   metoprolol tartrate (LOPRESSOR) 25 MG tablet Take 1 tablet (25 mg total) by mouth 3 (three) times  daily.   montelukast (SINGULAIR) 10 MG tablet TAKE 1/2 TABLET BY MOUTH 2 TIMES DAILY.   Multiple Vitamin (MULTIVITAMIN WITH MINERALS) TABS Take 1 tablet by mouth daily.   mupirocin ointment (BACTROBAN) 2 % Apply topically 3 (three) times daily.   omega-3 acid ethyl esters (LOVAZA) 1 g capsule TAKE 1 CAPSULE (1 G TOTAL) BY MOUTH 2 (TWO) TIMES DAILY. ANNUAL APPT DUE IN OCT MUST SEE PROVIDER FOR FUTURE REFILLS    OVER THE COUNTER MEDICATION Apply 1 application topically daily as needed (for acne). Oxy 10 vanishing cream   polyethylene glycol powder (GLYCOLAX/MIRALAX) powder MIX 17G WITH LIQUID AND DRINK BY MOUTH DAILY.   PREVIDENT 5000 DRY MOUTH 1.1 % GEL dental gel APPLY THIN RIBBON AND BRUSH FOR 2 MINUTES DAILY IN PLACE OF CONVENTIONAL TOOTHPASTE   rosuvastatin (CRESTOR) 10 MG tablet Take 1 tablet (10 mg total) by mouth daily.   sodium fluoride (PREVIDENT) 1.1 % GEL dental gel Apply thin ribbon/pea-sized amount to toothbrush. Brush teeth thoroughly, for at least 2 min. Use daily in place of conventional toothpaste.   thiamine (VITAMIN B-1) 100 MG tablet Take 100 mg by mouth 2 (two) times daily at 10 AM and 5 PM.    TURMERIC CURCUMIN PO Take by mouth.   vitamin B-12 (CYANOCOBALAMIN) 500 MCG tablet Take 500-1,000 mcg by mouth daily.    [DISCONTINUED] oxyCODONE (OXY IR/ROXICODONE) 5 MG immediate release tablet Take 1 tablet (5 mg total) by mouth every 4 (four) hours as needed.   No facility-administered encounter medications on file as of 06/09/2021.    Patient Active Problem List   Diagnosis Date Noted   History of aortic dissection 05/28/2020   Genitourinary complaints 04/04/2020   Elevated LFTs 03/20/2020   Benign prostatic hyperplasia without lower urinary tract symptoms 03/20/2020   Skin lesion of scalp 03/20/2020   Pseudodementia 03/05/2020   Cerebral microvascular disease 03/05/2020   Idiopathic small fiber sensory neuropathy 03/05/2020   Severe episode of recurrent major depressive disorder, without psychotic features ([place]) 03/05/2020   Colon cancer screening 10/12/2019   Coronary artery calcification seen on CAT scan 03/17/2018   Ischemic brain damage 08/17/2017   Routine general medical examination at a health care facility 07/18/2015   Thyroid nodule 04/21/2015   AAA (abdominal aortic aneurysm) without rupture ([place]) 04/15/2015   MCI (mild cognitive impairment) 08/26/2014   Benign  paroxysmal positional vertigo 08/26/2014   Peripheral neuropathy 03/21/2013   DM2 (diabetes mellitus, type 2) ([place])    Brachial plexopathy    Anxiety    Occlusion and stenosis of carotid artery without mention of cerebral infarction 04/11/2012   AAA (abdominal aortic aneurysm) 03/17/2012   Vitamin D deficiency 12/17/2008   Hyperlipidemia associated with type 2 diabetes mellitus ([place]) 12/17/2008   OBSTRUCTIVE SLEEP APNEA 12/17/2008   Migraine 12/17/2008   ALLERGIC RHINITIS 12/17/2008   Asthma 12/17/2008   ERECTILE DYSFUNCTION, ORGANIC 12/17/2008    Conditions to be addressed/monitored: DMII, Anxiety, and Depression; Limited social support, Mental Health Concerns , and Social Isolation  Care Plan : LCSW Plan of Care  Updates made by Deirdre Peer, LCSW since 06/09/2021 12:00 AM     Problem: Need for patient centered plan of care to promote improved quality of life   Priority: High     Long-Range Goal: Develop self management plan to improve quality of life   Start Date: 07/23/2020  Expected End Date: 07/13/2021  Recent Progress: On track  Priority: High  Note:    Current barriers:  Continued feelings of being alone/isolated "wanting to have meaningful intellectual conversations" Limited social/support Limited self health management plan in place  to address perceived decline in quality of life related to retirement, COVID isolation, etc. Negative thoughts and feelings around self, relationships and isolation. Clinical Goal(s): patient will work with SW to reduce or manage symptoms related to feelings of decrease in quality of life Clinical Interventions:  Pt shared stories about his holidays- a daughter became sick with COVID and their celebrations are on hold. Pt also shared he continues to be worried about a lot of things including his car.  "I am still searching for a sense of identity and purpose". Pt is attending counseling on a monthly basis- suggested he might talk with  his counselor about his concerns as well as considering more frequent visits (every 3 weeks?) . He was able to attend and enjoy a zoom meeting with the  [facility] and is hoping to connect with more topics of interest when he can. Suggested he seek opportunities for volunteering and/or part-time job in a setting he enjoys, etc. CSW reminded pt of plans to transition him in the new year to a colleague taking over. Will plan to update him once more is know in the next few weeks.    Assessed patient's previous treatment, needs, coping skills, current treatment, support system and barriers to care Patient interviewed and appropriate assessments performed or reviewed:;Suicidal Ideation/Homicidal Ideation: No Reviewed medications with patient prescribed by PCP and discussed adherence- remains resistant/hesitant to [facility] due to negative side effects experienced in past  Other interventions include: Depression screen reviewed , Solution-Focused Strategies, Active listening / Reflection utilized , Emotional Supportive Provided, Problem Solving /Task Center , Psychoeducation /Health Education, Motivational Interviewing, Brief CBT , Reviewed mental health medications with patient  and Participation in support group encouraged   Collaboration with PCP regarding development and update of comprehensive plan of care as evidenced by provider attestation and co-signature Inter-disciplinary care team collaboration (see longitudinal plan of care) Patient Goals/Self-Care Activities:   -call 911 for any emergency/crisis needs - continue with individual therapy/counseling sessions   - continue to seek ways to engage with friends, neighbors, old co-workers, Social research officer, government - check into the  volunteer opportunities in more depth  - perform a random act of kindness - practice positive self thoughts - continue social outings to [employer] and senior center engaging with others  -continue to seek activities that are of interest     -Follow Up Plan: 06/23/2021 11:30 am      Follow Up Plan: Appointment scheduled for SW follow up with client by phone on: 06/23/21   [name] MSW, [place] Licensed Clinical Social Worker [place] [phone]

## 2021-06-10 DIAGNOSIS — H04123 Dry eye syndrome of bilateral lacrimal glands: Secondary | ICD-10-CM | POA: Diagnosis not present

## 2021-06-10 DIAGNOSIS — E119 Type 2 diabetes mellitus without complications: Secondary | ICD-10-CM | POA: Diagnosis not present

## 2021-06-10 DIAGNOSIS — H10413 Chronic giant papillary conjunctivitis, bilateral: Secondary | ICD-10-CM | POA: Diagnosis not present

## 2021-06-10 DIAGNOSIS — H401131 Primary open-angle glaucoma, bilateral, mild stage: Secondary | ICD-10-CM | POA: Diagnosis not present

## 2021-06-10 DIAGNOSIS — H25813 Combined forms of age-related cataract, bilateral: Secondary | ICD-10-CM | POA: Diagnosis not present

## 2021-06-13 DIAGNOSIS — E1140 Type 2 diabetes mellitus with diabetic neuropathy, unspecified: Secondary | ICD-10-CM | POA: Diagnosis not present

## 2021-06-13 DIAGNOSIS — F32A Depression, unspecified: Secondary | ICD-10-CM | POA: Diagnosis not present

## 2021-06-15 ENCOUNTER — Other Ambulatory Visit (HOSPITAL_COMMUNITY): Payer: Self-pay

## 2021-06-15 ENCOUNTER — Other Ambulatory Visit: Payer: Self-pay | Admitting: Internal Medicine

## 2021-06-16 ENCOUNTER — Other Ambulatory Visit (HOSPITAL_COMMUNITY): Payer: Self-pay

## 2021-06-16 ENCOUNTER — Ambulatory Visit (INDEPENDENT_AMBULATORY_CARE_PROVIDER_SITE_OTHER): Payer: HMO | Admitting: Internal Medicine

## 2021-06-16 ENCOUNTER — Encounter: Payer: Self-pay | Admitting: Internal Medicine

## 2021-06-16 ENCOUNTER — Other Ambulatory Visit: Payer: Self-pay

## 2021-06-16 VITALS — BP 124/80 | HR 66 | Resp 18 | Ht 76.0 in | Wt 224.0 lb

## 2021-06-16 DIAGNOSIS — Z0001 Encounter for general adult medical examination with abnormal findings: Secondary | ICD-10-CM

## 2021-06-16 DIAGNOSIS — G6289 Other specified polyneuropathies: Secondary | ICD-10-CM

## 2021-06-16 DIAGNOSIS — Z0000 Encounter for general adult medical examination without abnormal findings: Secondary | ICD-10-CM | POA: Diagnosis not present

## 2021-06-16 DIAGNOSIS — E785 Hyperlipidemia, unspecified: Secondary | ICD-10-CM

## 2021-06-16 DIAGNOSIS — R7989 Other specified abnormal findings of blood chemistry: Secondary | ICD-10-CM

## 2021-06-16 DIAGNOSIS — F419 Anxiety disorder, unspecified: Secondary | ICD-10-CM | POA: Diagnosis not present

## 2021-06-16 DIAGNOSIS — E1140 Type 2 diabetes mellitus with diabetic neuropathy, unspecified: Secondary | ICD-10-CM | POA: Diagnosis not present

## 2021-06-16 DIAGNOSIS — E041 Nontoxic single thyroid nodule: Secondary | ICD-10-CM | POA: Diagnosis not present

## 2021-06-16 DIAGNOSIS — E1169 Type 2 diabetes mellitus with other specified complication: Secondary | ICD-10-CM | POA: Diagnosis not present

## 2021-06-16 LAB — CBC
HCT: 45.7 % (ref 39.0–52.0)
Hemoglobin: 15.3 g/dL (ref 13.0–17.0)
MCHC: 33.5 g/dL (ref 30.0–36.0)
MCV: 92.4 fl (ref 78.0–100.0)
Platelets: 156 10*3/uL (ref 150.0–400.0)
RBC: 4.95 Mil/uL (ref 4.22–5.81)
RDW: 13.3 % (ref 11.5–15.5)
WBC: 6.6 10*3/uL (ref 4.0–10.5)

## 2021-06-16 LAB — COMPREHENSIVE METABOLIC PANEL
ALT: 51 U/L (ref 0–53)
AST: 35 U/L (ref 0–37)
Albumin: 4.4 g/dL (ref 3.5–5.2)
Alkaline Phosphatase: 27 U/L — ABNORMAL LOW (ref 39–117)
BUN: 23 mg/dL (ref 6–23)
CO2: 29 mEq/L (ref 19–32)
Calcium: 9.6 mg/dL (ref 8.4–10.5)
Chloride: 98 mEq/L (ref 96–112)
Creatinine, Ser: 0.93 mg/dL (ref 0.40–1.50)
GFR: 84.61 mL/min (ref >60.00)
Glucose, Bld: 240 mg/dL — ABNORMAL HIGH (ref 70–99)
Potassium: 4.1 mEq/L (ref 3.5–5.1)
Sodium: 135 mEq/L (ref 135–145)
Total Bilirubin: 1.3 mg/dL — ABNORMAL HIGH (ref 0.2–1.2)
Total Protein: 7.3 g/dL (ref 6.0–8.3)

## 2021-06-16 LAB — LIPID PANEL
Cholesterol: 131 mg/dL (ref 0–200)
HDL: 38.7 mg/dL — ABNORMAL LOW (ref >39.00)
LDL Cholesterol: 73 mg/dL (ref 0–99)
NonHDL: 92.77
Total CHOL/HDL Ratio: 3
Triglycerides: 99 mg/dL (ref 0.0–149.0)
VLDL: 19.8 mg/dL (ref 0.0–40.0)

## 2021-06-16 LAB — VITAMIN B12: Vitamin B-12: 688 pg/mL (ref 211–911)

## 2021-06-16 LAB — MICROALBUMIN / CREATININE URINE RATIO
Creatinine,U: 88.2 mg/dL
Microalb Creat Ratio: 2.9 mg/g (ref 0.0–30.0)
Microalb, Ur: 2.6 mg/dL — ABNORMAL HIGH (ref 0.0–1.9)

## 2021-06-16 LAB — TSH: TSH: 1.17 u[IU]/mL (ref 0.35–5.50)

## 2021-06-16 LAB — HEMOGLOBIN A1C: Hgb A1c MFr Bld: 7.8 % — ABNORMAL HIGH (ref 4.6–6.5)

## 2021-06-16 LAB — T4, FREE: Free T4: 0.84 ng/dL (ref 0.60–1.60)

## 2021-06-16 MED ORDER — OMEGA-3-ACID ETHYL ESTERS 1 G PO CAPS
1.0000 g | ORAL_CAPSULE | Freq: Two times a day (BID) | ORAL | 3 refills | Status: DC
Start: 2021-06-16 — End: 2022-08-09
  Filled 2021-06-16: qty 180, 90d supply, fill #0
  Filled 2021-11-16: qty 180, 90d supply, fill #1
  Filled 2022-02-11: qty 180, 90d supply, fill #2
  Filled 2022-05-17: qty 180, 90d supply, fill #3

## 2021-06-16 MED ORDER — ROSUVASTATIN CALCIUM 10 MG PO TABS
10.0000 mg | ORAL_TABLET | Freq: Every day | ORAL | 3 refills | Status: DC
  Filled 2021-06-16: qty 90, 90d supply, fill #0
  Filled 2021-07-27: qty 11, 11d supply, fill #0
  Filled 2021-07-27: qty 79, 79d supply, fill #0
  Filled 2021-09-28: qty 90, 90d supply, fill #1
  Filled 2022-01-25: qty 90, 90d supply, fill #2
  Filled 2022-04-20: qty 90, 90d supply, fill #3

## 2021-06-16 NOTE — Progress Notes (Signed)
Subjective:   Patient ID: [name], male    DOB: 04/10/1951, 71 y.o.   MRN: [id number]  HPI Here for medicare wellness, no new complaints. Please see A/P for status and treatment of chronic medical problems.   Diet: DM since diabetic Physical activity: sedentary, walks Depression/mood screen: negative Hearing: intact to whispered voice, some loss Visual acuity: grossly normal with lens, performs annual eye exam  ADLs: capable Fall risk: none Home safety: good Cognitive evaluation: intact to orientation, naming, recall and repetition EOL planning: adv directives discussed  [place] Office Visit from 06/16/2021 in [place] at [facility]  PHQ-2 Total Score [place] Office Visit from 06/16/2021 in [place] at [facility]  PHQ-9 Total Score 0      Fall Risk 08/20/2014 06/21/2018 09/19/2019 04/04/2020 06/16/2021  Falls in the past year? No - 0 0 0  Was there an injury with Fall? - - 0 - 0  Fall Risk Category Calculator - - 0 - 0  Fall Risk Category - - Low - Low  Patient Fall Risk Level - Low fall risk Low fall risk Low fall risk -  Patient at Risk for Falls Due to Other (Comment) - - No Fall Risks -  Some encounter information is confidential and restricted. Go to Review Flowsheets activity to see all data.    I have personally reviewed and have noted 1. The patient's medical and social history - reviewed today no changes 2. Their use of alcohol, tobacco or illicit drugs 3. Their current medications and supplements 4. The patient's functional ability including ADL's, fall risks, home safety risks and hearing or visual impairment. 5. Diet and physical activities 6. Evidence for depression or mood disorders 7. Care team reviewed and updated 8.  The patient is not on an opioid pain medication.  Patient Care Team: Hoyt Koch, MD as PCP - General (Internal Medicine) Dahlia Byes, MD (Cardiothoracic Surgery) Pennie Banter,  [place] (Pharmacist) Thurman Coyer, DO (Sports Medicine) Dohmeier, Asencion Partridge, MD (Neurology) Deirdre Peer, LCSW as Social Worker (Licensed Clinical Social Worker) Lenord Fellers, Cleaster [place], [facility] as Pharmacist (Pharmacist) Past Medical History:  Diagnosis Date   AAA (abdominal aortic aneurysm)    ALLERGIC RHINITIS    Anemia    post op, treated /w Fe   Anxiety    Arthritis    OA, hands, - thumbs- specifically   Brachial plexopathy    double crush injury with median nerve damage on the right dominant hand.    COPD (chronic obstructive pulmonary disease) ([place])    also states he has been told that he has slight emphysema    Dissecting aortic aneurysm, thoracic 02/20/2012   s/p emergent surgical repair   DM2 (diabetes mellitus, type 2) ([place])    DVT (deep venous thrombosis) ([facility])    coumadin- x3 months , post op   ERECTILE DYSFUNCTION, ORGANIC    GERD (gastroesophageal reflux disease)    related to diet & anxiety    HYPERLIPIDEMIA    Hypertension    pt. followwed by Dr. Johnsie Cancel   MIGRAINE HEADACHE    induced from physical , emotional stress , but sometimes occur randomly, also experiences BPPV   OBSTRUCTIVE SLEEP APNEA 02/2008 sleep study   moderate & central- not able to tolerate CPAP    Thyroid nodule 04/21/2015   2.3 cm on right, incidental finding on 03/2015 CT chest - f/u US ordered   Unspecified vitamin  D deficiency    Past Surgical History:  Procedure Laterality Date   ABDOMINAL AORTIC ANEURYSM REPAIR N/A 11/01/2012   Procedure: ANEURYSM ABDOMINAL AORTIC REPAIR;  Surgeon: Mal Misty, MD;  Location: [place];  Service: Vascular;  Laterality: N/A;  Resection and Grafting of Abdominal Aortic Aneurysm,(AORTA BI ILIAC)   Nasal fx without repair     THORACIC AORTIC ANEURYSM REPAIR  02/20/2012   Procedure: THORACIC ASCENDING ANEURYSM REPAIR (AAA);  Surgeon: Ivin Poot, MD;  Location: [place];  Service: Open Heart Surgery;  Laterality: N/A;   Family History  Problem Relation Age of  Onset   Diabetes Mother    Stroke Mother    Dementia Mother    Deep vein thrombosis Father        PE   Varicose Veins Father    Heart disease Maternal Grandmother    Heart disease Maternal Uncle    Heart disease Paternal Uncle    Heart disease Cousin    Lung cancer Cousin        2 cousins   Review of Systems  Constitutional:  Positive for activity change and fatigue.  HENT: Negative.    Eyes: Negative.   Respiratory:  Negative for cough, chest tightness and shortness of breath.   Cardiovascular:  Negative for chest pain, palpitations and leg swelling.  Gastrointestinal:  Negative for abdominal distention, abdominal pain, constipation, diarrhea, nausea and vomiting.  Musculoskeletal:  Positive for arthralgias.  Skin: Negative.   Neurological:  Positive for numbness.  Psychiatric/Behavioral: Negative.     Objective:  Physical Exam Constitutional:      Appearance: He is well-developed.  HENT:     Head: Normocephalic and atraumatic.  Cardiovascular:     Rate and Rhythm: Normal rate and regular rhythm.  Pulmonary:     Effort: Pulmonary effort is normal. No respiratory distress.     Breath sounds: Normal breath sounds. No wheezing or rales.  Abdominal:     General: Bowel sounds are normal. There is no distension.     Palpations: Abdomen is soft.     Tenderness: There is no abdominal tenderness. There is no rebound.  Musculoskeletal:     Cervical back: Normal range of motion.  Skin:    General: Skin is warm and dry.  Neurological:     Mental Status: He is alert and oriented to person, place, and time.     Coordination: Coordination normal.    Vitals:   06/16/21 1533  BP: 124/80  Pulse: 66  Resp: 18  SpO2: 98%  Weight: 224 lb (101.6 kg)  Height: 6\' 4"  (1.93 m)   This visit occurred during the SARS-CoV-2 public health emergency.  Safety protocols were in place, including screening questions prior to the visit, additional usage of staff PPE, and extensive cleaning of  exam room while observing appropriate contact time as indicated for disinfecting solutions.   Assessment & Plan:

## 2021-06-17 ENCOUNTER — Other Ambulatory Visit (HOSPITAL_COMMUNITY): Payer: Self-pay

## 2021-06-17 MED ORDER — METFORMIN HCL ER 500 MG PO TB24
1500.0000 mg | ORAL_TABLET | Freq: Every day | ORAL | 3 refills | Status: DC
Start: 2021-06-17 — End: 2022-05-31
  Filled 2021-06-17: qty 270, 90d supply, fill #0
  Filled 2021-09-07: qty 270, 90d supply, fill #1
  Filled 2021-12-11: qty 270, 90d supply, fill #2
  Filled 2022-03-10: qty 270, 90d supply, fill #3

## 2021-06-18 ENCOUNTER — Telehealth: Payer: HMO | Admitting: *Deleted

## 2021-06-19 NOTE — Assessment & Plan Note (Signed)
Checking TSH and free T4. Seeing endo intermittently.

## 2021-06-19 NOTE — Assessment & Plan Note (Signed)
Uses alprazolam and fill pattern without red flags.

## 2021-06-19 NOTE — Assessment & Plan Note (Signed)
Checking HgA1c and microalbumin to creatinine ratio. Up to date on eye exam and foot exam. Taking metformin. Not on ACE-I/ARB on statin. Adjust as needed.

## 2021-06-19 NOTE — Assessment & Plan Note (Signed)
Checking CMP.  

## 2021-06-19 NOTE — Assessment & Plan Note (Signed)
Taking crestor 10 mg daily and checking lipid panel adjust as needed.

## 2021-06-19 NOTE — Assessment & Plan Note (Signed)
Flu shot up to date. Covid-19 up to date. Pneumonia complete. Shingrix counseled. Tetanus due 2028. Cologuard due 2024. Counseled about sun safety and mole surveillance. Counseled about the dangers of distracted driving. Given 10 year screening recommendations.

## 2021-06-19 NOTE — Assessment & Plan Note (Signed)
Overall stable and taking B vitamins which seems to help.

## 2021-06-23 ENCOUNTER — Telehealth: Payer: HMO

## 2021-06-23 DIAGNOSIS — F411 Generalized anxiety disorder: Secondary | ICD-10-CM | POA: Diagnosis not present

## 2021-06-24 ENCOUNTER — Ambulatory Visit: Payer: HMO | Admitting: Physician Assistant

## 2021-06-24 ENCOUNTER — Telehealth: Payer: Self-pay | Admitting: *Deleted

## 2021-06-24 NOTE — Chronic Care Management (AMB) (Signed)
°  Care Management   Note  06/24/2021 Name: [name] MRN: [id number] DOB: [birth year]  [name] is a 71 y.o. year old male who is a primary care patient of Hoyt Koch, MD and is actively engaged with the care management team. I reached out to [name] by phone today to assist with re-scheduling a follow up visit with the Licensed Clinical Social Worker  Follow up plan: Unsuccessful telephone outreach attempt made. A HIPAA compliant phone message was left for the patient providing contact information and requesting a return call.  The care management team will reach out to the patient again over the next 7 days.  If patient returns call to provider office, please advise to call [place] at [place] [facility]  Direct Dial: [phone]

## 2021-06-24 NOTE — Progress Notes (Signed)
Cardiology Office Note    Date:  06/30/2021   ID:  [name], DOB 03-11-1951, MRN [id number]   PCP:  Hoyt Koch, MD   [place]  Cardiologist:  Jenkins Rouge, MD   Advanced Practice Provider:  No care team member to display Electrophysiologist:  None   [id number]}   Chief Complaint  Patient presents with   Follow-up    History of Present Illness:  [name] is a 71 y.o. 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 Status post repair of type A ascending dissection 02/19/12 PVT  with Hemi shield graft used to replace ascending aorta and hemi-arch reconstruction, resuspension of aortic valve Likely brachial plexus injury with axillary artery cannulation on right side.  Had staged  AAA repair by Dr Kellie Simmering 10/2012 CT done 05/28/20 and followed by VVS/CVTS reviewed with residual false lumen 4.2 cm   Echo 02/15/20 reviewed:  EF 60-65% mild AR mild AS mean gradient 11 mmHg,Cr has been normal at 1.04 07/22/20    Seen by primary 07/22/20 with ? TIA or complex migraine Had confusion and inability to use computer Accompanied by headache MRI head normal 08/17/20 Carotid 02/2019 Only had plaque no stenosis  Last saw Dr. Johnsie Cancel 09/12/20 and doing well.  Patient comes in for f/u. Complains of fatigue since 2013. Thinks part of it is metoprolol. Goes to gym twice a week 45 min used to go 3x/week but too fatigued. Labs including TSH this month. One episode of dizziness when he got up he thinks is due to nasal congestion. Chronic DOE going up a flight of stairs. BP 130's/60's  Past Medical History:  Diagnosis Date   AAA (abdominal aortic aneurysm)    ALLERGIC RHINITIS    Anemia    post op, treated /w Fe   Anxiety    Arthritis    OA, hands, - thumbs- specifically   Brachial plexopathy    double crush injury with median nerve damage on the right dominant hand.    COPD (chronic obstructive pulmonary disease) ([place])    also states he has  been told that he has slight emphysema    Dissecting aortic aneurysm, thoracic 02/20/2012   s/p emergent surgical repair   DM2 (diabetes mellitus, type 2) ([place])    DVT (deep venous thrombosis) (HCC)    coumadin- x3 months , post op   ERECTILE DYSFUNCTION, ORGANIC    GERD (gastroesophageal reflux disease)    related to diet & anxiety    HYPERLIPIDEMIA    Hypertension    pt. followwed by Dr. Johnsie Cancel   MIGRAINE HEADACHE    induced from physical , emotional stress , but sometimes occur randomly, also experiences BPPV   OBSTRUCTIVE SLEEP APNEA 02/2008 sleep study   moderate & central- not able to tolerate CPAP    Thyroid nodule 04/21/2015   2.3 cm on right, incidental finding on 03/2015 CT chest - f/u US ordered   Unspecified vitamin D deficiency     Past Surgical History:  Procedure Laterality Date   ABDOMINAL AORTIC ANEURYSM REPAIR N/A 11/01/2012   Procedure: ANEURYSM ABDOMINAL AORTIC REPAIR;  Surgeon: Mal Misty, MD;  Location: [facility] OR;  Service: Vascular;  Laterality: N/A;  Resection and Grafting of Abdominal Aortic Aneurysm,(AORTA BI ILIAC)   Nasal fx without repair     THORACIC AORTIC ANEURYSM REPAIR  02/20/2012   Procedure: THORACIC ASCENDING ANEURYSM REPAIR (AAA);  Surgeon: Ivin Poot, MD;  Location: [facility]  OR;  Service: Open Heart Surgery;  Laterality: N/A;    Current Medications: Current Meds  Medication Sig   ALPRAZolam (XANAX) 1 MG tablet Take 0.5 tablets (0.5 mg total) by mouth 4 (four) times daily.   aspirin EC 81 MG tablet Take 81 mg by mouth daily with lunch.   azelastine (ASTELIN) 0.1 % nasal spray Place 2 sprays into both nostrils 2 (two) times daily. Office visit is due   bisacodyl (DULCOLAX) 10 MG suppository Place 1 suppository (10 mg total) rectally daily as needed for moderate constipation.   calcium carbonate (TUMS EX) 750 MG chewable tablet Chew 1 tablet by mouth daily as needed for heartburn.    Cetirizine HCl (ZYRTEC PO) Take by mouth.   cholecalciferol  (VITAMIN D) 1000 UNITS tablet Take 2,000 Units by mouth daily.   EPINEPHrine 0.3 mg/0.3 mL IJ SOAJ injection INJECT 0.3 MLS INTO THE MUSCLE ONCE AS DIRECTED   fexofenadine (ALLEGRA) 180 MG tablet Take 90 mg by mouth 2 (two) times daily.   fluticasone (FLONASE) 50 MCG/ACT nasal spray PLACE 2 SPRAYS INTO BOTH NOSTRILS DAILY.   hydrochlorothiazide (HYDRODIURIL) 25 MG tablet Take 1 tablet (25 mg total) by mouth daily. ANNUAL APPT DUE IN OCT MUST SEE PROVIDER FOR FUTURE REFILLS   metFORMIN (GLUCOPHAGE-XR) 500 MG 24 hr tablet Take 3 tablets (1,500 mg total) by mouth daily with breakfast.   metoprolol tartrate (LOPRESSOR) 25 MG tablet Take 1 tablet (25 mg total) by mouth 3 (three) times daily.   montelukast (SINGULAIR) 10 MG tablet Take 0.5 tablets (5 mg total) by mouth 2 (two) times daily.   Multiple Vitamin (MULTIVITAMIN WITH MINERALS) TABS Take 1 tablet by mouth daily.   mupirocin ointment (BACTROBAN) 2 % Apply topically 3 (three) times daily.   omega-3 acid ethyl esters (LOVAZA) 1 g capsule Take 1 capsule (1 g total) by mouth 2 (two) times daily.   OVER THE COUNTER MEDICATION Apply 1 application topically daily as needed (for acne). Oxy 10 vanishing cream   polyethylene glycol powder (GLYCOLAX/MIRALAX) powder MIX 17G WITH LIQUID AND DRINK BY MOUTH DAILY.   PREVIDENT 5000 DRY MOUTH 1.1 % GEL dental gel APPLY THIN RIBBON AND BRUSH FOR 2 MINUTES DAILY IN PLACE OF CONVENTIONAL TOOTHPASTE   rosuvastatin (CRESTOR) 10 MG tablet Take 1 tablet (10 mg total) by mouth daily.   sodium fluoride (PREVIDENT) 1.1 % GEL dental gel Apply thin ribbon/pea-sized amount to toothbrush. Brush teeth thoroughly, for at least 2 min. Use daily in place of conventional toothpaste.   thiamine (VITAMIN B-1) 100 MG tablet Take 100 mg by mouth 2 (two) times daily at 10 AM and 5 PM.    TURMERIC CURCUMIN PO Take by mouth.   vitamin B-12 (CYANOCOBALAMIN) 500 MCG tablet Take 500-1,000 mcg by mouth daily.      Allergies:    Testosterone, Codeine, Dapagliflozin, Gabapentin, Ultram [tramadol], and Albuterol   Social History   Socioeconomic History   Marital status: Divorced    Spouse name: Not on file   Number of children: 2   Years of education: acad. deg.   Highest education level: Not on file  Occupational History   Occupation: internal med resident    Employer: [employer]  Tobacco Use   Smoking status: Former    Packs/day: 0.10    Types: Cigarettes    Quit date: 02/18/2012    Years since quitting: 9.3   Smokeless tobacco: Never   Tobacco comments:    10 cigs/day, quit 02/2012 after TAA  emergent repair  Vaping Use   Vaping Use: Never used  Substance and Sexual Activity   Alcohol use: Yes    Alcohol/week: 0.0 standard drinks    Comment: shot of Tequila every day    Drug use: No   Sexual activity: Not on file  Other Topics Concern   Not on file  Social History Narrative   Patient is single and lives at home alone.    Arboriculturist.   Patient works at [employer] full time.   Right handed.   Caffeine one cup of coffee daily.    Social Determinants of Health   Financial Resource Strain: Not on file  Food Insecurity: Not on file  Transportation Needs: Not on file  Physical Activity: Not on file  Stress: Not on file  Social Connections: Unknown   Frequency of Communication with Friends and Family: Not on file   Frequency of Social Gatherings with Friends and Family: Not on file   Attends Religious Services: Not on file   Active Member of Clubs or Organizations: Yes   Attends Archivist Meetings: Not on file   Marital Status: Not on file     Family History:  The patient's  family history includes Deep vein thrombosis in his father; Dementia in his mother; Diabetes in his mother; Heart disease in his cousin, maternal grandmother, maternal uncle, and paternal uncle; Lung cancer in his cousin; Stroke in his mother; Varicose Veins in his father.   ROS:   Please see the history  of present illness.    ROS All other systems reviewed and are negative.   PHYSICAL EXAM:   VS:  BP 126/74    Pulse (!) 58    Ht 6\' 4"  (1.93 m)    Wt 227 lb (103 kg)    SpO2 96%    BMI 27.63 kg/m   Physical Exam  GEN: Well nourished, well developed, in no acute distress  Neck: right bruit,no JVD,  or masses Cardiac:RRR; 2/6 systolic murmur LSB/RSB Respiratory:  clear to auscultation bilaterally, normal work of breathing GI: soft, nontender, nondistended, + BS Ext: without cyanosis, clubbing, or edema, Good distal pulses bilaterally Neuro:  Alert and Oriented x 3, Psych: euthymic mood, full affect  Wt Readings from Last 3 Encounters:  06/30/21 227 lb (103 kg)  06/16/21 224 lb (101.6 kg)  03/11/21 225 lb (102.1 kg)      Studies/Labs Reviewed:   EKG:  EKG is not ordered today.     Recent Labs: 06/16/2021: ALT 51; BUN 23; Creatinine, Ser 0.93; Hemoglobin 15.3; Platelets 156.0; Potassium 4.1; Sodium 135; TSH 1.17   Lipid Panel    Component Value Date/Time   CHOL 131 06/16/2021 1616   TRIG 99.0 06/16/2021 1616   TRIG 181 06/20/2008 0000   HDL 38.70 (L) 06/16/2021 1616   CHOLHDL 3 06/16/2021 1616   VLDL 19.8 06/16/2021 1616   LDLCALC 73 06/16/2021 1616   LDLCALC 36 06/20/2008 0000   LDLDIRECT 114 (H) 03/21/2010 0104    Additional studies/ records that were reviewed today include:    Echo 02/2021 IMPRESSIONS     1. Left ventricular ejection fraction, by estimation, is 60 to 65%. Left  ventricular ejection fraction by 3D volume is 62 %. The left ventricle has  normal function. The left ventricle has no regional wall motion  abnormalities. There is mild concentric  left ventricular hypertrophy. Left ventricular diastolic parameters are  consistent with Grade I diastolic dysfunction (impaired relaxation). The  average left ventricular global longitudinal strain is -27.6 %. The global  longitudinal strain is normal.   2. Right ventricular systolic function is normal. The  right ventricular  size is normal. There is mildly elevated pulmonary artery systolic  pressure. The estimated right ventricular systolic pressure is 82.4 mmHg.   3. The mitral valve is normal in structure. Mild mitral valve  regurgitation. No evidence of mitral stenosis.   4. The aortic valve is tricuspid. There is mild calcification of the  aortic valve. Aortic valve regurgitation is mild to moderate. Mild aortic  valve stenosis. Aortic regurgitation PHT measures 403 msec. Aortic valve  mean gradient measures 12.0 mmHg.   5. Aortic dilatation noted and root/ascending aorta has been  repaired/replaced. There is mild dilatation of the aortic root, measuring  41 mm.   6. The inferior vena cava is dilated in size with <50% respiratory  variability, suggesting right atrial pressure of 15 mmHg.   Comparison(s): A prior study was performed on 02/15/2020. 02/15/20 EF 60-65%.  Mild AI. Mild AS 68mmHg mean PG, 55mmHg peak PG; slight increase in AI  without increase in LV size or change in LV function.   Risk Assessment/Calculations:         ASSESSMENT:    1. Dissection of aorta, unspecified portion of aorta (HCC)   2. Abdominal aortic aneurysm (AAA) without rupture, unspecified part   3. Type 2 diabetes mellitus with hyperosmolarity without coma, without long-term current use of insulin ([place])   4. Hyperlipidemia associated with type 2 diabetes mellitus ([place])   5. Left carotid bruit   6. Nonrheumatic aortic valve insufficiency   7. Coronary artery calcification seen on CAT scan   8. Chronic fatigue      PLAN:  In order of problems listed above:  Type A Dissection: grafted root and resuspended valve stable by CT mild AS/AR f/u echo September 2022 mild AS 11 mg Hg mean 71mmHg slight increase in AI without increase in LV size or function. For repeat CT next week.  AAA:  F/U VVS false lumen stable by CT 05/28/20 4.2 cm  post endovascular repair    DM:  A1c is 7.8  managed by PCP   HLD:   LDL at goal continue statin     Bruit:  ? TIA last year  repeat carotids stable 09/15/20   AV:  Mild AS/AR by echo 23536 f/u echo in September   Coronary artery calcification on CTA 05/2020 on Crestor and lovaza LDL 73 06/16/21  Fatigue-chronic problem since 2013. He thinks metoprolol contributing to it but BP's running 130/60's at home so would have to start another drug if we decreased metoprolol. He doesn't want to change anything at this time.      Shared Decision Making/Informed Consent        Medication Adjustments/Labs and Tests Ordered: Current medicines are reviewed at length with the patient today.  Concerns regarding medicines are outlined above.  Medication changes, Labs and Tests ordered today are listed in the Patient Instructions below. Patient Instructions  Medication Instructions:  Your physician recommends that you continue on your current medications as directed. Please refer to the Current Medication list given to you today.  *If you need a refill on your cardiac medications before your next appointment, please call your pharmacy*   Lab Work: None ordered  If you have labs (blood work) drawn today and your tests are completely normal, you will receive your results only by: [place] (if you have  MyChart) OR A paper copy in the mail If you have any lab test that is abnormal or we need to change your treatment, we will call you to review the results.   Testing/Procedures: Your physician has requested that you have an echocardiogram in September. Echocardiography is a painless test that uses sound waves to create images of your heart. It provides your doctor with information about the size and shape of your heart and how well your hearts chambers and valves are working. This procedure takes approximately one hour. There are no restrictions for this procedure.    Follow-Up: Follow up with Dr. Jenkins Rouge after your echocardiogram    Other Instructions   The word "aneurysm" refers to a bulge in an artery (blood vessel). Most people think of them in the context of an emergency, but yours was found incidentally. At this point there is nothing you need to do from a procedure standpoint, but there are some important things to keep in mind for day-to-day life.  Mainstays of therapy for aneurysms include very good blood pressure control, healthy lifestyle, and avoiding tobacco products and street drugs. Research has raised concern that antibiotics in the fluoroquinolone class could be associated with increased risk of having an aneurysm develop or tear. This includes medicines that end in "floxacin," like Cipro or Levaquin. Make sure to discuss this information with other healthcare providers if you require antibiotics.  Since aneurysms can run in families, you should discuss your diagnosis with first degree relatives as they may need to be screened for this. Regular mild-moderate physical exercise is important, but avoid heavy lifting/weight lifting over 30lbs, chopping wood, shoveling snow or digging heavy earth with a shovel. It is best to avoid activities that cause grunting or straining (medically referred to as a "Valsalva maneuver"). This happens when a person bears down against a closed throat to increase the strength of arm or abdominal muscles. There's often a tendency to do this when lifting heavy weights, doing sit-ups, push-ups or chin-ups, etc., but it may be harmful.  This is a finding I would expect to be monitored periodically by your cardiology team. Most unruptured thoracic aortic aneurysms cause no symptoms, so they are often found during exams for other conditions. Contact a health care provider if you develop any discomfort in your upper back, neck, abdomen, trouble swallowing, cough or hoarseness, or unexplained weight loss. Get help right away if you develop severe pain in your upper back or abdomen that may move into your chest and arms, or  any other concerning symptoms such as shortness of breath or fever.    Signed, Ermalinda Barrios, PA-C  06/30/2021 2:36 PM    [facility] [place], [place], [place]  [address] Phone: [phone]; Fax: [phone]

## 2021-06-25 NOTE — Chronic Care Management (AMB) (Signed)
°  Care Management   Note  06/25/2021 Name: VIVIAN OKELLEY MRN: [id number] DOB: 1950/06/28  CORIE VAVRA is a 71 y.o. year old male who is a primary care patient of Hoyt Koch, MD and is actively engaged with the care management team. I reached out to [name] by phone today to assist with re-scheduling a follow up visit with the Licensed Clinical Social Worker  Follow up plan: Telephone appointment with care management team member scheduled for:07/14/21  [place] [facility]  Direct Dial: [phone]

## 2021-06-30 ENCOUNTER — Encounter: Payer: Self-pay | Admitting: Physician Assistant

## 2021-06-30 ENCOUNTER — Ambulatory Visit (INDEPENDENT_AMBULATORY_CARE_PROVIDER_SITE_OTHER): Payer: HMO | Admitting: Physician Assistant

## 2021-06-30 ENCOUNTER — Other Ambulatory Visit: Payer: Self-pay

## 2021-06-30 VITALS — BP 126/74 | HR 58 | Ht 76.0 in | Wt 227.0 lb

## 2021-06-30 DIAGNOSIS — I351 Nonrheumatic aortic (valve) insufficiency: Secondary | ICD-10-CM | POA: Diagnosis not present

## 2021-06-30 DIAGNOSIS — R5382 Chronic fatigue, unspecified: Secondary | ICD-10-CM

## 2021-06-30 DIAGNOSIS — I7100 Dissection of unspecified site of aorta: Secondary | ICD-10-CM

## 2021-06-30 DIAGNOSIS — I714 Abdominal aortic aneurysm, without rupture, unspecified: Secondary | ICD-10-CM

## 2021-06-30 DIAGNOSIS — E1100 Type 2 diabetes mellitus with hyperosmolarity without nonketotic hyperglycemic-hyperosmolar coma (NKHHC): Secondary | ICD-10-CM | POA: Diagnosis not present

## 2021-06-30 DIAGNOSIS — I2510 Atherosclerotic heart disease of native coronary artery without angina pectoris: Secondary | ICD-10-CM

## 2021-06-30 DIAGNOSIS — E785 Hyperlipidemia, unspecified: Secondary | ICD-10-CM

## 2021-06-30 DIAGNOSIS — R0989 Other specified symptoms and signs involving the circulatory and respiratory systems: Secondary | ICD-10-CM | POA: Diagnosis not present

## 2021-06-30 DIAGNOSIS — E1169 Type 2 diabetes mellitus with other specified complication: Secondary | ICD-10-CM | POA: Diagnosis not present

## 2021-06-30 NOTE — Patient Instructions (Addendum)
Medication Instructions:  Your physician recommends that you continue on your current medications as directed. Please refer to the Current Medication list given to you today.  *If you need a refill on your cardiac medications before your next appointment, please call your pharmacy*   Lab Work: None ordered  If you have labs (blood work) drawn today and your tests are completely normal, you will receive your results only by: [place] (if you have MyChart) OR A paper copy in the mail If you have any lab test that is abnormal or we need to change your treatment, we will call you to review the results.   Testing/Procedures: Your physician has requested that you have an echocardiogram in September. Echocardiography is a painless test that uses sound waves to create images of your heart. It provides your doctor with information about the size and shape of your heart and how well your hearts chambers and valves are working. This procedure takes approximately one hour. There are no restrictions for this procedure.    Follow-Up: Follow up with Dr. Jenkins Rouge after your echocardiogram    Other Instructions  The word "aneurysm" refers to a bulge in an artery (blood vessel). Most people think of them in the context of an emergency, but yours was found incidentally. At this point there is nothing you need to do from a procedure standpoint, but there are some important things to keep in mind for day-to-day life.  Mainstays of therapy for aneurysms include very good blood pressure control, healthy lifestyle, and avoiding tobacco products and street drugs. Research has raised concern that antibiotics in the fluoroquinolone class could be associated with increased risk of having an aneurysm develop or tear. This includes medicines that end in "floxacin," like Cipro or Levaquin. Make sure to discuss this information with other healthcare providers if you require antibiotics.  Since aneurysms can  run in families, you should discuss your diagnosis with first degree relatives as they may need to be screened for this. Regular mild-moderate physical exercise is important, but avoid heavy lifting/weight lifting over 30lbs, chopping wood, shoveling snow or digging heavy earth with a shovel. It is best to avoid activities that cause grunting or straining (medically referred to as a "Valsalva maneuver"). This happens when a person bears down against a closed throat to increase the strength of arm or abdominal muscles. There's often a tendency to do this when lifting heavy weights, doing sit-ups, push-ups or chin-ups, etc., but it may be harmful.  This is a finding I would expect to be monitored periodically by your cardiology team. Most unruptured thoracic aortic aneurysms cause no symptoms, so they are often found during exams for other conditions. Contact a health care provider if you develop any discomfort in your upper back, neck, abdomen, trouble swallowing, cough or hoarseness, or unexplained weight loss. Get help right away if you develop severe pain in your upper back or abdomen that may move into your chest and arms, or any other concerning symptoms such as shortness of breath or fever.

## 2021-07-07 ENCOUNTER — Other Ambulatory Visit: Payer: Self-pay

## 2021-07-07 ENCOUNTER — Ambulatory Visit: Payer: HMO | Admitting: Surgical

## 2021-07-07 ENCOUNTER — Ambulatory Visit
Admission: RE | Admit: 2021-07-07 | Discharge: 2021-07-07 | Disposition: A | Discharge status: Home / Self Care | Payer: HMO | Source: Ambulatory Visit | Attending: Surgery | Admitting: Surgery

## 2021-07-07 ENCOUNTER — Inpatient Hospital Stay: Admission: RE | Admit: 2021-07-07 | Payer: HMO | Source: Ambulatory Visit

## 2021-07-07 VITALS — BP 149/74 | HR 75 | Resp 20 | Ht 76.0 in | Wt 225.6 lb

## 2021-07-07 DIAGNOSIS — Z8679 Personal history of other diseases of the circulatory system: Secondary | ICD-10-CM

## 2021-07-07 DIAGNOSIS — K8020 Calculus of gallbladder without cholecystitis without obstruction: Secondary | ICD-10-CM | POA: Diagnosis not present

## 2021-07-07 DIAGNOSIS — I712 Thoracic aortic aneurysm, without rupture, unspecified: Secondary | ICD-10-CM | POA: Diagnosis not present

## 2021-07-07 DIAGNOSIS — J439 Emphysema, unspecified: Secondary | ICD-10-CM | POA: Diagnosis not present

## 2021-07-07 DIAGNOSIS — K5730 Diverticulosis of large intestine without perforation or abscess without bleeding: Secondary | ICD-10-CM | POA: Diagnosis not present

## 2021-07-07 MED ORDER — IOPAMIDOL (ISOVUE-370) INJECTION 76%
75.0000 mL | Freq: Once | INTRAVENOUS | Status: AC | PRN
  Administered 2021-07-07: 75 mL via INTRAVENOUS

## 2021-07-07 NOTE — Patient Instructions (Signed)
Discussed activity, lifestyle and nutrition management for aneurysmal/aortic disease.

## 2021-07-07 NOTE — Progress Notes (Signed)
[address]       [place] [address]             [phone]      [name] [place] Medical Record #[id number] Date of Birth: [birth year]  Referring: Hoyt Koch, * Primary Care: Hoyt Koch, MD Primary Cardiologist: Jenkins Rouge, MD   Chief Complaint:   POST OP FOLLOW UP DATE OF PROCEDURE:  02/20/2012 DATE OF DISCHARGE:                               OPERATIVE REPORT     OPERATION: 1. Repair of type A thoracic aortic dissection with replacement of the     ascending aorta and hemi-arch reconstruction of the aortic arch     using a 30 mm Dacron graft for the ascending aorta and a 32 mm     Dacron graft for the hemi-arch reconstruction. 2. Resuspension of the aortic valve. 3. Right axillary artery cannulation with continuous antegrade     cerebral perfusion during hypothermic circulatory arrest.   SURGEON:  Ivin Poot, M.D. History of Present Illness:    Patient is a 71 year old male we have been following at [facility] for many years following the above procedure.  He is seen on today's date in routine surveillance and CT scan has been reviewed.  He primarily complains of fatigue which has been a fairly chronic issue that he relates personally to metoprolol use.  He is not having significant chest pain, shortness of breath, palpitations or lower extremity edema.  He is tolerating routine exercise and ambulation.  He sometimes splits wood and does lots of chores around the house.      Past Medical History:  Diagnosis Date   AAA (abdominal aortic aneurysm)    ALLERGIC RHINITIS    Anemia    post op, treated /w Fe   Anxiety    Arthritis    OA, hands, - thumbs- specifically   Brachial plexopathy    double crush injury with median nerve damage on the right dominant hand.    COPD (chronic obstructive pulmonary disease) ([place])    also states he has been told that he has slight emphysema    Dissecting aortic aneurysm,  thoracic 02/20/2012   s/p emergent surgical repair   DM2 (diabetes mellitus, type 2) ([place])    DVT (deep venous thrombosis) ([facility])    coumadin- x3 months , post op   ERECTILE DYSFUNCTION, ORGANIC    GERD (gastroesophageal reflux disease)    related to diet & anxiety    HYPERLIPIDEMIA    Hypertension    pt. followwed by Dr. Johnsie Cancel   MIGRAINE HEADACHE    induced from physical , emotional stress , but sometimes occur randomly, also experiences BPPV   OBSTRUCTIVE SLEEP APNEA 02/2008 sleep study   moderate & central- not able to tolerate CPAP    Thyroid nodule 04/21/2015   2.3 cm on right, incidental finding on 03/2015 CT chest - f/u US ordered   Unspecified vitamin D deficiency      Social History   Tobacco Use  Smoking Status Former   Packs/day: 0.10   Types: Cigarettes   Quit date: 02/18/2012   Years since quitting: 9.3  Smokeless Tobacco Never  Tobacco Comments   10 cigs/day, quit 02/2012 after TAA emergent repair    Social History   Substance and Sexual Activity  Alcohol Use Yes   Alcohol/week: 0.0 standard drinks   Comment: shot of Tequila every day      Allergies  Allergen Reactions   Testosterone Shortness Of Breath    Medication:Androgel Pt states trouble breathing at night   Codeine Nausea And Vomiting   Dapagliflozin Other (See Comments)   Gabapentin     "drunk" feeling   Ultram [Tramadol] Nausea And Vomiting   Albuterol Other (See Comments)    Panic attack    Current Outpatient Medications  Medication Sig Dispense Refill   ALPRAZolam (XANAX) 1 MG tablet Take 0.5 tablets (0.5 mg total) by mouth 4 (four) times daily. 60 tablet 5   aspirin EC 81 MG tablet Take 81 mg by mouth daily with lunch.     azelastine (ASTELIN) 0.1 % nasal spray Place 2 sprays into both nostrils 2 (two) times daily. Office visit is due 30 mL 0   bisacodyl (DULCOLAX) 10 MG suppository Place 1 suppository (10 mg total) rectally daily as needed for moderate constipation. 12 suppository 0    calcium carbonate (TUMS EX) 750 MG chewable tablet Chew 1 tablet by mouth daily as needed for heartburn.      Cetirizine HCl (ZYRTEC PO) Take by mouth.     cholecalciferol (VITAMIN D) 1000 UNITS tablet Take 2,000 Units by mouth daily.     EPINEPHrine 0.3 mg/0.3 mL IJ SOAJ injection INJECT 0.3 MLS INTO THE MUSCLE ONCE AS DIRECTED 2 each 3   fexofenadine (ALLEGRA) 180 MG tablet Take 90 mg by mouth 2 (two) times daily.     fluticasone (FLONASE) 50 MCG/ACT nasal spray PLACE 2 SPRAYS INTO BOTH NOSTRILS DAILY. 16 g 2   hydrochlorothiazide (HYDRODIURIL) 25 MG tablet Take 1 tablet (25 mg total) by mouth daily. ANNUAL APPT DUE IN OCT MUST SEE PROVIDER FOR FUTURE REFILLS 90 tablet 1   metFORMIN (GLUCOPHAGE-XR) 500 MG 24 hr tablet Take 3 tablets (1,500 mg total) by mouth daily with breakfast. 270 tablet 3   metoprolol tartrate (LOPRESSOR) 25 MG tablet Take 1 tablet (25 mg total) by mouth 3 (three) times daily. 270 tablet 2   montelukast (SINGULAIR) 10 MG tablet Take 0.5 tablets (5 mg total) by mouth 2 (two) times daily. 90 tablet 1   Multiple Vitamin (MULTIVITAMIN WITH MINERALS) TABS Take 1 tablet by mouth daily.     mupirocin ointment (BACTROBAN) 2 % Apply topically 3 (three) times daily. 22 g 0   omega-3 acid ethyl esters (LOVAZA) 1 g capsule Take 1 capsule (1 g total) by mouth 2 (two) times daily. 180 capsule 3   OVER THE COUNTER MEDICATION Apply 1 application topically daily as needed (for acne). Oxy 10 vanishing cream     polyethylene glycol powder (GLYCOLAX/MIRALAX) powder MIX 17G WITH LIQUID AND DRINK BY MOUTH DAILY. 527 g 5   PREVIDENT 5000 DRY MOUTH 1.1 % GEL dental gel APPLY THIN RIBBON AND BRUSH FOR 2 MINUTES DAILY IN PLACE OF CONVENTIONAL TOOTHPASTE  99   rosuvastatin (CRESTOR) 10 MG tablet Take 1 tablet (10 mg total) by mouth daily. 90 tablet 3   sodium fluoride (PREVIDENT) 1.1 % GEL dental gel Apply thin ribbon/pea-sized amount to toothbrush. Brush teeth thoroughly, for at least 2 min. Use daily  in place of conventional toothpaste. 100 mL 3   thiamine (VITAMIN B-1) 100 MG tablet Take 100 mg by mouth 2 (two) times daily at 10 AM and 5 PM.      TURMERIC CURCUMIN PO Take by mouth.  vitamin B-12 (CYANOCOBALAMIN) 500 MCG tablet Take 500-1,000 mcg by mouth daily.      No current facility-administered medications for this visit.       Physical Exam: Ht 6\' 4"  (1.93 m)    BMI 27.63 kg/m   General appearance: alert, cooperative, and no distress Heart: regular rate and rhythm Lungs: clear to auscultation bilaterally   Diagnostic Studies & Laboratory data:     Recent Radiology Findings:   CT Angio Chest/Abd/Pel for Dissection W and/or W/WO  Result Date: 07/07/2021 CLINICAL DATA:  71 year old male with history of thoracic and abdominal aortic aneurysms that is post surgical repair. Follow-up study. EXAM: CT ANGIOGRAPHY CHEST, ABDOMEN AND PELVIS TECHNIQUE: Non-contrast CT of the chest was initially obtained. Multidetector CT imaging through the chest, abdomen and pelvis was performed using the standard protocol during bolus administration of intravenous contrast. Multiplanar reconstructed images and MIPs were obtained and reviewed to evaluate the vascular anatomy. CONTRAST:  78mL ISOVUE-370 IOPAMIDOL (ISOVUE-370) INJECTION 76% COMPARISON:  05/28/2020 FINDINGS: CTA CHEST FINDINGS VASCULAR Preferential opacification of the thoracic aorta. The heart is normal in size. No pericardial effusion. Sinues of Valsalva: 38 mm 36 x 36 mm ,unchanged. Similar appearing penetrating atheromatous ulcer about the lateral aspect of the non coronary cusp with patent ulceration measuring approximately 9.5 mm in depth and 1.3 mm in with, best visualized on coronal plane (series 14, image 64). Sinotubular Junction: 31 mm ,unchanged Ascending Aorta: Status post tube graft repair, no complicating features. Aortic Arch: 30 mm ,unchanged Descending aorta: Similar appearing chronic type 2 dissection with aneurysmal  degeneration of the descending thoracic aorta measuring up to 48 mm. The false lumen remains occluded throughout. Branch vessels: Conventional branching pattern. Scattered non flow limiting atherosclerotic changes. Coronary arteries: Normal origins and courses. Severe atherosclerotic calcifications. Main pulmonary artery: 30 mm ,unchanged. No evidence of central pulmonary embolism. Pulmonary veins: No anomalous pulmonary venous return. No evidence of left atrial appendage thrombus. Review of the MIP images confirms the above findings. NON VASCULAR Mediastinum/Nodes: No enlarged mediastinal, hilar, or axillary lymph nodes. Thyroid gland, trachea, and esophagus demonstrate no significant findings. Lungs/Pleura: Similar appearing severe upper lobe predominant paraseptal and centrilobular emphysema. No focal consolidations, pleural effusion, or pneumothorax. No suspicious pulmonary nodules. Musculoskeletal: No chest wall abnormality. No acute or significant osseous findings. CTA ABDOMEN AND PELVIS FINDINGS VASCULAR Aorta: Chronic type B aortic dissection extends to just below the level of the renal arteries. Similar appearing postsurgical changes after tube graft repair of infrarenal abdominal aortic aneurysm without complicating features. Celiac: Arises from the true lumen.  Patent throughout. SMA:  Arises from the true lumen, patent throughout. Renals: Single right renal artery arises from the true lumen. Single left renal artery arises from both the true and false lumen with similar appearing dissection flap extending into the ostium. IMA: Occluded proximally, distal reconstitution, unchanged. Inflow: Patent without evidence of aneurysm, dissection, vasculitis or significant stenosis. Scattered atherosclerotic calcifications Veins: No obvious venous abnormality within the limitations of this arterial phase study. Review of the MIP images confirms the above findings. NON-VASCULAR Hepatobiliary: The liver is normal in  size, contour, and attenuation. Again seen are multiple layering calcified gallstones in the nondistended gallbladder without wall thickening or pericholecystic fluid. No intra or extrahepatic biliary ductal dilation. Pancreas: Unremarkable. No pancreatic ductal dilatation or surrounding inflammatory changes. Spleen: Normal in size without focal abnormality. Adrenals/Urinary Tract: Adrenal glands are unremarkable. Kidneys are normal, without renal calculi, focal lesion, or hydronephrosis. Bladder is unremarkable. Stomach/Bowel: Stomach is within  normal limits. Appendix appears normal. Scattered colonic diverticula, most prominent about the sigmoid colon. No evidence of bowel wall thickening, distention, or inflammatory changes. Lymphatic: No abdominopelvic lymphadenopathy. Reproductive: Prostate is unremarkable. Other: No abdominal wall hernia or abnormality. No abdominopelvic ascites. Musculoskeletal: Multilevel degenerative changes of the lumbar spine, most pronounced at L3-L4 and L5-S1. No acute osseous abnormality. IMPRESSION: VASCULAR 1. Chronic type B thoracoabdominal aortic dissection with slight interval increased aneurysmal degeneration in the mid descending thoracic aorta which measures up to 4.8 cm, previously 4.6 cm. No evidence of dissection flap propagation. 2. Postsurgical changes after ascending thoracic aortic an infrarenal abdominal aortic tube graft repair without complicating features. 3. Similar appearing penetrating atheromatous ulceration about the non coronary cusp. 4. Coronary and aortic atherosclerosis (ICD10-I70.0). NON VASCULAR 1. Similar appearing emphysema (ICD10-J43.9). 2. Cholelithiasis. 3. Diverticulosis. Ruthann Cancer, MD [facility] Radiology Electronically Signed   By: Ruthann Cancer M.D.   On: 07/07/2021 14:58      Recent Lab Findings: Lab Results  Component Value Date   WBC 6.6 06/16/2021   HGB 15.3 06/16/2021   HCT 45.7  06/16/2021   PLT 156.0 06/16/2021   GLUCOSE 240 (H) 06/16/2021   CHOL 131 06/16/2021   TRIG 99.0 06/16/2021   HDL 38.70 (L) 06/16/2021   LDLDIRECT 114 (H) 03/21/2010   LDLCALC 73 06/16/2021   ALT 51 06/16/2021   AST 35 06/16/2021   NA 135 06/16/2021   K 4.1 06/16/2021   CL 98 06/16/2021   CREATININE 0.93 06/16/2021   BUN 23 06/16/2021   CO2 29 06/16/2021   TSH 1.17 06/16/2021   INR 1.0 03/20/2020   HGBA1C 7.8 (H) 06/16/2021      Assessment / Plan: The patient is stable overall clinically but the aneurysmal increase in his ascending portion is noted.  We will repeat a scan in 1 year and he will be continued to be followed long-term by his cardiology and other specialists/primary care.      Medication Changes: No orders of the defined types were placed in this encounter.     John Giovanni, PA-C  07/07/2021 3:01 PM

## 2021-07-09 ENCOUNTER — Other Ambulatory Visit (HOSPITAL_COMMUNITY): Payer: Self-pay

## 2021-07-09 MED ORDER — AMOXICILLIN 500 MG PO CAPS
2000.0000 mg | ORAL_CAPSULE | ORAL | 3 refills | Status: DC
  Filled 2021-07-09: qty 12, 3d supply, fill #0
  Filled 2021-12-31: qty 12, 3d supply, fill #1

## 2021-07-14 ENCOUNTER — Ambulatory Visit (INDEPENDENT_AMBULATORY_CARE_PROVIDER_SITE_OTHER): Payer: HMO | Admitting: Licensed Clinical Social Worker

## 2021-07-14 DIAGNOSIS — F332 Major depressive disorder, recurrent severe without psychotic features: Secondary | ICD-10-CM | POA: Diagnosis not present

## 2021-07-14 DIAGNOSIS — F419 Anxiety disorder, unspecified: Secondary | ICD-10-CM

## 2021-07-14 NOTE — Patient Instructions (Addendum)
Visit Information  Thank you for taking time to visit with me today. Please don't hesitate to contact me if I can be of assistance to you before our next scheduled telephone appointment.  Following are the goals we discussed today: Finding New Meaning and Purpose  Patient Self-Care Activities: Call [facility] to follow up on volunteer opportunities with [name] [phone] ext [phone]  Continue with therapy Continue with your self-care action plan (coffee shop, social outings and senior center engagement)  Our next appointment is by telephone on Feb. 21st at 11:30  Please call the care guide team at [phone] if you need to cancel or reschedule your appointment.   If you are experiencing a Mental Health or [place] or need someone to talk to, please call the [place] National Suicide Prevention Lifeline: 607-378-7497 or TTY: 802-013-0933 TTY [phone]) to talk to a trained counselor call 1-800-273-TALK (toll free, 24 hour hotline) go to [facility] [address] [place] [phone]) call 911   Patient verbalizes understanding of instructions and care plan provided today and agrees to view in [place]. Active MyChart status confirmed with patient.    [name], LCSW Licensed Clinical Social Worker [name]  [place]  [phone]

## 2021-07-14 NOTE — Chronic Care Management (AMB) (Signed)
Chronic Care Management   Clinical Social Work Note  07/14/2021 Name: [name] MRN: [id number] DOB: 03-21-1951  [name] is a 71 y.o. year old male who is a primary care patient of Hoyt Koch, MD. The CCM team was consulted to assist the patient with chronic disease management and/or care coordination needs related to:  Decrease Isolation and improve quality of life .   Engaged with patient by telephone for follow up visit in response to provider referral for social work chronic care management and care coordination services.   Consent to Services:  The patient was given information about Chronic Care Management services, agreed to services, and gave verbal consent prior to initiation of services.  Please see initial visit note for detailed documentation.  Patient agreed to services and consent obtained.   Summary: Assessed patient's current treatment, progress, coping skills, support system and barriers to care.  He continues to experience difficulty with finding his new normal after COVID and retirement. He has increased social outing and started engagement at the local senior center however continues missing meaning and purpose in his life..  See Care Plan below for interventions and patient self-care actives.  Recommendation: Patient may benefit from, and is in agreement to discuss his barriers of meaning and purpose with his therapist as well as explore additional volunteer opportunities with Tax adviser.   Follow up Plan: Patient would like continued follow-up from CCM LCSW.  per patient's request will follow up in 3 weeks.  Will call office if needed prior to next encounter.    Assessment: Review of patient past medical history, allergies, medications, and health status, including review of relevant consultants reports was performed today as part of a comprehensive evaluation and provision of chronic care management and care coordination  services.     SDOH (Social Determinants of Health) assessments and interventions performed:    Advanced Directives Status: Not addressed in this encounter.  CCM Care Plan  Conditions to be addressed/monitored: Anxiety and Depression; Limited social support and Social Isolation  Care Plan : LCSW Plan of Care  Updates made by Maurine Cane, LCSW since 07/14/2021 12:00 AM     Problem: Need for patient centered plan of care to promote improved quality of life   Priority: High     Long-Range Goal: Develop self management plan to improve quality of life   Start Date: 07/23/2020  Expected End Date: 09/10/2021  This Visit's Progress: On track  Recent Progress: On track  Priority: High  Note:    Current barriers:   Continued feelings of being alone/isolated "wanting to have meaningful intellectual conversations" Limited social/support Decline in quality of life related to retirement, COVID isolation, etc. Negative thoughts and feelings around self, relationships and isolation. Clinical Goal(s): patient will work with SW to reduce or manage symptoms related to feelings of decrease in quality of life and continue working with therapist to manage symptoms of depression.   Interventions: 1:1 collaboration with primary care provider regarding development and update of comprehensive plan of care as evidenced by provider attestation and co-signature Inter-disciplinary care team collaboration (see longitudinal plan of care) Evaluation of current treatment plan related to  self management and patient's adherence to plan as established by provider Review resources, discussed options and provided patient information about  Services provided by [employer]  Social Determinants of Health in Patient with  anxiety and depression :  (Status: Goal on Track (progressing): YES.) SDOH assessments  completed: Social Connections and Physical Activity Evaluation of current  treatment plan related to  managing symptoms of anxiety and depression  Solution-Focused Strategies employed:  Active listening / Reflection utilized  Problem Solving [place] strategies reviewed  Patient Self-Care Activities: Call [facility] [place] to follow up on volunteer opportunities with [name] [phone] ext [phone]  Continue with therapy Continue with your self-care action plan (coffee shop, social outs and senior center engagement)      [name], [place] Licensed Clinical Social Worker [name]  [place]  [phone]

## 2021-07-21 ENCOUNTER — Telehealth: Payer: HMO

## 2021-07-23 ENCOUNTER — Other Ambulatory Visit (HOSPITAL_COMMUNITY): Payer: Self-pay

## 2021-07-27 ENCOUNTER — Other Ambulatory Visit (HOSPITAL_COMMUNITY): Payer: Self-pay

## 2021-07-27 ENCOUNTER — Other Ambulatory Visit: Payer: Self-pay | Admitting: Internal Medicine

## 2021-07-28 ENCOUNTER — Other Ambulatory Visit (HOSPITAL_COMMUNITY): Payer: Self-pay

## 2021-07-28 MED ORDER — FLUTICASONE PROPIONATE 50 MCG/ACT NA SUSP
2.0000 | Freq: Every day | NASAL | 2 refills | Status: DC
  Filled 2021-07-28: qty 16, 30d supply, fill #0
  Filled 2021-09-14: qty 16, 30d supply, fill #1
  Filled 2021-11-16: qty 16, 30d supply, fill #2

## 2021-08-04 ENCOUNTER — Ambulatory Visit: Payer: HMO | Admitting: Licensed Clinical Social Worker

## 2021-08-04 DIAGNOSIS — F419 Anxiety disorder, unspecified: Secondary | ICD-10-CM

## 2021-08-04 DIAGNOSIS — F411 Generalized anxiety disorder: Secondary | ICD-10-CM | POA: Diagnosis not present

## 2021-08-04 DIAGNOSIS — F332 Major depressive disorder, recurrent severe without psychotic features: Secondary | ICD-10-CM

## 2021-08-04 NOTE — Patient Instructions (Signed)
Visit Information  Thank you for taking time to visit with me today. Please don't hesitate to contact me if I can be of assistance to you before our next scheduled telephone appointment.  Following are the goals we discussed today: Social Research officer, political party Activities: [facility] [place] to follow up on volunteer opportunities with [name] [phone] ext [phone]  Continue with therapy Continue with your self-care action plan (coffee shop, social outs and senior center engagement) Our next appointment is by telephone on April 25th  at 11:30  Please call the care guide team at [phone] if you need to cancel or reschedule your appointment.   If you are experiencing a Mental Health or [place] or need someone to talk to, please call 1-800-273-TALK (toll free, 24 hour hotline)   Patient verbalizes understanding of instructions and care plan provided today and agrees to view in [place]. Active MyChart status confirmed with patient.    [name], LCSW Licensed Clinical Social Worker [name] [employer]  [place]  [phone]

## 2021-08-04 NOTE — Chronic Care Management (AMB) (Cosign Needed)
Chronic Care Management   Clinical Social Work Note  08/04/2021 Name: [name] MRN: [id number] DOB: March 17, 1951  [name] is a 71 y.o. year old male who is a primary care patient of Ryan Salina Real Cons, MD. The CCM team was consulted to assist the patient with chronic disease management and/or care coordination needs related to:  social support .   Engaged with patient by telephone for follow up visit in response to provider referral for social work chronic care management and care coordination services.   Consent to Services:  The patient was given information about Chronic Care Management services, agreed to services, and gave verbal consent prior to initiation of services.  Please see initial visit note for detailed documentation.   Patient agreed to services and consent obtained.  Summary: Assessed patient's current treatment, progress, coping skills, support system and barriers to care.  He is making progress with and continues to talk to his therapist , but continues to have difficulty with consistent social connection.  Is making progress with going out to events and exploring community options . See Care Plan below for interventions and patient self-care actives.  Recommendation: Patient may benefit from, and is in agreement to continue with social events and developing his support system via volunteer activities and connecting with family..   Follow up Plan: Patient would like continued follow-up from CCM LCSW.  per patient's request will follow up in 60 days.  Will call office if needed prior to next encounter.   Assessment: Review of patient past medical history, allergies, medications, and health status, including review of relevant consultants reports was performed today as part of a comprehensive evaluation and provision of chronic care management and care coordination services.     SDOH (Social Determinants of Health) assessments and interventions performed:     Advanced Directives Status: Not addressed in this encounter.  CCM Care Plan  Conditions to be addressed/monitored: Anxiety; Limited social support  Care Plan : LCSW Plan of Care  Updates made by Maurine Cane, LCSW since 08/04/2021 12:00 AM     Problem: promote improved quality of life   Priority: High     Long-Range Goal: Develop self management plan to improve quality of life   Start Date: 07/23/2020  Expected End Date: 11/10/2021  This Visit's Progress: On track  Recent Progress: On track  Priority: High  Note:    Current barriers:   Continued feelings of being alone/isolated "wanting to have meaningful intellectual conversations" Limited social/support; Decline in quality of life related to retirement, COVID isolation, etc.  Clinical Goal(s): patient will work with SW to reduce or manage symptoms related to feelings of decrease in quality of life and continue working with therapist to manage symptoms of depression.   Interventions: 1:1 collaboration with primary care provider regarding development and update of comprehensive plan of care as evidenced by provider attestation and co-signature Inter-disciplinary care team collaboration (see longitudinal plan of care) Evaluation of current treatment plan related to  self management and patient's adherence to plan as established by provider  Social Determinants of Health in Patient with  anxiety and depression :  (Status: Goal on Track (progressing): YES.) SDOH assessments completed: Social Connections and Physical Activity Evaluation of current treatment plan related to  managing symptoms of anxiety and depression  Solution-Focused Strategies employed:  Active listening / Reflection utilized  Problem Solving [place] reviewed Services provided by [employer]  Self-Care Activities: [facility]  [place] to follow up on volunteer opportunities with [name]  [phone] ext [phone]  Continue with therapy Continue with your self-care action plan (coffee shop, social outs and senior center engagement)     [name], [place] Licensed Clinical Social Worker [name] [employer]  [place]  [phone]

## 2021-08-24 ENCOUNTER — Other Ambulatory Visit (HOSPITAL_COMMUNITY): Payer: Self-pay

## 2021-09-07 ENCOUNTER — Other Ambulatory Visit (HOSPITAL_COMMUNITY): Payer: Self-pay

## 2021-09-07 DIAGNOSIS — I509 Heart failure, unspecified: Secondary | ICD-10-CM | POA: Diagnosis not present

## 2021-09-07 DIAGNOSIS — Z6828 Body mass index (BMI) 28.0-28.9, adult: Secondary | ICD-10-CM | POA: Diagnosis not present

## 2021-09-07 DIAGNOSIS — J449 Chronic obstructive pulmonary disease, unspecified: Secondary | ICD-10-CM | POA: Diagnosis not present

## 2021-09-07 DIAGNOSIS — E119 Type 2 diabetes mellitus without complications: Secondary | ICD-10-CM | POA: Diagnosis not present

## 2021-09-07 DIAGNOSIS — Z87891 Personal history of nicotine dependence: Secondary | ICD-10-CM | POA: Diagnosis not present

## 2021-09-07 DIAGNOSIS — Z7984 Long term (current) use of oral hypoglycemic drugs: Secondary | ICD-10-CM | POA: Diagnosis not present

## 2021-09-07 DIAGNOSIS — Z7951 Long term (current) use of inhaled steroids: Secondary | ICD-10-CM | POA: Diagnosis not present

## 2021-09-14 ENCOUNTER — Other Ambulatory Visit (HOSPITAL_COMMUNITY): Payer: Self-pay

## 2021-09-15 DIAGNOSIS — F411 Generalized anxiety disorder: Secondary | ICD-10-CM | POA: Diagnosis not present

## 2021-09-16 ENCOUNTER — Other Ambulatory Visit (HOSPITAL_COMMUNITY): Payer: Self-pay

## 2021-09-17 ENCOUNTER — Encounter: Payer: Self-pay | Admitting: Internal Medicine

## 2021-09-28 ENCOUNTER — Other Ambulatory Visit (HOSPITAL_COMMUNITY): Payer: Self-pay

## 2021-09-29 ENCOUNTER — Other Ambulatory Visit (HOSPITAL_COMMUNITY): Payer: Self-pay

## 2021-10-01 ENCOUNTER — Telehealth: Payer: Self-pay | Admitting: *Deleted

## 2021-10-01 NOTE — Chronic Care Management (AMB) (Signed)
?  Care Management  ? ?Note ? ?10/01/2021 ?Name: [name] MRN: [id number] DOB: [birth year] ? ?[name] is a 71 y.o. year old male who is a primary care patient of Hoyt Koch, MD and is actively engaged with the care management team. I reached out to [name] by phone today to assist with re-scheduling a follow up visit with the Licensed Clinical Social Worker ? ?Follow up plan: ?Patient declines further follow up and engagement by the care management team. Appropriate care team members and provider have been notified via electronic communication.The care management team is available to follow up with the patient after provider conversation with the patient regarding recommendation for care management engagement and subsequent re-referral to the care management team.  ? ?Laverda Sorenson  ?Care Guide, Embedded Care Coordination ?[facility]  [facility] Management  ?Direct Dial: [phone] ? ?

## 2021-10-02 ENCOUNTER — Other Ambulatory Visit (HOSPITAL_COMMUNITY): Payer: Self-pay

## 2021-10-02 MED ORDER — PREDNISONE 20 MG PO TABS
40.0000 mg | ORAL_TABLET | Freq: Every day | ORAL | 0 refills | Status: DC
  Filled 2021-10-02: qty 10, 5d supply, fill #0

## 2021-10-05 ENCOUNTER — Other Ambulatory Visit (HOSPITAL_COMMUNITY): Payer: Self-pay

## 2021-10-06 ENCOUNTER — Telehealth: Payer: HMO

## 2021-10-15 ENCOUNTER — Ambulatory Visit (INDEPENDENT_AMBULATORY_CARE_PROVIDER_SITE_OTHER): Payer: HMO | Admitting: Podiatry

## 2021-10-15 DIAGNOSIS — L84 Corns and callosities: Secondary | ICD-10-CM | POA: Diagnosis not present

## 2021-10-15 DIAGNOSIS — E1100 Type 2 diabetes mellitus with hyperosmolarity without nonketotic hyperglycemic-hyperosmolar coma (NKHHC): Secondary | ICD-10-CM | POA: Diagnosis not present

## 2021-10-20 ENCOUNTER — Encounter: Payer: Self-pay | Admitting: Podiatry

## 2021-10-20 NOTE — Progress Notes (Signed)
?  Subjective:  ?Patient ID: [name], male    DOB: [birth year],  MRN: [id number] ? ?Chief Complaint  ?Patient presents with  ? Nail Problem  ?   L great toe nail came off bleeding  ? ? ?71 y.o. male presents with the above complaint. History confirmed with patient.  Doing well the toenails are regrowing, the abrasion on the toe had healed he has some painful skin lesion on the left foot ? ?Objective:  ?Physical Exam: ?warm, good capillary refill, normal DP and PT pulses, normal sensory exam, and previous nail injury is resolving with new nail growing in well there is a submetatarsal 2 hyperkeratotic lesion ?Assessment:  ? ?1. Callus of foot   ?2. Type 2 diabetes mellitus with hyperosmolarity without coma, without long-term current use of insulin ([place])   ? ? ? ?Plan:  ?Patient was evaluated and treated and all questions answered. ? ?Toenail seem to be growing and allow them to continue to grow. ? ?All symptomatic hyperkeratoses were safely debrided with a sterile #15 blade to patient's level of comfort without incident. We discussed preventative and palliative care of these lesions including supportive and accommodative shoegear, padding, prefabricated and custom molded accommodative orthoses, use of a pumice stone and lotions/creams daily. ? ? ? ?Return in about 6 months (around 04/17/2022) for re-evaluate toenails that were lost.  ? ? ?

## 2021-10-29 ENCOUNTER — Other Ambulatory Visit (HOSPITAL_COMMUNITY): Payer: Self-pay

## 2021-11-03 DIAGNOSIS — F411 Generalized anxiety disorder: Secondary | ICD-10-CM | POA: Diagnosis not present

## 2021-11-12 ENCOUNTER — Other Ambulatory Visit (HOSPITAL_COMMUNITY): Payer: Self-pay

## 2021-11-12 MED ORDER — AMOXICILLIN 500 MG PO CAPS
500.0000 mg | ORAL_CAPSULE | Freq: Three times a day (TID) | ORAL | 0 refills | Status: DC
  Filled 2021-11-12: qty 21, 7d supply, fill #0

## 2021-11-16 ENCOUNTER — Other Ambulatory Visit (HOSPITAL_COMMUNITY): Payer: Self-pay

## 2021-11-23 ENCOUNTER — Other Ambulatory Visit: Payer: Self-pay | Admitting: Internal Medicine

## 2021-11-23 ENCOUNTER — Other Ambulatory Visit: Payer: Self-pay | Admitting: Cardiovascular Disease

## 2021-11-23 ENCOUNTER — Other Ambulatory Visit (HOSPITAL_COMMUNITY): Payer: Self-pay

## 2021-11-23 MED ORDER — NYSTATIN 100000 UNIT/ML MT SUSP
5.0000 mL | Freq: Two times a day (BID) | OROMUCOSAL | 0 refills | Status: DC
  Filled 2021-11-23: qty 140, 14d supply, fill #0

## 2021-11-24 ENCOUNTER — Other Ambulatory Visit (HOSPITAL_COMMUNITY): Payer: Self-pay

## 2021-11-24 MED ORDER — METOPROLOL TARTRATE 25 MG PO TABS
25.0000 mg | ORAL_TABLET | Freq: Three times a day (TID) | ORAL | 2 refills | Status: DC
Start: 2021-11-24 — End: 2022-08-09
  Filled 2021-11-24: qty 270, 90d supply, fill #0
  Filled 2022-02-24: qty 270, 90d supply, fill #1
  Filled 2022-05-24: qty 270, 90d supply, fill #2

## 2021-11-24 MED ORDER — ALPRAZOLAM 1 MG PO TABS
0.5000 mg | ORAL_TABLET | Freq: Four times a day (QID) | ORAL | 5 refills | Status: DC
  Filled 2021-11-24 – 2021-11-30 (×2): qty 60, 30d supply, fill #0
  Filled 2021-12-31: qty 60, 30d supply, fill #1
  Filled 2022-02-04: qty 60, 30d supply, fill #2
  Filled 2022-03-10: qty 60, 30d supply, fill #3
  Filled 2022-04-12: qty 60, 30d supply, fill #4
  Filled 2022-05-17: qty 60, 30d supply, fill #5

## 2021-11-30 ENCOUNTER — Other Ambulatory Visit (HOSPITAL_COMMUNITY): Payer: Self-pay

## 2021-11-30 ENCOUNTER — Other Ambulatory Visit: Payer: Self-pay | Admitting: Internal Medicine

## 2021-12-01 ENCOUNTER — Other Ambulatory Visit (HOSPITAL_COMMUNITY): Payer: Self-pay

## 2021-12-01 MED ORDER — HYDROCHLOROTHIAZIDE 25 MG PO TABS
25.0000 mg | ORAL_TABLET | Freq: Every day | ORAL | 1 refills | Status: DC
  Filled 2021-12-01: qty 90, 90d supply, fill #0
  Filled 2022-02-24: qty 90, 90d supply, fill #1

## 2021-12-01 MED ORDER — MONTELUKAST SODIUM 10 MG PO TABS
5.0000 mg | ORAL_TABLET | Freq: Two times a day (BID) | ORAL | 1 refills | Status: DC
Start: 2021-12-01 — End: 2022-05-31
  Filled 2021-12-01: qty 90, 90d supply, fill #0
  Filled 2022-02-24: qty 90, 90d supply, fill #1

## 2021-12-02 DIAGNOSIS — F411 Generalized anxiety disorder: Secondary | ICD-10-CM | POA: Diagnosis not present

## 2021-12-11 ENCOUNTER — Other Ambulatory Visit: Payer: Self-pay | Admitting: Internal Medicine

## 2021-12-11 ENCOUNTER — Other Ambulatory Visit (HOSPITAL_COMMUNITY): Payer: Self-pay

## 2021-12-11 MED ORDER — FLUTICASONE PROPIONATE 50 MCG/ACT NA SUSP
2.0000 | Freq: Every day | NASAL | 2 refills | Status: DC
Start: 2021-12-11 — End: 2022-03-10
  Filled 2021-12-11: qty 16, 30d supply, fill #0
  Filled 2022-01-13: qty 16, 30d supply, fill #1
  Filled 2022-02-11: qty 16, 30d supply, fill #2

## 2021-12-14 ENCOUNTER — Other Ambulatory Visit (HOSPITAL_COMMUNITY): Payer: Self-pay

## 2021-12-16 ENCOUNTER — Other Ambulatory Visit: Payer: Self-pay | Admitting: Internal Medicine

## 2021-12-16 ENCOUNTER — Other Ambulatory Visit (HOSPITAL_COMMUNITY): Payer: Self-pay

## 2021-12-16 MED ORDER — EPINEPHRINE 0.3 MG/0.3ML IJ SOAJ
INTRAMUSCULAR | 3 refills | Status: DC
  Filled 2021-12-16: qty 2, 20d supply, fill #0
  Filled 2022-04-20: qty 2, 20d supply, fill #1
  Filled 2022-12-06: qty 2, 20d supply, fill #2

## 2021-12-27 DIAGNOSIS — H6121 Impacted cerumen, right ear: Secondary | ICD-10-CM | POA: Diagnosis not present

## 2021-12-30 DIAGNOSIS — H6121 Impacted cerumen, right ear: Secondary | ICD-10-CM | POA: Diagnosis not present

## 2021-12-31 ENCOUNTER — Other Ambulatory Visit (HOSPITAL_COMMUNITY): Payer: Self-pay

## 2021-12-31 DIAGNOSIS — F411 Generalized anxiety disorder: Secondary | ICD-10-CM | POA: Diagnosis not present

## 2022-01-01 ENCOUNTER — Other Ambulatory Visit (HOSPITAL_COMMUNITY): Payer: Self-pay

## 2022-01-03 NOTE — Progress Notes (Signed)
Subjective:    Patient ID: [name], male    DOB: Jul 13, 1950, 71 y.o.   MRN: [id number]      HPI [name] is here for  Chief Complaint  Patient presents with   Cerumen Impaction    Right ear wax   Had an EMT come to his house for a visit and he was found to have earwax obstruction in the right ear canal-she did clean it out, but had to come back the next week to finish getting wax out.  He was told he still had wax which is part of the reason why he is here today.  He does state some decreased hearing, feeling of water in the ear.  He may have had discharge from the ear at 1 point.  He denies any ear pain.   He has been doing debrox overnight - started alternating peroxide in the ear.     He is 2 small bumps near each wrist and he wanted to know if that was normal or not.   Medications and allergies reviewed with patient and updated if appropriate.  Current Outpatient Medications on File Prior to Visit  Medication Sig Dispense Refill   ALPRAZolam (XANAX) 1 MG tablet Take 0.5 tablets (0.5 mg total) by mouth 4 (four) times daily. 60 tablet 5   amoxicillin (AMOXIL) 500 MG capsule Take 4 capsules (2,000 mg total) by mouth 1 hour before dental procedure. Save remaining capsules for future appointments 12 capsule 3   amoxicillin (AMOXIL) 500 MG capsule Take 1 capsule (500 mg total) by mouth every 8 (eight) hours until gone 21 capsule 0   aspirin EC 81 MG tablet Take 81 mg by mouth daily with lunch.     azelastine (ASTELIN) 0.1 % nasal spray Place 2 sprays into both nostrils 2 (two) times daily. Office visit is due 30 mL 0   bisacodyl (DULCOLAX) 10 MG suppository Place 1 suppository (10 mg total) rectally daily as needed for moderate constipation. 12 suppository 0   calcium carbonate (TUMS EX) 750 MG chewable tablet Chew 1 tablet by mouth daily as needed for heartburn.      Cetirizine HCl (ZYRTEC PO) Take by mouth.     cholecalciferol (VITAMIN D) 1000 UNITS tablet Take 2,000  Units by mouth daily.     EPINEPHrine 0.3 mg/0.3 mL IJ SOAJ injection INJECT 0.3 MLS INTO THE MUSCLE ONCE AS DIRECTED 2 each 3   fexofenadine (ALLEGRA) 180 MG tablet Take 90 mg by mouth 2 (two) times daily.     fluticasone (FLONASE) 50 MCG/ACT nasal spray Place 2 sprays into both nostrils daily. 16 g 2   hydrochlorothiazide (HYDRODIURIL) 25 MG tablet Take 1 tablet (25 mg total) by mouth daily. ANNUAL APPT DUE IN OCT MUST SEE PROVIDER FOR FUTURE REFILLS 90 tablet 1   magic mouthwash (nystatin, hydrocortisone, diphenhydrAMINE) suspension Swish with 13m's by mouth then spit out 2 times a day 140 mL 0   metFORMIN (GLUCOPHAGE-XR) 500 MG 24 hr tablet Take 3 tablets (1,500 mg total) by mouth daily with breakfast. 270 tablet 3   metoprolol tartrate (LOPRESSOR) 25 MG tablet Take 1 tablet (25 mg total) by mouth 3 (three) times daily. 270 tablet 2   montelukast (SINGULAIR) 10 MG tablet Take 0.5 tablets (5 mg total) by mouth 2 (two) times daily. 90 tablet 1   Multiple Vitamin (MULTIVITAMIN WITH MINERALS) TABS Take 1 tablet by mouth daily.     mupirocin ointment (BACTROBAN) 2 % Apply topically  3 (three) times daily. 22 g 0   omega-3 acid ethyl esters (LOVAZA) 1 g capsule Take 1 capsule (1 g total) by mouth 2 (two) times daily. 180 capsule 3   OVER THE COUNTER MEDICATION Apply 1 application topically daily as needed (for acne). Oxy 10 vanishing cream     polyethylene glycol powder (GLYCOLAX/MIRALAX) powder MIX 17G WITH LIQUID AND DRINK BY MOUTH DAILY. 527 g 5   PREVIDENT 5000 DRY MOUTH 1.1 % GEL dental gel APPLY THIN RIBBON AND BRUSH FOR 2 MINUTES DAILY IN PLACE OF CONVENTIONAL TOOTHPASTE  99   rosuvastatin (CRESTOR) 10 MG tablet Take 1 tablet (10 mg total) by mouth daily. 90 tablet 3   sodium fluoride (PREVIDENT) 1.1 % GEL dental gel Apply thin ribbon/pea-sized amount to toothbrush. Brush teeth thoroughly, for at least 2 min. Use daily in place of conventional toothpaste. 100 mL 3   thiamine (VITAMIN B-1) 100 MG  tablet Take 100 mg by mouth 2 (two) times daily at 10 AM and 5 PM.      TURMERIC CURCUMIN PO Take by mouth.     vitamin B-12 (CYANOCOBALAMIN) 500 MCG tablet Take 500-1,000 mcg by mouth daily.      [DISCONTINUED] oxyCODONE (OXY IR/ROXICODONE) 5 MG immediate release tablet Take 1 tablet (5 mg total) by mouth every 4 (four) hours as needed. 30 tablet 0   No current facility-administered medications on file prior to visit.    Review of Systems  Constitutional:  Negative for chills and fever.  HENT:  Positive for ear discharge (? maybe recently) and hearing loss (right). Negative for congestion, ear pain, sinus pain, sore throat and tinnitus.        Right  - Water in the ear sensation  Neurological:  Negative for dizziness and headaches.       Objective:   Vitals:   01/04/22 1429  BP: 124/64  Pulse: 60  Temp: 98.3 F (36.8 C)  SpO2: 97%   BP Readings from Last 3 Encounters:  01/04/22 124/64  07/07/21 (!) 149/74  06/30/21 126/74   Wt Readings from Last 3 Encounters:  01/04/22 225 lb (102.1 kg)  07/07/21 225 lb 9.6 oz (102.3 kg)  06/30/21 227 lb (103 kg)   Body mass index is 27.39 kg/m.    Physical Exam Constitutional:      General: He is not in acute distress.    Appearance: Normal appearance.  HENT:     Head: Normocephalic and atraumatic.     Right Ear: External ear normal. There is no impacted cerumen (No wax present).     Left Ear: Tympanic membrane, ear canal and external ear normal. There is no impacted cerumen (Scant wax).     Ears:     Comments: Right ear canal with spots of mild erythema - not generalized, area of erythema and TM-not generalized Skin:    General: Skin is warm and dry.     Findings: No rash.     Comments: Ganglion cyst anterior left wrist and right wrist  Neurological:     Mental Status: He is alert.            Assessment & Plan:    Ear canal abrasion, right: Acute Came in with concerns of excessive earwax in right ear with  feeling of water in the ear, decreased hearing No evidence of earwax or infection Evidence of slight abrasion ear canal likely from earwax removal done at home Advised no treatment at home-stop Debrox and hydrogen peroxide  Expect symptoms to improve  Ganglion cyst-bilateral wrist: Skin lumps likely bilateral Ganglion cyst Both small and nontender No treatment needed at this time Advised to monitor

## 2022-01-04 ENCOUNTER — Encounter: Payer: Self-pay | Admitting: Internal Medicine

## 2022-01-04 ENCOUNTER — Ambulatory Visit (INDEPENDENT_AMBULATORY_CARE_PROVIDER_SITE_OTHER): Payer: PPO | Admitting: Internal Medicine

## 2022-01-04 DIAGNOSIS — M67432 Ganglion, left wrist: Secondary | ICD-10-CM

## 2022-01-04 DIAGNOSIS — M67431 Ganglion, right wrist: Secondary | ICD-10-CM

## 2022-01-04 DIAGNOSIS — S00411A Abrasion of right ear, initial encounter: Secondary | ICD-10-CM

## 2022-01-04 DIAGNOSIS — S00419A Abrasion of unspecified ear, initial encounter: Secondary | ICD-10-CM | POA: Insufficient documentation

## 2022-01-13 ENCOUNTER — Other Ambulatory Visit (HOSPITAL_COMMUNITY): Payer: Self-pay

## 2022-01-15 ENCOUNTER — Other Ambulatory Visit (HOSPITAL_COMMUNITY): Payer: Self-pay

## 2022-01-15 DIAGNOSIS — Z8669 Personal history of other diseases of the nervous system and sense organs: Secondary | ICD-10-CM | POA: Diagnosis not present

## 2022-01-15 DIAGNOSIS — Z09 Encounter for follow-up examination after completed treatment for conditions other than malignant neoplasm: Secondary | ICD-10-CM | POA: Diagnosis not present

## 2022-01-25 ENCOUNTER — Other Ambulatory Visit (HOSPITAL_COMMUNITY): Payer: Self-pay

## 2022-01-28 DIAGNOSIS — L821 Other seborrheic keratosis: Secondary | ICD-10-CM | POA: Diagnosis not present

## 2022-01-28 DIAGNOSIS — D225 Melanocytic nevi of trunk: Secondary | ICD-10-CM | POA: Diagnosis not present

## 2022-01-28 DIAGNOSIS — D1801 Hemangioma of skin and subcutaneous tissue: Secondary | ICD-10-CM | POA: Diagnosis not present

## 2022-01-28 DIAGNOSIS — Z85828 Personal history of other malignant neoplasm of skin: Secondary | ICD-10-CM | POA: Diagnosis not present

## 2022-01-28 DIAGNOSIS — L570 Actinic keratosis: Secondary | ICD-10-CM | POA: Diagnosis not present

## 2022-01-28 DIAGNOSIS — L820 Inflamed seborrheic keratosis: Secondary | ICD-10-CM | POA: Diagnosis not present

## 2022-02-01 ENCOUNTER — Encounter (INDEPENDENT_AMBULATORY_CARE_PROVIDER_SITE_OTHER): Payer: Self-pay | Admitting: Ophthalmology

## 2022-02-01 ENCOUNTER — Ambulatory Visit (INDEPENDENT_AMBULATORY_CARE_PROVIDER_SITE_OTHER): Payer: HMO | Admitting: Ophthalmology

## 2022-02-01 DIAGNOSIS — H5319 Other subjective visual disturbances: Secondary | ICD-10-CM

## 2022-02-01 DIAGNOSIS — H43811 Vitreous degeneration, right eye: Secondary | ICD-10-CM | POA: Diagnosis not present

## 2022-02-01 DIAGNOSIS — H35033 Hypertensive retinopathy, bilateral: Secondary | ICD-10-CM

## 2022-02-01 DIAGNOSIS — E119 Type 2 diabetes mellitus without complications: Secondary | ICD-10-CM | POA: Diagnosis not present

## 2022-02-01 DIAGNOSIS — H25813 Combined forms of age-related cataract, bilateral: Secondary | ICD-10-CM

## 2022-02-01 DIAGNOSIS — I10 Essential (primary) hypertension: Secondary | ICD-10-CM | POA: Diagnosis not present

## 2022-02-01 NOTE — Progress Notes (Signed)
[facility] Note  02/01/2022     CHIEF COMPLAINT Patient presents for Retina Evaluation   HISTORY OF PRESENT ILLNESS: [name] is a 71 y.o. male who presents to the clinic today for:   HPI     Retina Evaluation   In right eye.  Associated Symptoms Flashes and Floaters.  Negative for Distortion, Blind Spot, Pain, Redness, Photophobia, Glare, Trauma, Scalp Tenderness, Jaw Claudication, Shoulder/Hip pain, Fever, Weight Loss and Fatigue.  Context:  distance vision, mid-range vision and near vision.  I, the attending physician,  performed the HPI with the patient and updated documentation appropriately.        Comments   Pt started seeing fol OD last night, "like fireworks", he states occasionally he will see a floater as well, no eye pain, he states flashes occur when he looks side to side, not up and down, he states prior to the fol, he had a "pinching" in his chest, no eye sx      Last edited by Bernarda Caffey, MD on 02/01/2022 10:13 AM.     Patient states started seeing flashes OD last night. Occasional floater OD. Flashing continued some this am OD, especially when patient is looking side to side, not up and down. Denies curtain/veil over vision. Patient is on metformin for diabetes. Last a1c was 7.8, checked on 01.03.23.   Referring physician: [facility] [address] [place],  [place] [address]  HISTORICAL INFORMATION:   Selected notes from the MEDICAL RECORD NUMBER Pt of Dr. Katy Fitch -- call LEE:  Ocular Hx- PMH-    CURRENT MEDICATIONS: No current outpatient medications on file. (Ophthalmic Drugs)   No current facility-administered medications for this visit. (Ophthalmic Drugs)   Current Outpatient Medications (Other)  Medication Sig   ALPRAZolam (XANAX) 1 MG tablet Take 0.5 tablets (0.5 mg total) by mouth 4 (four) times daily.   amoxicillin (AMOXIL) 500 MG capsule Take 4 capsules (2,000 mg total) by mouth 1  hour before dental procedure. Save remaining capsules for future appointments   amoxicillin (AMOXIL) 500 MG capsule Take 1 capsule (500 mg total) by mouth every 8 (eight) hours until gone   aspirin EC 81 MG tablet Take 81 mg by mouth daily with lunch.   azelastine (ASTELIN) 0.1 % nasal spray Place 2 sprays into both nostrils 2 (two) times daily. Office visit is due   bisacodyl (DULCOLAX) 10 MG suppository Place 1 suppository (10 mg total) rectally daily as needed for moderate constipation.   calcium carbonate (TUMS EX) 750 MG chewable tablet Chew 1 tablet by mouth daily as needed for heartburn.    Cetirizine HCl (ZYRTEC PO) Take by mouth.   cholecalciferol (VITAMIN D) 1000 UNITS tablet Take 2,000 Units by mouth daily.   EPINEPHrine 0.3 mg/0.3 mL IJ SOAJ injection INJECT 0.3 MLS INTO THE MUSCLE ONCE AS DIRECTED   fexofenadine (ALLEGRA) 180 MG tablet Take 90 mg by mouth 2 (two) times daily.   fluticasone (FLONASE) 50 MCG/ACT nasal spray Place 2 sprays into both nostrils daily.   hydrochlorothiazide (HYDRODIURIL) 25 MG tablet Take 1 tablet (25 mg total) by mouth daily. ANNUAL APPT DUE IN OCT MUST SEE PROVIDER FOR FUTURE REFILLS   magic mouthwash (nystatin, hydrocortisone, diphenhydrAMINE) suspension Swish with 45m's by mouth then spit out 2 times a day   metFORMIN (GLUCOPHAGE-XR) 500 MG 24 hr tablet Take 3 tablets (1,500 mg total) by mouth daily with breakfast.   metoprolol tartrate (  LOPRESSOR) 25 MG tablet Take 1 tablet (25 mg total) by mouth 3 (three) times daily.   montelukast (SINGULAIR) 10 MG tablet Take 0.5 tablets (5 mg total) by mouth 2 (two) times daily.   Multiple Vitamin (MULTIVITAMIN WITH MINERALS) TABS Take 1 tablet by mouth daily.   mupirocin ointment (BACTROBAN) 2 % Apply topically 3 (three) times daily.   omega-3 acid ethyl esters (LOVAZA) 1 g capsule Take 1 capsule (1 g total) by mouth 2 (two) times daily.   OVER THE COUNTER MEDICATION Apply 1 application topically daily as needed  (for acne). Oxy 10 vanishing cream   polyethylene glycol powder (GLYCOLAX/MIRALAX) powder MIX 17G WITH LIQUID AND DRINK BY MOUTH DAILY.   PREVIDENT 5000 DRY MOUTH 1.1 % GEL dental gel APPLY THIN RIBBON AND BRUSH FOR 2 MINUTES DAILY IN PLACE OF CONVENTIONAL TOOTHPASTE   rosuvastatin (CRESTOR) 10 MG tablet Take 1 tablet (10 mg total) by mouth daily.   sodium fluoride (PREVIDENT) 1.1 % GEL dental gel Apply thin ribbon/pea-sized amount to toothbrush. Brush teeth thoroughly, for at least 2 min. Use daily in place of conventional toothpaste.   thiamine (VITAMIN B-1) 100 MG tablet Take 100 mg by mouth 2 (two) times daily at 10 AM and 5 PM.    TURMERIC CURCUMIN PO Take by mouth.   vitamin B-12 (CYANOCOBALAMIN) 500 MCG tablet Take 500-1,000 mcg by mouth daily.    No current facility-administered medications for this visit. (Other)   REVIEW OF SYSTEMS: ROS   Positive for: Endocrine, Cardiovascular, Eyes Negative for: Constitutional, Gastrointestinal, Neurological, Skin, Genitourinary, Musculoskeletal, HENT, Respiratory, Psychiatric, Allergic/Imm, Heme/Lymph Last edited by Debbrah Alar, COT on 02/01/2022  9:58 AM.     ALLERGIES Allergies  Allergen Reactions   Testosterone Shortness Of Breath    Medication:Androgel Pt states trouble breathing at night   Codeine Nausea And Vomiting   Dapagliflozin Other (See Comments)   Gabapentin     "drunk" feeling   Ultram [Tramadol] Nausea And Vomiting   Albuterol Other (See Comments)    Panic attack   PAST MEDICAL HISTORY Past Medical History:  Diagnosis Date   AAA (abdominal aortic aneurysm) (HCC)    ALLERGIC RHINITIS    Anemia    post op, treated /w Fe   Anxiety    Arthritis    OA, hands, - thumbs- specifically   Brachial plexopathy    double crush injury with median nerve damage on the right dominant hand.    COPD (chronic obstructive pulmonary disease) ([place])    also states he has been told that he has slight emphysema    Dissecting aortic  aneurysm, thoracic ([place]) 02/20/2012   s/p emergent surgical repair   DM2 (diabetes mellitus, type 2) ([place])    DVT (deep venous thrombosis) (HCC)    coumadin- x3 months , post op   ERECTILE DYSFUNCTION, ORGANIC    GERD (gastroesophageal reflux disease)    related to diet & anxiety    HYPERLIPIDEMIA    Hypertension    pt. followwed by Dr. Johnsie Cancel   MIGRAINE HEADACHE    induced from physical , emotional stress , but sometimes occur randomly, also experiences BPPV   OBSTRUCTIVE SLEEP APNEA 02/2008 sleep study   moderate & central- not able to tolerate CPAP    Thyroid nodule 04/21/2015   2.3 cm on right, incidental finding on 03/2015 CT chest - f/u US ordered   Unspecified vitamin D deficiency    Past Surgical History:  Procedure Laterality Date   ABDOMINAL  AORTIC ANEURYSM REPAIR N/A 11/01/2012   Procedure: ANEURYSM ABDOMINAL AORTIC REPAIR;  Surgeon: Mal Misty, MD;  Location: [facility];  Service: Vascular;  Laterality: N/A;  Resection and Grafting of Abdominal Aortic Aneurysm,(AORTA BI ILIAC)   Nasal fx without repair     THORACIC AORTIC ANEURYSM REPAIR  02/20/2012   Procedure: THORACIC ASCENDING ANEURYSM REPAIR (AAA);  Surgeon: Ivin Poot, MD;  Location: [facility];  Service: Open Heart Surgery;  Laterality: N/A;   FAMILY HISTORY Family History  Problem Relation Age of Onset   Diabetes Mother    Stroke Mother    Dementia Mother    Deep vein thrombosis Father        PE   Varicose Veins Father    Heart disease Maternal Grandmother    Heart disease Maternal Uncle    Heart disease Paternal Uncle    Heart disease Cousin    Lung cancer Cousin        2 cousins   SOCIAL HISTORY Social History   Tobacco Use   Smoking status: Former    Packs/day: 0.10    Types: Cigarettes    Quit date: 02/18/2012    Years since quitting: 9.9   Smokeless tobacco: Never   Tobacco comments:    10 cigs/day, quit 02/2012 after TAA emergent repair  Vaping Use   Vaping Use: Never used  Substance Use Topics    Alcohol use: Yes    Alcohol/week: 0.0 standard drinks of alcohol    Comment: shot of Tequila every day    Drug use: No       OPHTHALMIC EXAM:  Base Eye Exam     Visual Acuity (Snellen - Linear)       Right Left   Dist cc 20/20 20/25   Dist ph cc  20/25 +2    Correction: Glasses         Tonometry (Tonopen, 10:06 AM)       Right Left   Pressure 15 12         Pupils       Dark Light Shape React APD   Right 4 3 Round Brisk None   Left 4 3 Round Brisk None         Visual Fields (Counting fingers)       Left Right    Full Full         Extraocular Movement       Right Left    Full, Ortho Full, Ortho         Neuro/Psych     Oriented x3: Yes   Mood/Affect: Normal         Dilation     Both eyes: 1.0% Mydriacyl, 2.5% Phenylephrine @ 10:06 AM           Slit Lamp and Fundus Exam     Slit Lamp Exam       Right Left   Lids/Lashes dermatochalasis dermatochalasis   Conjunctiva/Sclera white and quiet white and quiet   Cornea arcus, trace tear film debris arcus, trace tear film debris   Anterior Chamber deep and clear deep and clear   Iris round and dilated round and dilated   Lens 2-3+ NS, 2-3+ cortical 2-3+ NS, 2-3+ cortical   Anterior Vitreous syneresis, PVD syneresis, PVD         Fundus Exam       Right Left   Disc pink and sharp pink and sharp   C/D Ratio 0.5 0.4  Macula flat, good foveal reflex, mild RPE mottling, no heme or edema flat, good foveal reflex, mild RPE mottling, no heme or edema   Vessels mildly attenuated, mild tortuosity mildly attenuated, mild tortuosity   Periphery attached, no heme, no RT/RD attached, no heme , no RT/RD           IMAGING AND PROCEDURES  Imaging and Procedures for 02/01/2022  OCT, Retina - OU - Both Eyes       Right Eye Quality was good. Central Foveal Thickness: 251. Progression has no prior data. Findings include normal foveal contour, no IRF, no SRF (Incomplete PVD).   Left  Eye Quality was good. Central Foveal Thickness: 245. Progression has no prior data. Findings include normal foveal contour, no IRF, no SRF (Partial PVD).   Notes *Images captured and stored on drive  Diagnosis / Impression:   OD: NFP, no IRF/SRF; incomplete PVD OS: NFP, nor IRF/SRF; partial PVD  Clinical management:  See below  Abbreviations: NFP - Normal foveal profile. CME - cystoid macular edema. PED - pigment epithelial detachment. IRF - intraretinal fluid. SRF - subretinal fluid. EZ - ellipsoid zone. ERM - epiretinal membrane. ORA - outer retinal atrophy. ORT - outer retinal tubulation. SRHM - subretinal hyper-reflective material. IRHM - intraretinal hyper-reflective material            ASSESSMENT/PLAN:    ICD-10-CM   1. Photopsia of right eye  H53.19     2. Posterior vitreous detachment of right eye  H43.811 OCT, Retina - OU - Both Eyes    3. Diabetes mellitus type 2 without retinopathy ([place])  E11.9 OCT, Retina - OU - Both Eyes    4. Essential hypertension  I10     5. Hypertensive retinopathy of both eyes  H35.033     6. Combined forms of age-related cataract of both eyes  H25.813      Call coverage pt -- [place] pt  1,2. Photopsias, incomplete PVD OD  - 1 day history of symptomatic photopsias -- worse / triggered by eye movements; denies floaters  - onset Sunday 830pm 08.20.23  - OCT shows incomplete PVD -- still partially attached on nasal disc  - No RT or RD on 360 peripheral exam  - Discussed findings and prognosis  - Reviewed s/s of RT/RD  - Strict return precautions for any such RT/RD signs/symptoms  - f/u in 4-6 wks -- DFE/OCT  3. Diabetes mellitus, type 2 without retinopathy - The incidence, risk factors for progression, natural history and treatment options for diabetic retinopathy  were discussed with patient.   - The need for close monitoring of blood glucose, blood pressure, and serum lipids, avoiding cigarette or any type of tobacco, and  the need for long term follow up was also discussed with patient.  4,5. Hypertensive retinopathy OU - discussed importance of tight BP control - monitor  6. Age related cataracts OU - The symptoms of cataract, surgical options, and treatments and risks were discussed with patient. - discussed diagnosis and progression - under the expert management of [place] Ordered this visit:  No orders of the defined types were placed in this encounter.    Return for 4-6 wks - photopsias / PVD OD - DFE, OCT.  There are no Patient Instructions on file for this visit.  Explained the diagnoses, plan, and follow up with the patient and they expressed understanding.  Patient expressed understanding of the importance of proper follow up care.  This document serves as a record of services personally performed by Gardiner Sleeper, MD, PhD. It was created on their behalf by San Jetty. Owens Shark, OA an ophthalmic technician. The creation of this record is the provider's dictation and/or activities during the visit.    Electronically signed by: San Jetty. Owens Shark, [place] 08.21.2023 11:46 AM  This document serves as a record of services personally performed by Gardiner Sleeper, MD, PhD. It was created on their behalf by Roselee Nova, COMT. The creation of this record is the provider's dictation and/or activities during the visit.  Electronically signed by: Roselee Nova, COMT 02/01/22 11:46 AM  Gardiner Sleeper, M.D., Ph.D. Diseases & Surgery of the Retina and Vitreous [facility]  I have reviewed the above documentation for accuracy and completeness, and I agree with the above. Gardiner Sleeper, M.D., Ph.D. 02/01/22 11:46 AM  Abbreviations: M myopia (nearsighted); A astigmatism; H hyperopia (farsighted); P presbyopia; Mrx spectacle prescription;  CTL contact lenses; OD right eye; OS left eye; OU both eyes  XT exotropia; ET esotropia; PEK punctate epithelial keratitis; PEE  punctate epithelial erosions; DES dry eye syndrome; MGD meibomian gland dysfunction; ATs artificial tears; PFAT's preservative free artificial tears; [place] nuclear sclerotic cataract; PSC posterior subcapsular cataract; ERM epi-retinal membrane; PVD posterior vitreous detachment; RD retinal detachment; DM diabetes mellitus; DR diabetic retinopathy; NPDR non-proliferative diabetic retinopathy; PDR proliferative diabetic retinopathy; CSME clinically significant macular edema; DME diabetic macular edema; dbh dot blot hemorrhages; CWS cotton wool spot; POAG primary open angle glaucoma; C/D cup-to-disc ratio; HVF humphrey visual field; GVF goldmann visual field; OCT optical coherence tomography; IOP intraocular pressure; BRVO Branch retinal vein occlusion; CRVO central retinal vein occlusion; CRAO central retinal artery occlusion; BRAO branch retinal artery occlusion; RT retinal tear; SB scleral buckle; PPV pars plana vitrectomy; VH Vitreous hemorrhage; PRP panretinal laser photocoagulation; IVK intravitreal kenalog; VMT vitreomacular traction; MH Macular hole;  NVD neovascularization of the disc; NVE neovascularization elsewhere; AREDS age related eye disease study; ARMD age related macular degeneration; POAG primary open angle glaucoma; EBMD epithelial/anterior basement membrane dystrophy; ACIOL anterior chamber intraocular lens; IOL intraocular lens; PCIOL posterior chamber intraocular lens; Phaco/IOL phacoemulsification with intraocular lens placement; [place] photorefractive keratectomy; LASIK laser assisted in situ keratomileusis; HTN hypertension; DM diabetes mellitus; COPD chronic obstructive pulmonary disease

## 2022-02-02 DIAGNOSIS — F411 Generalized anxiety disorder: Secondary | ICD-10-CM | POA: Diagnosis not present

## 2022-02-04 ENCOUNTER — Other Ambulatory Visit (HOSPITAL_COMMUNITY): Payer: Self-pay

## 2022-02-05 ENCOUNTER — Other Ambulatory Visit (HOSPITAL_COMMUNITY): Payer: Self-pay

## 2022-02-11 ENCOUNTER — Other Ambulatory Visit (HOSPITAL_COMMUNITY): Payer: Self-pay

## 2022-02-24 ENCOUNTER — Other Ambulatory Visit (HOSPITAL_COMMUNITY): Payer: Self-pay

## 2022-03-02 DIAGNOSIS — F411 Generalized anxiety disorder: Secondary | ICD-10-CM | POA: Diagnosis not present

## 2022-03-04 NOTE — Progress Notes (Signed)
[facility] Note  03/16/2022     CHIEF COMPLAINT Patient presents for Retina Follow Up   HISTORY OF PRESENT ILLNESS: [name] is a 71 y.o. male who presents to the clinic today for:   HPI     Retina Follow Up   Patient presents with  Other.  In right eye.  This started 6 weeks ago.  I, the attending physician,  performed the HPI with the patient and updated documentation appropriately.        Comments   Patient here for 6 weeks retina follow up for Photopsia OD. Patient states vision about the same. Started having floater after last visit. Lasted for 2 days. Then went away. On occasion sees a streak go across the floor but nothing there. No eye pain. Has fatigue.      Last edited by Bernarda Caffey, MD on 03/16/2022  4:36 PM.    Pt states the day after he as here last, he started seeing "fuzzy things" in his vision, but they are gone now  Referring physician: [facility] [address] [place],  [place] [address]  HISTORICAL INFORMATION:   Selected notes from the MEDICAL RECORD NUMBER Pt of Dr. Katy Fitch -- call LEE:  Ocular Hx- PMH-    CURRENT MEDICATIONS: No current outpatient medications on file. (Ophthalmic Drugs)   No current facility-administered medications for this visit. (Ophthalmic Drugs)   Current Outpatient Medications (Other)  Medication Sig   ALPRAZolam (XANAX) 1 MG tablet Take 0.5 tablets (0.5 mg total) by mouth 4 (four) times daily.   amoxicillin (AMOXIL) 500 MG capsule Take 4 capsules (2,000 mg total) by mouth 1 hour before dental procedure. Save remaining capsules for future appointments   amoxicillin (AMOXIL) 500 MG capsule Take 1 capsule (500 mg total) by mouth every 8 (eight) hours until gone   aspirin EC 81 MG tablet Take 81 mg by mouth daily with lunch.   azelastine (ASTELIN) 0.1 % nasal spray Place 2 sprays into both nostrils 2 (two) times daily. Office visit is due   bisacodyl (DULCOLAX)  10 MG suppository Place 1 suppository (10 mg total) rectally daily as needed for moderate constipation.   calcium carbonate (TUMS EX) 750 MG chewable tablet Chew 1 tablet by mouth daily as needed for heartburn.    Cetirizine HCl (ZYRTEC PO) Take by mouth.   cholecalciferol (VITAMIN D) 1000 UNITS tablet Take 2,000 Units by mouth daily.   EPINEPHrine 0.3 mg/0.3 mL IJ SOAJ injection INJECT 0.3 MLS INTO THE MUSCLE ONCE AS DIRECTED   fexofenadine (ALLEGRA) 180 MG tablet Take 90 mg by mouth 2 (two) times daily.   fluticasone (FLONASE) 50 MCG/ACT nasal spray Place 2 sprays into both nostrils daily.   hydrochlorothiazide (HYDRODIURIL) 25 MG tablet Take 1 tablet (25 mg total) by mouth daily. ANNUAL APPT DUE IN OCT MUST SEE PROVIDER FOR FUTURE REFILLS   magic mouthwash (nystatin, hydrocortisone, diphenhydrAMINE) suspension Swish with 71m's by mouth then spit out 2 times a day   metFORMIN (GLUCOPHAGE-XR) 500 MG 24 hr tablet Take 3 tablets (1,500 mg total) by mouth daily with breakfast.   metoprolol tartrate (LOPRESSOR) 25 MG tablet Take 1 tablet (25 mg total) by mouth 3 (three) times daily.   montelukast (SINGULAIR) 10 MG tablet Take 0.5 tablets (5 mg total) by mouth 2 (two) times daily.   Multiple Vitamin (MULTIVITAMIN WITH MINERALS) TABS Take 1 tablet by mouth daily.   mupirocin ointment (BACTROBAN)  2 % Apply topically 3 (three) times daily.   omega-3 acid ethyl esters (LOVAZA) 1 g capsule Take 1 capsule (1 g total) by mouth 2 (two) times daily.   OVER THE COUNTER MEDICATION Apply 1 application topically daily as needed (for acne). Oxy 10 vanishing cream   polyethylene glycol powder (GLYCOLAX/MIRALAX) powder MIX 17G WITH LIQUID AND DRINK BY MOUTH DAILY.   PREVIDENT 5000 DRY MOUTH 1.1 % GEL dental gel APPLY THIN RIBBON AND BRUSH FOR 2 MINUTES DAILY IN PLACE OF CONVENTIONAL TOOTHPASTE   rosuvastatin (CRESTOR) 10 MG tablet Take 1 tablet (10 mg total) by mouth daily.   sodium fluoride (PREVIDENT) 1.1 % GEL  dental gel Apply thin ribbon/pea-sized amount to toothbrush. Brush teeth thoroughly, for at least 2 min. Use daily in place of conventional toothpaste.   thiamine (VITAMIN B-1) 100 MG tablet Take 100 mg by mouth 2 (two) times daily at 10 AM and 5 PM.    TURMERIC CURCUMIN PO Take by mouth.   vitamin B-12 (CYANOCOBALAMIN) 500 MCG tablet Take 500-1,000 mcg by mouth daily.    No current facility-administered medications for this visit. (Other)   REVIEW OF SYSTEMS: ROS   Positive for: Endocrine, Cardiovascular, Eyes Negative for: Constitutional, Gastrointestinal, Neurological, Skin, Genitourinary, Musculoskeletal, HENT, Respiratory, Psychiatric, Allergic/Imm, Heme/Lymph Last edited by Theodore Demark, COA on 03/16/2022  3:01 PM.     ALLERGIES Allergies  Allergen Reactions   Testosterone Shortness Of Breath    Medication:Androgel Pt states trouble breathing at night   Codeine Nausea And Vomiting   Dapagliflozin Other (See Comments)   Gabapentin     "drunk" feeling   Ultram [Tramadol] Nausea And Vomiting   Albuterol Other (See Comments)    Panic attack   PAST MEDICAL HISTORY Past Medical History:  Diagnosis Date   AAA (abdominal aortic aneurysm) (HCC)    ALLERGIC RHINITIS    Anemia    post op, treated /w Fe   Anxiety    Arthritis    OA, hands, - thumbs- specifically   Brachial plexopathy    double crush injury with median nerve damage on the right dominant hand.    COPD (chronic obstructive pulmonary disease) ([place])    also states he has been told that he has slight emphysema    Dissecting aortic aneurysm, thoracic ([place]) 02/20/2012   s/p emergent surgical repair   DM2 (diabetes mellitus, type 2) ([place])    DVT (deep venous thrombosis) (HCC)    coumadin- x3 months , post op   ERECTILE DYSFUNCTION, ORGANIC    GERD (gastroesophageal reflux disease)    related to diet & anxiety    HYPERLIPIDEMIA    Hypertension    pt. followwed by Dr. Johnsie Cancel   MIGRAINE HEADACHE    induced from  physical , emotional stress , but sometimes occur randomly, also experiences BPPV   OBSTRUCTIVE SLEEP APNEA 02/2008 sleep study   moderate & central- not able to tolerate CPAP    Thyroid nodule 04/21/2015   2.3 cm on right, incidental finding on 03/2015 CT chest - f/u US ordered   Unspecified vitamin D deficiency    Past Surgical History:  Procedure Laterality Date   ABDOMINAL AORTIC ANEURYSM REPAIR N/A 11/01/2012   Procedure: ANEURYSM ABDOMINAL AORTIC REPAIR;  Surgeon: Mal Misty, MD;  Location: [facility] OR;  Service: Vascular;  Laterality: N/A;  Resection and Grafting of Abdominal Aortic Aneurysm,(AORTA BI ILIAC)   Nasal fx without repair     THORACIC AORTIC ANEURYSM REPAIR  02/20/2012  Procedure: THORACIC ASCENDING ANEURYSM REPAIR (AAA);  Surgeon: Ivin Poot, MD;  Location: [facility];  Service: Open Heart Surgery;  Laterality: N/A;   FAMILY HISTORY Family History  Problem Relation Age of Onset   Diabetes Mother    Stroke Mother    Dementia Mother    Deep vein thrombosis Father        PE   Varicose Veins Father    Heart disease Maternal Grandmother    Heart disease Maternal Uncle    Heart disease Paternal Uncle    Heart disease Cousin    Lung cancer Cousin        2 cousins   SOCIAL HISTORY Social History   Tobacco Use   Smoking status: Former    Packs/day: 0.10    Types: Cigarettes    Quit date: 02/18/2012    Years since quitting: 10.0   Smokeless tobacco: Never   Tobacco comments:    10 cigs/day, quit 02/2012 after TAA emergent repair  Vaping Use   Vaping Use: Never used  Substance Use Topics   Alcohol use: Yes    Alcohol/week: 0.0 standard drinks of alcohol    Comment: shot of Tequila every day    Drug use: No       OPHTHALMIC EXAM:  Base Eye Exam     Visual Acuity (Snellen - Linear)       Right Left   Dist cc 20/20 20/25 -2   Dist ph cc  20/20 -2    Correction: Glasses         Tonometry (Tonopen, 2:58 PM)       Right Left   Pressure 14 16          Pupils       Dark Light Shape React APD   Right 4 3 Round Brisk None   Left 4 3 Round Brisk None         Visual Fields (Counting fingers)       Left Right    Full Full         Extraocular Movement       Right Left    Full, Ortho Full, Ortho         Neuro/Psych     Oriented x3: Yes   Mood/Affect: Normal         Dilation     Both eyes: 1.0% Mydriacyl, 2.5% Phenylephrine @ 2:58 PM           Slit Lamp and Fundus Exam     Slit Lamp Exam       Right Left   Lids/Lashes dermatochalasis dermatochalasis   Conjunctiva/Sclera white and quiet white and quiet   Cornea arcus, mild tear film debris arcus, trace tear film debris   Anterior Chamber deep and clear deep and clear   Iris round and dilated round and dilated   Lens 2-3+ NS with mild brunescence, 2-3+ cortical 2-3+ NS with mild brunescence, 2-3+ cortical   Anterior Vitreous syneresis, PVD syneresis, PVD         Fundus Exam       Right Left   Disc pink and sharp pink and sharp, mild PPA   C/D Ratio 0.5 0.5   Macula flat, good foveal reflex, mild RPE mottling, no heme or edema flat, good foveal reflex, mild RPE mottling, no heme or edema   Vessels attenuated, Tortuous attenuated, Tortuous   Periphery attached, no heme, no RT/RD attached, focal IRH ST, no RT/RD  Refraction     Wearing Rx       Sphere Cylinder Axis Add   Right +2.00 +1.25 008 +1.75   Left +2.25 +0.75 156 +1.75           IMAGING AND PROCEDURES  Imaging and Procedures for 03/16/2022  OCT, Retina - OU - Both Eyes       Right Eye Quality was good. Central Foveal Thickness: 258. Progression has been stable. Findings include normal foveal contour, no IRF, no SRF (Interval release of partial PVD).   Left Eye Quality was good. Central Foveal Thickness: 246. Progression has been stable. Findings include normal foveal contour, no IRF, no SRF (Partial PVD).   Notes *Images captured and stored on  drive  Diagnosis / Impression:   OD: NFP, no IRF/SRF; Interval release of partial PVD OS: NFP, nor IRF/SRF; partial PVD  Clinical management:  See below  Abbreviations: NFP - Normal foveal profile. CME - cystoid macular edema. PED - pigment epithelial detachment. IRF - intraretinal fluid. SRF - subretinal fluid. EZ - ellipsoid zone. ERM - epiretinal membrane. ORA - outer retinal atrophy. ORT - outer retinal tubulation. SRHM - subretinal hyper-reflective material. IRHM - intraretinal hyper-reflective material            ASSESSMENT/PLAN:    ICD-10-CM   1. Photopsia of right eye  H53.19 OCT, Retina - OU - Both Eyes    2. Posterior vitreous detachment of right eye  H43.811     3. Diabetes mellitus type 2 without retinopathy ([place])  E11.9     4. Essential hypertension  I10     5. Hypertensive retinopathy of both eyes  H35.033     6. Combined forms of age-related cataract of both eyes  H25.813      1,2. Photopsias, PVD OD -- improved  - originally presented on call (08.21.23) w/ 1 day history of symptomatic photopsias -- worse / triggered by eye movements; denies floaters  - onset Sunday 830pm 08.20.23  - OCT today shows interval release of partial PVD  - No RT or RD on 360 peripheral exam  - Discussed findings and prognosis  - Reviewed s/s of RT/RD  - Strict return precautions for any such RT/RD signs/symptoms  - pt is cleared from a retina standpoint for release to [facility] and resumption of primary eye care  3. Diabetes mellitus, type 2 without retinopathy - The incidence, risk factors for progression, natural history and treatment options for diabetic retinopathy  were discussed with patient.   - The need for close monitoring of blood glucose, blood pressure, and serum lipids, avoiding cigarette or any type of tobacco, and the need for long term follow up was also discussed with patient.  4,5. Hypertensive retinopathy OU - discussed importance of tight BP  control - monitor  6. Age related cataracts OU - The symptoms of cataract, surgical options, and treatments and risks were discussed with patient. - discussed diagnosis and progression - under the expert management of [place] Ordered this visit:  No orders of the defined types were placed in this encounter.    Return if symptoms worsen or fail to improve.  There are no Patient Instructions on file for this visit.  Explained the diagnoses, plan, and follow up with the patient and they expressed understanding.  Patient expressed understanding of the importance of proper follow up care.   This document serves as a record of services personally performed by Sharyon Cable.  Coralyn Pear, MD, PhD. It was created on their behalf by Renaldo Reel, [place] an ophthalmic technician. The creation of this record is the provider's dictation and/or activities during the visit.    Electronically signed by:  Renaldo Reel, COT  03/04/22 4:36 PM  This document serves as a record of services personally performed by Gardiner Sleeper, MD, PhD. It was created on their behalf by San Jetty. Owens Shark, OA an ophthalmic technician. The creation of this record is the provider's dictation and/or activities during the visit.    Electronically signed by: San Jetty. Owens Shark, [place] 10.03.2023 4:36 PM  Gardiner Sleeper, M.D., Ph.D. Diseases & Surgery of the Retina and Vitreous [facility]  I have reviewed the above documentation for accuracy and completeness, and I agree with the above. Gardiner Sleeper, M.D., Ph.D. 03/16/22 4:39 PM  Abbreviations: M myopia (nearsighted); A astigmatism; H hyperopia (farsighted); P presbyopia; Mrx spectacle prescription;  CTL contact lenses; OD right eye; OS left eye; OU both eyes  XT exotropia; ET esotropia; PEK punctate epithelial keratitis; PEE punctate epithelial erosions; DES dry eye syndrome; MGD meibomian gland dysfunction; ATs artificial tears;  PFAT's preservative free artificial tears; [place] nuclear sclerotic cataract; PSC posterior subcapsular cataract; ERM epi-retinal membrane; PVD posterior vitreous detachment; RD retinal detachment; DM diabetes mellitus; DR diabetic retinopathy; NPDR non-proliferative diabetic retinopathy; PDR proliferative diabetic retinopathy; CSME clinically significant macular edema; DME diabetic macular edema; dbh dot blot hemorrhages; CWS cotton wool spot; POAG primary open angle glaucoma; C/D cup-to-disc ratio; HVF humphrey visual field; GVF goldmann visual field; OCT optical coherence tomography; IOP intraocular pressure; BRVO Branch retinal vein occlusion; CRVO central retinal vein occlusion; CRAO central retinal artery occlusion; BRAO branch retinal artery occlusion; RT retinal tear; SB scleral buckle; PPV pars plana vitrectomy; VH Vitreous hemorrhage; PRP panretinal laser photocoagulation; IVK intravitreal kenalog; VMT vitreomacular traction; MH Macular hole;  NVD neovascularization of the disc; NVE neovascularization elsewhere; AREDS age related eye disease study; ARMD age related macular degeneration; POAG primary open angle glaucoma; EBMD epithelial/anterior basement membrane dystrophy; ACIOL anterior chamber intraocular lens; IOL intraocular lens; PCIOL posterior chamber intraocular lens; Phaco/IOL phacoemulsification with intraocular lens placement; [place] photorefractive keratectomy; LASIK laser assisted in situ keratomileusis; HTN hypertension; DM diabetes mellitus; COPD chronic obstructive pulmonary disease

## 2022-03-08 DIAGNOSIS — Z7984 Long term (current) use of oral hypoglycemic drugs: Secondary | ICD-10-CM | POA: Diagnosis not present

## 2022-03-08 DIAGNOSIS — I10 Essential (primary) hypertension: Secondary | ICD-10-CM | POA: Diagnosis not present

## 2022-03-08 DIAGNOSIS — Z87891 Personal history of nicotine dependence: Secondary | ICD-10-CM | POA: Diagnosis not present

## 2022-03-08 DIAGNOSIS — E119 Type 2 diabetes mellitus without complications: Secondary | ICD-10-CM | POA: Diagnosis not present

## 2022-03-08 DIAGNOSIS — Z6828 Body mass index (BMI) 28.0-28.9, adult: Secondary | ICD-10-CM | POA: Diagnosis not present

## 2022-03-10 ENCOUNTER — Other Ambulatory Visit: Payer: Self-pay | Admitting: Internal Medicine

## 2022-03-10 ENCOUNTER — Other Ambulatory Visit (HOSPITAL_COMMUNITY): Payer: Self-pay

## 2022-03-10 MED ORDER — FLUTICASONE PROPIONATE 50 MCG/ACT NA SUSP
2.0000 | Freq: Every day | NASAL | 2 refills | Status: DC
Start: 2022-03-10 — End: 2022-06-21
  Filled 2022-03-10: qty 48, 90d supply, fill #0

## 2022-03-11 ENCOUNTER — Encounter: Payer: Self-pay | Admitting: Neurology

## 2022-03-11 ENCOUNTER — Other Ambulatory Visit (HOSPITAL_COMMUNITY): Payer: Self-pay

## 2022-03-11 ENCOUNTER — Telehealth: Payer: Self-pay | Admitting: Neurology

## 2022-03-11 ENCOUNTER — Ambulatory Visit: Payer: HMO | Admitting: Neurology

## 2022-03-11 VITALS — BP 137/63 | HR 64 | Ht 75.0 in | Wt 226.0 lb

## 2022-03-11 DIAGNOSIS — R41840 Attention and concentration deficit: Secondary | ICD-10-CM

## 2022-03-11 DIAGNOSIS — I679 Cerebrovascular disease, unspecified: Secondary | ICD-10-CM | POA: Diagnosis not present

## 2022-03-11 DIAGNOSIS — G3184 Mild cognitive impairment, so stated: Secondary | ICD-10-CM

## 2022-03-11 DIAGNOSIS — I97120 Postprocedural cardiac arrest following cardiac surgery: Secondary | ICD-10-CM | POA: Diagnosis not present

## 2022-03-11 NOTE — Telephone Encounter (Signed)
Error- Sent to [facility] centralized scheduling  [phone]

## 2022-03-11 NOTE — Telephone Encounter (Signed)
HTA NPR sent to [facility] nuclear medicine

## 2022-03-11 NOTE — Patient Instructions (Signed)
Assessment:  After physical and neurologic examination, review of laboratory studies, imaging, neurophysiology testing and pre-existing records, assessment :  [name] has well recovered from his brachial plexus injury as documented on his right arm in the past, he had bouts of ataxia and risk of falling before his vertigo was found to be was related to vestibular inflammation during allergy season.Neuropathy- numb feet.   I suspected a watershed related brain injury as these symptoms followed a cardiac arrest and open heart surgery.  Cognitive impairment was again not found on MOCA test.  The patient underwent a [place] cognitive assessment test is 30/ 30 points.   Plan: Treatment plan and additional workup will be reviewed under Problem List.  Had neuropsychological testing with Dr Valentina Shaggy, and not since. I will reorder that testing battery. MRI non contrast in 2022 was unchanged to previous imaging. He could have amyloid angiopathy, and I like to order a PET scan.

## 2022-03-11 NOTE — Progress Notes (Signed)
[facility]  Provider:  Larey Seat, M D  Referring Provider: Hoyt Koch, * Primary Care Physician:  Hoyt Koch, MD  Chief Complaint  Patient presents with   Follow-up    Pt in room #10 and alone. Pt here today for f/u.    HPI:  [name] is a 71 y.o. male  - here for memory concerns- and repeat MOCA test : Today is 11 March 2022 and I have the pleasure to meet with [name]. [name] after 2-year hiatus.  He followed up with our NP [place] in the interval, last seen 03-11-2021.  He is a meanwhile 71 year old man, who has been worried about cognitive decline for quite a while but our suspicion has been that he had a pseudodementia more or less a cognitive misperception about his own abilities due to either depression or anxiety.  His MoCA test today was 30 out of 30 points he is also providing fluent word listing, good visual-spatial and executive function attention and delayed recall and orientation all were intact. In "real life" he experiences too many inputs, not able to multitask, easily loosing traction. Focus shifts. He has 'blips" memory loss for seconds- forgetting that he took his meds. He forgets the beginning of a movie and then is unable to understand the whole story line.   Memory problems started after cardiac arrest, prolonged open heart surgery and anesthesia.  Episodic work finding issues.  Takes diabetic meds and HTN,  Right arm is clumsier he feels, may be just weaker, feet and ankle stiffness, getting spasms.      03-05-2020: [name] has followed here for peripheral nerve injuries as well as for a complex form of sleep apnea that was partially obstructive and partially central, and he could not use CPAP. All this culminated after open heart surgery. He is meanwhile 71 years of age and will turn 89 this November. He worries about dementia ever since heart surgery- " pump brain" He has difficulties with  computer access, remembering password, user names, etc. 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 Forgot his daughter's birthday, has been misspelling words that didn't give him trouble before.  He cannot recognize faces- but recalls names (Anosognosia) !  He recently mistook his cell phone for the TV control. He feels fatigued, not refreshed or restored.  Dr. Asa Lente prescribed Xanax and this has calmed him, helps with sleep. But he is worried about his cognitive function. CPAP failed him, chin strap very uncomfortable -he wants to learn about dental or ENT treatments.  His OSA is resolved , his last sleep test could not detect any OSA - 10-28-2017  , sill some Anxiety, COPD, Hypertension, and Migraine Headache. He has been reporting muscle cramping, morning stiffness- " I feel like hell" - even more fatigue, cognitive decline, he became very lonely over the time of the pandemic, but he also has recently become a grandfather again and his family life is happy.  He does endorse a high score on the geriatric depression score 11 out of 15 points of clinical depression.  He states that his neuropathy has progressed and his feet are profoundly numb and affect his gait stability.  Also, he reports inability to recall password, to reboot and fix his computer, has again lifted the remote to answer the phone- he is concerned.   I believe he has pseudodementia. He is too worried and anxious to partake in activities that would  give him pleasure. He is indecisive, and has less attention to detail, is hyper- focussed on what could go wrong.     02-10-2016 - he was seen here upon referral by Dr. Nils Pyle after reporting confusional episodes. Formerly seen here  from Dr. Sharlet Salina for Brachial neuropathy,08-26-14 Periods are episodes during which he feels confused, slightly disoriented to  Time frame, he feels that he has trouble differentiating if he is dreaming, if he  is awake, is in a memory . He  reports also that his behaviors have changed but he cannot really explain those. #1 his leaving cabinet doors open which is not his habit usually. He misplaces things frequently and gets panicky when not able to locate them. Also there is a difficulty with abstraction. He mentioned that he hurt somebody referred to a saying ; "I've been there,  done that and I brought the T-shirt " he could not understand what the "bought the T-shirt "part was about. He also has noticed that he sometimes gets off the elevator in the hospital and is not sure if he has to go left or right- it takes him a while to reorient himself. He also mentioned an event when he tried to and was preparing to mow his lawn, he had to fill the lawnmower with gasoline and held the petroleum can cap  In  one hand -he forgot what it was used for- until he realized that this was cap that closed the petroleum can. He has also trouble with recognize him some word sometimes in mid sentence sometimes word that he is just reading sometimes words he wants to use and speak. He is less comfortbale with change and new situations, any divergence form his routine.   Amnestic episodes. Family history positive for a mother with  dementia in her 38 's. He spoke to his sister, and she has some symptoms , too. He is weaning off amitriptyline.     Last visit note 8- 2014 CD.  [name] is a 71 year old Caucasian gentleman, who  underwent surgery for an aortic dissection in September 2013. Following the surgery he had first some numbness and weakness involving the right upper extremity. The patient had experienced shortly at the improvement since that time when he reported that he still had some residual numbness of the dominant hand as well as some noticeable weakness no she had no discomfort associated no pain. I referred the patient for I nerve conduction study with EMG which was performed on 12-20-12 by Dr. Floyde Parkins. The patient's blood conduction studies  and EMGs confirm a proximal right median  neuropathy of the brachial plexus, located lately at the ligament of Struthers. The changes were already chronic and moderate to severe. A cervical radiculopathy and impingement of the exiting nerves was not noted it is likely that the patient suffered a so-called double crush syndrome doing has had surgery. It is important to note that the ulnar and radial nerve is of the right extremity are unaffected.  [name]  reports some tingling and he extends his right upper extremity which is a new phenomenon. It is slow not troubling him very much but it is noticeable. The area of dysesthesia affects the skin dermatome. It extends from the medial biceps through the antebrachial region into fingers of his right hand. He had questions about using amitriptyline to treat the tingling numbness. It will  also help with sleep initiation and amitriptyline is used in patients with neck pain as well  as neuropathy. His neuropathy seems also to be related to the surgery and possibly the anesthesia perhaps the loss of blood flow.  The patient reports that his sudden loss of awareness spells that he had suffered around the time of the surgery has not occurred at least for the last four to six weeks. I have asked him to try to avoid decongestants. An EEG performed on 06-1612 was normal.  Review of Systems: Out of a complete 14 system review, the patient complains of only the following symptoms, and all other reviewed systems are negative. See above .   Geriatric depression Score - 11/ 15 points.      03/11/2022    3:41 PM 03/11/2021    2:44 PM 03/05/2020    3:50 PM 08/17/2017    3:37 PM 08/11/2016    3:46 PM  Montreal Cognitive Assessment   Visuospatial/ Executive (0/5) '5 4 5    '$ Naming (0/3) '3 3 3    '$ Attention: Read list of digits (0/2) '2 2 2    '$ Attention: Read list of letters (0/1) '1 1 1    '$ Attention: Serial 7 subtraction starting at 100 (0/3) '3 3 3    '$ Language: Repeat  phrase (0/2) '2 2 1    '$ Language : Fluency (0/1) '1 1 1    '$ Abstraction (0/2) '2 2 2    '$ Delayed Recall (0/5) '5 4 4    '$ Orientation (0/6) '6 6 6    '$ Total '30 28 28    '$ Adjusted Score (based on education)          Information is confidential and restricted. Go to Review Flowsheets to unlock data.     Social History   Socioeconomic History   Marital status: Divorced    Spouse name: Not on file   Number of children: 2   Years of education: acad. deg.   Highest education level: Not on file  Occupational History   Occupation: internal med resident    Employer: [employer]  Tobacco Use   Smoking status: Former    Packs/day: 0.10    Types: Cigarettes    Quit date: 02/18/2012    Years since quitting: 10.0   Smokeless tobacco: Never   Tobacco comments:    10 cigs/day, quit 02/2012 after TAA emergent repair  Vaping Use   Vaping Use: Never used  Substance and Sexual Activity   Alcohol use: Yes    Alcohol/week: 0.0 standard drinks of alcohol    Comment: shot of Tequila every day    Drug use: No   Sexual activity: Not on file  Other Topics Concern   Not on file  Social History Narrative   Patient is single and lives at home alone.    Arboriculturist.   Patient works at [employer] full time.   Right handed.   Caffeine one cup of coffee daily.    Social Determinants of Health   Financial Resource Strain: Low Risk  (08/04/2021)   Overall Financial Resource Strain (CARDIA)    Difficulty of Paying Living Expenses: Not very hard  Food Insecurity: No Food Insecurity (08/04/2021)   Hunger Vital Sign    Worried About Running Out of Food in the Last Year: Never true    Ran Out of Food in the Last Year: Never true  Transportation Needs: No Transportation Needs (08/04/2021)   PRAPARE - Hydrologist (Medical): No    Lack of Transportation (Non-Medical): No  Physical Activity: Not on  file  Stress: No Stress Concern Present (08/04/2021)   [place]    Feeling of Stress : Only a little  Social Connections: Socially Isolated (08/04/2021)   Social Connection and Isolation Panel [NHANES]    Frequency of Communication with Friends and Family: Once a week    Frequency of Social Gatherings with Friends and Family: Once a week    Attends Religious Services: 1 to 4 times per year    Active Member of [employer] or Organizations: No    Attends Music therapist: Never    Marital Status: Divorced  Human resources officer Violence: Not on file    Family History  Problem Relation Age of Onset   Diabetes Mother    Stroke Mother    Dementia Mother    Deep vein thrombosis Father        PE   Varicose Veins Father    Heart disease Maternal Grandmother    Heart disease Maternal Uncle    Heart disease Paternal Uncle    Heart disease Cousin    Lung cancer Cousin        2 cousins    Past Medical History:  Diagnosis Date   AAA (abdominal aortic aneurysm) ([place])    ALLERGIC RHINITIS    Anemia    post op, treated /w Fe   Anxiety    Arthritis    OA, hands, - thumbs- specifically   Brachial plexopathy    double crush injury with median nerve damage on the right dominant hand.    COPD (chronic obstructive pulmonary disease) ([place])    also states he has been told that he has slight emphysema    Dissecting aortic aneurysm, thoracic ([place]) 02/20/2012   s/p emergent surgical repair   DM2 (diabetes mellitus, type 2) ([place])    DVT (deep venous thrombosis) ([facility])    coumadin- x3 months , post op   ERECTILE DYSFUNCTION, ORGANIC    GERD (gastroesophageal reflux disease)    related to diet & anxiety    HYPERLIPIDEMIA    Hypertension    pt. followwed by Dr. Johnsie Cancel   MIGRAINE HEADACHE    induced from physical , emotional stress , but sometimes occur randomly, also experiences BPPV   OBSTRUCTIVE SLEEP APNEA 02/2008 sleep study   moderate & central- not able to tolerate CPAP    Thyroid nodule  04/21/2015   2.3 cm on right, incidental finding on 03/2015 CT chest - f/u US ordered   Unspecified vitamin D deficiency     Past Surgical History:  Procedure Laterality Date   ABDOMINAL AORTIC ANEURYSM REPAIR N/A 11/01/2012   Procedure: ANEURYSM ABDOMINAL AORTIC REPAIR;  Surgeon: Mal Misty, MD;  Location: [facility] OR;  Service: Vascular;  Laterality: N/A;  Resection and Grafting of Abdominal Aortic Aneurysm,(AORTA BI ILIAC)   Nasal fx without repair     THORACIC AORTIC ANEURYSM REPAIR  02/20/2012   Procedure: THORACIC ASCENDING ANEURYSM REPAIR (AAA);  Surgeon: Ivin Poot, MD;  Location: [facility];  Service: Open Heart Surgery;  Laterality: N/A;    Current Outpatient Medications  Medication Sig Dispense Refill   ALPRAZolam (XANAX) 1 MG tablet Take 0.5 tablets (0.5 mg total) by mouth 4 (four) times daily. 60 tablet 5   amoxicillin (AMOXIL) 500 MG capsule Take 4 capsules (2,000 mg total) by mouth 1 hour before dental procedure. Save remaining capsules for future appointments 12 capsule 3   amoxicillin (AMOXIL) 500 MG capsule Take  1 capsule (500 mg total) by mouth every 8 (eight) hours until gone 21 capsule 0   aspirin EC 81 MG tablet Take 81 mg by mouth daily with lunch.     azelastine (ASTELIN) 0.1 % nasal spray Place 2 sprays into both nostrils 2 (two) times daily. Office visit is due 30 mL 0   bisacodyl (DULCOLAX) 10 MG suppository Place 1 suppository (10 mg total) rectally daily as needed for moderate constipation. 12 suppository 0   calcium carbonate (TUMS EX) 750 MG chewable tablet Chew 1 tablet by mouth daily as needed for heartburn.      Cetirizine HCl (ZYRTEC PO) Take by mouth.     cholecalciferol (VITAMIN D) 1000 UNITS tablet Take 2,000 Units by mouth daily.     EPINEPHrine 0.3 mg/0.3 mL IJ SOAJ injection INJECT 0.3 MLS INTO THE MUSCLE ONCE AS DIRECTED 2 each 3   fexofenadine (ALLEGRA) 180 MG tablet Take 90 mg by mouth 2 (two) times daily.     fluticasone (FLONASE) 50 MCG/ACT nasal  spray Place 2 sprays into both nostrils daily. 16 g 2   hydrochlorothiazide (HYDRODIURIL) 25 MG tablet Take 1 tablet (25 mg total) by mouth daily. ANNUAL APPT DUE IN OCT MUST SEE PROVIDER FOR FUTURE REFILLS 90 tablet 1   magic mouthwash (nystatin, hydrocortisone, diphenhydrAMINE) suspension Swish with 11m's by mouth then spit out 2 times a day 140 mL 0   metFORMIN (GLUCOPHAGE-XR) 500 MG 24 hr tablet Take 3 tablets (1,500 mg total) by mouth daily with breakfast. 270 tablet 3   metoprolol tartrate (LOPRESSOR) 25 MG tablet Take 1 tablet (25 mg total) by mouth 3 (three) times daily. 270 tablet 2   montelukast (SINGULAIR) 10 MG tablet Take 0.5 tablets (5 mg total) by mouth 2 (two) times daily. 90 tablet 1   Multiple Vitamin (MULTIVITAMIN WITH MINERALS) TABS Take 1 tablet by mouth daily.     mupirocin ointment (BACTROBAN) 2 % Apply topically 3 (three) times daily. 22 g 0   omega-3 acid ethyl esters (LOVAZA) 1 g capsule Take 1 capsule (1 g total) by mouth 2 (two) times daily. 180 capsule 3   OVER THE COUNTER MEDICATION Apply 1 application topically daily as needed (for acne). Oxy 10 vanishing cream     polyethylene glycol powder (GLYCOLAX/MIRALAX) powder MIX 17G WITH LIQUID AND DRINK BY MOUTH DAILY. 527 g 5   PREVIDENT 5000 DRY MOUTH 1.1 % GEL dental gel APPLY THIN RIBBON AND BRUSH FOR 2 MINUTES DAILY IN PLACE OF CONVENTIONAL TOOTHPASTE  99   rosuvastatin (CRESTOR) 10 MG tablet Take 1 tablet (10 mg total) by mouth daily. 90 tablet 3   sodium fluoride (PREVIDENT) 1.1 % GEL dental gel Apply thin ribbon/pea-sized amount to toothbrush. Brush teeth thoroughly, for at least 2 min. Use daily in place of conventional toothpaste. 100 mL 3   thiamine (VITAMIN B-1) 100 MG tablet Take 100 mg by mouth 2 (two) times daily at 10 AM and 5 PM.      TURMERIC CURCUMIN PO Take by mouth.     vitamin B-12 (CYANOCOBALAMIN) 500 MCG tablet Take 500-1,000 mcg by mouth daily.      No current facility-administered medications for  this visit.    Allergies as of 03/11/2022 - Review Complete 03/11/2022  Allergen Reaction Noted   Testosterone Shortness Of Breath 02/16/2012   Codeine Nausea And Vomiting 10/24/2012   Dapagliflozin Other (See Comments) 05/18/2021   Gabapentin  03/29/2013   Ultram [tramadol] Nausea And Vomiting 11/16/2012  Albuterol Other (See Comments) 05/23/2012    Vitals: BP 137/63 (BP Location: Left Arm, Patient Position: Sitting, Cuff Size: Normal)   Pulse 64   Ht '6\' 3"'$  (1.905 m)   Wt 226 lb (102.5 kg)   BMI 28.25 kg/m  Last Weight:  Wt Readings from Last 1 Encounters:  03/11/22 226 lb (102.5 kg)   Last Height:   Ht Readings from Last 1 Encounters:  03/11/22 '6\' 3"'$  (1.905 m)    February  2016 Dr. Prescott Gum Note  ; [id number] returns for 3 month followup status post repair of Stanford type A ascending dissection with straight graft and resuspension of aortic valve September 2013.the false lumen of the distal aorta remains patent but is not enlarged and last diameter of the descending thoracic aorta by CTA last summer was 4.0 cm. after repair of his acute dissection the patient underwent a repair of a AAA May 2014 by Dr. Kellie Simmering.The patient had a echocardiogram 3 months ago which showed normal LV function, normal aortic valve function without AS or AI. 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 He has stopped smoking. His blood pressure is well-controlled. Today he complains of a persistent numbness and some weakness in his right hand right upper extremity. He also complains of "neurocognitive"problems with abnormal thoughts which combine dreams and hallucinations with reality.he has difficulty with short  term memory and short-term reading comprehension. He had one episode of transient blurred vision after working in the yard recently. Recently he had bilateral carotid Dopplers which showed no significant stenosis. He is in a sinus  rhythm. He takes aspirin daily.   General: The patient is awake, alert and appears not in acute distress. The patient is well groomed. Head: Normocephalic, atraumatic. Neck is supple. Mallampati 3 , neck circumference: 18 Cardiovascular:  Regular rate and rhythm , without  murmurs  Skin:  Without evidence of edema, or rash Trunk: BMI is 28 kg/m2 -  stooped posture.   Neurologic exam : The patient is awake and alert, oriented to place and time.   Memory subjective  described as disturbed - There is a normal attention span & concentration ability.  Speech is fluent without dysarthria, dysphonia , but he reports subjective aphasia.  Mood and affect are appropriate.  Cranial nerves: Pupils are equal and briskly reactive to light. Funduscopic exam without  evidence of pallor or edema.  Facial sensation intact to fine touch. Facial motor strength is symmetric and tongue and uvula move midline.  Motor exam:  Normal tone and normal muscle bulk. The patient has a weaker grip strength on the right than left and a week of pinch strength as well as the thenar atrophy. Since his brachial plexus injury his right arm has become smaller than his left and his right shoulder is slightly droopier than his left. He does have facial symmetry however. Sensory:  Fine touch and vibration , the feet and toes lost fine touch and vibration. He lost temperature sensation .  Coordination: Rapid alternating movements in the fingers/hands is tested and normal. Finger-to-nose maneuver normal  He had initial dysmetria on the left, but not when the maneuver was repeated.    Gait and station: Patient walks without assistive device and is able and assisted stool climb up to the exam table. Strength within normal limits. Stance is stable and normal. Tandem gait is fragmented, he has overcome Vestibulitis - took vestibular rehab, he turns with 4  Steps, which are unfragmented. Romberg testing is negative  Deep tendon reflexes:  in the upper and lower extremities are intact ! . Babinski maneuver response was deferred.    Assessment:  After physical and neurologic examination, review of laboratory studies, imaging, neurophysiology testing and pre-existing records, assessment :  [name] has well recovered from his brachial plexus injury as documented on his right arm in the past, he had bouts of ataxia and risk of falling before his vertigo was found to be was related to vestibular inflammation during allergy season.Neuropathy- numb feet.   I suspected a watershed related brain injury as these symptoms followed a cardiac arrest and open heart surgery.  Micro hemorrhages on MRI .  OCD.GAD Cognitive impairment was again not found on MOCA test.  The patient underwent a [place]ral cognitive assessment test is 30/ 30 points.   Plan: Treatment plan and additional workup will be reviewed under Problem List.  Had neuropsychological testing with Dr Valentina Shaggy, and not since. I will reorder that testing battery. MRI non contrast in 2022 was unchanged to previous imaging.  He could have amyloid angiopathy, and I like to order a PET scan.     Electronically signed by: Larey Seat, MD 03/11/2022 3:54 PM  [facility] Board certified by The [employer] of Sleep Medicine and Diplomate of the [employer] of Sleep Medicine. Board certified In Neurology through the [place], Fellow of the [employer] of Neurology. Medical Director of [facility].

## 2022-03-12 ENCOUNTER — Ambulatory Visit (HOSPITAL_COMMUNITY): Payer: HMO | Attending: Physician Assistant

## 2022-03-12 DIAGNOSIS — I351 Nonrheumatic aortic (valve) insufficiency: Secondary | ICD-10-CM | POA: Diagnosis not present

## 2022-03-15 LAB — ECHOCARDIOGRAM COMPLETE
AR max vel: 2.81 cm2
AV Area VTI: 2.71 cm2
AV Area mean vel: 2.73 cm2
AV Mean grad: 11 mmHg
AV Peak grad: 20.4 mmHg
Ao pk vel: 2.26 m/s
Area-P 1/2: 4.36 cm2
P 1/2 time: 499 msec
S' Lateral: 3.5 cm

## 2022-03-16 ENCOUNTER — Encounter (INDEPENDENT_AMBULATORY_CARE_PROVIDER_SITE_OTHER): Payer: Self-pay | Admitting: Ophthalmology

## 2022-03-16 ENCOUNTER — Ambulatory Visit (INDEPENDENT_AMBULATORY_CARE_PROVIDER_SITE_OTHER): Payer: HMO | Admitting: Ophthalmology

## 2022-03-16 DIAGNOSIS — H43811 Vitreous degeneration, right eye: Secondary | ICD-10-CM | POA: Diagnosis not present

## 2022-03-16 DIAGNOSIS — H35033 Hypertensive retinopathy, bilateral: Secondary | ICD-10-CM | POA: Diagnosis not present

## 2022-03-16 DIAGNOSIS — H5319 Other subjective visual disturbances: Secondary | ICD-10-CM | POA: Diagnosis not present

## 2022-03-16 DIAGNOSIS — E119 Type 2 diabetes mellitus without complications: Secondary | ICD-10-CM

## 2022-03-16 DIAGNOSIS — H25813 Combined forms of age-related cataract, bilateral: Secondary | ICD-10-CM

## 2022-03-16 DIAGNOSIS — I10 Essential (primary) hypertension: Secondary | ICD-10-CM

## 2022-03-18 ENCOUNTER — Encounter: Payer: Self-pay | Admitting: Psychology

## 2022-04-01 ENCOUNTER — Ambulatory Visit (HOSPITAL_COMMUNITY)
Admission: RE | Admit: 2022-04-01 | Discharge: 2022-04-01 | Disposition: A | Discharge status: Home / Self Care | Payer: HMO | Source: Ambulatory Visit | Attending: Neurology | Admitting: Neurology

## 2022-04-01 DIAGNOSIS — G3184 Mild cognitive impairment, so stated: Secondary | ICD-10-CM | POA: Diagnosis present

## 2022-04-01 DIAGNOSIS — I679 Cerebrovascular disease, unspecified: Secondary | ICD-10-CM | POA: Diagnosis present

## 2022-04-01 DIAGNOSIS — R413 Other amnesia: Secondary | ICD-10-CM | POA: Insufficient documentation

## 2022-04-01 DIAGNOSIS — R41840 Attention and concentration deficit: Secondary | ICD-10-CM | POA: Diagnosis present

## 2022-04-01 DIAGNOSIS — I97120 Postprocedural cardiac arrest following cardiac surgery: Secondary | ICD-10-CM | POA: Insufficient documentation

## 2022-04-01 NOTE — Progress Notes (Signed)
Hi [name], [name] you ! No alzheimer type amyloid deposits in your brain ! Memory loss is attributed to prolonged anaesthesia time and low BP during open heart surgery.

## 2022-04-06 ENCOUNTER — Encounter: Payer: Self-pay | Admitting: Neurology

## 2022-04-07 ENCOUNTER — Other Ambulatory Visit (HOSPITAL_BASED_OUTPATIENT_CLINIC_OR_DEPARTMENT_OTHER): Payer: Self-pay

## 2022-04-07 MED ORDER — COMIRNATY 30 MCG/0.3ML IM SUSY
PREFILLED_SYRINGE | INTRAMUSCULAR | 0 refills | Status: DC
  Filled 2022-04-07: qty 0.3, 1d supply, fill #0

## 2022-04-07 MED ORDER — INFLUENZA VAC A&B SA ADJ QUAD 0.5 ML IM PRSY
PREFILLED_SYRINGE | INTRAMUSCULAR | 0 refills | Status: DC
  Filled 2022-04-07: qty 0.5, 1d supply, fill #0

## 2022-04-08 ENCOUNTER — Telehealth: Payer: Self-pay

## 2022-04-08 NOTE — Telephone Encounter (Signed)
Patient is calling in asking if it is normal to have a fever, body aches after receiving the [place] covid shot and flu shot yesterday.

## 2022-04-08 NOTE — Telephone Encounter (Signed)
Spoke to pt--stated feeling a little better and took some OTC tylenol. Notified pt if any symptoms not getting any better/worse go to ER or call the office.

## 2022-04-09 ENCOUNTER — Other Ambulatory Visit (HOSPITAL_BASED_OUTPATIENT_CLINIC_OR_DEPARTMENT_OTHER): Payer: Self-pay

## 2022-04-09 NOTE — Progress Notes (Signed)
Amyloid PET brain scans help differentiate Alzheimer's disease related memory loss from other forms of dementia. This patient had a watershed event , but his memory loss had progressed without any additional vascular or white matter injuries.  Ryan Levering, MD

## 2022-04-12 ENCOUNTER — Other Ambulatory Visit (HOSPITAL_COMMUNITY): Payer: Self-pay

## 2022-04-15 ENCOUNTER — Other Ambulatory Visit: Payer: Self-pay

## 2022-04-15 ENCOUNTER — Encounter (HOSPITAL_BASED_OUTPATIENT_CLINIC_OR_DEPARTMENT_OTHER): Payer: Self-pay | Admitting: *Deleted

## 2022-04-15 ENCOUNTER — Other Ambulatory Visit (HOSPITAL_COMMUNITY): Payer: Self-pay

## 2022-04-15 ENCOUNTER — Emergency Department (HOSPITAL_BASED_OUTPATIENT_CLINIC_OR_DEPARTMENT_OTHER)
Admission: EM | Admit: 2022-04-15 | Discharge: 2022-04-15 | Disposition: A | Discharge status: Home / Self Care | Payer: HMO | Attending: Emergency Medicine | Admitting: Emergency Medicine

## 2022-04-15 DIAGNOSIS — T7840XA Allergy, unspecified, initial encounter: Secondary | ICD-10-CM | POA: Diagnosis not present

## 2022-04-15 DIAGNOSIS — Z7982 Long term (current) use of aspirin: Secondary | ICD-10-CM | POA: Diagnosis not present

## 2022-04-15 DIAGNOSIS — R21 Rash and other nonspecific skin eruption: Secondary | ICD-10-CM | POA: Diagnosis present

## 2022-04-15 MED ORDER — PREDNISONE 5 MG PO TABS
5.0000 mg | ORAL_TABLET | Freq: Every day | ORAL | 0 refills | Status: AC
  Filled 2022-04-15: qty 5, 5d supply, fill #0

## 2022-04-15 MED ORDER — METHYLPREDNISOLONE SODIUM SUCC 40 MG IJ SOLR
40.0000 mg | Freq: Once | INTRAMUSCULAR | Status: AC
  Administered 2022-04-15: 40 mg via INTRAVENOUS
  Filled 2022-04-15: qty 1

## 2022-04-15 MED ORDER — METHYLPREDNISOLONE SODIUM SUCC 40 MG IJ SOLR
INTRAMUSCULAR | Status: AC
  Filled 2022-04-15: qty 1

## 2022-04-15 MED ORDER — DIPHENHYDRAMINE HCL 50 MG/ML IJ SOLN
INTRAMUSCULAR | Status: AC
  Filled 2022-04-15: qty 1

## 2022-04-15 MED ORDER — ONDANSETRON HCL 4 MG/2ML IJ SOLN
4.0000 mg | Freq: Once | INTRAMUSCULAR | Status: AC
  Administered 2022-04-15: 4 mg via INTRAVENOUS
  Filled 2022-04-15: qty 2

## 2022-04-15 MED ORDER — FAMOTIDINE IN NACL 20-0.9 MG/50ML-% IV SOLN
20.0000 mg | Freq: Once | INTRAVENOUS | Status: AC
  Administered 2022-04-15: 20 mg via INTRAVENOUS
  Filled 2022-04-15: qty 50

## 2022-04-15 MED ORDER — DIPHENHYDRAMINE HCL 50 MG/ML IJ SOLN
25.0000 mg | Freq: Once | INTRAMUSCULAR | Status: AC
Start: 2022-04-15 — End: 2022-04-15
  Administered 2022-04-15: 25 mg via INTRAVENOUS
  Filled 2022-04-15: qty 1

## 2022-04-15 NOTE — ED Triage Notes (Addendum)
Pt c/o allergic reaction that started at 2am. Denies any new meds or foods/products. Pt with general redness/hives to body.  Denies any chest pain. C/o of feeling a little sob. Denies any difficulty swallowing. Tongue is swollen on exam.

## 2022-04-15 NOTE — ED Provider Notes (Signed)
[facility]  Provider Note  CSN: [id number] Arrival date & time: 04/15/22 0434  History Chief Complaint  Patient presents with   Allergic Reaction    [name] is a 71 y.o. male with history of anxiety and allergies to wasp stings reports he woke up with diffuse itchy rash to his arms, legs and trunk. He denies any difficulty swallowing, feels 'a little SOB'. He is unsure what he may be reacting to, he also reports several environmental allergies. He did not take any medications prior to coming to the ED. He has an epipen at home but has never used it. He is reluctant to take steroids because it makes his anxiety worse.    Home Medications Prior to Admission medications   Medication Sig Start Date End Date Taking? Authorizing Provider  predniSONE (DELTASONE) 5 MG tablet Take 1 tablet (5 mg total) by mouth daily for 5 days. 04/15/22 04/20/22 Yes Truddie Hidden, MD  ALPRAZolam Duanne Moron) 1 MG tablet Take 0.5 tablets (0.5 mg total) by mouth 4 (four) times daily. 11/24/21 05/23/22  Hoyt Koch, MD  aspirin EC 81 MG tablet Take 81 mg by mouth daily with lunch.    [provider]  azelastine (ASTELIN) 0.1 % nasal spray Place 2 sprays into both nostrils 2 (two) times daily. Office visit is due 07/01/20   Hoyt Koch, MD  bisacodyl (DULCOLAX) 10 MG suppository Place 1 suppository (10 mg total) rectally daily as needed for moderate constipation. 01/11/17   Lysbeth Penner, FNP  calcium carbonate (TUMS EX) 750 MG chewable tablet Chew 1 tablet by mouth daily as needed for heartburn.     [provider]  Cetirizine HCl (ZYRTEC PO) Take by mouth.    [provider]  cholecalciferol (VITAMIN D) 1000 UNITS tablet Take 2,000 Units by mouth daily. 02/11/11   Rowe Clack, MD  COVID-19 mRNA vaccine [phone] (COMIRNATY) syringe Inject into the muscle. 04/07/22   Carlyle Basques, MD  EPINEPHrine 0.3 mg/0.3 mL IJ SOAJ  injection INJECT 0.3 MLS INTO THE MUSCLE ONCE AS DIRECTED 12/16/21   Hoyt Koch, MD  fexofenadine (ALLEGRA) 180 MG tablet Take 90 mg by mouth 2 (two) times daily.    [provider]  fluticasone (FLONASE) 50 MCG/ACT nasal spray Place 2 sprays into both nostrils daily. 03/10/22   Hoyt Koch, MD  hydrochlorothiazide (HYDRODIURIL) 25 MG tablet Take 1 tablet (25 mg total) by mouth daily. ANNUAL APPT DUE IN OCT MUST SEE PROVIDER FOR FUTURE REFILLS 12/01/21   Hoyt Koch, MD  influenza vaccine adjuvanted (FLUAD) 0.5 ML injection Inject into the muscle. 04/07/22   Carlyle Basques, MD  magic mouthwash (nystatin, hydrocortisone, diphenhydrAMINE) suspension Swish with 54m's by mouth then spit out 2 times a day 11/23/21   BIven Finn DMD  metFORMIN (GLUCOPHAGE-XR) 500 MG 24 hr tablet Take 3 tablets (1,500 mg total) by mouth daily with breakfast. 06/17/21   CHoyt Koch MD  metoprolol tartrate (LOPRESSOR) 25 MG tablet Take 1 tablet (25 mg total) by mouth 3 (three) times daily. 11/24/21   NJosue Hector MD  montelukast (SINGULAIR) 10 MG tablet Take 0.5 tablets (5 mg total) by mouth 2 (two) times daily. 12/01/21   CHoyt Koch MD  Multiple Vitamin (MULTIVITAMIN WITH MINERALS) TABS Take 1 tablet by mouth daily.    [provider]  mupirocin ointment (BACTROBAN) 2 % Apply topically 3 (three) times daily. 06/04/21   MCriselda Peaches  DPM  omega-3 acid ethyl esters (LOVAZA) 1 g capsule Take 1 capsule (1 g total) by mouth 2 (two) times daily. 06/16/21   Hoyt Koch, MD  OVER THE COUNTER MEDICATION Apply 1 application topically daily as needed (for acne). Oxy 10 vanishing cream    [provider]  polyethylene glycol powder (GLYCOLAX/MIRALAX) powder MIX 17G WITH LIQUID AND DRINK BY MOUTH DAILY. 04/18/17   Hoyt Koch, MD  PREVIDENT 5000 DRY MOUTH 1.1 % GEL dental gel APPLY THIN RIBBON AND BRUSH FOR 2 MINUTES DAILY IN PLACE OF  CONVENTIONAL TOOTHPASTE 04/04/17   [provider]  rosuvastatin (CRESTOR) 10 MG tablet Take 1 tablet (10 mg total) by mouth daily. 06/16/21   Hoyt Koch, MD  sodium fluoride (PREVIDENT) 1.1 % GEL dental gel Apply thin ribbon/pea-sized amount to toothbrush. Brush teeth thoroughly, for at least 2 min. Use daily in place of conventional toothpaste. 02/03/21     thiamine (VITAMIN B-1) 100 MG tablet Take 100 mg by mouth 2 (two) times daily at 10 AM and 5 PM.     [provider]  TURMERIC CURCUMIN PO Take by mouth.    [provider]  vitamin B-12 (CYANOCOBALAMIN) 500 MCG tablet Take 500-1,000 mcg by mouth daily.     [provider]  oxyCODONE (OXY IR/ROXICODONE) 5 MG immediate release tablet Take 1 tablet (5 mg total) by mouth every 4 (four) hours as needed. 11/07/12 12/05/12  Ulyses Amor, PA-C     Allergies    Testosterone, Codeine, Dapagliflozin, Gabapentin, Ultram [tramadol], and Albuterol   Review of Systems   Review of Systems Please see HPI for pertinent positives and negatives  Physical Exam BP (!) 144/73   Pulse 62   Temp (!) 97.4 F (36.3 C) (Oral)   Resp 16   Ht '6\' 4"'$  (1.93 m)   Wt 102.5 kg   SpO2 94%   BMI 27.51 kg/m   Physical Exam Vitals and nursing note reviewed.  Constitutional:      Appearance: Normal appearance.  HENT:     Head: Normocephalic and atraumatic.     Nose: Nose normal.     Mouth/Throat:     Mouth: Mucous membranes are dry.     Comments: Normal tongue, RN reported tongue swelling but I do not see any angioedema and patient denies feeling thick/swollen tongue Eyes:     Extraocular Movements: Extraocular movements intact.     Conjunctiva/sclera: Conjunctivae normal.  Cardiovascular:     Rate and Rhythm: Normal rate.  Pulmonary:     Effort: Pulmonary effort is normal.     Breath sounds: Normal breath sounds. No stridor.  Abdominal:     General: Abdomen is flat.     Palpations: Abdomen is soft.      Tenderness: There is no abdominal tenderness.  Musculoskeletal:        General: No swelling. Normal range of motion.     Cervical back: Neck supple.  Skin:    General: Skin is dry.     Findings: Rash (diffuse urticarial rash) present.     Comments: Cyanotic fingers and toes, no delayed cap refil though  Neurological:     General: No focal deficit present.     Mental Status: He is alert.  Psychiatric:        Mood and Affect: Mood normal.     ED Results / Procedures / Treatments   EKG None  Procedures Procedures  Medications Ordered in the ED Medications  diphenhydrAMINE (BENADRYL) 50 MG/ML injection (  Not Given 04/15/22 0516)  methylPREDNISolone sodium succinate (SOLU-MEDROL) 40 mg/mL injection 40 mg (40 mg Intravenous Given 04/15/22 0506)  diphenhydrAMINE (BENADRYL) injection 25 mg (25 mg Intravenous Given 04/15/22 0506)  famotidine (PEPCID) IVPB 20 mg premix (0 mg Intravenous Stopped 04/15/22 0548)  ondansetron (ZOFRAN) injection 4 mg (4 mg Intravenous Given 04/15/22 0555)    Initial Impression and Plan  Patient here with allergic reaction to unknown allergen. Does not appear to have any airway involvement at the time of my evaluation. Will hold off on Epi for now. After long discussion, patient is agreeable to a reduced dose of steroids, understanding this may not be as effective at treating his symptoms. Also ordered benadryl and pepcid.   ED Course   Clinical Course as of 04/15/22 0656  Thu Apr 15, 2022  0559 Patient reports improvement in itchy rash, feeling more drowsy and dizzy now, likely from benadryl. Will continue to monitor. Still no signs of airway involvement.  [CS]  [id number] Patient continues to improve. He expresses concern he may alpha-gal syndrome. Recommend he avoid red meat and discuss that workup with his primary care.RTED for any other concerns. Patient states he can only tolerate '5mg'$  of prednisone at a time due to side effects.  [CS]    Clinical Course User  Index [CS] Truddie Hidden, MD     MDM Rules/Calculators/A&P Medical Decision Making Problems Addressed: Allergic reaction, initial encounter: acute illness or injury  Risk Prescription drug management.    Final Clinical Impression(s) / ED Diagnoses Final diagnoses:  Allergic reaction, initial encounter    Rx / DC Orders ED Discharge Orders          Ordered    predniSONE (DELTASONE) 5 MG tablet  Daily        04/15/22 0655             Truddie Hidden, MD 04/15/22 [phone]

## 2022-04-15 NOTE — Progress Notes (Signed)
Subjective:    Patient ID: [name], male    DOB: 11/18/1950, 71 y.o.   MRN: [id number]      HPI [name] is here for  Chief Complaint  Patient presents with   Alpha Gal testing    Patient seen in the ED yesterday for allergies. Request to be tested    Yesterday early morning he went to the ED with a life threatening allergic reaction. Went to bed at usually time - had acute onset of itching all over - felt like he had fleas on him.  His symptoms worsened.  He saw a little red on his body.  The itches because very bad. Had SOB, headache, palpitations, abdominal discomfort, head felt full.  He looked in the mirror and he was red all over.  He had splotchy redness all over his feet. He was having rigors. He went to the ED.    He was told his tongue was swelling.  He did have a little bit difficulty swallowing.    He did have a new laundry detergent.  She does not recall anything else that was new.  He denies any obvious bug bites or tick bites.   He does have a significant allergies and has been tested in the past and was told he was allergic to everything.  He has an epi pen for yellow jacket bites.     In the ED he received methylprednisolone, zofran, benadyl, pepcid.  He was discharged home on prednisone.    He felt back to normal after the infusion in the ED.     He is concerned about the possibility of alpha gal.  He is concerned about what caused his symptoms and what he needs to avoid.   Medications and allergies reviewed with patient and updated if appropriate.  Current Outpatient Medications on File Prior to Visit  Medication Sig Dispense Refill   ALPRAZolam (XANAX) 1 MG tablet Take 0.5 tablets (0.5 mg total) by mouth 4 (four) times daily. 60 tablet 5   aspirin EC 81 MG tablet Take 81 mg by mouth daily with lunch.     azelastine (ASTELIN) 0.1 % nasal spray Place 2 sprays into both nostrils 2 (two) times daily. Office visit is due 30 mL 0   bisacodyl  (DULCOLAX) 10 MG suppository Place 1 suppository (10 mg total) rectally daily as needed for moderate constipation. 12 suppository 0   calcium carbonate (TUMS EX) 750 MG chewable tablet Chew 1 tablet by mouth daily as needed for heartburn.      Cetirizine HCl (ZYRTEC PO) Take by mouth.     cholecalciferol (VITAMIN D) 1000 UNITS tablet Take 2,000 Units by mouth daily.     COVID-19 mRNA vaccine 2023-2024 (COMIRNATY) syringe Inject into the muscle. 0.3 mL 0   EPINEPHrine 0.3 mg/0.3 mL IJ SOAJ injection INJECT 0.3 MLS INTO THE MUSCLE ONCE AS DIRECTED 2 each 3   fexofenadine (ALLEGRA) 180 MG tablet Take 90 mg by mouth 2 (two) times daily.     fluticasone (FLONASE) 50 MCG/ACT nasal spray Place 2 sprays into both nostrils daily. 16 g 2   hydrochlorothiazide (HYDRODIURIL) 25 MG tablet Take 1 tablet (25 mg total) by mouth daily. ANNUAL APPT DUE IN OCT MUST SEE PROVIDER FOR FUTURE REFILLS 90 tablet 1   influenza vaccine adjuvanted (FLUAD) 0.5 ML injection Inject into the muscle. 0.5 mL 0   magic mouthwash (nystatin, hydrocortisone, diphenhydrAMINE) suspension Swish with 84m's by mouth then spit out 2  times a day 140 mL 0   metFORMIN (GLUCOPHAGE-XR) 500 MG 24 hr tablet Take 3 tablets (1,500 mg total) by mouth daily with breakfast. 270 tablet 3   metoprolol tartrate (LOPRESSOR) 25 MG tablet Take 1 tablet (25 mg total) by mouth 3 (three) times daily. 270 tablet 2   montelukast (SINGULAIR) 10 MG tablet Take 0.5 tablets (5 mg total) by mouth 2 (two) times daily. 90 tablet 1   Multiple Vitamin (MULTIVITAMIN WITH MINERALS) TABS Take 1 tablet by mouth daily.     mupirocin ointment (BACTROBAN) 2 % Apply topically 3 (three) times daily. 22 g 0   omega-3 acid ethyl esters (LOVAZA) 1 g capsule Take 1 capsule (1 g total) by mouth 2 (two) times daily. 180 capsule 3   OVER THE COUNTER MEDICATION Apply 1 application topically daily as needed (for acne). Oxy 10 vanishing cream     polyethylene glycol powder (GLYCOLAX/MIRALAX)  powder MIX 17G WITH LIQUID AND DRINK BY MOUTH DAILY. 527 g 5   predniSONE (DELTASONE) 5 MG tablet Take 1 tablet (5 mg total) by mouth daily for 5 days. 5 tablet 0   PREVIDENT 5000 DRY MOUTH 1.1 % GEL dental gel APPLY THIN RIBBON AND BRUSH FOR 2 MINUTES DAILY IN PLACE OF CONVENTIONAL TOOTHPASTE  99   rosuvastatin (CRESTOR) 10 MG tablet Take 1 tablet (10 mg total) by mouth daily. 90 tablet 3   sodium fluoride (PREVIDENT) 1.1 % GEL dental gel Apply thin ribbon/pea-sized amount to toothbrush. Brush teeth thoroughly, for at least 2 min. Use daily in place of conventional toothpaste. 100 mL 3   thiamine (VITAMIN B-1) 100 MG tablet Take 100 mg by mouth 2 (two) times daily at 10 AM and 5 PM.      TURMERIC CURCUMIN PO Take by mouth.     vitamin B-12 (CYANOCOBALAMIN) 500 MCG tablet Take 500-1,000 mcg by mouth daily.      [DISCONTINUED] oxyCODONE (OXY IR/ROXICODONE) 5 MG immediate release tablet Take 1 tablet (5 mg total) by mouth every 4 (four) hours as needed. 30 tablet 0   No current facility-administered medications on file prior to visit.    Review of Systems  Constitutional:  Negative for chills and fever.  Respiratory:  Negative for cough, shortness of breath and wheezing.   Cardiovascular:  Negative for palpitations.  Skin:  Negative for color change and rash.  Neurological:  Negative for light-headedness and headaches.       Objective:   Vitals:   04/16/22 1002  BP: (!) 124/58  Pulse: 67  Temp: 98.2 F (36.8 C)  SpO2: 96%   BP Readings from Last 3 Encounters:  04/16/22 (!) 124/58  04/15/22 (!) 144/73  03/11/22 137/63   Wt Readings from Last 3 Encounters:  04/16/22 226 lb (102.5 kg)  04/15/22 226 lb (102.5 kg)  03/11/22 226 lb (102.5 kg)   Body mass index is 27.51 kg/m.    Physical Exam Constitutional:      General: He is not in acute distress.    Appearance: Normal appearance. He is not ill-appearing.  HENT:     Head: Normocephalic and atraumatic.     Mouth/Throat:      Mouth: Mucous membranes are moist.     Pharynx: No posterior oropharyngeal erythema.     Comments: No tongue swelling Cardiovascular:     Rate and Rhythm: Normal rate and regular rhythm.     Heart sounds: Murmur heard.  Pulmonary:     Effort: Pulmonary effort is normal.  No respiratory distress.     Breath sounds: Normal breath sounds. No wheezing or rales.  Musculoskeletal:     Cervical back: Neck supple.  Lymphadenopathy:     Cervical: No cervical adenopathy.  Skin:    General: Skin is warm and dry.  Neurological:     Mental Status: He is alert.            Assessment & Plan:      Allergic reaction: New Yesterday morning early he had what sounds like an anaphylactic allergic reaction No obvious cause-he did have new detergents, but does not recall anything else that was new At baseline he does have bad allergies-was told he was allergic to everything in the past Takes Allegra daily Did go to the ED and cocktail there did improve symptoms Discharged home on low-dose prednisone for several days Concerned about alpha gal-denies any known tick bites.  Will test him today for that Did use new detergent Nothing else new or obvious causes Referral ordered to allergy-needs full work-up Continue daily antihistamine He does have an EpiPen at home that he can use if needed

## 2022-04-15 NOTE — Patient Instructions (Addendum)
      Blood work was ordered.   The lab is on the first floor.    Medications changes include :   none    A referral was ordered for allergy.     Someone will call you to schedule an appointment.

## 2022-04-15 NOTE — ED Notes (Signed)
Pt endorses drowsiness but easily arousable to verbal stimuli, pt declines any anxiety or agitation related to steroid injection, pt cont to have moderate amount of tongue redness or swelling, no improvement or worsening at this time. Pt able to speak in complete sentences and denies SOB, controlling oral secretions and is able to swallow.

## 2022-04-15 NOTE — ED Notes (Signed)
Pt. Has red ,diffuse rash to body and tongue is red and swollen. O2 sats 100% on room air. Pt.a&o x 4.

## 2022-04-16 ENCOUNTER — Encounter: Payer: Self-pay | Admitting: Internal Medicine

## 2022-04-16 ENCOUNTER — Ambulatory Visit (INDEPENDENT_AMBULATORY_CARE_PROVIDER_SITE_OTHER): Payer: HMO | Admitting: Internal Medicine

## 2022-04-16 VITALS — BP 124/58 | HR 67 | Temp 98.2°F | Ht 76.0 in | Wt 226.0 lb

## 2022-04-16 DIAGNOSIS — T7840XA Allergy, unspecified, initial encounter: Secondary | ICD-10-CM | POA: Diagnosis not present

## 2022-04-19 LAB — INTERPRETATION:

## 2022-04-19 LAB — ALPHA-GAL PANEL
Allergen, Mutton, f88: 0.3 kU/L — ABNORMAL HIGH
Allergen, Pork, f26: 0.17 kU/L — ABNORMAL HIGH
Beef: 0.34 kU/L — ABNORMAL HIGH
GALACTOSE-ALPHA-1,3-GALACTOSE IGE*: 4.31 kU/L — ABNORMAL HIGH (ref <0.10)

## 2022-04-20 ENCOUNTER — Other Ambulatory Visit (HOSPITAL_COMMUNITY): Payer: Self-pay

## 2022-04-20 ENCOUNTER — Ambulatory Visit (INDEPENDENT_AMBULATORY_CARE_PROVIDER_SITE_OTHER): Payer: HMO | Admitting: Podiatry

## 2022-04-20 DIAGNOSIS — L603 Nail dystrophy: Secondary | ICD-10-CM

## 2022-04-20 DIAGNOSIS — L84 Corns and callosities: Secondary | ICD-10-CM | POA: Diagnosis not present

## 2022-04-20 DIAGNOSIS — E1100 Type 2 diabetes mellitus with hyperosmolarity without nonketotic hyperglycemic-hyperosmolar coma (NKHHC): Secondary | ICD-10-CM

## 2022-04-20 DIAGNOSIS — M2042 Other hammer toe(s) (acquired), left foot: Secondary | ICD-10-CM | POA: Diagnosis not present

## 2022-04-21 ENCOUNTER — Other Ambulatory Visit (HOSPITAL_COMMUNITY): Payer: Self-pay

## 2022-04-22 ENCOUNTER — Telehealth: Payer: Self-pay | Admitting: *Deleted

## 2022-04-22 NOTE — Telephone Encounter (Signed)
        Patient  visited [facility] on 04/15/2022  for allergies   Telephone encounter attempt :  1st  A HIPAA compliant voice message was left requesting a return call.  Instructed patient to call back at [phone].  [facility] [phone] [address] , [place] [address] Email : [email or web address]

## 2022-04-23 NOTE — Progress Notes (Signed)
  Subjective:  Patient ID: [name], male    DOB: [birth year],  MRN: [id number]  Chief Complaint  Patient presents with   Nail Problem    Thick painful toenails   Callouses    Painful callus lesion    71 y.o. male presents with the above complaint. History confirmed with patient.  Overall he is doing well the toenail has regrown and the callus has returned again  Objective:  Physical Exam: warm, good capillary refill, normal DP and PT pulses, normal sensory exam, and dystrophic left fifth toenail there is a submetatarsal 2 hyperkeratotic lesion on the left foot Assessment:   1. Callus of foot   2. Type 2 diabetes mellitus with hyperosmolarity without coma, without long-term current use of insulin ([place])   3. Onychodystrophy      Plan:  Patient was evaluated and treated and all questions answered.  We discussed the onychodystrophy of the fifth toe and how this relates to an adductovarus attitude of the toe.  We discussed treatment of this including surgical and nonsurgical treatment.  I recommended metatarsal pad as well as a silicone toe cap.  He will follow up as needed if this does not improve or worsens  All symptomatic hyperkeratoses were safely debrided with a sterile #15 blade to patient's level of comfort without incident. We discussed preventative and palliative care of these lesions including supportive and accommodative shoegear, padding, prefabricated and custom molded accommodative orthoses, use of a pumice stone and lotions/creams daily.    Return if symptoms worsen or fail to improve.

## 2022-05-05 ENCOUNTER — Other Ambulatory Visit (HOSPITAL_COMMUNITY): Payer: Self-pay

## 2022-05-17 ENCOUNTER — Other Ambulatory Visit (HOSPITAL_COMMUNITY): Payer: Self-pay

## 2022-05-20 ENCOUNTER — Other Ambulatory Visit (HOSPITAL_COMMUNITY): Payer: Self-pay

## 2022-05-21 ENCOUNTER — Encounter: Payer: Self-pay | Admitting: Internal Medicine

## 2022-05-21 ENCOUNTER — Other Ambulatory Visit: Payer: Self-pay | Admitting: Surgery

## 2022-05-21 ENCOUNTER — Other Ambulatory Visit (HOSPITAL_COMMUNITY): Payer: Self-pay

## 2022-05-21 DIAGNOSIS — I714 Abdominal aortic aneurysm, without rupture, unspecified: Secondary | ICD-10-CM

## 2022-05-24 ENCOUNTER — Other Ambulatory Visit (HOSPITAL_COMMUNITY): Payer: Self-pay

## 2022-05-24 ENCOUNTER — Telehealth: Payer: Self-pay | Admitting: Internal Medicine

## 2022-05-25 ENCOUNTER — Encounter: Payer: Self-pay | Admitting: Allergy & Immunology

## 2022-05-25 ENCOUNTER — Ambulatory Visit: Payer: HMO | Admitting: Allergy & Immunology

## 2022-05-25 ENCOUNTER — Other Ambulatory Visit: Payer: Self-pay

## 2022-05-25 ENCOUNTER — Other Ambulatory Visit (HOSPITAL_BASED_OUTPATIENT_CLINIC_OR_DEPARTMENT_OTHER): Payer: Self-pay

## 2022-05-25 VITALS — BP 128/72 | HR 63 | Temp 98.7°F | Resp 18 | Ht 76.0 in | Wt 221.1 lb

## 2022-05-25 DIAGNOSIS — J310 Chronic rhinitis: Secondary | ICD-10-CM | POA: Diagnosis not present

## 2022-05-25 DIAGNOSIS — T782XXD Anaphylactic shock, unspecified, subsequent encounter: Secondary | ICD-10-CM

## 2022-05-25 DIAGNOSIS — T63481D Toxic effect of venom of other arthropod, accidental (unintentional), subsequent encounter: Secondary | ICD-10-CM

## 2022-05-25 NOTE — Patient Instructions (Addendum)
1. Anaphylaxis - We are going to get some labs to look for milk allergy and carageenan gum. - EpiPen training reviewed.  - We can talk Xolair if needed to prevent cross contamination episodes.  2. Stinging insect allergy - We are going to get a stinging insect allergy panel. - EpiPen is up to date.   3. Chronic rhinitis -  We are going to get environmental allergy testing via the blood. - We will call you in 1-2 weeks with the results of the testing.  4. Return in about 1 year (around 05/26/2023).    Please inform us of any Emergency Department visits, hospitalizations, or changes in symptoms. Call us before going to the ED for breathing or allergy symptoms since we might be able to fit you in for a sick visit. Feel free to contact us anytime with any questions, problems, or concerns.  It was a pleasure to meet you today!  Websites that have reliable patient information: 1. American Academy of Asthma, Allergy, and Immunology: www.aaaai.org 2. Food Allergy Research and Education (FARE): foodallergy.org 3. Mothers of Asthmatics: http://www.asthmacommunitynetwork.org 4. American College of Allergy, Asthma, and Immunology: www.acaai.org   COVID-19 Vaccine Information can be found at: ShippingScam.co.uk For questions related to vaccine distribution or appointments, please email [email or web address] or call [phone].   We realize that you might be concerned about having an allergic reaction to the COVID19 vaccines. To help with that concern, WE ARE OFFERING THE COVID19 VACCINES IN OUR OFFICE! Ask the front desk for dates!     "Like" [place] on Facebook and Instagram for our latest updates!      A healthy democracy works best when [employer] participate! Make sure you are registered to vote! If you have moved or changed any of your contact information, you will need to get this updated before voting!  In some cases, you MAY  be able to register to vote online: [email or web address]    Alpha-gal and Red Meat Allergy   Overview An allergy to "alpha-gal" refers to having a severe and potentially life-threatening allergy to a carbohydrate molecule called galactose-alpha-1,3-galactose that is found in most mammalian or "red meat". Unlike other food allergies which typically occur within minutes of ingestion, symptoms from eating red meat such as pork, lamb or beef may be delayed, occurring 3-8 hours after eating. Most food allergies are directed against a protein molecule, but alpha-gal is unusual because it is a carbohydrate, and a delay in its absorption may explain the delay in symptoms.  What are the symptoms of an alpha-gal allergy? As with other food allergies, signs or symptoms of an allergy to alpha-gal may include: Hives and itching  Swelling of your lips, face or eyelids  Shortness of breath, cough or wheezing  Abdominal pain, nausea, diarrhea or vomiting The most severe reaction, anaphylaxis, can present as a combination of several of these symptoms, may include low blood pressure, and is potentially fatal.  Because these symptoms are delayed, you may only wake up with them in the middle of the night after an evening meal.  How is an alpha-gal allergy diagnosed? Diagnosis of this allergy starts with your allergist taking an appropriate history and physical examination. Because the onset is usually quite delayed, it can be hard to associate the symptoms with eating red meat many hours previously. Triggers include any red meat - including beef, pork, lamb or even horse products. It may occur after eating hotdogs and hamburgers. In very rare cases the reaction may extend to  milk or dairy proteins and gelatin.  Your allergist may recommend testing that includes skin tests to the relevant animal proteins and blood tests which measure the levels of a specific immunoglobulin E (IgE)  antibody, to mammalian meats. An investigational blood test, IgE against alpha-gal itself, may also aid in the diagnosis.  How is an alpha-gal allergy treated? Immediate symptoms such as hives or shortness of breath are treated the same as any other food allergy - in an urgent care setting with anti-histamines, epinephrine and other medications. Prevention long-term involves avoidance of all red meat in sensitized individuals. You may be advised to carry an epinephrine auto-injector, to be used in case of subsequent accidental exposures and reaction. These measures do not necessarily mean switching to a full vegetarian diet, since poultry and fish can be consumed and do not cause similar reactions. As with other food allergies, there is the possibility that over time the sensitivity diminishes - although these changes may take many years to become apparent.  How do you become allergic to alpha-gal? Alpha-gal is a molecule carried in the saliva of the [place] tick and other potential arthropods typically after feeding on mammalian blood. People that are bitten by the tick, especially those that are bitten repeatedly, are at risk of becoming sensitized and producing the IgE necessary to then cause allergic reactions. Interestingly, allergic reactions may occur to red meat, to subsequent tick bites, and even to medications that contain alpha-gal. Cetuximab is a cancer medication that contains alpha-gal, and people who have had allergic reactions to this medication (these are typically immediate reactions, because it is infused intravenously) have a higher risk for red meat allergy and are likely to have been bitten by ticks in the past. As might be expected, the incidence of tick bites is much higher in the [place] and [place] the traditional habitat for the tick. However, cases are now increasingly reported in the [place] and [place] states. And it is a phenomenon that has been observed worldwide, with  different ticks responsible for similar cases of red meat allergy in many other countries such as [place], [place] and [place].  The discovery of this peculiar allergy has allowed researchers to correlate tick bites with many cases of anaphylaxis that would previously have been classified as 'idiopathic', or of unknown cause. Also, while it was originally thought that the [employer] tick had to feast on mammalian blood in order to carry the alpha-gal molecule, more recent research has shown that it may carry this molecule and be capable of sensitizing humans independently.  How do you prevent an alpha-gal allergy? Because this allergy is predominantly tick born, you are more likely at risk if you often go outdoors in wooded areas for activities such as hiking, fishing or hunting. The key strategy is to prevent tick bites. This may include wearing long sleeved shirts or pants, using appropriate insect repellants, and surveying for ticks after spending time outdoors. Any observed ticks should be removed carefully by cleaning the site with rubbing alcohol, then using tweezers to pull the tick's head up carefully from the skin using steady pressure. Clean your hands and the site one more time and make sure not to crush the tick between your fingers.      [name]

## 2022-05-25 NOTE — Progress Notes (Unsigned)
NEW PATIENT  Date of Service/Encounter:  05/25/22  Consult requested by: Hoyt Koch, MD   Assessment:   No diagnosis found.  Stinging insect anaphylaxis (yellow jacket)   Plan/Recommendations:    There are no Patient Instructions on file for this visit.   {Blank single:19197::"This note in its entirety was forwarded to the Provider who requested this consultation."}  Subjective:   [name] is a 71 y.o. male presenting today for evaluation of  Chief Complaint  Patient presents with   Allergic Reaction   Pruritus    [name] has a history of the following: Patient Active Problem List   Diagnosis Date Noted   Allergic reaction 04/16/2022   Amnestic MCI (mild cognitive impairment with memory loss) 03/11/2022   Attention deficit 03/11/2022   Small vessel disease, cerebrovascular 03/11/2022   Ganglion cyst of both wrists 01/04/2022   Ear canal abrasion 01/04/2022   History of aortic dissection 05/28/2020   Elevated LFTs 03/20/2020   Benign prostatic hyperplasia without lower urinary tract symptoms 03/20/2020   Skin lesion of scalp 03/20/2020   Pseudodementia 03/05/2020   Cerebral microvascular disease 03/05/2020   Severe episode of recurrent major depressive disorder, without psychotic features ([place]) 03/05/2020   Coronary artery calcification seen on CAT scan 03/17/2018   Ischemic brain damage 08/17/2017   Encounter for general adult medical examination with abnormal findings 07/18/2015   Thyroid nodule 04/21/2015   AAA (abdominal aortic aneurysm) without rupture ([place]) 04/15/2015   MCI (mild cognitive impairment) 08/26/2014   Peripheral neuropathy 03/21/2013   DM2 (diabetes mellitus, type 2) ([place])    Brachial plexopathy    Anxiety    Occlusion and stenosis of carotid artery without mention of cerebral infarction 04/11/2012   Vitamin D deficiency 12/17/2008   Hyperlipidemia associated with type 2 diabetes mellitus ([place]) 12/17/2008    OBSTRUCTIVE SLEEP APNEA 12/17/2008   Migraine 12/17/2008   ALLERGIC RHINITIS 12/17/2008   Asthma 12/17/2008    History obtained from: chart review and {Persons; PED relatives w/patient:19415::"patient"}.  [name] was referred by Hoyt Koch, MD.     [name] is a 71 y.o. male presenting for {Blank single:19197::"a food challenge","a drug challenge","skin testing","a sick visit","an evaluation of ***","a follow up visit"}. 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 He does not do much in retirement. His significant other moved to [place].   He was in the ED on November 2nd. He had substantial rash and it scared him. He was pumped full of steroids and Benadryl and he recovered within 1.5 hours. He thinks that the prednisone helped him with his allergies for a period of time. He is off of his allergy medications right now.  He did some lab work and it confirmed that he had alpha gal. Levels show  He does not eat red meats. He avoiding    {Blank single:19197::"Asthma/Respiratory Symptom History: ***"," "}  Allergic Rhinitis Symptom History: He has eye itchiness and SOB. He does lose his voice with migraines.  He apparently takes prednisone 65m 2-3 days in a rown when pollen counts were up. He would do this a few days during the year. Molds and ragweed are bad.   {Blank single:19197::"Food Allergy Symptom History: ***"," "}  {Blank single:19197::"Skin Symptom History: ***"," "}  {Blank single:19197::"GERD Symptom History: ***"," "}  He does have an EpiPen at home due to his yellow jacket history.  ***Otherwise, there is no  history of other atopic diseases, including {Blank multiple:19196:o:"asthma","food allergies","drug allergies","environmental allergies","stinging insect allergies","eczema","urticaria","contact dermatitis"}. There is no significant infectious history. ***Vaccinations are up to date.     Past Medical History: Patient Active Problem List   Diagnosis Date Noted   Allergic reaction 04/16/2022   Amnestic MCI (mild cognitive impairment with memory loss) 03/11/2022   Attention deficit 03/11/2022   Small vessel disease, cerebrovascular 03/11/2022   Ganglion cyst of both wrists 01/04/2022   Ear canal abrasion 01/04/2022   History of aortic dissection 05/28/2020   Elevated LFTs 03/20/2020   Benign prostatic hyperplasia without lower urinary tract symptoms 03/20/2020   Skin lesion of scalp 03/20/2020   Pseudodementia 03/05/2020   Cerebral microvascular disease 03/05/2020   Severe episode of recurrent major depressive disorder, without psychotic features ([place]) 03/05/2020   Coronary artery calcification seen on CAT scan 03/17/2018   Ischemic brain damage 08/17/2017   Encounter for general adult medical examination with abnormal findings 07/18/2015   Thyroid nodule 04/21/2015   AAA (abdominal aortic aneurysm) without rupture ([place]) 04/15/2015   MCI (mild cognitive impairment) 08/26/2014   Peripheral neuropathy 03/21/2013   DM2 (diabetes mellitus, type 2) ([place])    Brachial plexopathy    Anxiety    Occlusion and stenosis of carotid artery without mention of cerebral infarction 04/11/2012   Vitamin D deficiency 12/17/2008   Hyperlipidemia associated with type 2 diabetes mellitus ([place]) 12/17/2008   OBSTRUCTIVE SLEEP APNEA 12/17/2008   Migraine 12/17/2008   ALLERGIC RHINITIS 12/17/2008   Asthma 12/17/2008    Medication List:  Allergies as of 05/25/2022       Reactions   Alpha-gal Anaphylaxis   avoid all beef, pork, venison, rabbit and lamb.  You should also avoid gelatin.   Testosterone Shortness Of Breath   Medication:Androgel Pt states trouble breathing at night   Codeine Nausea And Vomiting   Dapagliflozin Other (See Comments)   Gabapentin    "drunk" feeling   Ultram [tramadol] Nausea And Vomiting   Albuterol Other (See Comments)   Panic attack         Medication List        Accurate as of May 25, 2022  2:03 PM. If you have any questions, ask your nurse or doctor.          ALPRAZolam 1 MG tablet Commonly known as: XANAX Take 0.5 tablets (0.5 mg total) by mouth 4 (four) times daily.   aspirin EC 81 MG tablet Take 81 mg by mouth daily with lunch.   azelastine 0.1 % nasal spray Commonly known as: ASTELIN Place 2 sprays into both nostrils 2 (two) times daily. Office visit is due   bisacodyl 10 MG suppository Commonly known as: Dulcolax Place 1 suppository (10 mg total) rectally daily as needed for moderate constipation.   calcium carbonate 750 MG chewable tablet Commonly known as: TUMS EX Chew 1 tablet by mouth daily as needed for heartburn.   cholecalciferol 1000 units tablet Commonly known as: VITAMIN D Take 2,000 Units by mouth daily.   Comirnaty syringe Generic drug: COVID-19 mRNA vaccine 2023-2024 Inject into the muscle.   EPINEPHrine 0.3 mg/0.3 mL Soaj injection Commonly known as: EPI-PEN INJECT 0.3 MLS INTO THE MUSCLE ONCE AS DIRECTED   fexofenadine 180 MG tablet Commonly known as: ALLEGRA Take 90 mg by mouth 2 (two) times daily.   Fluad Quadrivalent 0.5 ML injection Generic drug: influenza vaccine adjuvanted Inject into the muscle.   fluticasone 50 MCG/ACT nasal spray Commonly known as: FLONASE Place 2  sprays into both nostrils daily.   hydrochlorothiazide 25 MG tablet Commonly known as: HYDRODIURIL Take 1 tablet (25 mg total) by mouth daily. ANNUAL APPT DUE IN OCT MUST SEE PROVIDER FOR FUTURE REFILLS   magic mouthwash (nystatin, hydrocortisone, diphenhydrAMINE) suspension Swish with 65m's by mouth then spit out 2 times a day   metFORMIN 500 MG 24 hr tablet Commonly known as: GLUCOPHAGE-XR Take 3 tablets (1,500 mg total) by mouth daily with breakfast.   metoprolol tartrate 25 MG tablet Commonly known as: LOPRESSOR Take 1 tablet (25 mg total) by mouth 3 (three) times daily.   montelukast  10 MG tablet Commonly known as: SINGULAIR Take 0.5 tablets (5 mg total) by mouth 2 (two) times daily.   multivitamin with minerals Tabs tablet Take 1 tablet by mouth daily.   mupirocin ointment 2 % Commonly known as: BACTROBAN Apply topically 3 (three) times daily.   omega-3 acid ethyl esters 1 g capsule Commonly known as: LOVAZA Take 1 capsule (1 g total) by mouth 2 (two) times daily.   OVER THE COUNTER MEDICATION Apply 1 application topically daily as needed (for acne). Oxy 10 vanishing cream   polyethylene glycol powder 17 GM/SCOOP powder Commonly known as: GLYCOLAX/MIRALAX MIX 17G WITH LIQUID AND DRINK BY MOUTH DAILY.   PreviDent 5000 Dry Mouth 1.1 % Gel dental gel Generic drug: sodium fluoride APPLY THIN RIBBON AND BRUSH FOR 2 MINUTES DAILY IN PLACE OF CONVENTIONAL TOOTHPASTE   Sodium Fluoride 5000 PPM 1.1 % Gel dental gel Generic drug: sodium fluoride Apply thin ribbon/pea-sized amount to toothbrush. Brush teeth thoroughly, for at least 2 min. Use daily in place of conventional toothpaste.   rosuvastatin 10 MG tablet Commonly known as: CRESTOR Take 1 tablet (10 mg total) by mouth daily.   thiamine 100 MG tablet Commonly known as: Vitamin B-1 Take 100 mg by mouth 2 (two) times daily at 10 AM and 5 PM.   TURMERIC CURCUMIN PO Take by mouth.   vitamin B-12 500 MCG tablet Commonly known as: CYANOCOBALAMIN Take 500-1,000 mcg by mouth daily.   ZYRTEC PO Take by mouth.        Birth History: {Blank single:19197::"non-contributory","born premature and spent time in the NICU","born at term without complications"}  Developmental History: CDrequanhas met all milestones on time. He has required no {Blank multiple:19196:a:"speech therapy","occupational therapy","physical therapy"}. ***non-contributory  Past Surgical History: Past Surgical History:  Procedure Laterality Date   ABDOMINAL AORTIC ANEURYSM REPAIR N/A 11/01/2012   Procedure: ANEURYSM ABDOMINAL AORTIC  REPAIR;  Surgeon: JMal Misty MD;  [facility];  Service: Vascular;  Laterality: N/A;  Resection and Grafting of Abdominal Aortic Aneurysm,(AORTA BI ILIAC)   Nasal fx without repair     THORACIC AORTIC ANEURYSM REPAIR  02/20/2012   Procedure: THORACIC ASCENDING ANEURYSM REPAIR (AAA);  Surgeon: PIvin Poot MD;  [facility]  Service: Open Heart Surgery;  Laterality: N/A;     Family History: Family History  Problem Relation Age of Onset   Diabetes Mother    Stroke Mother    Dementia Mother    Deep vein thrombosis Father        PE   Varicose Veins Father    Heart disease Maternal Grandmother    Heart disease Maternal Uncle    Heart disease Paternal Uncle    Heart disease Cousin    Lung cancer Cousin        2 cousins     Social History: Yoltzin lives at home with ***. He  lives in a house that has carpeting throughout the office.    ROS     Objective:   Blood pressure 128/72, pulse 63, temperature 98.7 F (37.1 C), temperature source Temporal, resp. rate 18, height _0  (1.93 m), weight 221 lb 1.6 oz (100.3 kg), SpO2 96 %. Body mass index is 26.91 kg/m.     Physical Exam   Diagnostic studies: {Blank single:19197::"none","deferred due to recent antihistamine use","labs sent instead"," "}  Spirometry: {Blank single:19197::"results normal (FEV1: ***%, FVC: ***%, FEV1/FVC: ***%)","results abnormal (FEV1: ***%, FVC: ***%, FEV1/FVC: ***%)"}.    {Blank single:19197::"Spirometry consistent with mild obstructive disease","Spirometry consistent with moderate obstructive disease","Spirometry consistent with severe obstructive disease","Spirometry consistent with possible restrictive disease","Spirometry consistent with mixed obstructive and restrictive disease","Spirometry uninterpretable due to technique","Spirometry consistent with normal pattern"}. {Blank single:19197::"Albuterol/Atrovent nebulizer","Xopenex/Atrovent nebulizer","Albuterol nebulizer","Albuterol four  puffs via MDI","Xopenex four puffs via MDI"} treatment given in clinic with {Blank single:19197::"significant improvement in FEV1 per ATS criteria","significant improvement in FVC per ATS criteria","significant improvement in FEV1 and FVC per ATS criteria","improvement in FEV1, but not significant per ATS criteria","improvement in FVC, but not significant per ATS criteria","improvement in FEV1 and FVC, but not significant per ATS criteria","no improvement"}.  Allergy Studies: {Blank single:19197::"none","labs sent instead"," "}    {Blank single:19197::"Allergy testing results were read and interpreted by myself, documented by clinical staff."," "}         Salvatore Marvel, MD Allergy and [place] [facility]

## 2022-05-26 ENCOUNTER — Encounter: Payer: Self-pay | Admitting: Allergy & Immunology

## 2022-05-28 ENCOUNTER — Other Ambulatory Visit (HOSPITAL_COMMUNITY): Payer: Self-pay

## 2022-05-28 NOTE — Telephone Encounter (Signed)
Patient called to follow up on these. He only has a few left. He has an appt on 06/22/21

## 2022-05-31 ENCOUNTER — Other Ambulatory Visit (HOSPITAL_COMMUNITY): Payer: Self-pay

## 2022-05-31 ENCOUNTER — Other Ambulatory Visit: Payer: Self-pay

## 2022-05-31 DIAGNOSIS — E1100 Type 2 diabetes mellitus with hyperosmolarity without nonketotic hyperglycemic-hyperosmolar coma (NKHHC): Secondary | ICD-10-CM

## 2022-05-31 DIAGNOSIS — J4520 Mild intermittent asthma, uncomplicated: Secondary | ICD-10-CM

## 2022-05-31 DIAGNOSIS — I10 Essential (primary) hypertension: Secondary | ICD-10-CM

## 2022-05-31 MED ORDER — HYDROCHLOROTHIAZIDE 25 MG PO TABS
25.0000 mg | ORAL_TABLET | Freq: Every day | ORAL | 1 refills | Status: DC
  Filled 2022-05-31: qty 90, 90d supply, fill #0
  Filled 2022-08-25: qty 90, 90d supply, fill #1

## 2022-05-31 MED ORDER — METFORMIN HCL ER 500 MG PO TB24
1500.0000 mg | ORAL_TABLET | Freq: Every day | ORAL | 3 refills | Status: DC
  Filled 2022-05-31: qty 270, 90d supply, fill #0
  Filled 2022-09-09: qty 270, 90d supply, fill #1
  Filled 2022-11-18 (×2): qty 270, 90d supply, fill #2
  Filled 2023-02-11: qty 270, 90d supply, fill #3

## 2022-05-31 MED ORDER — MONTELUKAST SODIUM 10 MG PO TABS
5.0000 mg | ORAL_TABLET | Freq: Two times a day (BID) | ORAL | 1 refills | Status: DC
  Filled 2022-05-31: qty 90, 90d supply, fill #0
  Filled 2022-08-25: qty 90, 90d supply, fill #1

## 2022-05-31 NOTE — Telephone Encounter (Signed)
I was able to speak with the pt and was able to clarify which meds he needed and was able to send in a refill. Pt will be seen on 06/22/2022 for any future refills.

## 2022-06-08 LAB — ALLERGEN PROFILE, MOLD
Aureobasidi Pullulans IgE: <0.1 kU/L
Candida Albicans IgE: <0.1 kU/L
M009-IgE Fusarium proliferatum: <0.1 kU/L
M014-IgE Epicoccum purpur: <0.1 kU/L
Mucor Racemosus IgE: <0.1 kU/L
Phoma Betae IgE: <0.1 kU/L
Setomelanomma Rostrat: <0.1 kU/L
Stemphylium Herbarum IgE: <0.1 kU/L

## 2022-06-08 LAB — ALLERGENS W/COMP RFLX AREA 2
Alternaria Alternata IgE: <0.1 kU/L
Aspergillus Fumigatus IgE: <0.1 kU/L
Bermuda Grass IgE: 0.18 kU/L — AB
Cedar, Mountain IgE: <0.1 kU/L
Cladosporium Herbarum IgE: <0.1 kU/L
Cockroach, German IgE: 0.39 kU/L — AB
Common Silver Birch IgE: <0.1 kU/L
Cottonwood IgE: <0.1 kU/L
D Farinae IgE: 0.39 kU/L — AB
D Pteronyssinus IgE: 0.4 kU/L — AB
E001-IgE Cat Dander: <0.1 kU/L
E005-IgE Dog Dander: <0.1 kU/L
Elm, American IgE: <0.1 kU/L
IgE (Immunoglobulin E), Serum: 34 IU/mL (ref 6–495)
Johnson Grass IgE: <0.1 kU/L
Maple/Box Elder IgE: <0.1 kU/L
Mouse Urine IgE: <0.1 kU/L
Oak, White IgE: <0.1 kU/L
Pecan, Hickory IgE: <0.1 kU/L
Penicillium Chrysogen IgE: <0.1 kU/L
Pigweed, Rough IgE: <0.1 kU/L
Ragweed, Short IgE: <0.1 kU/L
Sheep Sorrel IgE Qn: <0.1 kU/L
Timothy Grass IgE: <0.1 kU/L
White Mulberry IgE: <0.1 kU/L

## 2022-06-08 LAB — ALLERGEN STINGING INSECT PANEL
Honeybee IgE: 0.11 kU/L — AB
Hornet, White Face, IgE: 0.11 kU/L — AB
Hornet, Yellow, IgE: <0.1 kU/L
Paper Wasp IgE: <0.1 kU/L
Yellow Jacket, IgE: <0.1 kU/L

## 2022-06-08 LAB — MILK COMPONENT PANEL
F076-IgE Alpha Lactalbumin: <0.1 kU/L
F077-IgE Beta Lactoglobulin: <0.1 kU/L
F078-IgE Casein: <0.1 kU/L

## 2022-06-08 LAB — ALLERGEN GUM CARAGEENAN
Carageenan Gum, IgE: <0.35 kU/L (ref <0.35)
Class Interpretation: 0

## 2022-06-08 LAB — TRYPTASE: Tryptase: 9.4 ug/L (ref 2.2–13.2)

## 2022-06-15 ENCOUNTER — Other Ambulatory Visit (HOSPITAL_COMMUNITY): Payer: Self-pay

## 2022-06-15 DIAGNOSIS — E119 Type 2 diabetes mellitus without complications: Secondary | ICD-10-CM | POA: Diagnosis not present

## 2022-06-15 DIAGNOSIS — H04123 Dry eye syndrome of bilateral lacrimal glands: Secondary | ICD-10-CM | POA: Diagnosis not present

## 2022-06-15 DIAGNOSIS — H10413 Chronic giant papillary conjunctivitis, bilateral: Secondary | ICD-10-CM | POA: Diagnosis not present

## 2022-06-15 DIAGNOSIS — H401131 Primary open-angle glaucoma, bilateral, mild stage: Secondary | ICD-10-CM | POA: Diagnosis not present

## 2022-06-15 DIAGNOSIS — H25812 Combined forms of age-related cataract, left eye: Secondary | ICD-10-CM | POA: Diagnosis not present

## 2022-06-15 LAB — HM DIABETES EYE EXAM

## 2022-06-17 ENCOUNTER — Other Ambulatory Visit (HOSPITAL_BASED_OUTPATIENT_CLINIC_OR_DEPARTMENT_OTHER): Payer: Self-pay

## 2022-06-17 DIAGNOSIS — F411 Generalized anxiety disorder: Secondary | ICD-10-CM | POA: Diagnosis not present

## 2022-06-21 ENCOUNTER — Other Ambulatory Visit (HOSPITAL_COMMUNITY): Payer: Self-pay

## 2022-06-21 ENCOUNTER — Other Ambulatory Visit: Payer: Self-pay | Admitting: Internal Medicine

## 2022-06-22 ENCOUNTER — Ambulatory Visit (INDEPENDENT_AMBULATORY_CARE_PROVIDER_SITE_OTHER): Payer: PPO | Admitting: Internal Medicine

## 2022-06-22 ENCOUNTER — Other Ambulatory Visit (HOSPITAL_COMMUNITY): Payer: Self-pay

## 2022-06-22 ENCOUNTER — Ambulatory Visit (INDEPENDENT_AMBULATORY_CARE_PROVIDER_SITE_OTHER): Payer: PPO

## 2022-06-22 ENCOUNTER — Encounter: Payer: Self-pay | Admitting: Internal Medicine

## 2022-06-22 VITALS — BP 130/78 | HR 58 | Temp 98.2°F | Ht 76.0 in | Wt 220.6 lb

## 2022-06-22 VITALS — BP 130/78 | HR 58 | Temp 98.2°F | Ht 76.0 in | Wt 220.0 lb

## 2022-06-22 DIAGNOSIS — I714 Abdominal aortic aneurysm, without rupture, unspecified: Secondary | ICD-10-CM | POA: Diagnosis not present

## 2022-06-22 DIAGNOSIS — F332 Major depressive disorder, recurrent severe without psychotic features: Secondary | ICD-10-CM

## 2022-06-22 DIAGNOSIS — E1169 Type 2 diabetes mellitus with other specified complication: Secondary | ICD-10-CM | POA: Diagnosis not present

## 2022-06-22 DIAGNOSIS — R7989 Other specified abnormal findings of blood chemistry: Secondary | ICD-10-CM | POA: Diagnosis not present

## 2022-06-22 DIAGNOSIS — F419 Anxiety disorder, unspecified: Secondary | ICD-10-CM | POA: Diagnosis not present

## 2022-06-22 DIAGNOSIS — Z0000 Encounter for general adult medical examination without abnormal findings: Secondary | ICD-10-CM | POA: Diagnosis not present

## 2022-06-22 DIAGNOSIS — E785 Hyperlipidemia, unspecified: Secondary | ICD-10-CM

## 2022-06-22 DIAGNOSIS — E041 Nontoxic single thyroid nodule: Secondary | ICD-10-CM | POA: Diagnosis not present

## 2022-06-22 DIAGNOSIS — Z0001 Encounter for general adult medical examination with abnormal findings: Secondary | ICD-10-CM

## 2022-06-22 LAB — MICROALBUMIN / CREATININE URINE RATIO
Creatinine,U: 58.4 mg/dL
Microalb Creat Ratio: 3.2 mg/g (ref 0.0–30.0)
Microalb, Ur: 1.9 mg/dL (ref 0.0–1.9)

## 2022-06-22 LAB — LIPID PANEL
Cholesterol: 117 mg/dL (ref 0–200)
HDL: 43.1 mg/dL (ref >39.00)
LDL Cholesterol: 54 mg/dL (ref 0–99)
NonHDL: 73.83
Total CHOL/HDL Ratio: 3
Triglycerides: 100 mg/dL (ref 0.0–149.0)
VLDL: 20 mg/dL (ref 0.0–40.0)

## 2022-06-22 LAB — COMPREHENSIVE METABOLIC PANEL
ALT: 44 U/L (ref 0–53)
AST: 34 U/L (ref 0–37)
Albumin: 4.7 g/dL (ref 3.5–5.2)
Alkaline Phosphatase: 28 U/L — ABNORMAL LOW (ref 39–117)
BUN: 17 mg/dL (ref 6–23)
CO2: 33 mEq/L — ABNORMAL HIGH (ref 19–32)
Calcium: 9.7 mg/dL (ref 8.4–10.5)
Chloride: 98 mEq/L (ref 96–112)
Creatinine, Ser: 0.87 mg/dL (ref 0.40–1.50)
GFR: 88.28 mL/min (ref >60.00)
Glucose, Bld: 150 mg/dL — ABNORMAL HIGH (ref 70–99)
Potassium: 4.2 mEq/L (ref 3.5–5.1)
Sodium: 137 mEq/L (ref 135–145)
Total Bilirubin: 1.3 mg/dL — ABNORMAL HIGH (ref 0.2–1.2)
Total Protein: 7.5 g/dL (ref 6.0–8.3)

## 2022-06-22 LAB — CBC
HCT: 45.9 % (ref 39.0–52.0)
Hemoglobin: 15.7 g/dL (ref 13.0–17.0)
MCHC: 34.3 g/dL (ref 30.0–36.0)
MCV: 92.3 fl (ref 78.0–100.0)
Platelets: 171 10*3/uL (ref 150.0–400.0)
RBC: 4.97 Mil/uL (ref 4.22–5.81)
RDW: 12.9 % (ref 11.5–15.5)
WBC: 7.5 10*3/uL (ref 4.0–10.5)

## 2022-06-22 MED ORDER — ALPRAZOLAM 1 MG PO TABS
0.5000 mg | ORAL_TABLET | Freq: Four times a day (QID) | ORAL | 5 refills | Status: DC
  Filled 2022-06-22: qty 60, 30d supply, fill #0
  Filled 2022-07-26: qty 60, 30d supply, fill #1
  Filled 2022-08-25: qty 60, 30d supply, fill #2
  Filled 2022-09-27: qty 60, 30d supply, fill #3
  Filled 2022-11-01: qty 60, 30d supply, fill #4
  Filled 2022-12-06: qty 60, 30d supply, fill #5

## 2022-06-22 MED ORDER — SODIUM FLUORIDE 1.1 % DT PSTE
PASTE | DENTAL | 3 refills | Status: DC
  Filled 2022-06-22: qty 100, 30d supply, fill #0
  Filled 2022-07-26: qty 100, 30d supply, fill #1

## 2022-06-22 MED ORDER — FLUTICASONE PROPIONATE 50 MCG/ACT NA SUSP
2.0000 | Freq: Every day | NASAL | 2 refills | Status: DC
  Filled 2022-06-22: qty 16, 30d supply, fill #0
  Filled 2022-07-26: qty 16, 30d supply, fill #1
  Filled 2022-08-25: qty 16, 30d supply, fill #2

## 2022-06-22 NOTE — Patient Instructions (Signed)
[name] , Thank you for taking time to come for your Medicare Wellness Visit. I appreciate your ongoing commitment to your health goals. Please review the following plan we discussed and let me know if I can assist you in the future.   These are the goals we discussed:  Goals      My goal for 2024 is to stay alive and get back to exercising.        This is a list of the screening recommended for you and due dates:  Health Maintenance  Topic Date Due   Zoster (Shingles) Vaccine (1 of 2) Never done   Hemoglobin A1C  12/14/2021   Yearly kidney function blood test for diabetes  06/16/2022   Yearly kidney health urinalysis for diabetes  06/16/2022   Complete foot exam   06/19/2022   Cologuard (Stool DNA test)  11/12/2022   Eye exam for diabetics  06/16/2023   Medicare Annual Wellness Visit  06/23/2023   DTaP/Tdap/Td vaccine (6 - Td or Tdap) 02/26/2027   Pneumonia Vaccine  Completed   Flu Shot  Completed   COVID-19 Vaccine  Completed   Hepatitis C Screening: USPSTF Recommendation to screen - Ages 39-79 yo.  Completed   HPV Vaccine  Aged Out   Colon Cancer Screening  Discontinued    Advanced directives: Yes; needs updating.  Conditions/risks identified: Yes  Next appointment: Follow up in one year for your annual wellness visit.   Preventive Care 33 Years and Older, Male  Preventive care refers to lifestyle choices and visits with your health care provider that can promote health and wellness. What does preventive care include? A yearly physical exam. This is also called an annual well check. Dental exams once or twice a year. Routine eye exams. Ask your health care provider how often you should have your eyes checked. Personal lifestyle choices, including: Daily care of your teeth and gums. Regular physical activity. Eating a healthy diet. Avoiding tobacco and drug use. Limiting alcohol use. Practicing safe sex. Taking low doses of aspirin every day. Taking vitamin and  mineral supplements as recommended by your health care provider. What happens during an annual well check? The services and screenings done by your health care provider during your annual well check will depend on your age, overall health, lifestyle risk factors, and family history of disease. Counseling  Your health care provider may ask you questions about your: Alcohol use. Tobacco use. Drug use. Emotional well-being. Home and relationship well-being. Sexual activity. Eating habits. History of falls. Memory and ability to understand (cognition). Work and work Statistician. Screening  You may have the following tests or measurements: Height, weight, and BMI. Blood pressure. Lipid and cholesterol levels. These may be checked every 5 years, or more frequently if you are over 12 years old. Skin check. Lung cancer screening. You may have this screening every year starting at age 13 if you have a 30-pack-year history of smoking and currently smoke or have quit within the past 15 years. Fecal occult blood test (FOBT) of the stool. You may have this test every year starting at age 70. Flexible sigmoidoscopy or colonoscopy. You may have a sigmoidoscopy every 5 years or a colonoscopy every 10 years starting at age 44. Prostate cancer screening. Recommendations will vary depending on your family history and other risks. Hepatitis C blood test. Hepatitis B blood test. Sexually transmitted disease (STD) testing. Diabetes screening. This is done by checking your blood sugar (glucose) after you have not  eaten for a while (fasting). You may have this done every 1-3 years. Abdominal aortic aneurysm (AAA) screening. You may need this if you are a current or former smoker. Osteoporosis. You may be screened starting at age 47 if you are at high risk. Talk with your health care provider about your test results, treatment options, and if necessary, the need for more tests. Vaccines  Your health care  provider may recommend certain vaccines, such as: Influenza vaccine. This is recommended every year. Tetanus, diphtheria, and acellular pertussis (Tdap, Td) vaccine. You may need a Td booster every 10 years. Zoster vaccine. You may need this after age 100. Pneumococcal 13-valent conjugate (PCV13) vaccine. One dose is recommended after age 76. Pneumococcal polysaccharide (PPSV23) vaccine. One dose is recommended after age 81. Talk to your health care provider about which screenings and vaccines you need and how often you need them. This information is not intended to replace advice given to you by your health care provider. Make sure you discuss any questions you have with your health care provider. Document Released: 06/27/2015 Document Revised: 02/18/2016 Document Reviewed: 04/01/2015 Elsevier Interactive Patient Education  2017 [place] Prevention in the Home Falls can cause injuries. They can happen to people of all ages. There are many things you can do to make your home safe and to help prevent falls. What can I do on the outside of my home? Regularly fix the edges of walkways and driveways and fix any cracks. Remove anything that might make you trip as you walk through a door, such as a raised step or threshold. Trim any bushes or trees on the path to your home. Use bright outdoor lighting. Clear any walking paths of anything that might make someone trip, such as rocks or tools. Regularly check to see if handrails are loose or broken. Make sure that both sides of any steps have handrails. Any raised decks and porches should have guardrails on the edges. Have any leaves, snow, or ice cleared regularly. Use sand or salt on walking paths during winter. Clean up any spills in your garage right away. This includes oil or grease spills. What can I do in the bathroom? Use night lights. Install grab bars by the toilet and in the tub and shower. Do not use towel bars as grab  bars. Use non-skid mats or decals in the tub or shower. If you need to sit down in the shower, use a plastic, non-slip stool. Keep the floor dry. Clean up any water that spills on the floor as soon as it happens. Remove soap buildup in the tub or shower regularly. Attach bath mats securely with double-sided non-slip rug tape. Do not have throw rugs and other things on the floor that can make you trip. What can I do in the bedroom? Use night lights. Make sure that you have a light by your bed that is easy to reach. Do not use any sheets or blankets that are too big for your bed. They should not hang down onto the floor. Have a firm chair that has side arms. You can use this for support while you get dressed. Do not have throw rugs and other things on the floor that can make you trip. What can I do in the kitchen? Clean up any spills right away. Avoid walking on wet floors. Keep items that you use a lot in easy-to-reach places. If you need to reach something above you, use a strong step stool that  has a grab bar. Keep electrical cords out of the way. Do not use floor polish or wax that makes floors slippery. If you must use wax, use non-skid floor wax. Do not have throw rugs and other things on the floor that can make you trip. What can I do with my stairs? Do not leave any items on the stairs. Make sure that there are handrails on both sides of the stairs and use them. Fix handrails that are broken or loose. Make sure that handrails are as long as the stairways. Check any carpeting to make sure that it is firmly attached to the stairs. Fix any carpet that is loose or worn. Avoid having throw rugs at the top or bottom of the stairs. If you do have throw rugs, attach them to the floor with carpet tape. Make sure that you have a light switch at the top of the stairs and the bottom of the stairs. If you do not have them, ask someone to add them for you. What else can I do to help prevent  falls? Wear shoes that: Do not have high heels. Have rubber bottoms. Are comfortable and fit you well. Are closed at the toe. Do not wear sandals. If you use a stepladder: Make sure that it is fully opened. Do not climb a closed stepladder. Make sure that both sides of the stepladder are locked into place. Ask someone to hold it for you, if possible. Clearly mark and make sure that you can see: Any grab bars or handrails. First and last steps. Where the edge of each step is. Use tools that help you move around (mobility aids) if they are needed. These include: Canes. Walkers. Scooters. Crutches. Turn on the lights when you go into a dark area. Replace any light bulbs as soon as they burn out. Set up your furniture so you have a clear path. Avoid moving your furniture around. If any of your floors are uneven, fix them. If there are any pets around you, be aware of where they are. Review your medicines with your doctor. Some medicines can make you feel dizzy. This can increase your chance of falling. Ask your doctor what other things that you can do to help prevent falls. This information is not intended to replace advice given to you by your health care provider. Make sure you discuss any questions you have with your health care provider. Document Released: 03/27/2009 Document Revised: 11/06/2015 Document Reviewed: 07/05/2014 Elsevier Interactive Patient Education  2017 [employer].

## 2022-06-22 NOTE — Progress Notes (Signed)
Subjective:   [name] is a 72 y.o. male who presents for Medicare Annual/Subsequent preventive examination.  Review of Systems     Cardiac Risk Factors include: advanced age (>47mn, >>15women);diabetes mellitus;hypertension;male gender;sedentary lifestyle;family history of premature cardiovascular disease     Objective:    Today's Vitals   06/22/22 1330  BP: 130/78  Pulse: (!) 58  Temp: 98.2 F (36.8 C)  TempSrc: Temporal  SpO2: 98%  Weight: 220 lb 9.6 oz (100.1 kg)  Height: '6\' 4"'$  (1.93 m)  PainSc: 0-No pain   Body mass index is 26.85 kg/m.     06/22/2022    1:45 PM 04/15/2022    4:48 AM 04/04/2020    3:03 PM 11/15/2019    8:44 PM 09/08/2019   10:53 AM 06/21/2018   11:40 AM 06/06/2018   10:39 AM  Advanced Directives  Does Patient Have a Medical Advance Directive? No No No No No No   Type of Advance Directive         Would patient like information on creating a medical advance directive? No - Patient declined  No - Patient declined Yes (MAU/Ambulatory/Procedural Areas - Information given) Yes (MAU/Ambulatory/Procedural Areas - Information given) No - Patient declined      Information is confidential and restricted. Go to Review Flowsheets to unlock data.    Current Medications (verified) Outpatient Encounter Medications as of 06/22/2022  Medication Sig   ALPRAZolam (XANAX) 1 MG tablet Take 0.5 tablets (0.5 mg total) by mouth 4 (four) times daily.   aspirin EC 81 MG tablet Take 81 mg by mouth daily with lunch.   azelastine (ASTELIN) 0.1 % nasal spray Place 2 sprays into both nostrils 2 (two) times daily. Office visit is due   bisacodyl (DULCOLAX) 10 MG suppository Place 1 suppository (10 mg total) rectally daily as needed for moderate constipation.   calcium carbonate (TUMS EX) 750 MG chewable tablet Chew 1 tablet by mouth daily as needed for heartburn.    Cetirizine HCl (ZYRTEC PO) Take by mouth.   cholecalciferol (VITAMIN D) 1000 UNITS tablet Take 2,000 Units by  mouth daily.   COVID-19 mRNA vaccine 2023-2024 (COMIRNATY) syringe Inject into the muscle.   EPINEPHrine 0.3 mg/0.3 mL IJ SOAJ injection INJECT 0.3 MLS INTO THE MUSCLE ONCE AS DIRECTED   fexofenadine (ALLEGRA) 180 MG tablet Take 90 mg by mouth 2 (two) times daily.   fluticasone (FLONASE) 50 MCG/ACT nasal spray Place 2 sprays into both nostrils daily.   hydrochlorothiazide (HYDRODIURIL) 25 MG tablet Take 1 tablet (25 mg total) by mouth daily. ANNUAL APPT DUE IN OCT MUST SEE PROVIDER FOR FUTURE REFILLS   influenza vaccine adjuvanted (FLUAD) 0.5 ML injection Inject into the muscle.   magic mouthwash (nystatin, hydrocortisone, diphenhydrAMINE) suspension Swish with 581ms by mouth then spit out 2 times a day (Patient not taking: Reported on 05/25/2022)   metFORMIN (GLUCOPHAGE-XR) 500 MG 24 hr tablet Take 3 tablets (1,500 mg total) by mouth daily with breakfast.   metoprolol tartrate (LOPRESSOR) 25 MG tablet Take 1 tablet (25 mg total) by mouth 3 (three) times daily.   montelukast (SINGULAIR) 10 MG tablet Take 1/2 tablet (5 mg total) by mouth 2 (two) times daily.   Multiple Vitamin (MULTIVITAMIN WITH MINERALS) TABS Take 1 tablet by mouth daily.   mupirocin ointment (BACTROBAN) 2 % Apply topically 3 (three) times daily. (Patient not taking: Reported on 05/25/2022)   omega-3 acid ethyl esters (LOVAZA) 1 g capsule Take 1 capsule (1 g total)  by mouth 2 (two) times daily.   OVER THE COUNTER MEDICATION Apply 1 application topically daily as needed (for acne). Oxy 10 vanishing cream   polyethylene glycol powder (GLYCOLAX/MIRALAX) powder MIX 17G WITH LIQUID AND DRINK BY MOUTH DAILY.   PREVIDENT 5000 DRY MOUTH 1.1 % GEL dental gel APPLY THIN RIBBON AND BRUSH FOR 2 MINUTES DAILY IN PLACE OF CONVENTIONAL TOOTHPASTE   rosuvastatin (CRESTOR) 10 MG tablet Take 1 tablet (10 mg total) by mouth daily.   sodium fluoride (PREVIDENT) 1.1 % GEL dental gel Apply thin ribbon/pea-sized amount to toothbrush. Brush teeth  thoroughly, for at least 2 min. Use daily in place of conventional toothpaste.   Sodium Fluoride 1.1 % PSTE Apply a thin ribbon/pea-sized amount to toothbrush. Brush teeth thoroughly, for at least 2 mintues. Use daily in place of conventional toothpaste. Expectorate Donot swallow   thiamine (VITAMIN B-1) 100 MG tablet Take 100 mg by mouth 2 (two) times daily at 10 AM and 5 PM.    TURMERIC CURCUMIN PO Take by mouth.   vitamin B-12 (CYANOCOBALAMIN) 500 MCG tablet Take 500-1,000 mcg by mouth daily.    [DISCONTINUED] oxyCODONE (OXY IR/ROXICODONE) 5 MG immediate release tablet Take 1 tablet (5 mg total) by mouth every 4 (four) hours as needed.   No facility-administered encounter medications on file as of 06/22/2022.    Allergies (verified) Alpha-gal, Testosterone, Codeine, Dapagliflozin, Gabapentin, Ultram [tramadol], and Albuterol   History: Past Medical History:  Diagnosis Date   AAA (abdominal aortic aneurysm) (HCC)    ALLERGIC RHINITIS    Anemia    post op, treated /w Fe   Anxiety    Arthritis    OA, hands, - thumbs- specifically   Brachial plexopathy    double crush injury with median nerve damage on the right dominant hand.    COPD (chronic obstructive pulmonary disease) ([place])    also states he has been told that he has slight emphysema    Dissecting aortic aneurysm, thoracic ([place]) 02/20/2012   s/p emergent surgical repair   DM2 (diabetes mellitus, type 2) ([place])    DVT (deep venous thrombosis) (HCC)    coumadin- x3 months , post op   ERECTILE DYSFUNCTION, ORGANIC    GERD (gastroesophageal reflux disease)    related to diet & anxiety    HYPERLIPIDEMIA    Hypertension    pt. followwed by Dr. Johnsie Cancel   MIGRAINE HEADACHE    induced from physical , emotional stress , but sometimes occur randomly, also experiences BPPV   OBSTRUCTIVE SLEEP APNEA 02/2008 sleep study   moderate & central- not able to tolerate CPAP    Thyroid nodule 04/21/2015   2.3 cm on right, incidental finding on  03/2015 CT chest - f/u US ordered   Unspecified vitamin D deficiency    Past Surgical History:  Procedure Laterality Date   ABDOMINAL AORTIC ANEURYSM REPAIR N/A 11/01/2012   Procedure: ANEURYSM ABDOMINAL AORTIC REPAIR;  Surgeon: Mal Misty, MD;  [facility];  Service: Vascular;  Laterality: N/A;  Resection and Grafting of Abdominal Aortic Aneurysm,(AORTA BI ILIAC)   Nasal fx without repair     THORACIC AORTIC ANEURYSM REPAIR  02/20/2012   Procedure: THORACIC ASCENDING ANEURYSM REPAIR (AAA);  Surgeon: Ivin Poot, MD;  Location: [facility];  Service: Open Heart Surgery;  Laterality: N/A;   Family History  Problem Relation Age of Onset   Diabetes Mother    Stroke Mother    Dementia Mother    Deep vein thrombosis  Father        PE   Varicose Veins Father    Heart disease Maternal Grandmother    Heart disease Maternal Uncle    Heart disease Paternal Uncle    Heart disease Cousin    Lung cancer Cousin        2 cousins   Social History   Socioeconomic History   Marital status: Divorced    Spouse name: Not on file   Number of children: 2   Years of education: acad. deg.   Highest education level: Not on file  Occupational History   Occupation: internal med resident    Employer: [employer]  Tobacco Use   Smoking status: Former    Packs/day: 0.10    Types: Cigarettes    Quit date: 02/18/2012    Years since quitting: 10.3   Smokeless tobacco: Never   Tobacco comments:    10 cigs/day, quit 02/2012 after TAA emergent repair  Vaping Use   Vaping Use: Never used  Substance and Sexual Activity   Alcohol use: Yes    Comment: occasional   Drug use: No   Sexual activity: Not on file  Other Topics Concern   Not on file  Social History Narrative   Patient is single and lives at home alone.    Arboriculturist.   Patient works at [employer] full time.   Right handed.   Caffeine one cup of coffee daily.    Social Determinants of Health   Financial Resource Strain: Low Risk   (06/22/2022)   Overall Financial Resource Strain (CARDIA)    Difficulty of Paying Living Expenses: Not hard at all  Food Insecurity: No Food Insecurity (06/22/2022)   Hunger Vital Sign    Worried About Running Out of Food in the Last Year: Never true    Ran Out of Food in the Last Year: Never true  Transportation Needs: No Transportation Needs (06/22/2022)   PRAPARE - Hydrologist (Medical): No    Lack of Transportation (Non-Medical): No  Physical Activity: Inactive (06/22/2022)   Exercise Vital Sign    Days of Exercise per Week: 0 days    Minutes of Exercise per Session: 0 min  Stress: No Stress Concern Present (06/22/2022)   [place]    Feeling of Stress : Not at all  Social Connections: Socially Isolated (06/22/2022)   Social Connection and Isolation Panel [NHANES]    Frequency of Communication with Friends and Family: Once a week    Frequency of Social Gatherings with Friends and Family: Once a week    Attends Religious Services: 1 to 4 times per year    Active Member of [employer] or Organizations: No    Attends Archivist Meetings: Never    Marital Status: Divorced    Tobacco Counseling Counseling given: Not Answered Tobacco comments: 10 cigs/day, quit 02/2012 after TAA emergent repair   Clinical Intake:  Pre-visit preparation completed: Yes  Pain : No/denies pain Pain Score: 0-No pain     Nutritional Risks: None Diabetes: No  How often do you need to have someone help you when you read instructions, pamphlets, or other written materials from your doctor or pharmacy?: 1 - Never What is the last grade level you completed in school?: HSG  Nutrition Risk Assessment:  Has the patient had any N/V/D within the last 2 months?  No  Does the patient have any non-healing wounds?  No  Has the patient had any unintentional weight loss or weight gain?  No   Diabetes:  Is the  patient diabetic?  Yes  If diabetic, was a CBG obtained today?  No  Did the patient bring in their glucometer from home?  No  How often do you monitor your CBG's? No.   Financial Strains and Diabetes Management:  Are you having any financial strains with the device, your supplies or your medication? No .  Does the patient want to be seen by Chronic Care Management for management of their diabetes?  No  Would the patient like to be referred to a Nutritionist or for Diabetic Management?  No   Diabetic Exams:  Diabetic Eye Exam: Completed 06/15/2022 Diabetic Foot Exam: Completed 06/22/2022   Interpreter Needed?: No  Information entered by :: Lisette Abu, LPN.   Activities of Daily Living    06/22/2022    2:43 PM  In your present state of health, do you have any difficulty performing the following activities:  Hearing? 0  Vision? 0  Difficulty concentrating or making decisions? 0  Walking or climbing stairs? 0  Dressing or bathing? 0  Doing errands, shopping? 0  Preparing Food and eating ? N  Using the Toilet? N  In the past six months, have you accidently leaked urine? N  Do you have problems with loss of bowel control? N  Managing your Medications? N  Managing your Finances? N  Housekeeping or managing your Housekeeping? N    Patient Care Team: Hoyt Koch, MD as PCP - General (Internal Medicine) Josue Hector, MD as PCP - Cardiology (Cardiology) Dahlia Byes, MD (Cardiothoracic Surgery) Pennie Banter, [place] (Pharmacist) Thurman Coyer, DO (Sports Medicine) Dohmeier, Asencion Partridge, MD (Neurology) Charlton Haws, [facility] as Pharmacist (Pharmacist)  Indicate any recent Medical Services you may have received from other than [facility] providers in the past year (date may be approximate).     Assessment:   This is a routine wellness examination for [place].  Hearing/Vision screen Hearing Screening - Comments:: Denies hearing difficulties   Vision  Screening - Comments:: Wears rx glasses - up to date with routine eye exams with [facility]   Dietary issues and exercise activities discussed: Current Exercise Habits: The patient does not participate in regular exercise at present, Exercise limited by: respiratory conditions(s);psychological condition(s);cardiac condition(s)   Goals Addressed             This Visit's Progress    My goal for 2024 is to stay alive and get back to exercising.        Depression Screen    06/22/2022    2:17 PM 04/16/2022   10:56 AM 01/04/2022    3:32 PM 06/16/2021    3:41 PM 11/04/2020   11:53 AM 07/23/2020    1:46 PM 07/22/2020    3:48 PM  PHQ 2/9 Scores  PHQ - 2 Score 0 0 0 0 2 6 0  PHQ- 9 Score 0 2 0 0 12 19 0    Fall Risk    06/22/2022    2:17 PM 04/16/2022   10:56 AM 06/16/2021    3:41 PM 04/04/2020    3:02 PM 09/19/2019   11:01 AM  [place] in the past year? 0 0 0 0 0  Number falls in past yr: 0 0 0  0  Injury with Fall? 0 0 0  0  Risk for fall due to :  No Fall Risks  No Fall Risks   Follow up Falls evaluation completed Falls evaluation completed       [place]:  Any stairs in or around the home? Yes  If so, are there any without handrails? No  Home free of loose throw rugs in walkways, pet beds, electrical cords, etc? Yes  Adequate lighting in your home to reduce risk of falls? Yes   ASSISTIVE DEVICES UTILIZED TO PREVENT FALLS:  Life alert? No  Use of a cane, walker or w/c? No  Grab bars in the bathroom? Yes  Shower chair or bench in shower? Yes  Elevated toilet seat or a handicapped toilet? No   TIMED UP AND GO:  Was the test performed? Yes .  Length of time to ambulate 10 feet: 8 sec.   Gait steady and fast without use of assistive device  Cognitive Function:    03/06/2019    2:56 PM  MMSE - Mini Mental State Exam  Orientation to time   Orientation to Place   Registration   Attention/ Calculation   Recall   Language-  name 2 objects   Language- repeat   Language- follow 3 step command   Language- read & follow direction   Write a sentence   Copy design   Total score      Information is confidential and restricted. Go to Review Flowsheets to unlock data.      03/11/2022    3:41 PM 03/11/2021    2:44 PM 03/05/2020    3:50 PM 08/17/2017    3:37 PM 08/11/2016    3:46 PM  Montreal Cognitive Assessment   Visuospatial/ Executive (0/5) '5 4 5    '$ Naming (0/3) '3 3 3    '$ Attention: Read list of digits (0/2) '2 2 2    '$ Attention: Read list of letters (0/1) '1 1 1    '$ Attention: Serial 7 subtraction starting at 100 (0/3) '3 3 3    '$ Language: Repeat phrase (0/2) '2 2 1    '$ Language : Fluency (0/1) '1 1 1    '$ Abstraction (0/2) '2 2 2    '$ Delayed Recall (0/5) '5 4 4    '$ Orientation (0/6) '6 6 6    '$ Total '30 28 28    '$ Adjusted Score (based on education)          Information is confidential and restricted. Go to Review Flowsheets to unlock data.      06/22/2022    2:44 PM  6CIT Screen  What Year? 0 points  What month? 0 points  What time? 0 points  Count back from 20 0 points  Months in reverse 0 points  Repeat phrase 0 points  Total Score 0 points    Immunizations Immunization History  Administered Date(s) Administered   COVID-19, mRNA, vaccine(Comirnaty)12 years and older 04/07/2022   DTP 06/14/1976, 04/22/2003, 02/27/2006   Fluad Quad(high Dose 65+) 03/28/2019, 03/20/2020, 03/27/2021, 04/07/2022   H1N1 04/25/2008   Hepatitis A, Adult 01/28/2016, 08/18/2016   Hepatitis B 11/03/1990, 12/13/1990, 04/25/1991   Influenza Split 03/15/2017, 03/27/2019   Influenza Whole 03/22/2008, 03/28/2014   Influenza-Unspecified 04/10/2012, 03/06/2015, 03/06/2018   MMR 04/28/2006, 05/10/2006   PFIZER Comirnaty(Gray Top)Covid-19 Tri-Sucrose Vaccine 10/08/2020   PFIZER(Purple Top)SARS-COV-2 Vaccination 06/28/2019, 07/17/2019, 05/07/2020   Pfizer Covid-19 Vaccine Bivalent Booster 59yr & up 04/15/2021   Pneumococcal Conjugate-13  03/20/2020   Pneumococcal Polysaccharide-23 02/11/2011, 03/19/2019   Td 02/27/2006   Tdap 02/25/2017   Typhoid Inactivated  01/28/2016   Varicella 06/16/1994, 04/28/2006   Zoster, Live 12/12/2013    TDAP status: Up to date  Flu Vaccine status: Up to date  Pneumococcal vaccine status: Up to date  Covid-19 vaccine status: Completed vaccines  Qualifies for Shingles Vaccine? Yes   Zostavax completed Yes   Shingrix Completed?: No.    Education has been provided regarding the importance of this vaccine. Patient has been advised to call insurance company to determine out of pocket expense if they have not yet received this vaccine. Advised may also receive vaccine at local pharmacy or Health Dept. Verbalized acceptance and understanding.  Screening Tests Health Maintenance  Topic Date Due   Zoster Vaccines- Shingrix (1 of 2) Never done   HEMOGLOBIN A1C  12/14/2021   Diabetic kidney evaluation - eGFR measurement  06/16/2022   Diabetic kidney evaluation - Urine ACR  06/16/2022   Fecal DNA (Cologuard)  11/12/2022   OPHTHALMOLOGY EXAM  06/16/2023   FOOT EXAM  06/23/2023   Medicare Annual Wellness (AWV)  06/23/2023   DTaP/Tdap/Td (6 - Td or Tdap) 02/26/2027   Pneumonia Vaccine 54+ Years old  Completed   INFLUENZA VACCINE  Completed   COVID-19 Vaccine  Completed   Hepatitis C Screening  Completed   HPV VACCINES  Aged Out   COLONOSCOPY (Pts 45-62yr Insurance coverage will need to be confirmed)  Discontinued    Health Maintenance  Health Maintenance Due  Topic Date Due   Zoster Vaccines- Shingrix (1 of 2) Never done   HEMOGLOBIN A1C  12/14/2021   Diabetic kidney evaluation - eGFR measurement  06/16/2022   Diabetic kidney evaluation - Urine ACR  06/16/2022    Colorectal cancer screening: Type of screening: Cologuard. Completed 11/16/2019. Repeat every 3 years  Lung Cancer Screening: (Low Dose CT Chest recommended if Age 516-80years, 30 pack-year currently smoking OR have quit w/in  15years.) does not qualify.   Lung Cancer Screening Referral: no  Additional Screening:  Hepatitis C Screening: does qualify; Completed 03/20/2020  Vision Screening: Recommended annual ophthalmology exams for early detection of glaucoma and other disorders of the eye. Is the patient up to date with their annual eye exam?  Yes  Who is the provider or what is the name of the office in which the patient attends annual eye exams? [facility] If pt is not established with a provider, would they like to be referred to a provider to establish care? No .   Dental Screening: Recommended annual dental exams for proper oral hygiene  Community Resource Referral / Chronic Care Management: CRR required this visit?  No   CCM required this visit?  No      Plan:     I have personally reviewed and noted the following in the patient's chart:   Medical and social history Use of alcohol, tobacco or illicit drugs  Current medications and supplements including opioid prescriptions. Patient is not currently taking opioid prescriptions. Functional ability and status Nutritional status Physical activity Advanced directives List of other physicians Hospitalizations, surgeries, and ER visits in previous 12 months Vitals Screenings to include cognitive, depression, and falls Referrals and appointments  In addition, I have reviewed and discussed with patient certain preventive protocols, quality metrics, and best practice recommendations. A written personalized care plan for preventive services as well as general preventive health recommendations were provided to patient.     SSheral Flow LPN   17/02/239  Nurse Notes: Normal cognitive status assessed by direct observation by  this Nurse Health Advisor. No abnormalities found.

## 2022-06-22 NOTE — Progress Notes (Unsigned)
   Subjective:   Patient ID: [name], male    DOB: 01/25/1951, 72 y.o.   MRN: [id number]  HPI The patient is here for physical.  PMH, [facility], social history reviewed and updated  Review of Systems  Objective:  Physical Exam  Vitals:   06/22/22 1413  BP: 130/78  Pulse: (!) 58  Temp: 98.2 F (36.8 C)  TempSrc: Oral  SpO2: 98%  Weight: 220 lb (99.8 kg)  Height: '6\' 4"'$  (1.93 m)    Assessment & Plan:

## 2022-06-24 DIAGNOSIS — H2512 Age-related nuclear cataract, left eye: Secondary | ICD-10-CM | POA: Diagnosis not present

## 2022-06-24 NOTE — Assessment & Plan Note (Signed)
Getting repeat imaging later this month and is doing yearly imaging. No symptoms to suggest change or rupture today.

## 2022-06-24 NOTE — Assessment & Plan Note (Signed)
Checking CMP.  

## 2022-06-24 NOTE — Assessment & Plan Note (Signed)
Seeing endo for management Dr Buddy Duty.

## 2022-06-24 NOTE — Assessment & Plan Note (Signed)
Flu shot up to date. Covid-19 up to date. Pneumonia complete. Shingrix complete. Tetanus up to date. Colonoscopy up to date. Counseled about sun safety and mole surveillance. Counseled about the dangers of distracted driving. Given 10 year screening recommendations.

## 2022-06-24 NOTE — Assessment & Plan Note (Signed)
Checking CMP and lipid panel and microalbumin to creatinine ratio. HgA1c 7.7 this fall with endo and endo is managing sugars as needed. We have managed in the past.

## 2022-06-24 NOTE — Assessment & Plan Note (Signed)
Overall is stable but social support is limited. Using alprazolam 0.5 mg QID prn and can refill as needed.

## 2022-06-24 NOTE — Assessment & Plan Note (Signed)
Checking lipid panel and adjust crestor 10 mg daily as needed. 

## 2022-06-24 NOTE — Assessment & Plan Note (Signed)
No meds at this time due to poor tolerance in the past. Doing okay but we talked about increasing activities to help.

## 2022-07-04 DIAGNOSIS — H2511 Age-related nuclear cataract, right eye: Secondary | ICD-10-CM | POA: Diagnosis not present

## 2022-07-08 DIAGNOSIS — H2511 Age-related nuclear cataract, right eye: Secondary | ICD-10-CM | POA: Diagnosis not present

## 2022-07-11 ENCOUNTER — Encounter: Payer: Self-pay | Admitting: Allergy & Immunology

## 2022-07-14 ENCOUNTER — Ambulatory Visit: Payer: PPO | Admitting: Surgery

## 2022-07-14 ENCOUNTER — Ambulatory Visit: Payer: HMO | Admitting: Surgery

## 2022-07-14 ENCOUNTER — Ambulatory Visit
Admission: RE | Admit: 2022-07-14 | Discharge: 2022-07-14 | Disposition: A | Discharge status: Home / Self Care | Payer: PPO | Source: Ambulatory Visit | Attending: Surgery | Admitting: Surgery

## 2022-07-14 DIAGNOSIS — I714 Abdominal aortic aneurysm, without rupture, unspecified: Secondary | ICD-10-CM | POA: Diagnosis not present

## 2022-07-14 DIAGNOSIS — I2510 Atherosclerotic heart disease of native coronary artery without angina pectoris: Secondary | ICD-10-CM | POA: Diagnosis not present

## 2022-07-14 DIAGNOSIS — J432 Centrilobular emphysema: Secondary | ICD-10-CM | POA: Diagnosis not present

## 2022-07-14 DIAGNOSIS — J8410 Pulmonary fibrosis, unspecified: Secondary | ICD-10-CM | POA: Diagnosis not present

## 2022-07-14 MED ORDER — IOPAMIDOL (ISOVUE-370) INJECTION 76%
75.0000 mL | Freq: Once | INTRAVENOUS | Status: AC | PRN
  Administered 2022-07-14: 75 mL via INTRAVENOUS

## 2022-07-15 ENCOUNTER — Ambulatory Visit: Payer: PPO | Admitting: Surgery

## 2022-07-15 ENCOUNTER — Encounter: Payer: Self-pay | Admitting: Surgery

## 2022-07-15 VITALS — BP 119/66 | HR 56 | Resp 20 | Ht 76.0 in | Wt 220.0 lb

## 2022-07-15 DIAGNOSIS — Z8679 Personal history of other diseases of the circulatory system: Secondary | ICD-10-CM

## 2022-07-15 DIAGNOSIS — F411 Generalized anxiety disorder: Secondary | ICD-10-CM | POA: Diagnosis not present

## 2022-07-15 NOTE — Progress Notes (Signed)
HPI:  The patient is a 72 year old gentleman with a history of diabetes, hypertension, hyperlipidemia who was followed by Dr. Prescott Gum after undergoing emergency repair of a type A aortic dissection with resuspension of the aortic valve in 2013.  He subsequently underwent aortobifemoral bypass grafting in 2014 for an abdominal aortic aneurysm.  He is followed by Dr. Johnsie Cancel for his cardiology care.  His last echocardiogram on 03/12/2022 showed moderate aortic insufficiency with a pressure half-time of 499 ms.  The aortic valve mean gradient was 11.0 with a peak gradient of 20.4.  Left ventricular ejection fraction was 54%.  Left ventricular diastolic dimension was 5.2 cm.  These measurements were not significantly different from the year prior.  He continues to feel well overall.  He denies any chest pain or shortness of breath.  Current Outpatient Medications  Medication Sig Dispense Refill   ALPRAZolam (XANAX) 1 MG tablet Take 0.5 tablets (0.5 mg total) by mouth 4 (four) times daily. 60 tablet 5   aspirin EC 81 MG tablet Take 81 mg by mouth daily with lunch.     azelastine (ASTELIN) 0.1 % nasal spray Place 2 sprays into both nostrils 2 (two) times daily. Office visit is due 30 mL 0   bisacodyl (DULCOLAX) 10 MG suppository Place 1 suppository (10 mg total) rectally daily as needed for moderate constipation. 12 suppository 0   calcium carbonate (TUMS EX) 750 MG chewable tablet Chew 1 tablet by mouth daily as needed for heartburn.      Cetirizine HCl (ZYRTEC PO) Take by mouth.     cholecalciferol (VITAMIN D) 1000 UNITS tablet Take 2,000 Units by mouth daily.     EPINEPHrine 0.3 mg/0.3 mL IJ SOAJ injection INJECT 0.3 MLS INTO THE MUSCLE ONCE AS DIRECTED 2 each 3   fexofenadine (ALLEGRA) 180 MG tablet Take 90 mg by mouth 2 (two) times daily.     fluticasone (FLONASE) 50 MCG/ACT nasal spray Place 2 sprays into both nostrils daily. 16 g 2   hydrochlorothiazide (HYDRODIURIL) 25 MG tablet Take 1 tablet  (25 mg total) by mouth daily. ANNUAL APPT DUE IN OCT MUST SEE PROVIDER FOR FUTURE REFILLS 90 tablet 1   magic mouthwash (nystatin, hydrocortisone, diphenhydrAMINE) suspension Swish with 24m's by mouth then spit out 2 times a day 140 mL 0   metFORMIN (GLUCOPHAGE-XR) 500 MG 24 hr tablet Take 3 tablets (1,500 mg total) by mouth daily with breakfast. 270 tablet 3   metoprolol tartrate (LOPRESSOR) 25 MG tablet Take 1 tablet (25 mg total) by mouth 3 (three) times daily. 270 tablet 2   montelukast (SINGULAIR) 10 MG tablet Take 1/2 tablet (5 mg total) by mouth 2 (two) times daily. 90 tablet 1   Multiple Vitamin (MULTIVITAMIN WITH MINERALS) TABS Take 1 tablet by mouth daily.     mupirocin ointment (BACTROBAN) 2 % Apply topically 3 (three) times daily. 22 g 0   omega-3 acid ethyl esters (LOVAZA) 1 g capsule Take 1 capsule (1 g total) by mouth 2 (two) times daily. 180 capsule 3   OVER THE COUNTER MEDICATION Apply 1 application topically daily as needed (for acne). Oxy 10 vanishing cream     polyethylene glycol powder (GLYCOLAX/MIRALAX) powder MIX 17G WITH LIQUID AND DRINK BY MOUTH DAILY. 527 g 5   Prednisol Ace-Moxiflox-Bromfen 1-0.5-0.075 % SUSP Place 1 drop into the left eye 4 (four) times daily.     PREVIDENT 5000 DRY MOUTH 1.1 % GEL dental gel APPLY THIN RIBBON AND BRUSH FOR  2 MINUTES DAILY IN PLACE OF CONVENTIONAL TOOTHPASTE  99   rosuvastatin (CRESTOR) 10 MG tablet Take 1 tablet (10 mg total) by mouth daily. 90 tablet 3   sodium fluoride (PREVIDENT) 1.1 % GEL dental gel Apply thin ribbon/pea-sized amount to toothbrush. Brush teeth thoroughly, for at least 2 min. Use daily in place of conventional toothpaste. 100 mL 3   Sodium Fluoride 1.1 % PSTE Apply a thin ribbon/pea-sized amount to toothbrush. Brush teeth thoroughly, for at least 2 mintues. Use daily in place of conventional toothpaste. Expectorate Donot swallow 100 mL 3   thiamine (VITAMIN B-1) 100 MG tablet Take 100 mg by mouth 2 (two) times daily at  10 AM and 5 PM.      TURMERIC CURCUMIN PO Take by mouth.     vitamin B-12 (CYANOCOBALAMIN) 500 MCG tablet Take 500-1,000 mcg by mouth daily.      No current facility-administered medications for this visit.     Physical Exam: BP 119/66 (BP Location: Left Arm, Patient Position: Sitting, Cuff Size: Large)   Pulse (!) 56   Resp 20   Ht '6\' 4"'$  (1.93 m)   Wt 220 lb (99.8 kg)   SpO2 96% Comment: RA  BMI 26.78 kg/m  He looks well. Cardiac exam shows a regular rate and rhythm with a 2/6 systolic murmur along the right sternal border.  There is no diastolic murmur. Lungs clear. There is no peripheral edema.  Diagnostic Tests:  Narrative & Impression  CLINICAL DATA:  Follow-up abdominal aortic aneurysm.   EXAM: CT ANGIOGRAPHY CHEST WITH CONTRAST   TECHNIQUE: Multidetector CT imaging of the chest was performed using the standard protocol during bolus administration of intravenous contrast. Multiplanar CT image reconstructions and MIPs were obtained to evaluate the vascular anatomy.   RADIATION DOSE REDUCTION: This exam was performed according to the departmental dose-optimization program which includes automated exposure control, adjustment of the mA and/or kV according to patient size and/or use of iterative reconstruction technique.   CONTRAST:  62m ISOVUE-370 IOPAMIDOL (ISOVUE-370) INJECTION 76%   COMPARISON:  07/07/2021   FINDINGS: Cardiovascular: There is preferential opacification of the thoracic aorta. Heart size appears normal. No pericardial effusion. Aortic atherosclerosis. Coronary artery calcifications.   Sinuses of Valsalva: Measures 4.2 cm, image 67/10. Formally this measured the same (when remeasured.). Penetrating atheromatous ulcer at the lateral aspect of the non coronary cusp is again noted measuring 1.8 cm, image 64/10. Formally 1.3 cm.   Sinotubular junction: Measures 3 cm, image 60/10.  Formally 3.1 cm.   Ascending aorta: Status post tube graft  repair. Unchanged in appearance from previous exam.   Aortic arch: Measures 3.6 cm, image 117/13.  Formally 3.5 cm.   Descending aorta: Unchanged appearance of chronic type 2 dissection with aneurysmal dilatation of the descending thoracic aorta. The false lumen remains occluded. On today's study the aorta measures 4.6 cm, image 116/13. Unchanged from previous exam.   Mediastinum/Nodes: Thyroid gland, trachea and esophagus are unremarkable. No enlarged axillary, mediastinal or hilar lymph nodes.   Lungs/Pleura: No pleural effusion. Paraseptal and centrilobular emphysema. No airspace consolidation. Calcified granuloma identified in the right upper lobe, image 34/8. Left lower lobe calcified granuloma is also noted, image 78/8.   Upper Abdomen: No acute abnormality. The chronic type B aortic dissection extends to just below the level of the renal arteries. The right renal artery arises off the true lumen as before. Single left renal artery arises from both the true and false lumen with similar appearing dissection  flap extending into the ostium.   Gallstones.   Musculoskeletal: Status post median sternotomy. No acute or suspicious osseous findings. No chest wall mass identified.   Review of the MIP images confirms the above findings.   IMPRESSION: 1. Stable appearance of tube graft repair of the ascending aorta. 2. Stable appearance of chronic type 2 dissection with aneurysmal dilatation of the descending thoracic aorta. The false lumen remains occluded. 3. Penetrating atheromatous ulcer at the lateral aspect of the non coronary cusp is again noted measuring 1.8 cm. Formally this measured 1.3 cm on previous exam. 4. Gallstones. 5. Aortic Atherosclerosis (ICD10-I70.0) and Emphysema (ICD10-J43.9).     Electronically Signed   By: Kerby Moors M.D.   On: 07/14/2022 16:02      Impression:  Overall I think he continues to do well following repair of his type A aortic  dissection.  The ascending aortic tube graft repair appears stable.  There is stable appearance of the chronic type B dissection extending down through the thoracic and abdominal aorta just below the renal arteries.  The maximum diameter is 4.6 cm.  The false lumen remains occluded.  Radiology commented on a penetrating atheromatous ulcer at the lateral aspect of the noncoronary cusp in the aortic root which they measured at 1.8 cm.  This was noted on his previous scan and was measured at 1.3 cm.  I have personally reviewed his CT scans and I do not think this is a penetrating atheromatous ulcer at all.  His original dissection extended down into the noncoronary sinus and Dr. Lucianne Lei Trigt reapproximated the dissected layers of the noncoronary sinus with a felt strip and BioGlue with resuspension of the commissure.  The proximal aortic graft was then anastomosed to the aorta just above this.  I think what radiology is thinking is a penetrating atheromatous ulcer is just persistent separation of the layers of the aorta with felt in between.  This looks about the same as it did on CT scan 1 year ago.  It is certainly possible that the separation of the aortic wall at this level may change the geometry of the aortic valve and lead to progressive aortic insufficiency which will need to be followed.  It is also possible that he could develop an aortic root aneurysm over time which again will need to be followed with CT scan.  I reviewed the CT images in detail with him and answered all of his questions.  I have recommended that he have a follow-up CTA of the chest, abdomen, and pelvis in 1 year.  Plan:  I will see him back in 1 year with a CTA of the chest, abdomen, and pelvis.  I spent 20 minutes performing this established patient evaluation and > 50% of this time was spent face to face counseling and coordinating the surveillance of his aorta status post repair of type A aortic dissection.   Gaye Pollack,  MD [facility] [phone]

## 2022-07-23 ENCOUNTER — Telehealth: Payer: Self-pay | Admitting: Allergy & Immunology

## 2022-07-23 NOTE — Telephone Encounter (Signed)
We received a letter from patient's dentist asking for clearance to use certain products in his dental visit.  I reviewed the products and based on literature, it seems that even though there are some porcine derivatives, that can be used without a problem in alpha gal patients.  Letter written to his dental office (Dr. Tedra Senegal, DDS, MS).   Salvatore Marvel, MD [facility]

## 2022-07-26 ENCOUNTER — Other Ambulatory Visit (HOSPITAL_COMMUNITY): Payer: Self-pay

## 2022-07-26 ENCOUNTER — Other Ambulatory Visit: Payer: Self-pay | Admitting: Internal Medicine

## 2022-07-26 MED ORDER — ROSUVASTATIN CALCIUM 10 MG PO TABS
10.0000 mg | ORAL_TABLET | Freq: Every day | ORAL | 3 refills | Status: DC
  Filled 2022-07-26: qty 90, 90d supply, fill #0
  Filled 2022-10-11: qty 90, 90d supply, fill #1
  Filled 2023-01-24: qty 90, 90d supply, fill #2
  Filled 2023-04-18: qty 90, 90d supply, fill #3

## 2022-07-26 NOTE — Telephone Encounter (Signed)
Letter has been faxed to [facility] at [phone]

## 2022-07-27 ENCOUNTER — Other Ambulatory Visit (HOSPITAL_COMMUNITY): Payer: Self-pay

## 2022-07-27 NOTE — Telephone Encounter (Signed)
Thank you for taking care of that.

## 2022-08-02 ENCOUNTER — Other Ambulatory Visit (HOSPITAL_COMMUNITY): Payer: Self-pay

## 2022-08-06 ENCOUNTER — Other Ambulatory Visit (HOSPITAL_COMMUNITY): Payer: Self-pay

## 2022-08-06 DIAGNOSIS — L42 Pityriasis rosea: Secondary | ICD-10-CM | POA: Diagnosis not present

## 2022-08-06 MED ORDER — VALACYCLOVIR HCL 1 G PO TABS
1000.0000 mg | ORAL_TABLET | Freq: Every day | ORAL | 0 refills | Status: DC
  Filled 2022-08-06: qty 7, 7d supply, fill #0

## 2022-08-06 MED ORDER — TRIAMCINOLONE ACETONIDE 0.1 % EX CREA
1.0000 | TOPICAL_CREAM | Freq: Two times a day (BID) | CUTANEOUS | 1 refills | Status: AC
  Filled 2022-08-06: qty 454, 30d supply, fill #0

## 2022-08-09 ENCOUNTER — Other Ambulatory Visit (HOSPITAL_COMMUNITY): Payer: Self-pay

## 2022-08-09 ENCOUNTER — Other Ambulatory Visit: Payer: Self-pay | Admitting: Internal Medicine

## 2022-08-09 ENCOUNTER — Other Ambulatory Visit: Payer: Self-pay | Admitting: Cardiovascular Disease

## 2022-08-09 MED ORDER — OMEGA-3-ACID ETHYL ESTERS 1 G PO CAPS
1.0000 g | ORAL_CAPSULE | Freq: Two times a day (BID) | ORAL | 3 refills | Status: DC
  Filled 2022-08-09: qty 180, 90d supply, fill #0
  Filled 2022-11-18 (×2): qty 180, 90d supply, fill #1
  Filled 2023-02-11: qty 180, 90d supply, fill #2
  Filled 2023-04-18 – 2023-05-19 (×2): qty 180, 90d supply, fill #3

## 2022-08-09 MED ORDER — METOPROLOL TARTRATE 25 MG PO TABS
25.0000 mg | ORAL_TABLET | Freq: Three times a day (TID) | ORAL | 3 refills | Status: DC
  Filled 2022-08-09: qty 270, 90d supply, fill #0
  Filled 2022-11-18 (×2): qty 270, 90d supply, fill #1
  Filled 2023-02-11: qty 270, 90d supply, fill #2
  Filled 2023-05-23: qty 270, 90d supply, fill #3

## 2022-08-10 ENCOUNTER — Other Ambulatory Visit (HOSPITAL_COMMUNITY): Payer: Self-pay

## 2022-08-11 DIAGNOSIS — F411 Generalized anxiety disorder: Secondary | ICD-10-CM | POA: Diagnosis not present

## 2022-08-25 ENCOUNTER — Other Ambulatory Visit (HOSPITAL_COMMUNITY): Payer: Self-pay

## 2022-09-06 DIAGNOSIS — Z6827 Body mass index (BMI) 27.0-27.9, adult: Secondary | ICD-10-CM | POA: Diagnosis not present

## 2022-09-06 DIAGNOSIS — I10 Essential (primary) hypertension: Secondary | ICD-10-CM | POA: Diagnosis not present

## 2022-09-06 DIAGNOSIS — Z87891 Personal history of nicotine dependence: Secondary | ICD-10-CM | POA: Diagnosis not present

## 2022-09-06 DIAGNOSIS — Z1331 Encounter for screening for depression: Secondary | ICD-10-CM | POA: Diagnosis not present

## 2022-09-06 DIAGNOSIS — E119 Type 2 diabetes mellitus without complications: Secondary | ICD-10-CM | POA: Diagnosis not present

## 2022-09-06 DIAGNOSIS — J449 Chronic obstructive pulmonary disease, unspecified: Secondary | ICD-10-CM | POA: Diagnosis not present

## 2022-09-06 DIAGNOSIS — Z7984 Long term (current) use of oral hypoglycemic drugs: Secondary | ICD-10-CM | POA: Diagnosis not present

## 2022-09-08 ENCOUNTER — Other Ambulatory Visit (HOSPITAL_COMMUNITY): Payer: Self-pay

## 2022-09-08 MED ORDER — AMOXICILLIN 500 MG PO TABS
2000.0000 mg | ORAL_TABLET | ORAL | 0 refills | Status: DC
  Filled 2022-09-08: qty 25, 8d supply, fill #0

## 2022-09-09 ENCOUNTER — Other Ambulatory Visit (HOSPITAL_COMMUNITY): Payer: Self-pay

## 2022-09-09 ENCOUNTER — Ambulatory Visit: Payer: HMO | Admitting: Adult Health

## 2022-09-10 ENCOUNTER — Other Ambulatory Visit (HOSPITAL_COMMUNITY): Payer: Self-pay

## 2022-09-13 ENCOUNTER — Other Ambulatory Visit (HOSPITAL_COMMUNITY): Payer: Self-pay

## 2022-09-13 MED ORDER — SODIUM FLUORIDE 1.1 % DT GEL
DENTAL | 12 refills | Status: DC
  Filled 2022-09-13: qty 100, 20d supply, fill #0
  Filled 2022-10-01 (×2): qty 100, 20d supply, fill #1
  Filled 2023-06-21: qty 100, 20d supply, fill #2

## 2022-09-21 ENCOUNTER — Other Ambulatory Visit (HOSPITAL_COMMUNITY): Payer: Self-pay

## 2022-09-21 ENCOUNTER — Other Ambulatory Visit: Payer: Self-pay | Admitting: Internal Medicine

## 2022-09-21 DIAGNOSIS — F411 Generalized anxiety disorder: Secondary | ICD-10-CM | POA: Diagnosis not present

## 2022-09-22 ENCOUNTER — Other Ambulatory Visit (HOSPITAL_COMMUNITY): Payer: Self-pay

## 2022-09-22 MED ORDER — FLUTICASONE PROPIONATE 50 MCG/ACT NA SUSP
2.0000 | Freq: Every day | NASAL | 2 refills | Status: DC
  Filled 2022-09-22 – 2022-10-01 (×2): qty 16, 30d supply, fill #0
  Filled 2022-11-01: qty 16, 30d supply, fill #1
  Filled 2022-11-30: qty 16, 30d supply, fill #2

## 2022-09-23 ENCOUNTER — Other Ambulatory Visit (HOSPITAL_COMMUNITY): Payer: Self-pay

## 2022-09-23 MED ORDER — HYDROCODONE-ACETAMINOPHEN 5-325 MG PO TABS
1.0000 | ORAL_TABLET | Freq: Four times a day (QID) | ORAL | 0 refills | Status: DC | PRN
  Filled 2022-09-23: qty 8, 2d supply, fill #0

## 2022-09-23 MED ORDER — IBUPROFEN 600 MG PO TABS
600.0000 mg | ORAL_TABLET | Freq: Four times a day (QID) | ORAL | 0 refills | Status: DC | PRN
  Filled 2022-09-23: qty 20, 5d supply, fill #0

## 2022-09-23 MED ORDER — CHLORHEXIDINE GLUCONATE 0.12 % MT SOLN
OROMUCOSAL | 2 refills | Status: DC
  Filled 2022-09-23: qty 473, 17d supply, fill #0
  Filled 2022-10-11: qty 473, 17d supply, fill #1
  Filled 2022-11-01: qty 473, 17d supply, fill #2

## 2022-09-27 ENCOUNTER — Encounter: Payer: PPO | Admitting: Psychology

## 2022-09-27 ENCOUNTER — Other Ambulatory Visit (HOSPITAL_COMMUNITY): Payer: Self-pay

## 2022-10-01 ENCOUNTER — Encounter: Payer: Self-pay | Admitting: Allergy & Immunology

## 2022-10-01 ENCOUNTER — Other Ambulatory Visit (HOSPITAL_COMMUNITY): Payer: Self-pay

## 2022-10-04 ENCOUNTER — Other Ambulatory Visit (HOSPITAL_COMMUNITY): Payer: Self-pay

## 2022-10-05 ENCOUNTER — Other Ambulatory Visit (HOSPITAL_BASED_OUTPATIENT_CLINIC_OR_DEPARTMENT_OTHER): Payer: Self-pay

## 2022-10-06 ENCOUNTER — Telehealth: Payer: Self-pay | Admitting: Cardiovascular Disease

## 2022-10-06 DIAGNOSIS — I351 Nonrheumatic aortic (valve) insufficiency: Secondary | ICD-10-CM

## 2022-10-06 NOTE — Telephone Encounter (Signed)
Patient was suppose to see Dr. Eden Emms after his echo in September. Patient is calling to see if he can get an echo again in September, then see Dr. Eden Emms afterwards. Will forward to Dr. Eden Emms to see if he is agreeable to this plan. Patient stated he feels fine at this time and doing great.

## 2022-10-06 NOTE — Telephone Encounter (Signed)
Pt is requesting call back to get clarification on scheduling for upcoming needed appts.  May also need orders for echo. Please advise.

## 2022-10-07 ENCOUNTER — Encounter (HOSPITAL_COMMUNITY): Payer: Self-pay

## 2022-10-11 ENCOUNTER — Other Ambulatory Visit (HOSPITAL_COMMUNITY): Payer: Self-pay

## 2022-10-14 ENCOUNTER — Encounter: Payer: PPO | Attending: Psychology | Admitting: Psychology

## 2022-10-14 DIAGNOSIS — G3184 Mild cognitive impairment, so stated: Secondary | ICD-10-CM | POA: Diagnosis not present

## 2022-10-14 DIAGNOSIS — G4731 Primary central sleep apnea: Secondary | ICD-10-CM

## 2022-10-14 DIAGNOSIS — F0670 Mild neurocognitive disorder due to known physiological condition without behavioral disturbance: Secondary | ICD-10-CM | POA: Diagnosis not present

## 2022-10-14 DIAGNOSIS — F332 Major depressive disorder, recurrent severe without psychotic features: Secondary | ICD-10-CM | POA: Diagnosis not present

## 2022-10-14 DIAGNOSIS — I6782 Cerebral ischemia: Secondary | ICD-10-CM | POA: Insufficient documentation

## 2022-10-14 DIAGNOSIS — I9789 Other postprocedural complications and disorders of the circulatory system, not elsewhere classified: Secondary | ICD-10-CM

## 2022-10-14 DIAGNOSIS — F419 Anxiety disorder, unspecified: Secondary | ICD-10-CM | POA: Diagnosis not present

## 2022-10-19 ENCOUNTER — Telehealth: Payer: Self-pay | Admitting: Adult Health

## 2022-10-19 NOTE — Telephone Encounter (Signed)
I called pt and relayed that per [facility] NP she would like for you to have all testing done prior to seeing him.  The appt was rescheduled to 03-15-2023 at 1500. He was concerned about cost so is conversing with them about this.  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  He appreciated call back.

## 2022-10-19 NOTE — Telephone Encounter (Signed)
Patient has appt  Monday 5/13 but he is still waiting to schedule psych evaluation that was referred back in October. This appointment was already rescheduled from March to May' Does the evaluation need to be completed before he can see Aundra Millet? Or can he be seen?

## 2022-10-20 DIAGNOSIS — F411 Generalized anxiety disorder: Secondary | ICD-10-CM | POA: Diagnosis not present

## 2022-10-25 ENCOUNTER — Ambulatory Visit: Payer: HMO | Admitting: Adult Health

## 2022-10-27 ENCOUNTER — Other Ambulatory Visit: Payer: Self-pay | Admitting: Internal Medicine

## 2022-10-27 DIAGNOSIS — Z1211 Encounter for screening for malignant neoplasm of colon: Secondary | ICD-10-CM

## 2022-11-01 ENCOUNTER — Other Ambulatory Visit (HOSPITAL_COMMUNITY): Payer: Self-pay

## 2022-11-11 DIAGNOSIS — F419 Anxiety disorder, unspecified: Secondary | ICD-10-CM

## 2022-11-11 DIAGNOSIS — F332 Major depressive disorder, recurrent severe without psychotic features: Secondary | ICD-10-CM

## 2022-11-11 DIAGNOSIS — G3184 Mild cognitive impairment, so stated: Secondary | ICD-10-CM | POA: Diagnosis not present

## 2022-11-11 DIAGNOSIS — G4731 Primary central sleep apnea: Secondary | ICD-10-CM

## 2022-11-11 DIAGNOSIS — I6782 Cerebral ischemia: Secondary | ICD-10-CM

## 2022-11-11 NOTE — Progress Notes (Signed)
Neuropsychological Consultation   Patient:   [name]   DOB:   September 27, 1950  MR Number:  [id number]  Location:  [facility] FOR PAIN AND [facility] MEDICINE [facility] PHYSICAL MEDICINE & REHABILITATION [address] [place], [address] [id number] [facility] [place] [address] Dept: [phone]           Date of Service:   10/14/2022  Location of Service and Individuals present: Today's visit was conducted in my outpatient clinic office with the patient myself present.  Start Time:   3 PM End Time:   5 PM  1 hour and 15 minutes was spent in face-to-face clinical interview and the other 45 minutes was spent with record review, report writing and setting up testing protocols.  Patient Consent and Confidentiality: Patient consent was received and limits of confidentiality were also reviewed with the patient being referred for neuropsychological evaluation and formal report to be produced and made available to his referring physician as well as made available in the patient's EMR for appropriate medical professionals to review if warranted.  Consent for Evaluation and Treatment:  Signed:  Yes Explanation of Privacy Policies:  Signed:  Yes Discussion of Confidentiality Limits:  Yes  Provider/Observer:  Arley Phenix, Psy.D.       Clinical Neuropsychologist       Billing Code/Service: 96116/96121  Chief Complaint:     Chief Complaint  Patient presents with   Memory Loss   Anxiety   Fatigue   Other    Word finding difficulties   Cerebrovascular Accident    Reason for Service:    [name] is a 72 year old male who is referred for neuropsychological evaluation due to ongoing cognitive changes post cardiac arrest with prolonged open heart surgery and anesthesia.  Short-term memory difficulties and episodic word finding issues are noted.  Patient has a past medical history that includes complex form of sleep apnea with partial obstructive and partial central apnea  noted.  Patient does not use CPAP machine.  Patient has had some concern for the past 3 years or so about dementia after his open heart surgery.  Patient describes difficulties remembering computer passwords, difficulty navigating his computer, difficulty remembering other people's names etc.  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  He is described previously as misspelling words that he had had no trouble with in the past, difficulties recalling other people's names etc.  It should be noted that the patient is described confusion and cognitive decline going back at least to 2015 and had an MRI conducted in 2017 because of cognitive decline.  Patient is other past medical history including anxiety, COPD, hypertension and migraine.  Fatigue, cognitive decline, peripheral neuropathy and changes in gait/stability.  Patient has had sustained difficulties with generalized anxiety disorder for some time now.  Patient does have prescription for alprazolam but takes it on a limited basis and is aware of its impact it can have on memory.  During the clinical interview today, the patient reports that he has had persistent difficulties with memory and learning since his cardiac event and ongoing cardiac insufficiencies.  Patient reports that he is not sure whether his cognitive difficulties and memory issues are worsening or just continuing.  Patient describes issues like episodic confusion about his remote control as his phone or other short-term elements of confusion.  Patient reports that he is having difficulty distinguishing dream and real-life events.  Patient reports that he will walk around his house looking for his car keys while he has his keys in his hand.  The patient reports that he will forget who is talking to recently and just minutes after talking to them.  Patient reports he has noted going to turn his stereo off but actually turning  off the lights.  Patient primarily describes memory difficulties around episodic memory versus semantic memory.  The patient reports that he is having some word finding difficulties and paraphasic errors.  The patient reports that sometimes the words will come to him a couple moments later.  The patient has described a previous cerebral ischemic injury and peripheral neuropathy as well as a lengthy surgical event for an aortic aneurysm repair.  The patient does report that he was diagnosed with ADHD and had hyperkinetic and attentional difficulties in childhood.  The patient also was diagnosed with dyslexia, separation anxiety and excessive shyness as a child.  Current stressors include experiencing loneliness and aging challenges, lack of meaningful relationships and recent diagnosis with tickborne infection resulting in alpha-gal related allergic reactions to beef, pork and lamb.  Medical History:   Past Medical History:  Diagnosis Date   AAA (abdominal aortic aneurysm) (HCC)    ALLERGIC RHINITIS    Anemia    post op, treated /w Fe   Anxiety    Arthritis    OA, hands, - thumbs- specifically   Brachial plexopathy    double crush injury with median nerve damage on the right dominant hand.    COPD (chronic obstructive pulmonary disease) (HCC)    also states he has been told that he has slight emphysema    Dissecting aortic aneurysm, thoracic (HCC) 02/20/2012   s/p emergent surgical repair   DM2 (diabetes mellitus, type 2) (HCC)    DVT (deep venous thrombosis) (HCC)    coumadin- x3 months , post op   ERECTILE DYSFUNCTION, ORGANIC    GERD (gastroesophageal reflux disease)    related to diet & anxiety    HYPERLIPIDEMIA    Hypertension    pt. followwed by Dr. Eden Emms   MIGRAINE HEADACHE    induced from physical , emotional stress , but sometimes occur randomly, also experiences BPPV   OBSTRUCTIVE SLEEP APNEA 02/2008 sleep study   moderate & central- not able to tolerate CPAP    Thyroid  nodule 04/21/2015   2.3 cm on right, incidental finding on 03/2015 CT chest - f/u US ordered   Unspecified vitamin D deficiency          Patient Active Problem List   Diagnosis Date Noted   Allergic reaction 04/16/2022   Amnestic MCI (mild cognitive impairment with memory loss) 03/11/2022   Attention deficit 03/11/2022   Small vessel disease, cerebrovascular 03/11/2022   Ganglion cyst of both wrists 01/04/2022   Elevated LFTs 03/20/2020   Benign prostatic hyperplasia without lower urinary tract symptoms 03/20/2020   Skin lesion of scalp 03/20/2020   Pseudodementia 03/05/2020   Cerebral microvascular disease 03/05/2020   Severe episode of recurrent major depressive disorder, without psychotic features (HCC) 03/05/2020   Coronary artery calcification seen on CAT scan 03/17/2018   Ischemic brain damage 08/17/2017   Encounter for general adult medical examination with abnormal findings 07/18/2015   Thyroid nodule 04/21/2015   AAA (abdominal aortic aneurysm) without rupture (HCC) 04/15/2015   MCI (mild cognitive impairment) 08/26/2014   Peripheral neuropathy 03/21/2013   DM2 (diabetes mellitus, type 2) (HCC)    Brachial plexopathy  Anxiety    Occlusion and stenosis of carotid artery without mention of cerebral infarction 04/11/2012   Vitamin D deficiency 12/17/2008   Hyperlipidemia associated with type 2 diabetes mellitus (HCC) 12/17/2008   OBSTRUCTIVE SLEEP APNEA 12/17/2008   Migraine 12/17/2008   ALLERGIC RHINITIS 12/17/2008   Asthma 12/17/2008   Additional Tests and Measures from other records:  Neuroimaging Results: Patient had MRI brain without contrast conducted on 08/16/2020 primarily due to history of migraine headaches.  This MRI was compared to an MRI from 2017.  During the 2022 MRI there was no evidence of recent infarction, hemorrhage or mass and there was minimal chronic microvascular ischemic changes since previous evaluation.  Chronic small vessel infarcts were noted  and chronic microhemorrhages without substantial progression were also noted.  Sleep: Patient has a documented history of complex sleep apnea that has been noted to include both obstructive and central apneic features.  Patient reports that he typically gets between 4 and 5 hours of sleep and then wakes up and goes back to sleep for 2-3 more hours.  The patient reports that this is when he is able to sleep and cold temperatures.  When it is hot he gets between 2 and 3 hours wakes up multiple times and then may get 1 to 2-hour more sleep after returning to sleep.  Diet Pattern: Patient reports a good appetite but has been diagnosed with alpha gal and is adjusting to new diet with no mammal proteins.  The patient has tested positive for elevated immune reactions to beef pork and lamb.  Behavioral Observation/Mental Status:   [name]  presents as a 72 y.o.-year-old Right handed African American Male who appeared his stated age. his dress was Appropriate and he was Well Groomed and his manners were Appropriate to the situation.  his participation was indicative of Appropriate behaviors.  There were not physical disabilities noted.  he displayed an appropriate level of cooperation and motivation.    Interactions:    Active Appropriate  Attention:   abnormal and attention span appeared shorter than expected for age  Memory:   within normal limits; recent and remote memory intact  Visuo-spatial:   not examined  Speech (Volume):  low  Speech:   normal; normal  Thought Process:  Coherent and Relevant  Coherent, Directed, Linear, and Logical  Though Content:  WNL; not suicidal and not homicidal  Orientation:   person, place, time/date, and situation  Judgment:   Good  Planning:   Fair  Affect:    Anxious  Mood:    Anxious  Insight:   Good  Intelligence:   high  Marital Status/Living:  The patient was born in [place] along with 2 siblings with no birth or early  developmental issues noted with the exception of hyperkinesis.  The patient reports that he was identified as having dyslexia and attentional and hyperkinetic features in school.  The patient currently lives alone and has lived alone for the past 15 years.  The patient divorced after 22 years of marriage.    Educational and Occupational History:     Highest Level of Education: The patient completed his undergraduate degree from [employer] and a masters degree from the [employer] of [address].  He always maintained a very high GPA throughout school.  The patient's best subjects or science related subjects.  Educational and Career Achievements: Patient was the Chief of Staff, was in Probation officer.  He also had further certification  as clinical research associate and has had numerous continuing education classes through the years.  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  Patient also worked at [employer] for [employer] here in [place].  Hobbies and Interests: Reading/movies  Psychiatric History: Patient reports that he was diagnosed with ADHD with attention and hyperkinetic symptoms early in school but did very well in school and completed his masters degree.  Patient has a long history of anxiety with separation anxiety as a child and generalized anxiety throughout adulthood.  The patient describes being excessively shy as a child as well.    History of Substance Use or Abuse:    Patient describes only occasional alcohol use with limited use at each time of no more than 1 shot of tequila or bourbon.  He has had no tobacco for more than 10 years.  Family Med/Psych History:  Family History  Problem  Relation Age of Onset   Diabetes Mother    Stroke Mother    Dementia Mother    Deep vein thrombosis Father        PE   Varicose Veins Father    Heart disease Maternal Grandmother    Heart disease Maternal Uncle    Heart disease Paternal Uncle    Heart disease Cousin    Lung cancer Cousin        2 cousins    Impression/DX:   [name] is a 72 year old male who is referred for neuropsychological evaluation due to ongoing cognitive changes post cardiac arrest with prolonged open heart surgery and anesthesia.  Short-term memory difficulties and episodic word finding issues are noted.  Patient has a past medical history that includes complex form of sleep apnea with partial obstructive and partial central apnea noted.  Patient does not use CPAP machine.  Patient has had some concern for the past 3 years or so about dementia after his open heart surgery.  Patient describes difficulties remembering computer passwords, difficulty navigating his computer, difficulty remembering other people's names etc.  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  He is described previously as misspelling words that he had had no trouble with in the past, difficulties recalling other people's names etc.  Disposition/Plan:  We have set the patient up for formal neuropsychological relation and once that is completed a formal report will be provided to his referring physician and face-to-face feedback will be conducted with the patient going over diagnostic considerations and treatment recommendations.  Diagnosis:    Amnestic MCI (mild cognitive impairment with memory loss)  Ischemic brain damage  Complex sleep apnea syndrome  Severe episode of recurrent major depressive disorder, without psychotic features (HCC)  Anxiety  Postoperative cardiac complication        Note: This document was prepared using Dragon voice recognition software  and may include unintentional dictation errors.   Electronically Signed   _______________________ Arley Phenix, Psy.D. Clinical Neuropsychologist

## 2022-11-11 NOTE — Progress Notes (Signed)
Behavioral Observations:  The patient appeared well-groomed and appropriately dressed. His manners were polite and appropriate to the situation. The patient's attitude towards testing was positive and his effort was good.  Neuropsychology Note  [name] completed 130 minutes of neuropsychological testing with technician, Staci Acosta, BA, under the supervision of Arley Phenix, PsyD., Clinical Neuropsychologist. The patient did not appear overtly distressed by the testing session, per behavioral observation or via self-report to the technician. Rest breaks were offered.   Clinical Decision Making: In considering the patient's current level of functioning, level of presumed impairment, nature of symptoms, emotional and behavioral responses during clinical interview, level of literacy, and observed level of motivation/effort, a battery of tests was selected by Dr. Kieth Brightly during initial consultation on 10/14/2022. This was communicated to the technician. Communication between the neuropsychologist and technician was ongoing throughout the testing session and changes were made as deemed necessary based on patient performance on testing, technician observations and additional pertinent factors such as those listed above.  Tests Administered: Controlled Oral Word Association Test (COWAT; FAS & Animals)  Grooved Pegboard Wechsler Adult Intelligence Scale, 4th Edition (WAIS-IV) Wechsler Memory Scale, 4th Edition (WMS-IV); Older Adult Battery   Results:  COWAT:  FAS total= 39 Z= 0.04  Animals total= 24 Z= 1.36   Grooved Pegboard: R (DH) time= 157s Percentile Rank= 23rd  L (NDH) time= 116s Percentile Rank= 38th  WAIS-IV: Composite Score Summary  Scale Sum of Scaled Scores Composite Score Percentile Rank 95% Conf. Interval Qualitative Description  Verbal Comprehension 37 VCI 112 79 106-117 High Average  Perceptual Reasoning 29 PRI 98 45 92-104 Average  Working Memory  16 WMI 89 23 83-96 Low Average  Processing Speed 16 PSI 89 23 82-98 Low Average  Full Scale 98 FSIQ 98 45 94-102 Average  General Ability 66 GAI 105 63 100-110 Average   Verbal Comprehension Subtests Summary  Subtest Raw Score Scaled Score Percentile Rank Reference Group Scaled Score SEM  Similarities 27 11 63 11 1.04  Vocabulary 45 12 75 13 0.67  Information 22 14 91 16 0.73  The scaled scores in the Reference Group Scaled Score column are based on the performance of examinees aged 20:0-34:11 (i.e., the reference group). See Chapter 6 of the WAIS-IV Technical and Interpretive Manual for more information.  Perceptual Reasoning Subtests Summary  Subtest Raw Score Scaled Score Percentile Rank Reference Group Scaled Score SEM  Block Design 40 12 75 9 1.08  Matrix Reasoning 9 7 16 4  0.90  Visual Puzzles 11 10 50 7 0.85   Working Librarian, academic Raw Score Scaled Score Percentile Rank Reference Group Scaled Score SEM  Digit Span 19 7 16 5  0.79  Arithmetic 13 9 37 9 0.99   Processing Speed Subtests Summary  Subtest Raw Score Scaled Score Percentile Rank Reference Group Scaled Score SEM  Symbol Search 18 7 16 5  1.31  Coding 50 9 37 6 0.99    WMS-IV:  Index Score Summary  Index Sum of Scaled Scores Index Score Percentile Rank 95% Confidence Interval Qualitative Descriptor  Auditory Memory (AMI) 39 98 45 92-104 Average  Visual Memory (VMI) 16 89 23 84-94 Low Average  Immediate Memory (IMI) 29 97 42 91-103 Average  Delayed Memory (DMI) 26 91 27 84-99 Average   Primary Subtest Scaled Score Summary  Subtest Domain Raw Score Scaled Score Percentile Rank  Logical Memory I AM 24 7 16   Logical Memory II AM 14 8 25   Verbal  Paired Associates I AM 29 13 84  Verbal Paired Associates II AM 7 11 63  Visual Reproduction I VM 30 9 37  Visual Reproduction II VM 10 7 16   Symbol Span VWM 19 10 50   Auditory Memory Process Score Summary  Process Score Raw Score Scaled Score  Percentile Rank Cumulative Percentage (Base Rate)  LM II Recognition 14 - - <=2%  VPA II Recognition 30 - - >75%   Visual Memory Process Score Summary  Process Score Raw Score Scaled Score Percentile Rank Cumulative Percentage (Base Rate)  VR II Recognition 5 - - 51-75%   ABILITY-MEMORY ANALYSIS  Ability Score:  VCI: 112 Date of Testing:  WAIS-IV; WMS-IV 2022/11/11  Predicted Difference Method   Index Predicted WMS-IV Index Score Actual WMS-IV Index Score Difference Critical Value  Significant Difference Y/N Base Rate  Auditory Memory 106 98 8 9.51 N   Visual Memory 105 89 16 8.07 Y 10-15%  Immediate Memory 107 97 10 10.30 N   Delayed Memory 106 91 15 11.51 Y 10-15%  Statistical significance (critical value) at the .01 level.    Feedback to Patient: DOMINION EVERSON will return on 02/10/2023 for an interactive feedback session with Dr. Kieth Brightly at which time his test performances, clinical impressions and treatment recommendations will be reviewed in detail. The patient understands he can contact our office should he require our assistance before this time.  130 minutes spent face-to-face with patient administering standardized tests, 30 minutes spent scoring Radiographer, therapeutic). [CPT [id number], [id number]]  Full report to follow.

## 2022-11-18 ENCOUNTER — Other Ambulatory Visit (HOSPITAL_COMMUNITY): Payer: Self-pay

## 2022-11-22 ENCOUNTER — Other Ambulatory Visit (HOSPITAL_COMMUNITY): Payer: Self-pay

## 2022-11-22 ENCOUNTER — Other Ambulatory Visit: Payer: Self-pay | Admitting: Internal Medicine

## 2022-11-22 DIAGNOSIS — J4520 Mild intermittent asthma, uncomplicated: Secondary | ICD-10-CM

## 2022-11-22 DIAGNOSIS — I10 Essential (primary) hypertension: Secondary | ICD-10-CM

## 2022-11-22 MED ORDER — HYDROCHLOROTHIAZIDE 25 MG PO TABS
25.0000 mg | ORAL_TABLET | Freq: Every day | ORAL | 1 refills | Status: DC
Start: 2022-11-22 — End: 2023-05-24
  Filled 2022-11-22: qty 90, 90d supply, fill #0
  Filled 2023-02-11: qty 90, 90d supply, fill #1

## 2022-11-22 MED ORDER — MONTELUKAST SODIUM 10 MG PO TABS
5.0000 mg | ORAL_TABLET | Freq: Two times a day (BID) | ORAL | 1 refills | Status: DC
Start: 2022-11-22 — End: 2023-05-24
  Filled 2022-11-22: qty 90, 90d supply, fill #0
  Filled 2023-02-11: qty 90, 90d supply, fill #1

## 2022-11-23 ENCOUNTER — Other Ambulatory Visit (HOSPITAL_COMMUNITY): Payer: Self-pay

## 2022-11-23 ENCOUNTER — Encounter: Payer: PPO | Attending: Psychology

## 2022-11-23 DIAGNOSIS — F332 Major depressive disorder, recurrent severe without psychotic features: Secondary | ICD-10-CM | POA: Diagnosis not present

## 2022-11-23 DIAGNOSIS — I6782 Cerebral ischemia: Secondary | ICD-10-CM | POA: Insufficient documentation

## 2022-11-23 DIAGNOSIS — G3184 Mild cognitive impairment, so stated: Secondary | ICD-10-CM

## 2022-11-23 NOTE — Progress Notes (Signed)
   Behavioral Observations:  The patient appeared well-groomed and appropriately dressed. His manners were polite and appropriate to the situation. The patient's attitude towards testing was positive and his effort was good.   Neuropsychology Note  [name] completed 105 minutes of neuropsychological testing with technician, Staci Acosta, BA, under the supervision of Arley Phenix, PsyD., Clinical Neuropsychologist. The patient did not appear overtly distressed by the testing session, per behavioral observation or via self-report to the technician. Rest breaks were offered.   Clinical Decision Making: In considering the patient's current level of functioning, level of presumed impairment, nature of symptoms, emotional and behavioral responses during clinical interview, level of literacy, and observed level of motivation/effort, a battery of tests was selected by Dr. Kieth Brightly during initial consultation on 10/14/2022. This was communicated to the technician. Communication between the neuropsychologist and technician was ongoing throughout the testing session and changes were made as deemed necessary based on patient performance on testing, technician observations and additional pertinent factors such as those listed above.  Tests Administered: Comprehensive Attention Battery (CAB) Continuous Performance Test (CPT)   Results: Will be included in final report   Feedback to Patient: Ryan SQUIER will return on 02/10/2023 for an interactive feedback session with Dr. Kieth Brightly at which time his test performances, clinical impressions and treatment recommendations will be reviewed in detail. The patient understands he can contact our office should he require our assistance before this time.  105 minutes spent face-to-face with patient administering standardized tests, 30 minutes spent scoring Radiographer, therapeutic). [CPT [id number], [id number]]  Full report to follow.

## 2022-11-24 DIAGNOSIS — F411 Generalized anxiety disorder: Secondary | ICD-10-CM | POA: Diagnosis not present

## 2022-11-29 ENCOUNTER — Other Ambulatory Visit (HOSPITAL_COMMUNITY): Payer: Self-pay

## 2022-11-29 MED ORDER — AMOXICILLIN 500 MG PO TABS
2000.0000 mg | ORAL_TABLET | ORAL | 0 refills | Status: DC
  Filled 2022-11-29: qty 4, 1d supply, fill #0

## 2022-11-30 ENCOUNTER — Other Ambulatory Visit (HOSPITAL_COMMUNITY): Payer: Self-pay

## 2022-12-06 ENCOUNTER — Other Ambulatory Visit (HOSPITAL_COMMUNITY): Payer: Self-pay

## 2022-12-06 DIAGNOSIS — Z1211 Encounter for screening for malignant neoplasm of colon: Secondary | ICD-10-CM | POA: Diagnosis not present

## 2022-12-09 LAB — COLOGUARD: COLOGUARD: NEGATIVE

## 2022-12-13 DIAGNOSIS — E041 Nontoxic single thyroid nodule: Secondary | ICD-10-CM | POA: Diagnosis not present

## 2022-12-13 DIAGNOSIS — Z91018 Allergy to other foods: Secondary | ICD-10-CM | POA: Diagnosis not present

## 2022-12-13 DIAGNOSIS — E1142 Type 2 diabetes mellitus with diabetic polyneuropathy: Secondary | ICD-10-CM | POA: Diagnosis not present

## 2022-12-14 ENCOUNTER — Encounter: Payer: Self-pay | Admitting: Psychology

## 2022-12-14 ENCOUNTER — Encounter: Payer: PPO | Attending: Psychology | Admitting: Psychology

## 2022-12-14 DIAGNOSIS — F419 Anxiety disorder, unspecified: Secondary | ICD-10-CM | POA: Diagnosis not present

## 2022-12-14 DIAGNOSIS — I6782 Cerebral ischemia: Secondary | ICD-10-CM | POA: Insufficient documentation

## 2022-12-14 DIAGNOSIS — G3184 Mild cognitive impairment, so stated: Secondary | ICD-10-CM | POA: Diagnosis not present

## 2022-12-14 DIAGNOSIS — F0670 Mild neurocognitive disorder due to known physiological condition without behavioral disturbance: Secondary | ICD-10-CM | POA: Diagnosis not present

## 2022-12-14 DIAGNOSIS — G4731 Primary central sleep apnea: Secondary | ICD-10-CM | POA: Diagnosis not present

## 2022-12-14 DIAGNOSIS — F332 Major depressive disorder, recurrent severe without psychotic features: Secondary | ICD-10-CM | POA: Insufficient documentation

## 2022-12-14 NOTE — Progress Notes (Addendum)
Neuropsychological Evaluation   Patient:  [name]   DOB: 1950-08-12  MR Number: [id number]  Location: [facility] FOR [facility]IN AND [facility] MEDICINE [facility] PHYSICAL MEDICINE & REHABILITATION [address] [place], [address] [id number] [facility] [place] [address] Dept: [phone]  Start: 8 AM End: 9 AM  Provider/Observer:     Hershal Coria PsyD  Chief Complaint:      Chief Complaint  Patient presents with   Memory Loss   Anxiety   Fatigue   Cerebrovascular Accident   Other    Word finding difficulty    Reason For Service:      [name] is a 72 year old male who was referred for neuropsychological evaluation by his primary care provider [name], MD and the patient is also being followed by [name], MD for neurological care.  This referral was due to ongoing cognitive changes post cardiac arrest with prolonged open heart surgery and anesthesia.  Short-term memory difficulties and episodic word finding issues are noted.  Patient has a past medical history that includes complex form of sleep apnea with partial obstructive and partial central apnea noted.  Patient does not use a CPAP machine.  Patient has had some concern for the past 3 years or so about dementia after his open heart surgery.  Patient describes difficulties remembering computer passwords, difficulty navigating his computer, difficulty remembering other people's names etc.  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  He is described himself previously as misspelling words that he had had no trouble with in the past, difficulties recalling other people's names etc.  It should be noted that the patient has described confusion and cognitive decline going back at least to 2015 and had an MRI conducted in 2017 because of cognitive decline.   Patient's past medical history also include anxiety, COPD, hypertension  and migraine.  Fatigue, cognitive decline, peripheral neuropathy and changes in gait/stability are noted.  Patient has had sustained difficulties with generalized anxiety disorder for some time now.  Patient does have prescription for alprazolam regular basis and is aware of its impact it can have on memory.   During the clinical interview today, the patient reports that he has had persistent difficulties with memory and learning since his cardiac event and ongoing cardiac insufficiencies.  Patient reports that he is not sure whether his cognitive difficulties and memory issues are worsening or just continuing.  Patient describes issues like episodic confusion about his remote control as his phone or other short-term elements of confusion.  Patient reports that he is having difficulty distinguishing dream and real-life events.  Patient reports that he will walk around his house looking for his car keys while he has his keys in his hand.  The patient reports that he will forget who he has talked to recently within minutes after talking to them.  Patient reports he has noted going to turn his stereo off but actually turning off the lights.  Patient primarily describes memory difficulties around episodic memory versus semantic memory.  The patient reports that he is having some word finding difficulties and paraphasic errors.  The patient reports that sometimes the words will come to him a couple moments later.   The patient has described a previous cerebral ischemic injury and peripheral neuropathy as well as a lengthy surgical event for an aortic aneurysm repair.   The patient does report that he was diagnosed  with ADHD and had hyperkinetic and attentional difficulties in childhood.  The patient also was diagnosed with dyslexia, separation anxiety and excessive shyness as a child.  He did very well in school and completed his masters degree.    Current stressors include experiencing loneliness and aging  challenges, lack of meaningful relationships and recent diagnosis with tickborne infection resulting in alpha-gal related allergic reactions to beef, pork and lamb.   Medical History:                         Past Medical History:  Diagnosis Date   AAA (abdominal aortic aneurysm) (HCC)     ALLERGIC RHINITIS     Anemia      post op, treated /w Fe   Anxiety     Arthritis      OA, hands, - thumbs- specifically   Brachial plexopathy      double crush injury with median nerve damage on the right dominant hand.    COPD (chronic obstructive pulmonary disease) (HCC)      also states he has been told that he has slight emphysema    Dissecting aortic aneurysm, thoracic (HCC) 02/20/2012    s/p emergent surgical repair   DM2 (diabetes mellitus, type 2) (HCC)     DVT (deep venous thrombosis) (HCC)      coumadin- x3 months , post op   ERECTILE DYSFUNCTION, ORGANIC     GERD (gastroesophageal reflux disease)      related to diet & anxiety    HYPERLIPIDEMIA     Hypertension      pt. followwed by Dr. Eden Emms   MIGRAINE HEADACHE      induced from physical , emotional stress , but sometimes occur randomly, also experiences BPPV   OBSTRUCTIVE SLEEP APNEA 02/2008 sleep study    moderate & central- not able to tolerate CPAP    Thyroid nodule 04/21/2015    2.3 cm on right, incidental finding on 03/2015 CT chest - f/u US ordered   Unspecified vitamin D deficiency                                                             Patient Active Problem List    Diagnosis Date Noted   Allergic reaction 04/16/2022   Amnestic MCI (mild cognitive impairment with memory loss) 03/11/2022   Attention deficit 03/11/2022   Small vessel disease, cerebrovascular 03/11/2022   Ganglion cyst of both wrists 01/04/2022   Elevated LFTs 03/20/2020   Benign prostatic hyperplasia without lower urinary tract symptoms 03/20/2020   Skin lesion of scalp 03/20/2020   Pseudodementia 03/05/2020   Cerebral microvascular disease  03/05/2020   Severe episode of recurrent major depressive disorder, without psychotic features (HCC) 03/05/2020   Coronary artery calcification seen on CAT scan 03/17/2018   Ischemic brain damage 08/17/2017   Encounter for general adult medical examination with abnormal findings 07/18/2015   Thyroid nodule 04/21/2015   AAA (abdominal aortic aneurysm) without rupture (HCC) 04/15/2015   MCI (mild cognitive impairment) 08/26/2014   Peripheral neuropathy 03/21/2013   DM2 (diabetes mellitus, type 2) (HCC)     Brachial plexopathy     Anxiety     Occlusion and stenosis of carotid artery without mention of cerebral infarction 04/11/2012  Vitamin D deficiency 12/17/2008   Hyperlipidemia associated with type 2 diabetes mellitus (HCC) 12/17/2008   OBSTRUCTIVE SLEEP APNEA 12/17/2008   Migraine 12/17/2008   ALLERGIC RHINITIS 12/17/2008   Asthma 12/17/2008    Additional Tests and Measures from other records:   Neuroimaging Results: Patient had MRI brain without contrast conducted on 08/16/2020 primarily due to history of migraine headaches.  This MRI was compared to an MRI from 2017.  During the 2022 MRI there was no evidence of recent infarction, hemorrhage or mass effect and there was minimal chronic microvascular ischemic changes since previous evaluation.  Chronic small vessel infarcts were noted and chronic microhemorrhages without substantial progression were also noted.  The patient also had NM Pet brain amyloid assessment conducted on 04/01/2022.  This was a negative assessment indicating only sparse or no neuritic beta amyloid plaques and was interpreted as inconsistent with the pathological diagnosis of Alzheimer's disease.  There was no increased florbetapir uptake seen in cortical cerebral gray matter.  The brain shows normal gray-white contrast throughout temporal lobes, parietal lobes and frontal lobes.  Cerebellum also showed no evidence of abnormal uptake.   Sleep: Patient has a documented  history of complex sleep apnea that has been noted to include both obstructive and central apneic features.  Patient reports that he typically gets between 4 and 5 hours of sleep and then wakes up and goes back to sleep for 2-3 more hours.  He has difficulty sleeping unless it is cold and when it is hot, he gets between 2 and 3 hours and wakes up multiple times and then may get 1 to 2-hour more sleep after returning to sleep.   Diet Pattern: Patient reports a good appetite but has been diagnosed with alpha gal and is adjusting to new diet with no mammal proteins.  The patient has tested positive for elevated immune reactions to beef pork and lamb.  Tests Administered: Controlled Oral Word Association Test (COWAT; FAS & Animals)  Grooved Pegboard Wechsler Adult Intelligence Scale, 4th Edition (WAIS-IV) Wechsler Memory Scale, 4th Edition (WMS-IV); Older Adult Battery  Comprehensive Attention Battery (CAB) Continuous Performance Test (CPT)  Participation Level:   Active  Participation Quality:  Appropriate      Behavioral Observation:  The patient appeared well-groomed and appropriately dressed. His manners were polite and appropriate to the situation. The patient's attitude towards testing was positive and his effort was good.   Well Groomed, Alert, and Appropriate.   Test Results:   Initially, an estimation was made as to the patient's premorbid intellectual and cognitive functioning.  The patient reports that he had been diagnosed with ADHD and hyperkinesis with attentional difficulties in childhood and also was diagnosed with dyslexia.  Patient had significant separation anxiety as a very young child.  However, he did very well in school and completed his masters degree being able to overcome his attentional issues and dyslexia symptoms.  The patient has had a number of high demand jobs requiring intellect and cognitive ability.  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  It is estimated that the patient's likely premorbid intellectual and cognitive abilities were at the very least in the high average if not superior  range relative to normative population.  We will utilize a conservative estimate for the patient to been roughly 1-1/2 standard deviations above normative values for comparison purposes and to have been functioning in the high average to superior range on various cognitive domains.  An estimation was also made as to the validity of the current assessment.  The patient appeared to try his hardest throughout the assessment and remained diligent with good effort throughout.  While formal validity measures were not conducted embedded validity checks were reviewed.  The patient did have some difficulties with primary auditory encoding but did better on more demanding measures requiring active processing of information in his active Register.  On 2 of the 3 recognition/forced choice memory items the patient showed significant improvement in performance without indications of purposefully showing any type of deficit.  This does appear to be a fair and valid assessment of the patient's current cognitive and intellectual abilities.  To provide a thorough assessment of a wide range of attentional and executive functioning domains the patient was administered the comprehensive attention battery and the CAB CPT measures.  This is a structured analysis of multiple domains of attention and concentration looking at both auditory and visual attentional components.  Initially, the patient was administered the pure auditory and pure visual reaction time measures.  On the pure visual  reaction time measure the patient had a significant elevation of errors of omission but review of raw data suggest that this primarily had to do with initial difficulties acclimating to the touch screen use or interface that required pressure versus electro static inputs.  The patient's average response time on pure visual reaction time measures were within normal limits with no indications of slowed information processing speed.  The same pattern was also noted for the pure auditory reaction time with average response times well within normative expectations but an elevated number of errors of omission.  These omissions did appear to be primarily related to difficulty adjusting to use or interface.  The patient was then administered discriminant reaction time measures.  The patient did much better as far as use of the touch screen interface showing significant reduction in errors of omission.  On the visual discriminate reaction time measure the patient correctly identified 33 of 35 targets with 1 error of commission and 2 errors of omission.  This performances well within normative expectations.  The patient's average response time was 444 ms which is also within normative expectations.  On the auditory discriminate reaction time measure the patient performed equally as well.  He had correct responses to 34 of 35 targets with 1 error of commission and 1 error of omission.  Average response times were well within normative expectations averaging 734 ms.  On the mixed discriminate reaction time, the patient had much greater difficulty.  He struggled understanding the instructions and being able to shift attention back between targeted auditory or targeted visual stimuli.  He correctly responded to 7 of 30 targets with 7 errors of commission and 23 errors of omission.  The instructions were given multiple times and he would show understanding with the instructional phase but then had significant difficulties when  the requirements of the task demanded shifting between auditory versus visual target stimuli.  While the patient's average response time for correct items was within normal limits he showed significant deficits with regard to shifting attention.  The patient was then administered the auditory/visual scan reaction time measures.  On the visual scan reaction time measure  the patient again showed increased levels of errors of omission.  The patient correctly identified 33 of 40 targets with 7 errors of omission on the visual scan measure with average response times within normal limits.  The patient showed the same type of pattern for the auditory scan reaction time measure correctly responding to 32 of 40 targets with 8 errors of commission.  On the mixed scan measure the patient performed better correctly responded to 38 of 40 targets with 2 errors of omission.  All of his average response times were within normal limits.  The patient was then administered the auditory/visual encoding measures.  On the auditory forward measure the patient performed at the upper end of the average range but had some difficulty on the auditory backwards showing some difficulty with processing and manipulating information in his primary auditory Register.  The patient did better on visual encoding measures for both forwards and backwards performing the upper end of the average range for visual encoding.  Patient was then administered the Stroop interference cancellation test.  This measures divided into sections.  Is a primary focus execute measure assessing visual information processing speed with visual scanning and visual attention requirements.  The patient started off very efficiently and the 9 interference trials which showed some deterioration as a function of time.  This measure is then transitioned into a targeted interference trial where the patient continues to do the same process but has a targeted auditory  interference applied in the background.  The patient initially had significant difficulty remaining free from distraction but quickly adjusted to this and by the last 3 trials of the targeted interference he had his best sequence of performance all of which were in the average to upper end of the average range relative to a normative population.  The patient did appear to be able to avoid significant impact or distraction from external distractors.  Finally, as part of this in-depth analysis of attention and executive functioning the patient was administered the visual monitor CPT measure from the comprehensive attention battery.  This is a 15-minute continuous performance measure that is broken down into five 3-minute blocks of time for analysis.  On the first 3 minutes of this measure the patient correctly identified 30 of 30 targets with 2 errors of commission and no errors of omission.  Average response time was 480 ms all of which are within normal limits and consistent with efficient performance on the visual discriminate reaction time measure performed earlier in this battery.  The patient continues to perform well through the first 6 minutes of this CPT measure for both accuracy and response time measures.  However, the patient's accuracy scores remained relatively good with only mild elevations of impulsive responses relative to a normative population he showed significant deterioration in information processing speed as a function of time.  For example on the 6-9-minute mark his average response time  deteriorated to 1170 ms required for each, which is more than double time the response time needed and either the 0-3-minute mark or 3-6-minute mark.  This pattern of slowed information processing speed continued throughout the remainder of the task without a significant increase in errors of commission.  The patient did not have any errors of omission during the final 9 minutes of this measure.  However,  the significant and sustained deterioration in information processing speed after the first 6 minutes of the task do suggest deficits with regard to sustaining attention and patterns consistent with his diagnosis  of attention deficit disorder as a child.  COWAT:  FAS total= 39 Z= 0.04  Animals total= 24 Z= 1.36  The patient was then administered the control or word association test as he had described difficulties with word finding and fluency capacity.  The patient performed in the average range for the FAS test, which is a measure of lexical fluency when compared to age, gender and education matched comparison group.  The patient did even better on measures of semantic fluency performing almost 1 and half standard deviation above age, gender and education matched comparison group.  There did not appear to be any objective findings of lexical or semantic fluency deficits when compared against matched normative comparisons.   Grooved Pegboard: R (DH) time= 157s Percentile Rank= 23rd  L (NDH) time= 116s Percentile Rank= 38th   The patient was also given the grooved pegboard test which assesses fine motor control.  On this measure the patient performed well for both his right dominant hand and left nondominant hand and while they were somewhat below the 50th percentile they were still within normal limits.  There was some indication of significant lateralization of weaknesses as he showed greater relative weaknesses with regard to his right dominant hand and actually performed more slowly with his dominant hand versus nondominant hand.  WAIS-IV:           Composite Score Summary          Scale Sum of Scaled Scores Composite Score Percentile Rank 95% Conf. Interval Qualitative Description  Verbal Comprehension 37 VCI 112 79 106-117 High Average  Perceptual Reasoning 29 PRI 98 45 92-104 Average  Working Memory 16 WMI 89 23 83-96 Low Average  Processing Speed 16 PSI 89 23 82-98 Low Average   Full Scale 98 FSIQ 98 45 94-102 Average  General Ability 66 GAI 105 63 100-110 Average    The patient was then administered the Wechsler Adult Intelligence Scale to provide a thorough assessment of a wide range of cognitive domains and a highly structured excellently normed battery of measures.  This measure was primarily used to allow for efficient repeat testing as it is highly standardized and can be administered in a structured way allowing for comparisons over time.  It should also be noted that the patient is describing changes in cognitive functioning and the current results should not be used as an estimation of premorbid intellectual and cognitive functioning but a description of his current status.  2 composite scores were calculated with this measure.  The patient produced a full-scale IQ score of 98 which falls at the 45th percentile and is in the average range.  While this is not a significantly impaired score relative to a normative population it is likely at least a decrease of 1 and half standard deviations in performance from the patient's predicted premorbid capacity.  We also calculated the patient's general abilities index score which places less emphasis on elements such as auditory encoding and working memory as well as information processing speed measures and states tend to be the most vulnerable to acute change.  The patient produced a general abilities index score of 105 which falls at the 63rd percentile and is in the average range but still roughly 15 points/1 standard deviation below predicted levels of premorbid function.  This overall pattern would be consistent with some global loss in 2 or more cognitive domains.           Verbal Comprehension Subtests Summary  Subtest Raw Score Scaled Score Percentile Rank Reference Group Scaled Score SEM  Similarities 27 11 63 11 1.04  Vocabulary 45 12 75 13 0.67  Information 22 14 91 16 0.73   The patient produced a verbal  comprehension index score of 112 which falls at the 79th percentile and in the high average range of functioning relative to a normative population.  This is only slightly below predicted levels of premorbid functioning does not indicate any significant loss of function in this domain.  The patient performed in the average range on measures of verbal reasoning and problem-solving.  He performed consistent with premorbid estimates for both his vocabulary knowledge as well as his general fund of information and affected quite well on measures of his general fund of information.           Perceptual Reasoning Subtests Summary        Subtest Raw Score Scaled Score Percentile Rank Reference Group Scaled Score SEM  Block Design 40 12 75 9 1.08  Matrix Reasoning 9 7 16 4  0.90  Visual Puzzles 11 10 50 7 0.85   The patient produced a perceptual reasoning index score of 98 which falls at the 45th percentile and is in the average range relative to a normative population.  There was some significant scatter within subtest performance with the patient doing well on measures of visual analysis and organizational skills and his ability to analyze geometric patterns with hold part recognition skills.  The patient showed significant weakness on measures of nonverbal reasoning and problem-solving capacity.  Patient performed in the average range on measures of attention to detail and fluid reasoning skills.  This does suggest some potential difficulties and loss with regard to his nonverbal reasoning skills consistent with some potential weaknesses with his verbal reasoning skills which were also down relative to premorbid estimates.          Working Comptroller Raw Score Scaled Score Percentile Rank Reference Group Scaled Score SEM  Digit Span 19 7 16 5  0.79  Arithmetic 13 9 37 9 0.99    The patient produced a working memory index score of 89 which falls at the 23rd percentile and is in  the low average range relative to a normative population this performance is generally consistent with his performance on auditory encoding measures from the comprehensive attention battery and suggest some weakness relative to premorbid functioning with primary auditory encoding capacity but improvements on functions where he is asked to process information in his active auditory Register.  However, these do represent some weaknesses in this area relative to premorbid estimates.          Processing Speed Subtests Summary        Subtest Raw Score Scaled Score Percentile Rank Reference Group Scaled Score SEM  Symbol Search 18 7 16 5  1.31  Coding 50 9 37 6 0.99    The patient produced a processing speed index score of 89 which falls at the 23rd percentile and in the low average range relative to a normative population.  This is significantly below estimates of premorbid capacity with the patient showing average to low average performance on measures of visual scanning, visual search and overall speed of mental operations.  Slowed psychomotor speed were noted on both measures relative to premorbid estimates as I suspect the patient had done quite well on these measures historically.   WMS-IV:  Index Score Summary        Index Sum of Scaled Scores Index Score Percentile Rank 95% Confidence Interval Qualitative Descriptor  Auditory Memory (AMI) 39 98 45 92-104 Average  Visual Memory (VMI) 16 89 23 84-94 Low Average  Immediate Memory (IMI) 29 97 42 91-103 Average  Delayed Memory (DMI) 26 91 27 84-99 Average     The patient was then administered the Wechsler Memory Scale-IV for older adults to provide an objective assessment of a wide range of learning and memory capacity.  The patient has likely done very well in this cognitive domains throughout his life and his current performances while following in the average range predominantly were likely significantly below premorbid functioning  capacity.  The patient showed mild relative weakness and performances below premorbid estimates for auditory encoding but appeared to do somewhat better on measures of visual encoding capacity.  While neither auditory or visual encoding capacity were significantly impaired they were below estimates of premorbid functioning.  This level of encoding capacity would have some impact on his ability to store and organize new information but not to the point that would leave him unable to learn new information.  Breaking memory components down between auditory versus visual memory the patient produced an auditory memory index score of 98 which falls at the 45th percentile and is in the average range.  This is roughly 1-1/2 standard deviation below premorbid estimations.  The patient showed greater difficulty with regard to visual memory producing a visual memory index score of 89 which fell at the 23rd percentile and in the low average range relative to normative population.  Breaking memory functions down between immediate versus delayed memory the patient produced an immediate memory index score of 97 which falls at this 42nd percentile and in the average range.  This is somewhat below estimates of premorbid functioning and suggest some difficulties or changes in his capacity to store and organize new information but is generally consistent with his current level of encoding capacity.  The patient showed greater difficulties with delayed memory producing a delayed memory index score of 91 which falls at the 27th percentile relative to a normative population.  Looking deeper into delayed memory storage and retrieval the patient was administered a number of recognition/cued recall measures.  The patient showed little improvement four-story learning when placed in a cued recall/recognition format.  However, he showed significant improvements under verbal paired learning domains for cued recall and also showed  significant improvements in visual memory under cued recall/recognition formats.  This pattern would suggest at least some degree of memory deficits or changes particularly for delayed memory related to free recall of information rather than a true inability to store and organize new information.  While his storage and organization of information is likely below premorbid estimates by roughly 1-1 and half standard deviation some of his memory weaknesses are related to retrieval rather than storage and organization of new information.         Primary Subtest Scaled Score Summary       Subtest Domain Raw Score Scaled Score Percentile Rank  Logical Memory I AM 24 7 16   Logical Memory II AM 14 8 25   Verbal [facility] I AM 29 13 84  [facility] II AM 7 11 63  Visual Reproduction I VM 30 9 37  Visual Reproduction II VM 10 7 16   Symbol Span VWM 19 10 50  Auditory Memory Process Score Summary      Process Score Raw Score Scaled Score Percentile Rank Cumulative Percentage (Base Rate)  LM II Recognition 14 - - <=2%  VPA II Recognition 30 - - >75%           Visual Memory Process Score Summary      Process Score Raw Score Scaled Score Percentile Rank Cumulative Percentage (Base Rate)  VR II Recognition 5 - - 51-75%     Impression/Diagnosis:   The results of the current neuropsychological evaluation are not consistent with those typically seen with progressive degenerative condition such as Alzheimer's, frontotemporal dementia, Lewy body etc.  And are much more consistent with cerebrovascular changes due to history of type 2 diabetes, microvascular ischemic changes, previous small vessel disease and chronic small vessel infarcts bilaterally and potential subcortical white matter involvement in as well as thalamic and cerebellum regions related to chronic microhemorrhages.  The patient has been describing concerns around his memory and learning another cognitive decline  issues dating back to at least 2015 with MRI conducted in 2017 because of concerns around cognitive decline.  Patient has a significant past medical history related to hypertension, migraine, neuropathy and changes in gait reported.  Patient has a history of difficult to treat obstructive and central apnea noted without CPAP use.  Patient has had significant cardiovascular deficits with previous open heart surgery and worsening of symptoms post open heart surgery.  He has ongoing issues with cardiac insufficiencies with previous cerebral ischemic injury and peripheral neuropathy with lengthy surgical event for an aortic aneurysm repair.  Anxiety and mood changes are also noted as well as significant ongoing sleeping difficulties.  MRI and pet imaging of beta amyloid neuritic plaque status were both inconsistent with findings typically seen with Alzheimer's type diseases as well.  The patient does have cognitive changes and cognitive loss in a number of domains including the efficiency with which she is learning new information and difficulties/changes with his premorbid auditory and visual encoding capacity, ability to store and organize new information and ability to retain that information over period of delay.  There was some improvements in memory functions under recognition/cued recall suggesting that some of the real world day-to-day memory difficulties are not simply due to an inability to store and organize new information but also impacted by difficulty with free recall of information.  I suspect that some of his acute confusion and day-to-day memory lapses have more to do with retrieval and encoding weaknesses rather than primary storage and organization of new information.  The patient does show some attentional difficulties with lapses of attention that are significant but episodic in nature and also significant difficulties with sustaining attention, which is likely consistent with his premorbid history  of attention deficit disorder rather than a change due to his cerebrovascular issues.  As far as treatment recommendations, there are not a lot of active interventions available to address the cerebrovascular issues and are exacerbation of these issues from previous cardiovascular surgical interventions.  The patient does not have patterns consistent with a progressive degenerative condition such as Alzheimer's or frontotemporal dementia.  Particular emphasis should be maintained for risk related to hypertension and type 2 diabetes including diet and exercise regimens and efforts to address chronic sleeping issues.  I am not sure if he would benefit from a CPAP machine given his diagnosis of complex sleep apnea with both obstructive and central apneic events and I will leave consideration of this particular issue to  Dr. Vickey Huger as it is an area of her expertise.  The patient's anxiety and concern is likely continued to exacerbate and he identifies issues with depression and anxiety becoming more dominant with greater isolation and greater cognitive changes noted.  The patient does have a prescription for alprazolam, which he says he takes regularly for anxiety I do not find any current attempts at SSRI type medications and the only other psychotropic medication I found in the patient's medical records was description for amitriptyline back in 2015 due to sleeping issues.  The patient should also consider an addition of an SSRI medication which may be helpful for his anxiety and depressive symptoms as well.  Discussion was made regarding contributing factor of alprazolam can have toward short-term memory issues and we talked about looking at ways to address this with other alternatives but the patient continues to have significant anxiety and his anxiety exacerbates his memory difficulties as well.  I will sit down with the patient and go over the results of the current neuropsychological evaluation with more  specific recommendations for the patient particular around improved sleep hygiene and discussed current diagnostic considerations and need for special focus on managing his type 2 diabetes and sleeping issues as they are likely exacerbating and worsening his underlying cerebrovascular disease.  Diagnosis:    Mild neurocognitive disorder due to another medical condition, without behavioral disturbance  Ischemic brain damage  Amnestic MCI (mild cognitive impairment with memory loss)  Severe episode of recurrent major depressive disorder, without psychotic features (HCC)  Complex sleep apnea syndrome  Anxiety   _____________________ Arley Phenix, Psy.D. Clinical Neuropsychologist

## 2022-12-27 ENCOUNTER — Other Ambulatory Visit (HOSPITAL_COMMUNITY): Payer: Self-pay

## 2022-12-27 ENCOUNTER — Other Ambulatory Visit: Payer: Self-pay | Admitting: Internal Medicine

## 2022-12-28 ENCOUNTER — Other Ambulatory Visit (HOSPITAL_COMMUNITY): Payer: Self-pay

## 2022-12-28 MED ORDER — FLUTICASONE PROPIONATE 50 MCG/ACT NA SUSP
2.0000 | Freq: Every day | NASAL | 2 refills | Status: DC
  Filled 2022-12-28: qty 16, 30d supply, fill #0
  Filled 2023-01-24: qty 16, 30d supply, fill #1
  Filled 2023-02-11 – 2023-03-04 (×2): qty 16, 30d supply, fill #2

## 2022-12-31 ENCOUNTER — Other Ambulatory Visit (HOSPITAL_COMMUNITY): Payer: Self-pay

## 2022-12-31 DIAGNOSIS — F411 Generalized anxiety disorder: Secondary | ICD-10-CM | POA: Diagnosis not present

## 2023-01-03 ENCOUNTER — Other Ambulatory Visit (HOSPITAL_COMMUNITY): Payer: Self-pay

## 2023-01-06 ENCOUNTER — Other Ambulatory Visit (HOSPITAL_COMMUNITY): Payer: Self-pay

## 2023-01-06 ENCOUNTER — Other Ambulatory Visit: Payer: Self-pay | Admitting: Internal Medicine

## 2023-01-07 ENCOUNTER — Other Ambulatory Visit (HOSPITAL_COMMUNITY): Payer: Self-pay

## 2023-01-10 ENCOUNTER — Other Ambulatory Visit (HOSPITAL_COMMUNITY): Payer: Self-pay

## 2023-01-10 MED ORDER — ALPRAZOLAM 1 MG PO TABS
0.5000 mg | ORAL_TABLET | Freq: Four times a day (QID) | ORAL | 5 refills | Status: DC
  Filled 2023-01-10: qty 60, 30d supply, fill #0
  Filled 2023-02-11: qty 60, 30d supply, fill #1
  Filled 2023-03-16: qty 60, 30d supply, fill #2
  Filled 2023-04-18: qty 60, 30d supply, fill #3
  Filled 2023-05-19: qty 60, 30d supply, fill #4
  Filled 2023-06-21: qty 60, 30d supply, fill #5

## 2023-01-11 ENCOUNTER — Other Ambulatory Visit (HOSPITAL_BASED_OUTPATIENT_CLINIC_OR_DEPARTMENT_OTHER): Payer: Self-pay

## 2023-01-13 ENCOUNTER — Encounter: Payer: PPO | Admitting: Psychology

## 2023-01-24 ENCOUNTER — Other Ambulatory Visit (HOSPITAL_COMMUNITY): Payer: Self-pay

## 2023-01-26 DIAGNOSIS — F411 Generalized anxiety disorder: Secondary | ICD-10-CM | POA: Diagnosis not present

## 2023-02-02 DIAGNOSIS — H401131 Primary open-angle glaucoma, bilateral, mild stage: Secondary | ICD-10-CM | POA: Diagnosis not present

## 2023-02-02 DIAGNOSIS — H43811 Vitreous degeneration, right eye: Secondary | ICD-10-CM | POA: Diagnosis not present

## 2023-02-02 DIAGNOSIS — Z961 Presence of intraocular lens: Secondary | ICD-10-CM | POA: Diagnosis not present

## 2023-02-02 DIAGNOSIS — E119 Type 2 diabetes mellitus without complications: Secondary | ICD-10-CM | POA: Diagnosis not present

## 2023-02-02 DIAGNOSIS — H04123 Dry eye syndrome of bilateral lacrimal glands: Secondary | ICD-10-CM | POA: Diagnosis not present

## 2023-02-03 DIAGNOSIS — L821 Other seborrheic keratosis: Secondary | ICD-10-CM | POA: Diagnosis not present

## 2023-02-03 DIAGNOSIS — I788 Other diseases of capillaries: Secondary | ICD-10-CM | POA: Diagnosis not present

## 2023-02-03 DIAGNOSIS — L905 Scar conditions and fibrosis of skin: Secondary | ICD-10-CM | POA: Diagnosis not present

## 2023-02-03 DIAGNOSIS — L738 Other specified follicular disorders: Secondary | ICD-10-CM | POA: Diagnosis not present

## 2023-02-10 ENCOUNTER — Encounter: Payer: PPO | Admitting: Psychology

## 2023-02-10 ENCOUNTER — Other Ambulatory Visit (HOSPITAL_COMMUNITY): Payer: Self-pay

## 2023-02-10 MED ORDER — AMOXICILLIN 500 MG PO TABS
2000.0000 mg | ORAL_TABLET | ORAL | 2 refills | Status: DC
  Filled 2023-02-10: qty 12, 4d supply, fill #0

## 2023-02-11 ENCOUNTER — Other Ambulatory Visit (HOSPITAL_COMMUNITY): Payer: Self-pay

## 2023-02-17 ENCOUNTER — Ambulatory Visit (HOSPITAL_COMMUNITY): Payer: PPO | Attending: Cardiovascular Disease

## 2023-02-17 DIAGNOSIS — I351 Nonrheumatic aortic (valve) insufficiency: Secondary | ICD-10-CM | POA: Insufficient documentation

## 2023-02-17 LAB — ECHOCARDIOGRAM COMPLETE
AR max vel: 2.96 cm2
AV Area VTI: 3 cm2
AV Area mean vel: 2.44 cm2
AV Mean grad: 10 mmHg
AV Peak grad: 19.9 mmHg
Ao pk vel: 2.23 m/s
Area-P 1/2: 5.06 cm2
P 1/2 time: 606 ms
S' Lateral: 3.1 cm

## 2023-02-21 ENCOUNTER — Telehealth: Payer: Self-pay

## 2023-02-21 DIAGNOSIS — R0989 Other specified symptoms and signs involving the circulatory and respiratory systems: Secondary | ICD-10-CM

## 2023-02-21 DIAGNOSIS — I351 Nonrheumatic aortic (valve) insufficiency: Secondary | ICD-10-CM

## 2023-02-21 NOTE — Telephone Encounter (Signed)
Called patient with echo results. While on the phone patient wanted  Dr. Eden Emms to check his CT he had with Dr. Laneta Simmers at the beginning of the year. He stated it said something about "cardiac insufficiency". Patient also asked about having another carotid duplex. Informed patient that on his last one 09/2020, that Dr. Eden Emms would like him to have another one in 2 years. Placed order for carotid duplex. Patient also told me that he is going to grief counseling due to losing his brother in the beginning of the mouth to COVID and a PE. Patient stated it is okay to leave a detailed message on his phone [phone].

## 2023-02-21 NOTE — Telephone Encounter (Signed)
-----   Message from Charlton Haws sent at 02/18/2023  8:48 AM EDT ----- EF normal AR moderatre and stable f/u echo in a year

## 2023-02-22 NOTE — Telephone Encounter (Signed)
Called patient back and informed him of Dr. Eden Emms, recommendations.  Wendall Stade, MD  You16 hours ago (5:34 PM)    Didn't see anything about cardiac insufficiency on CT ok to order carotid duplex CT showed stable ascending aortic repair, and occluded false lumen which is good

## 2023-02-24 DIAGNOSIS — F411 Generalized anxiety disorder: Secondary | ICD-10-CM | POA: Diagnosis not present

## 2023-03-03 ENCOUNTER — Other Ambulatory Visit (HOSPITAL_COMMUNITY): Payer: Self-pay

## 2023-03-03 MED ORDER — AMOXICILLIN 500 MG PO TABS
500.0000 mg | ORAL_TABLET | Freq: Three times a day (TID) | ORAL | 0 refills | Status: DC
  Filled 2023-03-03: qty 25, 8d supply, fill #0

## 2023-03-04 ENCOUNTER — Encounter (HOSPITAL_COMMUNITY): Payer: PPO

## 2023-03-04 ENCOUNTER — Other Ambulatory Visit (HOSPITAL_COMMUNITY): Payer: Self-pay

## 2023-03-10 ENCOUNTER — Encounter: Payer: PPO | Attending: Psychology | Admitting: Psychology

## 2023-03-10 DIAGNOSIS — F332 Major depressive disorder, recurrent severe without psychotic features: Secondary | ICD-10-CM | POA: Diagnosis not present

## 2023-03-10 DIAGNOSIS — F0670 Mild neurocognitive disorder due to known physiological condition without behavioral disturbance: Secondary | ICD-10-CM | POA: Diagnosis not present

## 2023-03-10 DIAGNOSIS — G4739 Other sleep apnea: Secondary | ICD-10-CM

## 2023-03-10 DIAGNOSIS — I6782 Cerebral ischemia: Secondary | ICD-10-CM

## 2023-03-10 DIAGNOSIS — G4731 Primary central sleep apnea: Secondary | ICD-10-CM | POA: Diagnosis not present

## 2023-03-10 DIAGNOSIS — F419 Anxiety disorder, unspecified: Secondary | ICD-10-CM | POA: Diagnosis not present

## 2023-03-14 ENCOUNTER — Encounter: Payer: Self-pay | Admitting: Psychology

## 2023-03-14 NOTE — Progress Notes (Deleted)
Neuropsychological Evaluation   Patient:  [name]   DOB: Nov 21, 1950  MR Number: [id number]  Location: [facility] [place] [facility] [address] [place], [address] [place] [address] Dept: [phone]  Start: 8 AM End: 9 AM  Provider/Observer:     Ryan Coria PsyD  Chief Complaint:      Chief Complaint  Patient presents with   Memory Loss   Cerebrovascular Accident   Anxiety   Fatigue   Other    Word finding difficulties    Reason For Service:      [name] is a 72 year old male who was referred for neuropsychological evaluation by his primary care provider Ryan Danker, MD and the patient is also being followed by Ryan Novas, MD for neurological care.  This referral was due to ongoing cognitive changes post cardiac arrest with prolonged open heart surgery and anesthesia.  Short-term memory difficulties and episodic word finding issues are noted.  Patient has a past medical history that includes complex form of sleep apnea with partial obstructive and partial central apnea noted.  Patient does not use a CPAP machine.  Patient has had some concern for the past 3 years or so about dementia after his open heart surgery.  Patient describes difficulties remembering computer passwords, difficulty navigating his computer, difficulty remembering other people's names etc.  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  He is described himself previously as misspelling words that he had had no trouble with in the past, difficulties recalling other people's names etc.  It should be noted that the patient has described confusion and cognitive decline going back at least to 2015 and had an MRI conducted in 2017 because of cognitive decline.   Patient's past medical history also include anxiety, COPD, hypertension and migraine.   Fatigue, cognitive decline, peripheral neuropathy and changes in gait/stability are noted.  Patient has had sustained difficulties with generalized anxiety disorder for some time now.  Patient does have prescription for alprazolam but takes it on a limited basis and is aware of its impact it can have on memory.   During the clinical interview today, the patient reports that he has had persistent difficulties with memory and learning since his cardiac event and ongoing cardiac insufficiencies.  Patient reports that he is not sure whether his cognitive difficulties and memory issues are worsening or just continuing.  Patient describes issues like episodic confusion about his remote control as his phone or other short-term elements of confusion.  Patient reports that he is having difficulty distinguishing dream and real-life events.  Patient reports that he will walk around his house looking for his car keys while he has his keys in his hand.  The patient reports that he will forget who he has talked to recently within minutes after talking to them.  Patient reports he has noted going to turn his stereo off but actually turning off the lights.  Patient primarily describes memory difficulties around episodic memory versus semantic memory.  The patient reports that he is having some word finding difficulties and paraphasic errors.  The patient reports that sometimes the words will come to him a couple moments later.   The patient has described a previous cerebral ischemic injury and peripheral neuropathy as well as a lengthy surgical event for an aortic aneurysm repair.   The patient does report  that he was diagnosed with ADHD and had hyperkinetic and attentional difficulties in childhood.  The patient also was diagnosed with dyslexia, separation anxiety and excessive shyness as a child.  He did very well in school and completed his masters degree.    Current stressors include experiencing loneliness and aging  challenges, lack of meaningful relationships and recent diagnosis with tickborne infection resulting in alpha-gal related allergic reactions to beef, pork and lamb.   Medical History:                         Past Medical History:  Diagnosis Date   AAA (abdominal aortic aneurysm) (HCC)     ALLERGIC RHINITIS     Anemia      post op, treated /w Fe   Anxiety     Arthritis      OA, hands, - thumbs- specifically   Brachial plexopathy      double crush injury with median nerve damage on the right dominant hand.    COPD (chronic obstructive pulmonary disease) (HCC)      also states he has been told that he has slight emphysema    Dissecting aortic aneurysm, thoracic (HCC) 02/20/2012    s/p emergent surgical repair   DM2 (diabetes mellitus, type 2) (HCC)     DVT (deep venous thrombosis) (HCC)      coumadin- x3 months , post op   ERECTILE DYSFUNCTION, ORGANIC     GERD (gastroesophageal reflux disease)      related to diet & anxiety    HYPERLIPIDEMIA     Hypertension      pt. followwed by Dr. Eden Emms   MIGRAINE HEADACHE      induced from physical , emotional stress , but sometimes occur randomly, also experiences BPPV   OBSTRUCTIVE SLEEP APNEA 02/2008 sleep study    moderate & central- not able to tolerate CPAP    Thyroid nodule 04/21/2015    2.3 cm on right, incidental finding on 03/2015 CT chest - f/u US ordered   Unspecified vitamin D deficiency                                                             Patient Active Problem List    Diagnosis Date Noted   Allergic reaction 04/16/2022   Amnestic MCI (mild cognitive impairment with memory loss) 03/11/2022   Attention deficit 03/11/2022   Small vessel disease, cerebrovascular 03/11/2022   Ganglion cyst of both wrists 01/04/2022   Elevated LFTs 03/20/2020   Benign prostatic hyperplasia without lower urinary tract symptoms 03/20/2020   Skin lesion of scalp 03/20/2020   Pseudodementia 03/05/2020   Cerebral microvascular disease  03/05/2020   Severe episode of recurrent major depressive disorder, without psychotic features (HCC) 03/05/2020   Coronary artery calcification seen on CAT scan 03/17/2018   Ischemic brain damage 08/17/2017   Encounter for general adult medical examination with abnormal findings 07/18/2015   Thyroid nodule 04/21/2015   AAA (abdominal aortic aneurysm) without rupture (HCC) 04/15/2015   MCI (mild cognitive impairment) 08/26/2014   Peripheral neuropathy 03/21/2013   DM2 (diabetes mellitus, type 2) (HCC)     Brachial plexopathy     Anxiety     Occlusion and stenosis of carotid artery without mention of cerebral  infarction 04/11/2012   Vitamin D deficiency 12/17/2008   Hyperlipidemia associated with type 2 diabetes mellitus (HCC) 12/17/2008   OBSTRUCTIVE SLEEP APNEA 12/17/2008   Migraine 12/17/2008   ALLERGIC RHINITIS 12/17/2008   Asthma 12/17/2008    Additional Tests and Measures from other records:   Neuroimaging Results: Patient had MRI brain without contrast conducted on 08/16/2020 primarily due to history of migraine headaches.  This MRI was compared to an MRI from 2017.  During the 2022 MRI there was no evidence of recent infarction, hemorrhage or mass effect and there was minimal chronic microvascular ischemic changes since previous evaluation.  Chronic small vessel infarcts were noted and chronic microhemorrhages without substantial progression were also noted.  The patient also had NM Pet brain amyloid assessment conducted on 04/01/2022.  This was a negative assessment indicating only sparse or no neuritic beta amyloid plaques and was interpreted as inconsistent with the pathological diagnosis of Alzheimer's disease.  There was no increased florbetapir uptake seen in cortical cerebral gray matter.  The brain shows normal gray-white contrast throughout temporal lobes, parietal lobes and frontal lobes.  Cerebellum also showed no evidence of abnormal uptake.   Sleep: Patient has a documented  history of complex sleep apnea that has been noted to include both obstructive and central apneic features.  Patient reports that he typically gets between 4 and 5 hours of sleep and then wakes up and goes back to sleep for 2-3 more hours.  He has difficulty sleeping unless it is cold and when it is hot, he gets between 2 and 3 hours and wakes up multiple times and then may get 1 to 2-hour more sleep after returning to sleep.   Diet Pattern: Patient reports a good appetite but has been diagnosed with alpha gal and is adjusting to new diet with no mammal proteins.  The patient has tested positive for elevated immune reactions to beef pork and lamb.  Tests Administered: Controlled Oral Word Association Test (COWAT; FAS & Animals)  Grooved Pegboard Wechsler Adult Intelligence Scale, 4th Edition (WAIS-IV) Wechsler Memory Scale, 4th Edition (WMS-IV); Older Adult Battery  Comprehensive Attention Battery (CAB) Continuous Performance Test (CPT)  Participation Level:   Active  Participation Quality:  Appropriate      Behavioral Observation:  The patient appeared well-groomed and appropriately dressed. His manners were polite and appropriate to the situation. The patient's attitude towards testing was positive and his effort was good.   Well Groomed, Alert, and Appropriate.   Test Results:   Initially, an estimation was made as to the patient's premorbid intellectual and cognitive functioning.  The patient reports that he had been diagnosed with ADHD and hyperkinesis with attentional difficulties in childhood and also was diagnosed with dyslexia.  Patient had significant separation anxiety as a very young child.  However, he did very well in school and completed his masters degree being able to overcome his attentional issues and dyslexia symptoms.  The patient has had a number of high demand jobs requiring intellect and cognitive ability.  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  It is estimated that the patient's likely premorbid intellectual and cognitive abilities were at the very least in the high  average if not superior range relative to normative population.  We will utilize a conservative estimate for the patient to been roughly 1-1/2 standard deviations above normative values for comparison purposes and to have been functioning in the high average to superior range on various cognitive domains.  An estimation was also made as to the validity of the current assessment.  The patient appeared to try his hardest throughout the assessment and remained diligent with good effort throughout.  While formal validity measures were not conducted embedded validity checks were reviewed.  The patient did have some difficulties with primary auditory encoding but did better on more demanding measures requiring active processing of information in his active Register.  On 2 of the 3 recognition/forced choice memory items the patient showed significant improvement in performance without indications of purposefully showing any type of deficit.  This does appear to be a fair and valid assessment of the patient's current cognitive and intellectual abilities.  To provide a thorough assessment of a wide range of attentional and executive functioning domains the patient was administered the comprehensive attention battery and the CAB CPT measures.  This is a structured analysis of multiple domains of attention and concentration looking at both auditory and visual attentional components.  Initially, the patient was administered the pure auditory and pure visual reaction time measures.  On the pure visual  reaction time measure the patient had a significant elevation of errors of omission but review of raw data suggest that this primarily had to do with initial difficulties acclimating to the touch screen use or interface that required pressure versus electro static inputs.  The patient's average response time on pure visual reaction time measures were within normal limits with no indications of slowed information processing speed.  The same pattern was also noted for the pure auditory reaction time with average response times well within normative expectations but an elevated number of errors of omission.  These omissions did appear to be primarily related to difficulty adjusting to use or interface.  The patient was then administered discriminant reaction time measures.  The patient did much better as far as use of the touch screen interface showing significant reduction in errors of omission.  On the visual discriminate reaction time measure the patient correctly identified 33 of 35 targets with 1 error of commission and 2 errors of omission.  This performances well within normative expectations.  The patient's average response time was 444 ms which is also within normative expectations.  On the auditory discriminate reaction time measure the patient performed equally as well.  He had correct responses to 34 of 35 targets with 1 error of commission and 1 error of omission.  Average response times were well within normative expectations averaging 734 ms.  On the mixed discriminate reaction time, the patient had much greater difficulty.  He struggled understanding the instructions and being able to shift attention back between targeted auditory or targeted visual stimuli.  He correctly responded to 7 of 30 targets with 7 errors of commission and 23 errors of omission.  The instructions were given multiple times and he would show understanding with the instructional phase but then had significant difficulties when  the requirements of the task demanded shifting between auditory versus visual target stimuli.  While the patient's average response time for correct items was within normal limits he showed significant deficits with regard to shifting attention.  The patient was then administered the auditory/visual scan reaction time measures.  On the visual  scan reaction time measure the patient again showed increased levels of errors of omission.  The patient correctly identified 33 of 40 targets with 7 errors of omission on the visual scan measure with average response times within normal limits.  The patient showed the same type of pattern for the auditory scan reaction time measure correctly responding to 32 of 40 targets with 8 errors of commission.  On the mixed scan measure the patient performed better correctly responded to 38 of 40 targets with 2 errors of omission.  All of his average response times were within normal limits.  The patient was then administered the auditory/visual encoding measures.  On the auditory forward measure the patient performed at the upper end of the average range but had some difficulty on the auditory backwards showing some difficulty with processing and manipulating information in his primary auditory Register.  The patient did better on visual encoding measures for both forwards and backwards performing the upper end of the average range for visual encoding.  Patient was then administered the Stroop interference cancellation test.  This measures divided into sections.  Is a primary focus execute measure assessing visual information processing speed with visual scanning and visual attention requirements.  The patient started off very efficiently and the 9 interference trials which showed some deterioration as a function of time.  This measure is then transitioned into a targeted interference trial where the patient continues to do the same process but has a targeted auditory  interference applied in the background.  The patient initially had significant difficulty remaining free from distraction but quickly adjusted to this and by the last 3 trials of the targeted interference he had his best sequence of performance all of which were in the average to upper end of the average range relative to a normative population.  The patient did appear to be able to avoid significant impact or distraction from external distractors.  Finally, as part of this in-depth analysis of attention and executive functioning the patient was administered the visual monitor CPT measure from the comprehensive attention battery.  This is a 15-minute continuous performance measure that is broken down into five 3-minute blocks of time for analysis.  On the first 3 minutes of this measure the patient correctly identified 30 of 30 targets with 2 errors of commission and no errors of omission.  Average response time was 480 ms all of which are within normal limits and consistent with efficient performance on the visual discriminate reaction time measure performed earlier in this battery.  The patient continues to perform well through the first 6 minutes of this CPT measure for both accuracy and response time measures.  However, the patient's accuracy scores remained relatively good with only mild elevations of impulsive responses relative to a normative population he showed significant deterioration in information processing speed as a function of time.  For example on the 6-9-minute mark his average response time  deteriorated to 1170 ms required for each, which is more than double time the response time needed and either the 0-3-minute mark or 3-6-minute mark.  This pattern of slowed information processing speed continued throughout the remainder of the task without a significant increase in errors of commission.  The patient did not have any errors of omission during the final 9 minutes of this measure.  However,  the significant and sustained deterioration in information processing speed after the first 6 minutes of the task do suggest deficits with regard to sustaining attention and patterns  consistent with his diagnosis of attention deficit disorder as a child.  COWAT:  FAS total= 39 Z= 0.04  Animals total= 24 Z= 1.36  The patient was then administered the control or word association test as he had described difficulties with word finding and fluency capacity.  The patient performed in the average range for the FAS test, which is a measure of lexical fluency when compared to age, gender and education matched comparison group.  The patient did even better on measures of semantic fluency performing almost 1 and half standard deviation above age, gender and education matched comparison group.  There did not appear to be any objective findings of lexical or semantic fluency deficits when compared against matched normative comparisons.   Grooved Pegboard: R (DH) time= 157s Percentile Rank= 23rd  L (NDH) time= 116s Percentile Rank= 38th   The patient was also given the grooved pegboard test which assesses fine motor control.  On this measure the patient performed well for both his right dominant hand and left nondominant hand and while they were somewhat below the 50th percentile they were still within normal limits.  There was some indication of significant lateralization of weaknesses as he showed greater relative weaknesses with regard to his right dominant hand and actually performed more slowly with his dominant hand versus nondominant hand.  WAIS-IV:           Composite Score Summary          Scale Sum of Scaled Scores Composite Score Percentile Rank 95% Conf. Interval Qualitative Description  Verbal Comprehension 37 VCI 112 79 106-117 High Average  Perceptual Reasoning 29 PRI 98 45 92-104 Average  Working Memory 16 WMI 89 23 83-96 Low Average  Processing Speed 16 PSI 89 23 82-98 Low Average   Full Scale 98 FSIQ 98 45 94-102 Average  General Ability 66 GAI 105 63 100-110 Average    The patient was then administered the Wechsler Adult Intelligence Scale to provide a thorough assessment of a wide range of cognitive domains and a highly structured excellently normed battery of measures.  This measure was primarily used to allow for efficient repeat testing as it is highly standardized and can be administered in a structured way allowing for comparisons over time.  It should also be noted that the patient is describing changes in cognitive functioning and the current results should not be used as an estimation of premorbid intellectual and cognitive functioning but a description of his current status.  2 composite scores were calculated with this measure.  The patient produced a full-scale IQ score of 98 which falls at the 45th percentile and is in the average range.  While this is not a significantly impaired score relative to a normative population it is likely at least a decrease of 1 and half standard deviations in performance from the patient's predicted premorbid capacity.  We also calculated the patient's general abilities index score which places less emphasis on elements such as auditory encoding and working memory as well as information processing speed measures and states tend to be the most vulnerable to acute change.  The patient produced a general abilities index score of 105 which falls at the 63rd percentile and is in the average range but still roughly 15 points/1 standard deviation below predicted levels of premorbid function.  This overall pattern would be consistent with some global loss in 2 or more cognitive domains.           Verbal Comprehension Subtests  Summary        Subtest Raw Score Scaled Score Percentile Rank Reference Group Scaled Score SEM  Similarities 27 11 63 11 1.04  Vocabulary 45 12 75 13 0.67  Information 22 14 91 16 0.73   The patient produced a verbal  comprehension index score of 112 which falls at the 79th percentile and in the high average range of functioning relative to a normative population.  This is only slightly below predicted levels of premorbid functioning does not indicate any significant loss of function in this domain.  The patient performed in the average range on measures of verbal reasoning and problem-solving.  He performed consistent with premorbid estimates for both his vocabulary knowledge as well as his general fund of information and affected quite well on measures of his general fund of information.           Perceptual Reasoning Subtests Summary        Subtest Raw Score Scaled Score Percentile Rank Reference Group Scaled Score SEM  Block Design 40 12 75 9 1.08  Matrix Reasoning 9 7 16 4  0.90  Visual Puzzles 11 10 50 7 0.85   The patient produced a perceptual reasoning index score of 98 which falls at the 45th percentile and is in the average range relative to a normative population.  There was some significant scatter within subtest performance with the patient doing well on measures of visual analysis and organizational skills and his ability to analyze geometric patterns with hold part recognition skills.  The patient showed significant weakness on measures of nonverbal reasoning and problem-solving capacity.  Patient performed in the average range on measures of attention to detail and fluid reasoning skills.  This does suggest some potential difficulties and loss with regard to his nonverbal reasoning skills consistent with some potential weaknesses with his verbal reasoning skills which were also down relative to premorbid estimates.          Working Comptroller Raw Score Scaled Score Percentile Rank Reference Group Scaled Score SEM  Digit Span 19 7 16 5  0.79  Arithmetic 13 9 37 9 0.99    The patient produced a working memory index score of 89 which falls at the 23rd percentile and is in  the low average range relative to a normative population this performance is generally consistent with his performance on auditory encoding measures from the comprehensive attention battery and suggest some weakness relative to premorbid functioning with primary auditory encoding capacity but improvements on functions where he is asked to process information in his active auditory Register.  However, these do represent some weaknesses in this area relative to premorbid estimates.          Processing Speed Subtests Summary        Subtest Raw Score Scaled Score Percentile Rank Reference Group Scaled Score SEM  Symbol Search 18 7 16 5  1.31  Coding 50 9 37 6 0.99    The patient produced a processing speed index score of 89 which falls at the 23rd percentile and in the low average range relative to a normative population.  This is significantly below estimates of premorbid capacity with the patient showing average to low average performance on measures of visual scanning, visual search and overall speed of mental operations.  Slowed psychomotor speed were noted on both measures relative to premorbid estimates as I suspect the patient had done quite well on these measures historically.  WMS-IV:          Index Score Summary        Index Sum of Scaled Scores Index Score Percentile Rank 95% Confidence Interval Qualitative Descriptor  Auditory Memory (AMI) 39 98 45 92-104 Average  Visual Memory (VMI) 16 89 23 84-94 Low Average  Immediate Memory (IMI) 29 97 42 91-103 Average  Delayed Memory (DMI) 26 91 27 84-99 Average     The patient was then administered the Wechsler Memory Scale-IV for older adults to provide an objective assessment of a wide range of learning and memory capacity.  The patient has likely done very well in this cognitive domains throughout his life and his current performances while following in the average range predominantly were likely significantly below premorbid functioning  capacity.  The patient showed mild relative weakness and performances below premorbid estimates for auditory encoding but appeared to do somewhat better on measures of visual encoding capacity.  While neither auditory or visual encoding capacity were significantly impaired they were below estimates of premorbid functioning.  This level of encoding capacity would have some impact on his ability to store and organize new information but not to the point that would leave him unable to learn new information.  Breaking memory components down between auditory versus visual memory the patient produced an auditory memory index score of 98 which falls at the 45th percentile and is in the average range.  This is roughly 1-1/2 standard deviation below premorbid estimations.  The patient showed greater difficulty with regard to visual memory producing a visual memory index score of 89 which fell at the 23rd percentile and in the low average range relative to normative population.  Breaking memory functions down between immediate versus delayed memory the patient produced an immediate memory index score of 97 which falls at this 42nd percentile and in the average range.  This is somewhat below estimates of premorbid functioning and suggest some difficulties or changes in his capacity to store and organize new information but is generally consistent with his current level of encoding capacity.  The patient showed greater difficulties with delayed memory producing a delayed memory index score of 91 which falls at the 27th percentile relative to a normative population.  Looking deeper into delayed memory storage and retrieval the patient was administered a number of recognition/cued recall measures.  The patient showed little improvement four-story learning when placed in a cued recall/recognition format.  However, he showed significant improvements under verbal paired learning domains for cued recall and also showed  significant improvements in visual memory under cued recall/recognition formats.  This pattern would suggest at least some degree of memory deficits or changes particularly for delayed memory related to free recall of information rather than a true inability to store and organize new information.  While his storage and organization of information is likely below premorbid estimates by roughly 1-1 and half standard deviation some of his memory weaknesses are related to retrieval rather than storage and organization of new information.         Primary Subtest Scaled Score Summary       Subtest Domain Raw Score Scaled Score Percentile Rank  Logical Memory I AM 24 7 16   Logical Memory II AM 14 8 25   Verbal [facility] I AM 29 13 84  Verbal [facility] II AM 7 11 63  Visual Reproduction I VM 30 9 37  Visual Reproduction II VM 10 7 16   Symbol Span VWM 19 10  50          Auditory Memory Process Score Summary      Process Score Raw Score Scaled Score Percentile Rank Cumulative Percentage (Base Rate)  LM II Recognition 14 - - <=2%  VPA II Recognition 30 - - >75%           Visual Memory Process Score Summary      Process Score Raw Score Scaled Score Percentile Rank Cumulative Percentage (Base Rate)  VR II Recognition 5 - - 51-75%     Impression/Diagnosis:   The results of the current neuropsychological evaluation are not consistent with those typically seen with progressive degenerative condition such as Alzheimer's, frontotemporal dementia, Lewy body etc.  And are much more consistent with cerebrovascular changes due to history of type 2 diabetes, microvascular ischemic changes, previous small vessel disease and chronic small vessel infarcts bilaterally and potential subcortical white matter involvement in as well as thalamic and cerebellum regions related to chronic microhemorrhages.  The patient has been describing concerns around his memory and learning another cognitive decline  issues dating back to at least 2015 with MRI conducted in 2017 because of concerns around cognitive decline.  Patient has a significant past medical history related to hypertension, migraine, neuropathy and changes in gait reported.  Patient has a history of difficult to treat obstructive and central apnea noted without CPAP use.  Patient has had significant cardiovascular deficits with previous open heart surgery and worsening of symptoms post open heart surgery.  He has ongoing issues with cardiac insufficiencies with previous cerebral ischemic injury and peripheral neuropathy with lengthy surgical event for an aortic aneurysm repair.  Anxiety and mood changes are also noted as well as significant ongoing sleeping difficulties.  MRI and pet imaging of beta amyloid neuritic plaque status were both inconsistent with findings typically seen with Alzheimer's type diseases as well.  The patient does have cognitive changes and cognitive loss in a number of domains including the efficiency with which she is learning new information and difficulties/changes with his premorbid auditory and visual encoding capacity, ability to store and organize new information and ability to retain that information over period of delay.  There was some improvements in memory functions under recognition/cued recall suggesting that some of the real world day-to-day memory difficulties are not simply due to an inability to store and organize new information but also impacted by difficulty with free recall of information.  I suspect that some of his acute confusion and day-to-day memory lapses have more to do with retrieval and encoding weaknesses rather than primary storage and organization of new information.  The patient does show some attentional difficulties with lapses of attention that are significant but episodic in nature and also significant difficulties with sustaining attention, which is likely consistent with his premorbid history  of attention deficit disorder rather than a change due to his cerebrovascular issues.  As far as treatment recommendations, there are not a lot of active interventions available to address the cerebrovascular issues and are exacerbation of these issues from previous cardiovascular surgical interventions.  The patient does not have patterns consistent with a progressive degenerative condition such as Alzheimer's or frontotemporal dementia.  Particular emphasis should be maintained for risk related to hypertension and type 2 diabetes including diet and exercise regimens and efforts to address chronic sleeping issues.  I am not sure if he would benefit from a CPAP machine given his diagnosis of complex sleep apnea with both obstructive and central apneic events  and I will leave consideration of this particular issue to Dr. Vickey Huger as it is an area of her expertise.  The patient's anxiety and concern is likely continued to exacerbate and he identifies issues with depression and anxiety becoming more dominant with greater isolation and greater cognitive changes noted.  The patient does have a prescription for alprazolam, which she says he takes sparingly but I do not find any current attempts at SSRI type medications and the only other psychotropic medication I found in the patient's medical records was description for amitriptyline back in 2015 due to sleeping issues.  The patient should also consider an addition of an SSRI medication which may be helpful for his anxiety and depressive symptoms as well.  I will sit down with the patient and go over the results of the current neuropsychological evaluation with more specific recommendations for the patient particular around improved sleep hygiene and discussed current diagnostic considerations and need for special focus on managing his type 2 diabetes and sleeping issues as they are likely exacerbating and worsening his underlying cerebrovascular  disease.  Diagnosis:    Mild neurocognitive disorder due to another medical condition, without behavioral disturbance  Ischemic brain damage  Severe episode of recurrent major depressive disorder, without psychotic features (HCC)  Complex sleep apnea syndrome  Anxiety   _____________________ Arley Phenix, Psy.D. Clinical Neuropsychologist

## 2023-03-14 NOTE — Progress Notes (Signed)
Neuropsychological Evaluation   Patient:  [name]   DOB: [birth year]  MR Number: [id number]  Location: [facility] [address] [place], [address] [place] [address] Dept: [phone]  Start: 4 PM End: 5 PM  Provider/Observer:     Hershal Coria PsyD  Chief Complaint:      Chief Complaint  Patient presents with   Memory Loss   Cerebrovascular Accident   Anxiety   Fatigue   Other    Word finding difficulties    02/07/2023 4 PM-5 PM: Today I provided feedback regarding the results of the recent neuropsychological evaluation.  We went over causative factors and address ways to try to compensate and adjust for ongoing memory difficulties.  We addressed issues related to his use of alprazolam for his anxiety disorder and the give-and-take of anxiety impacting attention and memory versus alprazolam impacting attention and memory.  We also set up plan going forward and will continue address compensation strategies.  I have included the reason for service and summary of formal neuropsychological evaluation below for convenience and is complete neuropsychological evaluation can be found in the patient's EMR dated 12/14/2022.  Reason For Service:      [name] is a 72 year old male who was referred for neuropsychological evaluation by his primary care provider [name], MD and the patient is also being followed by [name], MD for neurological care.  This referral was due to ongoing cognitive changes post cardiac arrest with prolonged open heart surgery and anesthesia.  Short-term memory difficulties and episodic word finding issues are noted.  Patient has a past medical history that includes complex form of sleep apnea with partial obstructive and partial central apnea noted.  Patient does not use a CPAP machine.  Patient has had some concern for the past 3 years or so  about dementia after his open heart surgery.  Patient describes difficulties remembering computer passwords, difficulty navigating his computer, difficulty remembering other people's names etc.  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  He is described himself previously as misspelling words that he had had no trouble with in the past, difficulties recalling other people's names etc.  It should be noted that the patient has described confusion and cognitive decline going back at least to 2015 and had an MRI conducted in 2017 because of cognitive decline.   Patient's past medical history also include anxiety, COPD, hypertension and migraine.  Fatigue, cognitive decline, peripheral neuropathy and changes in gait/stability are noted.  Patient has had sustained difficulties with generalized anxiety disorder for some time now.  Patient does have prescription for alprazolam regular basis and is aware of its impact it can have on memory.   During the clinical interview today, the patient reports that he has had persistent difficulties with memory and learning since his cardiac event and ongoing cardiac insufficiencies.  Patient reports that he is not sure whether his cognitive difficulties and memory issues are worsening or just continuing.  Patient describes issues like episodic confusion about his remote control as his phone or other short-term elements of confusion.  Patient reports that he is having difficulty distinguishing dream and real-life events.  Patient reports that he will walk around his house looking for his car keys while he has his keys in his hand.  The patient reports that he will forget  who he has talked to recently within minutes after talking to them.  Patient reports he has noted going to turn his stereo off but actually turning off the lights.  Patient primarily describes memory difficulties around episodic memory versus  semantic memory.  The patient reports that he is having some word finding difficulties and paraphasic errors.  The patient reports that sometimes the words will come to him a couple moments later.   The patient has described a previous cerebral ischemic injury and peripheral neuropathy as well as a lengthy surgical event for an aortic aneurysm repair.   The patient does report that he was diagnosed with ADHD and had hyperkinetic and attentional difficulties in childhood.  The patient also was diagnosed with dyslexia, separation anxiety and excessive shyness as a child.  He did very well in school and completed his masters degree.    Current stressors include experiencing loneliness and aging challenges, lack of meaningful relationships and recent diagnosis with tickborne infection resulting in alpha-gal related allergic reactions to beef, pork and lamb.    Impression/Diagnosis:   The results of the current neuropsychological evaluation are not consistent with those typically seen with progressive degenerative condition such as Alzheimer's, frontotemporal dementia, Lewy body etc.  And are much more consistent with cerebrovascular changes due to history of type 2 diabetes, microvascular ischemic changes, previous small vessel disease and chronic small vessel infarcts bilaterally and potential subcortical white matter involvement in as well as thalamic and cerebellum regions related to chronic microhemorrhages.  The patient has been describing concerns around his memory and learning another cognitive decline issues dating back to at least 2015 with MRI conducted in 2017 because of concerns around cognitive decline.  Patient has a significant past medical history related to hypertension, migraine, neuropathy and changes in gait reported.  Patient has a history of difficult to treat obstructive and central apnea noted without CPAP use.  Patient has had significant cardiovascular deficits with previous open heart  surgery and worsening of symptoms post open heart surgery.  He has ongoing issues with cardiac insufficiencies with previous cerebral ischemic injury and peripheral neuropathy with lengthy surgical event for an aortic aneurysm repair.  Anxiety and mood changes are also noted as well as significant ongoing sleeping difficulties.  MRI and pet imaging of beta amyloid neuritic plaque status were both inconsistent with findings typically seen with Alzheimer's type diseases as well.  The patient does have cognitive changes and cognitive loss in a number of domains including the efficiency with which she is learning new information and difficulties/changes with his premorbid auditory and visual encoding capacity, ability to store and organize new information and ability to retain that information over period of delay.  There was some improvements in memory functions under recognition/cued recall suggesting that some of the real world day-to-day memory difficulties are not simply due to an inability to store and organize new information but also impacted by difficulty with free recall of information.  I suspect that some of his acute confusion and day-to-day memory lapses have more to do with retrieval and encoding weaknesses rather than primary storage and organization of new information.  The patient does show some attentional difficulties with lapses of attention that are significant but episodic in nature and also significant difficulties with sustaining attention, which is likely consistent with his premorbid history of attention deficit disorder rather than a change due to his cerebrovascular issues.  As far as treatment recommendations, there are not a lot of active interventions  available to address the cerebrovascular issues and are exacerbation of these issues from previous cardiovascular surgical interventions.  The patient does not have patterns consistent with a progressive degenerative condition such as  Alzheimer's or frontotemporal dementia.  Particular emphasis should be maintained for risk related to hypertension and type 2 diabetes including diet and exercise regimens and efforts to address chronic sleeping issues.  I am not sure if he would benefit from a CPAP machine given his diagnosis of complex sleep apnea with both obstructive and central apneic events and I will leave consideration of this particular issue to Dr. Vickey Huger as it is an area of her expertise.  The patient's anxiety and concern is likely continued to exacerbate and he identifies issues with depression and anxiety becoming more dominant with greater isolation and greater cognitive changes noted.  The patient does have a prescription for alprazolam, which he says he takes regularly for anxiety I do not find any current attempts at SSRI type medications and the only other psychotropic medication I found in the patient's medical records was description for amitriptyline back in 2015 due to sleeping issues.  The patient should also consider an addition of an SSRI medication which may be helpful for his anxiety and depressive symptoms as well.  Discussion was made regarding contributing factor of alprazolam can have toward short-term memory issues and we talked about looking at ways to address this with other alternatives but the patient continues to have significant anxiety and his anxiety exacerbates his memory difficulties as well.  I will sit down with the patient and go over the results of the current neuropsychological evaluation with more specific recommendations for the patient particular around improved sleep hygiene and discussed current diagnostic considerations and need for special focus on managing his type 2 diabetes and sleeping issues as they are likely exacerbating and worsening his underlying cerebrovascular disease.  Diagnosis:    Mild neurocognitive disorder due to another medical condition, without behavioral  disturbance  Ischemic brain damage  Severe episode of recurrent major depressive disorder, without psychotic features (HCC)  Complex sleep apnea syndrome  Anxiety   _____________________ Arley Phenix, Psy.D. Clinical Neuropsychologist

## 2023-03-15 ENCOUNTER — Encounter: Payer: Self-pay | Admitting: Adult Health

## 2023-03-15 ENCOUNTER — Ambulatory Visit: Payer: PPO | Admitting: Adult Health

## 2023-03-15 VITALS — BP 127/67 | HR 68 | Ht 75.75 in | Wt 225.0 lb

## 2023-03-15 DIAGNOSIS — R413 Other amnesia: Secondary | ICD-10-CM | POA: Diagnosis not present

## 2023-03-15 NOTE — Progress Notes (Signed)
PATIENT: [name] DOB: 12-Jan-1951  REASON FOR VISIT: follow up HISTORY FROM: patient PRIMARY NEUROLOGIST: Dr. Vickey Huger  Chief Complaint  Patient presents with   RM 4    Patient is here alone for follow-up of memory. He had formal memory testing with Dr Kieth Brightly this year. The patient feels like he is holding his own compared to a year ago.      HISTORY OF PRESENT ILLNESS: Today 03/15/23:   [name] is a 72 y.o. male with a history of memory disturbance. Returns today for follow-up.  Feels that memory is about the same. Occasionally he does "weird" things like turning the radio on instead of the tv.  Overall he feels stable.  He did have neuropsychological testing with Dr. Kieth Brightly.  His findings are below.  Patient is trying to manage stroke risk factors.  At home he is able to complete all ADLs independently.  Operates a Librarian, academic.  Neuropsychological testing: The results of the current neuropsychological evaluation are not consistent with those typically seen with progressive degenerative condition such as Alzheimer's, frontotemporal dementia, Lewy body etc. And are much more consistent with cerebrovascular changes due to history of type 2 diabetes, microvascular ischemic changes, previous small vessel disease and chronic small vessel infarcts bilaterally and potential subcortical white matter involvement in as well as thalamic and cerebellum regions related to chronic microhemorrhages.     03/11/22 (Copied from Dr.Dohmeier's note) [name] is a 72 y.o. male  - here for memory concerns- and repeat MOCA test : Today is 11 March 2022 and I have the pleasure to meet with [name]. [name] after 2-year hiatus.  He followed up with our NP Gean Larose in the interval, last seen 03-11-2021.  He is a meanwhile 72 year old man, who has been worried about cognitive decline for quite a while but our suspicion has been that he had a pseudodementia more or  less a cognitive misperception about his own abilities due to either depression or anxiety.  His MoCA test today was 30 out of 30 points he is also providing fluent word listing, good visual-spatial and executive function attention and delayed recall and orientation all were intact. In "real life" he experiences too many inputs, not able to multitask, easily loosing traction. Focus shifts. He has 'blips" memory loss for seconds- forgetting that he took his meds. He forgets the beginning of a movie and then is unable to understand the whole story line.   Memory problems started after cardiac arrest, prolonged open heart surgery and anesthesia.  Episodic work finding issues.  Takes diabetic meds and HTN,  Right arm is clumsier he feels, may be just weaker, feet and ankle stiffness, getting spasms.   03/11/21 (MM): [name] is a 72 y.o. male with a history of pseudodementia, idiopathic neuropathy. He is here today for a follow up. Patient endorses 2 episodes of global amnesia during activities such as driving. One was 25 years ago and he feels it may have been more of a distraction vs. Amensia. MOCA today was 28/30. Patient previously had an intense migraine resulting in an inability to speak, think clearly, and was unable to use his electronic devices. During the peak of this event he experienced left hand numbness. Following this event he reached out to his PCP and had an MRI completed in march. The MRI demonstrated no new infarcts, hemmorhage, or mass. He endorses that his migraines are inconsistent and has noticed that mental, emotional, or physical  stress are triggers. He reports constant numbness in his feet, no significant changes at this time.  He has purchased capsaicin cream but has not used it yet.  He stated that he has tried SSRI's in the past for depression and anxiety and they have been unsuccessful and he experiences undesired side effects. He defers antidepressant medications at this  time.   HISTORY Copied from Dr. Vickey Huger 03/05/20 [name] has followed here for peripheral nerve injuries as well as for a complex form of sleep apnea that was partially obstructive and partially central, and he could not use CPAP. All this culminated after open heart surgery. He is meanwhile 72 years of age and will turn 28 this November. He worries about dementia ever since heart surgery- " pump brain" He has difficulties with computer access, remembering password, user names, etc. 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 Forgot his daughter's birthday, has been misspelling words that didn't give him trouble before.  He cannot recognize faces- but recalls names (Anosognosia) !  He recently mistook his cell phone for the TV control. He feels fatigued, not refreshed or restored.  Dr. Felicity Coyer prescribed Xanax and this has calmed him, helps with sleep. But he is worried about his cognitive function. CPAP failed him, chin strap very uncomfortable -he wants to learn about dental or ENT treatments.  His OSA is resolved , his last sleep test could not detect any OSA - 10-28-2017  , sill some Anxiety, COPD, Hypertension, and Migraine Headache. He has been reporting muscle cramping, morning stiffness- " I feel like hell" - even more fatigue, cognitive decline, he became very lonely over the time of the pandemic, but he also has recently become a grandfather again and his family life is happy.  He does endorse a high score on the geriatric depression score 11 out of 15 points of clinical depression.  He states that his neuropathy has progressed and his feet are profoundly numb and affect his gait stability.  Also, he reports inability to recall password, to reboot and fix his computer, has again lifted the remote to answer the phone- he is concerned.    I believe he has pseudodementia. He is too worried and anxious to partake in activities that would give him pleasure. He is indecisive, and has  less attention to detail, is hyper- focussed on what could go wrong.   REVIEW OF SYSTEMS: Out of a complete 14 system review of symptoms, the patient complains only of the following symptoms, and all other reviewed systems are negative.  MOCA 28/30  ALLERGIES: Allergies  Allergen Reactions   Alpha-Gal Anaphylaxis    avoid all beef, pork, venison, rabbit and lamb.  You should also avoid gelatin.   Testosterone Shortness Of Breath    Medication:Androgel Pt states trouble breathing at night   Codeine Nausea And Vomiting   Dapagliflozin Other (See Comments)   Gabapentin     "drunk" feeling   Glycerin    Heparin    Other     "Cat gut sutures"  "Bone char"   Ultram [Tramadol] Nausea And Vomiting   Albuterol Other (See Comments)    Panic attack    HOME MEDICATIONS: Outpatient Medications Prior to Visit  Medication Sig Dispense Refill   ALPRAZolam (XANAX) 1 MG tablet Take 1/2 tablet (0.5 mg total) by mouth 4 (four) times daily. 60 tablet 5   amoxicillin (AMOXIL) 500 MG tablet Take 4 tablets (2,000 mg total) by mouth 1  hour before dental procedure, save remaining tablets for future appointments 12 tablet 2   amoxicillin (AMOXIL) 500 MG tablet Take 4 tablets (2,000 mg total) by mouth 1 hour prior to procedure, then take 1 tablet (500 mg total) by mouth every 8 (eight) hours until gone starting the following day 25 tablet 0   aspirin EC 81 MG tablet Take 81 mg by mouth daily with lunch.     azelastine (ASTELIN) 0.1 % nasal spray Place 2 sprays into both nostrils 2 (two) times daily. Office visit is due 30 mL 0   bisacodyl (DULCOLAX) 10 MG suppository Place 1 suppository (10 mg total) rectally daily as needed for moderate constipation. 12 suppository 0   calcium carbonate (TUMS EX) 750 MG chewable tablet Chew 1 tablet by mouth daily as needed for heartburn.      Cetirizine HCl (ZYRTEC PO) Take by mouth.     chlorhexidine (PERIDEX) 0.12 % solution RINSE MOUTH WITH (1 CAPFUL) FOR 30  SECONDS AM AND PM AFTER TOOTHBRUSHING. SPIT  AFTER RINSING, DO NOT SWALLOW 473 mL 2   cholecalciferol (VITAMIN D) 1000 UNITS tablet Take 2,000 Units by mouth daily.     EPINEPHrine 0.3 mg/0.3 mL IJ SOAJ injection INJECT 0.3 MLS INTO THE MUSCLE ONCE AS DIRECTED 2 each 3   fexofenadine (ALLEGRA) 180 MG tablet Take 90 mg by mouth 2 (two) times daily.     fluticasone (FLONASE) 50 MCG/ACT nasal spray Place 2 sprays into both nostrils daily. 16 g 2   hydrochlorothiazide (HYDRODIURIL) 25 MG tablet Take 1 tablet (25 mg total) by mouth daily. ANNUAL APPT DUE IN OCT MUST SEE PROVIDER FOR FUTURE REFILLS 90 tablet 1   HYDROcodone-acetaminophen (NORCO/VICODIN) 5-325 MG tablet TAKE 1 TABLET EVERY 6 HOURS AS NEEDED FOR PAIN. 8 tablet 0   ibuprofen (ADVIL) 600 MG tablet TAKE 1 TABLET EVERY 6 HOURS AS NEEDED. 20 tablet 0   magic mouthwash (nystatin, hydrocortisone, diphenhydrAMINE) suspension Swish with 35ml's by mouth then spit out 2 times a day 140 mL 0   metFORMIN (GLUCOPHAGE-XR) 500 MG 24 hr tablet Take 3 tablets (1,500 mg total) by mouth daily with breakfast. 270 tablet 3   metoprolol tartrate (LOPRESSOR) 25 MG tablet Take 1 tablet (25 mg total) by mouth 3 (three) times daily. 270 tablet 3   montelukast (SINGULAIR) 10 MG tablet Take 1/2 tablet (5 mg total) by mouth 2 (two) times daily. 90 tablet 1   Multiple Vitamin (MULTIVITAMIN WITH MINERALS) TABS Take 1 tablet by mouth daily.     mupirocin ointment (BACTROBAN) 2 % Apply topically 3 (three) times daily. 22 g 0   omega-3 acid ethyl esters (LOVAZA) 1 g capsule Take 1 capsule (1 g total) by mouth 2 (two) times daily. 180 capsule 3   OVER THE COUNTER MEDICATION Apply 1 application topically daily as needed (for acne). Oxy 10 vanishing cream     polyethylene glycol powder (GLYCOLAX/MIRALAX) powder MIX 17G WITH LIQUID AND DRINK BY MOUTH DAILY. 527 g 5   Prednisol Ace-Moxiflox-Bromfen 1-0.5-0.075 % SUSP Place 1 drop into the left eye 4 (four) times daily.      PREVIDENT 5000 DRY MOUTH 1.1 % GEL dental gel APPLY THIN RIBBON AND BRUSH FOR 2 MINUTES DAILY IN PLACE OF CONVENTIONAL TOOTHPASTE  99   rosuvastatin (CRESTOR) 10 MG tablet Take 1 tablet (10 mg total) by mouth daily. 90 tablet 3   sodium fluoride (PREVIDENT) 1.1 % GEL dental gel Apply thin ribbon/pea-sized amount to toothbrush. Brush teeth  thoroughly, for at least 2 min. Use daily in place of conventional toothpaste. 100 mL 3   Sodium Fluoride 1.1 % PSTE Apply a thin ribbon/pea-sized amount to toothbrush. Brush teeth thoroughly, for at least 2 mintues. Use daily in place of conventional toothpaste. Expectorate Donot swallow 100 mL 3   sodium fluoride (SODIUM FLUORIDE 5000 PPM) 1.1 % GEL dental gel Apply a thin ribbon or pea-sized amount to toothbrush and brush teeth thoroughly for 2 minutes. Use daily in place of regular toothpaste 100 mL 12   thiamine (VITAMIN B-1) 100 MG tablet Take 100 mg by mouth 2 (two) times daily at 10 AM and 5 PM.      triamcinolone cream (KENALOG) 0.1 % Apply 1 Application topically 2 (two) times daily. Apply to itchy areas as needed. Taper as able 454 g 1   TURMERIC CURCUMIN PO Take by mouth.     valACYclovir (VALTREX) 1000 MG tablet Take 1 tablet (1,000 mg total) by mouth daily  For pityriasis rosea. 7 tablet 0   vitamin B-12 (CYANOCOBALAMIN) 500 MCG tablet Take 500-1,000 mcg by mouth daily.      No facility-administered medications prior to visit.    PAST MEDICAL HISTORY: Past Medical History:  Diagnosis Date   AAA (abdominal aortic aneurysm) (HCC)    ALLERGIC RHINITIS    Anemia    post op, treated /w Fe   Anxiety    Arthritis    OA, hands, - thumbs- specifically   Brachial plexopathy    double crush injury with median nerve damage on the right dominant hand.    COPD (chronic obstructive pulmonary disease) (HCC)    also states he has been told that he has slight emphysema    Dissecting aortic aneurysm, thoracic (HCC) 02/20/2012   s/p emergent surgical repair    DM2 (diabetes mellitus, type 2) (HCC)    DVT (deep venous thrombosis) (HCC)    coumadin- x3 months , post op   ERECTILE DYSFUNCTION, ORGANIC    GERD (gastroesophageal reflux disease)    related to diet & anxiety    HYPERLIPIDEMIA    Hypertension    pt. followwed by Dr. Eden Emms   MIGRAINE HEADACHE    induced from physical , emotional stress , but sometimes occur randomly, also experiences BPPV   OBSTRUCTIVE SLEEP APNEA 02/2008 sleep study   moderate & central- not able to tolerate CPAP    Thyroid nodule 04/21/2015   2.3 cm on right, incidental finding on 03/2015 CT chest - f/u US ordered   Unspecified vitamin D deficiency     PAST SURGICAL HISTORY: Past Surgical History:  Procedure Laterality Date   ABDOMINAL AORTIC ANEURYSM REPAIR N/A 11/01/2012   Procedure: ANEURYSM ABDOMINAL AORTIC REPAIR;  Surgeon: Pryor Ochoa, MD;  Location: [facility] OR;  Service: Vascular;  Laterality: N/A;  Resection and Grafting of Abdominal Aortic Aneurysm,(AORTA BI ILIAC)   bone implant oral     Nasal fx without repair     THORACIC AORTIC ANEURYSM REPAIR  02/20/2012   Procedure: THORACIC ASCENDING ANEURYSM REPAIR (AAA);  Surgeon: Kerin Perna, MD;  Location: [facility] OR;  Service: Open Heart Surgery;  Laterality: N/A;    FAMILY HISTORY: Family History  Problem Relation Age of Onset   Diabetes Mother    Stroke Mother    Dementia Mother    Deep vein thrombosis Father        PE   Varicose Veins Father    Heart disease Maternal Grandmother    Heart  disease Maternal Uncle    Heart disease Paternal Uncle    Heart disease Cousin    Lung cancer Cousin        2 cousins    SOCIAL HISTORY: Social History   Socioeconomic History   Marital status: Divorced    Spouse name: Not on file   Number of children: 2   Years of education: acad. deg.   Highest education level: Not on file  Occupational History   Occupation: internal med resident    Employer: [employer]  Tobacco Use   Smoking status: Former     Current packs/day: 0.00    Types: Cigarettes    Quit date: 02/18/2012    Years since quitting: 11.0   Smokeless tobacco: Never   Tobacco comments:    10 cigs/day, quit 02/2012 after TAA emergent repair  Vaping Use   Vaping status: Never Used  Substance and Sexual Activity   Alcohol use: Not Currently    Comment: occasional   Drug use: No   Sexual activity: Not on file  Other Topics Concern   Not on file  Social History Narrative   Patient is single and lives at home alone.    Financial controller.   Patient works at [employer] full time.   Right handed.   Caffeine one cup of coffee daily.    Social Determinants of Health   Financial Resource Strain: Low Risk  (06/22/2022)   Overall Financial Resource Strain (CARDIA)    Difficulty of Paying Living Expenses: Not hard at all  Food Insecurity: No Food Insecurity (06/22/2022)   Hunger Vital Sign    Worried About Running Out of Food in the Last Year: Never true    Ran Out of Food in the Last Year: Never true  Transportation Needs: No Transportation Needs (06/22/2022)   PRAPARE - Administrator, Civil Service (Medical): No    Lack of Transportation (Non-Medical): No  Physical Activity: Inactive (06/22/2022)   Exercise Vital Sign    Days of Exercise per Week: 0 days    Minutes of Exercise per Session: 0 min  Stress: No Stress Concern Present (06/22/2022)   [employer] of Occupational Health - Occupational Stress Questionnaire    Feeling of Stress : Not at all  Social Connections: Socially Isolated (06/22/2022)   Social Connection and Isolation Panel [NHANES]    Frequency of Communication with Friends and Family: Once a week    Frequency of Social Gatherings with Friends and Family: Once a week    Attends Religious Services: 1 to 4 times per year    Active Member of [employer] or Organizations: No    Attends Banker Meetings: Never    Marital Status: Divorced  Catering manager Violence: Not At Risk (06/22/2022)    Humiliation, Afraid, Rape, and Kick questionnaire    Fear of Current or Ex-Partner: No    Emotionally Abused: No    Physically Abused: No    Sexually Abused: No      PHYSICAL EXAM  Vitals:   03/15/23 1515  BP: 127/67  Pulse: 68  Weight: 225 lb (102.1 kg)  Height: 6' 3.75" (1.924 m)    Body mass index is 27.57 kg/m.     03/11/2022    3:41 PM 03/11/2021    2:44 PM 03/05/2020    3:50 PM 08/17/2017    3:37 PM 08/11/2016    3:46 PM  Montreal Cognitive Assessment   Visuospatial/ Executive (0/5) 5 4 5  Naming (0/3) 3 3 3     Attention: Read list of digits (0/2) 2 2 2     Attention: Read list of letters (0/1) 1 1 1     Attention: Serial 7 subtraction starting at 100 (0/3) 3 3 3     Language: Repeat phrase (0/2) 2 2 1     Language : Fluency (0/1) 1 1 1     Abstraction (0/2) 2 2 2     Delayed Recall (0/5) 5 4 4     Orientation (0/6) 6 6 6     Total 30 28 28     Adjusted Score (based on education)          Information is confidential and restricted. Go to Review Flowsheets to unlock data.      03/06/2019    2:56 PM  MMSE - Mini Mental State Exam  Orientation to time   Orientation to Place   Registration   Attention/ Calculation   Recall   Language- name 2 objects   Language- repeat   Language- follow 3 step command   Language- read & follow direction   Write a sentence   Copy design   Total score      Information is confidential and restricted. Go to Review Flowsheets to unlock data.     Generalized: Well developed, in no acute distress   Neurological examination  Mentation: Alert oriented to time, place, history taking. Follows all commands speech and language fluent Cranial nerve II-XII: Pupils were equal round reactive to light. Extraocular movements were full, visual field were full on confrontational test.  Motor: The motor testing reveals 5 over 5 strength of all 4 extremities. Good symmetric motor tone is noted throughout.  Sensory: Sensory testing is intact to  soft touch on all 4 extremities. No evidence of extinction is noted.  Coordination: Cerebellar testing reveals good finger-nose-finger and heel-to-shin bilaterally.  Gait and station: Gait is normal.    DIAGNOSTIC DATA (LABS, IMAGING, TESTING) - I reviewed patient records, labs, notes, testing and imaging myself where available.  Lab Results  Component Value Date   WBC 7.5 06/22/2022   HGB 15.7 06/22/2022   HCT 45.9 06/22/2022   MCV 92.3 06/22/2022   PLT 171.0 06/22/2022      Component Value Date/Time   NA 137 06/22/2022 1512   K 4.2 06/22/2022 1512   CL 98 06/22/2022 1512   CO2 33 (H) 06/22/2022 1512   GLUCOSE 150 (H) 06/22/2022 1512   BUN 17 06/22/2022 1512   CREATININE 0.87 06/22/2022 1512   CREATININE 0.98 04/03/2015 0957   CALCIUM 9.7 06/22/2022 1512   PROT 7.5 06/22/2022 1512   ALBUMIN 4.7 06/22/2022 1512   AST 34 06/22/2022 1512   ALT 44 06/22/2022 1512   ALKPHOS 28 (L) 06/22/2022 1512   BILITOT 1.3 (H) 06/22/2022 1512   GFRNONAA >60 06/21/2018 1143   GFRNONAA >89 06/09/2012 1515   GFRAA >60 06/21/2018 1143   GFRAA >89 06/09/2012 1515   Lab Results  Component Value Date   CHOL 117 06/22/2022   HDL 43.10 06/22/2022   LDLCALC 54 06/22/2022   LDLDIRECT 114 (H) 03/21/2010   TRIG 100.0 06/22/2022   CHOLHDL 3 06/22/2022   Lab Results  Component Value Date   HGBA1C 7.8 (H) 06/16/2021   Lab Results  Component Value Date   VITAMINB12 688 06/16/2021   Lab Results  Component Value Date   TSH 1.17 06/16/2021      ASSESSMENT AND PLAN 72 y.o. year old male  has a past  medical history of AAA (abdominal aortic aneurysm) (HCC), ALLERGIC RHINITIS, Anemia, Anxiety, Arthritis, Brachial plexopathy, COPD (chronic obstructive pulmonary disease) (HCC), Dissecting aortic aneurysm, thoracic (HCC) (02/20/2012), DM2 (diabetes mellitus, type 2) (HCC), DVT (deep venous thrombosis) (HCC), ERECTILE DYSFUNCTION, ORGANIC, GERD (gastroesophageal reflux disease), HYPERLIPIDEMIA,  Hypertension, MIGRAINE HEADACHE, OBSTRUCTIVE SLEEP APNEA (02/2008 sleep study), Thyroid nodule (04/21/2015), and Unspecified vitamin D deficiency. here with   Memory disturbance   -Reviewed findings from Dr. Marvetta Gibbons testing. -Fortunately the patient does not have an organic memory disturbance. -Have advised that if his symptoms worsen or he develops new symptoms he should let us know. -Follow-up in 1 year or sooner if needed   Butch Penny, MSN, NP-C 03/15/2023, 3:13 PM [facility] [address], [address] [place], [place] [address] [phone]   am images of all night

## 2023-03-16 ENCOUNTER — Other Ambulatory Visit (HOSPITAL_COMMUNITY): Payer: Self-pay

## 2023-03-16 ENCOUNTER — Telehealth: Payer: Self-pay | Admitting: Internal Medicine

## 2023-03-17 ENCOUNTER — Other Ambulatory Visit (HOSPITAL_COMMUNITY): Payer: Self-pay

## 2023-03-28 ENCOUNTER — Encounter: Payer: Self-pay | Admitting: Allergy & Immunology

## 2023-03-28 ENCOUNTER — Ambulatory Visit: Payer: PPO | Admitting: Allergy & Immunology

## 2023-03-28 ENCOUNTER — Other Ambulatory Visit (HOSPITAL_COMMUNITY): Payer: Self-pay

## 2023-03-28 ENCOUNTER — Other Ambulatory Visit: Payer: Self-pay

## 2023-03-28 VITALS — BP 136/84 | HR 66 | Resp 16 | Ht 73.23 in | Wt 224.9 lb

## 2023-03-28 DIAGNOSIS — J302 Other seasonal allergic rhinitis: Secondary | ICD-10-CM

## 2023-03-28 DIAGNOSIS — T782XXD Anaphylactic shock, unspecified, subsequent encounter: Secondary | ICD-10-CM | POA: Diagnosis not present

## 2023-03-28 DIAGNOSIS — J3089 Other allergic rhinitis: Secondary | ICD-10-CM

## 2023-03-28 DIAGNOSIS — T63481D Toxic effect of venom of other arthropod, accidental (unintentional), subsequent encounter: Secondary | ICD-10-CM | POA: Diagnosis not present

## 2023-03-28 MED ORDER — PREDNISONE 5 MG PO TABS
ORAL_TABLET | ORAL | 0 refills | Status: DC
Start: 2023-03-28 — End: 2024-03-27
  Filled 2023-03-28: qty 25, 5d supply, fill #0

## 2023-03-28 NOTE — Patient Instructions (Addendum)
1. Anaphylaxis - Continue to avoid alpha gal for now. - It seems that you have an excellent handle on your symptoms. - We will retest this today to see where things are headed.  - A good source for questions about medications is BikerFestival.is  2. Stinging insect allergy (honeybee, hornet) - Continue to avoid stinging insects. - EpiPen is up to date.   3. Perennial and seasonal allergic rhinitis (grasses, dust mite, cockroach) - Continue with Allegra 1-2 times daily.  - Continue with Flonase one spray per nostril up to twice daily.  - I sent in prednisone 5 mg tables to use for 2-3 days at a time.  - I think we can do skin testing at some point since this is more sensitive than the blood work. - We an consider scheduling skin testing for July 2025 when your symptoms are better.    4. COPD - Consider Xopenex for a rescue breathing medication. - This is like albuterol, but more elective for the lungs (avoiding the heart and not causing the cardiac side effects).   5. Return in about 1 year (around 03/27/2024).    Please inform us of any Emergency Department visits, hospitalizations, or changes in symptoms. Call us before going to the ED for breathing or allergy symptoms since we might be able to fit you in for a sick visit. Feel free to contact us anytime with any questions, problems, or concerns.  It was a pleasure to see you today!  Websites that have reliable patient information: 1. American Academy of Asthma, Allergy, and Immunology: www.aaaai.org 2. Food Allergy Research and Education (FARE): foodallergy.org 3. Mothers of Asthmatics: http://www.asthmacommunitynetwork.org 4. American College of Allergy, Asthma, and Immunology: www.acaai.org   COVID-19 Vaccine Information can be found at: PodExchange.nl For questions related to vaccine distribution or appointments, please email [email or web address] or call  [phone].   We realize that you might be concerned about having an allergic reaction to the COVID19 vaccines. To help with that concern, WE ARE OFFERING THE COVID19 VACCINES IN OUR OFFICE! Ask the front desk for dates!     "Like" [place] on Facebook and Instagram for our latest updates!      A healthy democracy works best when [employer] participate! Make sure you are registered to vote! If you have moved or changed any of your contact information, you will need to get this updated before voting!  In some cases, you MAY be able to register to vote online: [email or web address]     .

## 2023-03-28 NOTE — Progress Notes (Unsigned)
FOLLOW UP  Date of Service/Encounter:  03/28/23   Assessment:   Anaphylaxis - with positive alpha gal   Perennial and seasonal allergic rhinitis (grasses, dust mite, cockroach)   Insect sting allergy (honey bee, hornet)  COPD  Plan/Recommendations:   Assessment and Plan              Patient Instructions  1. Anaphylaxis - Continue to avoid alpha gal for now. - We will retest today.   2. Stinging insect allergy (honeybee, hornet) - Continue to avoid stinging insects. - EpiPen is up to date.   3. Perennial and seasonal allergic rhinitis (grasses, dust mite, cockroach) - Continue with Allegra 1-2 times daily.   4. Return in about 1 year (around 03/27/2024).    Please inform us of any Emergency Department visits, hospitalizations, or changes in symptoms. Call us before going to the ED for breathing or allergy symptoms since we might be able to fit you in for a sick visit. Feel free to contact us anytime with any questions, problems, or concerns.  It was a pleasure to meet you today!  Websites that have reliable patient information: 1. American Academy of Asthma, Allergy, and Immunology: www.aaaai.org 2. Food Allergy Research and Education (FARE): foodallergy.org 3. Mothers of Asthmatics: http://www.asthmacommunitynetwork.org 4. American College of Allergy, Asthma, and Immunology: www.acaai.org   COVID-19 Vaccine Information can be found at: [email or web address] For questions related to vaccine distribution or appointments, please email [email or web address] or call [phone].   We realize that you might be concerned about having an allergic reaction to the COVID19 vaccines. To help with that concern, WE ARE OFFERING THE COVID19 VACCINES IN OUR OFFICE! Ask the front desk for dates!     "Like" [place] on Facebook and Instagram for our latest updates!      A healthy democracy works best when [employer]  participate! Make sure you are registered to vote! If you have moved or changed any of your contact information, you will need to get this updated before voting!  In some cases, you MAY be able to register to vote online: [email or web address]     .      Subjective:   [name] is a 72 y.o. male presenting today for follow up of  Chief Complaint  Patient presents with   Allergic Reaction    Still avoiding red meats - no reactions    Breathing Problem    Shortness of breath since ragweed season started with ear popping and congestion on the right side of his head with some headaches - allegra 1/2am, 1/2 pm , zyrtec 1 tablet pm prn, Singulair, Flonase, and saline mist     [name] has a history of the following: Patient Active Problem List   Diagnosis Date Noted   Allergic reaction 04/16/2022   Amnestic MCI (mild cognitive impairment with memory loss) 03/11/2022   Attention deficit 03/11/2022   Small vessel disease, cerebrovascular 03/11/2022   Ganglion cyst of both wrists 01/04/2022   Elevated LFTs 03/20/2020   Benign prostatic hyperplasia without lower urinary tract symptoms 03/20/2020   Skin lesion of scalp 03/20/2020   Pseudodementia 03/05/2020   Cerebral microvascular disease 03/05/2020   Severe episode of recurrent major depressive disorder, without psychotic features (HCC) 03/05/2020   Coronary artery calcification seen on CAT scan 03/17/2018   Ischemic brain damage 08/17/2017   Encounter for general adult medical examination with abnormal findings 07/18/2015   Thyroid nodule 04/21/2015   AAA (abdominal aortic aneurysm) without rupture (  HCC) 04/15/2015   MCI (mild cognitive impairment) 08/26/2014   Peripheral neuropathy 03/21/2013   DM2 (diabetes mellitus, type 2) (HCC)    Brachial plexopathy    Anxiety    Occlusion and stenosis of carotid artery without mention of cerebral infarction 04/11/2012   Vitamin D deficiency  12/17/2008   Hyperlipidemia associated with type 2 diabetes mellitus (HCC) 12/17/2008   OBSTRUCTIVE SLEEP APNEA 12/17/2008   Migraine 12/17/2008   ALLERGIC RHINITIS 12/17/2008   Asthma 12/17/2008    History obtained from: chart review and {Persons; PED relatives w/patient:19415::"patient"}.  Discussed the use of AI scribe software for clinical note transcription with the patient and/or guardian, who gave verbal consent to proceed.  [name] is a 72 y.o. male presenting for {Blank single:19197::"a food challenge","a drug challenge","skin testing","a sick visit","an evaluation of ***","a follow up visit"}.  He was last seen in December 2023 as a new patient.  At that time, we obtained some labs to look for milk allergy and gone allergies.  We did talk about Xolair as an adjunct treatment for prevention of cross-contamination episodes.  We gave him several articles on alpha gal.  We obtained a stinging insect panel.  We also got blood work as well.  A stinging insect panel was notable for honeybee as well as white faced hornet.  Environmental allergy panel was positive to dust mites as well as grass and cockroach.  Discussed the use of AI scribe software for clinical note transcription with the patient, who gave verbal consent to proceed.  History of Present Illness            {Blank single:19197::"Asthma/Respiratory Symptom History: ***"," "}  {Blank single:19197::"Allergic Rhinitis Symptom History: ***"," "}  {Blank single:19197::"Food Allergy Symptom History: ***"," "}  {Blank single:19197::"Skin Symptom History: ***"," "}  {Blank single:19197::"GERD Symptom History: ***"," "}  Otherwise, there have been no changes to his past medical history, surgical history, family history, or social history.    Review of systems otherwise negative other than that mentioned in the HPI.    Objective:   There were no vitals taken for this visit. There is no height or weight on file to  calculate BMI.    Physical Exam   Diagnostic studies: {Blank single:19197::"none","deferred due to recent antihistamine use","labs sent instead"," "}  Spirometry: {Blank single:19197::"results normal (FEV1: ***%, FVC: ***%, FEV1/FVC: ***%)","results abnormal (FEV1: ***%, FVC: ***%, FEV1/FVC: ***%)"}.    {Blank single:19197::"Spirometry consistent with mild obstructive disease","Spirometry consistent with moderate obstructive disease","Spirometry consistent with severe obstructive disease","Spirometry consistent with possible restrictive disease","Spirometry consistent with mixed obstructive and restrictive disease","Spirometry uninterpretable due to technique","Spirometry consistent with normal pattern"}. {Blank single:19197::"Albuterol/Atrovent nebulizer","Xopenex/Atrovent nebulizer","Albuterol nebulizer","Albuterol four puffs via MDI","Xopenex four puffs via MDI"} treatment given in clinic with {Blank single:19197::"significant improvement in FEV1 per ATS criteria","significant improvement in FVC per ATS criteria","significant improvement in FEV1 and FVC per ATS criteria","improvement in FEV1, but not significant per ATS criteria","improvement in FVC, but not significant per ATS criteria","improvement in FEV1 and FVC, but not significant per ATS criteria","no improvement"}.  Allergy Studies: {Blank single:19197::"none","labs sent instead"," "}    {Blank single:19197::"Allergy testing results were read and interpreted by myself, documented by clinical staff."," "}      Malachi Bonds, MD  [facility]

## 2023-03-29 ENCOUNTER — Ambulatory Visit (HOSPITAL_COMMUNITY)
Admission: RE | Admit: 2023-03-29 | Payer: PPO | Source: Ambulatory Visit | Attending: Cardiovascular Disease | Admitting: Cardiovascular Disease

## 2023-03-29 ENCOUNTER — Encounter: Payer: Self-pay | Admitting: Allergy & Immunology

## 2023-03-30 ENCOUNTER — Other Ambulatory Visit (HOSPITAL_COMMUNITY): Payer: Self-pay

## 2023-03-30 ENCOUNTER — Other Ambulatory Visit: Payer: Self-pay | Admitting: Internal Medicine

## 2023-03-30 DIAGNOSIS — F411 Generalized anxiety disorder: Secondary | ICD-10-CM | POA: Diagnosis not present

## 2023-03-30 LAB — ALPHA-GAL PANEL
Allergen Lamb IgE: <0.1 kU/L
Beef IgE: 0.18 kU/L — AB
IgE (Immunoglobulin E), Serum: 19 [IU]/mL (ref 6–495)
O215-IgE Alpha-Gal: 0.33 kU/L — AB
Pork IgE: <0.1 kU/L

## 2023-03-30 MED ORDER — FLUTICASONE PROPIONATE 50 MCG/ACT NA SUSP
2.0000 | Freq: Every day | NASAL | 2 refills | Status: DC
  Filled 2023-03-30: qty 16, 30d supply, fill #0
  Filled 2023-05-19: qty 16, 30d supply, fill #1
  Filled 2023-06-21: qty 16, 30d supply, fill #2

## 2023-03-31 ENCOUNTER — Other Ambulatory Visit (HOSPITAL_COMMUNITY): Payer: Self-pay

## 2023-03-31 MED ORDER — COVID-19 MRNA VAC-TRIS(PFIZER) 30 MCG/0.3ML IM SUSY
0.3000 mL | PREFILLED_SYRINGE | Freq: Once | INTRAMUSCULAR | 0 refills | Status: AC
  Filled 2023-03-31: qty 0.3, 1d supply, fill #0

## 2023-04-05 ENCOUNTER — Ambulatory Visit (HOSPITAL_COMMUNITY)
Admission: RE | Admit: 2023-04-05 | Discharge: 2023-04-05 | Disposition: A | Discharge status: Home / Self Care | Payer: PPO | Source: Ambulatory Visit | Attending: Cardiovascular Disease | Admitting: Cardiovascular Disease

## 2023-04-05 DIAGNOSIS — R0989 Other specified symptoms and signs involving the circulatory and respiratory systems: Secondary | ICD-10-CM | POA: Diagnosis not present

## 2023-04-07 ENCOUNTER — Telehealth: Payer: Self-pay | Admitting: Cardiovascular Disease

## 2023-04-07 NOTE — Telephone Encounter (Signed)
Spoke with patient. He is aware of his [place] results. Verbalized understanding. No questions at this time

## 2023-04-07 NOTE — Telephone Encounter (Signed)
Follow Up:    Patient said to please do not call him before 12:00, he will not be able the phone.

## 2023-04-07 NOTE — Telephone Encounter (Signed)
Follow Up:    Patient is returning call, concerning his ultrasound results.

## 2023-04-11 DIAGNOSIS — M9904 Segmental and somatic dysfunction of sacral region: Secondary | ICD-10-CM | POA: Diagnosis not present

## 2023-04-11 DIAGNOSIS — M5414 Radiculopathy, thoracic region: Secondary | ICD-10-CM | POA: Diagnosis not present

## 2023-04-11 DIAGNOSIS — M5386 Other specified dorsopathies, lumbar region: Secondary | ICD-10-CM | POA: Diagnosis not present

## 2023-04-11 DIAGNOSIS — M9902 Segmental and somatic dysfunction of thoracic region: Secondary | ICD-10-CM | POA: Diagnosis not present

## 2023-04-11 DIAGNOSIS — S39012A Strain of muscle, fascia and tendon of lower back, initial encounter: Secondary | ICD-10-CM | POA: Diagnosis not present

## 2023-04-11 DIAGNOSIS — M5416 Radiculopathy, lumbar region: Secondary | ICD-10-CM | POA: Diagnosis not present

## 2023-04-11 DIAGNOSIS — M9903 Segmental and somatic dysfunction of lumbar region: Secondary | ICD-10-CM | POA: Diagnosis not present

## 2023-04-11 DIAGNOSIS — M9905 Segmental and somatic dysfunction of pelvic region: Secondary | ICD-10-CM | POA: Diagnosis not present

## 2023-04-14 DIAGNOSIS — M9905 Segmental and somatic dysfunction of pelvic region: Secondary | ICD-10-CM | POA: Diagnosis not present

## 2023-04-14 DIAGNOSIS — M5416 Radiculopathy, lumbar region: Secondary | ICD-10-CM | POA: Diagnosis not present

## 2023-04-14 DIAGNOSIS — M5414 Radiculopathy, thoracic region: Secondary | ICD-10-CM | POA: Diagnosis not present

## 2023-04-14 DIAGNOSIS — M5386 Other specified dorsopathies, lumbar region: Secondary | ICD-10-CM | POA: Diagnosis not present

## 2023-04-14 DIAGNOSIS — M9903 Segmental and somatic dysfunction of lumbar region: Secondary | ICD-10-CM | POA: Diagnosis not present

## 2023-04-14 DIAGNOSIS — M9902 Segmental and somatic dysfunction of thoracic region: Secondary | ICD-10-CM | POA: Diagnosis not present

## 2023-04-14 DIAGNOSIS — S39012A Strain of muscle, fascia and tendon of lower back, initial encounter: Secondary | ICD-10-CM | POA: Diagnosis not present

## 2023-04-14 DIAGNOSIS — M9904 Segmental and somatic dysfunction of sacral region: Secondary | ICD-10-CM | POA: Diagnosis not present

## 2023-04-18 ENCOUNTER — Other Ambulatory Visit (HOSPITAL_COMMUNITY): Payer: Self-pay

## 2023-04-18 DIAGNOSIS — M5414 Radiculopathy, thoracic region: Secondary | ICD-10-CM | POA: Diagnosis not present

## 2023-04-18 DIAGNOSIS — M9903 Segmental and somatic dysfunction of lumbar region: Secondary | ICD-10-CM | POA: Diagnosis not present

## 2023-04-18 DIAGNOSIS — M5386 Other specified dorsopathies, lumbar region: Secondary | ICD-10-CM | POA: Diagnosis not present

## 2023-04-18 DIAGNOSIS — M5416 Radiculopathy, lumbar region: Secondary | ICD-10-CM | POA: Diagnosis not present

## 2023-04-18 DIAGNOSIS — M9904 Segmental and somatic dysfunction of sacral region: Secondary | ICD-10-CM | POA: Diagnosis not present

## 2023-04-18 DIAGNOSIS — S39012A Strain of muscle, fascia and tendon of lower back, initial encounter: Secondary | ICD-10-CM | POA: Diagnosis not present

## 2023-04-18 DIAGNOSIS — M9905 Segmental and somatic dysfunction of pelvic region: Secondary | ICD-10-CM | POA: Diagnosis not present

## 2023-04-18 DIAGNOSIS — M9902 Segmental and somatic dysfunction of thoracic region: Secondary | ICD-10-CM | POA: Diagnosis not present

## 2023-04-19 ENCOUNTER — Other Ambulatory Visit (HOSPITAL_COMMUNITY): Payer: Self-pay

## 2023-04-19 MED ORDER — INFLUENZA VAC A&B SURF ANT ADJ 0.5 ML IM SUSY
0.5000 mL | PREFILLED_SYRINGE | Freq: Once | INTRAMUSCULAR | 0 refills | Status: AC
  Filled 2023-04-19: qty 0.5, 1d supply, fill #0

## 2023-04-21 DIAGNOSIS — M5416 Radiculopathy, lumbar region: Secondary | ICD-10-CM | POA: Diagnosis not present

## 2023-04-21 DIAGNOSIS — M5386 Other specified dorsopathies, lumbar region: Secondary | ICD-10-CM | POA: Diagnosis not present

## 2023-04-21 DIAGNOSIS — S39012A Strain of muscle, fascia and tendon of lower back, initial encounter: Secondary | ICD-10-CM | POA: Diagnosis not present

## 2023-04-21 DIAGNOSIS — M9903 Segmental and somatic dysfunction of lumbar region: Secondary | ICD-10-CM | POA: Diagnosis not present

## 2023-04-21 DIAGNOSIS — M5414 Radiculopathy, thoracic region: Secondary | ICD-10-CM | POA: Diagnosis not present

## 2023-04-21 DIAGNOSIS — M9904 Segmental and somatic dysfunction of sacral region: Secondary | ICD-10-CM | POA: Diagnosis not present

## 2023-04-21 DIAGNOSIS — M9905 Segmental and somatic dysfunction of pelvic region: Secondary | ICD-10-CM | POA: Diagnosis not present

## 2023-04-21 DIAGNOSIS — M9902 Segmental and somatic dysfunction of thoracic region: Secondary | ICD-10-CM | POA: Diagnosis not present

## 2023-05-17 ENCOUNTER — Other Ambulatory Visit: Payer: Self-pay | Admitting: Internal Medicine

## 2023-05-17 ENCOUNTER — Other Ambulatory Visit (HOSPITAL_COMMUNITY): Payer: Self-pay

## 2023-05-17 DIAGNOSIS — J4520 Mild intermittent asthma, uncomplicated: Secondary | ICD-10-CM

## 2023-05-17 DIAGNOSIS — I10 Essential (primary) hypertension: Secondary | ICD-10-CM

## 2023-05-19 ENCOUNTER — Other Ambulatory Visit (HOSPITAL_COMMUNITY): Payer: Self-pay

## 2023-05-19 ENCOUNTER — Other Ambulatory Visit: Payer: Self-pay | Admitting: Internal Medicine

## 2023-05-19 DIAGNOSIS — I10 Essential (primary) hypertension: Secondary | ICD-10-CM

## 2023-05-19 DIAGNOSIS — J4520 Mild intermittent asthma, uncomplicated: Secondary | ICD-10-CM

## 2023-05-19 DIAGNOSIS — E1100 Type 2 diabetes mellitus with hyperosmolarity without nonketotic hyperglycemic-hyperosmolar coma (NKHHC): Secondary | ICD-10-CM

## 2023-05-23 ENCOUNTER — Telehealth: Payer: Self-pay | Admitting: Internal Medicine

## 2023-05-23 ENCOUNTER — Other Ambulatory Visit (HOSPITAL_COMMUNITY): Payer: Self-pay

## 2023-05-23 NOTE — Telephone Encounter (Signed)
Prescription Request  05/23/2023  LOV: 06/22/2022  What is the name of the medication or equipment? montelukast (SINGULAIR) 10 MG tablet  montelukast (SINGULAIR) 10 MG tablet  hydrochlorothiazide (HYDRODIURIL) 25 MG   Have you contacted your pharmacy to request a refill? No   Which pharmacy would you like this sent to?  [facility] [address] [place] [address] Phone: [phone] Fax: [phone]    Patient notified that their request is being sent to the clinical staff for review and that they should receive a response within 2 business days.   Please advise at Mobile [phone] (mobile)

## 2023-05-24 ENCOUNTER — Other Ambulatory Visit: Payer: Self-pay

## 2023-05-24 ENCOUNTER — Telehealth: Payer: Self-pay | Admitting: Internal Medicine

## 2023-05-24 ENCOUNTER — Other Ambulatory Visit (HOSPITAL_COMMUNITY): Payer: Self-pay

## 2023-05-24 DIAGNOSIS — E1100 Type 2 diabetes mellitus with hyperosmolarity without nonketotic hyperglycemic-hyperosmolar coma (NKHHC): Secondary | ICD-10-CM

## 2023-05-24 DIAGNOSIS — J4520 Mild intermittent asthma, uncomplicated: Secondary | ICD-10-CM

## 2023-05-24 DIAGNOSIS — I10 Essential (primary) hypertension: Secondary | ICD-10-CM

## 2023-05-24 MED ORDER — METFORMIN HCL ER 500 MG PO TB24
1500.0000 mg | ORAL_TABLET | Freq: Every day | ORAL | 3 refills | Status: DC
Start: 2023-05-24 — End: 2023-06-27
  Filled 2023-05-24: qty 270, 90d supply, fill #0

## 2023-05-24 MED ORDER — HYDROCHLOROTHIAZIDE 25 MG PO TABS
25.0000 mg | ORAL_TABLET | Freq: Every day | ORAL | 1 refills | Status: DC
  Filled 2023-05-24: qty 90, 90d supply, fill #0

## 2023-05-24 MED ORDER — MONTELUKAST SODIUM 10 MG PO TABS
5.0000 mg | ORAL_TABLET | Freq: Two times a day (BID) | ORAL | 1 refills | Status: DC
  Filled 2023-05-24: qty 90, 90d supply, fill #0

## 2023-05-24 NOTE — Telephone Encounter (Signed)
Prescription Request  05/24/2023  LOV: 06/22/2022  What is the name of the medication or equipment? metFORMIN (GLUCOPHAGE-XR) 500 MG 24 hr tablet   Have you contacted your pharmacy to request a refill? No   Which pharmacy would you like this sent to?  [place] [facility] [address] [place] [address] Phone: [phone] Fax: [phone]    Patient notified that their request is being sent to the clinical staff for review and that they should receive a response within 2 business days.   Please advise at Mobile [phone] (mobile)

## 2023-05-24 NOTE — Telephone Encounter (Signed)
Error

## 2023-06-01 DIAGNOSIS — F411 Generalized anxiety disorder: Secondary | ICD-10-CM | POA: Diagnosis not present

## 2023-06-21 ENCOUNTER — Other Ambulatory Visit: Payer: Self-pay

## 2023-06-21 ENCOUNTER — Encounter (HOSPITAL_COMMUNITY): Payer: Self-pay

## 2023-06-21 ENCOUNTER — Other Ambulatory Visit: Payer: Self-pay | Admitting: Internal Medicine

## 2023-06-21 ENCOUNTER — Other Ambulatory Visit (HOSPITAL_COMMUNITY): Payer: Self-pay

## 2023-06-21 DIAGNOSIS — E041 Nontoxic single thyroid nodule: Secondary | ICD-10-CM | POA: Diagnosis not present

## 2023-06-21 DIAGNOSIS — I2510 Atherosclerotic heart disease of native coronary artery without angina pectoris: Secondary | ICD-10-CM | POA: Diagnosis not present

## 2023-06-21 DIAGNOSIS — E1142 Type 2 diabetes mellitus with diabetic polyneuropathy: Secondary | ICD-10-CM | POA: Diagnosis not present

## 2023-06-21 DIAGNOSIS — Z91018 Allergy to other foods: Secondary | ICD-10-CM | POA: Diagnosis not present

## 2023-06-21 DIAGNOSIS — Z87442 Personal history of urinary calculi: Secondary | ICD-10-CM | POA: Diagnosis not present

## 2023-06-22 ENCOUNTER — Other Ambulatory Visit: Payer: Self-pay | Admitting: Surgery

## 2023-06-22 ENCOUNTER — Ambulatory Visit: Payer: PPO

## 2023-06-22 VITALS — Ht 76.0 in | Wt 222.0 lb

## 2023-06-22 DIAGNOSIS — Z0000 Encounter for general adult medical examination without abnormal findings: Secondary | ICD-10-CM | POA: Diagnosis not present

## 2023-06-22 DIAGNOSIS — E785 Hyperlipidemia, unspecified: Secondary | ICD-10-CM | POA: Diagnosis not present

## 2023-06-22 DIAGNOSIS — E1169 Type 2 diabetes mellitus with other specified complication: Secondary | ICD-10-CM

## 2023-06-22 DIAGNOSIS — Z8679 Personal history of other diseases of the circulatory system: Secondary | ICD-10-CM

## 2023-06-22 NOTE — Progress Notes (Signed)
 CARDIOLOGY CONSULT NOTE       Patient ID: [name] MRN: [id number] DOB/AGE: March 18, 1951 73 y.o.  Primary Physician: Adelia [place], MD Primary Cardiologist: Stann Earnest    HPI:  73 y.o.  status post repair of type A ascending dissection 02/19/12 PVT  with Hemi shield graft used to replace ascending aorta and hemi-arch reconstruction, resuspension of aortic valve Likely brachial plexus injury with axillary artery cannulation on right side.  Had staged  AAA repair by Dr Timm Foot 10/2012 CT done 05/28/20 and followed by VVS/CVTS reviewed with residual false lumen 4.2 cm  Echo 02/15/20 reviewed:  EF 60-65% mild AR mild AS mean gradient 11 mmHg Cr has been normal at 1.04 07/22/20   Seen by primary 07/22/20 with ? TIA or complex migraine Had confusion and inability To use computer Accompanied by headache MRI head normal 08/17/20 Carotid 02/2019 Only had plaque no stenosis  Is [facility] to [facility] and exercising 2x/week with no chest pain / angina   Carotid 04/05/23 plaque no stenosis  Echo 02/17/23 EF 60-65% mild MR mild AS moderate AR mean gradient 10 mmHg CTA 07/14/22 stable tube graft repair ascending Ao, stable descending type 2 dissection With occluded false lumen  There does appear to be a sequestered area of thrombus/calcification Behind the non coronary cusp below the tube graft that is stable and may be related to the AV re-suspension and contributes to valve deformity and AR  Review of the operative note confirms that PVT re approximated the dissected layers of non coronary sinus with a felt strip and bioglue with re suspension of commissure and this is what is being called a "penetrating ulcer "   Has children in [place]   ROS All other systems reviewed and negative except as noted above  Past Medical History:  Diagnosis Date   AAA (abdominal aortic aneurysm) (HCC)    ALLERGIC RHINITIS    Anemia    post op, treated /w Fe   Anxiety    Arthritis    OA,  hands, - thumbs- specifically   Brachial plexopathy    double crush injury with median nerve damage on the right dominant hand.    COPD (chronic obstructive pulmonary disease) (HCC)    also states he has been told that he has slight emphysema    Dissecting aortic aneurysm, thoracic (HCC) 02/20/2012   s/p emergent surgical repair   DM2 (diabetes mellitus, type 2) (HCC)    DVT (deep venous thrombosis) (HCC)    coumadin - x3 months , post op   ERECTILE DYSFUNCTION, ORGANIC    GERD (gastroesophageal reflux disease)    related to diet & anxiety    HYPERLIPIDEMIA    Hypertension    pt. followwed by Dr. Stann Earnest   MIGRAINE HEADACHE    induced from physical , emotional stress , but sometimes occur randomly, also experiences BPPV   OBSTRUCTIVE SLEEP APNEA 02/2008 sleep study   moderate & central- not able to tolerate CPAP    Thyroid  nodule 04/21/2015   2.3 cm on right, incidental finding on 03/2015 CT chest - f/u US  ordered   Unspecified vitamin D  deficiency     Family History  Problem Relation Age of Onset   Diabetes Mother    Stroke Mother    Dementia Mother    Deep vein thrombosis Father        PE   Varicose Veins Father    Heart disease Maternal Grandmother    Heart disease Maternal Uncle  Heart disease Paternal Uncle    Heart disease Cousin    Lung cancer Cousin        2 cousins    Social History   Socioeconomic History   Marital status: Divorced    Spouse name: Not on file   Number of children: 2   Years of education: acad. deg.   Highest education level: Not on file  Occupational History   Occupation: internal med resident    Employer: [employer]   Occupation: RETIRED  Tobacco Use   Smoking status: Former    Current packs/day: 0.00    Types: Cigarettes    Quit date: 02/18/2012    Years since quitting: 11.3   Smokeless tobacco: Never   Tobacco comments:    10 cigs/day, quit 02/2012 after TAA emergent repair  Vaping Use   Vaping status: Never Used  Substance and  Sexual Activity   Alcohol use: Yes    Alcohol/week: 1.0 standard drink of alcohol    Types: 1 Shots of liquor per week    Comment: shot a week   Drug use: No   Sexual activity: Not on file  Other Topics Concern   Not on file  Social History Narrative   Patient is single and lives at home alone.    Financial controller.   Patient works at [employer] full time.   Right handed.   Caffeine one cup of coffee daily.    Social Drivers of Health   Financial Resource Strain: Medium Risk (06/22/2023)   Overall Financial Resource Strain (CARDIA)    Difficulty of Paying Living Expenses: Somewhat hard  Food Insecurity: No Food Insecurity (06/22/2023)   Hunger Vital Sign    Worried About Running Out of Food in the Last Year: Never true    Ran Out of Food in the Last Year: Never true  Transportation Needs: No Transportation Needs (06/22/2023)   PRAPARE - Administrator, Civil Service (Medical): No    Lack of Transportation (Non-Medical): No  Physical Activity: Inactive (06/22/2023)   Exercise Vital Sign    Days of Exercise per Week: 0 days    Minutes of Exercise per Session: 0 min  Stress: Stress Concern Present (06/22/2023)   [employer] of Occupational Health - Occupational Stress Questionnaire    Feeling of Stress : Rather much  Social Connections: Socially Isolated (06/22/2023)   Social Connection and Isolation Panel [NHANES]    Frequency of Communication with Friends and Family: Twice a week    Frequency of Social Gatherings with Friends and Family: Once a week    Attends Religious Services: Never    Database administrator or Organizations: No    Attends Banker Meetings: Never    Marital Status: Divorced  Catering manager Violence: Patient Unable To Answer (06/22/2023)   Humiliation, Afraid, Rape, and Kick questionnaire    Fear of Current or Ex-Partner: Patient unable to answer    Emotionally Abused: Patient unable to answer    Physically Abused: Patient unable to  answer    Sexually Abused: Patient unable to answer    Past Surgical History:  Procedure Laterality Date   ABDOMINAL AORTIC ANEURYSM REPAIR N/A 11/01/2012   Procedure: ANEURYSM ABDOMINAL AORTIC REPAIR;  Surgeon: Palma Bob, MD;  Location: [facility] OR;  Service: Vascular;  Laterality: N/A;  Resection and Grafting of Abdominal Aortic Aneurysm,(AORTA BI ILIAC)   bone implant oral     Nasal fx without repair  THORACIC AORTIC ANEURYSM REPAIR  02/20/2012   Procedure: THORACIC ASCENDING ANEURYSM REPAIR (AAA);  Surgeon: Heriberto London, MD;  Location[facility];  Service: Open Heart Surgery;  Laterality: N/A;      Current Outpatient Medications:    ALPRAZolam  (XANAX ) 1 MG tablet, Take 1/2 tablet (0.5 mg total) by mouth 4 (four) times daily., Disp: 60 tablet, Rfl: 5   aspirin  EC 81 MG tablet, Take 81 mg by mouth daily with lunch., Disp: , Rfl:    azelastine  (ASTELIN ) 0.1 % nasal spray, Place 2 sprays into both nostrils 2 (two) times daily. Office visit is due, Disp: 30 mL, Rfl: 11   calcium  carbonate (TUMS EX) 750 MG chewable tablet, Chew 1 tablet by mouth daily as needed for heartburn. , Disp: , Rfl:    Cetirizine HCl (ZYRTEC PO), Take by mouth. As needed, Disp: , Rfl:    cholecalciferol  (VITAMIN D ) 1000 UNITS tablet, Take 2,000 Units by mouth daily., Disp: , Rfl:    EPINEPHrine  0.3 mg/0.3 mL IJ SOAJ injection, INJECT 0.3 MLS INTO THE MUSCLE ONCE AS DIRECTED, Disp: 2 each, Rfl: 3   fexofenadine (ALLEGRA) 180 MG tablet, Take 90 mg by mouth 2 (two) times daily., Disp: , Rfl:    fluticasone  (FLONASE ) 50 MCG/ACT nasal spray, Place 2 sprays into both nostrils daily., Disp: 48 g, Rfl: 2   hydrochlorothiazide  (HYDRODIURIL ) 25 MG tablet, Take 1 tablet (25 mg total) by mouth daily., Disp: 90 tablet, Rfl: 3   ibuprofen  (ADVIL ) 600 MG tablet, TAKE 1 TABLET EVERY 6 HOURS AS NEEDED., Disp: 20 tablet, Rfl: 0   metFORMIN  (GLUCOPHAGE -XR) 500 MG 24 hr tablet, Take 3 tablets (1,500 mg total) by mouth daily with  breakfast., Disp: 270 tablet, Rfl: 3   metoprolol  tartrate (LOPRESSOR ) 25 MG tablet, Take 1 tablet (25 mg total) by mouth 3 (three) times daily., Disp: 270 tablet, Rfl: 3   montelukast  (SINGULAIR ) 10 MG tablet, Take 1/2 tablet (5 mg total) by mouth 2 (two) times daily., Disp: 90 tablet, Rfl: 1   Multiple Vitamin (MULTIVITAMIN WITH MINERALS) TABS, Take 1 tablet by mouth daily., Disp: , Rfl:    mupirocin  ointment (BACTROBAN ) 2 %, Apply topically 3 (three) times daily., Disp: 22 g, Rfl: 0   omega-3 acid ethyl esters (LOVAZA ) 1 g capsule, Take 1 capsule (1 g total) by mouth 2 (two) times daily., Disp: 180 capsule, Rfl: 3   OVER THE COUNTER MEDICATION, Apply 1 application topically daily as needed (for acne). Oxy 10 vanishing cream, Disp: , Rfl:    polyethylene glycol powder (GLYCOLAX /MIRALAX ) 17 GM/SCOOP powder, Take 17 g by mouth daily., Disp: 527 g, Rfl: 5   predniSONE  (DELTASONE ) 5 MG tablet, Take as directed., Disp: 25 tablet, Rfl: 0   PREVIDENT 5000 DRY MOUTH 1.1 % GEL dental gel, APPLY THIN RIBBON AND BRUSH FOR 2 MINUTES DAILY IN PLACE OF CONVENTIONAL TOOTHPASTE, Disp: , Rfl: 99   rosuvastatin  (CRESTOR ) 10 MG tablet, Take 1 tablet (10 mg total) by mouth daily., Disp: 90 tablet, Rfl: 3   sodium fluoride  (PREVIDENT) 1.1 % GEL dental gel, Apply thin ribbon/pea-sized amount to toothbrush. Brush teeth thoroughly, for at least 2 min. Use daily in place of conventional toothpaste., Disp: 100 mL, Rfl: 3   thiamine (VITAMIN B-1) 100 MG tablet, Take 100 mg by mouth 2 (two) times daily at 10 AM and 5 PM. , Disp: , Rfl:    triamcinolone  cream (KENALOG ) 0.1 %, Apply 1 Application topically 2 (two) times daily. Apply to  itchy areas as needed. Taper as able, Disp: 454 g, Rfl: 1   TURMERIC CURCUMIN PO, Take by mouth., Disp: , Rfl:    vitamin B-12 (CYANOCOBALAMIN ) 500 MCG tablet, Take 500-1,000 mcg by mouth daily. , Disp: , Rfl:    sodium fluoride  (SODIUM FLUORIDE  5000 PPM) 1.1 % GEL dental gel, Apply a thin ribbon  or pea-sized amount to toothbrush and brush teeth thoroughly for 2 minutes. Use daily in place of regular toothpaste (Patient not taking: Reported on 06/29/2023), Disp: 100 mL, Rfl: 12   Sodium Fluoride  1.1 % PSTE, Apply a thin ribbon/pea-sized amount to toothbrush. Brush teeth thoroughly, for at least 2 mintues. Use daily in place of conventional toothpaste. Expectorate Donot swallow (Patient not taking: Reported on 06/29/2023), Disp: 100 mL, Rfl: 3   Physical Exam:  140/70 mmHg by me and home readings normal  BP (!) 150/76 (BP Location: Left Arm, Patient Position: Sitting)   Pulse 63   Ht 6\' 4"  (1.93 m)   Wt 224 lb (101.6 kg)   SpO2 96%   BMI 27.27 kg/m    Affect appropriate Healthy:  appears stated age HEENT: normal Neck supple with no adenopathy JVP normal left bruits no thyromegaly Lungs clear with no wheezing and good diaphragmatic motion Heart:  S1/S2 SEM and AR murmur, no rub, gallop or click PMI normal post sternotomy  Abdomen: benighn, BS positve, no tenderness, no AAA no bruit.  No HSM or HJR post AAA repair  Distal pulses intact with no bruits No edema Neuro non-focal Skin warm and dry No muscular weakness   Labs:   Lab Results  Component Value Date   WBC 5.4 06/27/2023   HGB 15.6 06/27/2023   HCT 45.7 06/27/2023   MCV 93.8 06/27/2023   PLT 153.0 06/27/2023    Recent Labs  Lab 06/27/23 1509  NA 136  K 4.1  CL 98  CO2 31  BUN 17  CREATININE 0.81  CALCIUM  9.5  PROT 7.2  BILITOT 1.4*  ALKPHOS 31*  ALT 59*  AST 48*  GLUCOSE 158*   Lab Results  Component Value Date   TROPONINI <0.30 04/02/2012    Lab Results  Component Value Date   CHOL 122 06/27/2023   CHOL 117 06/22/2022   CHOL 131 06/16/2021   Lab Results  Component Value Date   HDL 38.40 (L) 06/27/2023   HDL 43.10 06/22/2022   HDL 38.70 (L) 06/16/2021   Lab Results  Component Value Date   LDLCALC 64 06/27/2023   LDLCALC 54 06/22/2022   LDLCALC 73 06/16/2021   Lab Results   Component Value Date   TRIG 102.0 06/27/2023   TRIG 100.0 06/22/2022   TRIG 99.0 06/16/2021   Lab Results  Component Value Date   CHOLHDL 3 06/27/2023   CHOLHDL 3 06/22/2022   CHOLHDL 3 06/16/2021   Lab Results  Component Value Date   LDLDIRECT 114 (H) 03/21/2010      Radiology: No results found.   EKG: SR rate 56 normal 06/24/18 08/14/19 SR rate 73 nonspecific ST changes    ASSESSMENT AND PLAN:    1. Type A Dissection: grafted root and resuspended valve stable by CT  07/14/22 Note misnomer by radiology regarding the repaired part of the non coronary sinus with felt/bioglue.   2. AAA:  F/U VVS false lumen stable by CT 05/28/20 4.2 cm  post endovascular repair   3. DM:  Discussed low carb diet.  Target hemoglobin A1c is 6.5 or less.  Continue  current medications.  4. HLD:  LDL at goal continue statin   5. Bruit:  Plaque on stenosis on duplex  04/05/23   6. Cognitive:  Some memory issues ? Migrainous aura f/u neuro  MRI head normal 08/17/20   7. AV:  mild AS with moderate AR. AR due to deformity of valve from prior dissection with commissural re suspension and felt/glue in non coronary sinus F/U TTE in a year   Echo in a year September 2025  F/U CVTS [place]  F/U with me in a year   Signed: Janelle Mediate 06/29/2023, 2:34 PM

## 2023-06-22 NOTE — Progress Notes (Signed)
 Subjective:   [name] is a 73 y.o. male who presents for Medicare Annual/Subsequent preventive examination.  Visit Complete: Virtual I connected with  [name] on 06/22/23 by a audio enabled telemedicine application and verified that I am speaking with the correct person using two identifiers.  Patient Location: Home  Provider Location: Office/Clinic  I discussed the limitations of evaluation and management by telemedicine. The patient expressed understanding and agreed to proceed.  Vital Signs: Because this visit was a virtual/telehealth visit, some criteria may be missing or patient reported. Any vitals not documented were not able to be obtained and vitals that have been documented are patient reported.  Patient Medicare AWV questionnaire was completed by the patient on 06/21/23; I have confirmed that all information answered by patient is correct and no changes since this date.  Cardiac Risk Factors include: advanced age (>13men, >32 women);male gender;diabetes mellitus;dyslipidemia;Other (see comment), Risk factor comments: OSA, AAA     Objective:    Today's Vitals   06/21/23 2145 06/22/23 1520  Weight:  222 lb (100.7 kg)  Height:  6' 4 (1.93 m)  PainSc: 4     Body mass index is 27.02 kg/m.     06/22/2023    3:47 PM 06/22/2022    1:45 PM 04/15/2022    4:48 AM 04/04/2020    3:03 PM 11/15/2019    8:44 PM 09/08/2019   10:53 AM 06/21/2018   11:40 AM  Advanced Directives  Does Patient Have a Medical Advance Directive? No No No No No No No  Would patient like information on creating a medical advance directive?  No - Patient declined  No - Patient declined Yes (MAU/Ambulatory/Procedural Areas - Information given) Yes (MAU/Ambulatory/Procedural Areas - Information given) No - Patient declined    Current Medications (verified) Outpatient Encounter Medications as of 06/22/2023  Medication Sig   ALPRAZolam  (XANAX ) 1 MG tablet Take 1/2 tablet (0.5 mg total) by mouth  4 (four) times daily.   aspirin  EC 81 MG tablet Take 81 mg by mouth daily with lunch.   azelastine  (ASTELIN ) 0.1 % nasal spray Place 2 sprays into both nostrils 2 (two) times daily. Office visit is due   calcium  carbonate (TUMS EX) 750 MG chewable tablet Chew 1 tablet by mouth daily as needed for heartburn.    Cetirizine HCl (ZYRTEC PO) Take by mouth. As needed   cholecalciferol  (VITAMIN D ) 1000 UNITS tablet Take 2,000 Units by mouth daily.   EPINEPHrine  0.3 mg/0.3 mL IJ SOAJ injection INJECT 0.3 MLS INTO THE MUSCLE ONCE AS DIRECTED   fexofenadine (ALLEGRA) 180 MG tablet Take 90 mg by mouth 2 (two) times daily.   fluticasone  (FLONASE ) 50 MCG/ACT nasal spray Place 2 sprays into both nostrils daily.   hydrochlorothiazide  (HYDRODIURIL ) 25 MG tablet Take 1 tablet (25 mg total) by mouth daily.   ibuprofen  (ADVIL ) 600 MG tablet TAKE 1 TABLET EVERY 6 HOURS AS NEEDED.   metFORMIN  (GLUCOPHAGE -XR) 500 MG 24 hr tablet Take 3 tablets (1,500 mg total) by mouth daily with breakfast.   metoprolol  tartrate (LOPRESSOR ) 25 MG tablet Take 1 tablet (25 mg total) by mouth 3 (three) times daily.   montelukast  (SINGULAIR ) 10 MG tablet Take 1/2 tablet (5 mg total) by mouth 2 (two) times daily.   Multiple Vitamin (MULTIVITAMIN WITH MINERALS) TABS Take 1 tablet by mouth daily.   mupirocin  ointment (BACTROBAN ) 2 % Apply topically 3 (three) times daily.   omega-3 acid ethyl esters (LOVAZA ) 1 g capsule Take  1 capsule (1 g total) by mouth 2 (two) times daily.   OVER THE COUNTER MEDICATION Apply 1 application topically daily as needed (for acne). Oxy 10 vanishing cream   polyethylene glycol powder (GLYCOLAX /MIRALAX ) powder MIX 17G WITH LIQUID AND DRINK BY MOUTH DAILY.   predniSONE  (DELTASONE ) 5 MG tablet Take as directed.   PREVIDENT 5000 DRY MOUTH 1.1 % GEL dental gel APPLY THIN RIBBON AND BRUSH FOR 2 MINUTES DAILY IN PLACE OF CONVENTIONAL TOOTHPASTE   rosuvastatin  (CRESTOR ) 10 MG tablet Take 1 tablet (10 mg total) by mouth  daily.   sodium fluoride  (PREVIDENT) 1.1 % GEL dental gel Apply thin ribbon/pea-sized amount to toothbrush. Brush teeth thoroughly, for at least 2 min. Use daily in place of conventional toothpaste.   sodium fluoride  (SODIUM FLUORIDE  5000 PPM) 1.1 % GEL dental gel Apply a thin ribbon or pea-sized amount to toothbrush and brush teeth thoroughly for 2 minutes. Use daily in place of regular toothpaste   Sodium Fluoride  1.1 % PSTE Apply a thin ribbon/pea-sized amount to toothbrush. Brush teeth thoroughly, for at least 2 mintues. Use daily in place of conventional toothpaste. Expectorate Donot swallow   thiamine (VITAMIN B-1) 100 MG tablet Take 100 mg by mouth 2 (two) times daily at 10 AM and 5 PM.    triamcinolone  cream (KENALOG ) 0.1 % Apply 1 Application topically 2 (two) times daily. Apply to itchy areas as needed. Taper as able   TURMERIC CURCUMIN PO Take by mouth.   vitamin B-12 (CYANOCOBALAMIN ) 500 MCG tablet Take 500-1,000 mcg by mouth daily.    [DISCONTINUED] oxyCODONE  (OXY IR/ROXICODONE ) 5 MG immediate release tablet Take 1 tablet (5 mg total) by mouth every 4 (four) hours as needed.   No facility-administered encounter medications on file as of 06/22/2023.    Allergies (verified) Alpha-gal, Testosterone , Codeine, Dapagliflozin , Gabapentin , Glycerin, Heparin , Other, Ultram  [tramadol ], and Albuterol    History: Past Medical History:  Diagnosis Date   AAA (abdominal aortic aneurysm) (HCC)    ALLERGIC RHINITIS    Anemia    post op, treated /w Fe   Anxiety    Arthritis    OA, hands, - thumbs- specifically   Brachial plexopathy    double crush injury with median nerve damage on the right dominant hand.    COPD (chronic obstructive pulmonary disease) (HCC)    also states he has been told that he has slight emphysema    Dissecting aortic aneurysm, thoracic (HCC) 02/20/2012   s/p emergent surgical repair   DM2 (diabetes mellitus, type 2) (HCC)    DVT (deep venous thrombosis) (HCC)     coumadin - x3 months , post op   ERECTILE DYSFUNCTION, ORGANIC    GERD (gastroesophageal reflux disease)    related to diet & anxiety    HYPERLIPIDEMIA    Hypertension    pt. followwed by Dr. Delford   MIGRAINE HEADACHE    induced from physical , emotional stress , but sometimes occur randomly, also experiences BPPV   OBSTRUCTIVE SLEEP APNEA 02/2008 sleep study   moderate & central- not able to tolerate CPAP    Thyroid  nodule 04/21/2015   2.3 cm on right, incidental finding on 03/2015 CT chest - f/u US  ordered   Unspecified vitamin D  deficiency    Past Surgical History:  Procedure Laterality Date   ABDOMINAL AORTIC ANEURYSM REPAIR N/A 11/01/2012   Procedure: ANEURYSM ABDOMINAL AORTIC REPAIR;  Surgeon: Lynwood JONETTA Collum, MD;  Location: [facility] OR;  Service: Vascular;  Laterality: N/A;  Resection and Grafting  of Abdominal Aortic Aneurysm,(AORTA BI ILIAC)   bone implant oral     Nasal fx without repair     THORACIC AORTIC ANEURYSM REPAIR  02/20/2012   Procedure: THORACIC ASCENDING ANEURYSM REPAIR (AAA);  Surgeon: Maude Fleeta Ochoa, MD;  Location: [facility] OR;  Service: Open Heart Surgery;  Laterality: N/A;   Family History  Problem Relation Age of Onset   Diabetes Mother    Stroke Mother    Dementia Mother    Deep vein thrombosis Father        PE   Varicose Veins Father    Heart disease Maternal Grandmother    Heart disease Maternal Uncle    Heart disease Paternal Uncle    Heart disease Cousin    Lung cancer Cousin        2 cousins   Social History   Socioeconomic History   Marital status: Divorced    Spouse name: Not on file   Number of children: 2   Years of education: acad. deg.   Highest education level: Not on file  Occupational History   Occupation: internal med resident    Employer: [employer]   Occupation: RETIRED  Tobacco Use   Smoking status: Former    Current packs/day: 0.00    Types: Cigarettes    Quit date: 02/18/2012    Years since quitting: 11.3   Smokeless tobacco:  Never   Tobacco comments:    10 cigs/day, quit 02/2012 after TAA emergent repair  Vaping Use   Vaping status: Never Used  Substance and Sexual Activity   Alcohol use: Yes    Alcohol/week: 1.0 standard drink of alcohol    Types: 1 Shots of liquor per week    Comment: shot a week   Drug use: No   Sexual activity: Not on file  Other Topics Concern   Not on file  Social History Narrative   Patient is single and lives at home alone.    Financial controller.   Patient works at [employer] full time.   Right handed.   Caffeine one cup of coffee daily.    Social Drivers of Corporate Investment Banker Strain: Low Risk  (06/22/2022)   Overall Financial Resource Strain (CARDIA)    Difficulty of Paying Living Expenses: Not hard at all  Food Insecurity: No Food Insecurity (06/22/2022)   Hunger Vital Sign    Worried About Running Out of Food in the Last Year: Never true    Ran Out of Food in the Last Year: Never true  Transportation Needs: No Transportation Needs (06/22/2022)   PRAPARE - Administrator, Civil Service (Medical): No    Lack of Transportation (Non-Medical): No  Physical Activity: Inactive (06/22/2022)   Exercise Vital Sign    Days of Exercise per Week: 0 days    Minutes of Exercise per Session: 0 min  Stress: No Stress Concern Present (06/22/2022)   [employer] of Occupational Health - Occupational Stress Questionnaire    Feeling of Stress : Not at all  Social Connections: Socially Isolated (06/22/2022)   Social Connection and Isolation Panel [NHANES]    Frequency of Communication with Friends and Family: Once a week    Frequency of Social Gatherings with Friends and Family: Once a week    Attends Religious Services: 1 to 4 times per year    Active Member of [employer] or Organizations: No    Attends Banker Meetings: Never    Marital Status: Divorced  Tobacco Counseling Counseling given: Not Answered Tobacco comments: 10 cigs/day, quit 02/2012 after TAA  emergent repair   Clinical Intake:  Pre-visit preparation completed: Yes  Pain : 0-10 Pain Score: 4  Pain Type: Acute pain Pain Location: Shoulder (elbows and low back pain) Effect of Pain on Daily Activities: Per pt been cutting wood     BMI - recorded: 27.02 Nutritional Status: BMI 25 -29 Overweight Nutritional Risks: None Diabetes: Yes (does not check BS) CBG done?:  (does not check BS) Did pt. bring in CBG monitor from home?: No  How often do you need to have someone help you when you read instructions, pamphlets, or other written materials from your doctor or pharmacy?: 1 - Never  Interpreter Needed?: No  Information entered by :: Maybel Dambrosio, RMA   Activities of Daily Living    06/21/2023    9:45 PM  In your present state of health, do you have any difficulty performing the following activities:  Hearing? 0  Vision? 0  Difficulty concentrating or making decisions? 1  Walking or climbing stairs? 0  Dressing or bathing? 0  Doing errands, shopping? 0  Preparing Food and eating ? N  Using the Toilet? N  In the past six months, have you accidently leaked urine? N  Do you have problems with loss of bowel control? N  Managing your Medications? N  Managing your Finances? N  Housekeeping or managing your Housekeeping? Y    Patient Care Team: Rollene Almarie LABOR, MD as PCP - General (Internal Medicine) Delford Maude BROCKS, MD as PCP - Cardiology (Cardiology) Obadiah Maude, MD (Cardiothoracic Surgery) Ruthell Lynwood NOVAK, RPH-CPP (Pharmacist) Arvell Evalene SAUNDERS, DO (Sports Medicine) Dohmeier, Dedra, MD (Neurology) Fate Morna SAILOR, [facility] (Inactive) as Pharmacist (Pharmacist)  Indicate any recent Medical Services you may have received from other than [facility] providers in the past year (date may be approximate).     Assessment:   This is a routine wellness examination for [place].  Hearing/Vision screen Hearing Screening - Comments:: Denies hearing difficulties    Vision Screening - Comments:: Denies vision issues.    Goals Addressed   None   Depression Screen    06/22/2022    2:17 PM 04/16/2022   10:56 AM 01/04/2022    3:32 PM 06/16/2021    3:41 PM 11/04/2020   11:53 AM 07/23/2020    1:46 PM 07/22/2020    3:48 PM  PHQ 2/9 Scores  PHQ - 2 Score 0 0 0 0 2 6 0  PHQ- 9 Score 0 2 0 0 12 19 0    Fall Risk    06/21/2023    9:45 PM 06/22/2022    2:17 PM 04/16/2022   10:56 AM 06/16/2021    3:41 PM 04/04/2020    3:02 PM  Fall Risk   Falls in the past year? 0 0 0 0 0  Number falls in past yr:  0 0 0   Injury with Fall?  0 0 0   Risk for fall due to :   No Fall Risks  No Fall Risks  Follow up Falls evaluation completed;Falls prevention discussed Falls evaluation completed Falls evaluation completed      MEDICARE RISK AT HOME: Medicare Risk at Home Any stairs in or around the home?: (Patient-Rptd) Yes If so, are there any without handrails?: (Patient-Rptd) No Home free of loose throw rugs in walkways, pet beds, electrical cords, etc?: (Patient-Rptd) Yes Adequate lighting in your home to reduce risk of falls?: (Patient-Rptd)  Yes Life alert?: (Patient-Rptd) Yes Use of a cane, walker or w/c?: (Patient-Rptd) No Grab bars in the bathroom?: (Patient-Rptd) No Shower chair or bench in shower?: (Patient-Rptd) Yes Elevated toilet seat or a handicapped toilet?: (Patient-Rptd) No  TIMED UP AND GO:  Was the test performed?  No    Cognitive Function:    03/06/2019    2:56 PM  MMSE - Mini Mental State Exam  Orientation to time   Orientation to Place   Registration   Attention/ Calculation   Recall   Language- name 2 objects   Language- repeat   Language- follow 3 step command   Language- read & follow direction   Write a sentence   Copy design   Total score      Information is confidential and restricted. Go to Review Flowsheets to unlock data.       03/11/2022    3:41 PM 03/11/2021    2:44 PM 03/05/2020    3:50 PM 08/17/2017    3:37 PM  08/11/2016    3:46 PM  Montreal Cognitive Assessment   Visuospatial/ Executive (0/5) 5 4 5     Naming (0/3) 3 3 3     Attention: Read list of digits (0/2) 2 2 2     Attention: Read list of letters (0/1) 1 1 1     Attention: Serial 7 subtraction starting at 100 (0/3) 3 3 3     Language: Repeat phrase (0/2) 2 2 1     Language : Fluency (0/1) 1 1 1     Abstraction (0/2) 2 2 2     Delayed Recall (0/5) 5 4 4     Orientation (0/6) 6 6 6     Total 30 28 28     Adjusted Score (based on education)          Information is confidential and restricted. Go to Review Flowsheets to unlock data.       06/22/2022    2:44 PM  6CIT Screen  What Year? 0 points  What month? 0 points  What time? 0 points  Count back from 20 0 points  Months in reverse 0 points  Repeat phrase 0 points  Total Score 0 points    Immunizations Immunization History  Administered Date(s) Administered   DTP 06/14/1976, 04/22/2003, 02/27/2006   Fluad  Quad(high Dose 65+) 03/28/2019, 03/20/2020, 03/27/2021, 04/07/2022   Fluad  Trivalent(High Dose 65+) 04/19/2023   H1N1 04/25/2008   Hepatitis A, Adult 01/28/2016, 08/18/2016   Hepatitis B 11/03/1990, 12/13/1990, 04/25/1991   Influenza Split 03/15/2017, 03/27/2019   Influenza Whole 03/22/2008, 03/28/2014   Influenza-Unspecified 04/10/2012, 03/06/2015, 03/06/2018   MMR 04/28/2006, 05/10/2006   PFIZER Comirnaty (Gray Top)Covid-19 Tri-Sucrose Vaccine 10/08/2020   PFIZER(Purple Top)SARS-COV-2 Vaccination 06/28/2019, 07/17/2019, 05/07/2020   Pfizer Covid-19 Vaccine Bivalent Booster 28yrs & up 04/15/2021   Pfizer(Comirnaty )Fall Seasonal Vaccine 12 years and older 04/07/2022, 03/31/2023   Pneumococcal Conjugate-13 03/20/2020   Pneumococcal Polysaccharide-23 02/11/2011, 03/19/2019   Td 02/27/2006   Tdap 02/25/2017   Typhoid Inactivated 01/28/2016   Varicella 06/16/1994, 04/28/2006   Zoster, Live 12/12/2013    TDAP status: Up to date  Flu Vaccine status: Up to date  Pneumococcal  vaccine status: Up to date  Covid-19 vaccine status: Completed vaccines  Qualifies for Shingles Vaccine? Yes   Zostavax completed Yes   Shingrix Completed?: No.    Education has been provided regarding the importance of this vaccine. Patient has been advised to call insurance company to determine out of pocket expense if they have not yet received this vaccine. Advised  may also receive vaccine at local pharmacy or Health Dept. Verbalized acceptance and understanding.  Screening Tests Health Maintenance  Topic Date Due   Zoster Vaccines- Shingrix (1 of 2) 05/08/2003   HEMOGLOBIN A1C  12/14/2021   OPHTHALMOLOGY EXAM  06/16/2023   Diabetic kidney evaluation - eGFR measurement  06/23/2023   Diabetic kidney evaluation - Urine ACR  06/23/2023   FOOT EXAM  06/23/2023   Medicare Annual Wellness (AWV)  06/21/2024   Fecal DNA (Cologuard)  12/05/2025   DTaP/Tdap/Td (6 - Td or Tdap) 02/26/2027   Pneumonia Vaccine 75+ Years old  Completed   INFLUENZA VACCINE  Completed   COVID-19 Vaccine  Completed   Hepatitis C Screening  Completed   HPV VACCINES  Aged Out   Colonoscopy  Discontinued    Health Maintenance  Health Maintenance Due  Topic Date Due   Zoster Vaccines- Shingrix (1 of 2) 05/08/2003   HEMOGLOBIN A1C  12/14/2021   OPHTHALMOLOGY EXAM  06/16/2023   Diabetic kidney evaluation - eGFR measurement  06/23/2023   Diabetic kidney evaluation - Urine ACR  06/23/2023    Colorectal cancer screening: Type of screening: Cologuard. Completed 12/06/2022. Repeat every 3 years  Lung Cancer Screening: (Low Dose CT Chest recommended if Age 83-80 years, 20 pack-year currently smoking OR have quit w/in 15years.) does not qualify.   Lung Cancer Screening Referral: N/A  Additional Screening:  Hepatitis C Screening: does qualify; Completed 03/20/2020  Vision Screening: Recommended annual ophthalmology exams for early detection of glaucoma and other disorders of the eye. Is the patient up to date  with their annual eye exam?  No  Who is the provider or what is the name of the office in which the patient attends annual eye exams? Dr. Octavia   If pt is not established with a provider, would they like to be referred to a provider to establish care? No .   Dental Screening: Recommended annual dental exams for proper oral hygiene  Diabetic Foot Exam: Diabetic Foot Exam: Overdue, Pt has been advised about the importance in completing this exam. Pt is scheduled for diabetic foot exam on 06/27/2023.  Community Resource Referral / Chronic Care Management: CRR required this visit?  No   CCM required this visit?  No     Plan:     I have personally reviewed and noted the following in the patient's chart:   Medical and social history Use of alcohol, tobacco or illicit drugs  Current medications and supplements including opioid prescriptions. Patient is not currently taking opioid prescriptions. Functional ability and status Nutritional status Physical activity Advanced directives List of other physicians Hospitalizations, surgeries, and ER visits in previous 12 months Vitals Screenings to include cognitive, depression, and falls Referrals and appointments  In addition, I have reviewed and discussed with patient certain preventive protocols, quality metrics, and best practice recommendations. A written personalized care plan for preventive services as well as general preventive health recommendations were provided to patient.     Tacoya Altizer L Moet Mikulski, CMA   06/22/2023   After Visit Summary: (MyChart) Due to this being a telephonic visit, the after visit summary with patients personalized plan was offered to patient via MyChart   Nurse Notes: Patient is due for A1C, eGFR and a UACR, which orders have been placed today, ready for his up coming visit.  He is also due for a foot exam.  Patient stated that he has an eye exam coming up soon with Dr. Octavia.  He is also  due for a Shingrix vaccine.   Patient had no other concerns to address today.

## 2023-06-22 NOTE — Patient Instructions (Addendum)
 [name] , Thank you for taking time to come for your Medicare Wellness Visit. I appreciate your ongoing commitment to your health goals. Please review the following plan we discussed and let me know if I can assist you in the future.   Referrals/Orders/Follow-Ups/Clinician Recommendations: You are due for a Shingles vaccine, 2 dose.  Also you are due for an A1c check, an eGFR and a UACR, (all labs) during your up coming visit with Dr. Rollene.  It was nice talking with you today.  See you soon.  Each day, aim for 6 glasses of water, plenty of protein in your diet and try to get up and walk/ stretch every hour for 5-10 minutes at a time.    This is a list of the screening recommended for you and due dates:  Health Maintenance  Topic Date Due   Yearly kidney function blood test for diabetes  06/23/2023   Yearly kidney health urinalysis for diabetes  06/23/2023   Hemoglobin A1C  06/27/2023*   Eye exam for diabetics  09/12/2023*   Zoster (Shingles) Vaccine (1 of 2) 10/12/2023*   Complete foot exam   06/23/2023   Medicare Annual Wellness Visit  06/21/2024   Cologuard (Stool DNA test)  12/05/2025   DTaP/Tdap/Td vaccine (6 - Td or Tdap) 02/26/2027   Pneumonia Vaccine  Completed   Flu Shot  Completed   COVID-19 Vaccine  Completed   Hepatitis C Screening  Completed   HPV Vaccine  Aged Out   Colon Cancer Screening  Discontinued  *Topic was postponed. The date shown is not the original due date.    Advanced directives: (Declined) Advance directive discussed with you today. Even though you declined this today, please call our office should you change your mind, and we can give you the proper paperwork for you to fill out.  Next Medicare Annual Wellness Visit scheduled for next year: Yes

## 2023-06-24 ENCOUNTER — Encounter: Payer: PPO | Admitting: Internal Medicine

## 2023-06-27 ENCOUNTER — Ambulatory Visit (INDEPENDENT_AMBULATORY_CARE_PROVIDER_SITE_OTHER): Payer: PPO | Admitting: Internal Medicine

## 2023-06-27 ENCOUNTER — Telehealth: Payer: Self-pay

## 2023-06-27 ENCOUNTER — Other Ambulatory Visit: Payer: Self-pay

## 2023-06-27 ENCOUNTER — Encounter: Payer: Self-pay | Admitting: Internal Medicine

## 2023-06-27 ENCOUNTER — Other Ambulatory Visit (HOSPITAL_COMMUNITY): Payer: Self-pay

## 2023-06-27 VITALS — BP 140/60 | HR 62 | Temp 97.9°F | Ht 76.0 in | Wt 222.0 lb

## 2023-06-27 DIAGNOSIS — F419 Anxiety disorder, unspecified: Secondary | ICD-10-CM | POA: Diagnosis not present

## 2023-06-27 DIAGNOSIS — G43B0 Ophthalmoplegic migraine, not intractable: Secondary | ICD-10-CM | POA: Diagnosis not present

## 2023-06-27 DIAGNOSIS — J4520 Mild intermittent asthma, uncomplicated: Secondary | ICD-10-CM | POA: Diagnosis not present

## 2023-06-27 DIAGNOSIS — G3184 Mild cognitive impairment, so stated: Secondary | ICD-10-CM | POA: Diagnosis not present

## 2023-06-27 DIAGNOSIS — F332 Major depressive disorder, recurrent severe without psychotic features: Secondary | ICD-10-CM | POA: Diagnosis not present

## 2023-06-27 DIAGNOSIS — I10 Essential (primary) hypertension: Secondary | ICD-10-CM

## 2023-06-27 DIAGNOSIS — I714 Abdominal aortic aneurysm, without rupture, unspecified: Secondary | ICD-10-CM

## 2023-06-27 DIAGNOSIS — E785 Hyperlipidemia, unspecified: Secondary | ICD-10-CM

## 2023-06-27 DIAGNOSIS — E1100 Type 2 diabetes mellitus with hyperosmolarity without nonketotic hyperglycemic-hyperosmolar coma (NKHHC): Secondary | ICD-10-CM | POA: Diagnosis not present

## 2023-06-27 DIAGNOSIS — E041 Nontoxic single thyroid nodule: Secondary | ICD-10-CM | POA: Diagnosis not present

## 2023-06-27 DIAGNOSIS — Z0001 Encounter for general adult medical examination with abnormal findings: Secondary | ICD-10-CM

## 2023-06-27 DIAGNOSIS — E1169 Type 2 diabetes mellitus with other specified complication: Secondary | ICD-10-CM | POA: Diagnosis not present

## 2023-06-27 DIAGNOSIS — Z0000 Encounter for general adult medical examination without abnormal findings: Secondary | ICD-10-CM | POA: Diagnosis not present

## 2023-06-27 LAB — MICROALBUMIN / CREATININE URINE RATIO
Creatinine,U: 38.5 mg/dL
Microalb Creat Ratio: 2.6 mg/g (ref 0.0–30.0)
Microalb, Ur: 1 mg/dL (ref 0.0–1.9)

## 2023-06-27 LAB — CBC
HCT: 45.7 % (ref 39.0–52.0)
Hemoglobin: 15.6 g/dL (ref 13.0–17.0)
MCHC: 34.1 g/dL (ref 30.0–36.0)
MCV: 93.8 fL (ref 78.0–100.0)
Platelets: 153 10*3/uL (ref 150.0–400.0)
RBC: 4.87 Mil/uL (ref 4.22–5.81)
RDW: 12.7 % (ref 11.5–15.5)
WBC: 5.4 10*3/uL (ref 4.0–10.5)

## 2023-06-27 LAB — TSH: TSH: 1.46 u[IU]/mL (ref 0.35–5.50)

## 2023-06-27 LAB — COMPREHENSIVE METABOLIC PANEL
ALT: 59 U/L — ABNORMAL HIGH (ref 0–53)
AST: 48 U/L — ABNORMAL HIGH (ref 0–37)
Albumin: 4.6 g/dL (ref 3.5–5.2)
Alkaline Phosphatase: 31 U/L — ABNORMAL LOW (ref 39–117)
BUN: 17 mg/dL (ref 6–23)
CO2: 31 meq/L (ref 19–32)
Calcium: 9.5 mg/dL (ref 8.4–10.5)
Chloride: 98 meq/L (ref 96–112)
Creatinine, Ser: 0.81 mg/dL (ref 0.40–1.50)
GFR: 89.56 mL/min (ref >60.00)
Glucose, Bld: 158 mg/dL — ABNORMAL HIGH (ref 70–99)
Potassium: 4.1 meq/L (ref 3.5–5.1)
Sodium: 136 meq/L (ref 135–145)
Total Bilirubin: 1.4 mg/dL — ABNORMAL HIGH (ref 0.2–1.2)
Total Protein: 7.2 g/dL (ref 6.0–8.3)

## 2023-06-27 LAB — HEMOGLOBIN A1C: Hgb A1c MFr Bld: 7.4 % — ABNORMAL HIGH (ref 4.6–6.5)

## 2023-06-27 LAB — LIPID PANEL
Cholesterol: 122 mg/dL (ref 0–200)
HDL: 38.4 mg/dL — ABNORMAL LOW (ref >39.00)
LDL Cholesterol: 64 mg/dL (ref 0–99)
NonHDL: 83.97
Total CHOL/HDL Ratio: 3
Triglycerides: 102 mg/dL (ref 0.0–149.0)
VLDL: 20.4 mg/dL (ref 0.0–40.0)

## 2023-06-27 MED ORDER — FLUTICASONE PROPIONATE 50 MCG/ACT NA SUSP
2.0000 | Freq: Every day | NASAL | 2 refills | Status: DC
  Filled 2023-06-27 – 2023-08-15 (×2): qty 48, 90d supply, fill #0
  Filled 2023-10-27 – 2023-11-09 (×2): qty 48, 90d supply, fill #1
  Filled 2024-01-25 (×2): qty 48, 90d supply, fill #2

## 2023-06-27 MED ORDER — OMEGA-3-ACID ETHYL ESTERS 1 G PO CAPS
1.0000 g | ORAL_CAPSULE | Freq: Two times a day (BID) | ORAL | 3 refills | Status: AC
  Filled 2023-06-27 – 2023-08-15 (×2): qty 180, 90d supply, fill #0
  Filled 2023-10-27: qty 180, 90d supply, fill #1
  Filled 2024-02-14: qty 180, 90d supply, fill #2
  Filled 2024-05-11: qty 180, 90d supply, fill #3

## 2023-06-27 MED ORDER — ALPRAZOLAM 1 MG PO TABS
0.5000 mg | ORAL_TABLET | Freq: Four times a day (QID) | ORAL | 5 refills | Status: DC
  Filled 2023-06-27 – 2023-07-25 (×2): qty 60, 30d supply, fill #0
  Filled 2023-08-24: qty 60, 30d supply, fill #1
  Filled 2023-09-26: qty 60, 30d supply, fill #2
  Filled 2023-10-27: qty 60, 30d supply, fill #3
  Filled 2023-11-28: qty 60, 30d supply, fill #4

## 2023-06-27 MED ORDER — POLYETHYLENE GLYCOL 3350 17 GM/SCOOP PO POWD
17.0000 g | Freq: Every day | ORAL | 5 refills | Status: AC
  Filled 2023-06-27: qty 714, 42d supply, fill #0
  Filled 2023-11-09: qty 714, 42d supply, fill #1
  Filled 2023-12-19: qty 714, 42d supply, fill #2
  Filled 2024-02-02: qty 510, 30d supply, fill #3
  Filled 2024-02-02: qty 714, 42d supply, fill #3
  Filled 2024-04-20: qty 510, 30d supply, fill #4

## 2023-06-27 MED ORDER — ROSUVASTATIN CALCIUM 10 MG PO TABS
10.0000 mg | ORAL_TABLET | Freq: Every day | ORAL | 3 refills | Status: DC
  Filled 2023-06-27: qty 90, 90d supply, fill #0
  Filled 2023-10-27: qty 90, 90d supply, fill #1
  Filled 2024-01-25 (×2): qty 90, 90d supply, fill #2
  Filled 2024-04-20: qty 90, 90d supply, fill #3

## 2023-06-27 MED ORDER — AZELASTINE HCL 0.1 % NA SOLN
2.0000 | Freq: Two times a day (BID) | NASAL | 11 refills | Status: AC
  Filled 2023-06-27: qty 30, 30d supply, fill #0
  Filled 2024-02-27: qty 30, 30d supply, fill #1
  Filled 2024-03-28: qty 30, 30d supply, fill #2
  Filled 2024-04-20: qty 30, 30d supply, fill #3

## 2023-06-27 MED ORDER — HYDROCHLOROTHIAZIDE 25 MG PO TABS
25.0000 mg | ORAL_TABLET | Freq: Every day | ORAL | 3 refills | Status: AC
  Filled 2023-06-27 – 2023-08-15 (×2): qty 90, 90d supply, fill #0
  Filled 2023-10-27: qty 90, 90d supply, fill #1
  Filled 2024-02-14: qty 90, 90d supply, fill #2
  Filled 2024-05-11: qty 90, 90d supply, fill #3

## 2023-06-27 MED ORDER — EPINEPHRINE 0.3 MG/0.3ML IJ SOAJ
INTRAMUSCULAR | 3 refills | Status: DC
  Filled 2023-06-27: qty 2, 20d supply, fill #0

## 2023-06-27 MED ORDER — MONTELUKAST SODIUM 10 MG PO TABS
5.0000 mg | ORAL_TABLET | Freq: Two times a day (BID) | ORAL | 1 refills | Status: DC
  Filled 2023-06-27 – 2023-08-15 (×2): qty 90, 90d supply, fill #0
  Filled 2023-10-27: qty 90, 90d supply, fill #1

## 2023-06-27 MED ORDER — METFORMIN HCL ER 500 MG PO TB24
1500.0000 mg | ORAL_TABLET | Freq: Every day | ORAL | 3 refills | Status: DC
  Filled 2023-06-27 – 2023-08-15 (×2): qty 270, 90d supply, fill #0
  Filled 2023-10-27: qty 270, 90d supply, fill #1
  Filled 2024-02-14: qty 270, 90d supply, fill #2
  Filled 2024-05-11: qty 270, 90d supply, fill #3

## 2023-06-27 NOTE — Progress Notes (Signed)
   Subjective:   Patient ID: [name], male    DOB: 1951-01-31, 73 y.o.   MRN: [id number]  HPI The patient is here for physical.  PMH, [facility], social history reviewed and updated  Review of Systems  Constitutional:  Positive for activity change and fatigue.  HENT: Negative.    Eyes: Negative.   Respiratory:  Positive for shortness of breath. Negative for cough and chest tightness.   Cardiovascular:  Negative for chest pain, palpitations and leg swelling.  Gastrointestinal:  Negative for abdominal distention, abdominal pain, constipation, diarrhea, nausea and vomiting.  Musculoskeletal: Negative.   Skin: Negative.   Neurological:  Positive for numbness.  Psychiatric/Behavioral: Negative.      Objective:  Physical Exam Constitutional:      Appearance: He is well-developed.  HENT:     Head: Normocephalic and atraumatic.  Cardiovascular:     Rate and Rhythm: Normal rate and regular rhythm.  Pulmonary:     Effort: Pulmonary effort is normal. No respiratory distress.     Breath sounds: Normal breath sounds. No wheezing or rales.  Abdominal:     General: Bowel sounds are normal. There is no distension.     Palpations: Abdomen is soft.     Tenderness: There is no abdominal tenderness. There is no rebound.  Musculoskeletal:     Cervical back: Normal range of motion.  Skin:    General: Skin is warm and dry.  Neurological:     Mental Status: He is alert and oriented to person, place, and time.     Sensory: Sensory deficit present.     Coordination: Coordination normal.     Comments: Foot exam done     Vitals:   06/27/23 1420 06/27/23 1431  BP: (!) 140/60 (!) 140/60  Pulse: 62   Temp: 97.9 F (36.6 C)   TempSrc: Oral   SpO2: 99%   Weight: 222 lb (100.7 kg)   Height: 6' 4 (1.93 m)     Assessment & Plan:

## 2023-06-27 NOTE — Telephone Encounter (Signed)
 Copied from CRM [id number]. Topic: Appointments - Appointment Info/Confirmation >> Jun 24, 2023  3:23 PM Rosina BIRCH wrote: Patient/patient representative is calling for information regarding an appointment. Pt called stating he received a survey for a visit for tomorrow but pt stated he has an appt on Monday and not tomorrow  06/27/23 @10 :20 am. Called pt and left voice mail to let him be aware that his appointment in today at 2:20pm and not tomorrow

## 2023-06-28 DIAGNOSIS — F411 Generalized anxiety disorder: Secondary | ICD-10-CM | POA: Diagnosis not present

## 2023-06-29 ENCOUNTER — Ambulatory Visit: Payer: PPO | Attending: Cardiovascular Disease | Admitting: Cardiovascular Disease

## 2023-06-29 VITALS — BP 150/76 | HR 63 | Ht 76.0 in | Wt 224.0 lb

## 2023-06-29 DIAGNOSIS — I351 Nonrheumatic aortic (valve) insufficiency: Secondary | ICD-10-CM | POA: Diagnosis not present

## 2023-06-29 DIAGNOSIS — I714 Abdominal aortic aneurysm, without rupture, unspecified: Secondary | ICD-10-CM

## 2023-06-29 DIAGNOSIS — I2510 Atherosclerotic heart disease of native coronary artery without angina pectoris: Secondary | ICD-10-CM | POA: Diagnosis not present

## 2023-06-29 NOTE — Assessment & Plan Note (Signed)
 Still getting some migraines but overall stable and satisfied with control.

## 2023-06-29 NOTE — Assessment & Plan Note (Addendum)
 Continues with monitoring through vascular and tight BP control to goal <130/80. BP home log with average in 120s/50-60s.

## 2023-06-29 NOTE — Assessment & Plan Note (Signed)
 Checking TSH and adjust as needed. Seeing endo for management.

## 2023-06-29 NOTE — Assessment & Plan Note (Signed)
 Checking lipid panel and adjust crestor as needed.

## 2023-06-29 NOTE — Assessment & Plan Note (Signed)
 Mood stable overall and using alprazolam  0.5 mg QID. Did try several medications in the past which he did not tolerate well.

## 2023-06-29 NOTE — Assessment & Plan Note (Signed)
 Uses alprazolam  0.5 mg QID and this is stable. He is using coping skills as well successfully.

## 2023-06-29 NOTE — Assessment & Plan Note (Signed)
 No flare today and uses allergy medication to help with symptoms.

## 2023-06-29 NOTE — Assessment & Plan Note (Signed)
Flu shot yearly. Pneumonia complete. Shingrix complete. Tetanus up to date. Colonoscopy up to date. Counseled about sun safety and mole surveillance. Counseled about the dangers of distracted driving. Given 10 year screening recommendations.

## 2023-06-29 NOTE — Assessment & Plan Note (Signed)
 Overall stable and likely related to prior episode with AAA. He is taking aspirin  daily and will continue.

## 2023-06-29 NOTE — Patient Instructions (Addendum)
 Medication Instructions:  Your physician recommends that you continue on your current medications as directed. Please refer to the Current Medication list given to you today.  *If you need a refill on your cardiac medications before your next appointment, please call your pharmacy*  Lab Work: If you have labs (blood work) drawn today and your tests are completely normal, you will receive your results only by: MyChart Message (if you have MyChart) OR A paper copy in the mail If you have any lab test that is abnormal or we need to change your treatment, we will call you to review the results.  Testing/Procedures: Your physician has requested that you have an echocardiogram in September. Echocardiography is a painless test that uses sound waves to create images of your heart. It provides your doctor with information about the size and shape of your heart and how well your heart's chambers and valves are working. This procedure takes approximately one hour. There are no restrictions for this procedure. Please do NOT wear cologne, perfume, aftershave, or lotions (deodorant is allowed). Please arrive 15 minutes prior to your appointment time.  Please note: We ask at that you not bring children with you during ultrasound (echo/ vascular) testing. Due to room size and safety concerns, children are not allowed in the ultrasound rooms during exams. Our front office staff cannot provide observation of children in our lobby area while testing is being conducted. An adult accompanying a patient to their appointment will only be allowed in the ultrasound room at the discretion of the ultrasound technician under special circumstances. We apologize for any inconvenience.  Follow-Up: At [facility], you and your health needs are our priority.  As part of our continuing mission to provide you with exceptional heart care, we have created designated Provider Care Teams.  These Care Teams include your  primary Cardiologist (physician) and Advanced Practice Providers (APPs -  Physician Assistants and Nurse Practitioners) who all work together to provide you with the care you need, when you need it.  We recommend signing up for the patient portal called "MyChart".  Sign up information is provided on this After Visit Summary.  MyChart is used to connect with patients for Virtual Visits (Telemedicine).  Patients are able to view lab/test results, encounter notes, upcoming appointments, etc.  Non-urgent messages can be sent to your provider as well.   To learn more about what you can do with MyChart, go to [email or web address].    Your next appointment:   1 year(s)  Provider:   Janelle Mediate, MD     Other Instructions

## 2023-06-29 NOTE — Assessment & Plan Note (Signed)
 Foot exam done and checking HgA1c, lipid panel, CMP, microalbumin to creatinine ratio. Adjust as needed. Is on statin and metformin  1500 mg daily.

## 2023-07-14 ENCOUNTER — Other Ambulatory Visit (HOSPITAL_COMMUNITY): Payer: Self-pay

## 2023-07-25 ENCOUNTER — Other Ambulatory Visit (HOSPITAL_COMMUNITY): Payer: Self-pay

## 2023-07-27 ENCOUNTER — Ambulatory Visit
Admission: RE | Admit: 2023-07-27 | Discharge: 2023-07-27 | Disposition: A | Discharge status: Home / Self Care | Payer: PPO | Source: Ambulatory Visit | Attending: Surgery | Admitting: Surgery

## 2023-07-27 ENCOUNTER — Other Ambulatory Visit: Payer: PPO

## 2023-07-27 DIAGNOSIS — Z8679 Personal history of other diseases of the circulatory system: Secondary | ICD-10-CM

## 2023-07-27 DIAGNOSIS — I700 Atherosclerosis of aorta: Secondary | ICD-10-CM | POA: Diagnosis not present

## 2023-07-27 DIAGNOSIS — J432 Centrilobular emphysema: Secondary | ICD-10-CM | POA: Diagnosis not present

## 2023-07-27 MED ORDER — IOPAMIDOL (ISOVUE-370) INJECTION 76%
80.0000 mL | Freq: Once | INTRAVENOUS | Status: AC | PRN
  Administered 2023-07-27: 80 mL via INTRAVENOUS

## 2023-08-03 ENCOUNTER — Encounter: Payer: Self-pay | Admitting: Surgery

## 2023-08-03 ENCOUNTER — Ambulatory Visit: Payer: PPO | Admitting: Surgery

## 2023-08-03 VITALS — BP 145/79 | HR 74 | Resp 18 | Ht 76.0 in | Wt 225.0 lb

## 2023-08-03 DIAGNOSIS — Z8679 Personal history of other diseases of the circulatory system: Secondary | ICD-10-CM

## 2023-08-03 NOTE — Progress Notes (Signed)
HPI:  The patient is a 73 year old gentleman with a history of diabetes, hypertension, hyperlipidemia who was followed by Dr. Donata Clay after undergoing emergency repair of a type A aortic dissection with resuspension of the aortic valve in 2013.  He subsequently underwent aortobifemoral bypass grafting in 2014 for an abdominal aortic aneurysm.  He is followed by Dr. Eden Emms for his cardiology care.  He has a history of moderate aortic insufficiency postoperatively and his most recent echocardiogram on 02/17/2023 showed mild aortic stenosis with a mean gradient of 10 mmHg and moderate aortic insufficiency with a pressure half-time of 606 ms.  Left ventricular ejection fraction was 60 to 65%.  Left ventricular diastolic diameter was 5.6 cm which was slightly larger than it was by echo in September 2023 when it was 5.2 cm.  He continues to feel well overall without chest pain or shortness of breath.  Current Outpatient Medications  Medication Sig Dispense Refill   ALPRAZolam (XANAX) 1 MG tablet Take 1/2 tablet (0.5 mg total) by mouth 4 (four) times daily. 60 tablet 5   aspirin EC 81 MG tablet Take 81 mg by mouth daily with lunch.     azelastine (ASTELIN) 0.1 % nasal spray Place 2 sprays into both nostrils 2 (two) times daily. Office visit is due 30 mL 11   calcium carbonate (TUMS EX) 750 MG chewable tablet Chew 1 tablet by mouth daily as needed for heartburn.      Cetirizine HCl (ZYRTEC PO) Take by mouth. As needed     cholecalciferol (VITAMIN D) 1000 UNITS tablet Take 2,000 Units by mouth daily.     EPINEPHrine 0.3 mg/0.3 mL IJ SOAJ injection INJECT 0.3 MLS INTO THE MUSCLE ONCE AS DIRECTED 2 each 3   fexofenadine (ALLEGRA) 180 MG tablet Take 90 mg by mouth 2 (two) times daily.     fluticasone (FLONASE) 50 MCG/ACT nasal spray Place 2 sprays into both nostrils daily. 48 g 2   hydrochlorothiazide (HYDRODIURIL) 25 MG tablet Take 1 tablet (25 mg total) by mouth daily. 90 tablet 3   ibuprofen (ADVIL) 600  MG tablet TAKE 1 TABLET EVERY 6 HOURS AS NEEDED. 20 tablet 0   metFORMIN (GLUCOPHAGE-XR) 500 MG 24 hr tablet Take 3 tablets (1,500 mg total) by mouth daily with breakfast. 270 tablet 3   metoprolol tartrate (LOPRESSOR) 25 MG tablet Take 1 tablet (25 mg total) by mouth 3 (three) times daily. 270 tablet 3   montelukast (SINGULAIR) 10 MG tablet Take 1/2 tablet (5 mg total) by mouth 2 (two) times daily. 90 tablet 1   Multiple Vitamin (MULTIVITAMIN WITH MINERALS) TABS Take 1 tablet by mouth daily.     mupirocin ointment (BACTROBAN) 2 % Apply topically 3 (three) times daily. 22 g 0   omega-3 acid ethyl esters (LOVAZA) 1 g capsule Take 1 capsule (1 g total) by mouth 2 (two) times daily. 180 capsule 3   OVER THE COUNTER MEDICATION Apply 1 application topically daily as needed (for acne). Oxy 10 vanishing cream     polyethylene glycol powder (GLYCOLAX/MIRALAX) 17 GM/SCOOP powder Take 17 g by mouth daily. 527 g 5   predniSONE (DELTASONE) 5 MG tablet Take as directed. 25 tablet 0   PREVIDENT 5000 DRY MOUTH 1.1 % GEL dental gel APPLY THIN RIBBON AND BRUSH FOR 2 MINUTES DAILY IN PLACE OF CONVENTIONAL TOOTHPASTE  99   rosuvastatin (CRESTOR) 10 MG tablet Take 1 tablet (10 mg total) by mouth daily. 90 tablet 3   thiamine (  VITAMIN B-1) 100 MG tablet Take 100 mg by mouth 2 (two) times daily at 10 AM and 5 PM.      triamcinolone cream (KENALOG) 0.1 % Apply 1 Application topically 2 (two) times daily. Apply to itchy areas as needed. Taper as able 454 g 1   TURMERIC CURCUMIN PO Take by mouth.     vitamin B-12 (CYANOCOBALAMIN) 500 MCG tablet Take 500-1,000 mcg by mouth daily.      No current facility-administered medications for this visit.     Physical Exam:  BP (!) 145/79 (BP Location: Left Arm)   Pulse 74   Resp 18   Ht 6\' 4"  (1.93 m)   Wt 225 lb (102.1 kg)   SpO2 95% Comment: RA  BMI 27.39 kg/m  He looks well. Cardiac exam shows a regular rate and rhythm with a 2/6 systolic flow murmur along the right  sternal border.  There is no diastolic murmur. Lungs are clear. There is no peripheral edema.  Diagnostic Tests:  Narrative & Impression  CLINICAL DATA:  Aortic dissection status post repair   EXAM: CT ANGIOGRAPHY CHEST, ABDOMEN AND PELVIS   TECHNIQUE: Non-contrast CT of the chest was initially obtained.   Multidetector CT imaging through the chest, abdomen and pelvis was performed using the standard protocol during bolus administration of intravenous contrast. Multiplanar reconstructed images and MIPs were obtained and reviewed to evaluate the vascular anatomy.   RADIATION DOSE REDUCTION: This exam was performed according to the departmental dose-optimization program which includes automated exposure control, adjustment of the mA and/or kV according to patient size and/or use of iterative reconstruction technique.   CONTRAST:  80mL ISOVUE-370 IOPAMIDOL (ISOVUE-370) INJECTION 76%   COMPARISON:  Prior CTA the chest 07/14/2022; CTA of the chest, abdomen pelvis 07/07/2021   FINDINGS: CTA CHEST FINDINGS   Cardiovascular: Stable appearance of postoperative changes of open repair of the ascending thoracic aorta without evidence of complication at the proximal or distal anastomosis. Conventional 3 vessel arch anatomy. Extensive atherosclerotic plaque. Chronic residual Stanford type B dissection in the descending thoracic aorta with mild aneurysmal dilation 2 a total of 4.5 cm. This is essentially unchanged compared to prior imaging. Atherosclerotic calcifications throughout the coronary arteries. The heart is normal in size. No pericardial effusion.   Mediastinum/Nodes: Unremarkable CT appearance of the thyroid gland. No suspicious mediastinal or hilar adenopathy. No soft tissue mediastinal mass. The thoracic esophagus is unremarkable.   Lungs/Pleura: Combined paraseptal and centrilobular pulmonary emphysema. Calcified granuloma in the right upper lobe is benign. No focal  airspace infiltrate, pleural effusion or pneumothorax. No suspicious pulmonary mass or nodule.   Musculoskeletal: No acute fracture or aggressive appearing lytic or blastic osseous lesion. Healed median sternotomy.   Review of the MIP images confirms the above findings.   CTA ABDOMEN AND PELVIS FINDINGS   VASCULAR   Aorta: Dissection flap extends through the juxtarenal aorta. Maximal aortic diameter is essentially unchanged at 4.3 cm, unchanged. Surgical changes of prior aorto bi femoral bypass without evidence of complication at the proximal or distal anastomosis.   Celiac: Patent without evidence of aneurysm, dissection, vasculitis or significant stenosis.   SMA: Patent without evidence of aneurysm, dissection, vasculitis or significant stenosis.   Renals: The right renal artery arises from the true lumen and is unremarkable. The left renal artery straddles the dissection flap in the dissection flap extends to the mid aspect of the renal artery. No flow limitation.   IMA: Occluded by prior surgical bypass.   Inflow:  Tortuous with multiple areas of mild ectasia throughout the bilateral internal iliac arteries but no aneurysm. The external iliac arteries and access vessels are widely patent.   Veins: No focal venous abnormality.   Review of the MIP images confirms the above findings.   NON-VASCULAR   Hepatobiliary: Normal hepatic contour and morphology. No discrete hepatic lesion. Multiple stones layer in the gallbladder lumen consistent with cholelithiasis.   Pancreas: Unremarkable. No pancreatic ductal dilatation or surrounding inflammatory changes.   Spleen: Normal in size without focal abnormality.   Adrenals/Urinary Tract: Normal adrenal glands. No hydronephrosis or enhancing renal mass. 3 mm nonobstructing stone in the upper pole collecting system of the left kidney. The ureters and bladder are unremarkable.   Stomach/Bowel: Colonic diverticular disease  without CT evidence of active inflammation.   Lymphatic: No suspicious lymphadenopathy.   Reproductive: Prostate is unremarkable.   Other: No abdominal wall hernia or abnormality. No abdominopelvic ascites.   Musculoskeletal: Multilevel degenerative disc disease. No acute fracture or malalignment.   Review of the MIP images confirms the above findings.   IMPRESSION: 1. Stable appearance of open operative repair of ascending thoracic aortic aneurysm an aorto bi iliac bypass graft of the abdominal aortic aneurysm with a residual chronic Stanford type B aneurysmal dissection in the descending and upper abdominal aorta. Stable mild aneurysmal dilation of the descending thoracic aorta with a maximal diameter of 4.5 cm. Stable aneurysmal dilation of the abdominal aortic aneurysm with a maximal diameter of 4.3 cm. 2. No evidence of surgical anastomotic complication. 3. Scattered atherosclerotic plaque throughout the aorta and coronary arteries. 4. Combined paraseptal and centrilobular pulmonary emphysema. 5. Multilevel degenerative disc disease. 6. Cholelithiasis. 7. Additional ancillary findings as above.     Electronically Signed   By: Malachy Moan M.D.   On: 07/27/2023 16:13      Impression:  He has a stable repair of the ascending aorta with no evidence of pseudoaneurysm formation.  There is a stable chronic descending aortic dissection extending down into the abdomen.  The descending thoracic aorta is stable at 4.5 cm and there is stable aneurysmal dilation of the abdominal aorta at 4.3 cm. His descending thoracic aneurysm is still well below the surgical threshold of 5.5 cm.  I reviewed the CT images with him and answered all of his questions.  I stressed the importance of continued good blood pressure control in preventing further enlargement and acute aortic dissection.  I advised him against doing any heavy lifting that may require a Valsalva maneuver and could suddenly  raise his blood pressure to high levels.   Plan:  I will plan to see him back in 2 years with a CTA of the chest, abdomen, and pelvis for aortic surveillance.  I spent 20 minutes performing this established patient evaluation and > 50% of this time was spent face to face counseling and coordinating the care of this patient's aortic aneurysm.    Alleen Borne, MD [facility] [phone]

## 2023-08-04 ENCOUNTER — Other Ambulatory Visit (HOSPITAL_COMMUNITY): Payer: Self-pay

## 2023-08-04 MED ORDER — CHLORHEXIDINE GLUCONATE 0.12 % MT SOLN
15.0000 mL | Freq: Two times a day (BID) | OROMUCOSAL | 2 refills | Status: AC
Start: 2023-08-04 — End: ?
  Filled 2023-08-04: qty 473, 16d supply, fill #0

## 2023-08-12 DIAGNOSIS — F411 Generalized anxiety disorder: Secondary | ICD-10-CM | POA: Diagnosis not present

## 2023-08-15 ENCOUNTER — Other Ambulatory Visit (HOSPITAL_COMMUNITY): Payer: Self-pay

## 2023-08-15 ENCOUNTER — Other Ambulatory Visit: Payer: Self-pay | Admitting: Cardiovascular Disease

## 2023-08-16 ENCOUNTER — Other Ambulatory Visit (HOSPITAL_COMMUNITY): Payer: Self-pay

## 2023-08-17 ENCOUNTER — Other Ambulatory Visit (HOSPITAL_COMMUNITY): Payer: Self-pay

## 2023-08-17 ENCOUNTER — Other Ambulatory Visit: Payer: Self-pay | Admitting: Cardiovascular Disease

## 2023-08-17 MED ORDER — METOPROLOL TARTRATE 25 MG PO TABS
25.0000 mg | ORAL_TABLET | Freq: Three times a day (TID) | ORAL | 3 refills | Status: AC
  Filled 2023-08-17: qty 270, 90d supply, fill #0
  Filled 2023-10-27: qty 270, 90d supply, fill #1
  Filled 2024-02-14: qty 270, 90d supply, fill #2
  Filled 2024-05-11: qty 270, 90d supply, fill #3

## 2023-08-24 ENCOUNTER — Other Ambulatory Visit (HOSPITAL_COMMUNITY): Payer: Self-pay

## 2023-09-06 DIAGNOSIS — F411 Generalized anxiety disorder: Secondary | ICD-10-CM | POA: Diagnosis not present

## 2023-09-26 ENCOUNTER — Other Ambulatory Visit (HOSPITAL_COMMUNITY): Payer: Self-pay

## 2023-10-06 DIAGNOSIS — F411 Generalized anxiety disorder: Secondary | ICD-10-CM | POA: Diagnosis not present

## 2023-10-10 ENCOUNTER — Ambulatory Visit: Payer: Self-pay

## 2023-10-10 NOTE — Telephone Encounter (Signed)
  Chief Complaint: general fatigue/weakness/malaise Symptoms: fatigue, malaise Frequency: intermittent for about a year Pertinent Negatives: Patient denies fever, cough, chest pain, GU s/s,  Disposition: [] ED /[] Urgent Care (no appt availability in office) / [x] Appointment(In office/virtual)/ []  [place] [facility]/ [] Home Care/ [x] Refused Recommended Disposition /[] [place] [facility]/ []  Follow-up with PCP Additional Notes: Pt states that he would like to have an appt for a check up. States that he has had malaise for about a year on/off, becoming more pronounced. Pt advised that he should see provider in the next 3 days. Pt states that he does not want to see any providers only his PCP and is willing to wait. RN stressed importance of seeing provider in the next 3 days. Pt declined. Pt states that if things get worse he will call back.  Copied from CRM [id number]. Topic: Clinical - Red Word Triage >> Oct 10, 2023  3:56 PM Freya Jesus wrote: Red Word that prompted transfer to Nurse Triage: Patient stated he's been feeling very fatigue, weakness and shortness of breath. Reason for Disposition  [1] MILD weakness (i.e., does not interfere with ability to work, go to school, normal activities) AND [2] persists > 1 week  Answer Assessment - Initial Assessment Questions 1. DESCRIPTION: "Describe how you are feeling."     General malaise 2. SEVERITY: "How bad is it?"  "Can you stand and walk?"   - MILD (0-3): Feels weak or tired, but does not interfere with work, school or normal activities.   - MODERATE (4-7): Able to stand and walk; weakness interferes with work, school, or normal activities.   - SEVERE (8-10): Unable to stand or walk; unable to do usual activities.     Pt states he can get around but could not walk a half mile without a break 3. ONSET: "When did these symptoms begin?" (e.g., hours, days, weeks, months)     Fatigue since heart surgery, worse with pollen increase. States off/on for  about a year 4. CAUSE: "What do you think is causing the weakness or fatigue?" (e.g., not drinking enough fluids, medical problem, trouble sleeping)     Unsure, may be d/t pollen 5. NEW MEDICINES:  "Have you started on any new medicines recently?" (e.g., opioid pain medicines, benzodiazepines, muscle relaxants, antidepressants, antihistamines, neuroleptics, beta blockers)     States took some abx before a dental procedure 6. OTHER SYMPTOMS: "Do you have any other symptoms?" (e.g., chest pain, fever, cough, SOB, vomiting, diarrhea, bleeding, other areas of pain)     Denies cough, fever,  Protocols used: Weakness (Generalized) and Fatigue-A-AH

## 2023-10-11 NOTE — Telephone Encounter (Signed)
 Patient is scheduled to see you

## 2023-10-20 ENCOUNTER — Ambulatory Visit: Payer: Self-pay

## 2023-10-20 NOTE — Telephone Encounter (Signed)
  Chief Complaint: SOB Symptoms: fatigue/weakness Frequency: x 1 week Pertinent Negatives: Patient denies fever, severe SOB, CP Disposition: [] ED /[] Urgent Care (no appt availability in office) / [x] Appointment(In office/virtual)/ []  [place] [facility]/ [] Home Care/ [] Refused Recommended Disposition /[] [place] [facility]/ []  Follow-up with PCP Additional Notes: Pt c/o exertional SOB that resolves with rest, fatigue and weakness x 1 week. Pt unsure if r/t pollen or if this is his new baseline. Pt reports recently seeing PCP in January and also needing F/U for repeat labs. Pt does not take any respiratory INH, and endorses possible asthma and other pulmonary dx. Pt denies any CP, severe SOB. [facility] does not appreciate audible SOB/wheezing during call. Pt is speaking in full sentences. Scheduled patient per protocol on 11/08/2023. Patient verbalized understanding and to call back with worsening symptoms.      Copied from CRM [id number]. Topic: Clinical - Red Word Triage >> Oct 20, 2023  4:16 PM Stanly Early wrote: Red Word that prompted transfer to Nurse Triage: sob patient appt was canceled due to a meeting Reason for Disposition  [1] MILD longstanding difficulty breathing AND [2]  SAME as normal  Answer Assessment - Initial Assessment Questions 1. RESPIRATORY STATUS: "Describe your breathing?" (e.g., wheezing, shortness of breath, unable to speak, severe coughing)      SOB, worsening fatigue/weakness Also had questions LFTs - and needed repeat labs drawn 2. ONSET: "When did this breathing problem begin?"      X 1 week 3. PATTERN "Does the difficult breathing come and go, or has it been constant since it started?"      Intermittent, mainly with exertion - resolves with rest 4. SEVERITY: "How bad is your breathing?" (e.g., mild, moderate, severe)    - MILD: No SOB at rest, mild SOB with walking, speaks normally in sentences, can lie down, no retractions, pulse < 100.    - MODERATE: SOB at  rest, SOB with minimal exertion and prefers to sit, cannot lie down flat, speaks in phrases, mild retractions, audible wheezing, pulse 100-120.    - SEVERE: Very SOB at rest, speaks in single words, struggling to breathe, sitting hunched forward, retractions, pulse > 120      Mild - with exertion [facility] does not appreciate audible SOB/wheezing during call. Pt is speaking in full sentences.  5. RECURRENT SYMPTOM: "Have you had difficulty breathing before?" If Yes, ask: "When was the last time?" and "What happened that time?"      "I can't remember" 6. CARDIAC HISTORY: "Do you have any history of heart disease?" (e.g., heart attack, angina, bypass surgery, angioplasty)      Hx of AAA 7. LUNG HISTORY: "Do you have any history of lung disease?"  (e.g., pulmonary embolus, asthma, emphysema)     Asthma, OSA 8. CAUSE: "What do you think is causing the breathing problem?"      Unknown vs allergies/pollen 9. OTHER SYMPTOMS: "Do you have any other symptoms? (e.g., dizziness, runny nose, cough, chest pain, fever)     Worsening chronic Fatigue/weakness  Protocols used: Breathing Difficulty-A-AH

## 2023-10-24 ENCOUNTER — Ambulatory Visit: Admitting: Internal Medicine

## 2023-10-27 ENCOUNTER — Other Ambulatory Visit (HOSPITAL_COMMUNITY): Payer: Self-pay

## 2023-10-27 NOTE — Telephone Encounter (Signed)
 Called patient and spoke with her in regards to this and patient stated that he has gotten alitte bit better and does not mind waiting to see his provider on the 27th

## 2023-11-03 DIAGNOSIS — F411 Generalized anxiety disorder: Secondary | ICD-10-CM | POA: Diagnosis not present

## 2023-11-08 ENCOUNTER — Ambulatory Visit: Admitting: Internal Medicine

## 2023-11-08 VITALS — BP 130/64 | HR 59 | Temp 97.5°F | Ht 75.0 in | Wt 226.6 lb

## 2023-11-08 DIAGNOSIS — E1169 Type 2 diabetes mellitus with other specified complication: Secondary | ICD-10-CM | POA: Diagnosis not present

## 2023-11-08 DIAGNOSIS — Z7984 Long term (current) use of oral hypoglycemic drugs: Secondary | ICD-10-CM | POA: Diagnosis not present

## 2023-11-08 DIAGNOSIS — R7989 Other specified abnormal findings of blood chemistry: Secondary | ICD-10-CM

## 2023-11-08 DIAGNOSIS — G6289 Other specified polyneuropathies: Secondary | ICD-10-CM

## 2023-11-08 DIAGNOSIS — M954 Acquired deformity of chest and rib: Secondary | ICD-10-CM

## 2023-11-08 DIAGNOSIS — J4520 Mild intermittent asthma, uncomplicated: Secondary | ICD-10-CM

## 2023-11-08 LAB — COMPREHENSIVE METABOLIC PANEL WITH GFR
ALT: 36 U/L (ref 0–53)
AST: 25 U/L (ref 0–37)
Albumin: 4.5 g/dL (ref 3.5–5.2)
Alkaline Phosphatase: 25 U/L — ABNORMAL LOW (ref 39–117)
BUN: 18 mg/dL (ref 6–23)
CO2: 26 meq/L (ref 19–32)
Calcium: 9.4 mg/dL (ref 8.4–10.5)
Chloride: 98 meq/L (ref 96–112)
Creatinine, Ser: 0.8 mg/dL (ref 0.40–1.50)
GFR: 89.67 mL/min (ref >60.00)
Glucose, Bld: 235 mg/dL — ABNORMAL HIGH (ref 70–99)
Potassium: 4.2 meq/L (ref 3.5–5.1)
Sodium: 134 meq/L — ABNORMAL LOW (ref 135–145)
Total Bilirubin: 1.5 mg/dL — ABNORMAL HIGH (ref 0.2–1.2)
Total Protein: 7.1 g/dL (ref 6.0–8.3)

## 2023-11-08 LAB — CBC
HCT: 42.8 % (ref 39.0–52.0)
Hemoglobin: 14.9 g/dL (ref 13.0–17.0)
MCHC: 34.8 g/dL (ref 30.0–36.0)
MCV: 91.3 fl (ref 78.0–100.0)
Platelets: 153 10*3/uL (ref 150.0–400.0)
RBC: 4.69 Mil/uL (ref 4.22–5.81)
RDW: 13.2 % (ref 11.5–15.5)
WBC: 8.1 10*3/uL (ref 4.0–10.5)

## 2023-11-08 LAB — FERRITIN: Ferritin: 127.6 ng/mL (ref 22.0–322.0)

## 2023-11-08 LAB — MAGNESIUM: Magnesium: 1.8 mg/dL (ref 1.5–2.5)

## 2023-11-08 LAB — VITAMIN D 25 HYDROXY (VIT D DEFICIENCY, FRACTURES): VITD: 55.13 ng/mL (ref 30.00–100.00)

## 2023-11-08 LAB — VITAMIN B12: Vitamin B-12: 414 pg/mL (ref 211–911)

## 2023-11-08 NOTE — Progress Notes (Unsigned)
   Subjective:   Patient ID: [name], male    DOB: 1950-10-30, 73 y.o.   MRN: [id number]  HPI The patient is a 73 YO man coming in for low back pain and SOB. Low back pain started yesterday was feeding logs into a splitter. Also has a knot on sternum noticed about 10 days ago. SOB is stable overall and present for some time. Worse during allergy season and uses rare prednisone  to help. Uses allergy medicine all the time.   Review of Systems  Constitutional:  Positive for activity change and fatigue.  HENT: Negative.    Eyes: Negative.   Respiratory:  Positive for shortness of breath. Negative for cough and chest tightness.   Cardiovascular:  Negative for chest pain, palpitations and leg swelling.  Gastrointestinal:  Negative for abdominal distention, abdominal pain, constipation, diarrhea, nausea and vomiting.  Musculoskeletal:  Positive for back pain.       Decreased grip strength.   Skin: Negative.   Neurological: Negative.   Psychiatric/Behavioral: Negative.      Objective:  Physical Exam Constitutional:      Appearance: He is well-developed.  HENT:     Head: Normocephalic and atraumatic.  Cardiovascular:     Rate and Rhythm: Normal rate and regular rhythm.     Comments: Bony knot at the upper left border of the sternum with the clavicle. Non-tender. Pulmonary:     Effort: Pulmonary effort is normal. No respiratory distress.     Breath sounds: Normal breath sounds. No wheezing or rales.  Abdominal:     General: Bowel sounds are normal. There is no distension.     Palpations: Abdomen is soft.     Tenderness: There is no abdominal tenderness. There is no rebound.  Musculoskeletal:        General: Tenderness present.     Cervical back: Normal range of motion.  Skin:    General: Skin is warm and dry.  Neurological:     Mental Status: He is alert and oriented to person, place, and time.     Coordination: Coordination normal.     Vitals:   11/08/23 1509  BP:  130/64  Pulse: (!) 59  Temp: (!) 97.5 F (36.4 C)  TempSrc: Oral  SpO2: 98%  Weight: 226 lb 9.6 oz (102.8 kg)  Height: 6\' 3"  (1.905 m)    Assessment & Plan:

## 2023-11-08 NOTE — Patient Instructions (Signed)
 We will check the labs today.

## 2023-11-09 ENCOUNTER — Ambulatory Visit: Payer: Self-pay | Admitting: Internal Medicine

## 2023-11-09 ENCOUNTER — Other Ambulatory Visit (HOSPITAL_COMMUNITY): Payer: Self-pay

## 2023-11-09 ENCOUNTER — Encounter: Payer: Self-pay | Admitting: Internal Medicine

## 2023-11-09 ENCOUNTER — Other Ambulatory Visit

## 2023-11-09 DIAGNOSIS — M954 Acquired deformity of chest and rib: Secondary | ICD-10-CM | POA: Insufficient documentation

## 2023-11-09 DIAGNOSIS — M5416 Radiculopathy, lumbar region: Secondary | ICD-10-CM | POA: Diagnosis not present

## 2023-11-09 DIAGNOSIS — M9902 Segmental and somatic dysfunction of thoracic region: Secondary | ICD-10-CM | POA: Diagnosis not present

## 2023-11-09 DIAGNOSIS — M9905 Segmental and somatic dysfunction of pelvic region: Secondary | ICD-10-CM | POA: Diagnosis not present

## 2023-11-09 DIAGNOSIS — M5414 Radiculopathy, thoracic region: Secondary | ICD-10-CM | POA: Diagnosis not present

## 2023-11-09 DIAGNOSIS — M9903 Segmental and somatic dysfunction of lumbar region: Secondary | ICD-10-CM | POA: Diagnosis not present

## 2023-11-09 DIAGNOSIS — M5136 Other intervertebral disc degeneration, lumbar region with discogenic back pain only: Secondary | ICD-10-CM | POA: Diagnosis not present

## 2023-11-09 NOTE — Assessment & Plan Note (Signed)
 Suspect arthritis bony lesion non tender on exam. Will monitor clinically and US  the area if needed.

## 2023-11-09 NOTE — Assessment & Plan Note (Signed)
 Checking b12 given new fatigue.

## 2023-11-09 NOTE — Assessment & Plan Note (Signed)
 Checking B12, ferritin, vitamin D , CBC, CMP and magnesium . There is some component of muscle cramping as well as numbness/tingling.

## 2023-11-09 NOTE — Assessment & Plan Note (Signed)
Checking CMP for follow up.

## 2023-11-10 ENCOUNTER — Telehealth: Payer: Self-pay | Admitting: Internal Medicine

## 2023-11-10 ENCOUNTER — Other Ambulatory Visit (HOSPITAL_COMMUNITY): Payer: Self-pay

## 2023-11-10 NOTE — Telephone Encounter (Unsigned)
 Copied from CRM [id number]. Topic: Clinical - Medication Question >> Nov 09, 2023  3:40 PM Peg Bouton F wrote: Reason for CRM: Pt saw Dr. Nicolette Barrio yesterday for low back pain and was offered a muscle relaxer and he declined it and he was told to call back if he changed his mind. Pt went to the chiropractor and he is still in pain and wishes to take her up on the muscle relaxer option now.  He wants a tablet not a gel tablet if possible please.  This is his preferred pharmacy  [place] [facility] [address] [place] [address] Phone: [phone] Fax: [phone] Hours: M-F 7:30am-6pm  Pt states he is going to the pharmacy soon and would like to see if there is any way we can get this done for him today or tomorrow please.  Thank you so much

## 2023-11-11 ENCOUNTER — Other Ambulatory Visit (HOSPITAL_COMMUNITY): Payer: Self-pay

## 2023-11-11 DIAGNOSIS — M5136 Other intervertebral disc degeneration, lumbar region with discogenic back pain only: Secondary | ICD-10-CM | POA: Diagnosis not present

## 2023-11-11 DIAGNOSIS — M9903 Segmental and somatic dysfunction of lumbar region: Secondary | ICD-10-CM | POA: Diagnosis not present

## 2023-11-11 DIAGNOSIS — M9902 Segmental and somatic dysfunction of thoracic region: Secondary | ICD-10-CM | POA: Diagnosis not present

## 2023-11-11 DIAGNOSIS — M5414 Radiculopathy, thoracic region: Secondary | ICD-10-CM | POA: Diagnosis not present

## 2023-11-11 DIAGNOSIS — M9905 Segmental and somatic dysfunction of pelvic region: Secondary | ICD-10-CM | POA: Diagnosis not present

## 2023-11-11 DIAGNOSIS — M5416 Radiculopathy, lumbar region: Secondary | ICD-10-CM | POA: Diagnosis not present

## 2023-11-11 MED ORDER — TIZANIDINE HCL 2 MG PO TABS
2.0000 mg | ORAL_TABLET | Freq: Four times a day (QID) | ORAL | 0 refills | Status: AC | PRN
  Filled 2023-11-11: qty 30, 8d supply, fill #0

## 2023-11-11 NOTE — Assessment & Plan Note (Signed)
 Having more SOB during allergy season and uses rare prednisone  pills. He is also using allergy treatment. Has albuterol  he can use prn

## 2023-11-11 NOTE — Telephone Encounter (Signed)
 Sent in

## 2023-11-11 NOTE — Telephone Encounter (Signed)
 Pharmacy is correct

## 2023-11-14 DIAGNOSIS — M5416 Radiculopathy, lumbar region: Secondary | ICD-10-CM | POA: Diagnosis not present

## 2023-11-14 DIAGNOSIS — M9902 Segmental and somatic dysfunction of thoracic region: Secondary | ICD-10-CM | POA: Diagnosis not present

## 2023-11-14 DIAGNOSIS — M9903 Segmental and somatic dysfunction of lumbar region: Secondary | ICD-10-CM | POA: Diagnosis not present

## 2023-11-14 DIAGNOSIS — M5414 Radiculopathy, thoracic region: Secondary | ICD-10-CM | POA: Diagnosis not present

## 2023-11-14 DIAGNOSIS — M9905 Segmental and somatic dysfunction of pelvic region: Secondary | ICD-10-CM | POA: Diagnosis not present

## 2023-11-14 DIAGNOSIS — M5136 Other intervertebral disc degeneration, lumbar region with discogenic back pain only: Secondary | ICD-10-CM | POA: Diagnosis not present

## 2023-11-18 DIAGNOSIS — M9903 Segmental and somatic dysfunction of lumbar region: Secondary | ICD-10-CM | POA: Diagnosis not present

## 2023-11-18 DIAGNOSIS — M5416 Radiculopathy, lumbar region: Secondary | ICD-10-CM | POA: Diagnosis not present

## 2023-11-18 DIAGNOSIS — M5414 Radiculopathy, thoracic region: Secondary | ICD-10-CM | POA: Diagnosis not present

## 2023-11-18 DIAGNOSIS — M5136 Other intervertebral disc degeneration, lumbar region with discogenic back pain only: Secondary | ICD-10-CM | POA: Diagnosis not present

## 2023-11-18 DIAGNOSIS — M9905 Segmental and somatic dysfunction of pelvic region: Secondary | ICD-10-CM | POA: Diagnosis not present

## 2023-11-18 DIAGNOSIS — M9902 Segmental and somatic dysfunction of thoracic region: Secondary | ICD-10-CM | POA: Diagnosis not present

## 2023-11-28 ENCOUNTER — Other Ambulatory Visit (HOSPITAL_COMMUNITY): Payer: Self-pay

## 2023-11-28 ENCOUNTER — Other Ambulatory Visit: Payer: Self-pay | Admitting: Internal Medicine

## 2023-11-28 DIAGNOSIS — J4520 Mild intermittent asthma, uncomplicated: Secondary | ICD-10-CM

## 2023-11-30 ENCOUNTER — Other Ambulatory Visit (HOSPITAL_COMMUNITY): Payer: Self-pay

## 2023-11-30 MED ORDER — MONTELUKAST SODIUM 10 MG PO TABS
5.0000 mg | ORAL_TABLET | Freq: Two times a day (BID) | ORAL | 1 refills | Status: AC
Start: 2023-11-30 — End: ?
  Filled 2023-11-30 – 2024-02-14 (×2): qty 90, 90d supply, fill #0
  Filled 2024-05-11: qty 90, 90d supply, fill #1

## 2023-12-01 DIAGNOSIS — F411 Generalized anxiety disorder: Secondary | ICD-10-CM | POA: Diagnosis not present

## 2023-12-19 ENCOUNTER — Other Ambulatory Visit (HOSPITAL_COMMUNITY): Payer: Self-pay

## 2023-12-20 DIAGNOSIS — I2510 Atherosclerotic heart disease of native coronary artery without angina pectoris: Secondary | ICD-10-CM | POA: Diagnosis not present

## 2023-12-20 DIAGNOSIS — Z87442 Personal history of urinary calculi: Secondary | ICD-10-CM | POA: Diagnosis not present

## 2023-12-20 DIAGNOSIS — E1142 Type 2 diabetes mellitus with diabetic polyneuropathy: Secondary | ICD-10-CM | POA: Diagnosis not present

## 2023-12-20 DIAGNOSIS — M89319 Hypertrophy of bone, unspecified shoulder: Secondary | ICD-10-CM | POA: Diagnosis not present

## 2023-12-20 DIAGNOSIS — E041 Nontoxic single thyroid nodule: Secondary | ICD-10-CM | POA: Diagnosis not present

## 2023-12-21 ENCOUNTER — Other Ambulatory Visit (HOSPITAL_COMMUNITY): Payer: Self-pay

## 2023-12-21 ENCOUNTER — Other Ambulatory Visit: Payer: Self-pay | Admitting: Internal Medicine

## 2023-12-21 DIAGNOSIS — E041 Nontoxic single thyroid nodule: Secondary | ICD-10-CM

## 2023-12-23 ENCOUNTER — Ambulatory Visit
Admission: RE | Admit: 2023-12-23 | Discharge: 2023-12-23 | Disposition: A | Discharge status: Home / Self Care | Source: Ambulatory Visit | Attending: Internal Medicine | Admitting: Internal Medicine

## 2023-12-23 ENCOUNTER — Other Ambulatory Visit: Payer: Self-pay | Admitting: Internal Medicine

## 2023-12-23 DIAGNOSIS — M89319 Hypertrophy of bone, unspecified shoulder: Secondary | ICD-10-CM

## 2023-12-23 DIAGNOSIS — E041 Nontoxic single thyroid nodule: Secondary | ICD-10-CM | POA: Diagnosis not present

## 2023-12-23 DIAGNOSIS — R222 Localized swelling, mass and lump, trunk: Secondary | ICD-10-CM | POA: Diagnosis not present

## 2023-12-28 DIAGNOSIS — F411 Generalized anxiety disorder: Secondary | ICD-10-CM | POA: Diagnosis not present

## 2023-12-29 ENCOUNTER — Other Ambulatory Visit (HOSPITAL_COMMUNITY): Payer: Self-pay

## 2023-12-29 ENCOUNTER — Other Ambulatory Visit: Payer: Self-pay | Admitting: Internal Medicine

## 2023-12-30 ENCOUNTER — Other Ambulatory Visit (HOSPITAL_COMMUNITY): Payer: Self-pay

## 2023-12-30 ENCOUNTER — Other Ambulatory Visit: Payer: Self-pay | Admitting: Internal Medicine

## 2023-12-31 ENCOUNTER — Other Ambulatory Visit (HOSPITAL_COMMUNITY): Payer: Self-pay

## 2023-12-31 MED ORDER — ALPRAZOLAM 1 MG PO TABS
0.5000 mg | ORAL_TABLET | Freq: Four times a day (QID) | ORAL | 5 refills | Status: DC
  Filled 2023-12-31: qty 60, 30d supply, fill #0
  Filled 2024-02-02: qty 60, 30d supply, fill #1
  Filled 2024-03-05: qty 60, 30d supply, fill #2
  Filled 2024-04-04: qty 60, 30d supply, fill #3
  Filled 2024-05-07: qty 60, 30d supply, fill #4
  Filled 2024-06-06: qty 60, 30d supply, fill #5

## 2024-01-23 DIAGNOSIS — F411 Generalized anxiety disorder: Secondary | ICD-10-CM | POA: Diagnosis not present

## 2024-01-25 ENCOUNTER — Other Ambulatory Visit (HOSPITAL_COMMUNITY): Payer: Self-pay

## 2024-01-26 ENCOUNTER — Other Ambulatory Visit (HOSPITAL_COMMUNITY): Payer: Self-pay

## 2024-02-01 ENCOUNTER — Other Ambulatory Visit: Payer: Self-pay

## 2024-02-02 ENCOUNTER — Other Ambulatory Visit (HOSPITAL_COMMUNITY): Payer: Self-pay

## 2024-02-07 DIAGNOSIS — H10413 Chronic giant papillary conjunctivitis, bilateral: Secondary | ICD-10-CM | POA: Diagnosis not present

## 2024-02-07 DIAGNOSIS — H43811 Vitreous degeneration, right eye: Secondary | ICD-10-CM | POA: Diagnosis not present

## 2024-02-07 DIAGNOSIS — H0102B Squamous blepharitis left eye, upper and lower eyelids: Secondary | ICD-10-CM | POA: Diagnosis not present

## 2024-02-07 DIAGNOSIS — H401131 Primary open-angle glaucoma, bilateral, mild stage: Secondary | ICD-10-CM | POA: Diagnosis not present

## 2024-02-07 DIAGNOSIS — H0102A Squamous blepharitis right eye, upper and lower eyelids: Secondary | ICD-10-CM | POA: Diagnosis not present

## 2024-02-07 DIAGNOSIS — H04123 Dry eye syndrome of bilateral lacrimal glands: Secondary | ICD-10-CM | POA: Diagnosis not present

## 2024-02-07 LAB — HM DIABETES EYE EXAM

## 2024-02-09 DIAGNOSIS — M5136 Other intervertebral disc degeneration, lumbar region with discogenic back pain only: Secondary | ICD-10-CM | POA: Diagnosis not present

## 2024-02-09 DIAGNOSIS — M9903 Segmental and somatic dysfunction of lumbar region: Secondary | ICD-10-CM | POA: Diagnosis not present

## 2024-02-09 DIAGNOSIS — M5416 Radiculopathy, lumbar region: Secondary | ICD-10-CM | POA: Diagnosis not present

## 2024-02-09 DIAGNOSIS — M9905 Segmental and somatic dysfunction of pelvic region: Secondary | ICD-10-CM | POA: Diagnosis not present

## 2024-02-09 DIAGNOSIS — M9902 Segmental and somatic dysfunction of thoracic region: Secondary | ICD-10-CM | POA: Diagnosis not present

## 2024-02-09 DIAGNOSIS — M5414 Radiculopathy, thoracic region: Secondary | ICD-10-CM | POA: Diagnosis not present

## 2024-02-14 ENCOUNTER — Other Ambulatory Visit (HOSPITAL_COMMUNITY): Payer: Self-pay

## 2024-02-16 ENCOUNTER — Other Ambulatory Visit (HOSPITAL_COMMUNITY): Payer: PPO

## 2024-02-16 ENCOUNTER — Other Ambulatory Visit (HOSPITAL_COMMUNITY): Payer: Self-pay

## 2024-02-16 ENCOUNTER — Ambulatory Visit (HOSPITAL_COMMUNITY): Payer: PPO

## 2024-02-20 DIAGNOSIS — F411 Generalized anxiety disorder: Secondary | ICD-10-CM | POA: Diagnosis not present

## 2024-02-24 ENCOUNTER — Other Ambulatory Visit (HOSPITAL_COMMUNITY): Payer: Self-pay

## 2024-02-27 ENCOUNTER — Other Ambulatory Visit (HOSPITAL_COMMUNITY): Payer: Self-pay

## 2024-02-27 ENCOUNTER — Other Ambulatory Visit: Payer: Self-pay | Admitting: Allergy & Immunology

## 2024-02-28 ENCOUNTER — Other Ambulatory Visit (HOSPITAL_COMMUNITY): Payer: Self-pay

## 2024-02-28 ENCOUNTER — Encounter (HOSPITAL_COMMUNITY): Payer: Self-pay

## 2024-03-02 ENCOUNTER — Other Ambulatory Visit (HOSPITAL_COMMUNITY): Payer: Self-pay

## 2024-03-05 ENCOUNTER — Other Ambulatory Visit (HOSPITAL_COMMUNITY): Payer: Self-pay

## 2024-03-09 ENCOUNTER — Other Ambulatory Visit: Payer: Self-pay | Admitting: Internal Medicine

## 2024-03-09 ENCOUNTER — Other Ambulatory Visit (HOSPITAL_COMMUNITY): Payer: Self-pay

## 2024-03-09 MED ORDER — FLUTICASONE PROPIONATE 50 MCG/ACT NA SUSP
2.0000 | Freq: Every day | NASAL | 2 refills | Status: AC
  Filled 2024-03-09 – 2024-05-07 (×2): qty 48, 90d supply, fill #0

## 2024-03-14 ENCOUNTER — Other Ambulatory Visit (HOSPITAL_COMMUNITY): Payer: Self-pay

## 2024-03-14 ENCOUNTER — Ambulatory Visit (HOSPITAL_COMMUNITY)
Admission: RE | Admit: 2024-03-14 | Discharge: 2024-03-14 | Disposition: A | Discharge status: Home / Self Care | Source: Ambulatory Visit | Attending: Cardiovascular Disease | Admitting: Cardiovascular Disease

## 2024-03-14 DIAGNOSIS — I351 Nonrheumatic aortic (valve) insufficiency: Secondary | ICD-10-CM | POA: Insufficient documentation

## 2024-03-14 LAB — ECHOCARDIOGRAM COMPLETE
AR max vel: 2.66 cm2
AV Area VTI: 2.76 cm2
AV Area mean vel: 2.56 cm2
AV Mean grad: 13 mmHg
AV Peak grad: 23.3 mmHg
Ao pk vel: 2.42 m/s
Area-P 1/2: 2.04 cm2
MV M vel: 5.6 m/s
MV Peak grad: 125.4 mmHg
P 1/2 time: 506 ms
Radius: 0.7 cm
S' Lateral: 3.95 cm

## 2024-03-15 ENCOUNTER — Ambulatory Visit: Payer: PPO | Admitting: Neurology

## 2024-03-15 ENCOUNTER — Telehealth: Payer: Self-pay | Admitting: Neurology

## 2024-03-15 ENCOUNTER — Ambulatory Visit: Payer: Self-pay | Admitting: Cardiovascular Disease

## 2024-03-15 NOTE — Telephone Encounter (Signed)
 Pt was sent wrong information about appt time for visit 03/15/2024. Showed me proof he was told to be here an hour later than appt was scheduled. Will be calling back to reschedule.

## 2024-03-16 ENCOUNTER — Other Ambulatory Visit (HOSPITAL_BASED_OUTPATIENT_CLINIC_OR_DEPARTMENT_OTHER): Payer: Self-pay

## 2024-03-16 MED ORDER — COMIRNATY 30 MCG/0.3ML IM SUSY
0.3000 mL | PREFILLED_SYRINGE | Freq: Once | INTRAMUSCULAR | 0 refills | Status: AC
  Filled 2024-03-16: qty 0.3, 1d supply, fill #0

## 2024-03-16 NOTE — Progress Notes (Signed)
 Patient viewed in mychart     Last read by [name] at 7:40PM on 03/15/2024.

## 2024-03-18 ENCOUNTER — Encounter: Payer: Self-pay | Admitting: Neurology

## 2024-03-19 ENCOUNTER — Other Ambulatory Visit (HOSPITAL_BASED_OUTPATIENT_CLINIC_OR_DEPARTMENT_OTHER): Payer: Self-pay

## 2024-03-19 DIAGNOSIS — F411 Generalized anxiety disorder: Secondary | ICD-10-CM | POA: Diagnosis not present

## 2024-03-19 MED ORDER — FLUZONE HIGH-DOSE 0.5 ML IM SUSY
0.5000 mL | PREFILLED_SYRINGE | Freq: Once | INTRAMUSCULAR | 0 refills | Status: AC
  Filled 2024-03-19: qty 0.5, 1d supply, fill #0

## 2024-03-27 ENCOUNTER — Other Ambulatory Visit (HOSPITAL_COMMUNITY): Payer: Self-pay

## 2024-03-27 ENCOUNTER — Ambulatory Visit: Payer: PPO | Admitting: Allergy & Immunology

## 2024-03-27 ENCOUNTER — Encounter: Payer: Self-pay | Admitting: Allergy & Immunology

## 2024-03-27 ENCOUNTER — Other Ambulatory Visit: Payer: Self-pay

## 2024-03-27 VITALS — BP 132/78 | HR 68 | Temp 98.4°F | Resp 16 | Ht 76.0 in | Wt 227.2 lb

## 2024-03-27 DIAGNOSIS — J302 Other seasonal allergic rhinitis: Secondary | ICD-10-CM | POA: Diagnosis not present

## 2024-03-27 DIAGNOSIS — J3089 Other allergic rhinitis: Secondary | ICD-10-CM

## 2024-03-27 DIAGNOSIS — T63481D Toxic effect of venom of other arthropod, accidental (unintentional), subsequent encounter: Secondary | ICD-10-CM | POA: Diagnosis not present

## 2024-03-27 DIAGNOSIS — T782XXD Anaphylactic shock, unspecified, subsequent encounter: Secondary | ICD-10-CM

## 2024-03-27 MED ORDER — PREDNISONE 5 MG PO TABS
ORAL_TABLET | ORAL | 0 refills | Status: AC
  Filled 2024-03-27: qty 20, 20d supply, fill #0

## 2024-03-27 MED ORDER — EPINEPHRINE 0.3 MG/0.3ML IJ SOAJ
0.3000 mg | Freq: Once | INTRAMUSCULAR | 3 refills | Status: DC
  Filled 2024-03-27: qty 2, 30d supply, fill #0

## 2024-03-27 MED ORDER — EPINEPHRINE 0.3 MG/0.3ML IJ SOAJ
0.3000 mg | Freq: Once | INTRAMUSCULAR | 3 refills | Status: AC
  Filled 2024-03-27: qty 2, 15d supply, fill #0
  Filled 2024-07-18: qty 2, 15d supply, fill #1

## 2024-03-27 NOTE — Progress Notes (Signed)
 FOLLOW UP  Date of Service/Encounter:  03/27/24   Assessment:   Anaphylaxis - with positive alpha gal   Perennial and seasonal allergic rhinitis (grasses, dust mite, cockroach)   Insect sting allergy (honey bee, hornet)   COPD  Plan/Recommendations:   1. Anaphylaxis - Continue to avoid alpha gal for now. - Repeat testing provided today.  - A good source for questions about medications is [email or web address]  2. Stinging insect allergy (honeybee, hornet) - Continue to avoid stinging insects. - EpiPen  is up to date.  - Refill sent in today.  3. Perennial and seasonal allergic rhinitis (grasses, dust mite, cockroach) - Continue with Allegra 1-2 times daily.  - Continue with Flonase  one spray per nostril up to twice daily.  - Continue with the prednisone  tablets for a few days at a time as needed (20 more tablets sent in today).  - Strongly consider some skin testing in the future (maybe in the middle of the winter when things are more dead outside).  - Then we could start allergy shots at to prevent needing prednisone  again in the future.  - Even small doses of prednisone  can cause a lot of not great side effects.    4. COPD - Consider Xopenex  for a rescue breathing medication. - This is like albuterol , but more elective for the lungs (avoiding the heart and not causing the cardiac side effects).   5. Return in about 8 weeks (around 05/22/2024) for SKIN TESTING. You can have the follow up appointment with Dr. Iva or a Nurse Practicioner (our Nurse Practitioners are excellent and always have Physician oversight!).   Subjective:   [name] is a 73 y.o. male presenting today for follow up of  Chief Complaint  Patient presents with   Follow-up    He has SOB and congestion by ragweed     [name] has a history of the following: Patient Active Problem List   Diagnosis Date Noted   Deformity of sternum 11/09/2023   Allergic reaction  04/16/2022   Amnestic MCI (mild cognitive impairment with memory loss) 03/11/2022   Attention deficit 03/11/2022   Small vessel disease, cerebrovascular 03/11/2022   Ganglion cyst of both wrists 01/04/2022   Elevated LFTs 03/20/2020   Benign prostatic hyperplasia without lower urinary tract symptoms 03/20/2020   Skin lesion of scalp 03/20/2020   Pseudodementia 03/05/2020   Cerebral microvascular disease 03/05/2020   Severe episode of recurrent major depressive disorder, without psychotic features (HCC) 03/05/2020   Coronary artery calcification seen on CAT scan 03/17/2018   Ischemic brain damage 08/17/2017   Low back pain 08/24/2016   Encounter for general adult medical examination with abnormal findings 07/18/2015   Thyroid  nodule 04/21/2015   AAA (abdominal aortic aneurysm) without rupture 04/15/2015   MCI (mild cognitive impairment) 08/26/2014   Peripheral neuropathy 03/21/2013   DM2 (diabetes mellitus, type 2) (HCC)    Brachial plexopathy    Anxiety    Occlusion and stenosis of carotid artery without mention of cerebral infarction 04/11/2012   Vitamin D  deficiency 12/17/2008   Hyperlipidemia associated with type 2 diabetes mellitus (HCC) 12/17/2008   OBSTRUCTIVE SLEEP APNEA 12/17/2008   Migraine 12/17/2008   ALLERGIC RHINITIS 12/17/2008   Asthma 12/17/2008    History obtained from: chart review and patient.  Discussed the use of AI scribe software for clinical note transcription with the patient and/or guardian, who gave verbal consent to proceed.  [name] is a 73 y.o. male presenting for a follow  up visit. He was last seen in October 2024. At that time, we recommended continued with avoidance of alpha gal. We also continued with her EpiPen . For her rhinitis, we continued with Allegra as well as Flonase  and prednisone  5mg  daily to use as needed. For her COPD, we continued with Xopenex  for rescue.  Since the last visit, he has done very well. Per usual, he comes with a list of  notes and questions.   Asthma/Respiratory Symptom History: He remains on the albuterol  as needed. Hitoshi's asthma has been well controlled. He has not required rescue medication, experienced nocturnal awakenings due to lower respiratory symptoms, nor have activities of daily living been limited. He has required no Emergency Department or Urgent Care visits for his asthma. He has required zero courses of systemic steroids for asthma exacerbations since the last visit. ACT score today is 25, indicating excellent asthma symptom control.   Allergic Rhinitis Symptom History: Since mid-August, he has experienced significant allergy symptoms, particularly when ragweed counts are high. Symptoms worsen when counts exceed nine, leading to increased shortness of breath and head congestion. He uses prednisone  as an emergency backup, taking a quarter of a 5 mg tablet, sometimes twice a day, which disrupts his sleep. Pseudoephedrine provides some relief but causes him to feel 'wound up'. No significant nasal discharge despite regularly blowing his nose. He has a history of sleep apnea, but reports that he does not think he currently has it.   Food Allergy Symptom History: He has a history of alpha-gal allergy and is cautious about his diet, avoiding red meat and checking for cross-contamination in processed foods. He takes vitamin D3 without issues, despite concerns about its lanolin source.  He experiences persistent fatigue, which he reports began before his aortic repair surgery and worsened afterward. He has lived with fatigue since before the surgery, which worsened afterward. He has had two sleep studies in the past, with one indicating poor results and the other showing no significant issues. He was unable to tolerate CPAP therapy due to discomfort and noise.  He recently experienced a rash diagnosed as pityriasis, treated with a topical cream and an antibiotic. The rash spread across his abdomen and back and is  likely viral and self-limiting.  He is currently taking vitamin D3 twice daily at 100 mg and has an EpiPen  for emergencies, which he keeps due to his allergy history. He is concerned about the stability of the EpiPen  in varying temperatures and is seeking solutions to insulate it effectively.  Otherwise, there have been no changes to his past medical history, surgical history, family history, or social history.    Review of systems otherwise negative other than that mentioned in the HPI.    Objective:   Blood pressure 132/78, pulse 68, temperature 98.4 F (36.9 C), temperature source Temporal, resp. rate 16, height 6' 4 (1.93 m), weight 227 lb 3.2 oz (103.1 kg), SpO2 96%. Body mass index is 27.66 kg/m.    Physical Exam Vitals reviewed.  Constitutional:      Appearance: He is well-developed.     Comments: Friendly. Talkative.   HENT:     Head: Normocephalic and atraumatic.     Right Ear: Tympanic membrane, ear canal and external ear normal. No drainage, swelling or tenderness. Tympanic membrane is not injected, scarred, erythematous, retracted or bulging.     Left Ear: Tympanic membrane, ear canal and external ear normal. No drainage, swelling or tenderness. Tympanic membrane is not injected, scarred, erythematous, retracted or  bulging.     Nose: No nasal deformity, septal deviation, mucosal edema or rhinorrhea.     Right Turbinates: Enlarged, swollen and pale.     Left Turbinates: Enlarged, swollen and pale.     Right Sinus: No maxillary sinus tenderness or frontal sinus tenderness.     Left Sinus: No maxillary sinus tenderness or frontal sinus tenderness.     Comments: No polyps noted.     Mouth/Throat:     Lips: Pink.     Mouth: Mucous membranes are moist. Mucous membranes are not pale and not dry.     Pharynx: Uvula midline.  Eyes:     General:        Right eye: No discharge.        Left eye: No discharge.     Conjunctiva/sclera: Conjunctivae normal.     Right eye:  Right conjunctiva is not injected. No chemosis.    Left eye: Left conjunctiva is not injected. No chemosis.    Pupils: Pupils are equal, round, and reactive to light.  Cardiovascular:     Rate and Rhythm: Normal rate and regular rhythm.     Heart sounds: Normal heart sounds.  Pulmonary:     Effort: Pulmonary effort is normal. No tachypnea, accessory muscle usage or respiratory distress.     Breath sounds: Normal breath sounds. No decreased breath sounds, wheezing, rhonchi or rales.     Comments: No wheezing or crackles noted.  Chest:     Chest wall: No tenderness.  Abdominal:     Tenderness: There is no abdominal tenderness. There is no guarding or rebound.  Lymphadenopathy:     Head:     Right side of head: No submandibular, tonsillar or occipital adenopathy.     Left side of head: No submandibular, tonsillar or occipital adenopathy.     Cervical: No cervical adenopathy.  Skin:    General: Skin is warm.     Capillary Refill: Capillary refill takes less than 2 seconds.     Coloration: Skin is not pale.     Findings: No abrasion, erythema, petechiae or rash. Rash is not papular, urticarial or vesicular.     Comments: No skin findings today.  Neurological:     Mental Status: He is alert.  Psychiatric:        Behavior: Behavior is cooperative.      Diagnostic studies: labs sent instead      Marty Shaggy, MD  [facility] 

## 2024-03-27 NOTE — Patient Instructions (Addendum)
 1. Anaphylaxis - Continue to avoid alpha gal for now. - Repeat testing provided today.  - A good source for questions about medications is [email or web address]  2. Stinging insect allergy (honeybee, hornet) - Continue to avoid stinging insects. - EpiPen  is up to date.  - Refill sent in today.  3. Perennial and seasonal allergic rhinitis (grasses, dust mite, cockroach) - Continue with Allegra 1-2 times daily.  - Continue with Flonase  one spray per nostril up to twice daily.  - Continue with the prednisone  tablets for a few days at a time as needed (20 more tablets sent in today).  - Strongly consider some skin testing in the future (maybe in the middle of the winter when things are more dead outside).  - Then we could start allergy shots at to prevent needing prednisone  again in the future.  - Even small doses of prednisone  can cause a lot of not great side effects.    4. COPD - Consider Xopenex  for a rescue breathing medication. - This is like albuterol , but more elective for the lungs (avoiding the heart and not causing the cardiac side effects).   5. Return in about 8 weeks (around 05/22/2024) for SKIN TESTING. You can have the follow up appointment with Dr. Iva or a Nurse Practicioner (our Nurse Practitioners are excellent and always have Physician oversight!).    Please inform us  of any Emergency Department visits, hospitalizations, or changes in symptoms. Call us  before going to the ED for breathing or allergy symptoms since we might be able to fit you in for a sick visit. Feel free to contact us  anytime with any questions, problems, or concerns.  It was a pleasure to see you again today!  Websites that have reliable patient information: 1. American Academy of Asthma, Allergy, and Immunology: www.aaaai.org 2. Food Allergy Research and Education (FARE): foodallergy.org 3. Mothers of Asthmatics: http://www.asthmacommunitynetwork.org 4. American College of Allergy,  Asthma, and Immunology: www.acaai.org      "Like" us  on Facebook and Instagram for our latest updates!      A healthy democracy works best when [employer] participate! Make sure you are registered to vote! If you have moved or changed any of your contact information, you will need to get this updated before voting! Scan the QR codes below to learn more!

## 2024-03-28 ENCOUNTER — Other Ambulatory Visit (HOSPITAL_COMMUNITY): Payer: Self-pay

## 2024-03-30 ENCOUNTER — Encounter: Payer: Self-pay | Admitting: Allergy & Immunology

## 2024-03-30 LAB — ALPHA-GAL PANEL
Allergen Lamb IgE: <0.1 kU/L
Beef IgE: <0.1 kU/L
IgE (Immunoglobulin E), Serum: 28 [IU]/mL (ref 6–495)
O215-IgE Alpha-Gal: 0.13 kU/L — AB
Pork IgE: <0.1 kU/L

## 2024-04-04 ENCOUNTER — Other Ambulatory Visit (HOSPITAL_COMMUNITY): Payer: Self-pay

## 2024-04-05 DIAGNOSIS — D485 Neoplasm of uncertain behavior of skin: Secondary | ICD-10-CM | POA: Diagnosis not present

## 2024-04-05 DIAGNOSIS — L308 Other specified dermatitis: Secondary | ICD-10-CM | POA: Diagnosis not present

## 2024-04-05 DIAGNOSIS — L309 Dermatitis, unspecified: Secondary | ICD-10-CM | POA: Diagnosis not present

## 2024-04-05 DIAGNOSIS — L821 Other seborrheic keratosis: Secondary | ICD-10-CM | POA: Diagnosis not present

## 2024-04-05 DIAGNOSIS — L820 Inflamed seborrheic keratosis: Secondary | ICD-10-CM | POA: Diagnosis not present

## 2024-04-09 ENCOUNTER — Other Ambulatory Visit (HOSPITAL_BASED_OUTPATIENT_CLINIC_OR_DEPARTMENT_OTHER): Payer: Self-pay

## 2024-04-12 ENCOUNTER — Other Ambulatory Visit: Payer: Self-pay

## 2024-04-16 ENCOUNTER — Other Ambulatory Visit (HOSPITAL_COMMUNITY): Payer: Self-pay

## 2024-04-16 DIAGNOSIS — F411 Generalized anxiety disorder: Secondary | ICD-10-CM | POA: Diagnosis not present

## 2024-04-16 MED ORDER — TRIAMCINOLONE ACETONIDE 0.1 % EX CREA
1.0000 | TOPICAL_CREAM | Freq: Two times a day (BID) | CUTANEOUS | 3 refills | Status: AC
  Filled 2024-04-16: qty 454, 90d supply, fill #0

## 2024-04-20 ENCOUNTER — Other Ambulatory Visit (HOSPITAL_COMMUNITY): Payer: Self-pay

## 2024-04-23 ENCOUNTER — Other Ambulatory Visit (HOSPITAL_BASED_OUTPATIENT_CLINIC_OR_DEPARTMENT_OTHER): Payer: Self-pay

## 2024-05-07 ENCOUNTER — Other Ambulatory Visit (HOSPITAL_COMMUNITY): Payer: Self-pay

## 2024-05-11 ENCOUNTER — Other Ambulatory Visit (HOSPITAL_COMMUNITY): Payer: Self-pay

## 2024-05-14 DIAGNOSIS — F411 Generalized anxiety disorder: Secondary | ICD-10-CM | POA: Diagnosis not present

## 2024-05-15 DIAGNOSIS — M5414 Radiculopathy, thoracic region: Secondary | ICD-10-CM | POA: Diagnosis not present

## 2024-05-15 DIAGNOSIS — M9903 Segmental and somatic dysfunction of lumbar region: Secondary | ICD-10-CM | POA: Diagnosis not present

## 2024-05-15 DIAGNOSIS — M9902 Segmental and somatic dysfunction of thoracic region: Secondary | ICD-10-CM | POA: Diagnosis not present

## 2024-05-15 DIAGNOSIS — M9905 Segmental and somatic dysfunction of pelvic region: Secondary | ICD-10-CM | POA: Diagnosis not present

## 2024-05-15 DIAGNOSIS — M5417 Radiculopathy, lumbosacral region: Secondary | ICD-10-CM | POA: Diagnosis not present

## 2024-05-15 DIAGNOSIS — M5386 Other specified dorsopathies, lumbar region: Secondary | ICD-10-CM | POA: Diagnosis not present

## 2024-05-22 ENCOUNTER — Ambulatory Visit: Admitting: Allergy & Immunology

## 2024-05-22 ENCOUNTER — Encounter: Payer: Self-pay | Admitting: Allergy & Immunology

## 2024-05-22 DIAGNOSIS — J302 Other seasonal allergic rhinitis: Secondary | ICD-10-CM | POA: Diagnosis not present

## 2024-05-22 DIAGNOSIS — T782XXD Anaphylactic shock, unspecified, subsequent encounter: Secondary | ICD-10-CM | POA: Diagnosis not present

## 2024-05-22 DIAGNOSIS — J3089 Other allergic rhinitis: Secondary | ICD-10-CM | POA: Diagnosis not present

## 2024-05-22 NOTE — Patient Instructions (Addendum)
 1. Anaphylaxis - Continue to avoid alpha gal for now. - Repeat testing provided today.  - A good source for questions about medications is bikerfestival.is - Testing was positive to almond, pork, lamb, carrots, and corn. - These could be false positives (except for the red meats, of course). - Notice what happens next time that you these items.   2. Stinging insect allergy (honeybee, hornet) - Continue to avoid stinging insects. - EpiPen  is up to date.  - Refill sent in today.  3. Perennial and seasonal allergic rhinitis (grasses, ragweed, dust mite, dog, cockroach) - Testing today was positive to RAGWEED and DOG. - Copy of testing results provided. - Previous lab work was positive to grasses, dust mites, cockroach.  - Continue with Allegra 1-2 times daily.  - Continue with Flonase  one spray per nostril up to twice daily.  - Consider allergy shots for long-term control.    4. COPD - Consider Xopenex  for a rescue breathing medication. - This is like albuterol , but more elective for the lungs (avoiding the heart and not causing the cardiac side effects).   5. Return in about 1 year (around 05/22/2025). You can have the follow up appointment with Dr. Iva or a Nurse Practicioner (our Nurse Practitioners are excellent and always have Physician oversight!).    Please inform us  of any Emergency Department visits, hospitalizations, or changes in symptoms. Call us  before going to the ED for breathing or allergy symptoms since we might be able to fit you in for a sick visit. Feel free to contact us  anytime with any questions, problems, or concerns.  It was a pleasure to see you again today!  Websites that have reliable patient information: 1. American Academy of Asthma, Allergy, and Immunology: www.aaaai.org 2. Food Allergy Research and Education (FARE): foodallergy.org 3. Mothers of Asthmatics: http://www.asthmacommunitynetwork.org 4. American College of Allergy, Asthma, and  Immunology: www.acaai.org      "Like" us  on Facebook and Instagram for our latest updates!      A healthy democracy works best when [employer] participate! Make sure you are registered to vote! If you have moved or changed any of your contact information, you will need to get this updated before voting! Scan the QR codes below to learn more!       Intradermal - 05/22/24 1507     Time Antigen Placed 1515    Allergen Manufacturer Jestine    Location Arm    Number of Test 13    Control Negative    Bahia Negative    Johnson Negative    7 Grass Negative    Ragweed Mix 4+    Weed Mix Negative    Tree Mix Negative    Mold 1 Negative    Mold 2 Negative    Mold 3 Negative    Mold 4 Negative    Cat Negative    Dog 2+          Food Adult Perc - 05/22/24 1500     Time Antigen Placed 1515    Allergen Manufacturer Greer    Location Back    Number of allergen test 33     Control-buffer 50% Glycerol Negative    Control-Histamine 2+    1. Peanut Negative    5. Milk, Cow Negative    6. Casein Negative    8. Shellfish Mix Negative    9. Fish Mix Negative    10. Cashew Negative    11. [facility] Food Negative  12. Almond --   4 x 6   13. Hazelnut Negative    14. Pecan Food Negative    15. Pistachio Negative    16. Brazil Nut Negative    23. Shrimp Negative    26. Oyster Negative    27. Scallops Negative    29. Rice Negative    35. Pork --   5 x 8   36. Beef Negative    37. Lamb --   6 x 10   40. Sweet Potato Negative    46. Mushrooms Negative    50. Carrots --   4 x 8   52. Corn --   4 x 6   55. Orange  Negative    57. Banana Negative    60. Strawberry Negative    63. Cantaloupe Negative    66. Chocolate/Cacao Bean Negative    67. Cinnamon Negative    70. Garlic Negative          Control of Dog or Cat Allergen  Avoidance is the best way to manage a dog or cat allergy. If you have a dog or cat and are allergic to dog or cats, consider removing the dog or cat  from the home. If you have a dog or cat but don't want to find it a new home, or if your family wants a pet even though someone in the household is allergic, here are some strategies that may help keep symptoms at bay:  Keep the pet out of your bedroom and restrict it to only a few rooms. Be advised that keeping the dog or cat in only one room will not limit the allergens to that room. Don't pet, hug or kiss the dog or cat; if you do, wash your hands with soap and water. High-efficiency particulate air (HEPA) cleaners run continuously in a bedroom or living room can reduce allergen levels over time. Regular use of a high-efficiency vacuum cleaner or a central vacuum can reduce allergen levels. Giving your dog or cat a bath at least once a week can reduce airborne allergen.  Reducing Pollen Exposure  The American Academy of Allergy, Asthma and Immunology suggests the following steps to reduce your exposure to pollen during allergy seasons.    Do not hang sheets or clothing out to dry; pollen may collect on these items. Do not mow lawns or spend time around freshly cut grass; mowing stirs up pollen. Keep windows closed at night.  Keep car windows closed while driving. Minimize morning activities outdoors, a time when pollen counts are usually at their highest. Stay indoors as much as possible when pollen counts or humidity is high and on windy days when pollen tends to remain in the air longer. Use air conditioning when possible.  Many air conditioners have filters that trap the pollen spores. Use a HEPA room air filter to remove pollen form the indoor air you breathe.

## 2024-05-22 NOTE — Progress Notes (Unsigned)
 FOLLOW UP  Date of Service/Encounter:  05/22/24   Assessment:   No diagnosis found.  Plan/Recommendations:   There are no Patient Instructions on file for this visit.   Subjective:   [name] is a 73 y.o. male presenting today for follow up of No chief complaint on file.   [name] has a history of the following: Patient Active Problem List   Diagnosis Date Noted   Deformity of sternum 11/09/2023   Allergic reaction 04/16/2022   Amnestic MCI (mild cognitive impairment with memory loss) 03/11/2022   Attention deficit 03/11/2022   Small vessel disease, cerebrovascular 03/11/2022   Ganglion cyst of both wrists 01/04/2022   Elevated LFTs 03/20/2020   Benign prostatic hyperplasia without lower urinary tract symptoms 03/20/2020   Skin lesion of scalp 03/20/2020   Pseudodementia 03/05/2020   Cerebral microvascular disease 03/05/2020   Severe episode of recurrent major depressive disorder, without psychotic features (HCC) 03/05/2020   Coronary artery calcification seen on CAT scan 03/17/2018   Ischemic brain damage 08/17/2017   Low back pain 08/24/2016   Encounter for general adult medical examination with abnormal findings 07/18/2015   Thyroid  nodule 04/21/2015   AAA (abdominal aortic aneurysm) without rupture 04/15/2015   MCI (mild cognitive impairment) 08/26/2014   Peripheral neuropathy 03/21/2013   DM2 (diabetes mellitus, type 2) (HCC)    Brachial plexopathy    Anxiety    Occlusion and stenosis of carotid artery without mention of cerebral infarction 04/11/2012   Vitamin D  deficiency 12/17/2008   Hyperlipidemia associated with type 2 diabetes mellitus (HCC) 12/17/2008   OBSTRUCTIVE SLEEP APNEA 12/17/2008   Migraine 12/17/2008   ALLERGIC RHINITIS 12/17/2008   Asthma 12/17/2008    History obtained from: chart review and {Persons; PED relatives w/patient:19415::patient}.  Discussed the use of AI scribe software for clinical note transcription  with the patient and/or guardian, who gave verbal consent to proceed.  [name] is a 73 y.o. male presenting for {Blank single:19197::a food challenge,a drug challenge,skin testing,a sick visit,an evaluation of ***,a follow up visit}.  He was last seen in October 2024.  At that time, we continue to avoid alpha gal.  We also continued with an EpiPen  for stinging insect allergy.  For his rhinitis, we will continue with Allegra and Flonase .  He was also interested in doing allergy testing.  Asthma/Respiratory Symptom History: ***  Allergic Rhinitis Symptom History: ***  Food Allergy Symptom History: ***  Skin Symptom History: ***  GERD Symptom History: ***  Infection Symptom History: ***  Otherwise, there have been no changes to his past medical history, surgical history, family history, or social history.    Review of systems otherwise negative other than that mentioned in the HPI.    Objective:   There were no vitals taken for this visit. There is no height or weight on file to calculate BMI.    Physical Exam   Diagnostic studies: {Blank single:19197::none,deferred due to recent antihistamine use,deferred due to insurance stipulations that require a separate visit for testing,labs sent instead, }  Spirometry: {Blank single:19197::results normal (FEV1: ***%, FVC: ***%, FEV1/FVC: ***%),results abnormal (FEV1: ***%, FVC: ***%, FEV1/FVC: ***%)}.    {Blank single:19197::Spirometry consistent with mild obstructive disease,Spirometry consistent with moderate obstructive disease,Spirometry consistent with severe obstructive disease,Spirometry consistent with possible restrictive disease,Spirometry consistent with mixed obstructive and restrictive disease,Spirometry uninterpretable due to technique,Spirometry consistent with normal pattern}. {Blank single:19197::Albuterol /Atrovent nebulizer,Xopenex /Atrovent nebulizer,Albuterol   nebulizer,Albuterol  four puffs via MDI,Xopenex  four puffs via MDI} treatment given in clinic with {  Blank single:19197::significant improvement in FEV1 per ATS criteria,significant improvement in FVC per ATS criteria,significant improvement in FEV1 and FVC per ATS criteria,improvement in FEV1, but not significant per ATS criteria,improvement in FVC, but not significant per ATS criteria,improvement in FEV1 and FVC, but not significant per ATS criteria,no improvement}.  Allergy Studies: {Blank single:19197::none,deferred due to recent antihistamine use,deferred due to insurance stipulations that require a separate visit for testing,labs sent instead, }    {Blank single:19197::Allergy testing results were read and interpreted by myself, documented by clinical staff., }      Marty Shaggy, MD  [facility] 

## 2024-05-23 ENCOUNTER — Encounter: Payer: Self-pay | Admitting: Allergy & Immunology

## 2024-05-24 ENCOUNTER — Other Ambulatory Visit (HOSPITAL_COMMUNITY): Payer: Self-pay

## 2024-05-24 MED ORDER — SODIUM FLUORIDE 1.1 % DT GEL
DENTAL | 12 refills | Status: AC
  Filled 2024-05-24: qty 100, 30d supply, fill #0

## 2024-05-25 ENCOUNTER — Encounter: Payer: Self-pay | Admitting: Cardiovascular Disease

## 2024-05-28 ENCOUNTER — Encounter: Payer: Self-pay | Admitting: Pharmacist

## 2024-05-28 NOTE — Progress Notes (Signed)
 Pharmacy Quality Measure Review  This patient is appearing on a report for being at risk of failing the adherence measure for cholesterol (statin) medications this calendar year.   Medication: Rosuvastatin  Last fill date: 04/20/24 for 90 day supply  Insurance report was not up to date. No action needed at this time.   Darrelyn Drum, PharmD, BCPS, CPP Clinical Pharmacist Practitioner [facility] [phone]

## 2024-05-29 ENCOUNTER — Other Ambulatory Visit (HOSPITAL_BASED_OUTPATIENT_CLINIC_OR_DEPARTMENT_OTHER): Payer: Self-pay

## 2024-05-30 ENCOUNTER — Ambulatory Visit: Payer: Self-pay

## 2024-05-30 NOTE — Telephone Encounter (Signed)
 FYI Only or Action Required?: FYI only for provider: appointment scheduled on 05/31/24.  Patient was last seen in primary care on 11/08/2023 by Ryan Almarie LABOR, MD.  Called Nurse Triage reporting Cough.  Symptoms began several days ago.  Interventions attempted: Nothing.  Symptoms are: unchanged.  Triage Disposition: See Physician Within 24 Hours  Patient/caregiver understands and will follow disposition?: Yes    Copied from CRM #[id number]. Topic: Clinical - Red Word Triage >> May 30, 2024  2:17 PM [name] wrote: Red Word that prompted transfer to Nurse Triage: Cold symptoms. Would like to know if he should come in   Shortness of breath, nasal and head congestion, ears stopped up Reason for Disposition  [1] Continuous (nonstop) coughing interferes with work or school AND [2] no improvement using cough treatment per Care Advice  Answer Assessment - Initial Assessment Questions No available appts with pcp, scheduled appt 05/31/24. Advised call back or ED/911 if symptoms worsen. Patient verbalized understanding.   1. ONSET: When did the cough begin?      Week ago, congestion, burning sensation, nasal and head congestion Negative covid test, just don't feel well 2. SEVERITY: How bad is the cough today?      Wet hacking cough 3. SPUTUM: Describe the color of your sputum (e.g., none, dry cough; clear, white, yellow, green)     Just feels in chest 4. HEMOPTYSIS: Are you coughing up any blood? If Yes, ask: How much? (e.g., flecks, streaks, tablespoons, etc.)     no 5. DIFFICULTY BREATHING: Are you having difficulty breathing? If Yes, ask: How bad is it? (e.g., mild, moderate, severe)      Mild; sob with exertion 6. FEVER: Do you have a fever? If Yes, ask: What is your temperature, how was it measured, and when did it start?    Feel feverish, 97.6, denies chills n/v 7. CARDIAC HISTORY: Do you have any history of heart disease? (e.g., heart attack,  congestive heart failure)      Aortic aneursym, triple AAA surgery 8. LUNG HISTORY: Do you have any history of lung disease?  (e.g., pulmonary embolus, asthma, emphysema)     no 9. PE RISK FACTORS: Do you have a history of blood clots? (or: recent major surgery, recent prolonged travel, bedridden)     no 10. OTHER SYMPTOMS: Do you have any other symptoms? (e.g., runny nose, wheezing, chest pain)       Denies chest pain, diff breathing, wheezing, sore throat  12. TRAVEL: Have you traveled out of the country in the last month? (e.g., travel history, exposures)       no  Protocols used: Cough - Acute Productive-A-AH

## 2024-05-31 ENCOUNTER — Other Ambulatory Visit (HOSPITAL_BASED_OUTPATIENT_CLINIC_OR_DEPARTMENT_OTHER): Payer: Self-pay

## 2024-05-31 ENCOUNTER — Ambulatory Visit: Admitting: Adult Health

## 2024-05-31 ENCOUNTER — Encounter: Payer: Self-pay | Admitting: Adult Health

## 2024-05-31 VITALS — BP 150/82 | HR 68 | Temp 98.1°F | Ht 76.0 in | Wt 225.0 lb

## 2024-05-31 DIAGNOSIS — J0100 Acute maxillary sinusitis, unspecified: Secondary | ICD-10-CM | POA: Diagnosis not present

## 2024-05-31 DIAGNOSIS — J988 Other specified respiratory disorders: Secondary | ICD-10-CM | POA: Diagnosis not present

## 2024-05-31 DIAGNOSIS — I10 Essential (primary) hypertension: Secondary | ICD-10-CM | POA: Diagnosis not present

## 2024-05-31 LAB — POCT INFLUENZA A/B: Influenza A, POC: NEGATIVE

## 2024-05-31 LAB — POCT RAPID STREP A (OFFICE): Rapid Strep A Screen: NEGATIVE

## 2024-05-31 MED ORDER — DOXYCYCLINE HYCLATE 100 MG PO TABS
100.0000 mg | ORAL_TABLET | Freq: Two times a day (BID) | ORAL | 0 refills | Status: AC
  Filled 2024-05-31: qty 14, 7d supply, fill #0

## 2024-05-31 NOTE — Progress Notes (Signed)
 Subjective:    Patient ID: [name], male    DOB: 12-28-1950, 73 y.o.   MRN: [id number]  HPI Discussed the use of AI scribe software for clinical note transcription with the patient, who gave verbal consent to proceed.  History of Present Illness   [name] is a 73 year old male who presents with progressive respiratory symptoms and concerns about a possible sinus infection.  About seven days ago he developed mild itching throat, occasional cough, and slight head and chest congestion, which have progressively worsened with increased weakness, runny nose and sinus congestion. He now has a burning sensation from his throat into his lungs, similar to prior episodes many years ago. He notes new dental pain this morning. He has not had documented fever, with home readings around 51F, though he is unsure of thermometer accuracy.  He has been taking an over-the-counter liquid cold medication without much improvement.     He routinely checks his blood pressure, which is usually in the 130s and occasionally 140s, and reports no recent readings at or above 150.        Review of Systems See HPI   Past Medical History:  Diagnosis Date   AAA (abdominal aortic aneurysm)    ALLERGIC RHINITIS    Anemia    post op, treated /w Fe   Anxiety    Arthritis    OA, hands, - thumbs- specifically   Brachial plexopathy    double crush injury with median nerve damage on the right dominant hand.    COPD (chronic obstructive pulmonary disease) (HCC)    also states he has been told that he has slight emphysema    Dissecting aortic aneurysm, thoracic (HCC) 02/20/2012   s/p emergent surgical repair   DM2 (diabetes mellitus, type 2) (HCC)    DVT (deep venous thrombosis) (HCC)    coumadin - x3 months , post op   ERECTILE DYSFUNCTION, ORGANIC    GERD (gastroesophageal reflux disease)    related to diet & anxiety    HYPERLIPIDEMIA    Hypertension    pt. followwed by Dr. Delford   MIGRAINE  HEADACHE    induced from physical , emotional stress , but sometimes occur randomly, also experiences BPPV   OBSTRUCTIVE SLEEP APNEA 02/2008 sleep study   moderate & central- not able to tolerate CPAP    Thyroid  nodule 04/21/2015   2.3 cm on right, incidental finding on 03/2015 CT chest - f/u US  ordered   Unspecified vitamin D  deficiency     Social History   Socioeconomic History   Marital status: Divorced    Spouse name: Not on file   Number of children: 2   Years of education: acad. deg.   Highest education level: Not on file  Occupational History   Occupation: internal med resident    Employer: [employer]   Occupation: RETIRED  Tobacco Use   Smoking status: Former    Current packs/day: 0.00    Average packs/day: 0.1 packs/day    Types: Cigarettes    Quit date: 02/18/2012    Years since quitting: 12.2   Smokeless tobacco: Never   Tobacco comments:    10 cigs/day, quit 02/2012 after TAA emergent repair  Vaping Use   Vaping status: Never Used  Substance and Sexual Activity   Alcohol use: Yes    Alcohol/week: 1.0 standard drink of alcohol    Types: 1 Shots of liquor per week    Comment: shot a week  Drug use: No   Sexual activity: Not on file  Other Topics Concern   Not on file  Social History Narrative   Patient is single and lives at home alone.    Financial controller.   Patient works at [employer] full time.   Right handed.   Caffeine one cup of coffee daily.    Social Drivers of Health   Tobacco Use: Medium Risk (05/31/2024)   Patient History    Smoking Tobacco Use: Former    Smokeless Tobacco Use: Never    Passive Exposure: Not on file  Financial Resource Strain: Medium Risk (06/22/2023)   Overall Financial Resource Strain (CARDIA)    Difficulty of Paying Living Expenses: Somewhat hard  Food Insecurity: No Food Insecurity (06/22/2023)   Hunger Vital Sign    Worried About Running Out of Food in the Last Year: Never true    Ran Out of Food in the Last Year:  Never true  Transportation Needs: No Transportation Needs (06/22/2023)   PRAPARE - Administrator, Civil Service (Medical): No    Lack of Transportation (Non-Medical): No  Physical Activity: Inactive (06/22/2023)   Exercise Vital Sign    Days of Exercise per Week: 0 days    Minutes of Exercise per Session: 0 min  Stress: Stress Concern Present (06/22/2023)   [employer] of Occupational Health - Occupational Stress Questionnaire    Feeling of Stress : Rather much  Social Connections: Socially Isolated (06/22/2023)   Social Connection and Isolation Panel    Frequency of Communication with Friends and Family: Twice a week    Frequency of Social Gatherings with Friends and Family: Once a week    Attends Religious Services: Never    Database Administrator or Organizations: No    Attends Banker Meetings: Never    Marital Status: Divorced  Catering Manager Violence: Patient Unable To Answer (06/22/2023)   Humiliation, Afraid, Rape, and Kick questionnaire    Fear of Current or Ex-Partner: Patient unable to answer    Emotionally Abused: Patient unable to answer    Physically Abused: Patient unable to answer    Sexually Abused: Patient unable to answer  Depression (PHQ2-9): Low Risk (06/22/2023)   Depression (PHQ2-9)    PHQ-2 Score: 4  Alcohol Screen: Low Risk (06/22/2023)   Alcohol Screen    Last Alcohol Screening Score (AUDIT): 4  Housing: Unknown (06/22/2023)   Housing Stability Vital Sign    Unable to Pay for Housing in the Last Year: No    Number of Times Moved in the Last Year: Not on file    Homeless in the Last Year: No  Utilities: Not At Risk (06/22/2023)   AHC Utilities    Threatened with loss of utilities: No  Health Literacy: Adequate Health Literacy (06/22/2023)   B1300 Health Literacy    Frequency of need for help with medical instructions: Never    Past Surgical History:  Procedure Laterality Date   ABDOMINAL AORTIC ANEURYSM REPAIR N/A 11/01/2012    Procedure: ANEURYSM ABDOMINAL AORTIC REPAIR;  Surgeon: Lynwood JONETTA Collum, MD;  Location: [facility] OR;  Service: Vascular;  Laterality: N/A;  Resection and Grafting of Abdominal Aortic Aneurysm,(AORTA BI ILIAC)   bone implant oral     Nasal fx without repair     THORACIC AORTIC ANEURYSM REPAIR  02/20/2012   Procedure: THORACIC ASCENDING ANEURYSM REPAIR (AAA);  Surgeon: Maude Fleeta Ochoa, MD;  Location: [facility] OR;  Service: Open Heart Surgery;  Laterality:  N/A;    Family History  Problem Relation Age of Onset   Diabetes Mother    Stroke Mother    Dementia Mother    Deep vein thrombosis Father        PE   Varicose Veins Father    Heart disease Maternal Grandmother    Heart disease Maternal Uncle    Heart disease Paternal Uncle    Heart disease Cousin    Lung cancer Cousin        2 cousins    Allergies[1]  Medications Ordered Prior to Encounter[2]  BP (!) 150/82   Pulse 68   Temp 98.1 F (36.7 C) (Oral)   Ht 6' 4 (1.93 m)   Wt 225 lb (102.1 kg)   SpO2 98%   BMI 27.39 kg/m       Objective:   Physical Exam Vitals and nursing note reviewed.  Constitutional:      Appearance: Normal appearance. He is ill-appearing (acutely).  HENT:     Right Ear: Tympanic membrane, ear canal and external ear normal. There is no impacted cerumen.     Left Ear: Tympanic membrane, ear canal and external ear normal. There is no impacted cerumen.     Nose: Congestion and rhinorrhea present. Rhinorrhea is purulent.     Right Turbinates: Enlarged and swollen.     Left Turbinates: Enlarged and swollen.     Right Sinus: Maxillary sinus tenderness present.     Left Sinus: Maxillary sinus tenderness present.     Mouth/Throat:     Pharynx: Oropharynx is clear. No pharyngeal swelling or posterior oropharyngeal erythema.     Tonsils: No tonsillar exudate or tonsillar abscesses.  Cardiovascular:     Rate and Rhythm: Normal rate and regular rhythm.     Pulses: Normal pulses.     Heart sounds: Normal heart sounds.   Pulmonary:     Effort: Pulmonary effort is normal.     Breath sounds: Normal breath sounds.  Musculoskeletal:        General: Normal range of motion.  Skin:    General: Skin is warm and dry.  Neurological:     General: No focal deficit present.     Mental Status: He is alert and oriented to person, place, and time.  Psychiatric:        Mood and Affect: Mood normal.        Behavior: Behavior normal.        Thought Content: Thought content normal.        Judgment: Judgment normal.        Assessment & Plan:  Assessment and Plan    Acute maxillary sinusitis Likely bacterial etiology due to symptom duration and progression. Sinus tenderness and dental pain indicate sinus involvement. - Prescribed doxycycline  100 mg BID for 7 days. - Advised Mucinex  for decongestion. - Encouraged increased water intake.   Primary hypertension Office reading 150/80 mmHg x 2.  Home readings 120-140 mmHg systolic. - Monitor blood pressure at home. - Consult primary care provider if home readings remain elevated.      Kalyn Dimattia, NP      [1]  Allergies Allergen Reactions   Alpha-Gal Anaphylaxis    avoid all beef, pork, venison, rabbit and lamb.  You should also avoid gelatin.   Testosterone  Shortness Of Breath    Medication:Androgel  Pt states trouble breathing at night   Codeine Nausea And Vomiting   Dapagliflozin  Other (See Comments)   Gabapentin      drunk  feeling   Glycerin    Heparin     Other     Cat gut sutures  Bone char   Ultram  [Tramadol ] Nausea And Vomiting   Albuterol  Other (See Comments)    Panic attack  [2]  Current Outpatient Medications on File Prior to Visit  Medication Sig Dispense Refill   ALPRAZolam  (XANAX ) 1 MG tablet Take 1/2 tablet (0.5 mg total) by mouth 4 (four) times daily. 60 tablet 5   aspirin  EC 81 MG tablet Take 81 mg by mouth daily with lunch.     azelastine  (ASTELIN ) 0.1 % nasal spray Place 2 sprays into both nostrils 2 (two) times daily.  Office visit is due 30 mL 11   calcium  carbonate (TUMS EX) 750 MG chewable tablet Chew 1 tablet by mouth daily as needed for heartburn.      Cetirizine HCl (ZYRTEC PO) Take by mouth. As needed     chlorhexidine  (PERIDEX ) 0.12 % solution Rinse mouth with (1 CAPFUL) for 30 seconds in the AM and PM after toothbrushing. Spit out after rinsing, do not swallow. 473 mL 2   cholecalciferol  (VITAMIN D ) 1000 UNITS tablet Take 2,000 Units by mouth daily.     fexofenadine (ALLEGRA) 180 MG tablet Take 90 mg by mouth 2 (two) times daily.     fexofenadine (ALLEGRA) 180 MG tablet Take 180 mg by mouth daily.     fluticasone  (FLONASE ) 50 MCG/ACT nasal spray Place 2 sprays into both nostrils daily. 48 g 2   hydrochlorothiazide  (HYDRODIURIL ) 25 MG tablet Take 1 tablet (25 mg total) by mouth daily. 90 tablet 3   metFORMIN  (GLUCOPHAGE -XR) 500 MG 24 hr tablet Take 3 tablets (1,500 mg total) by mouth daily with breakfast. 270 tablet 3   metoprolol  tartrate (LOPRESSOR ) 25 MG tablet Take 1 tablet (25 mg total) by mouth 3 (three) times daily. 270 tablet 3   montelukast  (SINGULAIR ) 10 MG tablet Take 1/2 tablet (5 mg total) by mouth 2 (two) times daily. 90 tablet 1   Multiple Vitamin (MULTIVITAMIN WITH MINERALS) TABS Take 1 tablet by mouth daily.     omega-3 acid ethyl esters (LOVAZA ) 1 g capsule Take 1 capsule (1 g total) by mouth 2 (two) times daily. 180 capsule 3   OVER THE COUNTER MEDICATION Apply 1 application topically daily as needed (for acne). Oxy 10 vanishing cream     polyethylene glycol powder (GLYCOLAX /MIRALAX ) 17 GM/SCOOP powder Take 17 g (1 capful) by mouth daily. 527 g 5   predniSONE  (DELTASONE ) 5 MG tablet Take as directed. 20 tablet 0   PREVIDENT 5000 DRY MOUTH 1.1 % GEL dental gel APPLY THIN RIBBON AND BRUSH FOR 2 MINUTES DAILY IN PLACE OF CONVENTIONAL TOOTHPASTE  99   rosuvastatin  (CRESTOR ) 10 MG tablet Take 1 tablet (10 mg total) by mouth daily. 90 tablet 3   sodium fluoride  (PREVIDENT 5000 DRY  MOUTH) 1.1 % GEL dental gel Apply a thin ribbon or pea-sized amount to toothbrush and brush teeth thoroughly for 2 minutes. Use daily in place of regular toothpaste. 100 mL 12   thiamine (VITAMIN B-1) 100 MG tablet Take 100 mg by mouth 2 (two) times daily at 10 AM and 5 PM.      tiZANidine  (ZANAFLEX ) 2 MG tablet Take 1 tablet (2 mg total) by mouth every 6 (six) hours as needed for muscle spasms. 30 tablet 0   triamcinolone  cream (KENALOG ) 0.1 % Apply 1 Application topically 2 (two) times daily. Apply to itchy areas as needed. Taper  as able 454 g 1   triamcinolone  cream (KENALOG ) 0.1 % Apply 1 Application topically to affected area 2 (two) times daily as directed. 454 g 3   TURMERIC CURCUMIN PO Take by mouth.     vitamin B-12 (CYANOCOBALAMIN ) 500 MCG tablet Take 500-1,000 mcg by mouth daily.      [DISCONTINUED] oxyCODONE  (OXY IR/ROXICODONE ) 5 MG immediate release tablet Take 1 tablet (5 mg total) by mouth every 4 (four) hours as needed. 30 tablet 0   No current facility-administered medications on file prior to visit.

## 2024-06-01 ENCOUNTER — Other Ambulatory Visit (HOSPITAL_BASED_OUTPATIENT_CLINIC_OR_DEPARTMENT_OTHER): Payer: Self-pay

## 2024-06-06 ENCOUNTER — Other Ambulatory Visit (HOSPITAL_COMMUNITY): Payer: Self-pay

## 2024-06-06 ENCOUNTER — Other Ambulatory Visit: Payer: Self-pay

## 2024-06-18 ENCOUNTER — Other Ambulatory Visit (HOSPITAL_COMMUNITY): Payer: Self-pay

## 2024-06-21 ENCOUNTER — Other Ambulatory Visit (HOSPITAL_COMMUNITY): Payer: Self-pay

## 2024-06-21 ENCOUNTER — Other Ambulatory Visit: Payer: Self-pay | Admitting: Surgery

## 2024-06-21 DIAGNOSIS — Z8679 Personal history of other diseases of the circulatory system: Secondary | ICD-10-CM

## 2024-06-21 MED ORDER — AMOXICILLIN 500 MG PO CAPS
500.0000 mg | ORAL_CAPSULE | Freq: Three times a day (TID) | ORAL | 0 refills | Status: DC
  Filled 2024-06-21: qty 21, 7d supply, fill #0

## 2024-06-21 MED ORDER — HYDROCODONE-ACETAMINOPHEN 5-325 MG PO TABS
1.0000 | ORAL_TABLET | Freq: Four times a day (QID) | ORAL | 0 refills | Status: DC | PRN
  Filled 2024-06-21: qty 8, 2d supply, fill #0

## 2024-06-27 ENCOUNTER — Ambulatory Visit (INDEPENDENT_AMBULATORY_CARE_PROVIDER_SITE_OTHER)

## 2024-06-27 VITALS — Ht 76.0 in | Wt 225.0 lb

## 2024-06-27 DIAGNOSIS — Z0000 Encounter for general adult medical examination without abnormal findings: Secondary | ICD-10-CM

## 2024-06-27 NOTE — Progress Notes (Addendum)
 "  Chief Complaint  Patient presents with   Medicare Wellness     Subjective:  Please attest and cosign this visit due to patients primary care provider not being in the office at the time the visit was completed.  (Pt of Dr Almarie Cleveland)   [name] is a 74 y.o. male who presents for a Medicare Annual Wellness Visit.  Visit info / Clinical Intake: Medicare Wellness Visit Type:: Subsequent Annual Wellness Visit Persons participating in visit and providing information:: patient Medicare Wellness Visit Mode:: Telephone If telephone:: video declined Since this visit was completed virtually, some vitals may be partially provided or unavailable. Missing vitals are due to the limitations of the virtual format.: Documented vitals are patient reported If Telephone or Video please confirm:: I connected with patient using audio/video enable telemedicine. I verified patient identity with two identifiers, discussed telehealth limitations, and patient agreed to proceed. Patient Location:: Home Provider Location:: Office Interpreter Needed?: No Pre-visit prep was completed: yes AWV questionnaire completed by patient prior to visit?: yes Date:: 06/26/24 Living arrangements:: (!) lives alone Patient's Overall Health Status Rating: good Typical amount of pain: some Does pain affect daily life?: no Are you currently prescribed opioids?: no  Dietary Habits and Nutritional Risks How many meals a day?: 3 (2-3) Eats fruit and vegetables daily?: yes Most meals are obtained by: preparing own meals In the last 2 weeks, have you had any of the following?: none Diabetic:: no  Functional Status Activities of Daily Living (to include ambulation/medication): Independent Ambulation: Independent with device- listed below Home Assistive Devices/Equipment: Eyeglasses Medication Administration: Independent Home Management (perform basic housework or laundry): Independent Manage your own  finances?: yes Primary transportation is: driving Concerns about vision?: no *vision screening is required for WTM* Concerns about hearing?: no  Fall Screening Falls in the past year?: 0 Number of falls in past year: 0 Was there an injury with Fall?: 0 Fall Risk Category Calculator: 0 Patient Fall Risk Level: Low Fall Risk  Fall Risk Patient at Risk for Falls Due to: No Fall Risks Fall risk Follow up: Falls evaluation completed; Falls prevention discussed  Home and Transportation Safety: All rugs have non-skid backing?: yes All stairs or steps have railings?: yes Grab bars in the bathtub or shower?: yes Have non-skid surface in bathtub or shower?: yes Good home lighting?: yes Regular seat belt use?: yes Hospital stays in the last year:: no  Cognitive Assessment Difficulty concentrating, remembering, or making decisions? : yes Will 6CIT or Mini Cog be Completed: yes What year is it?: 0 points What month is it?: 0 points Give patient an address phrase to remember (5 components): [address] [place] About what time is it?: 0 points Count backwards from 20 to 1: 0 points Say the months of the year in reverse: 0 points Repeat the address phrase from earlier: 2 points (Drive) 6 CIT Score: 2 points  Advance Directives (For Healthcare) Does Patient Have a Medical Advance Directive?: No Would patient like information on creating a medical advance directive?: No - Patient declined  Reviewed/Updated  Reviewed/Updated: Reviewed All (Medical, Surgical, Family, Medications, Allergies, Care Teams, Patient Goals)    Allergies (verified) Alpha-gal, Testosterone , Codeine, Dapagliflozin , Gabapentin , Glycerin, Heparin , Other, Ultram  [tramadol ], and Albuterol    Current Medications (verified) Outpatient Encounter Medications as of 06/27/2024  Medication Sig   ALPRAZolam  (XANAX ) 1 MG tablet Take 1/2 tablet (0.5 mg total) by mouth 4 (four) times daily.   amoxicillin  (AMOXIL ) 500  MG capsule Take  1 capsule (500 mg total) by mouth 3 (three) times daily.   aspirin  EC 81 MG tablet Take 81 mg by mouth daily with lunch.   azelastine  (ASTELIN ) 0.1 % nasal spray Place 2 sprays into both nostrils 2 (two) times daily. Office visit is due   calcium  carbonate (TUMS EX) 750 MG chewable tablet Chew 1 tablet by mouth daily as needed for heartburn.    Cetirizine HCl (ZYRTEC PO) Take by mouth. As needed   chlorhexidine  (PERIDEX ) 0.12 % solution Rinse mouth with (1 CAPFUL) for 30 seconds in the AM and PM after toothbrushing. Spit out after rinsing, do not swallow.   cholecalciferol  (VITAMIN D ) 1000 UNITS tablet Take 2,000 Units by mouth daily.   fexofenadine (ALLEGRA) 180 MG tablet Take 90 mg by mouth 2 (two) times daily.   fexofenadine (ALLEGRA) 180 MG tablet Take 180 mg by mouth daily.   fluticasone  (FLONASE ) 50 MCG/ACT nasal spray Place 2 sprays into both nostrils daily.   hydrochlorothiazide  (HYDRODIURIL ) 25 MG tablet Take 1 tablet (25 mg total) by mouth daily.   metFORMIN  (GLUCOPHAGE -XR) 500 MG 24 hr tablet Take 3 tablets (1,500 mg total) by mouth daily with breakfast.   metoprolol  tartrate (LOPRESSOR ) 25 MG tablet Take 1 tablet (25 mg total) by mouth 3 (three) times daily.   montelukast  (SINGULAIR ) 10 MG tablet Take 1/2 tablet (5 mg total) by mouth 2 (two) times daily.   Multiple Vitamin (MULTIVITAMIN WITH MINERALS) TABS Take 1 tablet by mouth daily.   omega-3 acid ethyl esters (LOVAZA ) 1 g capsule Take 1 capsule (1 g total) by mouth 2 (two) times daily.   OVER THE COUNTER MEDICATION Apply 1 application topically daily as needed (for acne). Oxy 10 vanishing cream   polyethylene glycol powder (GLYCOLAX /MIRALAX ) 17 GM/SCOOP powder Take 17 g (1 capful) by mouth daily.   predniSONE  (DELTASONE ) 5 MG tablet Take as directed.   PREVIDENT 5000 DRY MOUTH 1.1 % GEL dental gel APPLY THIN RIBBON AND BRUSH FOR 2 MINUTES DAILY IN PLACE OF CONVENTIONAL TOOTHPASTE   rosuvastatin  (CRESTOR ) 10 MG  tablet Take 1 tablet (10 mg total) by mouth daily.   sodium fluoride  (PREVIDENT 5000 DRY MOUTH) 1.1 % GEL dental gel Apply a thin ribbon or pea-sized amount to toothbrush and brush teeth thoroughly for 2 minutes. Use daily in place of regular toothpaste.   thiamine (VITAMIN B-1) 100 MG tablet Take 100 mg by mouth 2 (two) times daily at 10 AM and 5 PM.    tiZANidine  (ZANAFLEX ) 2 MG tablet Take 1 tablet (2 mg total) by mouth every 6 (six) hours as needed for muscle spasms.   triamcinolone  cream (KENALOG ) 0.1 % Apply 1 Application topically 2 (two) times daily. Apply to itchy areas as needed. Taper as able   triamcinolone  cream (KENALOG ) 0.1 % Apply 1 Application topically to affected area 2 (two) times daily as directed.   TURMERIC CURCUMIN PO Take by mouth.   vitamin B-12 (CYANOCOBALAMIN ) 500 MCG tablet Take 500-1,000 mcg by mouth daily.    HYDROcodone -acetaminophen  (NORCO/VICODIN) 5-325 MG tablet Take 1 tablet by mouth every 6 (six) hours as needed for pain. (Patient not taking: Reported on 06/27/2024)   [DISCONTINUED] oxyCODONE  (OXY IR/ROXICODONE ) 5 MG immediate release tablet Take 1 tablet (5 mg total) by mouth every 4 (four) hours as needed.   No facility-administered encounter medications on file as of 06/27/2024.    History: Past Medical History:  Diagnosis Date   AAA (abdominal aortic aneurysm)    ALLERGIC  RHINITIS    Anemia    post op, treated /w Fe   Anxiety    Arthritis    OA, hands, - thumbs- specifically   Brachial plexopathy    double crush injury with median nerve damage on the right dominant hand.    COPD (chronic obstructive pulmonary disease) (HCC)    also states he has been told that he has slight emphysema    Dissecting aortic aneurysm, thoracic (HCC) 02/20/2012   s/p emergent surgical repair   DM2 (diabetes mellitus, type 2) (HCC)    DVT (deep venous thrombosis) (HCC)    coumadin - x3 months , post op   ERECTILE DYSFUNCTION, ORGANIC    GERD (gastroesophageal reflux  disease)    related to diet & anxiety    HYPERLIPIDEMIA    Hypertension    pt. followwed by Dr. Delford   MIGRAINE HEADACHE    induced from physical , emotional stress , but sometimes occur randomly, also experiences BPPV   OBSTRUCTIVE SLEEP APNEA 02/2008 sleep study   moderate & central- not able to tolerate CPAP    Thyroid  nodule 04/21/2015   2.3 cm on right, incidental finding on 03/2015 CT chest - f/u US  ordered   Unspecified vitamin D  deficiency    Past Surgical History:  Procedure Laterality Date   ABDOMINAL AORTIC ANEURYSM REPAIR N/A 11/01/2012   Procedure: ANEURYSM ABDOMINAL AORTIC REPAIR;  Surgeon: Lynwood JONETTA Collum, MD;  Location: [facility] OR;  Service: Vascular;  Laterality: N/A;  Resection and Grafting of Abdominal Aortic Aneurysm,(AORTA BI ILIAC)   bone implant oral     Nasal fx without repair     THORACIC AORTIC ANEURYSM REPAIR  02/20/2012   Procedure: THORACIC ASCENDING ANEURYSM REPAIR (AAA);  Surgeon: Maude Fleeta Ochoa, MD;  Location: [facility] OR;  Service: Open Heart Surgery;  Laterality: N/A;   Family History  Problem Relation Age of Onset   Diabetes Mother    Stroke Mother    Dementia Mother    Deep vein thrombosis Father        PE   Varicose Veins Father    Heart disease Maternal Grandmother    Heart disease Maternal Uncle    Heart disease Paternal Uncle    Heart disease Cousin    Lung cancer Cousin        2 cousins   Social History   Occupational History   Occupation: internal med resident    Employer: [employer]   Occupation: RETIRED  Tobacco Use   Smoking status: Former    Current packs/day: 0.00    Types: Cigarettes    Quit date: 02/18/2012    Years since quitting: 12.3   Smokeless tobacco: Never   Tobacco comments:    10 cigs/day, quit 02/2012 after TAA emergent repair  Vaping Use   Vaping status: Never Used  Substance and Sexual Activity   Alcohol use: Not Currently   Drug use: No   Sexual activity: Not Currently   Tobacco Counseling Counseling given:  Yes Tobacco comments: 10 cigs/day, quit 02/2012 after TAA emergent repair  SDOH Screenings   Food Insecurity: No Food Insecurity (06/27/2024)  Housing: Low Risk (06/27/2024)  Transportation Needs: No Transportation Needs (06/27/2024)  Utilities: Not At Risk (06/27/2024)  Alcohol Screen: Low Risk (06/26/2024)  Depression (PHQ2-9): Low Risk (06/27/2024)  Financial Resource Strain: Low Risk (06/26/2024)  Physical Activity: Insufficiently Active (06/27/2024)  Social Connections: Socially Isolated (06/27/2024)  Stress: Stress Concern Present (06/27/2024)  Tobacco Use: Medium Risk (06/27/2024)  Health Literacy:  Adequate Health Literacy (06/27/2024)   See flowsheets for full screening details  Depression Screen PHQ 2 & 9 Depression Scale- Over the past 2 weeks, how often have you been bothered by any of the following problems? Little interest or pleasure in doing things: 0 Feeling down, depressed, or hopeless (PHQ Adolescent also includes...irritable): 0 PHQ-2 Total Score: 0 Trouble falling or staying asleep, or sleeping too much: 1 (sleeping pattern is different during seasons but gets about 6-7hrs of sleep) Feeling tired or having little energy: 0 Poor appetite or overeating (PHQ Adolescent also includes...weight loss): 0 Feeling bad about yourself - or that you are a failure or have let yourself or your family down: 0 Trouble concentrating on things, such as reading the newspaper or watching television (PHQ Adolescent also includes...like school work): 0 Moving or speaking so slowly that other people could have noticed. Or the opposite - being so fidgety or restless that you have been moving around a lot more than usual: 0 Thoughts that you would be better off dead, or of hurting yourself in some way: 0 PHQ-9 Total Score: 1 If you checked off any problems, how difficult have these problems made it for you to do your work, take care of things at home, or get along with other people?: Not difficult at  all  Depression Treatment Depression Interventions/Treatment : EYV7-0 Score <4 Follow-up Not Indicated     Goals Addressed               This Visit's Progress     Patient Stated (pt-stated)        Patient stated plans [facility]      Patient Stated (pt-stated)        Patient stated [facility] and engage more with people in addition to exercising there             Objective:    Today's Vitals   06/27/24 1510  Weight: 225 lb (102.1 kg)  Height: 6' 4 (1.93 m)   Body mass index is 27.39 kg/m.  Hearing/Vision screen Hearing Screening - Comments:: Denies hearing difficulties   Vision Screening - Comments:: Wears eyeglasses for reading - up to date with routine eye exams with [facility] Immunizations and Health Maintenance Health Maintenance  Topic Date Due   Diabetic kidney evaluation - Urine ACR  Never done   Zoster Vaccines- Shingrix (1 of 2) 05/08/2003   HEMOGLOBIN A1C  12/25/2023   FOOT EXAM  06/26/2024   COVID-19 Vaccine (9 - 2025-26 season) 09/14/2024   Diabetic kidney evaluation - eGFR measurement  11/07/2024   OPHTHALMOLOGY EXAM  02/06/2025   Medicare Annual Wellness (AWV)  06/27/2025   Fecal DNA (Cologuard)  12/05/2025   DTaP/Tdap/Td (6 - Td or Tdap) 02/26/2027   Pneumococcal Vaccine: 50+ Years  Completed   Influenza Vaccine  Completed   Hepatitis C Screening  Completed   Meningococcal B Vaccine  Aged Out   Hepatitis B Vaccines 19-59 Average Risk  Discontinued   Colonoscopy  Discontinued        Assessment/Plan:  This is a routine wellness examination for [name].  I have recommended that this patient have a immunization for Shingles but he declines at this time. I have discussed the risks and benefits of this procedure with him. The patient verbalizes understanding.   Patient Care Team: Rollene Almarie LABOR, MD as PCP - General (Internal Medicine) Delford Maude BROCKS, MD  as PCP -  Cardiology (Cardiology) Obadiah Coy, MD (Cardiothoracic Surgery) Ruthell Lynwood NOVAK, RPH-CPP (Pharmacist) Arvell Evalene SAUNDERS, DO (Sports Medicine) Dohmeier, Dedra, MD (Neurology) Fate Morna SAILOR, [facility] (Inactive) as Pharmacist (Pharmacist)  I have personally reviewed and noted the following in the patients chart:   Medical and social history Use of alcohol, tobacco or illicit drugs  Current medications and supplements including opioid prescriptions. Functional ability and status Nutritional status Physical activity Advanced directives List of other physicians Hospitalizations, surgeries, and ER visits in previous 12 months Vitals Screenings to include cognitive, depression, and falls Referrals and appointments  No orders of the defined types were placed in this encounter.  In addition, I have reviewed and discussed with patient certain preventive protocols, quality metrics, and best practice recommendations. A written personalized care plan for preventive services as well as general preventive health recommendations were provided to patient.   Verdie CHRISTELLA Saba, CMA   06/27/2024   Return in 1 year (on 06/27/2025).  After Visit Summary: (MyChart) Due to this being a telephonic visit, the after visit summary with patients personalized plan was offered to patient via MyChart   Nurse Notes: scheduled 2027 AWV appt "

## 2024-06-27 NOTE — Patient Instructions (Signed)
 [name],  Thank you for taking the time for your Medicare Wellness Visit. I appreciate your continued commitment to your health goals. Please review the care plan we discussed, and feel free to reach out if I can assist you further.  Please note that Annual Wellness Visits do not include a physical exam. Some assessments may be limited, especially if the visit was conducted virtually. If needed, we may recommend an in-person follow-up with your provider.  Ongoing Care Seeing your primary care provider every 3 to 6 months helps us  monitor your health and provide consistent, personalized care.   Referrals If a referral was made during today's visit and you haven't received any updates within two weeks, please contact the referred provider directly to check on the status.  Recommended Screenings:  Health Maintenance  Topic Date Due   Yearly kidney health urinalysis for diabetes  Never done   Zoster (Shingles) Vaccine (1 of 2) 05/08/2003   Hemoglobin A1C  12/25/2023   Complete foot exam   06/26/2024   COVID-19 Vaccine (9 - 2025-26 season) 09/14/2024   Yearly kidney function blood test for diabetes  11/07/2024   Eye exam for diabetics  02/06/2025   Medicare Annual Wellness Visit  06/27/2025   Cologuard (Stool DNA test)  12/05/2025   DTaP/Tdap/Td vaccine (6 - Td or Tdap) 02/26/2027   Pneumococcal Vaccine for age over 35  Completed   Flu Shot  Completed   Hepatitis C Screening  Completed   Meningitis B Vaccine  Aged Out   Hepatitis B Vaccine  Discontinued   Colon Cancer Screening  Discontinued       06/27/2024    3:14 PM  Advanced Directives  Does Patient Have a Medical Advance Directive? No  Would patient like information on creating a medical advance directive? No - Patient declined    Vision: Annual vision screenings are recommended for early detection of glaucoma, cataracts, and diabetic retinopathy. These exams can also reveal signs of chronic conditions such as diabetes and  high blood pressure.  Dental: Annual dental screenings help detect early signs of oral cancer, gum disease, and other conditions linked to overall health, including heart disease and diabetes.

## 2024-07-03 ENCOUNTER — Encounter: Payer: PPO | Admitting: Internal Medicine

## 2024-07-05 ENCOUNTER — Other Ambulatory Visit (HOSPITAL_COMMUNITY): Payer: Self-pay

## 2024-07-05 ENCOUNTER — Other Ambulatory Visit: Payer: Self-pay | Admitting: Family

## 2024-07-06 ENCOUNTER — Other Ambulatory Visit (HOSPITAL_COMMUNITY): Payer: Self-pay

## 2024-07-06 MED ORDER — ALPRAZOLAM 1 MG PO TABS
0.5000 mg | ORAL_TABLET | Freq: Four times a day (QID) | ORAL | 5 refills | Status: AC
  Filled 2024-07-06: qty 60, 30d supply, fill #0

## 2024-07-16 ENCOUNTER — Telehealth: Payer: Self-pay

## 2024-07-16 ENCOUNTER — Encounter: Admitting: Internal Medicine

## 2024-07-16 NOTE — Telephone Encounter (Signed)
 Called & unable to leave voicemail if Pt calls back please reschedule his appointment that was cancelled today.

## 2024-07-17 ENCOUNTER — Other Ambulatory Visit (HOSPITAL_BASED_OUTPATIENT_CLINIC_OR_DEPARTMENT_OTHER): Payer: Self-pay

## 2024-07-17 ENCOUNTER — Ambulatory Visit: Payer: Self-pay

## 2024-07-18 ENCOUNTER — Other Ambulatory Visit (HOSPITAL_COMMUNITY): Payer: Self-pay

## 2024-07-18 ENCOUNTER — Other Ambulatory Visit: Payer: Self-pay | Admitting: Internal Medicine

## 2024-07-18 MED ORDER — METFORMIN HCL ER 500 MG PO TB24
500.0000 mg | ORAL_TABLET | Freq: Every day | ORAL | 4 refills | Status: AC
  Filled 2024-07-18: qty 360, 90d supply, fill #0

## 2024-07-19 ENCOUNTER — Other Ambulatory Visit (HOSPITAL_COMMUNITY): Payer: Self-pay

## 2024-07-19 ENCOUNTER — Telehealth: Payer: Self-pay | Admitting: Internal Medicine

## 2024-07-19 ENCOUNTER — Other Ambulatory Visit: Payer: Self-pay

## 2024-07-19 MED ORDER — ROSUVASTATIN CALCIUM 10 MG PO TABS
10.0000 mg | ORAL_TABLET | Freq: Every day | ORAL | 3 refills | Status: AC
  Filled 2024-07-19: qty 90, 90d supply, fill #0

## 2024-07-19 NOTE — Telephone Encounter (Signed)
 Pt stated that he received a message from [place], Pharm-D back in December that he was non-compliant in taking his statin for his cholesterol. Pt stated that he was confused and assured me that he was getting and taking his medication.  Spoke with [place], Pharm-D and stated that they get a notice when a patient is not compliant on maintenance medications. Advised me to tell pt that it was from our office to check and see if there were any issues with getting and taking the medication. If everything is appropriate, there is no cause for alarm.  Spoke with pt and relayed information from [place], Pharm-D. Pt also stated that Dr. Faythe increased his Metformin  to 4 pills a day because his A1c level was high. Pt was told that this would mess his stomach up and he would like to wait to discuss with pcp.   Pt has upcoming appt on 07/31/24@220pm  with Dr. Rollene. Pt stated he will discuss at that time. Pt did not have any further concerns.

## 2024-07-19 NOTE — Telephone Encounter (Signed)
 LVM for pt to call office to address concerns.

## 2024-07-19 NOTE — Telephone Encounter (Signed)
 Copied from CRM [id number]. Topic: General - Other >> Jul 19, 2024 12:14 PM Joesph B wrote: Reason for CRM: patient returning call to speak to danielle.

## 2024-07-19 NOTE — Telephone Encounter (Signed)
 Spoke to pt. Please see notes in previous message.

## 2024-07-19 NOTE — Progress Notes (Unsigned)
 " Cardiology Office Note:    Date:  07/20/2024   ID:  [name], DOB October 04, 1950, MRN [id number]  PCP:  Rollene Almarie LABOR, MD   [facility] Providers Cardiologist:  Maude Emmer, MD { Click to update primary MD,subspecialty MD or APP then REFRESH:1}    Referring MD: Rollene Almarie LABOR, MD      History of Present Illness:   [name] is a 74 y.o. male with a hx of AAA s/p aortic dissection repair, COPD, T2DM, DVT, GERD, HLD, HTN, OSA, anemia presenting today for follow-up.  2013 patient sustained a type a ascending dissection, repaired with a prosthetic valve replacement using a hemashield graft that replaced ascending aorta and Hemi-arch reconstruction and resuspension of aortic valve.  Occurred with brachial plexus injury following axillary artery cannulation on the right side.  He followed with staged AAA repair by Dr. Gerlean in 10/2012.  CTA chest aorta 05/28/2020: Grossly stable penetrating atherosclerotic ulcer or pseudoaneurysm in the proximal portion of the graft currently measuring 3.0 X1.1 cm, continued presence of thrombosed dissection involving the middle and distal portions of the descending thoracic aorta, maximum measured aneurysmal dilatation of 4.2 cm which is not significantly changed compared to prior exam, dissection flap extends into the proximal portion of the left renal artery and left renal artery appears to be predominantly supplied by the false lumen, unchanged from prior exam.  This has been followed over the years by [facility]/CVTS.  Bilateral carotid Dopplers 04/05/2023: Bilateral ICA velocities consistent with a 1-39% stenosis.  Per Dr. Claiborne note 06/29/2023: Echo 02/17/23 EF 60-65% mild MR mild AS moderate AR mean gradient 10 mmHg CTA 07/14/22 stable tube graft repair ascending Ao, stable descending type 2 dissection with occluded false lumen.  There does appear to be a sequestered area of thrombus/calcification Behind the non coronary cusp  below the tube graft that is stable and may be related to the AV re-suspension and contributes to valve deformity and AR  Review of the operative note confirms that PVT re approximated the dissected layers of non coronary sinus with a felt strip and bioglue with re suspension of commissure and this is what is being called a penetrating ulcer   Patient was stable at this time.  Most recent CTA chest aorta 07/27/2023: Dissection flap extending through juxtarenal aorta, maximal aortic diameter unchanged at 4.3 cm, aortobifem bypass without evidence of complication, no flow limitation of the left renal artery.  Most recent echocardiogram 03/14/2024: LVEF 60-65%, normal LV function, no RWMA, mildly dilated, G2 DD.  Mildly elevated PASP.  Moderately dilated LA, mildly dilated RA, mild MV regurg, tricuspid regurg mild-moderate, AV sclerosis present, no evidence of AV stenosis, aortic root/ascending aorta has been repaired/replaced, no significant change from prior study.  Presents independently, appears stable from a cardiovascular standpoint.  He reports 3 months ago he noticed left-sided sharp chest pain/vibratory sensation that was coming and going, unrelated to exertion, no associated symptoms.  States that lasted for 1 week, then went away.  Has had no recurrence of this since.  He denies chest pain, shortness of breath, PND, orthopnea, palpitations, dizziness, lightheadedness, near-syncope, dark/tarry/bloody stools, hematuria, weight gain, edema.  Reports his blood pressures typically average 120s-130s/60s-70s at home.  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  Reports he exercises 2-3 times per week, he states he will have a reasonable exertional dyspnea that is expected when exercising, resolves with rest.  ROS:  Please see the history of present illness.    All other systems reviewed and are negative.     Past Medical History:  Diagnosis Date   AAA  (abdominal aortic aneurysm)    ALLERGIC RHINITIS    Anemia    post op, treated /w Fe   Anxiety    Arthritis    OA, hands, - thumbs- specifically   Brachial plexopathy    double crush injury with median nerve damage on the right dominant hand.    COPD (chronic obstructive pulmonary disease) (HCC)    also states he has been told that he has slight emphysema    Dissecting aortic aneurysm, thoracic (HCC) 02/20/2012   s/p emergent surgical repair   DM2 (diabetes mellitus, type 2) (HCC)    DVT (deep venous thrombosis) (HCC)    coumadin - x3 months , post op   ERECTILE DYSFUNCTION, ORGANIC    GERD (gastroesophageal reflux disease)    related to diet & anxiety    HYPERLIPIDEMIA    Hypertension    pt. followwed by Dr. Delford   MIGRAINE HEADACHE    induced from physical , emotional stress , but sometimes occur randomly, also experiences BPPV   OBSTRUCTIVE SLEEP APNEA 02/2008 sleep study   moderate & central- not able to tolerate CPAP    Thyroid  nodule 04/21/2015   2.3 cm on right, incidental finding on 03/2015 CT chest - f/u US  ordered   Unspecified vitamin D  deficiency     Past Surgical History:  Procedure Laterality Date   ABDOMINAL AORTIC ANEURYSM REPAIR N/A 11/01/2012   Procedure: ANEURYSM ABDOMINAL AORTIC REPAIR;  Surgeon: Lynwood JONETTA Collum, MD;  Location: [facility] OR;  Service: Vascular;  Laterality: N/A;  Resection and Grafting of Abdominal Aortic Aneurysm,(AORTA BI ILIAC)   bone implant oral     Nasal fx without repair     THORACIC AORTIC ANEURYSM REPAIR  02/20/2012   Procedure: THORACIC ASCENDING ANEURYSM REPAIR (AAA);  Surgeon: Maude Fleeta Ochoa, MD;  Location: [facility] OR;  Service: Open Heart Surgery;  Laterality: N/A;    Current Medications: Active Medications[1]   Allergies:   Alpha-gal, Testosterone , Codeine, Dapagliflozin , Gabapentin , Glycerin, Heparin , Other, Ultram  [tramadol ], and Albuterol    Social History   Socioeconomic History   Marital status: Divorced    Spouse name: Not on  file   Number of children: 2   Years of education: acad. deg.   Highest education level: Master's degree (e.g., MA, MS, MEng, MEd, MSW, MBA)  Occupational History   Occupation: internal med resident    Employer: [employer]   Occupation: RETIRED  Tobacco Use   Smoking status: Former    Current packs/day: 0.00    Types: Cigarettes    Quit date: 02/18/2012    Years since quitting: 12.4   Smokeless tobacco: Never   Tobacco comments:    10 cigs/day, quit 02/2012 after TAA emergent repair  Vaping Use   Vaping status: Never Used  Substance and Sexual Activity   Alcohol use: Not Currently   Drug use: No   Sexual activity: Not Currently  Other Topics Concern   Not on file  Social History Narrative   Patient is single and lives at home alone.    Financial controller.   Patient works at [employer] full time.   Right handed.   Caffeine one cup of coffee daily.    Social Drivers of Health   Tobacco Use: Medium Risk (07/20/2024)   Patient History    Smoking Tobacco Use:  Former    Smokeless Tobacco Use: Never    Passive Exposure: Not on Actuary Strain: Low Risk (06/26/2024)   Overall Financial Resource Strain (CARDIA)    Difficulty of Paying Living Expenses: Not very hard  Food Insecurity: No Food Insecurity (06/27/2024)   Epic    Worried About Programme Researcher, Broadcasting/film/video in the Last Year: Never true    Ran Out of Food in the Last Year: Never true  Transportation Needs: No Transportation Needs (06/27/2024)   Epic    Lack of Transportation (Medical): No    Lack of Transportation (Non-Medical): No  Physical Activity: Insufficiently Active (06/27/2024)   Exercise Vital Sign    Days of Exercise per Week: 2 days    Minutes of Exercise per Session: 30 min  Stress: Stress Concern Present (06/27/2024)   [employer] of Occupational Health - Occupational Stress Questionnaire    Feeling of Stress: Rather much  Social Connections: Socially Isolated (06/27/2024)   Social Connection  and Isolation Panel    Frequency of Communication with Friends and Family: Twice a week    Frequency of Social Gatherings with Friends and Family: Once a week    Attends Religious Services: Never    Database Administrator or Organizations: No    Attends Banker Meetings: Never    Marital Status: Divorced  Depression (PHQ2-9): Low Risk (06/27/2024)   Depression (PHQ2-9)    PHQ-2 Score: 1  Alcohol Screen: Low Risk (06/26/2024)   Alcohol Screen    Last Alcohol Screening Score (AUDIT): 2  Housing: Low Risk (06/27/2024)   Epic    Unable to Pay for Housing in the Last Year: No    Number of Times Moved in the Last Year: 0    Homeless in the Last Year: No  Utilities: Not At Risk (06/27/2024)   Epic    Threatened with loss of utilities: No  Health Literacy: Adequate Health Literacy (06/27/2024)   B1300 Health Literacy    Frequency of need for help with medical instructions: Never     Family History: The patient's family history includes Deep vein thrombosis in his father; Dementia in his mother; Diabetes in his mother; Heart disease in his cousin, maternal grandmother, maternal uncle, and paternal uncle; Lung cancer in his cousin; Stroke in his mother; Varicose Veins in his father.  EKGs/Labs/Other Studies Reviewed:    The following studies were reviewed today:  EKG Interpretation Date/Time:  Friday July 20 2024 15:28:10 EST Ventricular Rate:  63 PR Interval:  182 QRS Duration:  88 QT Interval:  384 QTC Calculation: 392 R Axis:   -4  Text Interpretation: Sinus rhythm with occasional Premature ventricular complexes Confirmed by Legend Pecore [phone]) on 07/20/2024 4:32:30 PM    Recent Labs: 11/08/2023: ALT 36; BUN 18; Creatinine, Ser 0.80; Hemoglobin 14.9; Magnesium  1.8; Platelets 153.0; Potassium 4.2; Sodium 134  Recent Lipid Panel    Component Value Date/Time   CHOL 122 06/27/2023 1509   TRIG 102.0 06/27/2023 1509   TRIG 181 06/20/2008 0000   HDL 38.40 (L)  06/27/2023 8490   CHOLHDL 3 06/27/2023 1509   VLDL 20.4 06/27/2023 1509   LDLCALC 64 06/27/2023 1509   LDLCALC 36 06/20/2008 0000   LDLDIRECT 114 (H) 03/21/2010 0104     Risk Assessment/Calculations:   {Does this patient have ATRIAL FIBRILLATION?:[id number]}            Physical Exam:    VS:  BP (!) 128/58   Pulse 62  Ht 6' 4 (1.93 m)   Wt 226 lb (102.5 kg)   SpO2 97%   BMI 27.51 kg/m        Wt Readings from Last 3 Encounters:  07/20/24 226 lb (102.5 kg)  06/27/24 225 lb (102.1 kg)  05/31/24 225 lb (102.1 kg)     GEN: *** Well nourished, well developed in no acute distress HEENT: Normal NECK: No JVD; No carotid bruits CARDIAC: *** S1-S2 normal, RRR, no murmurs, rubs, gallops RESPIRATORY:  Clear to auscultation without rales, wheezing or rhonchi  MUSCULOSKELETAL:  No edema; No deformity  SKIN: Warm and dry NEUROLOGIC:  Alert and oriented x 3 PSYCHIATRIC:  Normal affect       Assessment & Plan Aortic valve insufficiency, etiology of cardiac valve disease unspecified   Disposition: *** Route to primary cardiologist      Informed Consent   Shared Decision Making/Informed Consent{ All outpatient stress tests require an informed consent ([id number]) ATTESTATION ORDER       :[id number]} The risks [chest pain, shortness of breath, cardiac arrhythmias, dizziness, blood pressure fluctuations, myocardial infarction, stroke/transient ischemic attack, nausea, vomiting, allergic reaction, radiation exposure, metallic taste sensation and life-threatening complications (estimated to be 1 in 10,000)], benefits (risk stratification, diagnosing coronary artery disease, treatment guidance) and alternatives of a cardiac PET stress test were discussed in detail with Mr. Brallier and he agrees to proceed.       Medication Adjustments/Labs and Tests Ordered: Current medicines are reviewed at length with the patient today.  Concerns regarding medicines are outlined above.  Orders  Placed This Encounter  Procedures   EKG 12-Lead   EKG 12-Lead   VAS US  CAROTID   No orders of the defined types were placed in this encounter.   Patient Instructions  Medication Instructions:  Your physician recommends that you continue on your current medications as directed. Please refer to the Current Medication list given to you today. *If you need a refill on your cardiac medications before your next appointment, please call your pharmacy*  Lab Work: None ordered If you have labs (blood work) drawn today and your tests are completely normal, you will receive your results only by: MyChart Message (if you have MyChart) OR A paper copy in the mail If you have any lab test that is abnormal or we need to change your treatment, we will call you to review the results.  Testing/Procedures: Your physician has requested that you have a carotid duplex. This test is an ultrasound of the carotid arteries in your neck. It looks at blood flow through these arteries that supply the brain with blood. Allow one hour for this exam. There are no restrictions or special instructions.  Follow-Up: At [facility], you and your health needs are our priority.  As part of our continuing mission to provide you with exceptional heart care, our providers are all part of one team.  This team includes your primary Cardiologist (physician) and Advanced Practice Providers or APPs (Physician Assistants and Nurse Practitioners) who all work together to provide you with the care you need, when you need it.  Your next appointment:   6 month(s)  Provider:   Maude Emmer, MD    We recommend signing up for the patient portal called MyChart.  Sign up information is provided on this After Visit Summary.  MyChart is used to connect with patients for Virtual Visits (Telemedicine).  Patients are able to view lab/test results, encounter notes, upcoming appointments, etc.  Non-urgent messages  can be sent to your  provider as well.   To learn more about what you can do with MyChart, go to [email or web address].   Other Instructions             Signed, Nasire Reali E Sachin Ferencz, NP  07/20/2024 4:37 PM    [facility]     [1]  Current Meds  Medication Sig   ALPRAZolam  (XANAX ) 1 MG tablet Take 1/2 tablet (0.5 mg total) by mouth 4 (four) times daily.   aspirin  EC 81 MG tablet Take 81 mg by mouth daily with lunch.   azelastine  (ASTELIN ) 0.1 % nasal spray Place 2 sprays into both nostrils 2 (two) times daily. Office visit is due   calcium  carbonate (TUMS EX) 750 MG chewable tablet Chew 1 tablet by mouth daily as needed for heartburn.    Cetirizine HCl (ZYRTEC PO) Take by mouth. As needed   cholecalciferol  (VITAMIN D ) 1000 UNITS tablet Take 2,000 Units by mouth daily.   EPINEPHrine  0.3 mg/0.3 mL IJ SOAJ injection Inject 0.3 mg into the muscle once as directed.   fexofenadine (ALLEGRA) 180 MG tablet Take 90 mg by mouth 2 (two) times daily.   fluticasone  (FLONASE ) 50 MCG/ACT nasal spray Place 2 sprays into both nostrils daily.   hydrochlorothiazide  (HYDRODIURIL ) 25 MG tablet Take 1 tablet (25 mg total) by mouth daily.   metFORMIN  (GLUCOPHAGE -XR) 500 MG 24 hr tablet Take 4 tablets (2,000mg  total) by mouth daily, with a meal.   metoprolol  tartrate (LOPRESSOR ) 25 MG tablet Take 1 tablet (25 mg total) by mouth 3 (three) times daily.   montelukast  (SINGULAIR ) 10 MG tablet Take 1/2 tablet (5 mg total) by mouth 2 (two) times daily.   Multiple Vitamin (MULTIVITAMIN WITH MINERALS) TABS Take 1 tablet by mouth daily.   omega-3 acid ethyl esters (LOVAZA ) 1 g capsule Take 1 capsule (1 g total) by mouth 2 (two) times daily.   OVER THE COUNTER MEDICATION Apply 1 application topically daily as needed (for acne). Oxy 10 vanishing cream   polyethylene glycol powder (GLYCOLAX /MIRALAX ) 17 GM/SCOOP powder Take 17 g (1 capful) by mouth daily.   predniSONE  (DELTASONE ) 5 MG tablet Take as directed.   PREVIDENT 5000 DRY  MOUTH 1.1 % GEL dental gel APPLY THIN RIBBON AND BRUSH FOR 2 MINUTES DAILY IN PLACE OF CONVENTIONAL TOOTHPASTE   rosuvastatin  (CRESTOR ) 10 MG tablet Take 1 tablet (10 mg total) by mouth daily.   sodium fluoride  (PREVIDENT 5000 DRY MOUTH) 1.1 % GEL dental gel Apply a thin ribbon or pea-sized amount to toothbrush and brush teeth thoroughly for 2 minutes. Use daily in place of regular toothpaste.   thiamine (VITAMIN B-1) 100 MG tablet Take 100 mg by mouth 2 (two) times daily at 10 AM and 5 PM.    tiZANidine  (ZANAFLEX ) 2 MG tablet Take 1 tablet (2 mg total) by mouth every 6 (six) hours as needed for muscle spasms.   triamcinolone  cream (KENALOG ) 0.1 % Apply 1 Application topically 2 (two) times daily. Apply to itchy areas as needed. Taper as able   triamcinolone  cream (KENALOG ) 0.1 % Apply 1 Application topically to affected area 2 (two) times daily as directed.   TURMERIC CURCUMIN PO Take by mouth.   vitamin B-12 (CYANOCOBALAMIN ) 500 MCG tablet Take 500-1,000 mcg by mouth daily.    "

## 2024-07-20 ENCOUNTER — Ambulatory Visit: Admitting: Emergency Medicine

## 2024-07-20 ENCOUNTER — Encounter: Payer: Self-pay | Admitting: Emergency Medicine

## 2024-07-20 VITALS — BP 128/58 | HR 62 | Ht 76.0 in | Wt 226.0 lb

## 2024-07-20 DIAGNOSIS — I351 Nonrheumatic aortic (valve) insufficiency: Secondary | ICD-10-CM

## 2024-07-20 NOTE — Patient Instructions (Signed)
 Medication Instructions:  Your physician recommends that you continue on your current medications as directed. Please refer to the Current Medication list given to you today. *If you need a refill on your cardiac medications before your next appointment, please call your pharmacy*  Lab Work: None ordered If you have labs (blood work) drawn today and your tests are completely normal, you will receive your results only by: MyChart Message (if you have MyChart) OR A paper copy in the mail If you have any lab test that is abnormal or we need to change your treatment, we will call you to review the results.  Testing/Procedures: Your physician has requested that you have a carotid duplex. This test is an ultrasound of the carotid arteries in your neck. It looks at blood flow through these arteries that supply the brain with blood. Allow one hour for this exam. There are no restrictions or special instructions.  Follow-Up: At [facility], you and your health needs are our priority.  As part of our continuing mission to provide you with exceptional heart care, our providers are all part of one team.  This team includes your primary Cardiologist (physician) and Advanced Practice Providers or APPs (Physician Assistants and Nurse Practitioners) who all work together to provide you with the care you need, when you need it.  Your next appointment:   6 month(s)  Provider:   Maude Emmer, MD    We recommend signing up for the patient portal called MyChart.  Sign up information is provided on this After Visit Summary.  MyChart is used to connect with patients for Virtual Visits (Telemedicine).  Patients are able to view lab/test results, encounter notes, upcoming appointments, etc.  Non-urgent messages can be sent to your provider as well.   To learn more about what you can do with MyChart, go to [email or web address].   Other Instructions

## 2024-07-25 ENCOUNTER — Other Ambulatory Visit (HOSPITAL_COMMUNITY)

## 2024-07-31 ENCOUNTER — Encounter: Admitting: Internal Medicine

## 2024-08-08 ENCOUNTER — Ambulatory Visit: Admitting: Surgery

## 2024-08-23 ENCOUNTER — Ambulatory Visit: Admitting: Neurology

## 2024-12-06 ENCOUNTER — Ambulatory Visit: Admitting: Neurology

## 2025-05-21 ENCOUNTER — Ambulatory Visit: Admitting: Allergy & Immunology

## 2025-06-27 ENCOUNTER — Ambulatory Visit
